# Patient Record
Sex: Male | Born: 1950 | Race: White | Hispanic: No | Smoking: Current every day smoker
Health system: Southern US, Community
[De-identification: ages and names within clinical notes are randomized; demographics above are authoritative.]

## PROBLEM LIST (undated history)

## (undated) DIAGNOSIS — E119 Type 2 diabetes mellitus without complications: Secondary | ICD-10-CM

## (undated) DIAGNOSIS — G709 Myoneural disorder, unspecified: Secondary | ICD-10-CM

## (undated) DIAGNOSIS — M1990 Unspecified osteoarthritis, unspecified site: Secondary | ICD-10-CM

## (undated) DIAGNOSIS — J449 Chronic obstructive pulmonary disease, unspecified: Secondary | ICD-10-CM

## (undated) DIAGNOSIS — Z972 Presence of dental prosthetic device (complete) (partial): Secondary | ICD-10-CM

## (undated) DIAGNOSIS — E785 Hyperlipidemia, unspecified: Secondary | ICD-10-CM

## (undated) DIAGNOSIS — I639 Cerebral infarction, unspecified: Secondary | ICD-10-CM

## (undated) DIAGNOSIS — I10 Essential (primary) hypertension: Secondary | ICD-10-CM

## (undated) DIAGNOSIS — Z973 Presence of spectacles and contact lenses: Secondary | ICD-10-CM

## (undated) DIAGNOSIS — L03116 Cellulitis of left lower limb: Secondary | ICD-10-CM

## (undated) DIAGNOSIS — G4730 Sleep apnea, unspecified: Secondary | ICD-10-CM

## (undated) DIAGNOSIS — K529 Noninfective gastroenteritis and colitis, unspecified: Secondary | ICD-10-CM

## (undated) DIAGNOSIS — M869 Osteomyelitis, unspecified: Secondary | ICD-10-CM

## (undated) DIAGNOSIS — R0600 Dyspnea, unspecified: Secondary | ICD-10-CM

## (undated) DIAGNOSIS — G629 Polyneuropathy, unspecified: Secondary | ICD-10-CM

## (undated) DIAGNOSIS — Z89511 Acquired absence of right leg below knee: Secondary | ICD-10-CM

## (undated) DIAGNOSIS — I739 Peripheral vascular disease, unspecified: Secondary | ICD-10-CM

## (undated) DIAGNOSIS — M069 Rheumatoid arthritis, unspecified: Secondary | ICD-10-CM

## (undated) HISTORY — DX: Myoneural disorder, unspecified: G70.9

## (undated) HISTORY — DX: Noninfective gastroenteritis and colitis, unspecified: K52.9

## (undated) HISTORY — DX: Cellulitis of left lower limb: L03.116

## (undated) HISTORY — PX: COLONOSCOPY: SHX174

## (undated) HISTORY — DX: Rheumatoid arthritis, unspecified: M06.9

## (undated) HISTORY — PX: LUMBAR DISC SURGERY: SHX700

## (undated) HISTORY — DX: Hyperlipidemia, unspecified: E78.5

## (undated) HISTORY — DX: Acquired absence of right leg below knee: Z89.511

## (undated) HISTORY — DX: Unspecified osteoarthritis, unspecified site: M19.90

## (undated) HISTORY — PX: BACK SURGERY: SHX140

## (undated) HISTORY — DX: Osteomyelitis, unspecified: M86.9

---

## 2003-03-05 HISTORY — PX: SCAR REVISION: SHX5285

## 2003-03-05 HISTORY — PX: LEG AMPUTATION BELOW KNEE: SHX694

## 2003-03-05 HISTORY — PX: TOE AMPUTATION: SHX809

## 2003-03-05 HISTORY — PX: FOOT AMPUTATION: SHX951

## 2003-10-16 ENCOUNTER — Emergency Department (HOSPITAL_COMMUNITY): Admission: EM | Admit: 2003-10-16 | Discharge: 2003-10-16 | Payer: Self-pay | Admitting: Emergency Medicine

## 2003-11-10 ENCOUNTER — Inpatient Hospital Stay (HOSPITAL_COMMUNITY): Admission: EM | Admit: 2003-11-10 | Discharge: 2003-11-15 | Payer: Self-pay | Admitting: Family Medicine

## 2003-12-06 ENCOUNTER — Ambulatory Visit: Payer: Self-pay | Admitting: *Deleted

## 2003-12-07 ENCOUNTER — Encounter: Admission: RE | Admit: 2003-12-07 | Discharge: 2003-12-07 | Payer: Self-pay | Admitting: Orthopedic Surgery

## 2003-12-13 ENCOUNTER — Emergency Department (HOSPITAL_COMMUNITY): Admission: EM | Admit: 2003-12-13 | Discharge: 2003-12-13 | Payer: Self-pay | Admitting: Family Medicine

## 2003-12-14 ENCOUNTER — Encounter (INDEPENDENT_AMBULATORY_CARE_PROVIDER_SITE_OTHER): Payer: Self-pay | Admitting: *Deleted

## 2003-12-14 ENCOUNTER — Inpatient Hospital Stay (HOSPITAL_COMMUNITY): Admission: RE | Admit: 2003-12-14 | Discharge: 2003-12-17 | Payer: Self-pay | Admitting: Orthopedic Surgery

## 2004-01-05 ENCOUNTER — Encounter (INDEPENDENT_AMBULATORY_CARE_PROVIDER_SITE_OTHER): Payer: Self-pay | Admitting: Specialist

## 2004-01-05 ENCOUNTER — Inpatient Hospital Stay (HOSPITAL_COMMUNITY): Admission: RE | Admit: 2004-01-05 | Discharge: 2004-01-09 | Payer: Self-pay | Admitting: Orthopedic Surgery

## 2004-02-01 ENCOUNTER — Inpatient Hospital Stay (HOSPITAL_COMMUNITY): Admission: RE | Admit: 2004-02-01 | Discharge: 2004-02-03 | Payer: Self-pay | Admitting: Orthopedic Surgery

## 2004-02-21 ENCOUNTER — Ambulatory Visit: Payer: Self-pay | Admitting: Nurse Practitioner

## 2004-03-07 ENCOUNTER — Observation Stay (HOSPITAL_COMMUNITY): Admission: RE | Admit: 2004-03-07 | Discharge: 2004-03-09 | Payer: Self-pay | Admitting: Orthopedic Surgery

## 2004-03-29 ENCOUNTER — Ambulatory Visit: Payer: Self-pay | Admitting: Nurse Practitioner

## 2004-05-16 ENCOUNTER — Encounter: Admission: RE | Admit: 2004-05-16 | Discharge: 2004-06-11 | Payer: Self-pay | Admitting: Orthopedic Surgery

## 2005-07-26 ENCOUNTER — Emergency Department (HOSPITAL_COMMUNITY): Admission: EM | Admit: 2005-07-26 | Discharge: 2005-07-26 | Payer: Self-pay | Admitting: Family Medicine

## 2005-09-13 ENCOUNTER — Ambulatory Visit: Payer: Self-pay | Admitting: Gastroenterology

## 2005-09-27 ENCOUNTER — Ambulatory Visit: Payer: Self-pay | Admitting: Gastroenterology

## 2005-10-08 ENCOUNTER — Ambulatory Visit: Payer: Self-pay | Admitting: Gastroenterology

## 2006-01-22 ENCOUNTER — Encounter: Admission: RE | Admit: 2006-01-22 | Discharge: 2006-01-22 | Payer: Self-pay | Admitting: Internal Medicine

## 2006-02-18 ENCOUNTER — Encounter (INDEPENDENT_AMBULATORY_CARE_PROVIDER_SITE_OTHER): Payer: Self-pay | Admitting: Cardiology

## 2006-02-18 ENCOUNTER — Ambulatory Visit (HOSPITAL_COMMUNITY): Admission: RE | Admit: 2006-02-18 | Discharge: 2006-02-18 | Payer: Self-pay | Admitting: Internal Medicine

## 2008-07-08 ENCOUNTER — Emergency Department (HOSPITAL_COMMUNITY): Admission: EM | Admit: 2008-07-08 | Discharge: 2008-07-08 | Payer: Self-pay | Admitting: Family Medicine

## 2008-11-09 ENCOUNTER — Encounter (HOSPITAL_BASED_OUTPATIENT_CLINIC_OR_DEPARTMENT_OTHER): Admission: RE | Admit: 2008-11-09 | Discharge: 2009-02-07 | Payer: Self-pay | Admitting: Internal Medicine

## 2010-07-20 NOTE — Op Note (Signed)
NAMEBALRAJ, BRAYFIELD              ACCOUNT NO.:  [id number]   MEDICAL RECORD NO.:  [id number]          PATIENT TYPE:  INP   LOCATION:  2550                         FACILITY:  [facility]   PHYSICIAN:  [name], MD     DATE OF BIRTH:  June 21, 1950   DATE OF PROCEDURE:  02/01/2004  DATE OF DISCHARGE:                                 OPERATIVE REPORT   PREOPERATIVE DIAGNOSIS:  Dehiscence right below-knee amputation.   POSTOPERATIVE DIAGNOSIS:  Dehiscence right below-knee amputation.   OPERATION PERFORMED:  Right below-knee amputation.   SURGEON:  [name], M.D.   ANESTHESIA:  General.   ESTIMATED BLOOD LOSS:  Minimal.   ANTIBIOTICS:  1g Kefzol.   TOURNIQUET TIME:  None.   DISPOSITION:  To post anesthesia care unit in stable condition.   INDICATIONS FOR PROCEDURE:  The patient is a 60 year old gentleman with  peripheral vascular disease, type 2 diabetes, who presents four weeks status  post a right below-knee amputation.  The patient had a good healing and good  consolidation; however, over the weekend, his wound had started to dehisce  and broke down and has exposed tibia and he presents at this time for  revision below-knee amputation. The risks and benefits were discussed  including infection, neurovascular injury, nonhealing of the wound, need for  an above-knee amputation.  The patient states he understands and wishes to  proceed at this time.   DESCRIPTION OF PROCEDURE:  The patient was brought to operating room 4 and  underwent general anesthetic.  After adequate level of anesthesia obtained,  the patient's right lower extremity was prepped using DuraPrep and draped  into a sterile field.  Approximately one inch of the skin was ellipsed out  around the previous incision and this was ellipsed out back to healthy  viable bleeding muscle.  One inch of the distal tibia was resected and one  inch of the distal fibula was resected.  The muscle had good color, good  contractility and good consistency.  All nonviable muscle was removed.  There was no evidence of an abscess or infection.  The wound was irrigated  with normal saline.  Hemostasis was obtained.  The fascial layers were  closed sequentially.  The deep fascial layers were closed using #1 PDS.  The  superficial fascial layers were closed using #1 PDS. The skin was closed  using Proximate staples.  There was no tension on the wound edges.  The  wound was covered with Adaptic orthopedic sponges and a  compressive Ivette Loyal dressing was applied.  The patient was extubated  and taken to PACU in stable condition.  Hemostasis was obtained.  The wound was closed using 2-0 nylon.  The wound  was covered with Adaptic orthopedic sponges, Kerlix and a Coban dressing.  The patient was then taken to PACU in stable condition.      [name]   MVD/MEDQ  D:  02/01/2004  T:  02/01/2004  Job:  [id number]

## 2010-07-20 NOTE — Discharge Summary (Signed)
NAME:  Brian Zavala, Brian Zavala NO.:  [id number]   MEDICAL RECORD NO.:  [id number]                   PATIENT TYPE:  INP   LOCATION:  5036                                 FACILITY:  [facility]   PHYSICIAN:  Michaelyn Barter, M.D.              DATE OF BIRTH:  [birth year]   DATE OF ADMISSION:  11/10/2003  DATE OF DISCHARGE:  11/15/2003                                 DISCHARGE SUMMARY   CHIEF COMPLAINT:  Blister on right foot.   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  He presented to the urgent  care complaining of a blister that developed over his right great toe.  This  was also accompanied by erythema over the right foot.  He denied having any  fever or chills.  He was admitted into the hospital with a diagnosis of  diabetic foot ulcer.  He was started on empiric antibiotics via IV, namely  Zosyn.  An MRI was completed to rule out osteomyelitis.  The final  impression of the MRI, which was completed on November 07, 2003, is there is  a skin nodule which may be some type of blister.  There is surrounding  cellulitis without evidence of osteomyelitis or myofasciitis and no discrete  deep soft tissue abscess is seen.  Therefore, the patient was continued on  Zosyn throughout the course of his hospitalization.  Over the course of his  hospitalization the right foot improved dramatically.  The redness gradually  resolved.  The foot was dressed daily by the nursing staff and his  cellulitis appeared to improve.  Over the course of the patient's  hospitalization, the patient had at least one episode of early morning  hypoglycemia, therefore, the decision was made to hold the patient's  Glucophage.  The patient has been instructed that he should followup with  his primary care physician over the next couple of days for a  re-evaluation  of his sugars and at that time his Glucophage can be reinstituted if  necessary.  At time of discharge, the patient's condition is much improved  in comparison to admission.  Again, his cellulitis of the right foot has  improved remarkably.  He currently has no complaints.  His vitals today:  His temperature is 97.7, heart rate 92, respirations 18, blood pressure  115/72, and his last four CBGs were 77, 117, 96 and 90.  The patient will be  discharged home on the following medications:  1.  Topamax 100 mg 1 tablet twice daily.  2.  Cymbalta 30 mg 1 tablet daily.  3.  Irbesartan 300 mg 1 tablet daily.  4.  Amaryl 4 mg 1 tablet daily.  5.  Ambien 10 mg 1 tablet at bedtime.  6.  Augmentin 875 mg 1 tablet q.12 hours.  7.  Prednisone 2 mg 1 tablet daily.   Again, the patient has been instructed to see his primary care physician,  his appointment is scheduled for Thursday, November 17, 2003, at 9:15 a.m.  Because his sugars were low at certain times throughout his hospitalization,  his Glucophage will be held.  He is instructed to check his sugars at least  three times daily and to consume glucose in the form of juice or other  glucose-containing substance if his sugars are very low.                                                Michaelyn Barter, M.D.    OR/MEDQ  D:  11/15/2003  T:  11/15/2003  Job:  [id number]

## 2010-07-20 NOTE — Discharge Summary (Signed)
NAMECLEOFAS, HUDGINS              ACCOUNT NO.:  [id number]   MEDICAL RECORD NO.:  [id number]          PATIENT TYPE:  INP   LOCATION:  5004                         FACILITY:  [facility]   PHYSICIAN:  [name], MD     DATE OF BIRTH:  05-01-1950   DATE OF ADMISSION:  01/05/2004  DATE OF DISCHARGE:  01/09/2004                                 DISCHARGE SUMMARY   DIAGNOSIS:  Ischemic necrotic right foot.   PROCEDURE:  Right below knee amputation.   DISPOSITION:  Discharged to home in stable condition with home health  physical therapy.   DISCHARGE MEDICATIONS:  Prescriptions for Tylox and Flexeril.   FOLLOW UP:  Follow up in the office two weeks after discharge.   HISTORY OF PRESENT ILLNESS:  Patient is a 60 year old gentleman with  progressive ischemic necrosis of his right foot.  Patient has failed  conservative care and failed foot salvage and presents at this time for a  right below knee amputation.   HOSPITAL COURSE:  The patient's hospital course was essentially  unremarkable.  He underwent a right below knee amputation on January 05, 2004.  He received Kefzol for infection prophylaxis and his tourniquet time  was 10 minutes.  Postoperatively, the patient progressed well with physical  therapy.  He had good range of motion of his knee and he was discharged to  home in stable condition with home health physical therapy on January 09, 2004, with follow-up in the office two weeks after discharge.      [name]   MVD/MEDQ  D:  02/14/2004  T:  02/14/2004  Job:  [id number]

## 2010-07-20 NOTE — Discharge Summary (Signed)
Brian Zavala, Brian Zavala              ACCOUNT NO.:  [id number]   MEDICAL RECORD NO.:  [id number]          PATIENT TYPE:  INP   LOCATION:  5001                         FACILITY:  [facility]   PHYSICIAN:  [name], MD     DATE OF BIRTH:  [birth year]   DATE OF ADMISSION:  12/14/2003  DATE OF DISCHARGE:  12/17/2003                                 DISCHARGE SUMMARY   DISCHARGE DIAGNOSIS:  Abscess and osteomyelitis, right forefoot.   PROCEDURE:  Right first and second ray amputations.   DISPOSITION/CONDITION ON DISCHARGE:  Discharged to home in stable condition.   DISCHARGE MEDICATIONS:  1.  Omnicef antibiotic.  2.  Vicodin for pain.   FOLLOWUP:  Plan to follow up in the office in one week.   HISTORY OF PRESENT ILLNESS:  The patient is a 60 year old gentleman with  diabetic insensate neuropathy and a purulent abscess of the right first and  second rays.  He has failed conservative care with p.o. antibiotics, wound  care and pressure who presents at this time for a first and second ray  amputations.   HOSPITAL COURSE:  The patient's hospital course was essentially  unremarkable.  He underwent a right first and second ray amputation on  December 14, 2003, with an Esmarch tourniquet time at the ankle for  approximately 20 minutes.  He received vancomycin 1 g IV.  Cultures were  obtained x2.  Discharged to the PACU in stable condition.  The patient progressed well.  He was able to ambulate non-weightbearing with  physical therapy.  He was discharged to home with [facility] antibiotic and  physical therapy, for progressive ambulation, non-weightbearing on the right  .  I will plan to follow up in the office in one week after discharge.      [name]   MVD/MEDQ  D:  02/14/2004  T:  02/14/2004  Job:  [id number]

## 2010-07-20 NOTE — Op Note (Signed)
Brian Zavala, Brian Zavala              ACCOUNT NO.:  [id number]   MEDICAL RECORD NO.:  [id number]          PATIENT TYPE:  INP   LOCATION:  5004                         FACILITY:  [facility]   PHYSICIAN:  [name], MD     DATE OF BIRTH:  04/19/1950   DATE OF PROCEDURE:  01/05/2004  DATE OF DISCHARGE:                                 OPERATIVE REPORT   PREOPERATIVE DIAGNOSIS:  Ischemic necrosis of the right foot status post  partial foot amputation.   POSTOPERATIVE DIAGNOSIS:  Ischemic necrosis of the right foot status post  partial foot amputation.   PROCEDURE:  Right below the knee amputation.   SURGEON:  [name], M.D.   ANESTHESIA:  LMA.   ESTIMATED BLOOD LOSS:  Minimal.   ANTIBIOTICS:  1 gram Kefzol.   TOURNIQUET TIME:  10 minutes at 300 mmHg.   DISPOSITION:  To the PACU in stable condition.   INDICATIONS FOR PROCEDURE:  The patient is a 60 year old gentleman type 2  diabetic who has had progressive ischemic necrosis of his right forefoot  status post forefoot partial foot amputation.  The patient has undergone  wound care with progressive ischemic necrosis of the forefoot.  The patient  was discussed surgical options including a continued foot salvage with  additional partial foot amputation versus below the knee amputation.  The  patient states that he wishes to proceed with below the knee amputation and  presents at this time for a right BKA.  The risks and benefits were  discussed including infection, neurovascular injury, persistent pain, need  for additional surgery, nonhealing of the wound.  The patient states he  understands and wishes to proceed at this time.   DESCRIPTION OF PROCEDURE:  The patient was brought to OR room 2 and  underwent a general LMA anesthetic.  After an adequate level of anesthesia  was obtained, the patient's right lower extremity was prepped using  DuraPrep, draped in a sterile field, and the foot was draped into an  impervious  stockinette.  The leg was elevated and the tourniquet inflated  about the thigh to 300 mmHg.  A transverse incision was made at 10 cm distal  to the tibial tubercle, this was carried proximally and a large posterior  flap was created.  The tibia was transected 1 cm proximal to the skin  incision and the fibula was transected 1 cm proximal to the tibial incision.  The soft tissue was then created with a large posterior flap using the  amputation knife.  The sciatic nerve was pulled, cut, and allowed to  retract.  The vascular bundles were suture ligated x 3 each with 2-0 silk.  The tourniquet was deflated after ten  minutes and hemostasis was obtained.  The deep and superficial fascial  layers were closed using #1 PDS.  The skin was closed using approximate  staples.  The wound was covered with Adaptic, orthopedic sponges, sterile  Webril, and a Coban dressing.  The patient was extubated and taken to the  PACU in stable condition.       MVD/MEDQ  D:  01/05/2004  T:  01/05/2004  Job:  [id number]

## 2010-07-20 NOTE — Op Note (Signed)
NAMEELRIC, Brian Zavala              ACCOUNT NO.:  [id number]   MEDICAL RECORD NO.:  [id number]          PATIENT TYPE:  INP   LOCATION:  2899                         FACILITY:  [facility]   PHYSICIAN:  [name], MD     DATE OF BIRTH:  [birth year]   DATE OF PROCEDURE:  03/07/2004  DATE OF DISCHARGE:                                 OPERATIVE REPORT   PREOPERATIVE DIAGNOSIS:  Dehiscent right below-the-knee amputation.   POSTOPERATIVE DIAGNOSIS:  Dehiscent right below-the-knee amputation.   PROCEDURE:  Right below-the-knee amputation revision.   SURGEON:  [name], M.D.   ANESTHESIA:  General.   ESTIMATED BLOOD LOSS:  Minimal.   ANTIBIOTICS:  Kefzol 1 g.   TOURNIQUET TIME:  None.   DISPOSITION:  To PACU in stable condition.  Plan for 23 hours' observation.   INDICATIONS FOR PROCEDURE:  The patient is a 60 year old gentleman who is  status post a right BKA.  The patient has had a wound breakdown and  subsequent dehiscence of the wound, and presents at this time for revision,  right below-the-knee amputation.  The risks and benefits were discussed,  including infection, neurovascular injury, persistent pain, need for higher  level amputation.  The patient stated he understands and wishes to proceed  at this time.   DESCRIPTION OF PROCEDURE:  The patient was brought to the OR room #5 and  underwent a general anesthetic. After an adequate level of anesthesia was  obtained, the patient's right lower extremity was prepped using DuraPrep and  draped into a sterile field.  An incision was made approximately 1 cm  proximal to the previous dehisced incision in a fishmouth type incision.  This was carried down through the muscle, and the necrotic muscle and  necrotic tissue was excised.  There was good bleeding, viable muscle in the  wound, had good color, good contractility and good consistency.   Approximately 1 inch to the tibia and fibula were resected.  Hemostasis was  obtained.  The wound was cleansed and irrigated.  The deep fascial layers  were closed, both deep and superficial fascial layers were closed using #1  PDS,  skin was closed using Approximate staples without any tension on the skin.  The wound was covered with Adaptic, orthopedic sponges, sterile Webril and a  Coban dressing.  The patient was extubated,  taken to the PACU in stable  condition.      [name]   MVD/MEDQ  D:  03/07/2004  T:  03/07/2004  Job:  [id number]

## 2010-07-20 NOTE — H&P (Signed)
NAME:  Brian Zavala, Brian Zavala                        ACCOUNT NO.:  [id number]   MEDICAL RECORD NO.:  [id number]                   PATIENT TYPE:  INP   LOCATION:  5036                                 FACILITY:  [facility]   PHYSICIAN:  Hillery Aldo, M.D.                DATE OF BIRTH:  1950-10-30   DATE OF ADMISSION:  11/10/2003  DATE OF DISCHARGE:                                HISTORY & PHYSICAL   PRIMARY CARE PHYSICIAN:  Erskine Speed, M.D.   CHIEF COMPLAINT:  Blister on right foot.   HISTORY OF PRESENT ILLNESS:  The patient is a 60 year old male with a past  medical history of diabetes for the past 10 years, painful diabetic  neuropathy affecting his lower extremities, rheumatoid arthritis and  hypertension who presented to the Urgent Care after noticing a blistered  area on the right great toe accompanied by right lower extremity foot  erythema.  Denies any accompanying fever or chills.  Denies any recent  injury to the foot.  He has no prior history of diabetic foot ulcerations.  He has not been in the hospital for any reason recently.  Notably, he does  have very painful bilateral lower extremity neuropathy at baseline and has  been on a variety of medications to address this issue.   PAST MEDICAL HISTORY:  1.  Diabetes with neuropathy.  2.  Hypertension.  3.  Rheumatoid arthritis.  4.  History of back surgery times two; the last surgery was in 1990.   ALLERGIES:  No known drug allergies.   MEDICATIONS:  1.  Glucophage 1,000 mg p.o. daily.  2.  Prednisone 2 mg p.o. daily.  3.  Diovan 160 mg p.o. daily.  4.  Topamax 100 mg p.o. b.i.d.  5.  Arava 20 mg p.o. daily.  6.  Amaryl 4 mg p.o. daily.  7.  Cymbalta 30mg  p.o. daily.  8.  Ambien 10 mg p.o. q.h.s.  9.  Alprazolam 1 mg p.o. q.h.s.  10. Remicade q.6 weeks (will be due in one week's time).   SOCIAL HISTORY:  The patient is single.  He has a history of tobacco abuse  of one pack a day for the past 30 years.  He consumes an  average 2-3 beers a  day.  Denies any street drugs.  He is disabled.  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  He has three children.   FAMILY HISTORY:  Father id deceased secondary to cirrhosis.  Mother is alive  and well.  Children are in good health.  No reported medical problems among  his siblings.   REVIEW OF SYSTEMS:  No fever or chills.  Mild weight loss secondary to  Topamax therapy.  No visual complaints.  No ENT or mouth complaints.  No  chest pain or arrhythmia.  No shortness of breath or cough.  No change in  his bowel habits.  No melena or hematochezia.  No dysuria or hematuria.  He  has joint pain and swelling mainly affecting his hands secondary to his  history of rheumatoid arthritis.  He has significant insomnia secondary to  painful neuropathy and increased fatigue secondary to inability to sleep.   LABORATORY DATA:  Laboratory evaluation pending.   PHYSICAL EXAMINATION:  VITAL SIGNS:  Temperature 97.6, pulse 110, blood  pressure 146/89, respirations 22, and O2 saturation 98% on room air.  GENERAL:  Well-developed, well-nourished male in no apparent distress.  HEENT:  Normocephalic and atraumatic.  PERRL.  EOMI.  Oropharynx is clear  with moist mucous membranes.  Questionable mild scleral icterus.  NECK:  Supple.  No thyromegaly.  No lymphadenopathy.  No jugular venous  distention.  CHEST:  Lungs are clear to auscultation bilaterally with good air movement.  HEART:  Regular rate and rhythm.  No murmurs, rubs or gallops.  ABDOMEN:  Soft, nontender and nondistended.  Normoactive bowel sounds.  EXTREMITIES:  The right lower extremity is erythematous to the mid calf with  streaking.  He has a hemorrhagic bulla on the right great toe.  He has a  macerated area under the first metatarsal head.  The foot is diffusely  swollen and erythematous.  He has 2+ dorsalis pedis pulses bilaterally.  There are no lesions appreciable on the left foot.   NEUROLOGIC EXAMINATION:  Decreased sensation to the bilateral lower  extremities, otherwise nonfocal.   ASSESSMENT AND PLAN:  1.  Diabetic foot ulcer.   We will need to rule out osteomyelitis given the macerated area under the  first metatarsal head.  I will therefore order an MRI scan.  Additionally, I  will empirically treat him for the usual polymicrobial sources of infections  in diabetic foot ulcers with Zosyn.  There is no indication to add  vancomycin at this time.  I will also check blood cultures ties two and do a  CBC with differential.   1.  Diabetes.   Patient will continue on his home medications including Glucophage and  Amaryl.  We will check his CBG q.a.c. and h.s.  We will maintain strict  glycemic control.   1.  Neuropathy.   We will continue his usual home medications and in addition add Percocet as  needed for pain.   1.  Rheumatoid arthritis.   We will continue his usual dose of prednisone and Arava.  He also will be  due for Remicade in one week's time.   1.  Hypertension.   We will continue him on his home dose of Diovan.                                                Hillery Aldo, M.D.    CR/MEDQ  D:  11/10/2003  T:  11/11/2003  Job:  [id number]   cc:   Erskine Speed, M.D.  [address] [place]  [place]  [place] [address]  Fax: [phone]

## 2010-07-20 NOTE — Op Note (Signed)
NAME:  SEENA, FACE              ACCOUNT NO.:  [id number]   MEDICAL RECORD NO.:  [id number]          PATIENT TYPE:  OIB   LOCATION:  5001                         FACILITY:  [facility]   PHYSICIAN:  [name], M.D.   DATE OF BIRTH:  [birth year]   DATE OF PROCEDURE:  12/14/2003  DATE OF DISCHARGE:                                 OPERATIVE REPORT   DIAGNOSIS:  Abscess and osteomyelitis, right forefoot.   PROCEDURE:  Right first and second ray amputations.   SURGEON:  [name], M.D.   ANESTHESIA:  General.   ESTIMATED BLOOD LOSS:  Minimal.   TOURNIQUET TIME:  Esmarch at the ankle for approximately 20 minutes.   ANTIBIOTICS:  One gram of vancomycin.   CULTURES:  Cultures obtained x2.   FINDINGS:  Purulent abscess at the MTP joint with necrotic tissue.   DISPOSITION:  To PACU in stable condition.   INDICATION FOR PROCEDURE:  The patient is a 60 year old gentleman with type  2 diabetes who initially presented with insensate neuropathic ulcer under  the 1st metatarsal head, right foot.  The patient was started on p.o.  antibiotics, pressure unloading and wound care.  The patient has  subsequently developed progressive infection with abscess.  He was seen in  the emergency room last night and was given 1 g of Rocephin, and presented  to my office today after evaluating in the emergency room last night.  Examination at this time shows progressive progression of the Wagner grade 1  ulcer to be septic with an abscess.  The patient states he has been running  fevers and having chills.  There is purulent drainage from the great toe MTP  joint and he was emergently admitted to the hospital for the above-mentioned  procedure.  Risks and benefits were discussed including further infection,  neurovascularly intact, non-healing of the wound, need for higher-level  amputation such as a BKA.  The patient states he understands and wishes ot  proceed at this time.   DESCRIPTION OF  PROCEDURE:  The patient was brought to OR 4 and underwent a  general anesthetic.  After adequate level of anesthesia was obtained, the  patient's right lower extremity was prepped using Duraprep and draped into a  sterile field.  The leg was elevated and Esmarch was wrapped around the  ankle for tourniquet control.  A racket incision was made to include the  great toe and the 1st ray.  The 1st ray was amputated through the medial  cuneioform-1st metatarsal base.  There was a purulent abscess within the MTP  joint and cultures were obtained from this purulent abscess.  The necrotic,  nonviable tissue extended to the 2nd metatarsal and the 2nd metatarsal was  also amputated.  The wound was then irrigated with normal saline, hemostasis  was obtained with electrocautery and the patient had viable tissue with no  necrotic tissue within the wound.  The wound was closed without tension on  the skin with 2-0 nylon using a far-near/near-far suture.  The wound was  covered with Adaptic, orthopedic sponges, sterile Webril and a Coban  dressing.  The Esmarch was released.  The patient was extubated and taken to  PACU in stable condition.   Plan for admission with continued IV vancomycin until cultures are  finalized.       MVD/MEDQ  D:  12/14/2003  T:  12/15/2003  Job:  [id number]

## 2012-05-29 ENCOUNTER — Telehealth (INDEPENDENT_AMBULATORY_CARE_PROVIDER_SITE_OTHER): Payer: Self-pay | Admitting: General Surgery

## 2012-05-29 NOTE — Telephone Encounter (Signed)
LMOM asking patient not to come in on 3/31 because AR is in surgery all day and sonja ask me to reschd these patient's...ask the patient to call back on Monday to reschd a time that was good with his schd.Marland KitchenMarland KitchenAR has a clinic on wed 4/2 morning and 4/3 morning...sorry for the inconvenience

## 2012-06-01 ENCOUNTER — Ambulatory Visit (INDEPENDENT_AMBULATORY_CARE_PROVIDER_SITE_OTHER): Payer: Self-pay | Admitting: General Surgery

## 2012-06-08 ENCOUNTER — Encounter (INDEPENDENT_AMBULATORY_CARE_PROVIDER_SITE_OTHER): Payer: Self-pay

## 2012-06-27 ENCOUNTER — Emergency Department (HOSPITAL_COMMUNITY): Payer: Medicare (Managed Care)

## 2012-06-27 ENCOUNTER — Encounter (HOSPITAL_COMMUNITY): Payer: Self-pay | Admitting: Physical Medicine and Rehabilitation

## 2012-06-27 ENCOUNTER — Emergency Department (HOSPITAL_COMMUNITY)
Admission: EM | Admit: 2012-06-27 | Discharge: 2012-06-27 | Disposition: A | Discharge status: Home / Self Care | Payer: Medicare (Managed Care) | Attending: Emergency Medicine | Admitting: Emergency Medicine

## 2012-06-27 DIAGNOSIS — I10 Essential (primary) hypertension: Secondary | ICD-10-CM | POA: Insufficient documentation

## 2012-06-27 DIAGNOSIS — IMO0002 Reserved for concepts with insufficient information to code with codable children: Secondary | ICD-10-CM

## 2012-06-27 DIAGNOSIS — Y9389 Activity, other specified: Secondary | ICD-10-CM | POA: Insufficient documentation

## 2012-06-27 DIAGNOSIS — Z23 Encounter for immunization: Secondary | ICD-10-CM | POA: Insufficient documentation

## 2012-06-27 DIAGNOSIS — E119 Type 2 diabetes mellitus without complications: Secondary | ICD-10-CM | POA: Insufficient documentation

## 2012-06-27 DIAGNOSIS — S81009A Unspecified open wound, unspecified knee, initial encounter: Secondary | ICD-10-CM | POA: Insufficient documentation

## 2012-06-27 DIAGNOSIS — Y92410 Unspecified street and highway as the place of occurrence of the external cause: Secondary | ICD-10-CM | POA: Insufficient documentation

## 2012-06-27 DIAGNOSIS — S91009A Unspecified open wound, unspecified ankle, initial encounter: Secondary | ICD-10-CM | POA: Insufficient documentation

## 2012-06-27 DIAGNOSIS — Z8669 Personal history of other diseases of the nervous system and sense organs: Secondary | ICD-10-CM | POA: Insufficient documentation

## 2012-06-27 HISTORY — DX: Essential (primary) hypertension: I10

## 2012-06-27 HISTORY — DX: Polyneuropathy, unspecified: G62.9

## 2012-06-27 MED ORDER — TETANUS-DIPHTH-ACELL PERTUSSIS 5-2.5-18.5 LF-MCG/0.5 IM SUSP
0.5000 mL | Freq: Once | INTRAMUSCULAR | Status: AC
  Administered 2012-06-27: 0.5 mL via INTRAMUSCULAR
  Filled 2012-06-27: qty 0.5

## 2012-06-27 MED ORDER — CEPHALEXIN 500 MG PO CAPS: 500.0000 mg | ORAL_CAPSULE | Freq: Four times a day (QID) | ORAL | Status: DC
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
## 2012-06-28 NOTE — ED Provider Notes (Signed)
Medical screening examination/treatment/procedure(s) were performed by non-physician practitioner and as supervising physician I was immediately available for consultation/collaboration.  Flint Melter, MD 06/28/12 [phone]

## 2012-11-30 ENCOUNTER — Ambulatory Visit (INDEPENDENT_AMBULATORY_CARE_PROVIDER_SITE_OTHER): Payer: PRIVATE HEALTH INSURANCE | Admitting: Surgery

## 2012-11-30 ENCOUNTER — Encounter (INDEPENDENT_AMBULATORY_CARE_PROVIDER_SITE_OTHER): Payer: Self-pay | Admitting: Surgery

## 2012-11-30 VITALS — BP 100/58 | HR 88 | Temp 97.0°F | Resp 14 | Ht 73.0 in | Wt 202.0 lb

## 2012-11-30 DIAGNOSIS — L720 Epidermal cyst: Secondary | ICD-10-CM | POA: Insufficient documentation

## 2012-11-30 DIAGNOSIS — L723 Sebaceous cyst: Secondary | ICD-10-CM

## 2012-11-30 DIAGNOSIS — L03315 Cellulitis of perineum: Secondary | ICD-10-CM

## 2012-11-30 DIAGNOSIS — L02219 Cutaneous abscess of trunk, unspecified: Secondary | ICD-10-CM

## 2012-11-30 MED ORDER — DOXYCYCLINE HYCLATE 50 MG PO CAPS: 100.0000 mg | ORAL_CAPSULE | Freq: Two times a day (BID) | ORAL | Status: DC

## 2012-11-30 NOTE — Patient Instructions (Addendum)

## 2012-11-30 NOTE — Progress Notes (Signed)
Patient ID: Brian Zavala, male   DOB: 1950/03/30, 62 y.o.   MRN: [id number]  Chief Complaint  Patient presents with  . New Evaluation    eval painful pil cyst/lite drainage    HPI Brian Zavala is a 62 y.o. male.  Patient presents to to draining cysts from just below the scrotum. He has had this problem for a number of months. It swells up and drains. It feels better. The cycle then repeats. He has been off and on antibiotics. Currently, the cyst is draining. No fever chills. Minimal discomfort. HPI  Past Medical History  Diagnosis Date  . Diabetes mellitus without complication   . Hypertension   . Neuropathy   . S/P BKA (below knee amputation)     right  . Arthritis   . Hyperlipidemia   . Neuromuscular disorder     Past Surgical History  Procedure Laterality Date  . Back surgery      ruptured disc  . Leg amputation below knee      right    History reviewed. No pertinent family history.  Social History History  Substance Use Topics  . Smoking status: Former Smoker    Quit date: 01/03/2012  . Smokeless tobacco: Never Used  . Alcohol Use: No    No Known Allergies  Current Outpatient Prescriptions  Medication Sig Dispense Refill  . Calcium Carbonate-Vitamin D (CALCIUM + D PO) Take 1 tablet by mouth daily.      . cholecalciferol (VITAMIN D) 1000 UNITS tablet Take 1,000 Units by mouth daily.      Marland Kitchen etanercept (ENBREL) 50 MG/ML injection Inject 50 mg into the skin every 7 (seven) days. Wednesdays      . glimepiride (AMARYL) 4 MG tablet Take 4 mg by mouth at bedtime.      . insulin glargine (LANTUS) 100 UNIT/ML injection Inject 40 Units into the skin every evening.      . leflunomide (ARAVA) 10 MG tablet Take 10 mg by mouth daily.      . Liraglutide (VICTOZA) 18 MG/3ML SOLN injection Inject 1.8 mg into the skin at bedtime.      Marland Kitchen morphine (MS CONTIN) 100 MG 12 hr tablet Take 100 mg by mouth 2 (two) times daily.      . pregabalin (LYRICA) 100 MG capsule Take  100-200 mg by mouth 2 (two) times daily. 2 caps in the am, 1 cap at bedtime      . rosuvastatin (CRESTOR) 40 MG tablet Take 40 mg by mouth daily.      Marland Kitchen doxycycline (VIBRAMYCIN) 50 MG capsule Take 2 capsules (100 mg total) by mouth 2 (two) times daily.  40 capsule  0   No current facility-administered medications for this visit.    Review of Systems Review of Systems  Constitutional: Negative.   HENT: Negative.   Eyes: Negative.   Respiratory: Negative.   Cardiovascular: Negative.   Gastrointestinal: Negative.   Endocrine: Negative.   Genitourinary: Negative.   Musculoskeletal: Positive for back pain and arthralgias.  Skin: Negative.   Allergic/Immunologic: Negative.   Neurological: Negative.   Hematological: Negative.   Psychiatric/Behavioral: Negative.     Blood pressure 100/58, pulse 88, temperature 97 F (36.1 C), temperature source Temporal, resp. rate 14, height 6\' 1"  (1.854 m), weight 202 lb (91.627 kg).  Physical Exam Physical Exam  Constitutional: He is oriented to person, place, and time. He appears well-developed and well-nourished.  HENT:  Head: Normocephalic and atraumatic.  Eyes: EOM are  normal. Pupils are equal, round, and reactive to light.  Neck: Normal range of motion. Neck supple.  Pulmonary/Chest: Effort normal and breath sounds normal.  Abdominal: Soft. He exhibits no distension.  Genitourinary: Penis normal.     Musculoskeletal: Normal range of motion.  Neurological: He is alert and oriented to person, place, and time.  Skin: Skin is warm and dry.  Psychiatric: He has a normal mood and affect. His behavior is normal. Judgment and thought content normal.    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 This is a chronic problem.The procedure has been discussed  with the patient.  Alternative therapies have been discussed with the patient.  Operative risks include bleeding,  Infection,  Organ injury,  Nerve injury,  Blood vessel injury,  DVT,  Pulmonary embolism,  Death,  And possible reoperation.  Medical management risks include worsening of present situation.  The success of the procedure is 50 -90 % at treating patients symptoms.  The patient understands and agrees to proceed.       Brian A. 11/30/2012, 3:23 PM

## 2012-12-02 ENCOUNTER — Encounter (HOSPITAL_BASED_OUTPATIENT_CLINIC_OR_DEPARTMENT_OTHER): Payer: Self-pay | Admitting: *Deleted

## 2012-12-02 NOTE — Progress Notes (Signed)
To come in for ccs labs and cxr and ekg-diabetic-no resp problems-

## 2012-12-03 ENCOUNTER — Encounter (HOSPITAL_BASED_OUTPATIENT_CLINIC_OR_DEPARTMENT_OTHER)
Admission: RE | Admit: 2012-12-03 | Discharge: 2012-12-03 | Disposition: A | Discharge status: Home / Self Care | Payer: Medicare (Managed Care) | Source: Ambulatory Visit | Attending: Surgery | Admitting: Surgery

## 2012-12-03 ENCOUNTER — Ambulatory Visit
Admission: RE | Admit: 2012-12-03 | Discharge: 2012-12-03 | Disposition: A | Discharge status: Home / Self Care | Payer: Medicare (Managed Care) | Source: Ambulatory Visit | Attending: Surgery | Admitting: Surgery

## 2012-12-03 DIAGNOSIS — Z01812 Encounter for preprocedural laboratory examination: Secondary | ICD-10-CM | POA: Insufficient documentation

## 2012-12-03 DIAGNOSIS — Z01810 Encounter for preprocedural cardiovascular examination: Secondary | ICD-10-CM | POA: Insufficient documentation

## 2012-12-03 DIAGNOSIS — Z01818 Encounter for other preprocedural examination: Secondary | ICD-10-CM | POA: Insufficient documentation

## 2012-12-03 LAB — CBC WITH DIFFERENTIAL/PLATELET
Basophils Absolute: 0 10*3/uL (ref 0.0–0.1)
Basophils Relative: 0 % (ref 0–1)
Eosinophils Absolute: 0.3 10*3/uL (ref 0.0–0.7)
Eosinophils Relative: 4 % (ref 0–5)
HCT: 40.8 % (ref 39.0–52.0)
Hemoglobin: 13.5 g/dL (ref 13.0–17.0)
Lymphocytes Relative: 28 % (ref 12–46)
Lymphs Abs: 2.2 10*3/uL (ref 0.7–4.0)
MCH: 28.7 pg (ref 26.0–34.0)
MCHC: 33.1 g/dL (ref 30.0–36.0)
MCV: 86.8 fL (ref 78.0–100.0)
Monocytes Absolute: 0.8 10*3/uL (ref 0.1–1.0)
Monocytes Relative: 9 % (ref 3–12)
Neutro Abs: 4.8 10*3/uL (ref 1.7–7.7)
Neutrophils Relative %: 59 % (ref 43–77)
Platelets: 321 10*3/uL (ref 150–400)
RBC: 4.7 MIL/uL (ref 4.22–5.81)
RDW: 14.3 % (ref 11.5–15.5)
WBC: 8.2 10*3/uL (ref 4.0–10.5)

## 2012-12-03 LAB — COMPREHENSIVE METABOLIC PANEL
ALT: 34 U/L (ref 0–53)
AST: 25 U/L (ref 0–37)
Albumin: 3.7 g/dL (ref 3.5–5.2)
Alkaline Phosphatase: 65 U/L (ref 39–117)
BUN: 10 mg/dL (ref 6–23)
CO2: 27 mEq/L (ref 19–32)
Calcium: 9.8 mg/dL (ref 8.4–10.5)
Chloride: 94 mEq/L — ABNORMAL LOW (ref 96–112)
Creatinine, Ser: 0.82 mg/dL (ref 0.50–1.35)
GFR calc Af Amer: >90 mL/min (ref >90)
GFR calc non Af Amer: >90 mL/min (ref >90)
Glucose, Bld: 99 mg/dL (ref 70–99)
Potassium: 4.7 mEq/L (ref 3.5–5.1)
Sodium: 135 mEq/L (ref 135–145)
Total Bilirubin: 0.3 mg/dL (ref 0.3–1.2)
Total Protein: 7.9 g/dL (ref 6.0–8.3)

## 2012-12-08 ENCOUNTER — Encounter (HOSPITAL_BASED_OUTPATIENT_CLINIC_OR_DEPARTMENT_OTHER): Payer: Self-pay

## 2012-12-08 ENCOUNTER — Encounter (HOSPITAL_BASED_OUTPATIENT_CLINIC_OR_DEPARTMENT_OTHER): Payer: Self-pay | Admitting: Certified Registered Nurse Anesthetist

## 2012-12-08 ENCOUNTER — Other Ambulatory Visit (INDEPENDENT_AMBULATORY_CARE_PROVIDER_SITE_OTHER): Payer: Self-pay

## 2012-12-08 ENCOUNTER — Telehealth (INDEPENDENT_AMBULATORY_CARE_PROVIDER_SITE_OTHER): Payer: Self-pay | Admitting: *Deleted

## 2012-12-08 ENCOUNTER — Ambulatory Visit (HOSPITAL_BASED_OUTPATIENT_CLINIC_OR_DEPARTMENT_OTHER): Payer: Medicare (Managed Care) | Admitting: Certified Registered Nurse Anesthetist

## 2012-12-08 ENCOUNTER — Encounter (HOSPITAL_BASED_OUTPATIENT_CLINIC_OR_DEPARTMENT_OTHER)
Admission: RE | Disposition: A | Discharge status: Home / Self Care | Payer: Self-pay | Source: Ambulatory Visit | Attending: Surgery

## 2012-12-08 ENCOUNTER — Ambulatory Visit (HOSPITAL_BASED_OUTPATIENT_CLINIC_OR_DEPARTMENT_OTHER)
Admission: RE | Admit: 2012-12-08 | Discharge: 2012-12-08 | Disposition: A | Discharge status: Home / Self Care | Payer: Medicare (Managed Care) | Source: Ambulatory Visit | Attending: Surgery | Admitting: Surgery

## 2012-12-08 DIAGNOSIS — L03315 Cellulitis of perineum: Secondary | ICD-10-CM

## 2012-12-08 DIAGNOSIS — E785 Hyperlipidemia, unspecified: Secondary | ICD-10-CM | POA: Insufficient documentation

## 2012-12-08 DIAGNOSIS — L720 Epidermal cyst: Secondary | ICD-10-CM

## 2012-12-08 DIAGNOSIS — Z794 Long term (current) use of insulin: Secondary | ICD-10-CM | POA: Insufficient documentation

## 2012-12-08 DIAGNOSIS — I10 Essential (primary) hypertension: Secondary | ICD-10-CM | POA: Insufficient documentation

## 2012-12-08 DIAGNOSIS — G589 Mononeuropathy, unspecified: Secondary | ICD-10-CM | POA: Insufficient documentation

## 2012-12-08 DIAGNOSIS — M129 Arthropathy, unspecified: Secondary | ICD-10-CM | POA: Insufficient documentation

## 2012-12-08 DIAGNOSIS — Z01810 Encounter for preprocedural cardiovascular examination: Secondary | ICD-10-CM | POA: Insufficient documentation

## 2012-12-08 DIAGNOSIS — X58XXXA Exposure to other specified factors, initial encounter: Secondary | ICD-10-CM | POA: Insufficient documentation

## 2012-12-08 DIAGNOSIS — S88119A Complete traumatic amputation at level between knee and ankle, unspecified lower leg, initial encounter: Secondary | ICD-10-CM | POA: Insufficient documentation

## 2012-12-08 DIAGNOSIS — E119 Type 2 diabetes mellitus without complications: Secondary | ICD-10-CM | POA: Insufficient documentation

## 2012-12-08 DIAGNOSIS — Z87891 Personal history of nicotine dependence: Secondary | ICD-10-CM | POA: Insufficient documentation

## 2012-12-08 DIAGNOSIS — IMO0002 Reserved for concepts with insufficient information to code with codable children: Secondary | ICD-10-CM | POA: Insufficient documentation

## 2012-12-08 DIAGNOSIS — Z79899 Other long term (current) drug therapy: Secondary | ICD-10-CM | POA: Insufficient documentation

## 2012-12-08 DIAGNOSIS — Z01812 Encounter for preprocedural laboratory examination: Secondary | ICD-10-CM | POA: Insufficient documentation

## 2012-12-08 DIAGNOSIS — L02219 Cutaneous abscess of trunk, unspecified: Secondary | ICD-10-CM | POA: Insufficient documentation

## 2012-12-08 DIAGNOSIS — L089 Local infection of the skin and subcutaneous tissue, unspecified: Secondary | ICD-10-CM

## 2012-12-08 HISTORY — DX: Presence of dental prosthetic device (complete) (partial): Z97.2

## 2012-12-08 HISTORY — PX: CYST EXCISION PERINEAL: SHX6278

## 2012-12-08 HISTORY — DX: Presence of spectacles and contact lenses: Z97.3

## 2012-12-08 LAB — GLUCOSE, CAPILLARY
Glucose-Capillary: 227 mg/dL — ABNORMAL HIGH (ref 70–99)
Glucose-Capillary: 248 mg/dL — ABNORMAL HIGH (ref 70–99)

## 2012-12-08 SURGERY — EXCISION, CYST, PERINEUM
Anesthesia: General | Site: Perineum | Wound class: Dirty or Infected

## 2012-12-08 MED ORDER — METOCLOPRAMIDE HCL 5 MG/ML IJ SOLN: 10.0000 mg | Freq: Once | INTRAMUSCULAR | Status: DC | PRN

## 2012-12-08 MED ORDER — FENTANYL CITRATE 0.05 MG/ML IJ SOLN
INTRAMUSCULAR | Status: DC | PRN
  Administered 2012-12-08: 25 ug via INTRAVENOUS
  Administered 2012-12-08: 50 ug via INTRAVENOUS

## 2012-12-08 MED ORDER — BUPIVACAINE-EPINEPHRINE 0.25% -1:200000 IJ SOLN
INTRAMUSCULAR | Status: DC | PRN
  Administered 2012-12-08: 10 mL

## 2012-12-08 MED ORDER — LIDOCAINE HCL (CARDIAC) 20 MG/ML IV SOLN
INTRAVENOUS | Status: DC | PRN
  Administered 2012-12-08: 60 mg via INTRAVENOUS

## 2012-12-08 MED ORDER — MIDAZOLAM HCL 5 MG/5ML IJ SOLN
INTRAMUSCULAR | Status: DC | PRN
  Administered 2012-12-08: 1 mg via INTRAVENOUS

## 2012-12-08 MED ORDER — OXYCODONE HCL 5 MG PO TABS: 5.0000 mg | ORAL_TABLET | Freq: Once | ORAL | Status: DC | PRN

## 2012-12-08 MED ORDER — HYDROMORPHONE HCL PF 1 MG/ML IJ SOLN: 0.2500 mg | INTRAMUSCULAR | Status: DC | PRN

## 2012-12-08 MED ORDER — DEXTROSE 5 % IV SOLN
3.0000 g | INTRAVENOUS | Status: AC
  Administered 2012-12-08: 2 g via INTRAVENOUS

## 2012-12-08 MED ORDER — CHLORHEXIDINE GLUCONATE 4 % EX LIQD: 1.0000 "application " | Freq: Once | CUTANEOUS | Status: DC

## 2012-12-08 MED ORDER — EPHEDRINE SULFATE 50 MG/ML IJ SOLN
INTRAMUSCULAR | Status: DC | PRN
  Administered 2012-12-08 (×2): 10 mg via INTRAVENOUS

## 2012-12-08 MED ORDER — LACTATED RINGERS IV SOLN
INTRAVENOUS | Status: DC
  Administered 2012-12-08: 20 mL/h via INTRAVENOUS
  Administered 2012-12-08: 08:00:00 via INTRAVENOUS

## 2012-12-08 MED ORDER — FENTANYL CITRATE 0.05 MG/ML IJ SOLN: 50.0000 ug | INTRAMUSCULAR | Status: DC | PRN

## 2012-12-08 MED ORDER — PROPOFOL 10 MG/ML IV BOLUS
INTRAVENOUS | Status: DC | PRN
  Administered 2012-12-08: 200 mg via INTRAVENOUS

## 2012-12-08 MED ORDER — OXYCODONE HCL 5 MG/5ML PO SOLN
5.0000 mg | Freq: Once | ORAL | Status: DC | PRN
Start: 2012-12-08 — End: 2012-12-08

## 2012-12-08 MED ORDER — ONDANSETRON HCL 4 MG/2ML IJ SOLN
INTRAMUSCULAR | Status: DC | PRN
  Administered 2012-12-08: 4 mg via INTRAMUSCULAR

## 2012-12-08 MED ORDER — MIDAZOLAM HCL 2 MG/2ML IJ SOLN: 1.0000 mg | INTRAMUSCULAR | Status: DC | PRN

## 2012-12-08 MED ORDER — METOCLOPRAMIDE HCL 5 MG/ML IJ SOLN
INTRAMUSCULAR | Status: DC | PRN
  Administered 2012-12-08: 10 mg via INTRAVENOUS

## 2012-12-08 SURGICAL SUPPLY — 31 items
BLADE SURG 15 STRL LF DISP TIS (BLADE) ×1 IMPLANT
BLADE SURG 15 STRL SS (BLADE) ×1
BRIEF STRETCH FOR OB PAD LRG (UNDERPADS AND DIAPERS) IMPLANT
CANISTER SUCTION 1200CC (MISCELLANEOUS) IMPLANT
COVER MAYO STAND STRL (DRAPES) IMPLANT
DECANTER SPIKE VIAL GLASS SM (MISCELLANEOUS) IMPLANT
DRAPE UTILITY XL STRL (DRAPES) ×2 IMPLANT
DRSG PAD ABDOMINAL 8X10 ST (GAUZE/BANDAGES/DRESSINGS) IMPLANT
ELECT REM PT RETURN 9FT ADLT (ELECTROSURGICAL)
ELECTRODE REM PT RTRN 9FT ADLT (ELECTROSURGICAL) IMPLANT
GAUZE SPONGE 4X4 12PLY STRL LF (GAUZE/BANDAGES/DRESSINGS) ×2 IMPLANT
GLOVE BIOGEL PI IND STRL 8 (GLOVE) ×1 IMPLANT
GLOVE BIOGEL PI INDICATOR 8 (GLOVE) ×1
GLOVE ECLIPSE 8.0 STRL XLNG CF (GLOVE) ×2 IMPLANT
GOWN PREVENTION PLUS XLARGE (GOWN DISPOSABLE) ×4 IMPLANT
NEEDLE HYPO 25X1 1.5 SAFETY (NEEDLE) ×2 IMPLANT
PACK BASIN DAY SURGERY FS (CUSTOM PROCEDURE TRAY) ×2 IMPLANT
PACK LITHOTOMY IV (CUSTOM PROCEDURE TRAY) ×2 IMPLANT
PENCIL BUTTON HOLSTER BLD 10FT (ELECTRODE) IMPLANT
SHEET MEDIUM DRAPE 40X70 STRL (DRAPES) IMPLANT
SPONGE SURGIFOAM ABS GEL 12-7 (HEMOSTASIS) IMPLANT
SURGILUBE 2OZ TUBE FLIPTOP (MISCELLANEOUS) IMPLANT
SUT CHROMIC 3 0 SH 27 (SUTURE) IMPLANT
SYR BULB 3OZ (MISCELLANEOUS) ×2 IMPLANT
SYR CONTROL 10ML LL (SYRINGE) ×2 IMPLANT
TOWEL OR 17X24 6PK STRL BLUE (TOWEL DISPOSABLE) ×4 IMPLANT
TOWEL OR NON WOVEN STRL DISP B (DISPOSABLE) ×2 IMPLANT
TRAY DSU PREP LF (CUSTOM PROCEDURE TRAY) ×2 IMPLANT
TUBE CONNECTING 20X1/4 (TUBING) IMPLANT
UNDERPAD 30X30 INCONTINENT (UNDERPADS AND DIAPERS) ×2 IMPLANT
YANKAUER SUCT BULB TIP NO VENT (SUCTIONS) IMPLANT

## 2012-12-08 NOTE — Telephone Encounter (Signed)
Per discharge instructions and HHN orders, daily wet to dry dressing changes. LM on MaryAnn VM with orders.

## 2012-12-08 NOTE — H&P (View-Only) (Signed)
Patient ID: Brian Zavala, male   DOB: [birth year], 62 y.o.   MRN: [id number]  Chief Complaint  Patient presents with  . New Evaluation    eval painful pil cyst/lite drainage    HPI Brian Zavala is a 62 y.o. male.  Patient presents to to draining cysts from just below the scrotum. He has had this problem for a number of months. It swells up and drains. It feels better. The cycle then repeats. He has been off and on antibiotics. Currently, the cyst is draining. No fever chills. Minimal discomfort. HPI  Past Medical History  Diagnosis Date  . Diabetes mellitus without complication   . Hypertension   . Neuropathy   . S/P BKA (below knee amputation)     right  . Arthritis   . Hyperlipidemia   . Neuromuscular disorder     Past Surgical History  Procedure Laterality Date  . Back surgery      ruptured disc  . Leg amputation below knee      right    History reviewed. No pertinent family history.  Social History History  Substance Use Topics  . Smoking status: Former Smoker    Quit date: 01/03/2012  . Smokeless tobacco: Never Used  . Alcohol Use: No    No Known Allergies  Current Outpatient Prescriptions  Medication Sig Dispense Refill  . Calcium Carbonate-Vitamin D (CALCIUM + D PO) Take 1 tablet by mouth daily.      . cholecalciferol (VITAMIN D) 1000 UNITS tablet Take 1,000 Units by mouth daily.      Marland Kitchen etanercept (ENBREL) 50 MG/ML injection Inject 50 mg into the skin every 7 (seven) days. Wednesdays      . glimepiride (AMARYL) 4 MG tablet Take 4 mg by mouth at bedtime.      . insulin glargine (LANTUS) 100 UNIT/ML injection Inject 40 Units into the skin every evening.      . leflunomide (ARAVA) 10 MG tablet Take 10 mg by mouth daily.      . Liraglutide (VICTOZA) 18 MG/3ML SOLN injection Inject 1.8 mg into the skin at bedtime.      Marland Kitchen morphine (MS CONTIN) 100 MG 12 hr tablet Take 100 mg by mouth 2 (two) times daily.      . pregabalin (LYRICA) 100 MG capsule Take  100-200 mg by mouth 2 (two) times daily. 2 caps in the am, 1 cap at bedtime      . rosuvastatin (CRESTOR) 40 MG tablet Take 40 mg by mouth daily.      Marland Kitchen doxycycline (VIBRAMYCIN) 50 MG capsule Take 2 capsules (100 mg total) by mouth 2 (two) times daily.  40 capsule  0   No current facility-administered medications for this visit.    Review of Systems Review of Systems  Constitutional: Negative.   HENT: Negative.   Eyes: Negative.   Respiratory: Negative.   Cardiovascular: Negative.   Gastrointestinal: Negative.   Endocrine: Negative.   Genitourinary: Negative.   Musculoskeletal: Positive for back pain and arthralgias.  Skin: Negative.   Allergic/Immunologic: Negative.   Neurological: Negative.   Hematological: Negative.   Psychiatric/Behavioral: Negative.     Blood pressure 100/58, pulse 88, temperature 97 F (36.1 C), temperature source Temporal, resp. rate 14, height 6\' 1"  (1.854 m), weight 202 lb (91.627 kg).  Physical Exam Physical Exam  Constitutional: He is oriented to person, place, and time. He appears well-developed and well-nourished.  HENT:  Head: Normocephalic and atraumatic.  Eyes: EOM are  normal. Pupils are equal, round, and reactive to light.  Neck: Normal range of motion. Neck supple.  Pulmonary/Chest: Effort normal and breath sounds normal.  Abdominal: Soft. He exhibits no distension.  Genitourinary: Penis normal.     Musculoskeletal: Normal range of motion.  Neurological: He is alert and oriented to person, place, and time.  Skin: Skin is warm and dry.  Psychiatric: He has a normal mood and affect. His behavior is normal. Judgment and thought content normal.    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 This is a chronic problem.The procedure has been discussed  with the patient.  Alternative therapies have been discussed with the patient.  Operative risks include bleeding,  Infection,  Organ injury,  Nerve injury,  Blood vessel injury,  DVT,  Pulmonary embolism,  Death,  And possible reoperation.  Medical management risks include worsening of present situation.  The success of the procedure is 50 -90 % at treating patients symptoms.  The patient understands and agrees to proceed.       CORNETT,THOMAS A. 11/30/2012, 3:23 PM

## 2012-12-08 NOTE — Telephone Encounter (Signed)
MaryAnn NP with [facility] called to ask about wound care and dressing change directions for patient.  She states patient told her it is suppose to be wet to dry however she needs to know from the surgeon.  Explained that I would send a message to Dr. Luisa Hart to ask and then give her a call back as soon as I know.  She states understanding and agreeable at this time.

## 2012-12-08 NOTE — Interval H&P Note (Signed)
History and Physical Interval Note:  12/08/2012 8:18 AM  Brian Zavala  has presented today for surgery, with the diagnosis of cyst perineum  The various methods of treatment have been discussed with the patient and family. After consideration of risks, benefits and other options for treatment, the patient has consented to  Procedure(s): CYST EXCISION PERINeum (N/A) as a surgical intervention .  The patient's history has been reviewed, patient examined, no change in status, stable for surgery.  I have reviewed the patient's chart and labs.  Questions were answered to the patient's satisfaction.     CORNETT,THOMAS A.

## 2012-12-08 NOTE — Anesthesia Postprocedure Evaluation (Signed)
Anesthesia Post Note  Patient: Brian Zavala  Procedure(s) Performed: Procedure(s) (LRB): CYST EXCISION PERINeum (N/A)  Anesthesia type: General  Patient location: PACU  Post pain: Pain level controlled  Post assessment: Patient's Cardiovascular Status Stable  Last Vitals:  Filed Vitals:   12/08/12 0945  BP: 94/59  Pulse: 102  Temp:   Resp: 17    Post vital signs: Reviewed and stable  Level of consciousness: alert  Complications: No apparent anesthesia complications

## 2012-12-08 NOTE — Anesthesia Preprocedure Evaluation (Addendum)
Anesthesia Evaluation  Patient identified by MRN, date of birth, ID band Patient awake    Reviewed: Allergy & Precautions, H&P , NPO status , Patient's Chart, lab work & pertinent test results, reviewed documented beta blocker date and time   Airway Mallampati: II TM Distance: >3 FB Neck ROM: full    Dental   Pulmonary neg pulmonary ROS,  breath sounds clear to auscultation        Cardiovascular hypertension, Pt. on medications Rhythm:regular     Neuro/Psych  Neuromuscular disease negative psych ROS   GI/Hepatic negative GI ROS, Neg liver ROS,   Endo/Other  diabetes, Type 2, Insulin Dependent  Renal/GU negative Renal ROS  negative genitourinary   Musculoskeletal   Abdominal   Peds  Hematology negative hematology ROS (+)   Anesthesia Other Findings See surgeon's H&P   Reproductive/Obstetrics negative OB ROS                           Anesthesia Physical Anesthesia Plan  ASA: III  Anesthesia Plan: General   Post-op Pain Management:    Induction: Intravenous  Airway Management Planned: LMA  Additional Equipment:   Intra-op Plan:   Post-operative Plan:   Informed Consent: I have reviewed the patients History and Physical, chart, labs and discussed the procedure including the risks, benefits and alternatives for the proposed anesthesia with the patient or authorized representative who has indicated his/her understanding and acceptance.   Dental Advisory Given  Plan Discussed with: CRNA and Surgeon  Anesthesia Plan Comments:         Anesthesia Quick Evaluation

## 2012-12-08 NOTE — Transfer of Care (Signed)
Immediate Anesthesia Transfer of Care Note  Patient: Brian Zavala  Procedure(s) Performed: Procedure(s): CYST EXCISION PERINeum (N/A)  Patient Location: PACU  Anesthesia Type:General  Level of Consciousness: awake and patient cooperative  Airway & Oxygen Therapy: Patient Spontanous Breathing and Patient connected to face mask oxygen  Post-op Assessment: Report given to PACU RN and Post -op Vital signs reviewed and stable  Post vital signs: Reviewed and stable  Complications: No apparent anesthesia complications

## 2012-12-08 NOTE — Brief Op Note (Signed)
12/08/2012  8:56 AM  PATIENT:  Brian Zavala  62 y.o. male  PRE-OPERATIVE DIAGNOSIS:  cyst perineum  POST-OPERATIVE DIAGNOSIS:  cyst perineum  PROCEDURE:  Procedure(s): CYST EXCISION PERINeum (N/A) and debridement 5 cm x 2 cm x 0.5 cm skin and subcutaneous tissue  SURGEON:  Surgeon(s) and Role:    * Thomas A. Cornett, MD - Primary  PHYSICIAN ASSISTANT:   ASSISTANTS: none   ANESTHESIA:   local and general  EBL:   minimal  BLOOD ADMINISTERED:none  DRAINS: none   LOCAL MEDICATIONS USED:  BUPIVICAINE   SPECIMEN:  No Specimen  DISPOSITION OF SPECIMEN:  N/A  COUNTS:  YES  TOURNIQUET:  * No tourniquets in log *  DICTATION: .Other Dictation: Dictation Number [id number]  PLAN OF CARE: Discharge to home after PACU  PATIENT DISPOSITION:  PACU - hemodynamically stable.   Delay start of Pharmacological VTE agent (>24hrs) due to surgical blood loss or risk of bleeding: not applicable

## 2012-12-08 NOTE — Anesthesia Procedure Notes (Signed)
Procedure Name: LMA Insertion Date/Time: 12/08/2012 8:34 AM Performed by: BLOCKER, TIMOTHY D Pre-anesthesia Checklist: Patient identified, Emergency Drugs available, Suction available and Patient being monitored Patient Re-evaluated:Patient Re-evaluated prior to inductionOxygen Delivery Method: Circle System Utilized Preoxygenation: Pre-oxygenation with 100% oxygen Intubation Type: IV induction Ventilation: Mask ventilation without difficulty LMA: LMA inserted LMA Size: 5.0 Number of attempts: 1 Placement Confirmation: positive ETCO2 Tube secured with: Tape Dental Injury: Teeth and Oropharynx as per pre-operative assessment

## 2012-12-09 ENCOUNTER — Encounter (HOSPITAL_BASED_OUTPATIENT_CLINIC_OR_DEPARTMENT_OTHER): Payer: Self-pay | Admitting: Surgery

## 2012-12-09 NOTE — Op Note (Signed)
Brian Zavala, Brian Zavala              ACCOUNT NO.:  [id number]  MEDICAL RECORD NO.:  [id number]  LOCATION:                                 FACILITY:  PHYSICIAN:  Thomas A. Cornett, M.D.DATE OF BIRTH:  09/09/1950  DATE OF PROCEDURE:  12/08/2012 DATE OF DISCHARGE:                              OPERATIVE REPORT   PREOPERATIVE DIAGNOSIS:  Draining sinus tract and cyst, right perineum.  POSTOPERATIVE DIAGNOSIS:  Sinus tract involving right perineum entering into open chronic wound cavity measuring 5 cm x 2 cm x 0.5 cm.  PROCEDURE:  Debridement of skin and subcutaneous tissue perineum measuring 5 cm x 2 cm x 0.5 cm with viable skin.  No fibrinous exudate. Healthy open wound cavity with excellent granulation tissue and no necrotic debris.  SURGEON:  Maisie Fus A. Cornett, MD  ANESTHESIA:  LMA with 0.25% Sensorcaine local.  EBL:  Minimal.  DRAINS:  None.  SPECIMENS:  None.  INDICATIONS FOR PROCEDURE:  The patient is a 62 year old male with diabetes mellitus.  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 I recommended debridement and excision of this in an attempt to get this to heal.  The risks, benefits, and alternative therapies were discussed. The risks include, but not exclusive of bleeding, infection, nerve injury, blood vessel injury, nonhealing wound, extension of infection to other places, the need for other types of therapies to get it to heal. He understood the above and agreed to proceed.  DESCRIPTION OF PROCEDURE:  The patient was met in the holding area and questions were answered.  He was taken back to the operating room and placed supine on the OR table where general anesthesia was initiated. He was then placed in lithotomy and appropriately padded.  Perineum was then prepped and draped in sterile fashion.  The sinus tract was at the base of his right hemiscrotum.  One I was able to press on  this there was some dark fluid that could be expressed.  I placed a probe into a large well defined cavity and extended towards right by about 4 cm and then toward the anal canal by about 2.  This was not communication with the anal canal was at least 4 cm from the anal verge.  I used a cautery over the probe and opened up the cavity.  This was a very clean cavity with excellent granulation tissue.  I used cautery to cauterize the excess granulation tissue since this appeared to be a chronic problem. This did not extend toward the anus and was not a fistula in ANO.  This was very clean and then after achieving hemostasis I packed it with saline-soaked gauze.  Dry dressing was applied.  Total area of skin debridement was approximately 2 cm.  The skin was viable with no necrotic debris.  Granulation tissue in the wound was 100%.  Dry dressings were applied.  He was taken to lithotomy.  He was extubated and taken to recovery room in satisfactory condition.     Thomas A. Cornett, M.D.     TAC/MEDQ  D:  12/08/2012  T:  12/09/2012  Job:  [id number]

## 2012-12-10 ENCOUNTER — Telehealth (INDEPENDENT_AMBULATORY_CARE_PROVIDER_SITE_OTHER): Payer: Self-pay | Admitting: *Deleted

## 2012-12-10 NOTE — Telephone Encounter (Signed)
Spoke with Jan from [facility] and she states that patient is receiving his dressing changes daily by him coming to the office and the nurse changing it there.

## 2012-12-15 ENCOUNTER — Telehealth (INDEPENDENT_AMBULATORY_CARE_PROVIDER_SITE_OTHER): Payer: Self-pay

## 2012-12-15 NOTE — Telephone Encounter (Signed)
error 

## 2012-12-15 NOTE — Telephone Encounter (Signed)
Message copied by Brennan Bailey on Tue Dec 15, 2012  3:58 PM ------      Message from: [place], [place]      Created: Fri Dec 11, 2012  2:44 PM       This patient needs a po appt in 2-3 weeks from his sx date 12/08/12.  I didn't see anything open except new pt spots. Please make him an appt and let him know. :)            This patient will also need a PO appt in 2-3 weeks from her sx 12/11/12.  MRN # [id number]            Penni Bombard ------

## 2012-12-21 ENCOUNTER — Ambulatory Visit (INDEPENDENT_AMBULATORY_CARE_PROVIDER_SITE_OTHER): Payer: PRIVATE HEALTH INSURANCE | Admitting: Surgery

## 2012-12-21 ENCOUNTER — Encounter (INDEPENDENT_AMBULATORY_CARE_PROVIDER_SITE_OTHER): Payer: Self-pay | Admitting: Surgery

## 2012-12-21 VITALS — BP 126/78 | HR 68 | Temp 97.4°F | Resp 14 | Ht 73.5 in | Wt 199.4 lb

## 2012-12-21 DIAGNOSIS — Z9889 Other specified postprocedural states: Secondary | ICD-10-CM

## 2012-12-21 NOTE — Patient Instructions (Signed)
Continue to pack.  May want to purchase Medipore tape or use mesh panties.

## 2012-12-21 NOTE — Progress Notes (Signed)
Patient returns after followup of perineal debridement of abscess. He is doing well.  Exam: The patient is afebrile. Perineal wound measures 3 x 2 cm location right hemiscrotum. It is about a centimeter deep. Clean.  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 Continue wet to dry dressing changes daily. Recommend using Medipore tape or mesh panties. Return in 4 weeks.

## 2013-01-22 ENCOUNTER — Encounter (INDEPENDENT_AMBULATORY_CARE_PROVIDER_SITE_OTHER): Payer: Self-pay | Admitting: Surgery

## 2013-01-22 ENCOUNTER — Ambulatory Visit (INDEPENDENT_AMBULATORY_CARE_PROVIDER_SITE_OTHER): Payer: PRIVATE HEALTH INSURANCE | Admitting: Surgery

## 2013-01-22 VITALS — BP 126/72 | HR 68 | Temp 98.4°F | Resp 18 | Ht 73.5 in | Wt 194.0 lb

## 2013-01-22 DIAGNOSIS — L03315 Cellulitis of perineum: Secondary | ICD-10-CM

## 2013-01-22 DIAGNOSIS — L02219 Cutaneous abscess of trunk, unspecified: Secondary | ICD-10-CM

## 2013-01-22 NOTE — Patient Instructions (Signed)
Cover with dry dressing.  Keep off tailbone and add powder to keep area dry.

## 2013-01-22 NOTE — Progress Notes (Signed)
Subjective:     Patient ID: Brian Zavala, male   DOB: 1950/07/04, 62 y.o.   MRN: [id number]  HPI Pt returns for follow up of perineal wound and a rash over his tailbone.  Doing ok otherwise.   Review of Systems  Constitutional: Negative for chills, diaphoresis and fatigue.       Objective:   Physical Exam  Genitourinary:          Assessment:     Perineal wound getting smaller Pressure changes over sacrum    Plan:     Keep pressure of coccyx Wound improving.  Stop packing.  Cover with dry dressing.   Return 6 weeks.

## 2013-03-08 ENCOUNTER — Encounter (INDEPENDENT_AMBULATORY_CARE_PROVIDER_SITE_OTHER): Payer: PRIVATE HEALTH INSURANCE | Admitting: Surgery

## 2013-07-28 ENCOUNTER — Emergency Department (HOSPITAL_COMMUNITY): Payer: Medicare (Managed Care)

## 2013-07-28 ENCOUNTER — Emergency Department (HOSPITAL_COMMUNITY)
Admission: EM | Admit: 2013-07-28 | Discharge: 2013-07-28 | Disposition: A | Discharge status: Home / Self Care | Payer: Medicare (Managed Care) | Attending: Emergency Medicine | Admitting: Emergency Medicine

## 2013-07-28 ENCOUNTER — Encounter (HOSPITAL_COMMUNITY): Payer: Self-pay | Admitting: Emergency Medicine

## 2013-07-28 DIAGNOSIS — S98139A Complete traumatic amputation of one unspecified lesser toe, initial encounter: Secondary | ICD-10-CM | POA: Insufficient documentation

## 2013-07-28 DIAGNOSIS — R1084 Generalized abdominal pain: Secondary | ICD-10-CM | POA: Insufficient documentation

## 2013-07-28 DIAGNOSIS — M129 Arthropathy, unspecified: Secondary | ICD-10-CM | POA: Insufficient documentation

## 2013-07-28 DIAGNOSIS — Z7982 Long term (current) use of aspirin: Secondary | ICD-10-CM | POA: Insufficient documentation

## 2013-07-28 DIAGNOSIS — Z87891 Personal history of nicotine dependence: Secondary | ICD-10-CM | POA: Insufficient documentation

## 2013-07-28 DIAGNOSIS — I10 Essential (primary) hypertension: Secondary | ICD-10-CM | POA: Insufficient documentation

## 2013-07-28 DIAGNOSIS — R112 Nausea with vomiting, unspecified: Secondary | ICD-10-CM | POA: Insufficient documentation

## 2013-07-28 DIAGNOSIS — S98919A Complete traumatic amputation of unspecified foot, level unspecified, initial encounter: Secondary | ICD-10-CM | POA: Insufficient documentation

## 2013-07-28 DIAGNOSIS — R000 Tachycardia, unspecified: Secondary | ICD-10-CM | POA: Insufficient documentation

## 2013-07-28 DIAGNOSIS — E785 Hyperlipidemia, unspecified: Secondary | ICD-10-CM | POA: Insufficient documentation

## 2013-07-28 DIAGNOSIS — S88119A Complete traumatic amputation at level between knee and ankle, unspecified lower leg, initial encounter: Secondary | ICD-10-CM | POA: Insufficient documentation

## 2013-07-28 DIAGNOSIS — Z79899 Other long term (current) drug therapy: Secondary | ICD-10-CM | POA: Insufficient documentation

## 2013-07-28 DIAGNOSIS — R109 Unspecified abdominal pain: Secondary | ICD-10-CM

## 2013-07-28 DIAGNOSIS — E119 Type 2 diabetes mellitus without complications: Secondary | ICD-10-CM | POA: Insufficient documentation

## 2013-07-28 DIAGNOSIS — Z794 Long term (current) use of insulin: Secondary | ICD-10-CM | POA: Insufficient documentation

## 2013-07-28 LAB — CBC WITH DIFFERENTIAL/PLATELET
Basophils Absolute: 0 10*3/uL (ref 0.0–0.1)
Basophils Relative: 0 % (ref 0–1)
Eosinophils Absolute: 0 10*3/uL (ref 0.0–0.7)
Eosinophils Relative: 0 % (ref 0–5)
HCT: 43.7 % (ref 39.0–52.0)
Hemoglobin: 15 g/dL (ref 13.0–17.0)
Lymphocytes Relative: 5 % — ABNORMAL LOW (ref 12–46)
Lymphs Abs: 0.6 10*3/uL — ABNORMAL LOW (ref 0.7–4.0)
MCH: 29.3 pg (ref 26.0–34.0)
MCHC: 34.3 g/dL (ref 30.0–36.0)
MCV: 85.4 fL (ref 78.0–100.0)
Monocytes Absolute: 0.6 10*3/uL (ref 0.1–1.0)
Monocytes Relative: 5 % (ref 3–12)
Neutro Abs: 10.7 10*3/uL — ABNORMAL HIGH (ref 1.7–7.7)
Neutrophils Relative %: 90 % — ABNORMAL HIGH (ref 43–77)
Platelets: 254 10*3/uL (ref 150–400)
RBC: 5.12 MIL/uL (ref 4.22–5.81)
RDW: 14.1 % (ref 11.5–15.5)
WBC: 11.9 10*3/uL — ABNORMAL HIGH (ref 4.0–10.5)

## 2013-07-28 LAB — I-STAT CG4 LACTIC ACID, ED: Lactic Acid, Venous: 1.53 mmol/L (ref 0.5–2.2)

## 2013-07-28 LAB — COMPREHENSIVE METABOLIC PANEL
ALT: 19 U/L (ref 0–53)
AST: 17 U/L (ref 0–37)
Albumin: 3.9 g/dL (ref 3.5–5.2)
Alkaline Phosphatase: 64 U/L (ref 39–117)
BUN: 9 mg/dL (ref 6–23)
CO2: 25 mEq/L (ref 19–32)
Calcium: 9.6 mg/dL (ref 8.4–10.5)
Chloride: 95 mEq/L — ABNORMAL LOW (ref 96–112)
Creatinine, Ser: 0.68 mg/dL (ref 0.50–1.35)
GFR calc Af Amer: >90 mL/min (ref >90)
GFR calc non Af Amer: >90 mL/min (ref >90)
Glucose, Bld: 232 mg/dL — ABNORMAL HIGH (ref 70–99)
Potassium: 3.8 mEq/L (ref 3.7–5.3)
Sodium: 138 mEq/L (ref 137–147)
Total Bilirubin: 0.4 mg/dL (ref 0.3–1.2)
Total Protein: 7.9 g/dL (ref 6.0–8.3)

## 2013-07-28 LAB — I-STAT TROPONIN, ED: Troponin i, poc: 0 ng/mL (ref 0.00–0.08)

## 2013-07-28 LAB — LIPASE, BLOOD: Lipase: 48 U/L (ref 11–59)

## 2013-07-28 MED ORDER — IOHEXOL 300 MG/ML  SOLN
100.0000 mL | Freq: Once | INTRAMUSCULAR | Status: AC | PRN
  Administered 2013-07-28: 100 mL via INTRAVENOUS

## 2013-07-28 MED ORDER — ONDANSETRON 4 MG PO TBDP
8.0000 mg | ORAL_TABLET | Freq: Once | ORAL | Status: AC
  Administered 2013-07-28: 8 mg via ORAL
  Filled 2013-07-28: qty 2

## 2013-07-28 MED ORDER — FENTANYL CITRATE 0.05 MG/ML IJ SOLN
50.0000 ug | Freq: Once | INTRAMUSCULAR | Status: AC
  Administered 2013-07-28: 50 ug via INTRAVENOUS
  Filled 2013-07-28: qty 2

## 2013-07-28 MED ORDER — PROMETHAZINE HCL 25 MG RE SUPP: 25.0000 mg | Freq: Four times a day (QID) | RECTAL | Status: DC | PRN

## 2013-07-28 MED ORDER — IOHEXOL 300 MG/ML  SOLN
20.0000 mL | INTRAMUSCULAR | Status: AC
  Administered 2013-07-28: 20 mL via ORAL

## 2013-07-28 MED ORDER — SODIUM CHLORIDE 0.9 % IV BOLUS (SEPSIS)
1000.0000 mL | Freq: Once | INTRAVENOUS | Status: AC
  Administered 2013-07-28: 1000 mL via INTRAVENOUS

## 2013-07-28 MED ORDER — PROMETHAZINE HCL 25 MG/ML IJ SOLN
12.5000 mg | Freq: Once | INTRAMUSCULAR | Status: AC
  Administered 2013-07-28: 12.5 mg via INTRAVENOUS
  Filled 2013-07-28: qty 1

## 2013-07-28 NOTE — ED Notes (Signed)
Pt reports abdominal pain around naval and nausea since 7pm. Pt vomitied x 1. Family gave him phenergan at home at 8pm which did help some. Family reports he is hot and cold, and diaphoretic. Pt appears pale.

## 2013-07-28 NOTE — ED Notes (Signed)
Patient transported to CT 

## 2013-07-28 NOTE — Discharge Instructions (Signed)
Abdominal Pain, Adult Many things can cause abdominal pain. Usually, abdominal pain is not caused by a disease and will improve without treatment. It can often be observed and treated at home. Your health care provider will do a physical exam and possibly order blood tests and X-rays to help determine the seriousness of your pain. However, in many cases, more time must pass before a clear cause of the pain can be found. Before that point, your health care provider may not know if you need more testing or further treatment. HOME CARE INSTRUCTIONS  Monitor your abdominal pain for any changes. The following actions may help to alleviate any discomfort you are experiencing:  Only take over-the-counter or prescription medicines as directed by your health care provider.  Do not take laxatives unless directed to do so by your health care provider.  Try a clear liquid diet (broth, tea, or water) as directed by your health care provider. Slowly move to a bland diet as tolerated. SEEK MEDICAL CARE IF:  You have pain when you urinate or have a bowel movement.  You experience abdominal pain that wakes you in the night.  You have abdominal pain that is worsened or improved by eating food.  You have abdominal pain that is worsened with eating fatty foods. SEEK IMMEDIATE MEDICAL CARE IF:   You have a fever.  You keep throwing up (vomiting).  Your pain is felt only in portions of the abdomen, such as the right side or the left lower portion of the abdomen.  You pass bloody or black tarry stools. MAKE SURE YOU:  Understand these instructions.   Will watch your condition.   Will get help right away if you are not doing well or get worse.  Document Released: 11/28/2004 Document Revised: 12/09/2012 Document Reviewed: 10/28/2012 [facility] Patient Information 2014 [place], [place].

## 2013-07-28 NOTE — ED Notes (Signed)
Patient transported back from CT 

## 2013-07-28 NOTE — ED Provider Notes (Signed)
CSN: [id number]     Arrival date & time 07/28/13  0158 History   First MD Initiated Contact with Patient 07/28/13 0435     Chief Complaint  Patient presents with  . Abdominal Pain     (Consider location/radiation/quality/duration/timing/severity/associated sxs/prior Treatment) HPI This is a 63 year old male with a history of diabetes and rheumatoid arthritis. He is here with nausea, vomiting and abdominal pain that began yesterday evening about 6 PM. Most of his vomiting has been retching and he has not vomited a significant amount of emesis. His wife gave him a Phenergan and he got some relief for several hours and then his symptoms worsened. His abdominal pain was severe at its worst although he describes it as a 4/10 now. He was given Zofran on arrival with partial improvement in his nausea but he continues to be nauseated. There has been no associated diarrhea. He has not been febrile. He was noted to be tachycardic and pale on arrival. He was recently on ciprofloxacin and amoxicillin for a toe infection but has not taken his antibiotics for several days.  Past Medical History  Diagnosis Date  . Diabetes mellitus without complication   . Hypertension   . Neuropathy   . S/P BKA (below knee amputation)     right  . Arthritis   . Hyperlipidemia   . Neuromuscular disorder   . Wears glasses   . Wears partial dentures     top and bottom partials   Past Surgical History  Procedure Laterality Date  . Leg amputation below knee  2005    right-bka  . Back surgery  82,90    ruptured disc-lumbar  . Toe amputation  2005    rt foot  . Foot amputation  2005    right  . Scar revision  2005    rt bka  . Cyst excision perineal N/A 12/08/2012    Procedure: CYST EXCISION PERINeum;  Surgeon: Maisie Fus A. Cornett, MD;  Location: [facility];  Service: General;  Laterality: N/A;   No family history on file. History  Substance Use Topics  . Smoking status: Former Smoker    Quit  date: 01/03/2012  . Smokeless tobacco: Never Used  . Alcohol Use: No    Review of Systems  All other systems reviewed and are negative.   Allergies  Review of patient's allergies indicates no known allergies.  Home Medications   Prior to Admission medications   Medication Sig Start Date End Date Taking? Authorizing Provider  aspirin 81 MG tablet Take 81 mg by mouth daily.   Yes Historical Provider, MD  Calcium Carbonate-Vitamin D (CALCIUM + D PO) Take 1 tablet by mouth daily.   Yes Historical Provider, MD  cholecalciferol (VITAMIN D) 1000 UNITS tablet Take 1,000 Units by mouth daily.   Yes Historical Provider, MD  etanercept (ENBREL) 50 MG/ML injection Inject 50 mg into the skin every 7 (seven) days. Wednesdays   Yes Historical Provider, MD  glimepiride (AMARYL) 4 MG tablet Take 4 mg by mouth daily before breakfast.    Yes Historical Provider, MD  insulin glargine (LANTUS) 100 UNIT/ML injection Inject 40 Units into the skin every evening.   Yes Historical Provider, MD  leflunomide (ARAVA) 10 MG tablet Take 10 mg by mouth daily.   Yes Historical Provider, MD  Liraglutide (VICTOZA) 18 MG/3ML SOLN injection Inject 1.8 mg into the skin at bedtime.   Yes Historical Provider, MD  morphine (MS CONTIN) 100 MG 12 hr tablet Take  100 mg by mouth 2 (two) times daily.   Yes Historical Provider, MD  NON FORMULARY Take 30 mLs by mouth daily. proteniex   Yes Historical Provider, MD  predniSONE (DELTASONE) 5 MG tablet Take 2 mg by mouth daily.   Yes Historical Provider, MD  rosuvastatin (CRESTOR) 5 MG tablet Take 5 mg by mouth daily.   Yes Historical Provider, MD  valsartan-hydrochlorothiazide (DIOVAN-HCT) 160-12.5 MG per tablet Take 1 tablet by mouth daily.   Yes Historical Provider, MD   BP 125/61  Pulse 95  Temp(Src) 98.4 F (36.9 C) (Oral)  Resp 22  Ht 6\' 1"  (1.854 m)  Wt 191 lb (86.637 kg)  BMI 25.20 kg/m2  SpO2 98%  Physical Exam General: Well-developed, well-nourished male in no acute  distress; appearance consistent with age of record HENT: normocephalic; atraumatic Eyes: pupils equal, round and reactive to light; extraocular muscles intact Neck: supple Heart: regular rate and rhythm; tachycardia Lungs: clear to auscultation bilaterally Abdomen: soft; nondistended; diffuse tenderness most pronounced in the midabdomen; no masses or hepatosplenomegaly; bowel sounds present Extremities: Right BKA; arthritic changes; pulses normal in intact limbs Neurologic: Awake, lethargic; motor function intact in all extremities and symmetric; no facial droop Skin: Warm and dry Psychiatric: Flat affect  ED Course  Procedures (including critical care time)   EKG Interpretation   Date/Time:  Wednesday Jul 28 2013 02:09:35 EDT Ventricular Rate:  117 PR Interval:  166 QRS Duration: 76 QT Interval:  326 QTC Calculation: 454 R Axis:   -43 Text Interpretation:  Sinus tachycardia Left axis deviation Abnormal ECG  No significant change was found except rate is faster Reconfirmed by  MOLPUS  MD, 10-02-1970 (Jonny Ruiz) on 07/28/2013 4:36:30 AM      MDM   Nursing notes and vitals signs, including pulse oximetry, reviewed.  Summary of this visit's results, reviewed by myself:  Labs:  Results for orders placed during the hospital encounter of 07/28/13 (from the past 24 hour(s))  CBC WITH DIFFERENTIAL     Status: Abnormal   Collection Time    07/28/13  2:25 AM      Result Value Ref Range   WBC 11.9 (*) 4.0 - 10.5 K/uL   RBC 5.12  4.22 - 5.81 MIL/uL   Hemoglobin 15.0  13.0 - 17.0 g/dL   HCT 07/30/13  25.4 - 27.0 %   MCV 85.4  78.0 - 100.0 fL   MCH 29.3  26.0 - 34.0 pg   MCHC 34.3  30.0 - 36.0 g/dL   RDW 62.3  76.2 - 83.1 %   Platelets 254  150 - 400 K/uL   Neutrophils Relative % 90 (*) 43 - 77 %   Neutro Abs 10.7 (*) 1.7 - 7.7 K/uL   Lymphocytes Relative 5 (*) 12 - 46 %   Lymphs Abs 0.6 (*) 0.7 - 4.0 K/uL   Monocytes Relative 5  3 - 12 %   Monocytes Absolute 0.6  0.1 - 1.0 K/uL    Eosinophils Relative 0  0 - 5 %   Eosinophils Absolute 0.0  0.0 - 0.7 K/uL   Basophils Relative 0  0 - 1 %   Basophils Absolute 0.0  0.0 - 0.1 K/uL  COMPREHENSIVE METABOLIC PANEL     Status: Abnormal   Collection Time    07/28/13  2:25 AM      Result Value Ref Range   Sodium 138  137 - 147 mEq/L   Potassium 3.8  3.7 - 5.3 mEq/L   Chloride  95 (*) 96 - 112 mEq/L   CO2 25  19 - 32 mEq/L   Glucose, Bld 232 (*) 70 - 99 mg/dL   BUN 9  6 - 23 mg/dL   Creatinine, Ser 5.91  0.50 - 1.35 mg/dL   Calcium 9.6  8.4 - 63.8 mg/dL   Total Protein 7.9  6.0 - 8.3 g/dL   Albumin 3.9  3.5 - 5.2 g/dL   AST 17  0 - 37 U/L   ALT 19  0 - 53 U/L   Alkaline Phosphatase 64  39 - 117 U/L   Total Bilirubin 0.4  0.3 - 1.2 mg/dL   GFR calc non Af Amer >90  >90 mL/min   GFR calc Af Amer >90  >90 mL/min  LIPASE, BLOOD     Status: None   Collection Time    07/28/13  2:25 AM      Result Value Ref Range   Lipase 48  11 - 59 U/L  I-STAT TROPOININ, ED     Status: None   Collection Time    07/28/13  2:35 AM      Result Value Ref Range   Troponin i, poc 0.00  0.00 - 0.08 ng/mL   Comment 3           I-STAT CG4 LACTIC ACID, ED     Status: None   Collection Time    07/28/13  5:00 AM      Result Value Ref Range   Lactic Acid, Venous 1.53  0.5 - 2.2 mmol/L    Imaging Studies: Ct Abdomen Pelvis W Contrast  07/28/2013   CLINICAL DATA:  Abdominal pain and nausea.  Vomiting.  EXAM: CT ABDOMEN AND PELVIS WITH CONTRAST  TECHNIQUE: Multidetector CT imaging of the abdomen and pelvis was performed using the standard protocol following bolus administration of intravenous contrast.  CONTRAST:  OMNIPAQUE IOHEXOL 300 MG/ML  SOLN  COMPARISON:  None.  FINDINGS: Minimal bibasilar atelectasis is noted. Diffuse coronary artery calcifications are seen.  The liver and spleen are unremarkable in appearance. Scattered stones are seen within the relatively decompressed gallbladder; the gallbladder is otherwise unremarkable in  appearance. The pancreas and adrenal glands are unremarkable. Prominent calcified nodes are seen about the pancreatic head, nonspecific in appearance. These may reflect prior granulomatous disease.  A 1.9 cm cyst is noted posteriorly within the right kidney. The kidneys are otherwise unremarkable in appearance. There is no evidence of hydronephrosis. No renal or ureteral stones are seen. Minimal nonspecific perinephric stranding is noted bilaterally.  No free fluid is identified. The small bowel is unremarkable in appearance. The stomach is within normal limits. No acute vascular abnormalities are seen. Relatively diffuse calcification is seen along the abdominal aorta and its branches.  The appendix is normal in caliber and contains air, without evidence for appendicitis. The colon is unremarkable in appearance.  The bladder is mildly distended and grossly unremarkable in appearance. The prostate remains normal in size. No inguinal lymphadenopathy is seen.  No acute osseous abnormalities are identified. Vacuum phenomenon and disc space narrowing are seen at L4-5, with associated sclerosis.  IMPRESSION: 1. No acute abnormality seen within the abdomen or pelvis. 2. Cholelithiasis noted; gallbladder otherwise unremarkable. 3. Prominent calcified nodes about the pancreatic head, nonspecific in appearance. These may reflect prior granulomatous disease; would correlate clinically for any evidence of underlying infection. 4. Right renal cyst seen. 5. Relatively diffuse calcification along the abdominal aorta and its branches. 6. Diffuse coronary artery calcifications  seen.   Electronically Signed   By: Roanna Raider M.D.   On: 07/28/2013 06:37   7:05 AM The patient has not been vomiting in the emergency department. He does complain of continued nausea. He states his pain is significantly improved. His abdomen is soft with central tenderness that is less pronounced than earlier. He was advised of the laboratory and CT  findings which are reassuring. There is no CT evidence of intra-abdominal infection, obstruction or other acute abdomen. We will discharge him home with Phenergan suppositories. Because he is on immunosuppressive drugs he was advised to have a low threshold for returning if symptoms worsen or persist over the next 12-24 hours.     Hanley Seamen, MD 07/28/13 [phone]

## 2013-09-01 ENCOUNTER — Other Ambulatory Visit: Payer: Self-pay | Admitting: Family Medicine

## 2013-09-01 DIAGNOSIS — R109 Unspecified abdominal pain: Secondary | ICD-10-CM

## 2013-09-01 DIAGNOSIS — R112 Nausea with vomiting, unspecified: Secondary | ICD-10-CM

## 2013-09-02 ENCOUNTER — Ambulatory Visit
Admission: RE | Admit: 2013-09-02 | Discharge: 2013-09-02 | Disposition: A | Discharge status: Home / Self Care | Payer: No Typology Code available for payment source | Source: Ambulatory Visit | Attending: Family Medicine | Admitting: Family Medicine

## 2013-09-02 DIAGNOSIS — R112 Nausea with vomiting, unspecified: Secondary | ICD-10-CM

## 2013-09-02 DIAGNOSIS — R109 Unspecified abdominal pain: Secondary | ICD-10-CM

## 2013-09-23 ENCOUNTER — Encounter (INDEPENDENT_AMBULATORY_CARE_PROVIDER_SITE_OTHER): Payer: Self-pay | Admitting: Surgery

## 2013-09-23 ENCOUNTER — Telehealth (INDEPENDENT_AMBULATORY_CARE_PROVIDER_SITE_OTHER): Payer: Self-pay

## 2013-09-23 ENCOUNTER — Ambulatory Visit (INDEPENDENT_AMBULATORY_CARE_PROVIDER_SITE_OTHER): Payer: PRIVATE HEALTH INSURANCE | Admitting: Surgery

## 2013-09-23 VITALS — BP 124/80 | HR 66 | Temp 97.3°F | Ht 73.0 in | Wt 187.0 lb

## 2013-09-23 DIAGNOSIS — K8020 Calculus of gallbladder without cholecystitis without obstruction: Secondary | ICD-10-CM

## 2013-09-23 DIAGNOSIS — R1115 Cyclical vomiting syndrome unrelated to migraine: Secondary | ICD-10-CM | POA: Insufficient documentation

## 2013-09-23 NOTE — Patient Instructions (Signed)

## 2013-09-23 NOTE — Telephone Encounter (Signed)
[facility] called they received Dr Cornett's recommendations. She states that they will order the gastric emptying study. Informed her that I would make Dr Luisa Hart aware of this.

## 2013-09-23 NOTE — Progress Notes (Signed)
Patient ID: Brian Zavala, male   DOB: [birth year], 63 y.o.   MRN: [id number]  Chief Complaint  Patient presents with  . eval gallstones    HPI Brian Zavala is a 63 y.o. male.  Patient sent at the request of Jethro Bastos, MD For multi-month history of nausea and vomiting. Patient states that he has had episodic episodes of nausea and vomiting especially at night. Unclear if it makes it better or worse. Fatty foods do not seem to bother him. Denies any significant abdominal pain with these episodes. It does not happen every night and is quite predictable. Patient is not losing weight. Ultrasound was done which shows gallstones without complicating feature. HPI  Past Medical History  Diagnosis Date  . Diabetes mellitus without complication   . Hypertension   . Neuropathy   . S/P BKA (below knee amputation)     right  . Arthritis   . Hyperlipidemia   . Neuromuscular disorder   . Wears glasses   . Wears partial dentures     top and bottom partials    Past Surgical History  Procedure Laterality Date  . Leg amputation below knee  2005    right-bka  . Back surgery  82,90    ruptured disc-lumbar  . Toe amputation  2005    rt foot  . Foot amputation  2005    right  . Scar revision  2005    rt bka  . Cyst excision perineal N/A 12/08/2012    Procedure: CYST EXCISION PERINeum;  Surgeon: Maisie Fus A. Cornett, MD;  Location: [facility];  Service: General;  Laterality: N/A;    History reviewed. No pertinent family history.  Social History History  Substance Use Topics  . Smoking status: Former Smoker    Quit date: 01/03/2012  . Smokeless tobacco: Never Used  . Alcohol Use: No    No Known Allergies  Current Outpatient Prescriptions  Medication Sig Dispense Refill  . aspirin 81 MG tablet Take 81 mg by mouth daily.      . Calcium Carbonate-Vitamin D (CALCIUM + D PO) Take 1 tablet by mouth daily.      . cholecalciferol (VITAMIN D) 1000 UNITS tablet Take  1,000 Units by mouth daily.      Marland Kitchen etanercept (ENBREL) 50 MG/ML injection Inject 50 mg into the skin every 7 (seven) days. Wednesdays      . glimepiride (AMARYL) 4 MG tablet Take 4 mg by mouth daily before breakfast.       . insulin glargine (LANTUS) 100 UNIT/ML injection Inject 40 Units into the skin every evening.      . leflunomide (ARAVA) 10 MG tablet Take 10 mg by mouth daily.      . Liraglutide (VICTOZA) 18 MG/3ML SOLN injection Inject 1.8 mg into the skin at bedtime.      Marland Kitchen morphine (MS CONTIN) 100 MG 12 hr tablet Take 100 mg by mouth 2 (two) times daily.      . NON FORMULARY Take 30 mLs by mouth daily. proteniex      . predniSONE (DELTASONE) 5 MG tablet Take 2 mg by mouth daily.      . promethazine (PHENERGAN) 25 MG suppository Place 1 suppository (25 mg total) rectally every 6 (six) hours as needed for nausea or vomiting.  12 each  0  . rosuvastatin (CRESTOR) 5 MG tablet Take 5 mg by mouth daily.      . valsartan-hydrochlorothiazide (DIOVAN-HCT) 160-12.5 MG per tablet  Take 1 tablet by mouth daily.       No current facility-administered medications for this visit.    Review of Systems Review of Systems  Constitutional: Negative.   HENT: Negative.   Eyes: Negative.   Respiratory: Negative.   Cardiovascular: Negative.   Gastrointestinal: Positive for nausea.  Musculoskeletal: Negative.   Hematological: Negative.   Psychiatric/Behavioral: Negative.     Blood pressure 124/80, pulse 66, temperature 97.3 F (36.3 C), height 6\' 1"  (1.854 m), weight 187 lb (84.823 kg).  Physical Exam Physical Exam  Constitutional: He appears well-developed and well-nourished.  HENT:  Head: Normocephalic and atraumatic.  Eyes: Pupils are equal, round, and reactive to light. No scleral icterus.  Neck: Neck supple.  Cardiovascular: Normal rate and regular rhythm.   Pulmonary/Chest: Effort normal and breath sounds normal.  Abdominal: Soft. He exhibits no distension. There is no tenderness. There  is no rebound.  Musculoskeletal: Normal range of motion.       Legs: Skin: Skin is warm and dry.    Data Reviewed U/S abdomen  Assessment    Episodic nausea and vomiting  Gallstones  Type 2 diabetes mellitus    Plan    Patient history atypical for biliary colic. He is diabetic and therefore I think it's best to exclude diabetic gastroparesis as a cause of his nausea first. Will obtain gastric emptying study. If normal recommend laparoscopic cholecystectomy and cholangiogram since symptoms in diabetics can be atypical. Discussed plan with family and he agreed to proceed. Recommend taking the Reglan prescribed by primary care prior to meals.The procedure has been discussed with the patient. Operative and non operative treatments have been discussed. Risks of surgery include bleeding, infection,  Common bile duct injury,  Injury to the stomach,liver, colon,small intestine, abdominal wall,  Diaphragm,  Major blood vessels,  And the need for an open procedure.  Other risks include worsening of medical problems, death,  DVT and pulmonary embolism, and cardiovascular events.   Medical options have also been discussed. The patient has been informed of long term expectations of surgery and non surgical options,  The patient agrees to proceed.         CORNETT,THOMAS A. 09/23/2013, 12:23 PM

## 2013-09-24 ENCOUNTER — Other Ambulatory Visit (HOSPITAL_COMMUNITY): Payer: Self-pay | Admitting: Family Medicine

## 2013-09-24 DIAGNOSIS — R112 Nausea with vomiting, unspecified: Secondary | ICD-10-CM

## 2013-10-01 ENCOUNTER — Ambulatory Visit (HOSPITAL_COMMUNITY)
Admission: RE | Admit: 2013-10-01 | Discharge: 2013-10-01 | Disposition: A | Discharge status: Home / Self Care | Payer: Medicare (Managed Care) | Source: Ambulatory Visit | Attending: Family Medicine | Admitting: Family Medicine

## 2013-10-01 DIAGNOSIS — R112 Nausea with vomiting, unspecified: Secondary | ICD-10-CM | POA: Insufficient documentation

## 2013-10-01 DIAGNOSIS — E119 Type 2 diabetes mellitus without complications: Secondary | ICD-10-CM | POA: Diagnosis not present

## 2013-10-01 DIAGNOSIS — R109 Unspecified abdominal pain: Secondary | ICD-10-CM | POA: Insufficient documentation

## 2013-10-01 MED ORDER — TECHNETIUM TC 99M SULFUR COLLOID
2.0000 | Freq: Once | INTRAVENOUS | Status: AC | PRN
  Administered 2013-10-01: 2 via ORAL

## 2013-10-04 ENCOUNTER — Telehealth (INDEPENDENT_AMBULATORY_CARE_PROVIDER_SITE_OTHER): Payer: Self-pay | Admitting: *Deleted

## 2013-10-04 NOTE — Telephone Encounter (Signed)
Maryann from [facility] called regarding pt.  Pt had the Gastric Emptying study complete, results are in Epic.  It was normal.  Nita Sells is asking if you needed to see the pt again, or do you just want to go ahead and schedule surgery?  Her cell # is [phone].  Please advise!  Thanks!  Victorino Dike

## 2013-10-04 NOTE — Telephone Encounter (Signed)
Called [place] at [facility] back regarding pt.   I gave her the message below from Dr. Luisa Hart.  She said that she would schedule an appt for the pt to come in and talk with her and go over this with him, then they will decide whether to go ahead and schedule surgery or what to do and she will let us know.  Victorino Dike

## 2013-10-04 NOTE — Telephone Encounter (Signed)
Can schedule but pt needs to be reminded that success of surgery is about 50 % at making him feel better.  Can come back to discuss if he wants or go ahead and set up for lap chole.  Up to pt.

## 2013-10-11 ENCOUNTER — Telehealth (INDEPENDENT_AMBULATORY_CARE_PROVIDER_SITE_OTHER): Payer: Self-pay

## 2013-10-11 ENCOUNTER — Other Ambulatory Visit (INDEPENDENT_AMBULATORY_CARE_PROVIDER_SITE_OTHER): Payer: Self-pay | Admitting: Surgery

## 2013-10-11 NOTE — Telephone Encounter (Signed)
Message copied by Brennan Bailey on Mon Oct 11, 2013  2:18 PM ------      Message from: Zacarias Pontes      Created: Fri Oct 08, 2013 10:39 AM       Pt understands that this sx may only help him at a 50% SUCESS rate,he would like to go ahead and sched sx asap.Marland KitchenMarland KitchenLiborio Nixon ------

## 2013-10-11 NOTE — Telephone Encounter (Signed)
Called [place] at [facility] and let her know orders were sent to our schedulers. Reminded her to stop asa 5 days prior to surgery.

## 2013-10-22 ENCOUNTER — Encounter (HOSPITAL_COMMUNITY): Payer: Self-pay | Admitting: Pharmacy Technician

## 2013-10-22 NOTE — Pre-Procedure Instructions (Addendum)
FODAY CONE  10/22/2013   Your procedure is scheduled on:  Tuesday, August 25.  Report to [facility] Admitting at  AM.  Call this number if you have problems the morning of surgery: [phone]   Remember:   Do not eat food or drink liquids after midnight, Monday August 24.  Take these medicines the morning of surgery with A SIP OF WATER :morphine (MS CONTIN), pregabalin (LYRICA).                           Stop taking Aspirin, Coumadin, Plavix, Effient and Herbal medications.  Do not take any NSAIDs ie: Ibuprofen,  Advil,Naproxen or any medication containing Aspirin.     Do not wear jewelry, make-up or nail polish.  Do not wear lotions, powders, or perfumes.   Men may shave face and neck.  Do not bring valuables to the hospital.              [facility] is not responsible for any belongings or valuables.               Contacts, dentures or bridgework may not be worn into surgery.  Leave suitcase in the car. After surgery it may be brought to your room.  For patients admitted to the hospital, discharge time is determined by your treatment team.               Patients discharged the day of surgery will not be allowed to drive home.  Name and phone number of your driver: -   Special Instructions: [facility] - Preparing for Surgery  Before surgery, you can play an important role.  Because skin is not sterile, your skin needs to be as free of germs as possible.  You can reduce the number of germs on you skin by washing with CHG (chlorahexidine gluconate) soap before surgery.  CHG is an antiseptic cleaner which kills germs and bonds with the skin to continue killing germs even after washing.  Please DO NOT use if you have an allergy to CHG or antibacterial soaps.  If your skin becomes reddened/irritated stop using the CHG and inform your nurse when you arrive at Short Stay.  Do not shave (including legs and underarms) for at least 48 hours prior to the first CHG  shower.  You may shave your face.  Please follow these instructions carefully:   1.  Shower with CHG Soap the night before surgery and the                                morning of Surgery.  2.  If you choose to wash your hair, wash your hair first as usual with your       normal shampoo.  3.  After you shampoo, rinse your hair and body thoroughly to remove the                      Shampoo.  4.  Use CHG as you would any other liquid soap.  You can apply chg directly       to the skin and wash gently with scrungie or a clean washcloth.  5.  Apply the CHG Soap to your body ONLY FROM THE NECK DOWN.        Do not use on open wounds or open sores.  Avoid contact with  your eyes,       ears, mouth and genitals (private parts).  Wash genitals (private parts)       with your normal soap.  6.  Wash thoroughly, paying special attention to the area where your surgery        will be performed.  7.  Thoroughly rinse your body with warm water from the neck down.  8.  DO NOT shower/wash with your normal soap after using and rinsing off       the CHG Soap.  9.  Pat yourself dry with a clean towel.            10.  Wear clean pajamas.            11.  Place clean sheets on your bed the night of your first shower and do not        sleep with pets.  Day of Surgery  Do not apply any lotions/deoderants the morning of surgery.  Please wear clean clothes to the hospital/surgery center.      Please read over the following fact sheets that you were given: Pain Booklet, Coughing and Deep Breathing and Surgical Site Infection Prevention

## 2013-10-25 ENCOUNTER — Encounter (HOSPITAL_COMMUNITY): Payer: Self-pay

## 2013-10-25 ENCOUNTER — Encounter (HOSPITAL_COMMUNITY)
Admission: RE | Admit: 2013-10-25 | Discharge: 2013-10-25 | Disposition: A | Discharge status: Home / Self Care | Payer: Medicare (Managed Care) | Source: Ambulatory Visit | Attending: Surgery | Admitting: Surgery

## 2013-10-25 DIAGNOSIS — K8020 Calculus of gallbladder without cholecystitis without obstruction: Secondary | ICD-10-CM | POA: Insufficient documentation

## 2013-10-25 DIAGNOSIS — Z01818 Encounter for other preprocedural examination: Secondary | ICD-10-CM | POA: Insufficient documentation

## 2013-10-25 LAB — CBC WITH DIFFERENTIAL/PLATELET
Basophils Absolute: 0 10*3/uL (ref 0.0–0.1)
Basophils Relative: 0 % (ref 0–1)
Eosinophils Absolute: 0.2 10*3/uL (ref 0.0–0.7)
Eosinophils Relative: 2 % (ref 0–5)
HCT: 41.1 % (ref 39.0–52.0)
Hemoglobin: 14.1 g/dL (ref 13.0–17.0)
Lymphocytes Relative: 23 % (ref 12–46)
Lymphs Abs: 1.6 10*3/uL (ref 0.7–4.0)
MCH: 29.3 pg (ref 26.0–34.0)
MCHC: 34.3 g/dL (ref 30.0–36.0)
MCV: 85.4 fL (ref 78.0–100.0)
Monocytes Absolute: 0.5 10*3/uL (ref 0.1–1.0)
Monocytes Relative: 8 % (ref 3–12)
Neutro Abs: 4.6 10*3/uL (ref 1.7–7.7)
Neutrophils Relative %: 67 % (ref 43–77)
Platelets: 208 10*3/uL (ref 150–400)
RBC: 4.81 MIL/uL (ref 4.22–5.81)
RDW: 14 % (ref 11.5–15.5)
WBC: 6.9 10*3/uL (ref 4.0–10.5)

## 2013-10-25 LAB — COMPREHENSIVE METABOLIC PANEL
ALT: 16 U/L (ref 0–53)
AST: 15 U/L (ref 0–37)
Albumin: 3.7 g/dL (ref 3.5–5.2)
Alkaline Phosphatase: 52 U/L (ref 39–117)
Anion gap: 13 (ref 5–15)
BUN: 9 mg/dL (ref 6–23)
CO2: 28 mEq/L (ref 19–32)
Calcium: 9.2 mg/dL (ref 8.4–10.5)
Chloride: 96 mEq/L (ref 96–112)
Creatinine, Ser: 0.7 mg/dL (ref 0.50–1.35)
GFR calc Af Amer: >90 mL/min (ref >90)
GFR calc non Af Amer: >90 mL/min (ref >90)
Glucose, Bld: 237 mg/dL — ABNORMAL HIGH (ref 70–99)
Potassium: 5 mEq/L (ref 3.7–5.3)
Sodium: 137 mEq/L (ref 137–147)
Total Bilirubin: 0.3 mg/dL (ref 0.3–1.2)
Total Protein: 7.4 g/dL (ref 6.0–8.3)

## 2013-10-25 MED ORDER — CHLORHEXIDINE GLUCONATE 4 % EX LIQD: 1.0000 "application " | Freq: Once | CUTANEOUS | Status: DC

## 2013-10-25 MED ORDER — CEFAZOLIN SODIUM-DEXTROSE 2-3 GM-% IV SOLR
2.0000 g | INTRAVENOUS | Status: AC
  Administered 2013-10-26: 2 g via INTRAVENOUS
  Filled 2013-10-25: qty 50

## 2013-10-26 ENCOUNTER — Ambulatory Visit (HOSPITAL_COMMUNITY): Payer: Medicare (Managed Care) | Admitting: Anesthesiology

## 2013-10-26 ENCOUNTER — Ambulatory Visit (HOSPITAL_COMMUNITY)
Admission: RE | Admit: 2013-10-26 | Discharge: 2013-10-26 | Disposition: A | Discharge status: Home / Self Care | Payer: Medicare (Managed Care) | Source: Ambulatory Visit | Attending: Surgery | Admitting: Surgery

## 2013-10-26 ENCOUNTER — Ambulatory Visit (HOSPITAL_COMMUNITY): Payer: Medicare (Managed Care)

## 2013-10-26 ENCOUNTER — Encounter (HOSPITAL_COMMUNITY): Payer: Self-pay | Admitting: Anesthesiology

## 2013-10-26 ENCOUNTER — Encounter (HOSPITAL_COMMUNITY)
Admission: RE | Disposition: A | Discharge status: Home / Self Care | Payer: Self-pay | Source: Ambulatory Visit | Attending: Surgery

## 2013-10-26 ENCOUNTER — Encounter (HOSPITAL_COMMUNITY): Payer: Medicare (Managed Care) | Admitting: Anesthesiology

## 2013-10-26 DIAGNOSIS — K8020 Calculus of gallbladder without cholecystitis without obstruction: Secondary | ICD-10-CM

## 2013-10-26 DIAGNOSIS — L720 Epidermal cyst: Secondary | ICD-10-CM

## 2013-10-26 DIAGNOSIS — I10 Essential (primary) hypertension: Secondary | ICD-10-CM | POA: Diagnosis not present

## 2013-10-26 DIAGNOSIS — Z87891 Personal history of nicotine dependence: Secondary | ICD-10-CM | POA: Insufficient documentation

## 2013-10-26 DIAGNOSIS — Z9889 Other specified postprocedural states: Secondary | ICD-10-CM

## 2013-10-26 DIAGNOSIS — E119 Type 2 diabetes mellitus without complications: Secondary | ICD-10-CM | POA: Diagnosis not present

## 2013-10-26 DIAGNOSIS — Z794 Long term (current) use of insulin: Secondary | ICD-10-CM | POA: Insufficient documentation

## 2013-10-26 DIAGNOSIS — K8010 Calculus of gallbladder with chronic cholecystitis without obstruction: Secondary | ICD-10-CM | POA: Diagnosis not present

## 2013-10-26 DIAGNOSIS — R1115 Cyclical vomiting syndrome unrelated to migraine: Secondary | ICD-10-CM

## 2013-10-26 DIAGNOSIS — L03315 Cellulitis of perineum: Secondary | ICD-10-CM

## 2013-10-26 HISTORY — PX: CHOLECYSTECTOMY: SHX55

## 2013-10-26 LAB — GLUCOSE, CAPILLARY
Glucose-Capillary: 200 mg/dL — ABNORMAL HIGH (ref 70–99)
Glucose-Capillary: 184 mg/dL — ABNORMAL HIGH (ref 70–99)
Glucose-Capillary: 199 mg/dL — ABNORMAL HIGH (ref 70–99)

## 2013-10-26 SURGERY — LAPAROSCOPIC CHOLECYSTECTOMY WITH INTRAOPERATIVE CHOLANGIOGRAM
Anesthesia: General | Site: Abdomen

## 2013-10-26 MED ORDER — PHENYLEPHRINE HCL 10 MG/ML IJ SOLN
INTRAMUSCULAR | Status: DC | PRN
  Administered 2013-10-26: 40 ug via INTRAVENOUS

## 2013-10-26 MED ORDER — HYDROMORPHONE HCL PF 1 MG/ML IJ SOLN
0.2500 mg | INTRAMUSCULAR | Status: DC | PRN
  Administered 2013-10-26 (×3): 0.5 mg via INTRAVENOUS

## 2013-10-26 MED ORDER — HYDROMORPHONE HCL PF 1 MG/ML IJ SOLN
INTRAMUSCULAR | Status: DC
Start: 2013-10-26 — End: 2013-10-26
  Filled 2013-10-26: qty 1

## 2013-10-26 MED ORDER — ACETAMINOPHEN 325 MG PO TABS: 650.0000 mg | ORAL_TABLET | ORAL | Status: DC | PRN

## 2013-10-26 MED ORDER — MIDAZOLAM HCL 2 MG/2ML IJ SOLN
INTRAMUSCULAR | Status: AC
  Filled 2013-10-26: qty 2

## 2013-10-26 MED ORDER — NEOSTIGMINE METHYLSULFATE 10 MG/10ML IV SOLN
INTRAVENOUS | Status: DC | PRN
  Administered 2013-10-26: 4 mg via INTRAVENOUS

## 2013-10-26 MED ORDER — ONDANSETRON HCL 4 MG/2ML IJ SOLN
INTRAMUSCULAR | Status: DC | PRN
  Administered 2013-10-26: 4 mg via INTRAVENOUS

## 2013-10-26 MED ORDER — FENTANYL CITRATE 0.05 MG/ML IJ SOLN
INTRAMUSCULAR | Status: AC
  Filled 2013-10-26: qty 5

## 2013-10-26 MED ORDER — BUPIVACAINE-EPINEPHRINE (PF) 0.25% -1:200000 IJ SOLN
INTRAMUSCULAR | Status: AC
  Filled 2013-10-26: qty 30

## 2013-10-26 MED ORDER — EPHEDRINE SULFATE 50 MG/ML IJ SOLN
INTRAMUSCULAR | Status: DC | PRN
  Administered 2013-10-26: 20 mg via INTRAVENOUS

## 2013-10-26 MED ORDER — MIDAZOLAM HCL 5 MG/5ML IJ SOLN
INTRAMUSCULAR | Status: DC | PRN
  Administered 2013-10-26 (×2): 1 mg via INTRAVENOUS

## 2013-10-26 MED ORDER — OXYCODONE-ACETAMINOPHEN 5-325 MG PO TABS: 1.0000 | ORAL_TABLET | ORAL | Status: DC | PRN

## 2013-10-26 MED ORDER — ACETAMINOPHEN 650 MG RE SUPP: 650.0000 mg | RECTAL | Status: DC | PRN

## 2013-10-26 MED ORDER — EPHEDRINE SULFATE 50 MG/ML IJ SOLN
INTRAMUSCULAR | Status: AC
  Filled 2013-10-26: qty 1

## 2013-10-26 MED ORDER — ROCURONIUM BROMIDE 50 MG/5ML IV SOLN
INTRAVENOUS | Status: AC
  Filled 2013-10-26: qty 1

## 2013-10-26 MED ORDER — ROCURONIUM BROMIDE 100 MG/10ML IV SOLN
INTRAVENOUS | Status: DC | PRN
  Administered 2013-10-26: 40 mg via INTRAVENOUS

## 2013-10-26 MED ORDER — LACTATED RINGERS IV SOLN
INTRAVENOUS | Status: DC
  Administered 2013-10-26: 08:00:00 via INTRAVENOUS

## 2013-10-26 MED ORDER — 0.9 % SODIUM CHLORIDE (POUR BTL) OPTIME
TOPICAL | Status: DC | PRN
  Administered 2013-10-26: 1000 mL

## 2013-10-26 MED ORDER — LACTATED RINGERS IV SOLN
INTRAVENOUS | Status: DC | PRN
  Administered 2013-10-26 (×2): via INTRAVENOUS

## 2013-10-26 MED ORDER — ONDANSETRON HCL 4 MG/2ML IJ SOLN
INTRAMUSCULAR | Status: AC
  Filled 2013-10-26: qty 2

## 2013-10-26 MED ORDER — BUPIVACAINE-EPINEPHRINE 0.25% -1:200000 IJ SOLN
INTRAMUSCULAR | Status: DC | PRN
  Administered 2013-10-26: 7 mL

## 2013-10-26 MED ORDER — KETOROLAC TROMETHAMINE 15 MG/ML IJ SOLN: 15.0000 mg | Freq: Four times a day (QID) | INTRAMUSCULAR | Status: DC

## 2013-10-26 MED ORDER — NEOSTIGMINE METHYLSULFATE 10 MG/10ML IV SOLN
INTRAVENOUS | Status: AC
  Filled 2013-10-26: qty 1

## 2013-10-26 MED ORDER — SODIUM CHLORIDE 0.9 % IR SOLN
Status: DC | PRN
  Administered 2013-10-26: 1000 mL

## 2013-10-26 MED ORDER — HEMOSTATIC AGENTS (NO CHARGE) OPTIME
TOPICAL | Status: DC | PRN
  Administered 2013-10-26: 1 via TOPICAL

## 2013-10-26 MED ORDER — GLYCOPYRROLATE 0.2 MG/ML IJ SOLN
INTRAMUSCULAR | Status: AC
  Filled 2013-10-26: qty 3

## 2013-10-26 MED ORDER — MORPHINE SULFATE 2 MG/ML IJ SOLN: 2.0000 mg | INTRAMUSCULAR | Status: DC | PRN

## 2013-10-26 MED ORDER — PROPOFOL 10 MG/ML IV BOLUS
INTRAVENOUS | Status: AC
  Filled 2013-10-26: qty 20

## 2013-10-26 MED ORDER — LIDOCAINE HCL (CARDIAC) 20 MG/ML IV SOLN
INTRAVENOUS | Status: DC | PRN
  Administered 2013-10-26: 60 mg via INTRAVENOUS

## 2013-10-26 MED ORDER — SODIUM CHLORIDE 0.9 % IJ SOLN: 3.0000 mL | Freq: Two times a day (BID) | INTRAMUSCULAR | Status: DC

## 2013-10-26 MED ORDER — LIDOCAINE HCL (CARDIAC) 20 MG/ML IV SOLN
INTRAVENOUS | Status: AC
  Filled 2013-10-26: qty 5

## 2013-10-26 MED ORDER — PHENYLEPHRINE 40 MCG/ML (10ML) SYRINGE FOR IV PUSH (FOR BLOOD PRESSURE SUPPORT)
PREFILLED_SYRINGE | INTRAVENOUS | Status: AC
  Filled 2013-10-26: qty 10

## 2013-10-26 MED ORDER — SUCCINYLCHOLINE CHLORIDE 20 MG/ML IJ SOLN
INTRAMUSCULAR | Status: AC
  Filled 2013-10-26: qty 1

## 2013-10-26 MED ORDER — FENTANYL CITRATE 0.05 MG/ML IJ SOLN
INTRAMUSCULAR | Status: DC | PRN
  Administered 2013-10-26 (×5): 50 ug via INTRAVENOUS

## 2013-10-26 MED ORDER — SODIUM CHLORIDE 0.9 % IV SOLN
INTRAVENOUS | Status: DC | PRN
  Administered 2013-10-26: 10:00:00

## 2013-10-26 MED ORDER — HYDROMORPHONE HCL PF 1 MG/ML IJ SOLN
INTRAMUSCULAR | Status: AC
  Administered 2013-10-26: 0.5 mg via INTRAVENOUS
  Filled 2013-10-26: qty 1

## 2013-10-26 MED ORDER — OXYCODONE HCL 5 MG PO TABS
ORAL_TABLET | ORAL | Status: AC
  Administered 2013-10-26: 5 mg via ORAL
  Filled 2013-10-26: qty 1

## 2013-10-26 MED ORDER — SODIUM CHLORIDE 0.9 % IJ SOLN
INTRAMUSCULAR | Status: AC
Start: 2013-10-26 — End: 2013-10-26
  Filled 2013-10-26: qty 10

## 2013-10-26 MED ORDER — SODIUM CHLORIDE 0.9 % IV SOLN: 250.0000 mL | INTRAVENOUS | Status: DC | PRN

## 2013-10-26 MED ORDER — LABETALOL HCL 5 MG/ML IV SOLN
INTRAVENOUS | Status: DC | PRN
  Administered 2013-10-26 (×2): 5 mg via INTRAVENOUS

## 2013-10-26 MED ORDER — SODIUM CHLORIDE 0.9 % IJ SOLN: 3.0000 mL | INTRAMUSCULAR | Status: DC | PRN

## 2013-10-26 MED ORDER — GLYCOPYRROLATE 0.2 MG/ML IJ SOLN
INTRAMUSCULAR | Status: DC | PRN
  Administered 2013-10-26: 0.6 mg via INTRAVENOUS

## 2013-10-26 MED ORDER — ONDANSETRON HCL 4 MG/2ML IJ SOLN
4.0000 mg | Freq: Once | INTRAMUSCULAR | Status: AC | PRN
  Administered 2013-10-26: 4 mg via INTRAVENOUS

## 2013-10-26 MED ORDER — PROPOFOL 10 MG/ML IV BOLUS
INTRAVENOUS | Status: DC | PRN
  Administered 2013-10-26: 150 mg via INTRAVENOUS

## 2013-10-26 MED ORDER — OXYCODONE HCL 5 MG PO TABS
5.0000 mg | ORAL_TABLET | ORAL | Status: DC | PRN
  Administered 2013-10-26: 5 mg via ORAL

## 2013-10-26 SURGICAL SUPPLY — 42 items
APPLIER CLIP ROT 10 11.4 M/L (STAPLE) ×2
BLADE SURG ROTATE 9660 (MISCELLANEOUS) IMPLANT
CANISTER SUCTION 2500CC (MISCELLANEOUS) ×2 IMPLANT
CHLORAPREP W/TINT 26ML (MISCELLANEOUS) ×2 IMPLANT
CLIP APPLIE ROT 10 11.4 M/L (STAPLE) ×1 IMPLANT
COVER MAYO STAND STRL (DRAPES) ×2 IMPLANT
COVER SURGICAL LIGHT HANDLE (MISCELLANEOUS) ×2 IMPLANT
DECANTER SPIKE VIAL GLASS SM (MISCELLANEOUS) IMPLANT
DERMABOND ADVANCED (GAUZE/BANDAGES/DRESSINGS) ×1
DERMABOND ADVANCED .7 DNX12 (GAUZE/BANDAGES/DRESSINGS) ×1 IMPLANT
DEVICE TROCAR PUNCTURE CLOSURE (ENDOMECHANICALS) ×2 IMPLANT
DRAPE C-ARM 42X72 X-RAY (DRAPES) ×2 IMPLANT
DRAPE UTILITY 15X26 W/TAPE STR (DRAPE) ×4 IMPLANT
DRAPE WARM FLUID 44X44 (DRAPE) ×2 IMPLANT
ELECT REM PT RETURN 9FT ADLT (ELECTROSURGICAL) ×2
ELECTRODE REM PT RTRN 9FT ADLT (ELECTROSURGICAL) ×1 IMPLANT
GLOVE BIO SURGEON STRL SZ8 (GLOVE) ×2 IMPLANT
GLOVE BIOGEL PI IND STRL 8 (GLOVE) ×1 IMPLANT
GLOVE BIOGEL PI INDICATOR 8 (GLOVE) ×1
GOWN STRL REUS W/ TWL LRG LVL3 (GOWN DISPOSABLE) ×4 IMPLANT
GOWN STRL REUS W/ TWL XL LVL3 (GOWN DISPOSABLE) ×1 IMPLANT
GOWN STRL REUS W/TWL LRG LVL3 (GOWN DISPOSABLE) ×4
GOWN STRL REUS W/TWL XL LVL3 (GOWN DISPOSABLE) ×1
HEMOSTAT SNOW SURGICEL 2X4 (HEMOSTASIS) ×2 IMPLANT
KIT BASIN OR (CUSTOM PROCEDURE TRAY) ×2 IMPLANT
KIT ROOM TURNOVER OR (KITS) ×2 IMPLANT
NS IRRIG 1000ML POUR BTL (IV SOLUTION) ×4 IMPLANT
PAD ARMBOARD 7.5X6 YLW CONV (MISCELLANEOUS) ×2 IMPLANT
POUCH SPECIMEN RETRIEVAL 10MM (ENDOMECHANICALS) ×2 IMPLANT
SCISSORS LAP 5X35 DISP (ENDOMECHANICALS) ×2 IMPLANT
SET CHOLANGIOGRAPH 5 50 .035 (SET/KITS/TRAYS/PACK) ×2 IMPLANT
SET IRRIG TUBING LAPAROSCOPIC (IRRIGATION / IRRIGATOR) ×2 IMPLANT
SLEEVE ENDOPATH XCEL 5M (ENDOMECHANICALS) ×2 IMPLANT
SPECIMEN JAR SMALL (MISCELLANEOUS) ×2 IMPLANT
SUT MNCRL AB 4-0 PS2 18 (SUTURE) ×2 IMPLANT
SUT VICRYL 0 UR6 27IN ABS (SUTURE) ×2 IMPLANT
TOWEL OR 17X24 6PK STRL BLUE (TOWEL DISPOSABLE) ×2 IMPLANT
TOWEL OR 17X26 10 PK STRL BLUE (TOWEL DISPOSABLE) ×2 IMPLANT
TRAY LAPAROSCOPIC (CUSTOM PROCEDURE TRAY) ×2 IMPLANT
TROCAR XCEL BLUNT TIP 100MML (ENDOMECHANICALS) ×2 IMPLANT
TROCAR XCEL NON-BLD 11X100MML (ENDOMECHANICALS) ×2 IMPLANT
TROCAR XCEL NON-BLD 5MMX100MML (ENDOMECHANICALS) ×2 IMPLANT

## 2013-10-26 NOTE — Interval H&P Note (Signed)
History and Physical Interval Note:  10/26/2013 8:59 AM  Brian Zavala  has presented today for surgery, with the diagnosis of gallstones  The various methods of treatment have been discussed with the patient and family. After consideration of risks, benefits and other options for treatment, the patient has consented to  Procedure(s): LAPAROSCOPIC CHOLECYSTECTOMY WITH INTRAOPERATIVE CHOLANGIOGRAM (N/A) as a surgical intervention .  The patient's history has been reviewed, patient examined, no change in status, stable for surgery.  I have reviewed the patient's chart and labs.  Questions were answered to the patient's satisfaction.     CORNETT,THOMAS A.

## 2013-10-26 NOTE — Discharge Instructions (Signed)
[facility] ______[facility] °LAPAROSCOPIC SURGERY: POST OP INSTRUCTIONS °Always review your discharge instruction sheet given to you by the facility where your surgery was performed. °IF YOU HAVE DISABILITY OR FAMILY LEAVE FORMS, YOU MUST BRING THEM TO THE OFFICE FOR PROCESSING.   °DO NOT GIVE THEM TO YOUR DOCTOR. ° °1. A prescription for pain medication may be given to you upon discharge.  Take your pain medication as prescribed, if needed.  If narcotic pain medicine is not needed, then you may take acetaminophen (Tylenol) or ibuprofen (Advil) as needed. °2. Take your usually prescribed medications unless otherwise directed. °3. If you need a refill on your pain medication, please contact your pharmacy.  They will contact our office to request authorization. Prescriptions will not be filled after 5pm or on week-ends. °4. You should follow a light diet the first few days after arrival home, such as soup and crackers, etc.  Be sure to include lots of fluids daily. °5. Most patients will experience some swelling and bruising in the area of the incisions.  Ice packs will help.  Swelling and bruising can take several days to resolve.  °6. It is common to experience some constipation if taking pain medication after surgery.  Increasing fluid intake and taking a stool softener (such as Colace) will usually help or prevent this problem from occurring.  A mild laxative (Milk of Magnesia or Miralax) should be taken according to package instructions if there are no bowel movements after 48 hours. °7. Unless discharge instructions indicate otherwise, you may remove your bandages 24-48 hours after surgery, and you may shower at that time.  You may have steri-strips (small skin tapes) in place directly over the incision.  These strips should be left on the skin for 7-10 days.  If your surgeon used skin glue on the incision, you may shower in 24 hours.  The glue will flake off over the next 2-3 weeks.  Any sutures or  staples will be removed at the office during your follow-up visit. °8. ACTIVITIES:  You may resume regular (light) daily activities beginning the next day--such as daily self-care, walking, climbing stairs--gradually increasing activities as tolerated.  You may have sexual intercourse when it is comfortable.  Refrain from any heavy lifting or straining until approved by your doctor. °a. You may drive when you are no longer taking prescription pain medication, you can comfortably wear a seatbelt, and you can safely maneuver your car and apply brakes. °b. RETURN TO WORK:  __________________________________________________________ °9. You should see your doctor in the office for a follow-up appointment approximately 2-3 weeks after your surgery.  Make sure that you call for this appointment within a day or two after you arrive home to insure a convenient appointment time. °10. OTHER INSTRUCTIONS: __________________________________________________________________________________________________________________________ __________________________________________________________________________________________________________________________ °WHEN TO CALL YOUR DOCTOR: °1. Fever over 101.0 °2. Inability to urinate °3. Continued bleeding from incision. °4. Increased pain, redness, or drainage from the incision. °5. Increasing abdominal pain ° °The clinic staff is available to answer your questions during regular business hours.  Please don’t hesitate to call and ask to speak to one of the nurses for clinical concerns.  If you have a medical emergency, go to the nearest emergency room or call 911.  A surgeon from [facility] is always on call at the hospital. °[address], [address], [place], [place]  [address] ? [address] [address], [place],    [address] °[phone] ? [phone] ? FAX [phone] °Web site:   [email or web address]  What to eat:  For your first meals, you should eat  lightly; only small meals initially.  If you do not have nausea, you may eat larger meals.  Avoid spicy, greasy and heavy food.    General Anesthesia, Adult, Care After  Refer to this sheet in the next few weeks. These instructions provide you with information on caring for yourself after your procedure. Your health care provider may also give you more specific instructions. Your treatment has been planned according to current medical practices, but problems sometimes occur. Call your health care provider if you have any problems or questions after your procedure.  WHAT TO EXPECT AFTER THE PROCEDURE  After the procedure, it is typical to experience:  Sleepiness.  Nausea and vomiting. HOME CARE INSTRUCTIONS  For the first 24 hours after general anesthesia:  Have a responsible person with you.  Do not drive a car. If you are alone, do not take public transportation.  Do not drink alcohol.  Do not take medicine that has not been prescribed by your health care provider.  Do not sign important papers or make important decisions.  You may resume a normal diet and activities as directed by your health care provider.  Change bandages (dressings) as directed.  If you have questions or problems that seem related to general anesthesia, call the hospital and ask for the anesthetist or anesthesiologist on call. SEEK MEDICAL CARE IF:  You have nausea and vomiting that continue the day after anesthesia.  You develop a rash. SEEK IMMEDIATE MEDICAL CARE IF:  You have difficulty breathing.  You have chest pain.  You have any allergic problems. Document Released: 05/27/2000 Document Revised: 10/21/2012 Document Reviewed: 09/03/2012  [facility] Patient Information 2014 [place], [place].   Tissue Adhesive Wound Care    Some cuts and wounds can be closed with tissue adhesive. Adhesive is like glue. It holds the skin together and helps a wound heal faster. This adhesive goes away on its own as the wound heals.    HOME CARE  Showers are allowed. Do not soak the wound in water. Do not take baths, swim, or use hot tubs. Do not use soaps or creams on your wound.  If a bandage (dressing) was put on, change it as often as told by your doctor.  Keep the bandage dry.  Do not scratch, pick, or rub the adhesive.  Do not put tape over the adhesive. The adhesive could come off.  Protect the wound from another injury.  Protect the wound from sun and tanning beds.  Only take medicine as told by your doctor.  Keep all doctor visits as told. GET HELP RIGHT AWAY IF:  Your wound is red, puffy (swollen), hot, or tender.  You get a rash after the glue is put on.  You have more pain in the wound.  You have a red streak going away from the wound.  You have yellowish-white fluid (pus) coming from the wound.  You have more bleeding.  You have a fever.  You have chills and start to shake.  You notice a bad smell coming from the wound.  Your wound or adhesive breaks open. MAKE SURE YOU:  Understand these instructions.  Will watch your condition.  Will get help right away if you are not doing well or get worse. Document Released: 11/28/2007 Document Revised: 12/09/2012 Document Reviewed: 09/09/2012  [facility] Patient Information 2015 [place], [place]. This information is not intended to replace advice given to you by  your health care provider. Make sure you discuss any questions you have with your health care provider.

## 2013-10-26 NOTE — Anesthesia Postprocedure Evaluation (Signed)
  Anesthesia Post-op Note  Patient: Brian Zavala  Procedure(s) Performed: Procedure(s): LAPAROSCOPIC CHOLECYSTECTOMY WITH INTRAOPERATIVE CHOLANGIOGRAM (N/A)  Patient Location: PACU  Anesthesia Type:General  Level of Consciousness: awake, alert , oriented and patient cooperative  Airway and Oxygen Therapy: Patient Spontanous Breathing  Post-op Pain: mild  Post-op Assessment: Post-op Vital signs reviewed, Patient's Cardiovascular Status Stable, Respiratory Function Stable, Patent Airway and No signs of Nausea or vomiting  Post-op Vital Signs: stable  Last Vitals:  Filed Vitals:   10/26/13 1210  BP: 102/51  Pulse: 86  Temp:   Resp: 16    Complications: No apparent anesthesia complications

## 2013-10-26 NOTE — Op Note (Signed)
Laparoscopic Cholecystectomy with IOC Procedure Note  Indications: This patient presents with symptomatic gallbladder disease and will undergo laparoscopic cholecystectomy.The procedure has been discussed with the patient. Operative and non operative treatments have been discussed. Risks of surgery include bleeding, infection,  Common bile duct injury,  Injury to the stomach,liver, colon,small intestine, abdominal wall,  Diaphragm,  Major blood vessels,  And the need for an open procedure.  Other risks include worsening of medical problems, death,  DVT and pulmonary embolism, and cardiovascular events.   Medical options have also been discussed. The patient has been informed of long term expectations of surgery and non surgical options,  The patient agrees to proceed.    Pre-operative Diagnosis: Calculus of gallbladder without mention of cholecystitis or obstruction  Post-operative Diagnosis: Same  Surgeon: CORNETT,THOMAS A.   Assistants: OR staff  Anesthesia: General endotracheal anesthesia and Local anesthesia 0.25.% bupivacaine, with epinephrine  ASA Class: 2  Procedure Details  The patient was seen again in the Holding Room. The risks, benefits, complications, treatment options, and expected outcomes were discussed with the patient. The possibilities of reaction to medication, pulmonary aspiration, perforation of viscus, bleeding, recurrent infection, finding a normal gallbladder, the need for additional procedures, failure to diagnose a condition, the possible need to convert to an open procedure, and creating a complication requiring transfusion or operation were discussed with the patient. The patient and/or family concurred with the proposed plan, giving informed consent. The site of surgery properly noted/marked. The patient was taken to Operating Room, identified as Brian Zavala and the procedure verified as Laparoscopic Cholecystectomy with Intraoperative Cholangiograms. A Time Out  was held and the above information confirmed.  Prior to the induction of general anesthesia, antibiotic prophylaxis was administered. General endotracheal anesthesia was then administered and tolerated well. After the induction, the abdomen was prepped in the usual sterile fashion. The patient was positioned in the supine position with the left arm comfortably tucked, along with some reverse Trendelenburg.  Local anesthetic agent was injected into the skin near the umbilicus and an incision made. The midline fascia was incised and the Hasson technique was used to introduce a 12 mm port under direct vision. It was secured with a figure of eight Vicryl suture placed in the usual fashion. Pneumoperitoneum was then created with CO2 and tolerated well without any adverse changes in the patient's vital signs. Additional trocars were introduced under direct vision with an 11 mm trocar in the epigastrium and 2 5 mm trocars in the right upper quadrant. All skin incisions were infiltrated with a local anesthetic agent before making the incision and placing the trocars.   The gallbladder was identified, the fundus grasped and retracted cephalad. Adhesions were lysed bluntly and with the electrocautery where indicated, taking care not to injure any adjacent organs or viscus. The infundibulum was grasped and retracted laterally, exposing the peritoneum overlying the triangle of Calot. This was then divided and exposed in a blunt fashion. The cystic duct was clearly identified and bluntly dissected circumferentially. The junctions of the gallbladder, cystic duct and common bile duct were clearly identified prior to the division of any linear structure.   An incision was made in the cystic duct and the cholangiogram catheter introduced. The catheter was secured using an endoclip. The study showed no stones and good visualization of the distal and proximal biliary tree. The catheter was then removed.   The cystic duct was  then  ligated with surgical clips  on the patient side and  clipped on the gallbladder side and divided. The cystic artery was identified, dissected free, ligated with clips and divided as well. Posterior cystic artery clipped and divided.  The gallbladder was dissected from the liver bed in retrograde fashion with the electrocautery. The gallbladder was removed via endocatch bag through umbilical port.  Surgicel placed in liver bed.  The liver bed was irrigated and inspected. Hemostasis was achieved with the electrocautery. Copious irrigation was utilized and was repeatedly aspirated until clear all particulate matter. Hemostasis was achieved with no signs  Of bleeding or bile leakage. Four quadrant laparoscopy normal at this point. Closed umbilical l port with O vicryl times 2 with suture passer.  Pneumoperitoneum was completely reduced after viewing removal of the trocars under direct vision. The wound was thoroughly irrigated and the  the skin was then closed with 4 O monocryl  and a sterile dressing was applied of Dermabond.   Instrument, sponge, and needle counts were correct at closure and at the conclusion of the case.   Findings: Cholelithiasis  Estimated Blood Loss: Minimal         Drains: none         Total IV Fluids: 500 mL         Specimens: Gallbladder           Complications: None; patient tolerated the procedure well.         Disposition: PACU - hemodynamically stable.         Condition: stable

## 2013-10-26 NOTE — Anesthesia Preprocedure Evaluation (Addendum)
Anesthesia Evaluation  Patient identified by MRN, date of birth, ID band Patient awake    Reviewed: Allergy & Precautions, H&P , NPO status , Patient's Chart, lab work & pertinent test results  Airway Mallampati: II TM Distance: >3 FB Neck ROM: Full    Dental  (+) Poor Dentition, Dental Advisory Given   Pulmonary former smoker,          Cardiovascular hypertension, Pt. on medications     Neuro/Psych  Neuromuscular disease    GI/Hepatic   Endo/Other  diabetes, Type 2, Insulin Dependent  Renal/GU      Musculoskeletal   Abdominal   Peds  Hematology   Anesthesia Other Findings   Reproductive/Obstetrics                          Anesthesia Physical Anesthesia Plan  ASA: III  Anesthesia Plan: General   Post-op Pain Management:    Induction: Intravenous  Airway Management Planned: Oral ETT  Additional Equipment:   Intra-op Plan:   Post-operative Plan: Extubation in OR  Informed Consent: I have reviewed the patients History and Physical, chart, labs and discussed the procedure including the risks, benefits and alternatives for the proposed anesthesia with the patient or authorized representative who has indicated his/her understanding and acceptance.   Dental advisory given  Plan Discussed with: CRNA, Anesthesiologist and Surgeon  Anesthesia Plan Comments:        Anesthesia Quick Evaluation

## 2013-10-26 NOTE — H&P (Signed)
Transplants    None    Demographics Brian Zavala 63 year old male  [address]  [place] [address] [phone] (H)  Comm Pref: None [address]  [place] [address] [phone] (H)   Problem ListHospitalization ProblemNon-Hospital  Cellulitis of perineum  Epidermal inclusion cyst  Post-operative state  Gallstones  Non-intractable cyclical vomiting with nausea  Significant History/Details  Smoking: Former Smoker (Quit Date:01/03/2012), 2 ppd, 70 pack-years  Smokeless Tobacco: Never Used  Alcohol: No  3 open orders  Preferred Language: English  Dialysis HistoryNone   Currently admitted as of 8/25/2015Specialty CommentsEditShow AllReport09/29/14-Pt signed PHI: Jacque Williams(Sister)-[birth year]--- PER [facility] network paid at 100% contracted rate approval # [id number] / # [id number] call clinic # [phone] with sx date/kh Made billing aware cynthia working on with kay and carol/ kh 12/01/12 12/02/12  DOS: 10/7/14TC-CDS-OP-exc cyst perineum/choice [id number] [facility]  12/02/2012 patient scheduled for op surgery 12/08/2012 @ [facility] no precert required. (kh,chm)  10-12-13 pt op sx schd 10-26-13 @ [facility] no precert required for codes given per Lamar Laundry [facility]) who stated it was approved when appt was made.(de, tvp) DOS 10/26/13 TC/-[facility]-OP- Lap chole w/IOC/de 10/21/13 70623, 76283   Medications (Discharged within 1 Day(s))Hospital Medications Outpatient Medications  ceFAZolin (ANCEF) IVPB 2 g/50 mL premix  lactated ringers infusion    Preferred Labs   None   Transplant-Related Biopsies (11 years) ** None **  Patient Blood Type (50 years)   None                                 Recent Visits (Maximum of 10 visits)Date Type Provider Description  10/26/2013 Surgery CORNETT,THOMAS A., MD   10/11/2013 Telephone Brennan Bailey, CMA   10/04/2013 Telephone Elliot Cousin, CMA   09/23/2013 Telephone Louie Bun, RN   09/23/2013 Office  Visit Harriette Bouillon A., MD Gallstones (Primary Dx); Non-Intractable Cyclical Vomiting W...  01/22/2013 Office Visit Harriette Bouillon A., MD Cellulitis of Perineum (Primary Dx)  12/21/2012 Office Visit Dortha Schwalbe., MD Post-Operative State (Primary Dx)  12/15/2012 Telephone Brennan Bailey, CMA   12/10/2012 Telephone Foust, Vonita Moss, RN   12/08/2012 Telephone Consuelo Pandy, RN          My Last Outpatient Progress NoteStatus Last Edited Encounter Date  Signed Thu Sep 23, 2013 12:28 PM EDT 09/23/2013  Patient ID: Brian Zavala, male   DOB: 1950/09/07, 62 y.o.   MRN: [id number]    Chief Complaint   Patient presents with   .  eval gallstones      HPI Brian Zavala is a 63 y.o. male.  Patient sent at the request of Jethro Bastos, MD For multi-month history of nausea and vomiting. Patient states that he has had episodic episodes of nausea and vomiting especially at night. Unclear if it makes it better or worse. Fatty foods do not seem to bother him. Denies any significant abdominal pain with these episodes. It does not happen every night and is quite predictable. Patient is not losing weight. Ultrasound was done which shows gallstones without complicating feature. HPI    Past Medical History   Diagnosis  Date   .  Diabetes mellitus without complication     .  Hypertension     .  Neuropathy     .  S/P BKA (below knee amputation)         right   .  Arthritis     .  Hyperlipidemia     .  Neuromuscular disorder     .  Wears glasses     .  Wears partial dentures         top and bottom partials       Past Surgical History   Procedure  Laterality  Date   .  Leg amputation below knee    2005       right-bka   .  Back surgery    82,90       ruptured disc-lumbar   .  Toe amputation    2005       rt foot   .  Foot amputation    2005       right   .  Scar revision    2005       rt bka   .  Cyst excision perineal  N/A  12/08/2012       Procedure: CYST EXCISION PERINeum;   Surgeon: Maisie Fus A. Cornett, MD;  Location: [facility];  Service: General;  Laterality: N/A;      History reviewed. No pertinent family history.   Social History History   Substance Use Topics   .  Smoking status:  Former Smoker       Quit date:  01/03/2012   .  Smokeless tobacco:  Never Used   .  Alcohol Use:  No      No Known Allergies    Current Outpatient Prescriptions   Medication  Sig  Dispense  Refill   .  aspirin 81 MG tablet  Take 81 mg by mouth daily.         .  Calcium Carbonate-Vitamin D (CALCIUM + D PO)  Take 1 tablet by mouth daily.         .  cholecalciferol (VITAMIN D) 1000 UNITS tablet  Take 1,000 Units by mouth daily.         Marland Kitchen  etanercept (ENBREL) 50 MG/ML injection  Inject 50 mg into the skin every 7 (seven) days. Wednesdays         .  glimepiride (AMARYL) 4 MG tablet  Take 4 mg by mouth daily before breakfast.          .  insulin glargine (LANTUS) 100 UNIT/ML injection  Inject 40 Units into the skin every evening.         .  leflunomide (ARAVA) 10 MG tablet  Take 10 mg by mouth daily.         .  Liraglutide (VICTOZA) 18 MG/3ML SOLN injection  Inject 1.8 mg into the skin at bedtime.         Marland Kitchen  morphine (MS CONTIN) 100 MG 12 hr tablet  Take 100 mg by mouth 2 (two) times daily.         .  NON FORMULARY  Take 30 mLs by mouth daily. proteniex         .  predniSONE (DELTASONE) 5 MG tablet  Take 2 mg by mouth daily.         .  promethazine (PHENERGAN) 25 MG suppository  Place 1 suppository (25 mg total) rectally every 6 (six) hours as needed for nausea or vomiting.   12 each   0   .  rosuvastatin (CRESTOR) 5 MG tablet  Take 5 mg by mouth daily.         .  valsartan-hydrochlorothiazide (DIOVAN-HCT) 160-12.5 MG per tablet  Take 1 tablet by mouth daily.             No current facility-administered medications for this visit.      Review of Systems Review of Systems  Constitutional: Negative.   HENT: Negative.   Eyes: Negative.   Respiratory:  Negative.   Cardiovascular: Negative.   Gastrointestinal: Positive for nausea.  Musculoskeletal: Negative.   Hematological: Negative.   Psychiatric/Behavioral: Negative.     Blood pressure 124/80, pulse 66, temperature 97.3 F (36.3 C), height 6\' 1"  (1.854 m), weight 187 lb (84.823 kg).   Physical Exam Physical Exam  Constitutional: He appears well-developed and well-nourished.  HENT:   Head: Normocephalic and atraumatic.  Eyes: Pupils are equal, round, and reactive to light. No scleral icterus.  Neck: Neck supple.  Cardiovascular: Normal rate and regular rhythm.   Pulmonary/Chest: Effort normal and breath sounds normal.  Abdominal: Soft. He exhibits no distension. There is no tenderness. There is no rebound.  Musculoskeletal: Normal range of motion.       Legs: Skin: Skin is warm and dry.    Data Reviewed U/S abdomen   Assessment Episodic nausea and vomiting   Gallstones   Type 2 diabetes mellitus   Plan Patient history atypical for biliary colic. He is diabetic and therefore I think it's best to exclude diabetic gastroparesis as a cause of his nausea first. Will obtain gastric emptying study. If normal recommend laparoscopic cholecystectomy and cholangiogram since symptoms in diabetics can be atypical. Discussed plan with family and he agreed to proceed. Recommend taking the Reglan prescribed by primary care prior to meals.The procedure has been discussed with the patient. Operative and non operative treatments have been discussed. Risks of surgery include bleeding, infection,  Common bile duct injury,  Injury to the stomach,liver, colon,small intestine, abdominal wall,  Diaphragm,  Major blood vessels,  And the need for an open procedure.  Other risks include worsening of medical problems, death,  DVT and pulmonary embolism, and cardiovascular events.   Medical options have also been discussed. The patient has been informed of long term expectations of surgery and non  surgical options,  The patient agrees to proceed.         CORNETT,THOMAS A.

## 2013-10-26 NOTE — Progress Notes (Signed)
Tonya RN from short stay will call back when she has a bed available for pt

## 2013-10-26 NOTE — Transfer of Care (Signed)
Immediate Anesthesia Transfer of Care Note  Patient: Brian Zavala  Procedure(s) Performed: Procedure(s): LAPAROSCOPIC CHOLECYSTECTOMY WITH INTRAOPERATIVE CHOLANGIOGRAM (N/A)  Patient Location: PACU  Anesthesia Type:General  Level of Consciousness: sedated  Airway & Oxygen Therapy: Patient Spontanous Breathing and Patient connected to face mask oxygen  Post-op Assessment: Report given to PACU RN and Post -op Vital signs reviewed and stable  Post vital signs: Reviewed and stable  Complications: No apparent anesthesia complications

## 2013-10-26 NOTE — Anesthesia Procedure Notes (Signed)
Procedure Name: Intubation Date/Time: 10/26/2013 9:38 AM Performed by: Brien Mates D Pre-anesthesia Checklist: Patient identified, Emergency Drugs available, Suction available, Patient being monitored and Timeout performed Patient Re-evaluated:Patient Re-evaluated prior to inductionOxygen Delivery Method: Circle system utilized Preoxygenation: Pre-oxygenation with 100% oxygen Intubation Type: IV induction Ventilation: Mask ventilation without difficulty and Oral airway inserted - appropriate to patient size Laryngoscope Size: Miller and 2 Grade View: Grade I Tube type: Oral Tube size: 7.5 mm Number of attempts: 1 Airway Equipment and Method: Stylet Placement Confirmation: ETT inserted through vocal cords under direct vision,  positive ETCO2 and breath sounds checked- equal and bilateral Secured at: 22 cm Tube secured with: Tape Dental Injury: Teeth and Oropharynx as per pre-operative assessment

## 2013-10-27 ENCOUNTER — Encounter (HOSPITAL_COMMUNITY): Payer: Self-pay | Admitting: Surgery

## 2013-11-16 ENCOUNTER — Encounter (INDEPENDENT_AMBULATORY_CARE_PROVIDER_SITE_OTHER): Payer: PRIVATE HEALTH INSURANCE | Admitting: Surgery

## 2014-02-15 ENCOUNTER — Other Ambulatory Visit: Payer: Self-pay | Admitting: Orthopedic Surgery

## 2014-09-30 ENCOUNTER — Ambulatory Visit
Admission: RE | Admit: 2014-09-30 | Discharge: 2014-09-30 | Disposition: A | Discharge status: Home / Self Care | Payer: Medicare (Managed Care) | Source: Ambulatory Visit | Attending: Internal Medicine | Admitting: Internal Medicine

## 2014-09-30 ENCOUNTER — Other Ambulatory Visit: Payer: Self-pay | Admitting: Internal Medicine

## 2014-09-30 DIAGNOSIS — R059 Cough, unspecified: Secondary | ICD-10-CM

## 2014-09-30 DIAGNOSIS — R05 Cough: Secondary | ICD-10-CM

## 2014-12-01 ENCOUNTER — Other Ambulatory Visit: Payer: Self-pay | Admitting: Family Medicine

## 2014-12-01 ENCOUNTER — Ambulatory Visit
Admission: RE | Admit: 2014-12-01 | Discharge: 2014-12-01 | Disposition: A | Discharge status: Home / Self Care | Payer: Medicare (Managed Care) | Source: Ambulatory Visit | Attending: Family Medicine | Admitting: Family Medicine

## 2014-12-01 DIAGNOSIS — M85652 Other cyst of bone, left thigh: Secondary | ICD-10-CM

## 2014-12-02 ENCOUNTER — Encounter (HOSPITAL_COMMUNITY): Payer: Self-pay

## 2014-12-02 ENCOUNTER — Emergency Department (HOSPITAL_COMMUNITY): Payer: Medicare (Managed Care)

## 2014-12-02 ENCOUNTER — Inpatient Hospital Stay (HOSPITAL_COMMUNITY): Payer: Medicare (Managed Care) | Admitting: Anesthesiology

## 2014-12-02 ENCOUNTER — Encounter (HOSPITAL_COMMUNITY)
Admission: EM | Disposition: A | Discharge status: Home / Self Care | Payer: Self-pay | Source: Home / Self Care | Attending: Oncology

## 2014-12-02 ENCOUNTER — Inpatient Hospital Stay (HOSPITAL_COMMUNITY)
Admission: EM | Admit: 2014-12-02 | Discharge: 2014-12-04 | DRG: 581 | Disposition: A | Discharge status: Home / Self Care | Payer: Medicare (Managed Care) | Attending: Oncology | Admitting: Oncology

## 2014-12-02 DIAGNOSIS — Z794 Long term (current) use of insulin: Secondary | ICD-10-CM | POA: Diagnosis not present

## 2014-12-02 DIAGNOSIS — B9689 Other specified bacterial agents as the cause of diseases classified elsewhere: Secondary | ICD-10-CM

## 2014-12-02 DIAGNOSIS — E785 Hyperlipidemia, unspecified: Secondary | ICD-10-CM | POA: Diagnosis present

## 2014-12-02 DIAGNOSIS — Z79891 Long term (current) use of opiate analgesic: Secondary | ICD-10-CM

## 2014-12-02 DIAGNOSIS — Z87891 Personal history of nicotine dependence: Secondary | ICD-10-CM | POA: Diagnosis not present

## 2014-12-02 DIAGNOSIS — Z7952 Long term (current) use of systemic steroids: Secondary | ICD-10-CM

## 2014-12-02 DIAGNOSIS — L02215 Cutaneous abscess of perineum: Principal | ICD-10-CM | POA: Diagnosis present

## 2014-12-02 DIAGNOSIS — J449 Chronic obstructive pulmonary disease, unspecified: Secondary | ICD-10-CM | POA: Diagnosis present

## 2014-12-02 DIAGNOSIS — M069 Rheumatoid arthritis, unspecified: Secondary | ICD-10-CM | POA: Diagnosis not present

## 2014-12-02 DIAGNOSIS — I10 Essential (primary) hypertension: Secondary | ICD-10-CM | POA: Diagnosis present

## 2014-12-02 DIAGNOSIS — R000 Tachycardia, unspecified: Secondary | ICD-10-CM | POA: Diagnosis present

## 2014-12-02 DIAGNOSIS — L0291 Cutaneous abscess, unspecified: Secondary | ICD-10-CM | POA: Diagnosis present

## 2014-12-02 DIAGNOSIS — E1165 Type 2 diabetes mellitus with hyperglycemia: Secondary | ICD-10-CM | POA: Diagnosis present

## 2014-12-02 DIAGNOSIS — E1151 Type 2 diabetes mellitus with diabetic peripheral angiopathy without gangrene: Secondary | ICD-10-CM

## 2014-12-02 DIAGNOSIS — IMO0001 Reserved for inherently not codable concepts without codable children: Secondary | ICD-10-CM | POA: Insufficient documentation

## 2014-12-02 DIAGNOSIS — Z79899 Other long term (current) drug therapy: Secondary | ICD-10-CM

## 2014-12-02 DIAGNOSIS — E119 Type 2 diabetes mellitus without complications: Secondary | ICD-10-CM

## 2014-12-02 DIAGNOSIS — Z89519 Acquired absence of unspecified leg below knee: Secondary | ICD-10-CM | POA: Insufficient documentation

## 2014-12-02 DIAGNOSIS — Z89511 Acquired absence of right leg below knee: Secondary | ICD-10-CM | POA: Diagnosis not present

## 2014-12-02 DIAGNOSIS — M1990 Unspecified osteoarthritis, unspecified site: Secondary | ICD-10-CM | POA: Diagnosis present

## 2014-12-02 DIAGNOSIS — E876 Hypokalemia: Secondary | ICD-10-CM | POA: Diagnosis present

## 2014-12-02 DIAGNOSIS — E1140 Type 2 diabetes mellitus with diabetic neuropathy, unspecified: Secondary | ICD-10-CM | POA: Insufficient documentation

## 2014-12-02 DIAGNOSIS — E1142 Type 2 diabetes mellitus with diabetic polyneuropathy: Secondary | ICD-10-CM

## 2014-12-02 HISTORY — PX: INCISION AND DRAINAGE PERIRECTAL ABSCESS: SHX1804

## 2014-12-02 HISTORY — DX: Type 2 diabetes mellitus without complications: E11.9

## 2014-12-02 HISTORY — PX: INCISE AND DRAIN ABCESS: PRO64

## 2014-12-02 HISTORY — DX: Chronic obstructive pulmonary disease, unspecified: J44.9

## 2014-12-02 LAB — CBC WITH DIFFERENTIAL/PLATELET
Basophils Absolute: 0 10*3/uL (ref 0.0–0.1)
Basophils Relative: 0 %
Eosinophils Absolute: 0 10*3/uL (ref 0.0–0.7)
Eosinophils Relative: 0 %
HCT: 41.8 % (ref 39.0–52.0)
Hemoglobin: 14.8 g/dL (ref 13.0–17.0)
Lymphocytes Relative: 6 %
Lymphs Abs: 0.8 10*3/uL (ref 0.7–4.0)
MCH: 29.7 pg (ref 26.0–34.0)
MCHC: 35.4 g/dL (ref 30.0–36.0)
MCV: 83.9 fL (ref 78.0–100.0)
Monocytes Absolute: 1.3 10*3/uL — ABNORMAL HIGH (ref 0.1–1.0)
Monocytes Relative: 9 %
Neutro Abs: 11.8 10*3/uL — ABNORMAL HIGH (ref 1.7–7.7)
Neutrophils Relative %: 85 %
Platelets: 255 10*3/uL (ref 150–400)
RBC: 4.98 MIL/uL (ref 4.22–5.81)
RDW: 13.5 % (ref 11.5–15.5)
WBC: 14 10*3/uL — ABNORMAL HIGH (ref 4.0–10.5)

## 2014-12-02 LAB — I-STAT CREATININE, ED: Creatinine, Ser: 0.7 mg/dL (ref 0.61–1.24)

## 2014-12-02 LAB — COMPREHENSIVE METABOLIC PANEL
ALT: 17 U/L (ref 17–63)
AST: 23 U/L (ref 15–41)
Albumin: 3 g/dL — ABNORMAL LOW (ref 3.5–5.0)
Alkaline Phosphatase: 66 U/L (ref 38–126)
Anion gap: 13 (ref 5–15)
BUN: 10 mg/dL (ref 6–20)
CO2: 27 mmol/L (ref 22–32)
Calcium: 9 mg/dL (ref 8.9–10.3)
Chloride: 89 mmol/L — ABNORMAL LOW (ref 101–111)
Creatinine, Ser: 0.89 mg/dL (ref 0.61–1.24)
GFR calc Af Amer: >60 mL/min (ref >60)
GFR calc non Af Amer: >60 mL/min (ref >60)
Glucose, Bld: 344 mg/dL — ABNORMAL HIGH (ref 65–99)
Potassium: 3.1 mmol/L — ABNORMAL LOW (ref 3.5–5.1)
Sodium: 129 mmol/L — ABNORMAL LOW (ref 135–145)
Total Bilirubin: 1.3 mg/dL — ABNORMAL HIGH (ref 0.3–1.2)
Total Protein: 7.1 g/dL (ref 6.5–8.1)

## 2014-12-02 LAB — GLUCOSE, CAPILLARY: Glucose-Capillary: 266 mg/dL — ABNORMAL HIGH (ref 65–99)

## 2014-12-02 LAB — I-STAT CG4 LACTIC ACID, ED: Lactic Acid, Venous: 2.8 mmol/L (ref 0.5–2.0)

## 2014-12-02 LAB — CBG MONITORING, ED: Glucose-Capillary: 299 mg/dL — ABNORMAL HIGH (ref 65–99)

## 2014-12-02 SURGERY — INCISION AND DRAINAGE, ABSCESS, PERIRECTAL
Anesthesia: General | Site: Rectum | Laterality: Left

## 2014-12-02 MED ORDER — SUFENTANIL CITRATE 50 MCG/ML IV SOLN
INTRAVENOUS | Status: DC | PRN
  Administered 2014-12-02: 5 ug via INTRAVENOUS
  Administered 2014-12-02: 15 ug via INTRAVENOUS

## 2014-12-02 MED ORDER — PROMETHAZINE HCL 25 MG/ML IJ SOLN
6.2500 mg | INTRAMUSCULAR | Status: DC | PRN
Start: 2014-12-02 — End: 2014-12-03

## 2014-12-02 MED ORDER — PROPOFOL 10 MG/ML IV BOLUS
INTRAVENOUS | Status: DC | PRN
  Administered 2014-12-02: 30 mg via INTRAVENOUS
  Administered 2014-12-02: 120 mg via INTRAVENOUS

## 2014-12-02 MED ORDER — DEXTROSE 5 % IV SOLN
2.0000 g | INTRAVENOUS | Status: DC
  Administered 2014-12-02 – 2014-12-03 (×2): 2 g via INTRAVENOUS
  Filled 2014-12-02 (×3): qty 2

## 2014-12-02 MED ORDER — SODIUM CHLORIDE 0.9 % IV SOLN
3.0000 g | Freq: Once | INTRAVENOUS | Status: DC
  Administered 2014-12-02: 3 g via INTRAVENOUS
  Filled 2014-12-02: qty 3

## 2014-12-02 MED ORDER — BUPIVACAINE-EPINEPHRINE 0.25% -1:200000 IJ SOLN
INTRAMUSCULAR | Status: DC | PRN
  Administered 2014-12-02: 20 mL

## 2014-12-02 MED ORDER — ENOXAPARIN SODIUM 40 MG/0.4ML ~~LOC~~ SOLN
40.0000 mg | SUBCUTANEOUS | Status: DC
  Administered 2014-12-03: 40 mg via SUBCUTANEOUS
  Filled 2014-12-02: qty 0.4

## 2014-12-02 MED ORDER — HYDROMORPHONE HCL 1 MG/ML IJ SOLN
0.2500 mg | INTRAMUSCULAR | Status: DC | PRN
  Administered 2014-12-03: 0.5 mg via INTRAVENOUS
  Filled 2014-12-02: qty 1

## 2014-12-02 MED ORDER — MIDAZOLAM HCL 5 MG/5ML IJ SOLN
INTRAMUSCULAR | Status: DC | PRN
  Administered 2014-12-02: 2 mg via INTRAVENOUS

## 2014-12-02 MED ORDER — ONDANSETRON HCL 4 MG/2ML IJ SOLN
4.0000 mg | Freq: Once | INTRAMUSCULAR | Status: AC
  Administered 2014-12-02: 4 mg via INTRAVENOUS
  Filled 2014-12-02: qty 2

## 2014-12-02 MED ORDER — IOHEXOL 300 MG/ML  SOLN
100.0000 mL | Freq: Once | INTRAMUSCULAR | Status: AC | PRN
  Administered 2014-12-02: 100 mL via INTRAVENOUS

## 2014-12-02 MED ORDER — HYDROCHLOROTHIAZIDE 12.5 MG PO CAPS
12.5000 mg | ORAL_CAPSULE | Freq: Every day | ORAL | Status: DC
  Administered 2014-12-03 – 2014-12-04 (×2): 12.5 mg via ORAL
  Filled 2014-12-02 (×2): qty 1

## 2014-12-02 MED ORDER — POTASSIUM CHLORIDE IN NACL 20-0.9 MEQ/L-% IV SOLN
INTRAVENOUS | Status: DC
  Administered 2014-12-02 – 2014-12-04 (×4): via INTRAVENOUS
  Filled 2014-12-02 (×4): qty 1000

## 2014-12-02 MED ORDER — SUCCINYLCHOLINE CHLORIDE 20 MG/ML IJ SOLN
INTRAMUSCULAR | Status: DC | PRN
  Administered 2014-12-02: 100 mg via INTRAVENOUS

## 2014-12-02 MED ORDER — ATORVASTATIN CALCIUM 10 MG PO TABS
10.0000 mg | ORAL_TABLET | Freq: Every day | ORAL | Status: DC
  Administered 2014-12-03: 10 mg via ORAL
  Filled 2014-12-02: qty 1

## 2014-12-02 MED ORDER — HYDROCODONE-ACETAMINOPHEN 5-325 MG PO TABS
1.0000 | ORAL_TABLET | ORAL | Status: DC | PRN
  Administered 2014-12-03 (×2): 1 via ORAL
  Filled 2014-12-02 (×2): qty 1

## 2014-12-02 MED ORDER — LACTATED RINGERS IV SOLN
INTRAVENOUS | Status: DC | PRN
  Administered 2014-12-02: 20:00:00 via INTRAVENOUS

## 2014-12-02 MED ORDER — METRONIDAZOLE IN NACL 5-0.79 MG/ML-% IV SOLN
500.0000 mg | Freq: Four times a day (QID) | INTRAVENOUS | Status: DC
  Administered 2014-12-02 – 2014-12-04 (×8): 500 mg via INTRAVENOUS
  Filled 2014-12-02 (×10): qty 100

## 2014-12-02 MED ORDER — PREDNISONE 1 MG PO TABS
2.0000 mg | ORAL_TABLET | Freq: Every day | ORAL | Status: DC
  Administered 2014-12-03 – 2014-12-04 (×2): 2 mg via ORAL
  Filled 2014-12-02 (×3): qty 2

## 2014-12-02 MED ORDER — DEXAMETHASONE SODIUM PHOSPHATE 4 MG/ML IJ SOLN
INTRAMUSCULAR | Status: AC
  Filled 2014-12-02: qty 1

## 2014-12-02 MED ORDER — MORPHINE SULFATE (PF) 2 MG/ML IV SOLN
1.0000 mg | INTRAVENOUS | Status: DC | PRN
  Administered 2014-12-02: 4 mg via INTRAVENOUS
  Filled 2014-12-02: qty 1
  Filled 2014-12-02: qty 2

## 2014-12-02 MED ORDER — INSULIN ASPART 100 UNIT/ML ~~LOC~~ SOLN
10.0000 [IU] | Freq: Once | SUBCUTANEOUS | Status: AC
  Administered 2014-12-02: 10 [IU] via SUBCUTANEOUS
  Filled 2014-12-02: qty 1

## 2014-12-02 MED ORDER — SODIUM CHLORIDE 0.9 % IJ SOLN
INTRAMUSCULAR | Status: AC
  Filled 2014-12-02: qty 10

## 2014-12-02 MED ORDER — MORPHINE SULFATE (PF) 2 MG/ML IV SOLN
1.0000 mg | INTRAVENOUS | Status: DC | PRN
  Administered 2014-12-02 – 2014-12-03 (×4): 4 mg via INTRAVENOUS
  Administered 2014-12-03: 2 mg via INTRAVENOUS
  Administered 2014-12-03 – 2014-12-04 (×9): 4 mg via INTRAVENOUS
  Filled 2014-12-02: qty 2
  Filled 2014-12-02: qty 1
  Filled 2014-12-02 (×5): qty 2
  Filled 2014-12-02: qty 1
  Filled 2014-12-02 (×7): qty 2

## 2014-12-02 MED ORDER — INSULIN GLARGINE 100 UNIT/ML ~~LOC~~ SOLN
15.0000 [IU] | Freq: Every evening | SUBCUTANEOUS | Status: DC
  Administered 2014-12-02: 15 [IU] via SUBCUTANEOUS
  Filled 2014-12-02 (×3): qty 0.15

## 2014-12-02 MED ORDER — ASPIRIN 81 MG PO TABS
81.0000 mg | ORAL_TABLET | Freq: Every day | ORAL | Status: DC
  Administered 2014-12-03 – 2014-12-04 (×2): 81 mg via ORAL
  Filled 2014-12-02 (×4): qty 1

## 2014-12-02 MED ORDER — MIDAZOLAM HCL 2 MG/2ML IJ SOLN
INTRAMUSCULAR | Status: AC
  Filled 2014-12-02: qty 4

## 2014-12-02 MED ORDER — INSULIN ASPART 100 UNIT/ML ~~LOC~~ SOLN
SUBCUTANEOUS | Status: AC
Start: 2014-12-02 — End: 2014-12-03
  Filled 2014-12-02: qty 5

## 2014-12-02 MED ORDER — 0.9 % SODIUM CHLORIDE (POUR BTL) OPTIME
TOPICAL | Status: DC | PRN
  Administered 2014-12-02: 1000 mL

## 2014-12-02 MED ORDER — PROPOFOL 10 MG/ML IV BOLUS
INTRAVENOUS | Status: AC
  Filled 2014-12-02: qty 20

## 2014-12-02 MED ORDER — DEXAMETHASONE SODIUM PHOSPHATE 4 MG/ML IJ SOLN
INTRAMUSCULAR | Status: DC | PRN
  Administered 2014-12-02: 4 mg via INTRAVENOUS

## 2014-12-02 MED ORDER — BUPIVACAINE-EPINEPHRINE (PF) 0.25% -1:200000 IJ SOLN
INTRAMUSCULAR | Status: AC
  Filled 2014-12-02: qty 30

## 2014-12-02 MED ORDER — LIDOCAINE HCL (CARDIAC) 20 MG/ML IV SOLN
INTRAVENOUS | Status: DC | PRN
  Administered 2014-12-02: 100 mg via INTRAVENOUS

## 2014-12-02 MED ORDER — SODIUM CHLORIDE 0.9 % IV BOLUS (SEPSIS)
1000.0000 mL | Freq: Once | INTRAVENOUS | Status: AC
  Administered 2014-12-02: 1000 mL via INTRAVENOUS

## 2014-12-02 MED ORDER — MORPHINE SULFATE (PF) 4 MG/ML IV SOLN
4.0000 mg | Freq: Once | INTRAVENOUS | Status: AC
  Administered 2014-12-02: 4 mg via INTRAVENOUS
  Filled 2014-12-02: qty 1

## 2014-12-02 MED ORDER — ONDANSETRON HCL 4 MG/2ML IJ SOLN: 4.0000 mg | Freq: Three times a day (TID) | INTRAMUSCULAR | Status: DC | PRN

## 2014-12-02 MED ORDER — SUFENTANIL CITRATE 50 MCG/ML IV SOLN
INTRAVENOUS | Status: AC
  Filled 2014-12-02: qty 1

## 2014-12-02 MED ORDER — PREGABALIN 75 MG PO CAPS
75.0000 mg | ORAL_CAPSULE | Freq: Two times a day (BID) | ORAL | Status: DC
  Administered 2014-12-03 – 2014-12-04 (×3): 75 mg via ORAL
  Filled 2014-12-02 (×3): qty 1

## 2014-12-02 MED ORDER — LIDOCAINE HCL (CARDIAC) 20 MG/ML IV SOLN
INTRAVENOUS | Status: AC
  Filled 2014-12-02: qty 5

## 2014-12-02 MED ORDER — LEFLUNOMIDE 10 MG PO TABS
10.0000 mg | ORAL_TABLET | Freq: Every day | ORAL | Status: DC
  Administered 2014-12-03 – 2014-12-04 (×2): 10 mg via ORAL
  Filled 2014-12-02 (×2): qty 1

## 2014-12-02 MED ORDER — INSULIN ASPART 100 UNIT/ML ~~LOC~~ SOLN
0.0000 [IU] | SUBCUTANEOUS | Status: DC
  Administered 2014-12-02 – 2014-12-03 (×2): 5 [IU] via SUBCUTANEOUS
  Administered 2014-12-03 (×2): 8 [IU] via SUBCUTANEOUS

## 2014-12-02 SURGICAL SUPPLY — 37 items
BNDG GAUZE ELAST 4 BULKY (GAUZE/BANDAGES/DRESSINGS) ×2 IMPLANT
CANISTER SUCTION 2500CC (MISCELLANEOUS) ×2 IMPLANT
COVER SURGICAL LIGHT HANDLE (MISCELLANEOUS) ×2 IMPLANT
DRAPE UTILITY XL STRL (DRAPES) ×4 IMPLANT
DRSG PAD ABDOMINAL 8X10 ST (GAUZE/BANDAGES/DRESSINGS) ×2 IMPLANT
ELECT CAUTERY BLADE 6.4 (BLADE) ×2 IMPLANT
ELECT REM PT RETURN 9FT ADLT (ELECTROSURGICAL) ×2
ELECTRODE REM PT RTRN 9FT ADLT (ELECTROSURGICAL) ×1 IMPLANT
GAUZE PACKING IODOFORM 1 (PACKING) IMPLANT
GAUZE SPONGE 4X4 12PLY STRL (GAUZE/BANDAGES/DRESSINGS) ×2 IMPLANT
GLOVE BIOGEL PI IND STRL 6.5 (GLOVE) ×1 IMPLANT
GLOVE BIOGEL PI INDICATOR 6.5 (GLOVE) ×1
GLOVE SURG SIGNA 7.5 PF LTX (GLOVE) ×2 IMPLANT
GLOVE SURG SS PI 6.5 STRL IVOR (GLOVE) ×2 IMPLANT
GOWN STRL REUS W/ TWL LRG LVL3 (GOWN DISPOSABLE) ×1 IMPLANT
GOWN STRL REUS W/ TWL XL LVL3 (GOWN DISPOSABLE) ×1 IMPLANT
GOWN STRL REUS W/TWL LRG LVL3 (GOWN DISPOSABLE) ×1
GOWN STRL REUS W/TWL XL LVL3 (GOWN DISPOSABLE) ×1
KIT BASIN OR (CUSTOM PROCEDURE TRAY) ×2 IMPLANT
KIT ROOM TURNOVER OR (KITS) ×2 IMPLANT
NEEDLE 22X1 1/2 (OR ONLY) (NEEDLE) ×2 IMPLANT
NS IRRIG 1000ML POUR BTL (IV SOLUTION) ×2 IMPLANT
PACK LITHOTOMY IV (CUSTOM PROCEDURE TRAY) ×2 IMPLANT
PAD ABD 8X10 STRL (GAUZE/BANDAGES/DRESSINGS) ×2 IMPLANT
PAD ARMBOARD 7.5X6 YLW CONV (MISCELLANEOUS) ×2 IMPLANT
PENCIL BUTTON HOLSTER BLD 10FT (ELECTRODE) ×2 IMPLANT
SPONGE GAUZE 4X4 12PLY STER LF (GAUZE/BANDAGES/DRESSINGS) ×2 IMPLANT
SPONGE LAP 18X18 X RAY DECT (DISPOSABLE) ×2 IMPLANT
SWAB COLLECTION DEVICE MRSA (MISCELLANEOUS) IMPLANT
SYR BULB 3OZ (MISCELLANEOUS) ×2 IMPLANT
SYR CONTROL 10ML LL (SYRINGE) ×2 IMPLANT
TOWEL OR 17X24 6PK STRL BLUE (TOWEL DISPOSABLE) ×2 IMPLANT
TOWEL OR 17X26 10 PK STRL BLUE (TOWEL DISPOSABLE) ×2 IMPLANT
TUBE ANAEROBIC SPECIMEN COL (MISCELLANEOUS) IMPLANT
TUBE CONNECTING 12X1/4 (SUCTIONS) ×2 IMPLANT
UNDERPAD 30X30 INCONTINENT (UNDERPADS AND DIAPERS) ×2 IMPLANT
YANKAUER SUCT BULB TIP NO VENT (SUCTIONS) ×4 IMPLANT

## 2014-12-02 NOTE — Consult Note (Signed)
[facility] Surgery Consult Note  Brian Zavala 07-Jan-1951  [id number].    Requesting MD: Dr. Rogene Houston Chief Complaint/Reason for Consult: Perianal abscess  HPI:  64 y/o white male with PMH DM, HTN, RA presents with a "cyst" on his bottom/groin with pain and swelling which started on Monday.  Notes pain, swelling, redness now on left perineum/groin.  His symptoms have progressively worsened over the week (now 10/10 pain) which brought him to [facility].  He has had this before.  He had one on the right in 2014 drained by Dr. Brantley Stage.  No fever/chills, N/V/D, abdominal pain, or CP/SOB.  Denies any drainage.  No pus/blood per rectum.  Takes Enbrel for RA, last dose last wed.  No h/o IBD.  Tried advil without relief.  Last meal 8am.  No other precipitating/alleviating factors.  Diabetes reportedly has been well controlled.  Get[facility] seen at [facility].  Lap chole 2015, right BKA.  CT shows 8.3cm left perineal abscess.  WBC 14K, CBG 344.  ROS: All systems reviewed and otherwise negative except for as above  History reviewed. No pertinent family history.  Past Medical History  Diagnosis Date  . Diabetes mellitus without complication   . Hypertension   . Neuropathy   . S/P BKA (below knee amputation)     right  . Arthritis   . Hyperlipidemia   . Neuromuscular disorder   . Wears glasses   . Wears partial dentures     top and bottom partials    Past Surgical History  Procedure Laterality Date  . Leg amputation below knee  2005    right-bka  . Back surgery  82,90    ruptured disc-lumbar  . Toe amputation  2005    rt foot  . Foot amputation  2005    right  . Scar revision  2005    rt bka  . Cyst excision perineal N/A 12/08/2012    Procedure: CYST EXCISION PERINeum;  Surgeon: Marcello Moores A. Cornett, MD;  Location: [place];  Service: General;  Laterality: N/A;  . Cholecystectomy N/A 10/26/2013    Procedure: LAPAROSCOPIC CHOLECYSTECTOMY WITH  INTRAOPERATIVE CHOLANGIOGRAM;  Surgeon: Joyice Faster. Cornett, MD;  Location: [place];  Service: General;  Laterality: N/A;    Social History:  reports that he quit smoking about 2 years ago. His smoking use included Cigarettes. He has a 70 pack-year smoking history. He has never used smokeless tobacco. He reports that he does not drink alcohol or use illicit drugs.  Allergies: No Known Allergies   (Not in a hospital admission)  Blood pressure 119/64, pulse 99, temperature 97.8 F (36.6 C), temperature source Oral, resp. rate 16, height _0  (1.88 m), weight 85.276 kg (188 lb), SpO2 95 %. Physical Exam: General: pleasant, WD/WN white male who is laying in bed in NAD HEENT: head is normocephalic, atraumatic.  Sclera are noninjected.  Ears and nose without any masses or lesions.  Mouth is pink and moist Heart: regular, rate, and rhythm.  No obvious murmurs, gallops, or rubs noted.  Palpable pedal pulses bilaterally Lungs: CTAB, no wheezes, rhonchi, or rales noted.  Respiratory effort nonlabored Abd: soft, NT/ND, +BS, no masses, hernias, or organomegaly Left perineum/groin:  Large area of fluctuance which extends to scrotum and buttock, induration and erythema present, no drainage.  MS: Right BKA, all extremities with good CSM distally Psych: A&Ox3 with an appropriate affect.   Results for orders placed or performed during the hospital encounter of  12/02/14 (from the past 48 hour(s))  CBC with Differential/Platelet     Status: Abnormal   Collection Time: 12/02/14  1:10 PM  Result Value Ref Range   WBC 14.0 (H) 4.0 - 10.5 K/uL   RBC 4.98 4.22 - 5.81 MIL/uL   Hemoglobin 14.8 13.0 - 17.0 g/dL   HCT 41.8 39.0 - 52.0 %   MCV 83.9 78.0 - 100.0 fL   MCH 29.7 26.0 - 34.0 pg   MCHC 35.4 30.0 - 36.0 g/dL   RDW 13.5 11.5 - 15.5 %   Platelets 255 150 - 400 K/uL   Neutrophils Relative % 85 %   Neutro Abs 11.8 (H) 1.7 - 7.7 K/uL   Lymphocytes Relative 6 %   Lymphs Abs 0.8 0.7 - 4.0 K/uL   Monocytes  Relative 9 %   Monocytes Absolute 1.3 (H) 0.1 - 1.0 K/uL   Eosinophils Relative 0 %   Eosinophils Absolute 0.0 0.0 - 0.7 K/uL   Basophils Relative 0 %   Basophils Absolute 0.0 0.0 - 0.1 K/uL  Comprehensive metabolic panel     Status: Abnormal   Collection Time: 12/02/14  1:10 PM  Result Value Ref Range   Sodium 129 (L) 135 - 145 mmol/L   Potassium 3.1 (L) 3.5 - 5.1 mmol/L   Chloride 89 (L) 101 - 111 mmol/L   CO2 27 22 - 32 mmol/L   Glucose, Bld 344 (H) 65 - 99 mg/dL   BUN 10 6 - 20 mg/dL   Creatinine, Ser 0.89 0.61 - 1.24 mg/dL   Calcium 9.0 8.9 - 10.3 mg/dL   Total Protein 7.1 6.5 - 8.1 g/dL   Albumin 3.0 (L) 3.5 - 5.0 g/dL   AST 23 15 - 41 U/L   ALT 17 17 - 63 U/L   Alkaline Phosphatase 66 38 - 126 U/L   Total Bilirubin 1.3 (H) 0.3 - 1.2 mg/dL   GFR calc non Af Amer >60 >60 mL/min   GFR calc Af Amer >60 >60 mL/min    Comment: (NOTE) The eGFR has been calculated using the CKD EPI equation. This calculation has not been validated in all clinical situations. eGFR's persistently <60 mL/min signify possible Chronic Kidney Disease.    Anion gap 13 5 - 15  I-Stat Creatinine, ED (do not order at [facility])     Status: None   Collection Time: 12/02/14  1:21 PM  Result Value Ref Range   Creatinine, Ser 0.70 0.61 - 1.24 mg/dL  I-Stat CG4 Lactic Acid, ED     Status: Abnormal   Collection Time: 12/02/14  2:46 PM  Result Value Ref Range   Lactic Acid, Venous 2.80 (HH) 0.5 - 2.0 mmol/L   Comment NOTIFIED PHYSICIAN   POC CBG, ED     Status: Abnormal   Collection Time: 12/02/14  3:20 PM  Result Value Ref Range   Glucose-Capillary 299 (H) 65 - 99 mg/dL   Ct Pelvis W Contrast  12/02/2014   CLINICAL DATA:  Worsening perineal pain and tenderness. Perineal cellulitis and abscess.  EXAM: CT PELVIS WITH CONTRAST  TECHNIQUE: Multidetector CT imaging of the pelvis was performed using the standard protocol following the bolus administration of intravenous contrast.  CONTRAST:  17m OMNIPAQUE IOHEXOL  300 MG/ML  SOLN  COMPARISON:  None.  FINDINGS: A complex rim enhancing fluid collection is seen in the left-sided subcutaneous tissues of the perineum which measures 4.3 x 8.3 cm. Surrounding inflammatory changes are seen. This is consistent with abscess. No soft  tissue gas is seen. This may be arising from the inferior aspect of the anal sphincter.  No intraperitoneal inflammatory process or abnormal fluid collections identified. No pelvic soft tissue masses or lymphadenopathy identified. Sigmoid diverticulosis is noted, without evidence of diverticulitis.  IMPRESSION: 8.3 cm complex fluid collection within the left perineal soft tissues consistent with abscess. This may originate from a perianal fistula. If clinically warranted, pelvic MRI without and with contrast could be performed for further/preoperative evaluation.   Electronically Signed   By: Earle Gell M.D.   On: 12/02/2014 15:27   [place] Extrem Low Left Ltd  12/01/2014   CLINICAL DATA:  Cystic lesion along the left inner thigh.  EXAM: ULTRASOUND left LOWER EXTREMITY LIMITED  TECHNIQUE: Ultrasound examination of the lower extremity soft tissues was performed in the area of clinical concern.  COMPARISON:  None  FINDINGS: 8.4 by 2.2 by 4.7 cm subcutaneous complex lesion corresponding with the palpable abnormality in the left upper inner thigh. There is some enhance through transmission and the echogenic material inside this lesion appeared to be swirling at real-time sonography. No definite internal Doppler flow.  IMPRESSION: 1. Complex cystic subcutaneous lesion corresponding to the palpable abnormality along the inner thigh, measuring 8.4 by 2.2 by 4.7 cm.   Electronically Signed   By: Van Clines M.D.   On: 12/01/2014 15:19      Assessment/Plan 8.3cm left perineal abscess -Admit to Medicine, we will consult.  Will need I&D in OR.  Discussed risks/benefits of procedure.  He would like to proceed.   -NPO, IVF, pain control, antiemetics,  antibiotics (rocephin/flagyl) -Dr. Ninfa Linden or Dr. Hulen Skains will see and evaluate -Supplemented potassium in IVF, will need blood sugars managed per medicine  Uncontrolled diabetes type 2 with neuropathy - 344 Leukocytosis Hypokalemia - supplemented  RA - on Enbrel H/o Lap chole, right BKA HTN/HLD  Nat Christen, [facility] Surgery 12/02/2014, 3:54 PM Pager: [phone]

## 2014-12-02 NOTE — ED Provider Notes (Signed)
CSN: [id number]     Arrival date & time 12/02/14  1150 History   First MD Initiated Contact with Patient 12/02/14 1213     Chief Complaint  Patient presents with  . Abscess     (Consider location/radiation/quality/duration/timing/severity/associated sxs/prior Treatment) HPI Comments: Patient with a history of DM, HTN, Right BKA presents today with an abscess of his perineum area.  He states began having pain, erythema, and swelling of the area four days ago.  He states that the pain extends to the left scrotum and the left perirectal area as well.  Symptoms have worsened since that time.  He was seen by his PCP two days ago and was prescribed Doxycycline at that time, which he reports that he is taking.  He reports that he had a similar presentation two years ago.  Review of the chart shows that at that time he had a cyst of the perineum that was treated operatively by Dr. Luisa Hart with General Surgery.  He reports that his last BM was three days ago and that he did not have any additional pain with this.  He reports associated nausea, but denies vomiting.  Denies fever or chills.  He denies any drainage from the area.  Denies any urinary symptoms.    The history is provided by the patient.    Past Medical History  Diagnosis Date  . Diabetes mellitus without complication   . Hypertension   . Neuropathy   . S/P BKA (below knee amputation)     right  . Arthritis   . Hyperlipidemia   . Neuromuscular disorder   . Wears glasses   . Wears partial dentures     top and bottom partials   Past Surgical History  Procedure Laterality Date  . Leg amputation below knee  2005    right-bka  . Back surgery  82,90    ruptured disc-lumbar  . Toe amputation  2005    rt foot  . Foot amputation  2005    right  . Scar revision  2005    rt bka  . Cyst excision perineal N/A 12/08/2012    Procedure: CYST EXCISION PERINeum;  Surgeon: Maisie Fus A. Cornett, MD;  Location: [facility];  Service:  General;  Laterality: N/A;  . Cholecystectomy N/A 10/26/2013    Procedure: LAPAROSCOPIC CHOLECYSTECTOMY WITH INTRAOPERATIVE CHOLANGIOGRAM;  Surgeon: Clovis Pu. Cornett, MD;  Location: [facility] OR;  Service: General;  Laterality: N/A;   History reviewed. No pertinent family history. Social History  Substance Use Topics  . Smoking status: Former Smoker -- 2.00 packs/day for 35 years    Types: Cigarettes    Quit date: 01/03/2012  . Smokeless tobacco: Never Used  . Alcohol Use: No    Review of Systems  All other systems reviewed and are negative.     Allergies  Review of patient's allergies indicates no known allergies.  Home Medications   Prior to Admission medications   Medication Sig Start Date End Date Taking? Authorizing Provider  aspirin 81 MG tablet Take 81 mg by mouth daily.   Yes Historical Provider, MD  atorvastatin (LIPITOR) 10 MG tablet Take 10 mg by mouth daily at 6 PM.   Yes Historical Provider, MD  Calcium Carbonate-Vitamin D (CALCIUM + D PO) Take 1 tablet by mouth every evening.    Yes Historical Provider, MD  cholecalciferol (VITAMIN D) 1000 UNITS tablet Take 1,000 Units by mouth daily.   Yes Historical Provider, MD  docusate sodium (COLACE) 100  MG capsule Take 100 mg by mouth 2 (two) times daily.   Yes Historical Provider, MD  hydrochlorothiazide (MICROZIDE) 12.5 MG capsule Take 12.5 mg by mouth daily.   Yes Historical Provider, MD  insulin glargine (LANTUS) 100 UNIT/ML injection Inject 45 Units into the skin every evening.    Yes Historical Provider, MD  leflunomide (ARAVA) 10 MG tablet Take 10 mg by mouth daily.   Yes Historical Provider, MD  Liraglutide (VICTOZA) 18 MG/3ML SOLN injection Inject 1.8 mg into the skin every evening.    Yes Historical Provider, MD  morphine (MS CONTIN) 100 MG 12 hr tablet Take 100 mg by mouth 2 (two) times daily.   Yes Historical Provider, MD  predniSONE (DELTASONE) 1 MG tablet Take 2 mg by mouth daily with breakfast.   Yes Historical  Provider, MD  pregabalin (LYRICA) 75 MG capsule Take 75 mg by mouth 2 (two) times daily.   Yes Historical Provider, MD  etanercept (ENBREL) 50 MG/ML injection Inject 50 mg into the skin every 7 (seven) days. Wednesdays    Historical Provider, MD  oxyCODONE-acetaminophen (ROXICET) 5-325 MG per tablet Take 1-2 tablets by mouth every 4 (four) hours as needed. Patient not taking: Reported on 12/02/2014 10/26/13   Harriette Bouillon, MD  predniSONE (DELTASONE) 5 MG tablet Take 2 mg by mouth daily.    Historical Provider, MD  pregabalin (LYRICA) 100 MG capsule Take 100-200 mg by mouth 2 (two) times daily. 1 capsule (100 mg) in the am and 2 capsules (200 mg) in the pm    Historical Provider, MD  valsartan-hydrochlorothiazide (DIOVAN-HCT) 160-12.5 MG per tablet Take 1 tablet by mouth daily.    Historical Provider, MD   BP 115/80 mmHg  Pulse 95  Temp(Src) 97.8 F (36.6 C) (Oral)  Resp 18  Ht 6\' 2"  (1.88 m)  Wt 188 lb (85.276 kg)  BMI 24.13 kg/m2  SpO2 92% Physical Exam  Constitutional: He appears well-developed and well-nourished.  HENT:  Head: Normocephalic and atraumatic.  Mouth/Throat: Oropharynx is clear and moist.  Neck: Normal range of motion. Neck supple.  Cardiovascular: Normal rate, regular rhythm and normal heart sounds.   Pulmonary/Chest: Effort normal and breath sounds normal.  Abdominal: Soft. Bowel sounds are normal. He exhibits no distension and no mass. There is no tenderness. There is no rebound and no guarding.  Musculoskeletal: Normal range of motion.  Right BKA  Neurological: He is alert.  Skin: Skin is warm and dry.  Erythema, warmth, and edema of the left perineum extending the left perirectal area and the left scrotum.  Area extremely tender to palpation.  No active drainage.    Psychiatric: He has a normal mood and affect.  Nursing note and vitals reviewed.   ED Course  Procedures (including critical care time) Labs Review Labs Reviewed  CBC WITH DIFFERENTIAL/PLATELET  - Abnormal; Notable for the following:    WBC 14.0 (*)    Neutro Abs 11.8 (*)    Monocytes Absolute 1.3 (*)    All other components within normal limits  COMPREHENSIVE METABOLIC PANEL - Abnormal; Notable for the following:    Sodium 129 (*)    Potassium 3.1 (*)    Chloride 89 (*)    Glucose, Bld 344 (*)    Albumin 3.0 (*)    Total Bilirubin 1.3 (*)    All other components within normal limits  I-STAT CREATININE, ED    Imaging Review Ct Pelvis W Contrast  12/02/2014   CLINICAL DATA:  Worsening perineal  pain and tenderness. Perineal cellulitis and abscess.  EXAM: CT PELVIS WITH CONTRAST  TECHNIQUE: Multidetector CT imaging of the pelvis was performed using the standard protocol following the bolus administration of intravenous contrast.  CONTRAST:  OMNIPAQUE IOHEXOL 300 MG/ML  SOLN  COMPARISON:  None.  FINDINGS: A complex rim enhancing fluid collection is seen in the left-sided subcutaneous tissues of the perineum which measures 4.3 x 8.3 cm. Surrounding inflammatory changes are seen. This is consistent with abscess. No soft tissue gas is seen. This may be arising from the inferior aspect of the anal sphincter.  No intraperitoneal inflammatory process or abnormal fluid collections identified. No pelvic soft tissue masses or lymphadenopathy identified. Sigmoid diverticulosis is noted, without evidence of diverticulitis.  IMPRESSION: 8.3 cm complex fluid collection within the left perineal soft tissues consistent with abscess. This may originate from a perianal fistula. If clinically warranted, pelvic MRI without and with contrast could be performed for further/preoperative evaluation.   Electronically Signed   By: Myles Rosenthal M.D.   On: 12/02/2014 15:27   I have personally reviewed and evaluated these images and lab results as part of my medical decision-making.   EKG Interpretation None     3:49 PM Patient discussed with General Surgery.  They state that they will consult on the  patient and recommend admission to Medicine   MDM   Final diagnoses:  None   Patient presents today with pain, erythema, and edema of the perineum extending to the left scrotum and perirectal area.  He has a history of Perineal Cyst that was repaired operatively by General Surgery approximately 2 years ago.  Labs today showing leukocytosis of 14.5 and also hyperglycemia.  Normal anion gap.  Patient given IVF and Insulin for the hyperglycemia.  CT scan of the pelvis today showing a 8.3 cm fluid collection of the left perineum consistent with an abscess.   General Surgery consulted and stated that they would consult on the patient but recommended admission to Medicine.  Internal Medicine Teaching service consulted and agreed to admit the patient.      Santiago Glad, PA-C 12/04/14 1048

## 2014-12-02 NOTE — Transfer of Care (Signed)
Immediate Anesthesia Transfer of Care Note  Patient: Brian Zavala  Procedure(s) Performed: Procedure(s): IRRIGATION AND DEBRIDEMENT PERINEAL ABSCESS (Left)  Patient Location: PACU  Anesthesia Type:General  Level of Consciousness: awake, alert , oriented and patient cooperative  Airway & Oxygen Therapy: Patient Spontanous Breathing and Patient connected to nasal cannula oxygen  Post-op Assessment: Report given to RN, Post -op Vital signs reviewed and stable and Patient moving all extremities X 4  Post vital signs: Reviewed and stable  Last Vitals:  Filed Vitals:   12/02/14 1835  BP: 124/63  Pulse: 107  Temp: 38.5 C  Resp: 18    Complications: No apparent anesthesia complications

## 2014-12-02 NOTE — ED Notes (Signed)
Patient noticed abcess to left buttock on Monday. States he saw his pcp Wednesday, was placed on doxycycline, had US done of area yesterday, results showed "Complex cystic subcutaneous lesion." patient has CT results with him. Patient denies fever or chills, states the pain became to much today so he decided to come in to be seen.

## 2014-12-02 NOTE — ED Notes (Signed)
Attempted report x1. 

## 2014-12-02 NOTE — Op Note (Signed)
IRRIGATION AND DEBRIDEMENT PERINEAL ABSCESS  Procedure Note  Brian Zavala 12/02/2014   Pre-op Diagnosis: left perineal abscess     Post-op Diagnosis: SAME  Procedure(s): INCISION, DRAINAGE, IRRIGATION OF LEFT PERINEAL ABSCESS  Surgeon(s): Abigail Miyamoto, MD  Anesthesia: General  Staff:  Circulator: Sandria Bales, RN Scrub Person: Delano Metz, RN  Estimated Blood Loss: Minimal               Specimens: CULTURES TAKEN          BLACKMAN,DOUGLAS A   Date: 12/02/2014  Time: 8:57 PM

## 2014-12-02 NOTE — ED Provider Notes (Signed)
Medical screening examination/treatment/procedure(s) were conducted as a shared visit with non-physician practitioner(s) and myself.  I personally evaluated the patient during the encounter.   EKG Interpretation None      Results for orders placed or performed during the hospital encounter of 12/02/14  CBC with Differential/Platelet  Result Value Ref Range   WBC 14.0 (H) 4.0 - 10.5 K/uL   RBC 4.98 4.22 - 5.81 MIL/uL   Hemoglobin 14.8 13.0 - 17.0 g/dL   HCT 40.9 81.1 - 91.4 %   MCV 83.9 78.0 - 100.0 fL   MCH 29.7 26.0 - 34.0 pg   MCHC 35.4 30.0 - 36.0 g/dL   RDW 78.2 95.6 - 21.3 %   Platelets 255 150 - 400 K/uL   Neutrophils Relative % 85 %   Neutro Abs 11.8 (H) 1.7 - 7.7 K/uL   Lymphocytes Relative 6 %   Lymphs Abs 0.8 0.7 - 4.0 K/uL   Monocytes Relative 9 %   Monocytes Absolute 1.3 (H) 0.1 - 1.0 K/uL   Eosinophils Relative 0 %   Eosinophils Absolute 0.0 0.0 - 0.7 K/uL   Basophils Relative 0 %   Basophils Absolute 0.0 0.0 - 0.1 K/uL  Comprehensive metabolic panel  Result Value Ref Range   Sodium 129 (L) 135 - 145 mmol/L   Potassium 3.1 (L) 3.5 - 5.1 mmol/L   Chloride 89 (L) 101 - 111 mmol/L   CO2 27 22 - 32 mmol/L   Glucose, Bld 344 (H) 65 - 99 mg/dL   BUN 10 6 - 20 mg/dL   Creatinine, Ser 0.86 0.61 - 1.24 mg/dL   Calcium 9.0 8.9 - 57.8 mg/dL   Total Protein 7.1 6.5 - 8.1 g/dL   Albumin 3.0 (L) 3.5 - 5.0 g/dL   AST 23 15 - 41 U/L   ALT 17 17 - 63 U/L   Alkaline Phosphatase 66 38 - 126 U/L   Total Bilirubin 1.3 (H) 0.3 - 1.2 mg/dL   GFR calc non Af Amer >60 >60 mL/min   GFR calc Af Amer >60 >60 mL/min   Anion gap 13 5 - 15  I-Stat Creatinine, ED (do not order at [facility])  Result Value Ref Range   Creatinine, Ser 0.70 0.61 - 1.24 mg/dL   [place] Extrem Low Left Ltd  12/01/2014   CLINICAL DATA:  Cystic lesion along the left inner thigh.  EXAM: ULTRASOUND left LOWER EXTREMITY LIMITED  TECHNIQUE: Ultrasound examination of the lower extremity soft tissues was performed in the  area of clinical concern.  COMPARISON:  None  FINDINGS: 8.4 by 2.2 by 4.7 cm subcutaneous complex lesion corresponding with the palpable abnormality in the left upper inner thigh. There is some enhance through transmission and the echogenic material inside this lesion appeared to be swirling at real-time sonography. No definite internal Doppler flow.  IMPRESSION: 1. Complex cystic subcutaneous lesion corresponding to the palpable abnormality along the inner thigh, measuring 8.4 by 2.2 by 4.7 cm.   Electronically Signed   By: Gaylyn Rong M.D.   On: 12/01/2014 15:19    Patient seen by me. Patient with soft tissue perineal scrotal erythema induration, concerning for abscess. CT scan to be done to evaluate whether this is a perineal abscess perirectal abscess or an abscess also involving the scrotum. Patient is a diabetic. Patient's blood sugars are elevated and would benefit from a little bit of control. Patient's had some tachycardia patient is mentating fine. The left perineal area has an area of induration measuring  about 4 x 7 cm with redness redness spreads up into the left scrotum with some swelling. Patient also is some erythema to the right scrotum.  Disposition will be based on CT findings. Most likely patient is at very least an admission.  Vanetta Mulders, MD 12/02/14 [phone]

## 2014-12-02 NOTE — Interval H&P Note (Signed)
History and Physical Interval Note: I have seen Mr. Chamberlin and agree with our PA and Dr. Lindie Spruce. I plan on proceeding to the operating room for irrigation and debridement of the left-sided perineal abscess. He has had this performed on the other side. I again discussed the risks with him in detail and he agrees to proceed with surgery which is scheduled urgently  12/02/2014 6:13 PM  [name]  has presented today for surgery, with the diagnosis of left perineal abscess  The various methods of treatment have been discussed with the patient and family. After consideration of risks, benefits and other options for treatment, the patient has consented to  Procedure(s): IRRIGATION AND DEBRIDEMENT PERINEAL ABSCESS (Left) as a surgical intervention .  The patient's history has been reviewed, patient examined, no change in status, stable for surgery.  I have reviewed the patient's chart and labs.  Questions were answered to the patient's satisfaction.     BLACKMAN,DOUGLAS A

## 2014-12-02 NOTE — ED Notes (Signed)
Obtained consent for procedure 

## 2014-12-02 NOTE — ED Notes (Signed)
Pt presents with abscess to L buttock x 5 days.  Pt denies any drainage to area.

## 2014-12-02 NOTE — H&P (Signed)
Date: 12/02/2014               Patient Name:  Brian Zavala MRN: [id number]  DOB: 1951/02/10 Age / Sex: 64 y.o., male   PCP: Jethro Bastos, MD         Medical Service: Internal Medicine Teaching Service         Attending Physician: Dr. Levert Feinstein, MD    First Contact: Dr. Karma Greaser Pager: [phone]  Second Contact: Dr. Senaida Ores Pager: [phone]       After Hours (After 5p/  First Contact Pager: [phone]  weekends / holidays): Second Contact Pager: [phone]   Chief Complaint: perineal abscess  History of Present Illness: Mr. 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 He reports that he noted pain, swelling, erythema and warmth in left groin/perinema area on Monday. Symptoms have gotten progressively worse and now complains of 10/10 pain. He was given a prescription for Doxycycline 2 days ago by his PCP but symptoms have continued to get worse. He has had a perineal abscess on the right in 2014 that was drained by Dr. Luisa Hart. Denies any fevers, chills, N/V/D, abdominal pain. No drainage. CT done in the ED shows an 8.3 cm left perineal abscess. Surgery consulted and will I&D. He reports he has not eaten since 8:30 am.   Meds: Current Facility-Administered Medications  Medication Dose Route Frequency Provider Last Rate Last Dose  . 0.9 % NaCl with KCl 20 mEq/ L  infusion   Intravenous Continuous Emina Riebock, NP 100 mL/hr at 12/02/14 1632    . [START ON 12/03/2014] aspirin tablet 81 mg  81 mg Oral Daily Alexa Dulcy Fanny, MD      . Melene Muller ON 12/03/2014] atorvastatin (LIPITOR) tablet 10 mg  10 mg Oral q1800 Alexa Dulcy Fanny, MD      . cefTRIAXone (ROCEPHIN) 2 g in dextrose 5 % 50 mL IVPB  2 g Intravenous Q24H Emina Riebock, NP   Stopped at 12/02/14 1703  . [START ON 12/03/2014] enoxaparin (LOVENOX) injection  40 mg  40 mg Subcutaneous Q24H Alexa Dulcy Fanny, MD      . Melene Muller ON 12/03/2014] hydrochlorothiazide (MICROZIDE) capsule 12.5 mg  12.5 mg Oral Daily Alexa Dulcy Fanny, MD      . HYDROcodone-acetaminophen (NORCO/VICODIN) 5-325 MG per tablet 1-2 tablet  1-2 tablet Oral Q4H PRN Nonie Hoyer, PA-C      . insulin aspart (novoLOG) injection 0-15 Units  0-15 Units Subcutaneous 6 times per day Alexa Dulcy Fanny, MD      . insulin glargine (LANTUS) injection 15 Units  15 Units Subcutaneous QPM Alexa Dulcy Fanny, MD      . Melene Muller ON 12/03/2014] leflunomide (ARAVA) tablet 10 mg  10 mg Oral Daily Alexa Dulcy Fanny, MD      . metroNIDAZOLE (FLAGYL) IVPB 500 mg  500 mg Intravenous Q6H Emina Riebock, NP 100 mL/hr at 12/02/14 1704 500 mg at 12/02/14 1704  . morphine 2 MG/ML injection 1-4 mg  1-4 mg Intravenous Q2H PRN Nonie Hoyer, PA-C   4 mg at 12/02/14 1625  . ondansetron (ZOFRAN) injection 4 mg  4 mg Intravenous Q8H PRN Alexa Dulcy Fanny, MD      . Melene Muller ON 12/03/2014] predniSONE (DELTASONE) tablet 2 mg  2 mg  Oral Q breakfast Alexa Dulcy Fanny, MD      . Melene Muller ON 12/03/2014] pregabalin (LYRICA) capsule 75 mg  75 mg Oral BID Alexa Dulcy Fanny, MD        Allergies: Allergies as of 12/02/2014  . (No Known Allergies)   Past Medical History  Diagnosis Date  . Diabetes mellitus without complication   . Hypertension   . Neuropathy   . S/P BKA (below knee amputation)     right  . Arthritis   . Hyperlipidemia   . Neuromuscular disorder   . Wears glasses   . Wears partial dentures     top and bottom partials  . COPD (chronic obstructive pulmonary disease)    Past Surgical History  Procedure Laterality Date  . Leg amputation below knee  2005    right-bka  . Back surgery  82,90    ruptured disc-lumbar  . Toe amputation  2005    rt foot  . Foot amputation  2005    right  . Scar revision  2005    rt bka  . Cyst excision perineal N/A 12/08/2012    Procedure: CYST EXCISION PERINeum;   Surgeon: Maisie Fus A. Cornett, MD;  Location: [facility];  Service: General;  Laterality: N/A;  . Cholecystectomy N/A 10/26/2013    Procedure: LAPAROSCOPIC CHOLECYSTECTOMY WITH INTRAOPERATIVE CHOLANGIOGRAM;  Surgeon: Clovis Pu. Cornett, MD;  Location: [facility];  Service: General;  Laterality: N/A;   History reviewed. No pertinent family history. Social History   Social History  . Marital Status: Divorced    Spouse Name: N/A  . Number of Children: N/A  . Years of Education: N/A   Occupational History  . Not on file.   Social History Main Topics  . Smoking status: Former Smoker -- 2.00 packs/day for 35 years    Types: Cigarettes    Quit date: 01/03/2012  . Smokeless tobacco: Never Used  . Alcohol Use: No  . Drug Use: No  . Sexual Activity: Not on file   Other Topics Concern  . Not on file   Social History Narrative   Review of Systems: Review of Systems  Constitutional: Negative for fever and chills.  Respiratory: Negative for shortness of breath.   Cardiovascular: Negative for chest pain.  Gastrointestinal: Negative for nausea, vomiting, abdominal pain, diarrhea and constipation.  Genitourinary: Negative for dysuria.  Skin: Positive for rash.  Neurological: Negative for headaches.   Physical Exam: Blood pressure 124/63, pulse 107, temperature 101.3 F (38.5 C), temperature source Oral, resp. rate 18, height 6\' 2"  (1.88 m), weight 188 lb (85.276 kg), SpO2 95 %.  GENERAL- alert, co-operative, appears as stated age, not in any distress. HEENT- Atraumatic, normocephalic, PERRL, EOMI, oral mucosa appears moist CARDIAC- RRR, no murmurs, rubs or gallops. RESP- Moving equal volumes of air, and clear to auscultation bilaterally, no wheezes or crackles. ABDOMEN- Soft, nontender, no guarding or rebound, no palpable masses or organomegaly, bowel sounds present. NEURO- No obvious Cr N abnormality, AAO x 3 EXTREMITIES- right BKA, no pedal edema SKIN- Large area of  fluctuance, warmth, and erythema in left perineum/groin area PSYCH- Normal mood and affect, appropriate thought content and speech.  Lab results: Basic Metabolic Panel:  Recent Labs  1310 12/02/14 1321  NA 129*  --   K 3.1*  --   CL 89*  --   CO2 27  --   GLUCOSE 344*  --   BUN 10  --   CREATININE 0.89 0.70  CALCIUM 9.0  --    Liver Function Tests:  Recent Labs  12/02/14 1310  AST 23  ALT 17  ALKPHOS 66  BILITOT 1.3*  PROT 7.1  ALBUMIN 3.0*   No results for input(s): LIPASE, AMYLASE in the last 72 hours. No results for input(s): AMMONIA in the last 72 hours. CBC:  Recent Labs  12/02/14 1310  WBC 14.0*  NEUTROABS 11.8*  HGB 14.8  HCT 41.8  MCV 83.9  PLT 255   Cardiac Enzymes: No results for input(s): CKTOTAL, CKMB, CKMBINDEX, TROPONINI in the last 72 hours. BNP: No results for input(s): PROBNP in the last 72 hours. D-Dimer: No results for input(s): DDIMER in the last 72 hours. CBG:  Recent Labs  12/02/14 1520  GLUCAP 299*   Hemoglobin A1C: No results for input(s): HGBA1C in the last 72 hours. Fasting Lipid Panel: No results for input(s): CHOL, HDL, LDLCALC, TRIG, CHOLHDL, LDLDIRECT in the last 72 hours. Thyroid Function Tests: No results for input(s): TSH, T4TOTAL, FREET4, T3FREE, THYROIDAB in the last 72 hours. Anemia Panel: No results for input(s): VITAMINB12, FOLATE, FERRITIN, TIBC, IRON, RETICCTPCT in the last 72 hours. Coagulation: No results for input(s): LABPROT, INR in the last 72 hours. Urine Drug Screen: Drugs of Abuse  No results found for: LABOPIA, COCAINSCRNUR, LABBENZ, AMPHETMU, THCU, LABBARB  Alcohol Level: No results for input(s): ETH in the last 72 hours. Urinalysis: No results for input(s): COLORURINE, LABSPEC, PHURINE, GLUCOSEU, HGBUR, BILIRUBINUR, KETONESUR, PROTEINUR, UROBILINOGEN, NITRITE, LEUKOCYTESUR in the last 72 hours.  Invalid input(s): APPERANCEUR  Imaging results:  Ct Pelvis W Contrast  12/02/2014    CLINICAL DATA:  Worsening perineal pain and tenderness. Perineal cellulitis and abscess.  EXAM: CT PELVIS WITH CONTRAST  TECHNIQUE: Multidetector CT imaging of the pelvis was performed using the standard protocol following the bolus administration of intravenous contrast.  CONTRAST:  OMNIPAQUE IOHEXOL 300 MG/ML  SOLN  COMPARISON:  None.  FINDINGS: A complex rim enhancing fluid collection is seen in the left-sided subcutaneous tissues of the perineum which measures 4.3 x 8.3 cm. Surrounding inflammatory changes are seen. This is consistent with abscess. No soft tissue gas is seen. This may be arising from the inferior aspect of the anal sphincter.  No intraperitoneal inflammatory process or abnormal fluid collections identified. No pelvic soft tissue masses or lymphadenopathy identified. Sigmoid diverticulosis is noted, without evidence of diverticulitis.  IMPRESSION: 8.3 cm complex fluid collection within the left perineal soft tissues consistent with abscess. This may originate from a perianal fistula. If clinically warranted, pelvic MRI without and with contrast could be performed for further/preoperative evaluation.   Electronically Signed   By: Myles Rosenthal M.D.   On: 12/02/2014 15:27   [place] Extrem Low Left Ltd  12/01/2014   CLINICAL DATA:  Cystic lesion along the left inner thigh.  EXAM: ULTRASOUND left LOWER EXTREMITY LIMITED  TECHNIQUE: Ultrasound examination of the lower extremity soft tissues was performed in the area of clinical concern.  COMPARISON:  None  FINDINGS: 8.4 by 2.2 by 4.7 cm subcutaneous complex lesion corresponding with the palpable abnormality in the left upper inner thigh. There is some enhance through transmission and the echogenic material inside this lesion appeared to be swirling at real-time sonography. No definite internal Doppler flow.  IMPRESSION: 1. Complex cystic subcutaneous lesion corresponding to the palpable abnormality along the inner thigh, measuring 8.4 by 2.2 by 4.7  cm.   Electronically Signed   By: Gaylyn Rong M.D.   On: 12/01/2014 15:19  Assessment & Plan by Problem: Active Problems:   Perineal abscess   Abscess  Perineal Abscess: CT with 8.3 cm left perineal abscess. WBC 14, febrile to 101.3, tachycardic. BP stable. Surgery has seen and evaluated the patient. Will go for I&D tonight. Lactate 2.8.  - NPO - blood cultures - Ceftriaxone and Metronidazole - NS with KCl 20 mEq 100 mL/hr - Zofran prn - vicodin prn - Morphine prn - CBC and BMET in am  DM type II with neuropathy: Takes victoza 1.8 mg qhs and lantus 15 untis qhs at home. CBG 299 today.  - Lantus 15 untis qhs - SSI moderate  - Lycrica   Hypokalemia:   RA: On etanercept 50 mg weekly and leflunomide 10 mg daily and prednisone 2 mg daily.  - continue leflunomide and prednisone  HTN: BP stable here.   HLD: continue atorvastatin 10 mg  DVT PPx: lovenox  Dispo: Disposition is deferred at this time, awaiting improvement of current medical problems. Anticipated discharge in approximately 2-4 day(s).   The patient does have a current PCP Jethro Bastos, MD) and does need an [facility] hospital follow-up appointment after discharge.  The patient does not have transportation limitations that hinder transportation to clinic appointments.  Signed: Valentino Nose, MD 12/02/2014, 7:54 PM

## 2014-12-02 NOTE — Anesthesia Preprocedure Evaluation (Addendum)
Anesthesia Evaluation  Patient identified by MRN, date of birth, ID band Patient awake    Reviewed: Allergy & Precautions, NPO status , Patient's Chart, lab work & pertinent test results  History of Anesthesia Complications Negative for: history of anesthetic complications  Airway Mallampati: I  TM Distance: >3 FB     Dental  (+) Partial Upper, Partial Lower, Dental Advisory Given   Pulmonary COPD, former smoker,    Pulmonary exam normal        Cardiovascular hypertension, Normal cardiovascular exam     Neuro/Psych negative neurological ROS     GI/Hepatic   Endo/Other  diabetes  Renal/GU      Musculoskeletal  (+) Arthritis ,   Abdominal   Peds  Hematology   Anesthesia Other Findings   Reproductive/Obstetrics                            Anesthesia Physical Anesthesia Plan  ASA: III and emergent  Anesthesia Plan: General   Post-op Pain Management:    Induction: Intravenous, Rapid sequence and Cricoid pressure planned  Airway Management Planned: Oral ETT  Additional Equipment:   Intra-op Plan:   Post-operative Plan: Extubation in OR  Informed Consent: I have reviewed the patients History and Physical, chart, labs and discussed the procedure including the risks, benefits and alternatives for the proposed anesthesia with the patient or authorized representative who has indicated his/her understanding and acceptance.   Dental advisory given  Plan Discussed with: CRNA, Anesthesiologist and Surgeon  Anesthesia Plan Comments:        Anesthesia Quick Evaluation                                  Anesthesia Evaluation  Patient identified by MRN, date of birth, ID band Patient awake    Reviewed: Allergy & Precautions, H&P , NPO status , Patient's Chart, lab work & pertinent test results  Airway Mallampati: II TM Distance: >3 FB Neck ROM: Full    Dental  (+) Poor  Dentition, Dental Advisory Given   Pulmonary former smoker,          Cardiovascular hypertension, Pt. on medications     Neuro/Psych  Neuromuscular disease    GI/Hepatic   Endo/Other  diabetes, Type 2, Insulin Dependent  Renal/GU      Musculoskeletal   Abdominal   Peds  Hematology   Anesthesia Other Findings   Reproductive/Obstetrics                          Anesthesia Physical Anesthesia Plan  ASA: III  Anesthesia Plan: General   Post-op Pain Management:    Induction: Intravenous  Airway Management Planned: Oral ETT  Additional Equipment:   Intra-op Plan:   Post-operative Plan: Extubation in OR  Informed Consent: I have reviewed the patients History and Physical, chart, labs and discussed the procedure including the risks, benefits and alternatives for the proposed anesthesia with the patient or authorized representative who has indicated his/her understanding and acceptance.   Dental advisory given  Plan Discussed with: CRNA, Anesthesiologist and Surgeon  Anesthesia Plan Comments:        Anesthesia Quick Evaluation

## 2014-12-02 NOTE — H&P (View-Only) (Signed)
[name] Surgery Consult Note  Brian Zavala 07-Jan-1951  [id number].    Requesting MD: Dr. Rogene Houston Chief Complaint/Reason for Consult: Perianal abscess  HPI:  64 y/o white male with PMH DM, HTN, RA presents with a "cyst" on his bottom/groin with pain and swelling which started on Monday.  Notes pain, swelling, redness now on left perineum/groin.  His symptoms have progressively worsened over the week (now 10/10 pain) which brought him to [facility].  He has had this before.  He had one on the right in 2014 drained by Dr. Brantley Stage.  No fever/chills, N/V/D, abdominal pain, or CP/SOB.  Denies any drainage.  No pus/blood per rectum.  Takes Enbrel for RA, last dose last wed.  No h/o IBD.  Tried advil without relief.  Last meal 8am.  No other precipitating/alleviating factors.  Diabetes reportedly has been well controlled.  Get[facility] seen at [facility].  Lap chole 2015, right BKA.  CT shows 8.3cm left perineal abscess.  WBC 14K, CBG 344.  ROS: All systems reviewed and otherwise negative except for as above  History reviewed. No pertinent family history.  Past Medical History  Diagnosis Date  . Diabetes mellitus without complication   . Hypertension   . Neuropathy   . S/P BKA (below knee amputation)     right  . Arthritis   . Hyperlipidemia   . Neuromuscular disorder   . Wears glasses   . Wears partial dentures     top and bottom partials    Past Surgical History  Procedure Laterality Date  . Leg amputation below knee  2005    right-bka  . Back surgery  82,90    ruptured disc-lumbar  . Toe amputation  2005    rt foot  . Foot amputation  2005    right  . Scar revision  2005    rt bka  . Cyst excision perineal N/A 12/08/2012    Procedure: CYST EXCISION PERINeum;  Surgeon: Marcello Moores A. Cornett, MD;  Location: [place];  Service: General;  Laterality: N/A;  . Cholecystectomy N/A 10/26/2013    Procedure: LAPAROSCOPIC CHOLECYSTECTOMY WITH  INTRAOPERATIVE CHOLANGIOGRAM;  Surgeon: Joyice Faster. Cornett, MD;  Location: [place];  Service: General;  Laterality: N/A;    Social History:  reports that he quit smoking about 2 years ago. His smoking use included Cigarettes. He has a 70 pack-year smoking history. He has never used smokeless tobacco. He reports that he does not drink alcohol or use illicit drugs.  Allergies: No Known Allergies   (Not in a hospital admission)  Blood pressure 119/64, pulse 99, temperature 97.8 F (36.6 C), temperature source Oral, resp. rate 16, height _0  (1.88 m), weight 85.276 kg (188 lb), SpO2 95 %. Physical Exam: General: pleasant, WD/WN white male who is laying in bed in NAD HEENT: head is normocephalic, atraumatic.  Sclera are noninjected.  Ears and nose without any masses or lesions.  Mouth is pink and moist Heart: regular, rate, and rhythm.  No obvious murmurs, gallops, or rubs noted.  Palpable pedal pulses bilaterally Lungs: CTAB, no wheezes, rhonchi, or rales noted.  Respiratory effort nonlabored Abd: soft, NT/ND, +BS, no masses, hernias, or organomegaly Left perineum/groin:  Large area of fluctuance which extends to scrotum and buttock, induration and erythema present, no drainage.  MS: Right BKA, all extremities with good CSM distally Psych: A&Ox3 with an appropriate affect.   Results for orders placed or performed during the hospital encounter of  12/02/14 (from the past 48 hour(s))  CBC with Differential/Platelet     Status: Abnormal   Collection Time: 12/02/14  1:10 PM  Result Value Ref Range   WBC 14.0 (H) 4.0 - 10.5 K/uL   RBC 4.98 4.22 - 5.81 MIL/uL   Hemoglobin 14.8 13.0 - 17.0 g/dL   HCT 41.8 39.0 - 52.0 %   MCV 83.9 78.0 - 100.0 fL   MCH 29.7 26.0 - 34.0 pg   MCHC 35.4 30.0 - 36.0 g/dL   RDW 13.5 11.5 - 15.5 %   Platelets 255 150 - 400 K/uL   Neutrophils Relative % 85 %   Neutro Abs 11.8 (H) 1.7 - 7.7 K/uL   Lymphocytes Relative 6 %   Lymphs Abs 0.8 0.7 - 4.0 K/uL   Monocytes  Relative 9 %   Monocytes Absolute 1.3 (H) 0.1 - 1.0 K/uL   Eosinophils Relative 0 %   Eosinophils Absolute 0.0 0.0 - 0.7 K/uL   Basophils Relative 0 %   Basophils Absolute 0.0 0.0 - 0.1 K/uL  Comprehensive metabolic panel     Status: Abnormal   Collection Time: 12/02/14  1:10 PM  Result Value Ref Range   Sodium 129 (L) 135 - 145 mmol/L   Potassium 3.1 (L) 3.5 - 5.1 mmol/L   Chloride 89 (L) 101 - 111 mmol/L   CO2 27 22 - 32 mmol/L   Glucose, Bld 344 (H) 65 - 99 mg/dL   BUN 10 6 - 20 mg/dL   Creatinine, Ser 0.89 0.61 - 1.24 mg/dL   Calcium 9.0 8.9 - 10.3 mg/dL   Total Protein 7.1 6.5 - 8.1 g/dL   Albumin 3.0 (L) 3.5 - 5.0 g/dL   AST 23 15 - 41 U/L   ALT 17 17 - 63 U/L   Alkaline Phosphatase 66 38 - 126 U/L   Total Bilirubin 1.3 (H) 0.3 - 1.2 mg/dL   GFR calc non Af Amer >60 >60 mL/min   GFR calc Af Amer >60 >60 mL/min    Comment: (NOTE) The eGFR has been calculated using the CKD EPI equation. This calculation has not been validated in all clinical situations. eGFR's persistently <60 mL/min signify possible Chronic Kidney Disease.    Anion gap 13 5 - 15  I-Stat Creatinine, ED (do not order at [facility])     Status: None   Collection Time: 12/02/14  1:21 PM  Result Value Ref Range   Creatinine, Ser 0.70 0.61 - 1.24 mg/dL  I-Stat CG4 Lactic Acid, ED     Status: Abnormal   Collection Time: 12/02/14  2:46 PM  Result Value Ref Range   Lactic Acid, Venous 2.80 (HH) 0.5 - 2.0 mmol/L   Comment NOTIFIED PHYSICIAN   POC CBG, ED     Status: Abnormal   Collection Time: 12/02/14  3:20 PM  Result Value Ref Range   Glucose-Capillary 299 (H) 65 - 99 mg/dL   Ct Pelvis W Contrast  12/02/2014   CLINICAL DATA:  Worsening perineal pain and tenderness. Perineal cellulitis and abscess.  EXAM: CT PELVIS WITH CONTRAST  TECHNIQUE: Multidetector CT imaging of the pelvis was performed using the standard protocol following the bolus administration of intravenous contrast.  CONTRAST:  17m OMNIPAQUE IOHEXOL  300 MG/ML  SOLN  COMPARISON:  None.  FINDINGS: A complex rim enhancing fluid collection is seen in the left-sided subcutaneous tissues of the perineum which measures 4.3 x 8.3 cm. Surrounding inflammatory changes are seen. This is consistent with abscess. No soft  tissue gas is seen. This may be arising from the inferior aspect of the anal sphincter.  No intraperitoneal inflammatory process or abnormal fluid collections identified. No pelvic soft tissue masses or lymphadenopathy identified. Sigmoid diverticulosis is noted, without evidence of diverticulitis.  IMPRESSION: 8.3 cm complex fluid collection within the left perineal soft tissues consistent with abscess. This may originate from a perianal fistula. If clinically warranted, pelvic MRI without and with contrast could be performed for further/preoperative evaluation.   Electronically Signed   By: Earle Gell M.D.   On: 12/02/2014 15:27   [place] Extrem Low Left Ltd  12/01/2014   CLINICAL DATA:  Cystic lesion along the left inner thigh.  EXAM: ULTRASOUND left LOWER EXTREMITY LIMITED  TECHNIQUE: Ultrasound examination of the lower extremity soft tissues was performed in the area of clinical concern.  COMPARISON:  None  FINDINGS: 8.4 by 2.2 by 4.7 cm subcutaneous complex lesion corresponding with the palpable abnormality in the left upper inner thigh. There is some enhance through transmission and the echogenic material inside this lesion appeared to be swirling at real-time sonography. No definite internal Doppler flow.  IMPRESSION: 1. Complex cystic subcutaneous lesion corresponding to the palpable abnormality along the inner thigh, measuring 8.4 by 2.2 by 4.7 cm.   Electronically Signed   By: Van Clines M.D.   On: 12/01/2014 15:19      Assessment/Plan 8.3cm left perineal abscess -Admit to Medicine, we will consult.  Will need I&D in OR.  Discussed risks/benefits of procedure.  He would like to proceed.   -NPO, IVF, pain control, antiemetics,  antibiotics (rocephin/flagyl) -Dr. Ninfa Linden or Dr. Hulen Skains will see and evaluate -Supplemented potassium in IVF, will need blood sugars managed per medicine  Uncontrolled diabetes type 2 with neuropathy - 344 Leukocytosis Hypokalemia - supplemented  RA - on Enbrel H/o Lap chole, right BKA HTN/HLD  Nat Christen, [facility] Surgery 12/02/2014, 3:54 PM Pager: [phone]

## 2014-12-02 NOTE — ED Notes (Signed)
Patient placed on cardiac monitor due to elevated heart rate.

## 2014-12-02 NOTE — Anesthesia Procedure Notes (Signed)
Procedure Name: Intubation Date/Time: 12/02/2014 8:37 PM Performed by: Melina Schools Pre-anesthesia Checklist: Patient identified, Timeout performed, Emergency Drugs available, Suction available and Patient being monitored Patient Re-evaluated:Patient Re-evaluated prior to inductionOxygen Delivery Method: Circle system utilized and Simple face mask Preoxygenation: Pre-oxygenation with 100% oxygen Intubation Type: IV induction, Rapid sequence and Cricoid Pressure applied Ventilation: Mask ventilation without difficulty Laryngoscope Size: Mac and 3 Grade View: Grade I Tube type: Oral Tube size: 7.5 mm Number of attempts: 1 Airway Equipment and Method: Stylet Placement Confirmation: ETT inserted through vocal cords under direct vision,  positive ETCO2 and breath sounds checked- equal and bilateral Secured at: 23 cm Tube secured with: Tape Dental Injury: Teeth and Oropharynx as per pre-operative assessment

## 2014-12-02 NOTE — Anesthesia Postprocedure Evaluation (Signed)
Anesthesia Post Note  Patient: Brian Zavala  Procedure(s) Performed: Procedure(s) (LRB): IRRIGATION AND DEBRIDEMENT PERINEAL ABSCESS (Left)  Anesthesia type: general  Patient location: PACU  Post pain: Pain level controlled  Post assessment: Patient's Cardiovascular Status Stable  Last Vitals:  Filed Vitals:   12/02/14 2145  BP: 112/60  Pulse: 99  Temp: 36 C  Resp: 18    Post vital signs: Reviewed and stable  Level of consciousness: sedated  Complications: No apparent anesthesia complications

## 2014-12-03 DIAGNOSIS — M069 Rheumatoid arthritis, unspecified: Secondary | ICD-10-CM | POA: Insufficient documentation

## 2014-12-03 DIAGNOSIS — IMO0001 Reserved for inherently not codable concepts without codable children: Secondary | ICD-10-CM | POA: Insufficient documentation

## 2014-12-03 DIAGNOSIS — E1140 Type 2 diabetes mellitus with diabetic neuropathy, unspecified: Secondary | ICD-10-CM | POA: Insufficient documentation

## 2014-12-03 DIAGNOSIS — Z89519 Acquired absence of unspecified leg below knee: Secondary | ICD-10-CM | POA: Insufficient documentation

## 2014-12-03 DIAGNOSIS — E119 Type 2 diabetes mellitus without complications: Secondary | ICD-10-CM

## 2014-12-03 DIAGNOSIS — L02215 Cutaneous abscess of perineum: Principal | ICD-10-CM

## 2014-12-03 DIAGNOSIS — Z794 Long term (current) use of insulin: Secondary | ICD-10-CM | POA: Insufficient documentation

## 2014-12-03 DIAGNOSIS — Z89511 Acquired absence of right leg below knee: Secondary | ICD-10-CM

## 2014-12-03 LAB — BASIC METABOLIC PANEL
Anion gap: 11 (ref 5–15)
BUN: 8 mg/dL (ref 6–20)
CO2: 27 mmol/L (ref 22–32)
Calcium: 8.4 mg/dL — ABNORMAL LOW (ref 8.9–10.3)
Chloride: 95 mmol/L — ABNORMAL LOW (ref 101–111)
Creatinine, Ser: 0.7 mg/dL (ref 0.61–1.24)
GFR calc Af Amer: >60 mL/min (ref >60)
GFR calc non Af Amer: >60 mL/min (ref >60)
Glucose, Bld: 208 mg/dL — ABNORMAL HIGH (ref 65–99)
Potassium: 3.4 mmol/L — ABNORMAL LOW (ref 3.5–5.1)
Sodium: 133 mmol/L — ABNORMAL LOW (ref 135–145)

## 2014-12-03 LAB — CBC
HCT: 41 % (ref 39.0–52.0)
Hemoglobin: 14.2 g/dL (ref 13.0–17.0)
MCH: 29.4 pg (ref 26.0–34.0)
MCHC: 34.6 g/dL (ref 30.0–36.0)
MCV: 84.9 fL (ref 78.0–100.0)
Platelets: 267 10*3/uL (ref 150–400)
RBC: 4.83 MIL/uL (ref 4.22–5.81)
RDW: 13.5 % (ref 11.5–15.5)
WBC: 17.1 10*3/uL — ABNORMAL HIGH (ref 4.0–10.5)

## 2014-12-03 LAB — GLUCOSE, CAPILLARY
Glucose-Capillary: 206 mg/dL — ABNORMAL HIGH (ref 65–99)
Glucose-Capillary: 254 mg/dL — ABNORMAL HIGH (ref 65–99)
Glucose-Capillary: 272 mg/dL — ABNORMAL HIGH (ref 65–99)
Glucose-Capillary: 318 mg/dL — ABNORMAL HIGH (ref 65–99)
Glucose-Capillary: 272 mg/dL — ABNORMAL HIGH (ref 65–99)
Glucose-Capillary: 265 mg/dL — ABNORMAL HIGH (ref 65–99)

## 2014-12-03 LAB — MAGNESIUM: Magnesium: 1.8 mg/dL (ref 1.7–2.4)

## 2014-12-03 MED ORDER — INSULIN ASPART 100 UNIT/ML ~~LOC~~ SOLN
0.0000 [IU] | Freq: Every day | SUBCUTANEOUS | Status: DC
  Administered 2014-12-03: 3 [IU] via SUBCUTANEOUS

## 2014-12-03 MED ORDER — MORPHINE SULFATE 15 MG PO TABS
30.0000 mg | ORAL_TABLET | ORAL | Status: DC | PRN
  Administered 2014-12-03 – 2014-12-04 (×6): 30 mg via ORAL
  Filled 2014-12-03 (×6): qty 2

## 2014-12-03 MED ORDER — NICOTINE 14 MG/24HR TD PT24
14.0000 mg | MEDICATED_PATCH | Freq: Every day | TRANSDERMAL | Status: DC
  Administered 2014-12-03 – 2014-12-04 (×3): 14 mg via TRANSDERMAL
  Filled 2014-12-03 (×3): qty 1

## 2014-12-03 MED ORDER — INSULIN GLARGINE 100 UNIT/ML ~~LOC~~ SOLN
40.0000 [IU] | Freq: Every evening | SUBCUTANEOUS | Status: DC
  Administered 2014-12-03: 40 [IU] via SUBCUTANEOUS
  Filled 2014-12-03 (×3): qty 0.4

## 2014-12-03 MED ORDER — POTASSIUM CHLORIDE CRYS ER 20 MEQ PO TBCR
40.0000 meq | EXTENDED_RELEASE_TABLET | Freq: Once | ORAL | Status: AC
  Administered 2014-12-03: 40 meq via ORAL
  Filled 2014-12-03: qty 2

## 2014-12-03 MED ORDER — INSULIN GLARGINE 100 UNIT/ML ~~LOC~~ SOLN
40.0000 [IU] | Freq: Every evening | SUBCUTANEOUS | Status: DC
  Filled 2014-12-03: qty 0.4

## 2014-12-03 MED ORDER — INSULIN ASPART 100 UNIT/ML ~~LOC~~ SOLN
0.0000 [IU] | Freq: Three times a day (TID) | SUBCUTANEOUS | Status: DC
  Administered 2014-12-03 – 2014-12-04 (×3): 8 [IU] via SUBCUTANEOUS
  Administered 2014-12-04: 3 [IU] via SUBCUTANEOUS

## 2014-12-03 MED ORDER — INSULIN GLARGINE 100 UNIT/ML ~~LOC~~ SOLN
50.0000 [IU] | Freq: Every evening | SUBCUTANEOUS | Status: DC
  Filled 2014-12-03: qty 0.5

## 2014-12-03 NOTE — Progress Notes (Signed)
1 Day Post-Op  Subjective: States he takes morphine at home because of his diabetic neuropathy.  He is requesting an increase in his dosage. Has pain and perineum.  No bleeding. Temp up to 1013 asked night but now 98 4 with heart rate 86. WBC still 17,000.  The hemoglobin 14.2. On IV Flagyl and IV Rocephin.  Gram stain pending.  Objective: Vital signs in last 24 hours: Temp:  [96.8 F (36 C)-101.3 F (38.5 C)] 98.4 F (36.9 C) (10/01 0439) Pulse Rate:  [83-141] 86 (10/01 0439) Resp:  [14-24] 18 (10/01 0439) BP: (95-154)/(47-118) 154/80 mmHg (10/01 0439) SpO2:  [90 %-100 %] 99 % (10/01 0439) Weight:  [84.2 kg (185 lb 10 oz)-85.276 kg (188 lb)] 84.2 kg (185 lb 10 oz) (09/30 2209)    Intake/Output from previous day: 09/30 0701 - 10/01 0700 In: 3896.7 [P.O.:600; I.V.:3296.7] Out: 525 [Urine:525] Intake/Output this shift:    General appearance: Alert.  Cooperative.  Doesn't appear toxic. GI: soft, non-tender; bowel sounds normal; no masses,  no organomegaly Male genitalia: normal, Perineal wound examined.  Packing removed.  Somewhat tender as expected but no bleeding.  No necrosis.  No odor.  No evidence of fasciitis.  Appears well debrided and drained.  Repacked and taught nursing staff how to do this.  Lab Results:  Results for orders placed or performed during the hospital encounter of 12/02/14 (from the past 24 hour(s))  CBC with Differential/Platelet     Status: Abnormal   Collection Time: 12/02/14  1:10 PM  Result Value Ref Range   WBC 14.0 (H) 4.0 - 10.5 K/uL   RBC 4.98 4.22 - 5.81 MIL/uL   Hemoglobin 14.8 13.0 - 17.0 g/dL   HCT 46.2 70.3 - 50.0 %   MCV 83.9 78.0 - 100.0 fL   MCH 29.7 26.0 - 34.0 pg   MCHC 35.4 30.0 - 36.0 g/dL   RDW 93.8 18.2 - 99.3 %   Platelets 255 150 - 400 K/uL   Neutrophils Relative % 85 %   Neutro Abs 11.8 (H) 1.7 - 7.7 K/uL   Lymphocytes Relative 6 %   Lymphs Abs 0.8 0.7 - 4.0 K/uL   Monocytes Relative 9 %   Monocytes Absolute 1.3 (H) 0.1  - 1.0 K/uL   Eosinophils Relative 0 %   Eosinophils Absolute 0.0 0.0 - 0.7 K/uL   Basophils Relative 0 %   Basophils Absolute 0.0 0.0 - 0.1 K/uL  Comprehensive metabolic panel     Status: Abnormal   Collection Time: 12/02/14  1:10 PM  Result Value Ref Range   Sodium 129 (L) 135 - 145 mmol/L   Potassium 3.1 (L) 3.5 - 5.1 mmol/L   Chloride 89 (L) 101 - 111 mmol/L   CO2 27 22 - 32 mmol/L   Glucose, Bld 344 (H) 65 - 99 mg/dL   BUN 10 6 - 20 mg/dL   Creatinine, Ser 7.16 0.61 - 1.24 mg/dL   Calcium 9.0 8.9 - 96.7 mg/dL   Total Protein 7.1 6.5 - 8.1 g/dL   Albumin 3.0 (L) 3.5 - 5.0 g/dL   AST 23 15 - 41 U/L   ALT 17 17 - 63 U/L   Alkaline Phosphatase 66 38 - 126 U/L   Total Bilirubin 1.3 (H) 0.3 - 1.2 mg/dL   GFR calc non Af Amer >60 >60 mL/min   GFR calc Af Amer >60 >60 mL/min   Anion gap 13 5 - 15  I-Stat Creatinine, ED (do not order at [facility])  Status: None   Collection Time: 12/02/14  1:21 PM  Result Value Ref Range   Creatinine, Ser 0.70 0.61 - 1.24 mg/dL  I-Stat CG4 Lactic Acid, ED     Status: Abnormal   Collection Time: 12/02/14  2:46 PM  Result Value Ref Range   Lactic Acid, Venous 2.80 (HH) 0.5 - 2.0 mmol/L   Comment NOTIFIED PHYSICIAN   POC CBG, ED     Status: Abnormal   Collection Time: 12/02/14  3:20 PM  Result Value Ref Range   Glucose-Capillary 299 (H) 65 - 99 mg/dL  Glucose, capillary     Status: Abnormal   Collection Time: 12/02/14 11:50 PM  Result Value Ref Range   Glucose-Capillary 266 (H) 65 - 99 mg/dL  Glucose, capillary     Status: Abnormal   Collection Time: 12/03/14  4:42 AM  Result Value Ref Range   Glucose-Capillary 206 (H) 65 - 99 mg/dL  Magnesium     Status: None   Collection Time: 12/03/14  5:20 AM  Result Value Ref Range   Magnesium 1.8 1.7 - 2.4 mg/dL  Basic metabolic panel     Status: Abnormal   Collection Time: 12/03/14  5:20 AM  Result Value Ref Range   Sodium 133 (L) 135 - 145 mmol/L   Potassium 3.4 (L) 3.5 - 5.1 mmol/L   Chloride 95  (L) 101 - 111 mmol/L   CO2 27 22 - 32 mmol/L   Glucose, Bld 208 (H) 65 - 99 mg/dL   BUN 8 6 - 20 mg/dL   Creatinine, Ser 6.20 0.61 - 1.24 mg/dL   Calcium 8.4 (L) 8.9 - 10.3 mg/dL   GFR calc non Af Amer >60 >60 mL/min   GFR calc Af Amer >60 >60 mL/min   Anion gap 11 5 - 15  CBC     Status: Abnormal   Collection Time: 12/03/14  5:20 AM  Result Value Ref Range   WBC 17.1 (H) 4.0 - 10.5 K/uL   RBC 4.83 4.22 - 5.81 MIL/uL   Hemoglobin 14.2 13.0 - 17.0 g/dL   HCT 35.5 97.4 - 16.3 %   MCV 84.9 78.0 - 100.0 fL   MCH 29.4 26.0 - 34.0 pg   MCHC 34.6 30.0 - 36.0 g/dL   RDW 84.5 36.4 - 68.0 %   Platelets 267 150 - 400 K/uL  Glucose, capillary     Status: Abnormal   Collection Time: 12/03/14  7:51 AM  Result Value Ref Range   Glucose-Capillary 254 (H) 65 - 99 mg/dL   Comment 1 Notify RN      Studies/Results: Ct Pelvis W Contrast  12/02/2014   CLINICAL DATA:  Worsening perineal pain and tenderness. Perineal cellulitis and abscess.  EXAM: CT PELVIS WITH CONTRAST  TECHNIQUE: Multidetector CT imaging of the pelvis was performed using the standard protocol following the bolus administration of intravenous contrast.  CONTRAST:  OMNIPAQUE IOHEXOL 300 MG/ML  SOLN  COMPARISON:  None.  FINDINGS: A complex rim enhancing fluid collection is seen in the left-sided subcutaneous tissues of the perineum which measures 4.3 x 8.3 cm. Surrounding inflammatory changes are seen. This is consistent with abscess. No soft tissue gas is seen. This may be arising from the inferior aspect of the anal sphincter.  No intraperitoneal inflammatory process or abnormal fluid collections identified. No pelvic soft tissue masses or lymphadenopathy identified. Sigmoid diverticulosis is noted, without evidence of diverticulitis.  IMPRESSION: 8.3 cm complex fluid collection within the left perineal soft tissues consistent  with abscess. This may originate from a perianal fistula. If clinically warranted, pelvic MRI without and  with contrast could be performed for further/preoperative evaluation.   Electronically Signed   By: Myles Rosenthal M.D.   On: 12/02/2014 15:27    . aspirin  81 mg Oral Daily  . atorvastatin  10 mg Oral q1800  . cefTRIAXone (ROCEPHIN)  IV  2 g Intravenous Q24H  . enoxaparin (LOVENOX) injection  40 mg Subcutaneous Q24H  . hydrochlorothiazide  12.5 mg Oral Daily  . insulin aspart  0-15 Units Subcutaneous TID WC  . insulin aspart  0-5 Units Subcutaneous QHS  . insulin glargine  40 Units Subcutaneous QPM  . leflunomide  10 mg Oral Daily  . metronidazole  500 mg Intravenous Q6H  . nicotine  14 mg Transdermal Daily  . predniSONE  2 mg Oral Q breakfast  . pregabalin  75 mg Oral BID     Assessment/Plan: s/p Procedure(s): IRRIGATION AND DEBRIDEMENT PERINEAL ABSCESS  POD #1.  Drainage and debridement of perineal abscess. Begin twice a day dressing changes Continue IV Flagyl and Rocephin Check cultures Mobilize  Diabetes.  Currently on SSI, Lantus, NovoLog.  CBGs ranging from 206 04/05/1964.  Will increase lantus dosage. Target glucose 120-180.  Diabetic neuropathy.  Chronic morphine therapy at home.  Will add MSIR to supplement IV morphine.  We will discontinue all other narcotics.  @PROBHOSP @  LOS: 1 day    INGRAM,HAYWOOD M 12/03/2014  . .prob

## 2014-12-03 NOTE — Op Note (Signed)
NAMEGREG, CRATTY NO.:  [id number]  MEDICAL RECORD NO.:  [id number]  LOCATION:  6N20C                        FACILITY:  [facility]  PHYSICIAN:  Abigail Miyamoto, M.D. DATE OF BIRTH:  1950-07-17  DATE OF PROCEDURE:  12/02/2014 DATE OF DISCHARGE:                              OPERATIVE REPORT   PREOPERATIVE DIAGNOSIS:  Left perineal abscess.  POSTOPERATIVE DIAGNOSIS:  Left perineal abscess.  PROCEDURE:  Incision, drainage, and irrigation of left perineal abscess.  SURGEON:  Abigail Miyamoto, M.D.  ANESTHESIA:  General and 0.5% Marcaine with epinephrine.  ESTIMATED BLOOD LOSS:  Minimal.  FINDINGS:  The patient was found to have a large left-sided perineal abscess.  It tracked just underneath the scrotum.  There was no underlying necrotic subcutaneous tissue or muscle.  It did not appear to track intra-anally either.  DESCRIPTION OF PROCEDURE:  The patient was brought to the operating room, identified as Wayland Denis.  He was placed supine on the operating room table and general anesthesia was induced.  The patient was then placed in lithotomy position.  His perineum and scrotum were then prepped and draped in usual sterile fashion.  He already had a small opening in the left side of the perineum which was draining purulence.  I opened this up with a scalpel and obtained cultures of the purulence.  I then took the incision posteroanteriorly with the scalpel. The large abscess cavity was identified.  It did track up to the medial edge of the scrotum.  I opened all this area widely.  This was done with a scalpel and electrocautery.  There was no underlying necrotic subcutaneous tissue or muscle.  I thoroughly irrigated the wound significantly with normal saline.  I achieved hemostasis with cautery. I anesthetized further with Marcaine.  I then packed it with wet-to-dry saline gauze.  Dry gauze and mesh panties were then applied.  The patient tolerated the  procedure well.  All the counts were correct at the end of procedure.  The patient was then extubated in the operating room and taken in stable condition to recovery room.     Abigail Miyamoto, M.D.     DB/MEDQ  D:  12/02/2014  T:  12/03/2014  Job:  [id number]

## 2014-12-03 NOTE — Progress Notes (Signed)
Subjective: Brian Zavala reports that his pain from the I&D yesterday is well controlled but his neuropathic pain in his legs is uncontrolled. He takes MS Contin 100 mg bid for his diabetic neuropathy at home. Is asking for increase in his pain medications.   Objective: Vital signs in last 24 hours: Filed Vitals:   12/02/14 2209 12/02/14 2210 12/03/14 0111 12/03/14 0439  BP: 125/67  109/62 154/80  Pulse: 101  83 86  Temp: 97.5 F (36.4 C)  98.7 F (37.1 C) 98.4 F (36.9 C)  TempSrc: Oral  Oral Oral  Resp: 18  18 18   Height:      Weight: 185 lb 10 oz (84.2 kg)     SpO2: 92% 98% 100% 99%   Weight change:   Intake/Output Summary (Last 24 hours) at 12/03/14 1210 Last data filed at 12/03/14 1209  Gross per 24 hour  Intake 3896.67 ml  Output   1075 ml  Net 2821.67 ml    PHYSICAL EXAM GENERAL- alert, co-operative, not in any distress. CARDIAC- RRR, no murmurs, rubs or gallops. RESP- Moving equal volumes of air, and clear to auscultation bilaterally, no wheezes or crackles. ABDOMEN- Soft, nontender, no guarding or rebound,bowel sounds present. NEURO- AAO x 3 EXTREMITIES- right BKA, no pedal edema SKIN- packed wound in perineal area  Medications: I have reviewed the patient's current medications. Scheduled Meds: . aspirin  81 mg Oral Daily  . atorvastatin  10 mg Oral q1800  . cefTRIAXone (ROCEPHIN)  IV  2 g Intravenous Q24H  . enoxaparin (LOVENOX) injection  40 mg Subcutaneous Q24H  . hydrochlorothiazide  12.5 mg Oral Daily  . insulin aspart  0-15 Units Subcutaneous TID WC  . insulin aspart  0-5 Units Subcutaneous QHS  . insulin glargine  50 Units Subcutaneous QPM  . leflunomide  10 mg Oral Daily  . metronidazole  500 mg Intravenous Q6H  . nicotine  14 mg Transdermal Daily  . predniSONE  2 mg Oral Q breakfast  . pregabalin  75 mg Oral BID   Continuous Infusions: . 0.9 % NaCl with KCl 20 mEq / L 100 mL/hr at 12/02/14 2232   PRN Meds:.morphine, morphine injection,  morphine injection, ondansetron (ZOFRAN) IV, promethazine Assessment/Plan: Active Problems:   Perineal abscess   Abscess  Perineal Abscess: s/p I&D last night. WBC elevated to 17 today (14 yesterday). Afebrile today. Continue IV ABX today. Cultures pending.  - PT/OT today - Continue Ceftriaxone and Flagyl - f/u blood cultures - f/u wound cultures - CBC and BMET in am - Morphine and MSIR per surgery  DM type II with neuropathy: CBGs this morning elevated 254-272. Only received Lantus 15 units last night 2/2 being NPO. Will increase Lantus to 40 units qhs (45 at home) and start ACHS insulin. Complaining of neuropathic pain. On high dose morphine at home.  - Lantus 40 untis qhs - SSI moderate - HS correction  Hypokalemia: K 3.4 this morning. Replete with 40 mEq KCl today.   RA: On etanercept 50 mg weekly and leflunomide 10 mg daily and prednisone 2 mg daily.  - continue leflunomide and prednisone  HTN: BP elevated today.  - Restart home HCTZ 12.5 mg daily  HLD: continue atorvastatin 10 mg  DVT PPx: lovenox  Dispo: Possible discharge tomorrow.   The patient does have a current PCP 2233, MD) and does need an [facility] hospital follow-up appointment after discharge.  The patient does not have transportation limitations that hinder transportation to clinic  appointments.  .Services Needed at time of discharge: Y = Yes, Blank = No PT:   OT:   RN:   Equipment:   Other:     LOS: 1 day   Brian Nose, MD IMTS PGY-1 [phone] 12/03/2014, 12:10 PM

## 2014-12-04 ENCOUNTER — Encounter (HOSPITAL_COMMUNITY): Payer: Self-pay | Admitting: Surgery

## 2014-12-04 LAB — CBC
HCT: 35.4 % — ABNORMAL LOW (ref 39.0–52.0)
Hemoglobin: 11.8 g/dL — ABNORMAL LOW (ref 13.0–17.0)
MCH: 28.8 pg (ref 26.0–34.0)
MCHC: 33.3 g/dL (ref 30.0–36.0)
MCV: 86.3 fL (ref 78.0–100.0)
Platelets: 242 10*3/uL (ref 150–400)
RBC: 4.1 MIL/uL — ABNORMAL LOW (ref 4.22–5.81)
RDW: 13.6 % (ref 11.5–15.5)
WBC: 10.2 10*3/uL (ref 4.0–10.5)

## 2014-12-04 LAB — BASIC METABOLIC PANEL
Anion gap: 5 (ref 5–15)
BUN: 7 mg/dL (ref 6–20)
CO2: 30 mmol/L (ref 22–32)
Calcium: 7.8 mg/dL — ABNORMAL LOW (ref 8.9–10.3)
Chloride: 99 mmol/L — ABNORMAL LOW (ref 101–111)
Creatinine, Ser: 0.81 mg/dL (ref 0.61–1.24)
GFR calc Af Amer: >60 mL/min (ref >60)
GFR calc non Af Amer: >60 mL/min (ref >60)
Glucose, Bld: 149 mg/dL — ABNORMAL HIGH (ref 65–99)
Potassium: 3.9 mmol/L (ref 3.5–5.1)
Sodium: 134 mmol/L — ABNORMAL LOW (ref 135–145)

## 2014-12-04 LAB — GLUCOSE, CAPILLARY
Glucose-Capillary: 168 mg/dL — ABNORMAL HIGH (ref 65–99)
Glucose-Capillary: 255 mg/dL — ABNORMAL HIGH (ref 65–99)

## 2014-12-04 MED ORDER — CIPROFLOXACIN HCL 500 MG PO TABS: 500.0000 mg | ORAL_TABLET | Freq: Two times a day (BID) | ORAL | Status: DC

## 2014-12-04 MED ORDER — METRONIDAZOLE 500 MG PO TABS: 500.0000 mg | ORAL_TABLET | Freq: Four times a day (QID) | ORAL | Status: DC

## 2014-12-04 NOTE — Discharge Instructions (Signed)
Change the dressing twice a day as instructed.  Saline and fine mesh gauze wet-to-dry. your sister is a Engineer, civil (consulting), and I'm sure that she will do this properly  You may take a warm tub bath 2 or 3 times a day as well but change the bandage if you do take a tub bath  Avoid pressure on the open wound.  Use a doughnut or a pad or sit on your side.  You will be placed on two different antibiotics for another 7 days.  Be sure to take all of these  Return to see Dr. Abigail Miyamoto in the [facility] surgery office in 2 weeks.

## 2014-12-04 NOTE — Care Management Note (Signed)
Case Management Note  Patient Details  Name: Brian Zavala MRN: [id number] Date of Birth: 1950-05-01  Subjective/Objective:                  Incision, drainage, and irrigation of left perineal abscess.  Action/Plan: Cm called and spoke to pt who states that he does not need any HH services. CM mentioned note about possible hhRN and pt says that his sister is a Engineer, civil (consulting) and he does not want HH services. Pt did say that he would like a RW as suggested. CM offered choice and pt chose [facility] for DME needs. Pt denies any other DME needs. CM called JEff with [facility] @ [phone] to advise of DME needs and said it be delivered to PT room. Pt denies any medication needs and said that his sister would be be with him at discharge for support. No further D/C planning needs.   Expected Discharge Date:  12/04/14               Expected Discharge Plan:  Home/Self Care  In-House Referral:     Discharge planning Services  CM Consult  Post Acute Care Choice:  Durable Medical Equipment Choice offered to:  Patient  DME Arranged:  Dan Humphreys rolling DME Agency:     HH Arranged:    HH Agency:     Status of Service:  Completed, signed off  Medicare Important Message Given:    Date Medicare IM Given:    Medicare IM give by:    Date Additional Medicare IM Given:    Additional Medicare Important Message give by:     If discussed at Long Length of Stay Meetings, dates discussed:    Additional Comments:  Darcel Smalling, RN 12/04/2014, 11:53 AM

## 2014-12-04 NOTE — Discharge Summary (Signed)
Name: Brian Zavala MRN: [id number] DOB: 05-18-1950 64 y.o. PCP: Jethro Bastos, MD  Date of Admission: 12/02/2014 11:57 AM Date of Discharge: 12/05/2014 Attending Physician: Dr. Cyndie Chime  Discharge Diagnosis:  Active Problems:   Perineal abscess   Abscess   Diabetes mellitus, insulin dependent (IDDM), controlled (HCC)   Type 2 diabetes mellitus with diabetic neuropathy, with long-term current use of insulin (HCC)   S/P unilateral BKA (below knee amputation) (HCC)   Rheumatoid arthritis (HCC)  Discharge Medications:   Medication List    STOP taking these medications        oxyCODONE-acetaminophen 5-325 MG tablet  Commonly known as:  ROXICET     valsartan-hydrochlorothiazide 160-12.5 MG tablet  Commonly known as:  DIOVAN-HCT      TAKE these medications        aspirin 81 MG tablet  Take 81 mg by mouth daily.     atorvastatin 10 MG tablet  Commonly known as:  LIPITOR  Take 10 mg by mouth daily at 6 PM.     CALCIUM + D PO  Take 1 tablet by mouth every evening.     cholecalciferol 1000 UNITS tablet  Commonly known as:  VITAMIN D  Take 1,000 Units by mouth daily.     ciprofloxacin 500 MG tablet  Commonly known as:  CIPRO  Take 1 tablet (500 mg total) by mouth 2 (two) times daily.     docusate sodium 100 MG capsule  Commonly known as:  COLACE  Take 100 mg by mouth 2 (two) times daily.     etanercept 50 MG/ML injection  Commonly known as:  ENBREL  Inject 50 mg into the skin every 7 (seven) days. Wednesdays     hydrochlorothiazide 12.5 MG capsule  Commonly known as:  MICROZIDE  Take 12.5 mg by mouth daily.     insulin glargine 100 UNIT/ML injection  Commonly known as:  LANTUS  Inject 45 Units into the skin every evening.     leflunomide 10 MG tablet  Commonly known as:  ARAVA  Take 10 mg by mouth daily.     metroNIDAZOLE 500 MG tablet  Commonly known as:  FLAGYL  Take 1 tablet (500 mg total) by mouth every 6 (six) hours.     morphine 100 MG  12 hr tablet  Commonly known as:  MS CONTIN  Take 100 mg by mouth 2 (two) times daily.     predniSONE 1 MG tablet  Commonly known as:  DELTASONE  Take 2 mg by mouth daily with breakfast.     pregabalin 75 MG capsule  Commonly known as:  LYRICA  Take 75 mg by mouth 2 (two) times daily.     VICTOZA 18 MG/3ML Soln injection  Generic drug:  Liraglutide  Inject 1.8 mg into the skin every evening.       Disposition and follow-up:   [name] was discharged from [facility] in Good condition.  At the hospital follow up visit please address:  1.  Perineal Abscess: S/p I&D, discharged on cipro/flagyl for 7 more days.   2.  Labs / imaging needed at time of follow-up: None  3.  Pending labs/ test needing follow-up: Blood cultures, wound cultures  Follow-up Appointments: Follow-up Information    Follow up with [facility], MD. Schedule an appointment as soon as possible for a visit in 2 weeks.   Specialty:  General Surgery   Contact information:   [address]  [address] [place] [address] [phone]       Schedule an appointment as soon as possible for a visit with Thane Edu, MD.   Specialty:  Family Medicine   Contact information:   [address] [place] [address] [phone]       Discharge Instructions: Discharge Instructions    Diet - low sodium heart healthy    Complete by:  As directed      Discharge instructions    Complete by:  As directed   Follow up with [facility] in 2 weeks.   Continue Wet to Dry dressing changes twice a day.   Complete course of antibiotics as prescribed.     For home use only DME Walker rolling    Complete by:  As directed      Increase activity slowly    Complete by:  As directed            Consultations: Treatment Team:  Md Ccs, MD  Procedures Performed:  Ct Pelvis W Contrast  12/02/2014   CLINICAL DATA:  Worsening perineal pain and tenderness.  Perineal cellulitis and abscess.  EXAM: CT PELVIS WITH CONTRAST  TECHNIQUE: Multidetector CT imaging of the pelvis was performed using the standard protocol following the bolus administration of intravenous contrast.  CONTRAST:  OMNIPAQUE IOHEXOL 300 MG/ML  SOLN  COMPARISON:  None.  FINDINGS: A complex rim enhancing fluid collection is seen in the left-sided subcutaneous tissues of the perineum which measures 4.3 x 8.3 cm. Surrounding inflammatory changes are seen. This is consistent with abscess. No soft tissue gas is seen. This may be arising from the inferior aspect of the anal sphincter.  No intraperitoneal inflammatory process or abnormal fluid collections identified. No pelvic soft tissue masses or lymphadenopathy identified. Sigmoid diverticulosis is noted, without evidence of diverticulitis.  IMPRESSION: 8.3 cm complex fluid collection within the left perineal soft tissues consistent with abscess. This may originate from a perianal fistula. If clinically warranted, pelvic MRI without and with contrast could be performed for further/preoperative evaluation.   Electronically Signed   By: Myles Rosenthal M.D.   On: 12/02/2014 15:27   [place] Extrem Low Left Ltd  12/01/2014   CLINICAL DATA:  Cystic lesion along the left inner thigh.  EXAM: ULTRASOUND left LOWER EXTREMITY LIMITED  TECHNIQUE: Ultrasound examination of the lower extremity soft tissues was performed in the area of clinical concern.  COMPARISON:  None  FINDINGS: 8.4 by 2.2 by 4.7 cm subcutaneous complex lesion corresponding with the palpable abnormality in the left upper inner thigh. There is some enhance through transmission and the echogenic material inside this lesion appeared to be swirling at real-time sonography. No definite internal Doppler flow.  IMPRESSION: 1. Complex cystic subcutaneous lesion corresponding to the palpable abnormality along the inner thigh, measuring 8.4 by 2.2 by 4.7 cm.   Electronically Signed   By: Gaylyn Rong  M.D.   On: 12/01/2014 15:19    Admission HPI: [name] is a 64 year old male with a PMH of DM type II with neuropathy and s/p right BKA in 2005, HTN, Rheumatoid Arthritis, HLD and COPD presents to [facility] ED today with 5 day history of left groin pain and swelling. He reports that he noted pain, swelling, erythema and warmth in left groin/perinema area on Monday. Symptoms have gotten progressively worse and now complains of 10/10 pain. He was given a prescription for Doxycycline 2 days ago by his PCP but symptoms have continued to  get worse. He has had a perineal abscess on the right in 2014 that was drained by Dr. Luisa Hart. Denies any fevers, chills, N/V/D, abdominal pain. No drainage. CT done in the ED shows an 8.3 cm left perineal abscess. Surgery consulted and will I&D. He reports he has not eaten since 8:30 am.   Hospital Course by problem list: Active Problems:   Perineal abscess   Abscess   Diabetes mellitus, insulin dependent (IDDM), controlled (HCC)   Type 2 diabetes mellitus with diabetic neuropathy, with long-term current use of insulin (HCC)   S/P unilateral BKA (below knee amputation) (HCC)   Rheumatoid arthritis (HCC)   Perineal Abscess: S/p I&D on 12/02/14. WBC normalized, 10.2 on day of discharge. Patient had been afebrile. Pain controlled. Case discussed with surgery and patient discharged with follow up with surgery in 2 weeks and with [facility] PCP. He was discharged with Cipro/Flagyl for 7 more days. He is to continue wet to dry dressing changes twice a day. Blood cultures NGTD x 1 day.  DM type II with Neuropathy: Patient is on Lantus 45 units QHS and Victoza at home. He had been managed on Lantus 40 units QHS and SSI-M while hospitalized. We resumed his home medications on discharge. His neuropathy is controlled at home with opioids and Lyrica which was resumed on discharge. We did have a discussion with the patient regarding his high levels of pain medications at home but he is  resistant to reducing this medications. Also, we discussed the benefit of obtaining Narcan for home use in the event of an overdose. Patient will think about it.   Hypokalemia: Resolved, K 3.9 day of discharge.   RA: On etanercept 50 mg weekly, leflunomide 10 mg daily and prednisone 2 mg daily.   HTN: Normotensive during admission.  HLD: Continued atorvastatin 10 mg.  Discharge Vitals:   BP 127/84 mmHg  Pulse 84  Temp(Src) 98.5 F (36.9 C) (Oral)  Resp 20  Ht 6\' 2"  (1.88 m)  Wt 185 lb 10 oz (84.2 kg)  BMI 23.82 kg/m2  SpO2 99%  Discharge Labs:  No results found for this or any previous visit (from the past 24 hour(s)).  Signed: Jill Alexanders, DO PGY-2 Internal Medicine Resident Pager # [phone] 12/05/2014 4:44 PM   Services Ordered on Discharge: None Equipment Ordered on Discharge: None

## 2014-12-04 NOTE — Progress Notes (Signed)
Subjective:  Patient was seen and examined this morning, no acute events overnight. He feels well, pain controlled, no fevers or chills and would like to go home.    Objective: Filed Vitals:   12/03/14 0439 12/03/14 1443 12/03/14 2106 12/04/14 0422  BP: 154/80 131/61 116/52 127/84  Pulse: 86 94 84 84  Temp: 98.4 F (36.9 C) 97.8 F (36.6 C) 98.6 F (37 C) 98.5 F (36.9 C)  TempSrc: Oral Oral Oral Oral  Resp: 18 18 19 20   Height:      Weight:      SpO2: 99% 99% 99% 99%   General: Vital signs reviewed.  Patient is well-developed and well-nourished, in no acute distress and cooperative with exam.  Cardiovascular: RRR, S1 normal, S2 normal Pulmonary/Chest: Clear to auscultation bilaterally, no wheezes, rales, or rhonchi. Abdominal: Soft, non-tender, non-distended, BS + Extremities: S/p R BKA.  Pulses intact in LLE. No cyanosis or clubbing.  Skin: Left perineal abscess s/p I&D, well bandaged  Psychiatric: Normal mood and affect. speech and behavior is normal. Cognition and memory are normal.    Weight change:   Intake/Output Summary (Last 24 hours) at 12/04/14 1122 Last data filed at 12/04/14 0910  Gross per 24 hour  Intake 2878.33 ml  Output    950 ml  Net 1928.33 ml   Lab Results: Basic Metabolic Panel:  Recent Labs Lab 12/03/14 0520 12/04/14 0514  NA 133* 134*  K 3.4* 3.9  CL 95* 99*  CO2 27 30  GLUCOSE 208* 149*  BUN 8 7  CREATININE 0.70 0.81  CALCIUM 8.4* 7.8*  MG 1.8  --    Liver Function Tests:  Recent Labs Lab 12/02/14 1310  AST 23  ALT 17  ALKPHOS 66  BILITOT 1.3*  PROT 7.1  ALBUMIN 3.0*   CBC:  Recent Labs Lab 12/02/14 1310 12/03/14 0520 12/04/14 0514  WBC 14.0* 17.1* 10.2  NEUTROABS 11.8*  --   --   HGB 14.8 14.2 11.8*  HCT 41.8 41.0 35.4*  MCV 83.9 84.9 86.3  PLT 255 267 242   CBG:  Recent Labs Lab 12/03/14 0751 12/03/14 1207 12/03/14 1539 12/03/14 1713 12/03/14 2105 12/04/14 0740  GLUCAP 254* 272* 318* 272* 265*  168*   Micro Results: Recent Results (from the past 240 hour(s))  Blood culture (routine x 2)     Status: None (Preliminary result)   Collection Time: 12/02/14  2:40 PM  Result Value Ref Range Status   Specimen Description BLOOD LEFT ARM  Final   Special Requests BOTTLES DRAWN AEROBIC ONLY 2CC  Final   Culture NO GROWTH < 24 HOURS  Final   Report Status PENDING  Incomplete  Blood culture (routine x 2)     Status: None (Preliminary result)   Collection Time: 12/02/14  3:26 PM  Result Value Ref Range Status   Specimen Description BLOOD RIGHT ARM  Final   Special Requests BOTTLES DRAWN AEROBIC AND ANAEROBIC 5CC  Final   Culture NO GROWTH < 24 HOURS  Final   Report Status PENDING  Incomplete  Culture, routine-abscess     Status: None (Preliminary result)   Collection Time: 12/02/14  9:07 PM  Result Value Ref Range Status   Specimen Description ABSCESS  Final   Special Requests LEFT PERINEAL  PT ON ZOSYN FLAGEYL  Final   Gram Stain PENDING  Incomplete   Culture   Final    NO GROWTH 1 DAY Performed at 12/04/14    Report  Status PENDING  Incomplete  Anaerobic culture     Status: None (Preliminary result)   Collection Time: 12/02/14  9:07 PM  Result Value Ref Range Status   Specimen Description ABSCESS  Final   Special Requests LEFT PERINEAL PT ON ZOSYN FLAGEYL  Final   Gram Stain PENDING  Incomplete   Culture   Final    NO ANAEROBES ISOLATED; CULTURE IN PROGRESS FOR 5 DAYS Performed at [facility]    Report Status PENDING  Incomplete   Studies/Results: Ct Pelvis W Contrast  12/02/2014   CLINICAL DATA:  Worsening perineal pain and tenderness. Perineal cellulitis and abscess.  EXAM: CT PELVIS WITH CONTRAST  TECHNIQUE: Multidetector CT imaging of the pelvis was performed using the standard protocol following the bolus administration of intravenous contrast.  CONTRAST:  OMNIPAQUE IOHEXOL 300 MG/ML  SOLN  COMPARISON:  None.  FINDINGS: A complex rim enhancing  fluid collection is seen in the left-sided subcutaneous tissues of the perineum which measures 4.3 x 8.3 cm. Surrounding inflammatory changes are seen. This is consistent with abscess. No soft tissue gas is seen. This may be arising from the inferior aspect of the anal sphincter.  No intraperitoneal inflammatory process or abnormal fluid collections identified. No pelvic soft tissue masses or lymphadenopathy identified. Sigmoid diverticulosis is noted, without evidence of diverticulitis.  IMPRESSION: 8.3 cm complex fluid collection within the left perineal soft tissues consistent with abscess. This may originate from a perianal fistula. If clinically warranted, pelvic MRI without and with contrast could be performed for further/preoperative evaluation.   Electronically Signed   By: Myles Rosenthal M.D.   On: 12/02/2014 15:27   Medications:  I have reviewed the patient's current medications. Prior to Admission:  Prescriptions prior to admission  Medication Sig Dispense Refill Last Dose  . aspirin 81 MG tablet Take 81 mg by mouth daily.   12/02/2014 at Unknown time  . atorvastatin (LIPITOR) 10 MG tablet Take 10 mg by mouth daily at 6 PM.   12/01/2014 at Unknown time  . Calcium Carbonate-Vitamin D (CALCIUM + D PO) Take 1 tablet by mouth every evening.    12/01/2014 at Unknown time  . cholecalciferol (VITAMIN D) 1000 UNITS tablet Take 1,000 Units by mouth daily.   12/02/2014 at Unknown time  . docusate sodium (COLACE) 100 MG capsule Take 100 mg by mouth 2 (two) times daily.   12/02/2014 at Unknown time  . hydrochlorothiazide (MICROZIDE) 12.5 MG capsule Take 12.5 mg by mouth daily.   12/02/2014 at Unknown time  . insulin glargine (LANTUS) 100 UNIT/ML injection Inject 45 Units into the skin every evening.    12/01/2014 at Unknown time  . leflunomide (ARAVA) 10 MG tablet Take 10 mg by mouth daily.   12/02/2014 at Unknown time  . Liraglutide (VICTOZA) 18 MG/3ML SOLN injection Inject 1.8 mg into the skin every evening.     12/01/2014 at Unknown time  . morphine (MS CONTIN) 100 MG 12 hr tablet Take 100 mg by mouth 2 (two) times daily.   12/02/2014 at Unknown time  . predniSONE (DELTASONE) 1 MG tablet Take 2 mg by mouth daily with breakfast.   12/02/2014 at Unknown time  . pregabalin (LYRICA) 75 MG capsule Take 75 mg by mouth 2 (two) times daily.   12/02/2014 at Unknown time  . etanercept (ENBREL) 50 MG/ML injection Inject 50 mg into the skin every 7 (seven) days. Wednesdays   11/30/2014  . oxyCODONE-acetaminophen (ROXICET) 5-325 MG per tablet Take 1-2 tablets  by mouth every 4 (four) hours as needed. (Patient not taking: Reported on 12/02/2014) 30 tablet 0   . predniSONE (DELTASONE) 5 MG tablet Take 2 mg by mouth daily.   10/25/2013 at Unknown time  . pregabalin (LYRICA) 100 MG capsule Take 100-200 mg by mouth 2 (two) times daily. 1 capsule (100 mg) in the am and 2 capsules (200 mg) in the pm   10/26/2013 at 0500  . valsartan-hydrochlorothiazide (DIOVAN-HCT) 160-12.5 MG per tablet Take 1 tablet by mouth daily.   10/25/2013 at Unknown time   Scheduled Meds: . aspirin  81 mg Oral Daily  . atorvastatin  10 mg Oral q1800  . cefTRIAXone (ROCEPHIN)  IV  2 g Intravenous Q24H  . enoxaparin (LOVENOX) injection  40 mg Subcutaneous Q24H  . hydrochlorothiazide  12.5 mg Oral Daily  . insulin aspart  0-15 Units Subcutaneous TID WC  . insulin aspart  0-5 Units Subcutaneous QHS  . insulin glargine  40 Units Subcutaneous QPM  . leflunomide  10 mg Oral Daily  . metronidazole  500 mg Intravenous Q6H  . nicotine  14 mg Transdermal Daily  . predniSONE  2 mg Oral Q breakfast  . pregabalin  75 mg Oral BID   Continuous Infusions: . 0.9 % NaCl with KCl 20 mEq / L 100 mL/hr at 12/04/14 0309   PRN Meds:.morphine, morphine injection, ondansetron (ZOFRAN) IV Assessment/Plan: Active Problems:   Perineal abscess   Abscess   Diabetes mellitus, insulin dependent (IDDM), controlled (HCC)   Type 2 diabetes mellitus with diabetic neuropathy, with  long-term current use of insulin (HCC)   S/P unilateral BKA (below knee amputation) (HCC)   Rheumatoid arthritis (HCC)  Perineal Abscess: S/p I&D on 12/02/14. WBC has normalized, 10.2 today. Patient has been afebrile. Pain controlled. Case discussed with surgery and patient will be discharged to home today with follow up with surgery in 2 weeks and with [facility] PCP. He will be discharged with Cipro/Flagyl for 7 more days. He is to continue wet to dry dressing changes twice a day. Blood cultures NGTD x 1 day. -Ciprofloxacin 500 mg BID x 7 days -Flagyl 500 mg Q6H x 7 days -Follow up with [facility] and [facility] -Continue dressing changes -Continue home regimen for pain control  DM type II with Neuropathy: Patient is on Lantus 45 units QHS and Victoza at home. He has been managed on Lantus 40 units QHS and SSI-M while hospitalized. We will resume his home medications on discharge. His neuropathy is controlled at home with opioids and Lyrica which will be resumed on discharge. We did have a discussion with the patient regarding his high levels of pain medications at home but he is resistant to reducing this medications. Also, we discussed the benefit of obtaining Narcan for home use in the event of an overdose. Patient will think about it.  -Lantus 40 units QHS -SSI moderate -HS correction -Resume home regimen on discharge.  Hypokalemia: Resolved, K 3.9 this morning.    RA: On etanercept 50 mg weekly, leflunomide 10 mg daily and prednisone 2 mg daily.  -Continue leflunomide and prednisone  HTN: Normotensive at 127/84 -On home HCTZ 12.5 mg daily  HLD: Continue atorvastatin 10 mg.  DVT PPx: Lovenox SQ  Dispo: Anticipated discharge today.   The patient does have a current PCP Jethro Bastos, MD) and does not need an [facility] hospital follow-up appointment after discharge.  The patient does not have transportation limitations that hinder transportation to clinic appointments.  .Services Needed  at time of  discharge: Y = Yes, Blank = No PT:   OT:   RN:   Equipment:   Other:     LOS: 2 days   Jill Alexanders, DO PGY-1 Internal Medicine Resident Pager # [phone] 12/04/2014 11:22 AM

## 2014-12-04 NOTE — Progress Notes (Signed)
2 Days Post-Op  Subjective: He is doing much better and wants to go home Fever has resolved Leukocytosis has resolved Pain is much better Cultures do not show anything and so may not be useful at this time  Medical problems are stable according to teaching service resident so we will let him go home He will be placed on another 7 days of antibiotics I met his sister who is a nurse with [facility] and she will change the dressing twice a day so he will have excellent home care  Objective: Vital signs in last 24 hours: Temp:  [97.8 F (36.6 C)-98.6 F (37 C)] 98.5 F (36.9 C) (10/02 0422) Pulse Rate:  [84-94] 84 (10/02 0422) Resp:  [18-20] 20 (10/02 0422) BP: (116-131)/(52-84) 127/84 mmHg (10/02 0422) SpO2:  [99 %] 99 % (10/02 0422)    Intake/Output from previous day: 10/01 0701 - 10/02 0700 In: 2628.3 [P.O.:350; I.V.:2278.3] Out: 950 [Urine:950] Intake/Output this shift: Total I/O In: 250 [P.O.:250] Out: -   General appearance: Alert.  Spirits much better.  No distress.  Medical resident in room.  Sister. in room. GI: soft, non-tender; bowel sounds normal; no masses,  no organomegaly Incision/Wound: Packed open.  Clean.  No bleeding or odor.  Infection well controlled.  Lab Results:  Results for orders placed or performed during the hospital encounter of 12/02/14 (from the past 24 hour(s))  Glucose, capillary     Status: Abnormal   Collection Time: 12/03/14 12:07 PM  Result Value Ref Range   Glucose-Capillary 272 (H) 65 - 99 mg/dL   Comment 1 Notify RN   Glucose, capillary     Status: Abnormal   Collection Time: 12/03/14  3:39 PM  Result Value Ref Range   Glucose-Capillary 318 (H) 65 - 99 mg/dL   Comment 1 Notify RN   Glucose, capillary     Status: Abnormal   Collection Time: 12/03/14  5:13 PM  Result Value Ref Range   Glucose-Capillary 272 (H) 65 - 99 mg/dL   Comment 1 Notify RN   Glucose, capillary     Status: Abnormal   Collection Time: 12/03/14  9:05 PM  Result  Value Ref Range   Glucose-Capillary 265 (H) 65 - 99 mg/dL  CBC     Status: Abnormal   Collection Time: 12/04/14  5:14 AM  Result Value Ref Range   WBC 10.2 4.0 - 10.5 K/uL   RBC 4.10 (L) 4.22 - 5.81 MIL/uL   Hemoglobin 11.8 (L) 13.0 - 17.0 g/dL   HCT 35.4 (L) 39.0 - 52.0 %   MCV 86.3 78.0 - 100.0 fL   MCH 28.8 26.0 - 34.0 pg   MCHC 33.3 30.0 - 36.0 g/dL   RDW 13.6 11.5 - 15.5 %   Platelets 242 150 - 400 K/uL  Basic metabolic panel     Status: Abnormal   Collection Time: 12/04/14  5:14 AM  Result Value Ref Range   Sodium 134 (L) 135 - 145 mmol/L   Potassium 3.9 3.5 - 5.1 mmol/L   Chloride 99 (L) 101 - 111 mmol/L   CO2 30 22 - 32 mmol/L   Glucose, Bld 149 (H) 65 - 99 mg/dL   BUN 7 6 - 20 mg/dL   Creatinine, Ser 0.81 0.61 - 1.24 mg/dL   Calcium 7.8 (L) 8.9 - 10.3 mg/dL   GFR calc non Af Amer >60 >60 mL/min   GFR calc Af Amer >60 >60 mL/min   Anion gap 5 5 - 15  Glucose, capillary     Status: Abnormal   Collection Time: 12/04/14  7:40 AM  Result Value Ref Range   Glucose-Capillary 168 (H) 65 - 99 mg/dL   Comment 1 Notify RN      Studies/Results: No results found.  Marland Kitchen aspirin  81 mg Oral Daily  . atorvastatin  10 mg Oral q1800  . cefTRIAXone (ROCEPHIN)  IV  2 g Intravenous Q24H  . enoxaparin (LOVENOX) injection  40 mg Subcutaneous Q24H  . hydrochlorothiazide  12.5 mg Oral Daily  . insulin aspart  0-15 Units Subcutaneous TID WC  . insulin aspart  0-5 Units Subcutaneous QHS  . insulin glargine  40 Units Subcutaneous QPM  . leflunomide  10 mg Oral Daily  . metronidazole  500 mg Intravenous Q6H  . nicotine  14 mg Transdermal Daily  . predniSONE  2 mg Oral Q breakfast  . pregabalin  75 mg Oral BID     Assessment/Plan: s/p Procedure(s): IRRIGATION AND DEBRIDEMENT PERINEAL ABSCESS  POD #2. Drainage and debridement of perineal abscess. Doing very well with good control of local infection Discharge home today His sister was instructed in twice a day wet to dry  dressings and sitz baths I advised another 7 days of antibiotics, either Augmentin or Cipro and Flagyl. Follow-up with Dr. Coralie Keens in our office in 2 weeks.  Diabetes.Per medicine teaching service  Diabetic neuropathy. Chronic morphine therapy at home.   _0 @  LOS: 2 days    INGRAM,HAYWOOD M 12/04/2014  . .prob

## 2014-12-04 NOTE — Evaluation (Signed)
Physical Therapy Evaluation Patient Details Name: Brian Zavala MRN: [id number] DOB: 1950/03/20 Today's Date: 12/04/2014   History of Present Illness  64 year old man with insulin-dependent diabetes complicated by severe peripheral neuropathy and peripheral vascular disease status post right below the knee amputation. He has rheumatoid arthritis and is on chronic prednisone and Enbrel. He presents with a recurrent left inguinal/perineal abscess. Signs and symptoms progressed on Doxy cyclines started 2 days ago. He presented to the emergency department on 12/02/2014. CT scan showed a 8 cm left perineal abscess. Abscess previously drained on the right side in 2014. He was seen in consultation by general surgery. He underwent incision and drainage under anesthesia on 9/30  Clinical Impression   Patient is s/p above surgery resulting in functional limitations due to the deficits listed below (see PT Problem List).  Patient will benefit from skilled PT to increase their independence and safety with mobility to allow discharge to the venue listed below.       Follow Up Recommendations No PT follow up;Other (comment) (more RN needs for dressing changes than PT needs; He states he will request PT through [facility] if he gets home and realizes he needs it)    Equipment Recommendations  Rolling walker with 5" wheels    Recommendations for Other Services OT consult     Precautions / Restrictions Precautions Precautions: Fall Precaution Comments: Fall risk greatly reduced with use of assistive device Required Braces or Orthoses: Other Brace/Splint Other Brace/Splint: R prosthesis      Mobility  Bed Mobility Overal bed mobility: Modified Independent             General bed mobility comments: Slower with somewhat inefficient movements, but not unsafe and not needing physical assist  Transfers Overall transfer level: Needs assistance Equipment used: Straight cane Transfers: Sit to/from  Stand Sit to Stand: Supervision         General transfer comment: Supervision for safety and management of lines  Ambulation/Gait Ambulation/Gait assistance: Min guard (without physical contact) Ambulation Distance (Feet): 10 Feet (in room amb) Assistive device: Straight cane Gait Pattern/deviations: Decreased step length - right;Decreased step length - left;Trunk flexed     General Gait Details: R porsthesis on, cane in R hand; able to stand up straight, but needing bilateral UE support to maintian upright posture with the dynamic weight shifts required for walking; tending to reach out for bil UE support, using furniture for support this walk; Agreeable to consider RW for amb at this time  Stairs            Wheelchair Mobility    Modified Rankin (Stroke Patients Only)       Balance Overall balance assessment: Needs assistance           Standing balance-Leahy Scale: Fair                               Pertinent Vitals/Pain Pain Assessment: 0-10 Pain Score: 4  Pain Location: at perineal/perirectal abscess Pain Descriptors / Indicators: Aching Pain Intervention(s): Limited activity within patient's tolerance;Monitored during session;Repositioned    Home Living Family/patient expects to be discharged to:: Private residence Living Arrangements: Alone Available Help at Discharge: Family;Available PRN/intermittently Type of Home: Mobile home       Home Layout: One level Home Equipment: Cane - single point;Walker - standard;Wheelchair - power;Hospital bed      Prior Function Level of Independence: Independent with assistive device(s)  Comments: Gilmer Mor (and prosthesis) for walking near full-time; can use his power chair inside and outside     Hand Dominance        Extremity/Trunk Assessment   Upper Extremity Assessment: Overall WFL for tasks assessed           Lower Extremity Assessment:  (R BKA, strength and range WFL; See  General comments as well;  Overall moving both LEs slowly and guardedly, most likely in anticipation of pain with motion; Noting dependence on UE support as well         Communication   Communication: No difficulties  Cognition Arousal/Alertness: Awake/alert Behavior During Therapy: WFL for tasks assessed/performed Overall Cognitive Status: Within Functional Limits for tasks assessed                      General Comments General comments (skin integrity, edema, etc.): Noted area of pressure distal R lateral reidual limb; pt reports he is watching it closely and his prosthetist is aware; He is also requesting a donut to sit on for comfort; We discussed the likelihood that using a "donut cushion" for sitting may actually cause more harm, impeding blood flow to the area of the wound (it is for that reason that [facility] no longer stocks them); We discussed options for cushion/pillow positioning for comfort     Exercises        Assessment/Plan    PT Assessment Patient needs continued PT services  PT Diagnosis Difficulty walking;Acute pain   PT Problem List Decreased strength;Decreased range of motion;Decreased activity tolerance;Decreased balance;Decreased mobility;Decreased knowledge of use of DME;Pain  PT Treatment Interventions DME instruction;Gait training;Functional mobility training;Therapeutic activities;Therapeutic exercise;Balance training;Patient/family education   PT Goals (Current goals can be found in the Care Plan section) Acute Rehab PT Goals Patient Stated Goal: Hopes to get home soon PT Goal Formulation: With patient Time For Goal Achievement: 12/11/14 Potential to Achieve Goals: Good    Frequency Min 3X/week   Barriers to discharge        Co-evaluation               End of Session Equipment Utilized During Treatment:  (R prosthesis) Activity Tolerance: Patient tolerated treatment well Patient left: in chair;with call bell/phone within reach;with  family/visitor present Nurse Communication: Mobility status         Time: 9675-9163 PT Time Calculation (min) (ACUTE ONLY): 21 min   Charges:   PT Evaluation $Initial PT Evaluation Tier I: 1 Procedure     PT G CodesVan Clines Hamff 12/04/2014, 10:38 AM  Van Clines, PT  Acute Rehabilitation Services Pager [phone] Office [phone]

## 2014-12-05 LAB — HEMOGLOBIN A1C
Hgb A1c MFr Bld: 9.4 % — ABNORMAL HIGH (ref 4.8–5.6)
Mean Plasma Glucose: 223 mg/dL

## 2014-12-05 LAB — GLUCOSE, CAPILLARY: Glucose-Capillary: 208 mg/dL — ABNORMAL HIGH (ref 65–99)

## 2014-12-06 LAB — CULTURE, ROUTINE-ABSCESS

## 2014-12-07 LAB — CULTURE, BLOOD (ROUTINE X 2)
Culture: NO GROWTH
Culture: NO GROWTH

## 2014-12-08 LAB — ANAEROBIC CULTURE

## 2015-02-10 ENCOUNTER — Other Ambulatory Visit (HOSPITAL_BASED_OUTPATIENT_CLINIC_OR_DEPARTMENT_OTHER): Payer: Self-pay | Admitting: Orthopaedic Surgery

## 2015-02-10 ENCOUNTER — Encounter (HOSPITAL_BASED_OUTPATIENT_CLINIC_OR_DEPARTMENT_OTHER): Payer: Self-pay | Admitting: *Deleted

## 2015-02-15 ENCOUNTER — Ambulatory Visit (HOSPITAL_BASED_OUTPATIENT_CLINIC_OR_DEPARTMENT_OTHER): Payer: Medicare (Managed Care) | Admitting: Certified Registered"

## 2015-02-15 ENCOUNTER — Encounter (HOSPITAL_BASED_OUTPATIENT_CLINIC_OR_DEPARTMENT_OTHER)
Admission: RE | Disposition: A | Discharge status: Home / Self Care | Payer: Self-pay | Source: Ambulatory Visit | Attending: Orthopaedic Surgery

## 2015-02-15 ENCOUNTER — Ambulatory Visit (HOSPITAL_BASED_OUTPATIENT_CLINIC_OR_DEPARTMENT_OTHER)
Admission: RE | Admit: 2015-02-15 | Discharge: 2015-02-15 | Disposition: A | Discharge status: Home / Self Care | Payer: Medicare (Managed Care) | Source: Ambulatory Visit | Attending: Orthopaedic Surgery | Admitting: Orthopaedic Surgery

## 2015-02-15 ENCOUNTER — Encounter (HOSPITAL_BASED_OUTPATIENT_CLINIC_OR_DEPARTMENT_OTHER): Payer: Self-pay | Admitting: Certified Registered"

## 2015-02-15 DIAGNOSIS — Z89519 Acquired absence of unspecified leg below knee: Secondary | ICD-10-CM | POA: Diagnosis not present

## 2015-02-15 DIAGNOSIS — F17210 Nicotine dependence, cigarettes, uncomplicated: Secondary | ICD-10-CM | POA: Diagnosis not present

## 2015-02-15 DIAGNOSIS — Z79899 Other long term (current) drug therapy: Secondary | ICD-10-CM | POA: Diagnosis not present

## 2015-02-15 DIAGNOSIS — M06341 Rheumatoid nodule, right hand: Secondary | ICD-10-CM | POA: Insufficient documentation

## 2015-02-15 DIAGNOSIS — E785 Hyperlipidemia, unspecified: Secondary | ICD-10-CM | POA: Diagnosis not present

## 2015-02-15 DIAGNOSIS — Z7982 Long term (current) use of aspirin: Secondary | ICD-10-CM | POA: Diagnosis not present

## 2015-02-15 DIAGNOSIS — M06342 Rheumatoid nodule, left hand: Secondary | ICD-10-CM | POA: Diagnosis present

## 2015-02-15 DIAGNOSIS — I10 Essential (primary) hypertension: Secondary | ICD-10-CM | POA: Insufficient documentation

## 2015-02-15 DIAGNOSIS — M1990 Unspecified osteoarthritis, unspecified site: Secondary | ICD-10-CM | POA: Insufficient documentation

## 2015-02-15 DIAGNOSIS — F17200 Nicotine dependence, unspecified, uncomplicated: Secondary | ICD-10-CM | POA: Insufficient documentation

## 2015-02-15 DIAGNOSIS — Z7952 Long term (current) use of systemic steroids: Secondary | ICD-10-CM | POA: Diagnosis not present

## 2015-02-15 DIAGNOSIS — Z794 Long term (current) use of insulin: Secondary | ICD-10-CM | POA: Diagnosis not present

## 2015-02-15 DIAGNOSIS — E1140 Type 2 diabetes mellitus with diabetic neuropathy, unspecified: Secondary | ICD-10-CM | POA: Insufficient documentation

## 2015-02-15 DIAGNOSIS — Z79891 Long term (current) use of opiate analgesic: Secondary | ICD-10-CM | POA: Insufficient documentation

## 2015-02-15 DIAGNOSIS — Z9049 Acquired absence of other specified parts of digestive tract: Secondary | ICD-10-CM | POA: Diagnosis not present

## 2015-02-15 DIAGNOSIS — J449 Chronic obstructive pulmonary disease, unspecified: Secondary | ICD-10-CM | POA: Diagnosis not present

## 2015-02-15 HISTORY — PX: CYST EXCISION: SHX5701

## 2015-02-15 LAB — GLUCOSE, CAPILLARY
Glucose-Capillary: 190 mg/dL — ABNORMAL HIGH (ref 65–99)
Glucose-Capillary: 142 mg/dL — ABNORMAL HIGH (ref 65–99)

## 2015-02-15 SURGERY — CYST REMOVAL
Anesthesia: General | Site: Finger | Laterality: Bilateral

## 2015-02-15 MED ORDER — GLYCOPYRROLATE 0.2 MG/ML IJ SOLN: 0.2000 mg | Freq: Once | INTRAMUSCULAR | Status: DC | PRN

## 2015-02-15 MED ORDER — ONDANSETRON HCL 4 MG/2ML IJ SOLN: 4.0000 mg | Freq: Once | INTRAMUSCULAR | Status: DC | PRN

## 2015-02-15 MED ORDER — MIDAZOLAM HCL 2 MG/2ML IJ SOLN: 1.0000 mg | INTRAMUSCULAR | Status: DC | PRN

## 2015-02-15 MED ORDER — LACTATED RINGERS IV SOLN
INTRAVENOUS | Status: DC
  Administered 2015-02-15: 10:00:00 via INTRAVENOUS

## 2015-02-15 MED ORDER — PROPOFOL 500 MG/50ML IV EMUL
INTRAVENOUS | Status: AC
  Filled 2015-02-15: qty 50

## 2015-02-15 MED ORDER — MIDAZOLAM HCL 5 MG/5ML IJ SOLN
INTRAMUSCULAR | Status: DC | PRN
  Administered 2015-02-15: 1 mg via INTRAVENOUS

## 2015-02-15 MED ORDER — ONDANSETRON HCL 4 MG/2ML IJ SOLN
INTRAMUSCULAR | Status: AC
  Filled 2015-02-15: qty 2

## 2015-02-15 MED ORDER — PHENYLEPHRINE HCL 10 MG/ML IJ SOLN
INTRAMUSCULAR | Status: DC | PRN
  Administered 2015-02-15: 40 ug via INTRAVENOUS

## 2015-02-15 MED ORDER — HYDROMORPHONE HCL 1 MG/ML IJ SOLN: 0.2500 mg | INTRAMUSCULAR | Status: DC | PRN

## 2015-02-15 MED ORDER — CEFAZOLIN SODIUM-DEXTROSE 2-3 GM-% IV SOLR
2.0000 g | INTRAVENOUS | Status: AC
  Administered 2015-02-15: 2 g via INTRAVENOUS

## 2015-02-15 MED ORDER — PROPOFOL 10 MG/ML IV BOLUS
INTRAVENOUS | Status: DC | PRN
  Administered 2015-02-15: 120 mg via INTRAVENOUS

## 2015-02-15 MED ORDER — DEXAMETHASONE SODIUM PHOSPHATE 10 MG/ML IJ SOLN
INTRAMUSCULAR | Status: AC
  Filled 2015-02-15: qty 1

## 2015-02-15 MED ORDER — MIDAZOLAM HCL 2 MG/2ML IJ SOLN
INTRAMUSCULAR | Status: AC
  Filled 2015-02-15: qty 2

## 2015-02-15 MED ORDER — LIDOCAINE HCL (CARDIAC) 20 MG/ML IV SOLN
INTRAVENOUS | Status: DC | PRN
  Administered 2015-02-15: 60 mg via INTRAVENOUS

## 2015-02-15 MED ORDER — ONDANSETRON HCL 4 MG/2ML IJ SOLN
INTRAMUSCULAR | Status: DC | PRN
  Administered 2015-02-15: 4 mg via INTRAVENOUS

## 2015-02-15 MED ORDER — FENTANYL CITRATE (PF) 100 MCG/2ML IJ SOLN
INTRAMUSCULAR | Status: DC | PRN
  Administered 2015-02-15: 50 ug via INTRAVENOUS

## 2015-02-15 MED ORDER — MEPERIDINE HCL 25 MG/ML IJ SOLN: 6.2500 mg | INTRAMUSCULAR | Status: DC | PRN

## 2015-02-15 MED ORDER — LIDOCAINE HCL (CARDIAC) 20 MG/ML IV SOLN
INTRAVENOUS | Status: AC
  Filled 2015-02-15: qty 5

## 2015-02-15 MED ORDER — BUPIVACAINE HCL (PF) 0.25 % IJ SOLN
INTRAMUSCULAR | Status: DC | PRN
  Administered 2015-02-15 (×2): 2 mL

## 2015-02-15 MED ORDER — FENTANYL CITRATE (PF) 100 MCG/2ML IJ SOLN
INTRAMUSCULAR | Status: AC
  Filled 2015-02-15: qty 2

## 2015-02-15 MED ORDER — SCOPOLAMINE 1 MG/3DAYS TD PT72: 1.0000 | MEDICATED_PATCH | Freq: Once | TRANSDERMAL | Status: DC

## 2015-02-15 MED ORDER — HYDROCODONE-ACETAMINOPHEN 5-325 MG PO TABS: 1.0000 | ORAL_TABLET | Freq: Four times a day (QID) | ORAL | Status: DC | PRN

## 2015-02-15 MED ORDER — DEXAMETHASONE SODIUM PHOSPHATE 10 MG/ML IJ SOLN
INTRAMUSCULAR | Status: DC | PRN
  Administered 2015-02-15: 4 mg via INTRAVENOUS

## 2015-02-15 MED ORDER — FENTANYL CITRATE (PF) 100 MCG/2ML IJ SOLN: 50.0000 ug | INTRAMUSCULAR | Status: DC | PRN

## 2015-02-15 SURGICAL SUPPLY — 44 items
BANDAGE ELASTIC 6 VELCRO ST LF (GAUZE/BANDAGES/DRESSINGS) IMPLANT
BLADE SURG 15 STRL LF DISP TIS (BLADE) ×2 IMPLANT
BLADE SURG 15 STRL SS (BLADE) ×2
BNDG COHESIVE 1X5 TAN STRL LF (GAUZE/BANDAGES/DRESSINGS) ×2 IMPLANT
BNDG CONFORM 2 STRL LF (GAUZE/BANDAGES/DRESSINGS) ×2 IMPLANT
BNDG ESMARK 4X9 LF (GAUZE/BANDAGES/DRESSINGS) ×2 IMPLANT
BRUSH SCRUB EZ PLAIN DRY (MISCELLANEOUS) ×4 IMPLANT
CORDS BIPOLAR (ELECTRODE) ×2 IMPLANT
COVER BACK TABLE 80X110 HD (DRAPES) ×2 IMPLANT
COVER MAYO STAND STRL (DRAPES) ×4 IMPLANT
CUFF TOURNIQUET SINGLE 18IN (TOURNIQUET CUFF) ×4 IMPLANT
DECANTER SPIKE VIAL GLASS SM (MISCELLANEOUS) ×2 IMPLANT
DRAPE EXTREMITY T 121X128X90 (DRAPE) ×4 IMPLANT
DRAPE SURG 17X23 STRL (DRAPES) ×4 IMPLANT
DRAPE U 20/CS (DRAPES) IMPLANT
GAUZE SPONGE 4X4 12PLY STRL (GAUZE/BANDAGES/DRESSINGS) ×2 IMPLANT
GAUZE SPONGE 4X4 16PLY XRAY LF (GAUZE/BANDAGES/DRESSINGS) IMPLANT
GAUZE XEROFORM 1X8 LF (GAUZE/BANDAGES/DRESSINGS) ×2 IMPLANT
GLOVE BIOGEL PI IND STRL 7.0 (GLOVE) ×3 IMPLANT
GLOVE BIOGEL PI INDICATOR 7.0 (GLOVE) ×3
GLOVE ECLIPSE 6.5 STRL STRAW (GLOVE) ×2 IMPLANT
GLOVE NEODERM STRL 7.5 LF PF (GLOVE) ×1 IMPLANT
GLOVE SURG NEODERM 7.5  LF PF (GLOVE) ×1
GLOVE SURG SYN 7.5  E (GLOVE) ×1
GLOVE SURG SYN 7.5 E (GLOVE) ×1 IMPLANT
GOWN STRL REIN XL XLG (GOWN DISPOSABLE) ×2 IMPLANT
GOWN STRL REUS W/ TWL LRG LVL3 (GOWN DISPOSABLE) ×1 IMPLANT
GOWN STRL REUS W/TWL LRG LVL3 (GOWN DISPOSABLE) ×1
NEEDLE HYPO 25X1 1.5 SAFETY (NEEDLE) ×2 IMPLANT
NS IRRIG 1000ML POUR BTL (IV SOLUTION) ×2 IMPLANT
PACK BASIN DAY SURGERY FS (CUSTOM PROCEDURE TRAY) ×2 IMPLANT
RUBBERBAND STERILE (MISCELLANEOUS) ×4 IMPLANT
STOCKINETTE 4X48 STRL (DRAPES) ×4 IMPLANT
SUT ETHILON 2 0 FS 18 (SUTURE) IMPLANT
SUT ETHILON 4 0 PS 2 18 (SUTURE) ×2 IMPLANT
SUT VIC AB 0 CT1 27 (SUTURE)
SUT VIC AB 0 CT1 27XBRD ANBCTR (SUTURE) IMPLANT
SUT VIC AB 2-0 CT1 27 (SUTURE)
SUT VIC AB 2-0 CT1 TAPERPNT 27 (SUTURE) IMPLANT
SYR BULB 3OZ (MISCELLANEOUS) ×2 IMPLANT
SYR CONTROL 10ML LL (SYRINGE) ×2 IMPLANT
TOWEL OR 17X24 6PK STRL BLUE (TOWEL DISPOSABLE) ×4 IMPLANT
TOWEL OR NON WOVEN STRL DISP B (DISPOSABLE) IMPLANT
TRAY DSU PREP LF (CUSTOM PROCEDURE TRAY) ×4 IMPLANT

## 2015-02-15 NOTE — Op Note (Signed)
   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  PREOPERATIVE DIAGNOSIS:  1. Left index finger rheumatoid nodule 2. Right middle finger rheumatoid nodule  POSTOPERATIVE DIAGNOSIS: Same.  PROCEDURE:  1. Excision of left index finger rheumatoid nodule 2. Excision of right middle finger rheumatoid nodule, separate incision  SURGEON: N. Glee Arvin, M.D.  ASSIST: None.  ANESTHESIA:  general  IV FLUIDS AND URINE: See anesthesia.  ESTIMATED BLOOD LOSS: Minimal mL.  IMPLANTS: None  DRAINS: None  COMPLICATIONS: None.  DESCRIPTION OF PROCEDURE: The patient was brought to the operating room and placed supine on the operating table.  The patient had been signed prior to the procedure and this was documented. The patient had the anesthesia placed by the anesthesiologist.  A time-out was performed to confirm that this was the correct patient, site, side and location. The patient did receive antibiotics prior to the incision and was re-dosed during the procedure as needed at indicated intervals.  A tourniquet placed And inflated to 250 mmHg.  The patient had the operative extremities prepped and draped in the standard surgical fashion.    We first started with the left index finger. A longitudinal incision directly over the rheumatoid nodule was created. Full-thickness flaps were developed. The rheumatoid nodule was excised as a whole.  The nodule did not penetrate any deeper than the subcutaneous layer. This was thoroughly irrigated and closed with 4-0 nylon sutures. Sterile dressings were applied. Local anesthesia was infiltrated. We then turned our attention to the right middle finger rheumatoid nodule. A zigzag Brunner's incision was treated over the rheumatoid nodule on the volar aspect of the long finger. Full-thickness  flaps were created. The rheumatoid nodule was then excised as a whole. There was no tendon involvement. Both samples were sent to pathology. The wounds were thoroughly irrigated. Closed with 3-0 nylon sutures. Sterile dressings were applied. Patient tolerated procedure well was expanded and transferred to the PACU in stable condition.  POSTOPERATIVE PLAN: Patient will be discharged home with follow-up in 2 weeks for wound check.  Mayra Reel, MD [facility] [phone] 10:32 AM

## 2015-02-15 NOTE — Transfer of Care (Signed)
Immediate Anesthesia Transfer of Care Note  Patient: Brian Zavala  Procedure(s) Performed: Procedure(s): LEFT INDEX FINGER AND RIGHT MIDDLE FINGER NODULE EXCISION (Bilateral)  Patient Location: PACU  Anesthesia Type:General  Level of Consciousness: awake and patient cooperative  Airway & Oxygen Therapy: Patient Spontanous Breathing and Patient connected to face mask oxygen  Post-op Assessment: Report given to RN and Post -op Vital signs reviewed and stable  Post vital signs: Reviewed and stable  Last Vitals:  Filed Vitals:   02/15/15 0906  BP: 120/78  Pulse: 86  Temp: 36.7 C  Resp: 16    Complications: No apparent anesthesia complications

## 2015-02-15 NOTE — Discharge Instructions (Signed)
Postoperative instructions: ° °Weightbearing: as tolerated ° °Keep your dressing and/or splint clean and dry at all times.  You can remove your dressing on post-operative day #3 and change with a dry/sterile dressing or Band-Aids as needed thereafter.   ° °Incision instructions:  Do not soak your incision for 3 weeks after surgery.  If the incision gets wet, pat dry and do not scrub the incision. ° °Pain control:  You have been given a prescription to be taken as directed for post-operative pain control.  In addition, elevate the operative extremity above the heart at all times to prevent swelling and throbbing pain. ° °Take over-the-counter Colace, 100mg by mouth twice a day while taking narcotic pain medications to help prevent constipation. ° °Follow up appointments: °1) 10-14 days for suture removal and wound check. °2) Dr. Xu as scheduled. ° ° ------------------------------------------------------------------------------------------------------------- ° °After Surgery Pain Control: ° °After your surgery, post-surgical discomfort or pain is likely. This discomfort can last several days to a few weeks. At certain times of the day your discomfort may be more intense.  °Did you receive a nerve block?  °A nerve block can provide pain relief for one hour to two days after your surgery. As long as the nerve block is working, you will experience little or no sensation in the area the surgeon operated on.  °As the nerve block wears off, you will begin to experience pain or discomfort. It is very important that you begin taking your prescribed pain medication before the nerve block fully wears off. Treating your pain at the first sign of the block wearing off will ensure your pain is better controlled and more tolerable when full-sensation returns. Do not wait until the pain is intolerable, as the medicine will be less effective. It is better to treat pain in advance than to try and catch up.  °General Anesthesia:  °If  you did not receive a nerve block during your surgery, you will need to start taking your pain medication shortly after your surgery and should continue to do so as prescribed by your surgeon.  °Pain Medication:  °Most commonly we prescribe Vicodin and Percocet for post-operative pain. Both of these medications contain a combination of acetaminophen (Tylenol®) and a narcotic to help control pain.  °· It takes between 30 and 45 minutes before pain medication starts to work. It is important to take your medication before your pain level gets too intense.  °· Nausea is a common side effect of many pain medications. You will want to eat something before taking your pain medicine to help prevent nausea.  °· If you are taking a prescription pain medication that contains acetaminophen, we recommend that you do not take additional over the counter acetaminophen (Tylenol®).  °Other pain relieving options:  °· Using a cold pack to ice the affected area a few times a day (15 to 20 minutes at a time) can help to relieve pain, reduce swelling and bruising.  °· Elevation of the affected area can also help to reduce pain and swelling. ° ° ° ° ° °Post Anesthesia Home Care Instructions ° °Activity: °Get plenty of rest for the remainder of the day. A responsible adult should stay with you for 24 hours following the procedure.  °For the next 24 hours, DO NOT: °-Drive a car °-Operate machinery °-Drink alcoholic beverages °-Take any medication unless instructed by your physician °-Make any legal decisions or sign important papers. ° °Meals: °Start with liquid foods such as gelatin or   soup. Progress to regular foods as tolerated. Avoid greasy, spicy, heavy foods. If nausea and/or vomiting occur, drink only clear liquids until the nausea and/or vomiting subsides. Call your physician if vomiting continues. ° °Special Instructions/Symptoms: °Your throat may feel dry or sore from the anesthesia or the breathing tube placed in your throat  during surgery. If this causes discomfort, gargle with warm salt water. The discomfort should disappear within 24 hours. ° °If you had a scopolamine patch placed behind your ear for the management of post- operative nausea and/or vomiting: ° °1. The medication in the patch is effective for 72 hours, after which it should be removed.  Wrap patch in a tissue and discard in the trash. Wash hands thoroughly with soap and water. °2. You may remove the patch earlier than 72 hours if you experience unpleasant side effects which may include dry mouth, dizziness or visual disturbances. °3. Avoid touching the patch. Wash your hands with soap and water after contact with the patch. °  ° ° °

## 2015-02-15 NOTE — Anesthesia Preprocedure Evaluation (Addendum)
Anesthesia Evaluation  Patient identified by MRN, date of birth, ID band Patient awake    Reviewed: Allergy & Precautions, NPO status , Patient's Chart, lab work & pertinent test results  Airway Mallampati: I  TM Distance: >3 FB Neck ROM: Full    Dental   Pulmonary COPD, Current Smoker,    Pulmonary exam normal        Cardiovascular hypertension, Pt. on medications Normal cardiovascular exam     Neuro/Psych  Neuromuscular disease    GI/Hepatic   Endo/Other  diabetes, Type 1, Insulin Dependent  Renal/GU      Musculoskeletal  (+) Arthritis ,   Abdominal   Peds  Hematology   Anesthesia Other Findings   Reproductive/Obstetrics                            Anesthesia Physical Anesthesia Plan  ASA: II  Anesthesia Plan: General   Post-op Pain Management:    Induction: Intravenous  Airway Management Planned: LMA  Additional Equipment:   Intra-op Plan:   Post-operative Plan: Extubation in OR  Informed Consent: I have reviewed the patients History and Physical, chart, labs and discussed the procedure including the risks, benefits and alternatives for the proposed anesthesia with the patient or authorized representative who has indicated his/her understanding and acceptance.     Plan Discussed with: CRNA and Surgeon  Anesthesia Plan Comments:         Anesthesia Quick Evaluation

## 2015-02-15 NOTE — H&P (Signed)
PREOPERATIVE H&P  Chief Complaint: left index finger rheumatoid nodule, right long finger rheumatoid nodule  HPI: Brian Zavala is a 64 y.o. male who presents for surgical treatment of left index finger rheumatoid nodule, right long finger rheumatoid nodule.  He denies any changes in medical history.  Past Medical History  Diagnosis Date  . Hypertension   . Neuropathy (HCC)   . Hyperlipidemia   . Neuromuscular disorder (HCC)   . Wears glasses   . Wears partial dentures     top and bottom partials  . COPD (chronic obstructive pulmonary disease) (HCC)   . Type II diabetes mellitus (HCC)   . Arthritis     "pretty much all over"   Past Surgical History  Procedure Laterality Date  . Leg amputation below knee Right 2005  . Lumbar disc surgery  82,90    ruptured disc  . Toe amputation Right 2005  . Foot amputation Right 2005  . Scar revision Right 2005    @ amputation  . Cyst excision perineal N/A 12/08/2012    Procedure: CYST EXCISION PERINeum;  Surgeon: Maisie Fus A. Cornett, MD;  Location: [facility];  Service: General;  Laterality: N/A;  . Cholecystectomy N/A 10/26/2013    Procedure: LAPAROSCOPIC CHOLECYSTECTOMY WITH INTRAOPERATIVE CHOLANGIOGRAM;  Surgeon: Clovis Pu. Cornett, MD;  Location: [facility] OR;  Service: General;  Laterality: N/A;  . Incise and drain abcess  12/02/2014    PERINEAL ABSCESS  . Back surgery    . Incision and drainage perirectal abscess Left 12/02/2014    Procedure: IRRIGATION AND DEBRIDEMENT PERINEAL ABSCESS;  Surgeon: Abigail Miyamoto, MD;  Location: [facility] OR;  Service: General;  Laterality: Left;   Social History   Social History  . Marital Status: Divorced    Spouse Name: N/A  . Number of Children: N/A  . Years of Education: N/A   Social History Main Topics  . Smoking status: Current Every Day Smoker -- 1.00 packs/day for 47 years    Types: Cigarettes  . Smokeless tobacco: Never Used  . Alcohol Use: No  . Drug Use: No  . Sexual  Activity: No   Other Topics Concern  . None   Social History Narrative   History reviewed. No pertinent family history. No Known Allergies Prior to Admission medications   Medication Sig Start Date End Date Taking? Authorizing Provider  aspirin 81 MG tablet Take 81 mg by mouth daily.   Yes Historical Provider, MD  atorvastatin (LIPITOR) 10 MG tablet Take 10 mg by mouth daily at 6 PM.   Yes Historical Provider, MD  Calcium Carbonate-Vitamin D (CALCIUM + D PO) Take 1 tablet by mouth every evening.    Yes Historical Provider, MD  cholecalciferol (VITAMIN D) 1000 UNITS tablet Take 1,000 Units by mouth daily.   Yes Historical Provider, MD  docusate sodium (COLACE) 100 MG capsule Take 100 mg by mouth 2 (two) times daily.   Yes Historical Provider, MD  etanercept (ENBREL) 50 MG/ML injection Inject 50 mg into the skin every 7 (seven) days. Wednesdays   Yes Historical Provider, MD  hydrochlorothiazide (MICROZIDE) 12.5 MG capsule Take 12.5 mg by mouth daily.   Yes Historical Provider, MD  insulin glargine (LANTUS) 100 UNIT/ML injection Inject 60 Units into the skin every evening.    Yes Historical Provider, MD  leflunomide (ARAVA) 10 MG tablet Take 10 mg by mouth daily.   Yes Historical Provider, MD  Liraglutide (VICTOZA) 18 MG/3ML SOLN injection Inject 1.8 mg into the  skin every evening.    Yes Historical Provider, MD  morphine (MS CONTIN) 100 MG 12 hr tablet Take 100 mg by mouth 2 (two) times daily.   Yes Historical Provider, MD  predniSONE (DELTASONE) 1 MG tablet Take 2 mg by mouth daily with breakfast.   Yes Historical Provider, MD  pregabalin (LYRICA) 75 MG capsule Take 75 mg by mouth 2 (two) times daily.   Yes Historical Provider, MD     Positive ROS: All other systems have been reviewed and were otherwise negative with the exception of those mentioned in the HPI and as above.  Physical Exam: General: Alert, no acute distress Cardiovascular: No pedal edema Respiratory: No cyanosis, no use  of accessory musculature GI: abdomen soft Skin: No lesions in the area of chief complaint Neurologic: Sensation intact distally Psychiatric: Patient is competent for consent with normal mood and affect Lymphatic: no lymphedema  MUSCULOSKELETAL: exam stable  Assessment: left index finger rheumatoid nodule, right long finger rheumatoid nodule  Plan: Plan for Procedure(s): LEFT INDEX FINGER AND RIGHT MIDDLE FINGER NODULE EXCISION  The risks benefits and alternatives were discussed with the patient including but not limited to the risks of nonoperative treatment, versus surgical intervention including infection, bleeding, nerve injury,  blood clots, cardiopulmonary complications, morbidity, mortality, among others, and they were willing to proceed.   Cheral Almas, MD   02/15/2015 7:16 AM

## 2015-02-15 NOTE — Anesthesia Postprocedure Evaluation (Signed)
Anesthesia Post Note  Patient: Brian Zavala  Procedure(s) Performed: Procedure(s) (LRB): LEFT INDEX FINGER AND RIGHT MIDDLE FINGER NODULE EXCISION (Bilateral)  Patient location during evaluation: PACU Anesthesia Type: General Level of consciousness: awake and alert Pain management: pain level controlled Vital Signs Assessment: post-procedure vital signs reviewed and stable Respiratory status: spontaneous breathing, nonlabored ventilation, respiratory function stable and patient connected to nasal cannula oxygen Cardiovascular status: blood pressure returned to baseline and stable Postop Assessment: no signs of nausea or vomiting Anesthetic complications: no    Last Vitals:  Filed Vitals:   02/15/15 1120 02/15/15 1124  BP:  155/85  Pulse: 89 92  Temp:  36.4 C  Resp: 16 16    Last Pain:  Filed Vitals:   02/15/15 1129  PainSc: 2                  OSSEY,KEVIN Curt

## 2015-02-15 NOTE — Anesthesia Procedure Notes (Signed)
Procedure Name: LMA Insertion Date/Time: 02/15/2015 9:55 AM Performed by: BLOCKER, TIMOTHY D Pre-anesthesia Checklist: Patient identified, Emergency Drugs available, Suction available and Patient being monitored Patient Re-evaluated:Patient Re-evaluated prior to inductionOxygen Delivery Method: Circle System Utilized Preoxygenation: Pre-oxygenation with 100% oxygen Intubation Type: IV induction Ventilation: Mask ventilation without difficulty LMA: LMA inserted LMA Size: 4.0 Number of attempts: 1 Airway Equipment and Method: Bite block Placement Confirmation: positive ETCO2 Tube secured with: Tape Dental Injury: Teeth and Oropharynx as per pre-operative assessment

## 2015-02-16 ENCOUNTER — Encounter (HOSPITAL_BASED_OUTPATIENT_CLINIC_OR_DEPARTMENT_OTHER): Payer: Self-pay | Admitting: Orthopaedic Surgery

## 2015-08-07 ENCOUNTER — Encounter: Payer: Self-pay | Admitting: Gastroenterology

## 2015-12-08 ENCOUNTER — Other Ambulatory Visit (INDEPENDENT_AMBULATORY_CARE_PROVIDER_SITE_OTHER): Payer: Self-pay | Admitting: Orthopaedic Surgery

## 2015-12-08 DIAGNOSIS — M06341 Rheumatoid nodule, right hand: Secondary | ICD-10-CM | POA: Diagnosis not present

## 2015-12-22 ENCOUNTER — Inpatient Hospital Stay (INDEPENDENT_AMBULATORY_CARE_PROVIDER_SITE_OTHER): Payer: Medicare (Managed Care) | Admitting: Orthopaedic Surgery

## 2015-12-22 DIAGNOSIS — M06341 Rheumatoid nodule, right hand: Secondary | ICD-10-CM

## 2016-08-27 ENCOUNTER — Ambulatory Visit (INDEPENDENT_AMBULATORY_CARE_PROVIDER_SITE_OTHER): Payer: Medicare (Managed Care) | Admitting: Orthopaedic Surgery

## 2016-09-16 ENCOUNTER — Ambulatory Visit (INDEPENDENT_AMBULATORY_CARE_PROVIDER_SITE_OTHER): Payer: Medicare (Managed Care) | Admitting: Orthopaedic Surgery

## 2016-12-06 ENCOUNTER — Ambulatory Visit (INDEPENDENT_AMBULATORY_CARE_PROVIDER_SITE_OTHER): Payer: Medicare (Managed Care) | Admitting: Family

## 2016-12-06 DIAGNOSIS — L97212 Non-pressure chronic ulcer of right calf with fat layer exposed: Secondary | ICD-10-CM | POA: Diagnosis not present

## 2016-12-06 DIAGNOSIS — S88111S Complete traumatic amputation at level between knee and ankle, right lower leg, sequela: Secondary | ICD-10-CM

## 2016-12-06 MED ORDER — DOXYCYCLINE HYCLATE 100 MG PO TABS: 100.0000 mg | ORAL_TABLET | Freq: Two times a day (BID) | ORAL | 0 refills | Status: DC

## 2016-12-06 NOTE — Progress Notes (Signed)
Office Visit Note   Patient: Brian Zavala           Date of Birth: February 18, 1951           MRN: [id number] Visit Date: 12/06/2016              Requested by: Jethro Bastos, MD [address] [place], [place] [address] PCP: Jethro Bastos, MD  Chief Complaint  Patient presents with  . Right Knee - Wound Check      HPI: The patient is a 66 year old gentleman seen today for evaluation of ulceration to his right below the knee amputation. States his surgery was back in 2006 this required several revisions. Has had no issues until about a year and a half ago has had recurrent ulcerations over the distal fibula. Him currently ulcer but has been present for about 6 weeks without any improvement. Has been increasingly tender to touch. He's been applying Medi honey and dressings daily. Is using a motorized scooter staying out of his prosthesis.  Assessment & Plan: Visit Diagnoses:  1. 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 Discussed with the patient that he may need further revision surgery to remove distal fibula bone. We'll begin with conservative measures. Patient is in agreement with the plan. We will provide an prescription for doxycycline. Follow-up in 2 weeks. Cleanse daily apply mupirocin dressings. Discussed the importance of continuing to not weight-bear on the right.  Follow-Up Instructions: Return in about 2 weeks (around 12/19/2016).   Ortho Exam  Patient is alert, oriented, no adenopathy, well-dressed, normal affect, normal respiratory effort. On examination of the right below the knee amputation laterally over the distal fibula there is a 6 mm in diameter ulceration this is 2 mm deep filled in with 60% exudative tissue. Can debride back to bleeding granulation tissue. This does not probe to bone. There is surrounding erythema and tenderness no purulence no odor  no ascending cellulitis.  Imaging: No results found. No images are attached to the encounter.  Labs: Lab Results  Component Value Date   HGBA1C 9.4 (H) 12/03/2014   REPTSTATUS 12/06/2014 FINAL 12/02/2014   REPTSTATUS 12/08/2014 FINAL 12/02/2014   GRAMSTAIN  12/02/2014    MODERATE WBC PRESENT, PREDOMINANTLY PMN NO SQUAMOUS EPITHELIAL CELLS SEEN MODERATE YEAST ABUNDANT GRAM POSITIVE COCCI IN PAIRS    GRAMSTAIN  12/02/2014    MODERATE WBC PRESENT, PREDOMINANTLY PMN NO SQUAMOUS EPITHELIAL CELLS SEEN MODERATE YEAST ABUNDANT GRAM POSITIVE COCCI IN PAIRS    CULT  12/02/2014    MODERATE DIPHTHEROIDS(CORYNEBACTERIUM SPECIES) Note: Standardized susceptibility testing for this organism is not available. Performed at [facility]    CULT  12/02/2014    NO ANAEROBES ISOLATED Performed at [facility]     Orders:  No orders of the defined types were placed in this encounter.  No orders of the defined types were placed in this encounter.    Procedures: No procedures performed  Clinical Data: No additional findings.  ROS:  All other systems negative, except as noted in the HPI. Review of Systems  Constitutional: Negative for chills and fever.  Skin: Positive for color change and wound.    Objective: Vital Signs: There were no vitals taken for this visit.  Specialty Comments:  No specialty comments available.  PMFS History: Patient Active Problem List   Diagnosis Date Noted  . Diabetes mellitus, insulin  dependent (IDDM), controlled (HCC)   . Type 2 diabetes mellitus with diabetic neuropathy, with long-term current use of insulin (HCC)   . S/P unilateral BKA (below knee amputation) (HCC)   . Rheumatoid arthritis (HCC)   . Perineal abscess 12/02/2014  . Abscess 12/02/2014  . Gallstones 09/23/2013  . Non-intractable cyclical vomiting with nausea 09/23/2013  . Post-operative state 12/21/2012  . Cellulitis of perineum 11/30/2012  . Epidermal  inclusion cyst 11/30/2012   Past Medical History:  Diagnosis Date  . Arthritis    "pretty much all over"  . COPD (chronic obstructive pulmonary disease) (HCC)   . Hyperlipidemia   . Hypertension   . Neuromuscular disorder (HCC)   . Neuropathy (HCC)   . Type II diabetes mellitus (HCC)   . Wears glasses   . Wears partial dentures    top and bottom partials    No family history on file.  Past Surgical History:  Procedure Laterality Date  . BACK SURGERY    . CHOLECYSTECTOMY N/A 10/26/2013   Procedure: LAPAROSCOPIC CHOLECYSTECTOMY WITH INTRAOPERATIVE CHOLANGIOGRAM;  Surgeon: Clovis Pu. Cornett, MD;  Location: [facility] OR;  Service: General;  Laterality: N/A;  . CYST EXCISION Bilateral 02/15/2015   Procedure: LEFT INDEX FINGER AND RIGHT MIDDLE FINGER NODULE EXCISION;  Surgeon: Tarry Kos, MD;  Location: [facility];  Service: Orthopedics;  Laterality: Bilateral;  . CYST EXCISION PERINEAL N/A 12/08/2012   Procedure: CYST EXCISION PERINeum;  Surgeon: Maisie Fus A. Cornett, MD;  Location: [facility];  Service: General;  Laterality: N/A;  . FOOT AMPUTATION Right 2005  . INCISE AND DRAIN ABCESS  12/02/2014   PERINEAL ABSCESS  . INCISION AND DRAINAGE PERIRECTAL ABSCESS Left 12/02/2014   Procedure: IRRIGATION AND DEBRIDEMENT PERINEAL ABSCESS;  Surgeon: Abigail Miyamoto, MD;  Location: [facility];  Service: General;  Laterality: Left;  . LEG AMPUTATION BELOW KNEE Right 2005  . LUMBAR DISC SURGERY  82,90   ruptured disc  . SCAR REVISION Right 2005   @ amputation  . TOE AMPUTATION Right 2005   Social History   Occupational History  . Not on file.   Social History Main Topics  . Smoking status: Current Every Day Smoker    Packs/day: 1.00    Years: 47.00    Types: Cigarettes  . Smokeless tobacco: Never Used  . Alcohol use No  . Drug use: No  . Sexual activity: No

## 2016-12-10 ENCOUNTER — Encounter (HOSPITAL_COMMUNITY): Payer: Self-pay

## 2016-12-10 ENCOUNTER — Emergency Department (HOSPITAL_COMMUNITY): Payer: Medicare (Managed Care)

## 2016-12-10 ENCOUNTER — Emergency Department (HOSPITAL_COMMUNITY)
Admission: EM | Admit: 2016-12-10 | Discharge: 2016-12-10 | Disposition: A | Discharge status: Home / Self Care | Payer: Medicare (Managed Care) | Attending: Emergency Medicine | Admitting: Emergency Medicine

## 2016-12-10 DIAGNOSIS — W268XXA Contact with other sharp object(s), not elsewhere classified, initial encounter: Secondary | ICD-10-CM | POA: Insufficient documentation

## 2016-12-10 DIAGNOSIS — I10 Essential (primary) hypertension: Secondary | ICD-10-CM | POA: Insufficient documentation

## 2016-12-10 DIAGNOSIS — J449 Chronic obstructive pulmonary disease, unspecified: Secondary | ICD-10-CM | POA: Diagnosis not present

## 2016-12-10 DIAGNOSIS — S61412A Laceration without foreign body of left hand, initial encounter: Secondary | ICD-10-CM | POA: Insufficient documentation

## 2016-12-10 DIAGNOSIS — Z794 Long term (current) use of insulin: Secondary | ICD-10-CM | POA: Diagnosis not present

## 2016-12-10 DIAGNOSIS — Y999 Unspecified external cause status: Secondary | ICD-10-CM | POA: Insufficient documentation

## 2016-12-10 DIAGNOSIS — F17210 Nicotine dependence, cigarettes, uncomplicated: Secondary | ICD-10-CM | POA: Insufficient documentation

## 2016-12-10 DIAGNOSIS — Z79899 Other long term (current) drug therapy: Secondary | ICD-10-CM | POA: Diagnosis not present

## 2016-12-10 DIAGNOSIS — E1140 Type 2 diabetes mellitus with diabetic neuropathy, unspecified: Secondary | ICD-10-CM | POA: Diagnosis not present

## 2016-12-10 DIAGNOSIS — Z7982 Long term (current) use of aspirin: Secondary | ICD-10-CM | POA: Diagnosis not present

## 2016-12-10 DIAGNOSIS — Y92009 Unspecified place in unspecified non-institutional (private) residence as the place of occurrence of the external cause: Secondary | ICD-10-CM | POA: Diagnosis not present

## 2016-12-10 DIAGNOSIS — Y939 Activity, unspecified: Secondary | ICD-10-CM | POA: Insufficient documentation

## 2016-12-10 MED ORDER — LIDOCAINE HCL (PF) 1 % IJ SOLN
30.0000 mL | Freq: Once | INTRAMUSCULAR | Status: AC
  Administered 2016-12-10: 30 mL
  Filled 2016-12-10: qty 30

## 2016-12-10 MED ORDER — OXYCODONE-ACETAMINOPHEN 5-325 MG PO TABS
ORAL_TABLET | ORAL | Status: AC
  Filled 2016-12-10: qty 1

## 2016-12-10 MED ORDER — OXYCODONE-ACETAMINOPHEN 5-325 MG PO TABS
1.0000 | ORAL_TABLET | ORAL | Status: DC | PRN
  Administered 2016-12-10: 1 via ORAL

## 2016-12-10 NOTE — ED Triage Notes (Signed)
Per pT, Pt is coming from home with complaints of laceration noted to the left hand. Pt has a approximately two inch laceration between his fourth and fifth finger on the left side. Reports to be getting out of his wheelchair when he slipped and his hand hit something on the wheelchair. Bleeding controlled. No change in ability to use hand. Pulses intact.

## 2016-12-10 NOTE — ED Notes (Signed)
Would cleaned with wound cleanser patient tolerated without incident.

## 2016-12-10 NOTE — ED Provider Notes (Signed)
[facility]-EMERGENCY DEPT Provider Note   CSN: [id number] Arrival date & time: 12/10/16  8657     History   Chief Complaint Chief Complaint  Patient presents with  . Extremity Laceration    HPI Brian Zavala is a 66 y.o. male.  HPI Patient presents to ED for evaluation of laceration that approximately 3 hours ago on the left hand. He was getting out of his wheelchair when he accidentally cut it on a metal object. Unknown when last tetanus was. He reports bleeding has been controlled with dressing. He denies any blood thinner use.  Past Medical History:  Diagnosis Date  . Arthritis    "pretty much all over"  . COPD (chronic obstructive pulmonary disease) (HCC)   . Hyperlipidemia   . Hypertension   . Neuromuscular disorder (HCC)   . Neuropathy   . Type II diabetes mellitus (HCC)   . Wears glasses   . Wears partial dentures    top and bottom partials    Patient Active Problem List   Diagnosis Date Noted  . Diabetes mellitus, insulin dependent (IDDM), controlled (HCC)   . Type 2 diabetes mellitus with diabetic neuropathy, with long-term current use of insulin (HCC)   . S/P unilateral BKA (below knee amputation) (HCC)   . Rheumatoid arthritis (HCC)   . Perineal abscess 12/02/2014  . Abscess 12/02/2014  . Gallstones 09/23/2013  . Non-intractable cyclical vomiting with nausea 09/23/2013  . Post-operative state 12/21/2012  . Cellulitis of perineum 11/30/2012  . Epidermal inclusion cyst 11/30/2012    Past Surgical History:  Procedure Laterality Date  . BACK SURGERY    . CHOLECYSTECTOMY N/A 10/26/2013   Procedure: LAPAROSCOPIC CHOLECYSTECTOMY WITH INTRAOPERATIVE CHOLANGIOGRAM;  Surgeon: Clovis Pu. Cornett, MD;  Location: [facility];  Service: General;  Laterality: N/A;  . CYST EXCISION Bilateral 02/15/2015   Procedure: LEFT INDEX FINGER AND RIGHT MIDDLE FINGER NODULE EXCISION;  Surgeon: Tarry Kos, MD;  Location: [facility];  Service: Orthopedics;  Laterality:  Bilateral;  . CYST EXCISION PERINEAL N/A 12/08/2012   Procedure: CYST EXCISION PERINeum;  Surgeon: Maisie Fus A. Cornett, MD;  Location: [facility];  Service: General;  Laterality: N/A;  . FOOT AMPUTATION Right 2005  . INCISE AND DRAIN ABCESS  12/02/2014   PERINEAL ABSCESS  . INCISION AND DRAINAGE PERIRECTAL ABSCESS Left 12/02/2014   Procedure: IRRIGATION AND DEBRIDEMENT PERINEAL ABSCESS;  Surgeon: Abigail Miyamoto, MD;  Location: [facility];  Service: General;  Laterality: Left;  . LEG AMPUTATION BELOW KNEE Right 2005  . LUMBAR DISC SURGERY  82,90   ruptured disc  . SCAR REVISION Right 2005   @ amputation  . TOE AMPUTATION Right 2005       Home Medications    Prior to Admission medications   Medication Sig Start Date End Date Taking? Authorizing Provider  aspirin 81 MG tablet Take 81 mg by mouth daily.    [provider]  atorvastatin (LIPITOR) 10 MG tablet Take 10 mg by mouth daily at 6 PM.    [provider]  Calcium Carbonate-Vitamin D (CALCIUM + D PO) Take 1 tablet by mouth every evening.     [provider]  cholecalciferol (VITAMIN D) 1000 UNITS tablet Take 1,000 Units by mouth daily.    [provider]  docusate sodium (COLACE) 100 MG capsule Take 100 mg by mouth 2 (two) times daily.    [provider]  doxycycline (VIBRA-TABS) 100 MG tablet Take 1 tablet (100 mg total) by  mouth 2 (two) times daily. 12/06/16   Adonis Huguenin, NP  etanercept (ENBREL) 50 MG/ML injection Inject 50 mg into the skin every 7 (seven) days. Wednesdays    [provider]  hydrochlorothiazide (MICROZIDE) 12.5 MG capsule Take 12.5 mg by mouth daily.    [provider]  HYDROcodone-acetaminophen (NORCO) 5-325 MG tablet Take 1-2 tablets by mouth every 6 (six) hours as needed. 02/15/15   Tarry Kos, MD  insulin glargine (LANTUS) 100 UNIT/ML injection Inject 60 Units into the skin every evening.     [provider]  leflunomide  (ARAVA) 10 MG tablet Take 10 mg by mouth daily.    [provider]  Liraglutide (VICTOZA) 18 MG/3ML SOLN injection Inject 1.8 mg into the skin every evening.     [provider]  morphine (MS CONTIN) 100 MG 12 hr tablet Take 100 mg by mouth 2 (two) times daily.    [provider]  predniSONE (DELTASONE) 1 MG tablet Take 2 mg by mouth daily with breakfast.    [provider]  pregabalin (LYRICA) 75 MG capsule Take 75 mg by mouth 2 (two) times daily.    [provider]    Family History No family history on file.  Social History Social History  Substance Use Topics  . Smoking status: Current Every Day Smoker    Packs/day: 1.00    Years: 47.00    Types: Cigarettes  . Smokeless tobacco: Never Used  . Alcohol use No     Allergies   Varenicline   Review of Systems Review of Systems  Constitutional: Negative for chills and fever.  Gastrointestinal: Negative for nausea and vomiting.  Skin: Positive for wound.  Neurological: Negative for weakness and numbness.     Physical Exam Updated Vital Signs BP (!) 145/82 (BP Location: Right Arm)   Pulse 89   Temp 98.6 F (37 C) (Oral)   Resp 18   Ht 6\' 1"  (1.854 m)   Wt 86.2 kg (190 lb)   SpO2 98%   BMI 25.07 kg/m   Physical Exam  Constitutional: He appears well-developed and well-nourished. No distress.  HENT:  Head: Normocephalic and atraumatic.  Eyes: Conjunctivae and EOM are normal. No scleral icterus.  Neck: Normal range of motion.  Pulmonary/Chest: Effort normal. No respiratory distress.  Neurological: He is alert.  Skin: Laceration noted. No rash noted. He is not diaphoretic.  Approximately 5cm laceration between the left fourth and fifth digits. This laceration wraps around the MCP joint of the fifth digit. No bleeding noted at this time. Full active and passive range of motion of the digits. Sensation intact to light touch.  Psychiatric: He has a normal mood and affect.    Nursing note and vitals reviewed.    ED Treatments / Results  Labs (all labs ordered are listed, but only abnormal results are displayed) Labs Reviewed - No data to display  EKG  EKG Interpretation None       Radiology Dg Hand Complete Left  Result Date: 12/10/2016 CLINICAL DATA:  Fall.  Laceration between fourth and fifth digits EXAM: LEFT HAND - COMPLETE 3+ VIEW COMPARISON:  None. FINDINGS: No acute bony abnormality. Specifically, no fracture, subluxation, or dislocation. Soft tissues are intact. Advanced arthritic changes noted in the wrist. IMPRESSION: No acute bony abnormality. Electronically Signed   By: Charlett Nose M.D.   On: 12/10/2016 09:02    Procedures .Marland KitchenLaceration Repair Date/Time: 12/10/2016 11:29 AM Performed by: Dietrich Pates Authorized by:  KHATRI, HINA   Consent:    Consent obtained:  Verbal   Consent given by:  Patient   Risks discussed:  Infection, pain, poor cosmetic result and need for additional repair Anesthesia (see MAR for exact dosages):    Anesthesia method:  Local infiltration   Local anesthetic:  Lidocaine 1% w/o epi Laceration details:    Location:  Hand   Hand location:  L palm   Length (cm):  5 Repair type:    Repair type:  Simple Exploration:    Hemostasis achieved with:  Direct pressure   Wound exploration: wound explored through full range of motion   Treatment:    Area cleansed with:  Saline and Hibiclens   Amount of cleaning:  Extensive   Irrigation solution:  Sterile saline   Irrigation method:  Syringe Skin repair:    Repair method:  Sutures   Suture size:  5-0   Wound skin closure material used: ethilon.   Suture technique:  Simple interrupted   Number of sutures:  12 Approximation:    Approximation:  Close Post-procedure details:    Dressing:  Antibiotic ointment and splint for protection   Patient tolerance of procedure:  Tolerated well, no immediate complications   (including critical care time)  Medications  Ordered in ED Medications  oxyCODONE-acetaminophen (PERCOCET/ROXICET) 5-325 MG per tablet 1 tablet (1 tablet Oral Given 12/10/16 0839)  oxyCODONE-acetaminophen (PERCOCET/ROXICET) 5-325 MG per tablet (not administered)  lidocaine (PF) (XYLOCAINE) 1 % injection 30 mL (30 mLs Infiltration Given 12/10/16 1117)     Initial Impression / Assessment and Plan / ED Course  I have reviewed the triage vital signs and the nursing notes.  Pertinent labs & imaging results that were available during my care of the patient were reviewed by me and considered in my medical decision making (see chart for details).     Patient presents to ED for evaluation of hand laceration that occurred just prior to arrival. He does have a approximately 5 cm laceration that goes from the palm of the hand to the dorsum of the hand between the left fourth and fifth digits. Bleeding is controlled. He has full active and passive range of motion of the digits and sensation intact to light touch. There is fatty tissue noted. Area was closed with sutures. Last tetanus was 4 years ago according to chart review. Patient is guarding on doxycycline for a knee wound infection. I encouraged him to continue taking this as directed and to return appropriate time for suture removal. Due to the location of the laceration will buddy tape in place and splint. Educational materials provided on wound care. Patient appears stable for discharge at this time. Strict return precautions given.  Final Clinical Impressions(s) / ED Diagnoses   Final diagnoses:  Laceration of left hand without foreign body, initial encounter    New Prescriptions New Prescriptions   No medications on file     Dietrich Pates, PA-C 12/10/16 1132    Tilden Fossa, MD 12/11/16 [phone]

## 2016-12-10 NOTE — Discharge Instructions (Signed)
Please read attached information regarding your condition and wound care. Return for suture removal in 10-12 days. You can return to any emergency room, urgent care or primary care's office. Continue taking antibiotics as previously prescribed. Return to ED sooner for any signs of infection including drainage from site, bleeding, redness of the joints, fevers.

## 2016-12-10 NOTE — Progress Notes (Signed)
Orthopedic Tech Progress Note Patient Details:  CLEARANCE CHENAULT 09-03-1950 [id number]  Ortho Devices Type of Ortho Device: Finger splint Ortho Device/Splint Location: lue Ortho Device/Splint Interventions: Application   Crawford, Rembert 12/10/2016, 11:58 AM

## 2016-12-10 NOTE — Progress Notes (Signed)
Orthopedic Tech Progress Note Patient Details:  Brian Zavala 1950/08/10 [id number]  Patient ID: Brian Zavala, male   DOB: [birth year], 66 y.o.   MRN: [id number]   [name] 12/10/2016, 11:58 AM Delete one ortho tech visit

## 2016-12-23 ENCOUNTER — Ambulatory Visit (INDEPENDENT_AMBULATORY_CARE_PROVIDER_SITE_OTHER): Payer: Medicare (Managed Care) | Admitting: Orthopedic Surgery

## 2016-12-23 ENCOUNTER — Encounter (INDEPENDENT_AMBULATORY_CARE_PROVIDER_SITE_OTHER): Payer: Self-pay | Admitting: Orthopedic Surgery

## 2016-12-23 DIAGNOSIS — S88111S Complete traumatic amputation at level between knee and ankle, right lower leg, sequela: Secondary | ICD-10-CM | POA: Diagnosis not present

## 2016-12-23 DIAGNOSIS — S61412A Laceration without foreign body of left hand, initial encounter: Secondary | ICD-10-CM

## 2016-12-23 NOTE — Progress Notes (Signed)
Office Visit Note   Patient: Brian Zavala           Date of Birth: 11-24-1950           MRN: [id number] Visit Date: 12/23/2016              Requested by: Jethro Bastos, MD [address] [place], [place] [address] PCP: Jethro Bastos, MD  Chief Complaint  Patient presents with  . Right Leg - Wound Check      HPI: Patient presents for 2 separate issues.  #1 ulceration distal fibula right transtibial amputation #2 laceration fourth webspace left hand between the fourth and fifth fingers.  Patient states that he fell out of his wheelchair he is unsure how this happened he states he sustained a laceration went to the emergency room and had this sutured.  Patient is 2 weeks out from the laceration.  Assessment & Plan: Visit Diagnoses:  1. 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 Laceration of left hand without foreign body, initial encounter     Plan: Continue Bactroban dressing changes to the left hand between the middle and ring finger.  Sutures harvested today buddy tape the fingers with a Bactroban dressing change daily.  Right transtibial amputation continue Bactroban dressing changes continue to not use the prosthesis.  Follow-Up Instructions: Return in about 2 weeks (around 01/06/2017).   Ortho Exam  Patient is alert, oriented, no adenopathy, well-dressed, normal affect, normal respiratory effort. Examination patient ambulates in a wheelchair.  The ulcer over the distal fibula on the right is approximately 5 mm in diameter and 1 mm deep there is fibrinous tissue at the base there is no cellulitis no drainage no odor no signs of infection.  Examination of the left hand patient has some gaping open approximately millimeter of the surgical incision.  There is no purulence no abscess no cellulitis no signs of infection.  We will harvest the sutures.  Imaging: No results found. No images are attached to the encounter.  Labs: Lab  Results  Component Value Date   HGBA1C 9.4 (H) 12/03/2014   REPTSTATUS 12/06/2014 FINAL 12/02/2014   REPTSTATUS 12/08/2014 FINAL 12/02/2014   GRAMSTAIN  12/02/2014    MODERATE WBC PRESENT, PREDOMINANTLY PMN NO SQUAMOUS EPITHELIAL CELLS SEEN MODERATE YEAST ABUNDANT GRAM POSITIVE COCCI IN PAIRS    GRAMSTAIN  12/02/2014    MODERATE WBC PRESENT, PREDOMINANTLY PMN NO SQUAMOUS EPITHELIAL CELLS SEEN MODERATE YEAST ABUNDANT GRAM POSITIVE COCCI IN PAIRS    CULT  12/02/2014    MODERATE DIPHTHEROIDS(CORYNEBACTERIUM SPECIES) Note: Standardized susceptibility testing for this organism is not available. Performed at [facility]    CULT  12/02/2014    NO ANAEROBES ISOLATED Performed at [facility]     Orders:  No orders of the defined types were placed in this encounter.  No orders of the defined types were placed in this encounter.    Procedures: No procedures performed  Clinical Data: No additional findings.  ROS:  All other systems negative, except as noted in the HPI. Review of Systems  Objective: Vital Signs: There were no vitals taken for this visit.  Specialty Comments:  No specialty comments available.  PMFS History: Patient Active Problem List   Diagnosis Date Noted  . Diabetes mellitus, insulin dependent (IDDM), controlled (HCC)   . Type 2 diabetes mellitus with diabetic neuropathy, with long-term current use of insulin (HCC)   . S/P unilateral BKA (below  knee amputation) (HCC)   . Rheumatoid arthritis (HCC)   . Perineal abscess 12/02/2014  . Abscess 12/02/2014  . Gallstones 09/23/2013  . Non-intractable cyclical vomiting with nausea 09/23/2013  . Post-operative state 12/21/2012  . Cellulitis of perineum 11/30/2012  . Epidermal inclusion cyst 11/30/2012   Past Medical History:  Diagnosis Date  . Arthritis    "pretty much all over"  . COPD (chronic obstructive pulmonary disease) (HCC)   . Hyperlipidemia   . Hypertension   .  Neuromuscular disorder (HCC)   . Neuropathy   . Type II diabetes mellitus (HCC)   . Wears glasses   . Wears partial dentures    top and bottom partials    No family history on file.  Past Surgical History:  Procedure Laterality Date  . BACK SURGERY    . CHOLECYSTECTOMY N/A 10/26/2013   Procedure: LAPAROSCOPIC CHOLECYSTECTOMY WITH INTRAOPERATIVE CHOLANGIOGRAM;  Surgeon: Clovis Pu. Cornett, MD;  Location: [facility];  Service: General;  Laterality: N/A;  . CYST EXCISION Bilateral 02/15/2015   Procedure: LEFT INDEX FINGER AND RIGHT MIDDLE FINGER NODULE EXCISION;  Surgeon: Tarry Kos, MD;  Location: [facility];  Service: Orthopedics;  Laterality: Bilateral;  . CYST EXCISION PERINEAL N/A 12/08/2012   Procedure: CYST EXCISION PERINeum;  Surgeon: Maisie Fus A. Cornett, MD;  Location: [facility];  Service: General;  Laterality: N/A;  . FOOT AMPUTATION Right 2005  . INCISE AND DRAIN ABCESS  12/02/2014   PERINEAL ABSCESS  . INCISION AND DRAINAGE PERIRECTAL ABSCESS Left 12/02/2014   Procedure: IRRIGATION AND DEBRIDEMENT PERINEAL ABSCESS;  Surgeon: Abigail Miyamoto, MD;  Location: [facility];  Service: General;  Laterality: Left;  . LEG AMPUTATION BELOW KNEE Right 2005  . LUMBAR DISC SURGERY  82,90   ruptured disc  . SCAR REVISION Right 2005   @ amputation  . TOE AMPUTATION Right 2005   Social History   Occupational History  . Not on file.   Social History Main Topics  . Smoking status: Current Every Day Smoker    Packs/day: 1.00    Years: 47.00    Types: Cigarettes  . Smokeless tobacco: Never Used  . Alcohol use No  . Drug use: No  . Sexual activity: No

## 2017-01-06 ENCOUNTER — Encounter (INDEPENDENT_AMBULATORY_CARE_PROVIDER_SITE_OTHER): Payer: Self-pay | Admitting: Orthopedic Surgery

## 2017-01-06 ENCOUNTER — Ambulatory Visit (INDEPENDENT_AMBULATORY_CARE_PROVIDER_SITE_OTHER): Payer: Medicare (Managed Care) | Admitting: Orthopedic Surgery

## 2017-01-06 VITALS — Ht 73.0 in | Wt 190.0 lb

## 2017-01-06 DIAGNOSIS — S88111S Complete traumatic amputation at level between knee and ankle, right lower leg, sequela: Secondary | ICD-10-CM

## 2017-01-06 DIAGNOSIS — S61412A Laceration without foreign body of left hand, initial encounter: Secondary | ICD-10-CM | POA: Diagnosis not present

## 2017-01-06 NOTE — Progress Notes (Signed)
Office Visit Note   Patient: Brian Zavala           Date of Birth: 1950-05-30           MRN: [id number] Visit Date: 01/06/2017              Requested by: Jethro Bastos, MD [address] [place], [place] [address] PCP: Jethro Bastos, MD  Chief Complaint  Patient presents with  . Left Hand - Follow-up  . Right Leg - Follow-up    BKA ulceration      HPI: Patient is a 66 year old gentleman status post right transtibial amputation and laceration in the fourth webspace left hand.  Patient states he has been keeping his fingers taped together.  Assessment & Plan: Visit Diagnoses:  1. 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 Laceration of left hand without foreign body, initial encounter     Plan: Recommended continued antibiotic ointment dressing changes to the right transtibial amputation ulcer.  Continue with the stump shrinker and continue with not using his prosthesis on the right.  Left fourth webspace no additional treatment necessary.  Follow-Up Instructions: Return in about 4 weeks (around 02/03/2017).   Ortho Exam  Patient is alert, oriented, no adenopathy, well-dressed, normal affect, normal respiratory effort. Examination patient has no swelling cellulitis or drainage to the right leg.  The ulcer is 3 mm in diameter 1 mm deep with fibrinous tissue at the base.  This continues to show slow improvement.  Left hand fourth webspace has essentially completely healed.  Imaging: No results found. No images are attached to the encounter.  Labs: Lab Results  Component Value Date   HGBA1C 9.4 (H) 12/03/2014   REPTSTATUS 12/06/2014 FINAL 12/02/2014   REPTSTATUS 12/08/2014 FINAL 12/02/2014   GRAMSTAIN  12/02/2014    MODERATE WBC PRESENT, PREDOMINANTLY PMN NO SQUAMOUS EPITHELIAL CELLS SEEN MODERATE YEAST ABUNDANT GRAM POSITIVE COCCI IN PAIRS    GRAMSTAIN  12/02/2014    MODERATE WBC PRESENT, PREDOMINANTLY PMN NO SQUAMOUS  EPITHELIAL CELLS SEEN MODERATE YEAST ABUNDANT GRAM POSITIVE COCCI IN PAIRS    CULT  12/02/2014    MODERATE DIPHTHEROIDS(CORYNEBACTERIUM SPECIES) Note: Standardized susceptibility testing for this organism is not available. Performed at [facility]    CULT  12/02/2014    NO ANAEROBES ISOLATED Performed at [facility]     Orders:  No orders of the defined types were placed in this encounter.  No orders of the defined types were placed in this encounter.    Procedures: No procedures performed  Clinical Data: No additional findings.  ROS:  All other systems negative, except as noted in the HPI. Review of Systems  Objective: Vital Signs: Ht 6\' 1"  (1.854 m)   Wt 190 lb (86.2 kg)   BMI 25.07 kg/m   Specialty Comments:  No specialty comments available.  PMFS History: Patient Active Problem List   Diagnosis Date Noted  . Diabetes mellitus, insulin dependent (IDDM), controlled (HCC)   . Type 2 diabetes mellitus with diabetic neuropathy, with long-term current use of insulin (HCC)   . S/P unilateral BKA (below knee amputation) (HCC)   . Rheumatoid arthritis (HCC)   . Perineal abscess 12/02/2014  . Abscess 12/02/2014  . Gallstones 09/23/2013  . Non-intractable cyclical vomiting with nausea 09/23/2013  . Post-operative state 12/21/2012  . Cellulitis of perineum 11/30/2012  . Epidermal inclusion cyst 11/30/2012   Past Medical History:  Diagnosis Date  . Arthritis    "  pretty much all over"  . COPD (chronic obstructive pulmonary disease) (HCC)   . Hyperlipidemia   . Hypertension   . Neuromuscular disorder (HCC)   . Neuropathy   . Type II diabetes mellitus (HCC)   . Wears glasses   . Wears partial dentures    top and bottom partials    History reviewed. No pertinent family history.  Past Surgical History:  Procedure Laterality Date  . BACK SURGERY    . FOOT AMPUTATION Right 2005  . INCISE AND DRAIN ABCESS  12/02/2014   PERINEAL ABSCESS  .  LEG AMPUTATION BELOW KNEE Right 2005  . LUMBAR DISC SURGERY  82,90   ruptured disc  . SCAR REVISION Right 2005   @ amputation  . TOE AMPUTATION Right 2005   Social History   Occupational History  . Not on file  Tobacco Use  . Smoking status: Current Every Day Smoker    Packs/day: 1.00    Years: 47.00    Pack years: 47.00    Types: Cigarettes  . Smokeless tobacco: Never Used  Substance and Sexual Activity  . Alcohol use: No  . Drug use: No  . Sexual activity: No

## 2017-01-22 ENCOUNTER — Telehealth (INDEPENDENT_AMBULATORY_CARE_PROVIDER_SITE_OTHER): Payer: Self-pay | Admitting: Orthopedic Surgery

## 2017-01-22 NOTE — Telephone Encounter (Signed)
Brian Zavala,Brian Zavala August 19, 1950       Pt nurse called and would like pt to be seen earlier than 02/03/17.Pt is not healing as well as they thought.

## 2017-01-22 NOTE — Telephone Encounter (Signed)
I called and spoke with Annice Pih advised that patient could come see Erin on Wednesday morning. She is going to check to see if it is ok with patient. Advised whomever answers the phone can schedule that appointment. Dr. Lajoyce Corners is already over booked for Monday, Tuesday, Wednesday, and Thursday and is in surgery on Friday and on hospital call all weekend.

## 2017-02-03 ENCOUNTER — Ambulatory Visit (INDEPENDENT_AMBULATORY_CARE_PROVIDER_SITE_OTHER): Payer: Medicare (Managed Care) | Admitting: Orthopedic Surgery

## 2017-02-03 ENCOUNTER — Encounter (INDEPENDENT_AMBULATORY_CARE_PROVIDER_SITE_OTHER): Payer: Self-pay | Admitting: Orthopedic Surgery

## 2017-02-03 DIAGNOSIS — S88111S Complete traumatic amputation at level between knee and ankle, right lower leg, sequela: Secondary | ICD-10-CM | POA: Diagnosis not present

## 2017-02-03 DIAGNOSIS — L97211 Non-pressure chronic ulcer of right calf limited to breakdown of skin: Secondary | ICD-10-CM | POA: Diagnosis not present

## 2017-02-03 DIAGNOSIS — L97214 Non-pressure chronic ulcer of right calf with necrosis of bone: Secondary | ICD-10-CM | POA: Insufficient documentation

## 2017-02-03 NOTE — Progress Notes (Signed)
Office Visit Note   Patient: Brian Zavala           Date of Birth: 04-24-1950           MRN: [id number] Visit Date: 02/03/2017              Requested by: Jethro Bastos, MD [address] [place], [place] [address] PCP: Jethro Bastos, MD  Chief Complaint  Patient presents with  . Right Leg - Open Wound    Right lateral residual limb ulceration      HPI: Patient is seen in follow-up for right transtibial amputation.  He is currently undergoing electrical stimulation at [facility].  Patient is using Bactroban ointment over the ulcer.  Assessment & Plan: Visit Diagnoses:  1. 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 Non-pressure chronic ulcer of right calf, limited to breakdown of skin (HCC)     Plan: Recommended continue Bactroban dressing changes continue with his stump shrinker.  We will reevaluate in 4 weeks to see if we need to do further debridement.  Follow-Up Instructions: Return in about 4 weeks (around 03/03/2017).   Ortho Exam  Patient is alert, oriented, no adenopathy, well-dressed, normal affect, normal respiratory effort. Examination scooter.  He has small fibrinous tissue over the ulcer over the fibular head right transtibial amputation.  The N bearing ulcer has healed.  After informed consent a 10 blade knife was used to debrided back to bleeding viable granulation tissue a Band-Aid and Iodosorb was applied.  The wound is 5 mm in diameter 2 mm deep.  There is no cellulitis no exposed bone no drainage no tenderness to palpation.  Imaging: No results found. No images are attached to the encounter.  Labs: Lab Results  Component Value Date   HGBA1C 9.4 (H) 12/03/2014   REPTSTATUS 12/06/2014 FINAL 12/02/2014   REPTSTATUS 12/08/2014 FINAL 12/02/2014   GRAMSTAIN  12/02/2014    MODERATE WBC PRESENT, PREDOMINANTLY PMN NO SQUAMOUS EPITHELIAL CELLS SEEN MODERATE YEAST ABUNDANT GRAM POSITIVE COCCI IN PAIRS    GRAMSTAIN  12/02/2014    MODERATE WBC PRESENT, PREDOMINANTLY PMN NO SQUAMOUS EPITHELIAL CELLS SEEN MODERATE YEAST ABUNDANT GRAM POSITIVE COCCI IN PAIRS    CULT  12/02/2014    MODERATE DIPHTHEROIDS(CORYNEBACTERIUM SPECIES) Note: Standardized susceptibility testing for this organism is not available. Performed at [facility]    CULT  12/02/2014    NO ANAEROBES ISOLATED Performed at [facility]     @LABSALLVALUES (HGBA1)@  There is no height or weight on file to calculate BMI.  Orders:  No orders of the defined types were placed in this encounter.  No orders of the defined types were placed in this encounter.    Procedures: No procedures performed  Clinical Data: No additional findings.  ROS:  All other systems negative, except as noted in the HPI. Review of Systems  Objective: Vital Signs: There were no vitals taken for this visit.  Specialty Comments:  No specialty comments available.  PMFS History: Patient Active Problem List   Diagnosis Date Noted  . 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 Non-pressure chronic ulcer of right calf, limited to breakdown of skin (HCC) 02/03/2017  . Diabetes mellitus, insulin dependent (IDDM), controlled (HCC)   . Type 2 diabetes mellitus with diabetic neuropathy, with long-term current use of insulin (HCC)   . S/P unilateral BKA (below knee amputation) (HCC)   . Rheumatoid  arthritis (HCC)   . Perineal abscess 12/02/2014  . Abscess 12/02/2014  . Gallstones 09/23/2013  . Non-intractable cyclical vomiting with nausea 09/23/2013  . Post-operative state 12/21/2012  . Cellulitis of perineum 11/30/2012  . Epidermal inclusion cyst 11/30/2012   Past Medical History:  Diagnosis Date  . Arthritis    "pretty much all over"  . COPD (chronic obstructive pulmonary disease) (HCC)   . Hyperlipidemia   . Hypertension   . Neuromuscular disorder (HCC)   . Neuropathy   .  Type II diabetes mellitus (HCC)   . Wears glasses   . Wears partial dentures    top and bottom partials    History reviewed. No pertinent family history.  Past Surgical History:  Procedure Laterality Date  . BACK SURGERY    . CHOLECYSTECTOMY N/A 10/26/2013   Procedure: LAPAROSCOPIC CHOLECYSTECTOMY WITH INTRAOPERATIVE CHOLANGIOGRAM;  Surgeon: Clovis Pu. Cornett, MD;  Location: [facility];  Service: General;  Laterality: N/A;  . CYST EXCISION Bilateral 02/15/2015   Procedure: LEFT INDEX FINGER AND RIGHT MIDDLE FINGER NODULE EXCISION;  Surgeon: Tarry Kos, MD;  Location: [facility];  Service: Orthopedics;  Laterality: Bilateral;  . CYST EXCISION PERINEAL N/A 12/08/2012   Procedure: CYST EXCISION PERINeum;  Surgeon: Maisie Fus A. Cornett, MD;  Location: [facility];  Service: General;  Laterality: N/A;  . FOOT AMPUTATION Right 2005  . INCISE AND DRAIN ABCESS  12/02/2014   PERINEAL ABSCESS  . INCISION AND DRAINAGE PERIRECTAL ABSCESS Left 12/02/2014   Procedure: IRRIGATION AND DEBRIDEMENT PERINEAL ABSCESS;  Surgeon: Abigail Miyamoto, MD;  Location: [facility];  Service: General;  Laterality: Left;  . LEG AMPUTATION BELOW KNEE Right 2005  . LUMBAR DISC SURGERY  82,90   ruptured disc  . SCAR REVISION Right 2005   @ amputation  . TOE AMPUTATION Right 2005   Social History   Occupational History  . Not on file  Tobacco Use  . Smoking status: Current Every Day Smoker    Packs/day: 1.00    Years: 47.00    Pack years: 47.00    Types: Cigarettes  . Smokeless tobacco: Never Used  Substance and Sexual Activity  . Alcohol use: No  . Drug use: No  . Sexual activity: No

## 2017-02-20 ENCOUNTER — Encounter (INDEPENDENT_AMBULATORY_CARE_PROVIDER_SITE_OTHER): Payer: Self-pay | Admitting: Orthopedic Surgery

## 2017-02-20 ENCOUNTER — Ambulatory Visit (INDEPENDENT_AMBULATORY_CARE_PROVIDER_SITE_OTHER): Payer: Medicare (Managed Care) | Admitting: Orthopedic Surgery

## 2017-02-20 DIAGNOSIS — S88111S Complete traumatic amputation at level between knee and ankle, right lower leg, sequela: Secondary | ICD-10-CM

## 2017-02-20 DIAGNOSIS — L97211 Non-pressure chronic ulcer of right calf limited to breakdown of skin: Secondary | ICD-10-CM | POA: Diagnosis not present

## 2017-02-20 MED ORDER — PENTOXIFYLLINE ER 400 MG PO TBCR: 400.0000 mg | EXTENDED_RELEASE_TABLET | Freq: Three times a day (TID) | ORAL | 3 refills | Status: DC

## 2017-02-20 NOTE — Progress Notes (Signed)
Office Visit Note   Patient: Brian Zavala           Date of Birth: 1950/04/25           MRN: [id number] Visit Date: 02/20/2017              Requested by: Jethro Bastos, MD [address] [place], [place] [address] PCP: Jethro Bastos, MD  Chief Complaint  Patient presents with  . Right Leg - Open Wound      HPI: Patient is a 67 year old gentleman who states that he has had the ulcer over the right fibular head approximately 6 months.  He is currently wearing an over-the-counter stump shrinker has been doing Bactroban dressing changes.  Patient states he has not had any improvement.  Assessment & Plan: Visit Diagnoses:  1. 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 Non-pressure chronic ulcer of right calf, limited to breakdown of skin (HCC)     Plan: We will start him on Trental Howell to try to improve the microcirculation a prescription was provided.  Patient will obtain a medical compression stocking from [employer] to help with the healing.  We will follow-up in 2-3 weeks.  Discussed that if this does not heal with nonoperative treatment we could proceed with surgical debridement and closure of the wound.  Follow-Up Instructions: Return in about 2 weeks (around 03/06/2017).   Ortho Exam  Patient is alert, oriented, no adenopathy, well-dressed, normal affect, normal respiratory effort. Examination patient has good hair growth in the leg the previous amputation is healed well.  He has an ulcer over the fibular head.  After informed consent a 15 blade knife was used to debride the fibrinous tissue back to healthy tissue.  The wound is 5 mm in diameter and 3 mm deep.  This does not probe to bone.  This was packed open with Iodosorb gauze and a Band-Aid.  Imaging: No results found. No images are attached to the encounter.  Labs: Lab Results  Component Value Date   HGBA1C 9.4 (H) 12/03/2014   REPTSTATUS 12/06/2014 FINAL 12/02/2014   REPTSTATUS 12/08/2014 FINAL 12/02/2014   GRAMSTAIN  12/02/2014    MODERATE WBC PRESENT, PREDOMINANTLY PMN NO SQUAMOUS EPITHELIAL CELLS SEEN MODERATE YEAST ABUNDANT GRAM POSITIVE COCCI IN PAIRS    GRAMSTAIN  12/02/2014    MODERATE WBC PRESENT, PREDOMINANTLY PMN NO SQUAMOUS EPITHELIAL CELLS SEEN MODERATE YEAST ABUNDANT GRAM POSITIVE COCCI IN PAIRS    CULT  12/02/2014    MODERATE DIPHTHEROIDS(CORYNEBACTERIUM SPECIES) Note: Standardized susceptibility testing for this organism is not available. Performed at [facility]    CULT  12/02/2014    NO ANAEROBES ISOLATED Performed at [facility]     @LABSALLVALUES (HGBA1)@  There is no height or weight on file to calculate BMI.  Orders:  No orders of the defined types were placed in this encounter.  No orders of the defined types were placed in this encounter.    Procedures: No procedures performed  Clinical Data: No additional findings.  ROS:  All other systems negative, except as noted in the HPI. Review of Systems  Objective: Vital Signs: There were no vitals taken for this visit.  Specialty Comments:  No specialty comments available.  PMFS History: Patient Active Problem List   Diagnosis Date Noted  . 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 Non-pressure chronic ulcer of right calf, limited to breakdown of skin (HCC)  02/03/2017  . Diabetes mellitus, insulin dependent (IDDM), controlled (HCC)   . Type 2 diabetes mellitus with diabetic neuropathy, with long-term current use of insulin (HCC)   . S/P unilateral BKA (below knee amputation) (HCC)   . Rheumatoid arthritis (HCC)   . Perineal abscess 12/02/2014  . Abscess 12/02/2014  . Gallstones 09/23/2013  . Non-intractable cyclical vomiting with nausea 09/23/2013  . Post-operative state 12/21/2012  . Cellulitis of perineum 11/30/2012  . Epidermal inclusion cyst 11/30/2012   Past Medical History:    Diagnosis Date  . Arthritis    "pretty much all over"  . COPD (chronic obstructive pulmonary disease) (HCC)   . Hyperlipidemia   . Hypertension   . Neuromuscular disorder (HCC)   . Neuropathy   . Type II diabetes mellitus (HCC)   . Wears glasses   . Wears partial dentures    top and bottom partials    History reviewed. No pertinent family history.  Past Surgical History:  Procedure Laterality Date  . BACK SURGERY    . CHOLECYSTECTOMY N/A 10/26/2013   Procedure: LAPAROSCOPIC CHOLECYSTECTOMY WITH INTRAOPERATIVE CHOLANGIOGRAM;  Surgeon: Clovis Pu. Cornett, MD;  Location: [facility];  Service: General;  Laterality: N/A;  . CYST EXCISION Bilateral 02/15/2015   Procedure: LEFT INDEX FINGER AND RIGHT MIDDLE FINGER NODULE EXCISION;  Surgeon: Tarry Kos, MD;  Location: [facility];  Service: Orthopedics;  Laterality: Bilateral;  . CYST EXCISION PERINEAL N/A 12/08/2012   Procedure: CYST EXCISION PERINeum;  Surgeon: Maisie Fus A. Cornett, MD;  Location: [facility];  Service: General;  Laterality: N/A;  . FOOT AMPUTATION Right 2005  . INCISE AND DRAIN ABCESS  12/02/2014   PERINEAL ABSCESS  . INCISION AND DRAINAGE PERIRECTAL ABSCESS Left 12/02/2014   Procedure: IRRIGATION AND DEBRIDEMENT PERINEAL ABSCESS;  Surgeon: Abigail Miyamoto, MD;  Location: [facility];  Service: General;  Laterality: Left;  . LEG AMPUTATION BELOW KNEE Right 2005  . LUMBAR DISC SURGERY  82,90   ruptured disc  . SCAR REVISION Right 2005   @ amputation  . TOE AMPUTATION Right 2005   Social History   Occupational History  . Not on file  Tobacco Use  . Smoking status: Current Every Day Smoker    Packs/day: 1.00    Years: 47.00    Pack years: 47.00    Types: Cigarettes  . Smokeless tobacco: Never Used  Substance and Sexual Activity  . Alcohol use: No  . Drug use: No  . Sexual activity: No

## 2017-03-06 ENCOUNTER — Other Ambulatory Visit: Payer: Self-pay

## 2017-03-06 ENCOUNTER — Encounter (INDEPENDENT_AMBULATORY_CARE_PROVIDER_SITE_OTHER): Payer: Self-pay | Admitting: Orthopedic Surgery

## 2017-03-06 ENCOUNTER — Other Ambulatory Visit (INDEPENDENT_AMBULATORY_CARE_PROVIDER_SITE_OTHER): Payer: Self-pay | Admitting: Family

## 2017-03-06 ENCOUNTER — Ambulatory Visit (INDEPENDENT_AMBULATORY_CARE_PROVIDER_SITE_OTHER): Payer: Medicare (Managed Care) | Admitting: Orthopedic Surgery

## 2017-03-06 ENCOUNTER — Encounter (HOSPITAL_COMMUNITY): Payer: Self-pay | Admitting: *Deleted

## 2017-03-06 ENCOUNTER — Telehealth (INDEPENDENT_AMBULATORY_CARE_PROVIDER_SITE_OTHER): Payer: Self-pay

## 2017-03-06 DIAGNOSIS — S88111S Complete traumatic amputation at level between knee and ankle, right lower leg, sequela: Secondary | ICD-10-CM

## 2017-03-06 DIAGNOSIS — L97214 Non-pressure chronic ulcer of right calf with necrosis of bone: Secondary | ICD-10-CM

## 2017-03-06 NOTE — Progress Notes (Signed)
Office Visit Note   Patient: Brian Zavala           Date of Birth: September 05, 1950           MRN: [id number] Visit Date: 03/06/2017              Requested by: Jethro Bastos, MD [address] [place], [place] [address] PCP: Jethro Bastos, MD  No chief complaint on file.     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  Patient is undergone pressure unloading debridement antibiotics topical wound care all without relief he presents with progressive necrosis at the base of the wound.  Assessment & Plan: Visit Diagnoses:  1. 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  Examination patient is ambulating  Follow-Up Instructions: Return in about 2 weeks (around 03/20/2017).   Ortho Exam  Patient is alert, oriented, no adenopathy, well-dressed, normal affect, normal respiratory effort. Examination the wound is approximately 1 cm diameter 5 mm deep there is necrotic tissue the base of the wound previously there is healthy granulation tissue.  There is no purulence no cellulitis no odor.  Imaging: No results found. No images are attached to the encounter.  Labs: Lab Results  Component Value Date   HGBA1C 9.4 (H) 12/03/2014   REPTSTATUS 12/06/2014 FINAL 12/02/2014   REPTSTATUS 12/08/2014 FINAL 12/02/2014   GRAMSTAIN  12/02/2014    MODERATE WBC PRESENT, PREDOMINANTLY PMN NO SQUAMOUS EPITHELIAL CELLS  SEEN MODERATE YEAST ABUNDANT GRAM POSITIVE COCCI IN PAIRS    GRAMSTAIN  12/02/2014    MODERATE WBC PRESENT, PREDOMINANTLY PMN NO SQUAMOUS EPITHELIAL CELLS SEEN MODERATE YEAST ABUNDANT GRAM POSITIVE COCCI IN PAIRS    CULT  12/02/2014    MODERATE DIPHTHEROIDS(CORYNEBACTERIUM SPECIES) Note: Standardized susceptibility testing for this organism is not available. Performed at [facility]    CULT  12/02/2014    NO ANAEROBES ISOLATED Performed at [facility]     @LABSALLVALUES (HGBA1)@  There is no height or weight on file to calculate BMI.  Orders:  No orders of the defined types were placed in this encounter.  No orders of the defined types were placed in this encounter.    Procedures: No procedures performed  Clinical Data: No additional findings.  ROS:  All other systems negative, except as noted in the HPI. Review of Systems  Objective: Vital Signs: There were no vitals taken for this visit.  Specialty Comments:  No specialty comments available.  PMFS History: Patient Active Problem List   Diagnosis Date Noted  . 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 Diabetes mellitus, insulin dependent (IDDM), controlled (HCC)   . Type 2 diabetes mellitus with diabetic neuropathy, with long-term current use of insulin (HCC)   . S/P unilateral BKA (below knee amputation) (HCC)   . Rheumatoid arthritis (HCC)   . Perineal  abscess 12/02/2014  . Abscess 12/02/2014  . Gallstones 09/23/2013  . Non-intractable cyclical vomiting with nausea 09/23/2013  . Post-operative state 12/21/2012  . Cellulitis of perineum 11/30/2012  . Epidermal inclusion cyst 11/30/2012   Past Medical History:  Diagnosis Date  . Arthritis    "pretty much all over"  . COPD (chronic obstructive pulmonary disease) (HCC)   . Hyperlipidemia   . Hypertension    . Neuromuscular disorder (HCC)   . Neuropathy   . Type II diabetes mellitus (HCC)   . Wears glasses   . Wears partial dentures    top and bottom partials    No family history on file.  Past Surgical History:  Procedure Laterality Date  . BACK SURGERY    . CHOLECYSTECTOMY N/A 10/26/2013   Procedure: LAPAROSCOPIC CHOLECYSTECTOMY WITH INTRAOPERATIVE CHOLANGIOGRAM;  Surgeon: Clovis Pu. Cornett, MD;  Location: [facility];  Service: General;  Laterality: N/A;  . CYST EXCISION Bilateral 02/15/2015   Procedure: LEFT INDEX FINGER AND RIGHT MIDDLE FINGER NODULE EXCISION;  Surgeon: Tarry Kos, MD;  Location: [facility];  Service: Orthopedics;  Laterality: Bilateral;  . CYST EXCISION PERINEAL N/A 12/08/2012   Procedure: CYST EXCISION PERINeum;  Surgeon: Maisie Fus A. Cornett, MD;  [facility];  Service: General;  Laterality: N/A;  . FOOT AMPUTATION Right 2005  . INCISE AND DRAIN ABCESS  12/02/2014   PERINEAL ABSCESS  . INCISION AND DRAINAGE PERIRECTAL ABSCESS Left 12/02/2014   Procedure: IRRIGATION AND DEBRIDEMENT PERINEAL ABSCESS;  Surgeon: Abigail Miyamoto, MD;  Location: [facility];  Service: General;  Laterality: Left;  . LEG AMPUTATION BELOW KNEE Right 2005  . LUMBAR DISC SURGERY  82,90   ruptured disc  . SCAR REVISION Right 2005   @ amputation  . TOE AMPUTATION Right 2005   Social History   Occupational History  . Not on file  Tobacco Use  . Smoking status: Current Every Day Smoker    Packs/day: 1.00    Years: 47.00    Pack years: 47.00    Types: Cigarettes  . Smokeless tobacco: Never Used  Substance and Sexual Activity  . Alcohol use: No  . Drug use: No  . Sexual activity: No

## 2017-03-06 NOTE — Telephone Encounter (Signed)
Partial fib head excision and wound closure

## 2017-03-06 NOTE — Progress Notes (Signed)
Spoke with pt for pre-op call. Pt denies cardiac history, chest pain or sob. Pt is a type 2 diabetic. He thinks his A1C was 7.4 3 months ago. He states he does not check his blood sugar at home, not sure where his meter is. Pt instructed to take 1/2 of his regular dose of Lantus this evening, will take 30 units. Pt voiced understanding.

## 2017-03-06 NOTE — Telephone Encounter (Signed)
Pt was seen in the office today and is sch for revision BKA partial excision fibular head and he is concerned that you will remove too much of his limb that he will not be able to use his prosthetic. He wants a call back about this please tonight.

## 2017-03-06 NOTE — Telephone Encounter (Signed)
Called pt and advised very small piece of bone and skin removal where the ulcer is and closure. He will still be able to use his prosthetic. Pt voiced understanding no other questions.

## 2017-03-07 ENCOUNTER — Encounter (HOSPITAL_COMMUNITY)
Admission: RE | Disposition: A | Discharge status: Home / Self Care | Payer: Self-pay | Source: Ambulatory Visit | Attending: Orthopedic Surgery

## 2017-03-07 ENCOUNTER — Ambulatory Visit (HOSPITAL_COMMUNITY): Payer: Medicare (Managed Care) | Admitting: Certified Registered Nurse Anesthetist

## 2017-03-07 ENCOUNTER — Encounter (HOSPITAL_COMMUNITY): Payer: Self-pay | Admitting: Certified Registered Nurse Anesthetist

## 2017-03-07 ENCOUNTER — Ambulatory Visit (HOSPITAL_COMMUNITY)
Admission: RE | Admit: 2017-03-07 | Discharge: 2017-03-07 | Disposition: A | Discharge status: Home / Self Care | Payer: Medicare (Managed Care) | Source: Ambulatory Visit | Attending: Orthopedic Surgery | Admitting: Orthopedic Surgery

## 2017-03-07 ENCOUNTER — Other Ambulatory Visit: Payer: Self-pay

## 2017-03-07 DIAGNOSIS — Z794 Long term (current) use of insulin: Secondary | ICD-10-CM | POA: Diagnosis not present

## 2017-03-07 DIAGNOSIS — F17210 Nicotine dependence, cigarettes, uncomplicated: Secondary | ICD-10-CM | POA: Diagnosis not present

## 2017-03-07 DIAGNOSIS — E785 Hyperlipidemia, unspecified: Secondary | ICD-10-CM | POA: Insufficient documentation

## 2017-03-07 DIAGNOSIS — L97811 Non-pressure chronic ulcer of other part of right lower leg limited to breakdown of skin: Secondary | ICD-10-CM | POA: Insufficient documentation

## 2017-03-07 DIAGNOSIS — J449 Chronic obstructive pulmonary disease, unspecified: Secondary | ICD-10-CM | POA: Insufficient documentation

## 2017-03-07 DIAGNOSIS — M86261 Subacute osteomyelitis, right tibia and fibula: Secondary | ICD-10-CM | POA: Diagnosis not present

## 2017-03-07 DIAGNOSIS — Z7982 Long term (current) use of aspirin: Secondary | ICD-10-CM | POA: Insufficient documentation

## 2017-03-07 DIAGNOSIS — I10 Essential (primary) hypertension: Secondary | ICD-10-CM | POA: Diagnosis not present

## 2017-03-07 DIAGNOSIS — E1140 Type 2 diabetes mellitus with diabetic neuropathy, unspecified: Secondary | ICD-10-CM | POA: Diagnosis not present

## 2017-03-07 DIAGNOSIS — M868X6 Other osteomyelitis, lower leg: Secondary | ICD-10-CM | POA: Insufficient documentation

## 2017-03-07 DIAGNOSIS — E11622 Type 2 diabetes mellitus with other skin ulcer: Secondary | ICD-10-CM | POA: Diagnosis not present

## 2017-03-07 DIAGNOSIS — Z79899 Other long term (current) drug therapy: Secondary | ICD-10-CM | POA: Insufficient documentation

## 2017-03-07 DIAGNOSIS — Z89511 Acquired absence of right leg below knee: Secondary | ICD-10-CM | POA: Diagnosis not present

## 2017-03-07 DIAGNOSIS — E1169 Type 2 diabetes mellitus with other specified complication: Secondary | ICD-10-CM | POA: Diagnosis not present

## 2017-03-07 HISTORY — PX: I & D EXTREMITY: SHX5045

## 2017-03-07 LAB — BASIC METABOLIC PANEL
Anion gap: 12 (ref 5–15)
BUN: 7 mg/dL (ref 6–20)
CO2: 25 mmol/L (ref 22–32)
Calcium: 8.8 mg/dL — ABNORMAL LOW (ref 8.9–10.3)
Chloride: 96 mmol/L — ABNORMAL LOW (ref 101–111)
Creatinine, Ser: 0.95 mg/dL (ref 0.61–1.24)
GFR calc Af Amer: >60 mL/min (ref >60)
GFR calc non Af Amer: >60 mL/min (ref >60)
Glucose, Bld: 204 mg/dL — ABNORMAL HIGH (ref 65–99)
Potassium: 3.6 mmol/L (ref 3.5–5.1)
Sodium: 133 mmol/L — ABNORMAL LOW (ref 135–145)

## 2017-03-07 LAB — CBC
HCT: 43.6 % (ref 39.0–52.0)
Hemoglobin: 14.4 g/dL (ref 13.0–17.0)
MCH: 28.7 pg (ref 26.0–34.0)
MCHC: 33 g/dL (ref 30.0–36.0)
MCV: 87 fL (ref 78.0–100.0)
Platelets: 221 10*3/uL (ref 150–400)
RBC: 5.01 MIL/uL (ref 4.22–5.81)
RDW: 14 % (ref 11.5–15.5)
WBC: 5.8 10*3/uL (ref 4.0–10.5)

## 2017-03-07 LAB — HEMOGLOBIN A1C
Hgb A1c MFr Bld: 7.6 % — ABNORMAL HIGH (ref 4.8–5.6)
Mean Plasma Glucose: 171.42 mg/dL

## 2017-03-07 LAB — GLUCOSE, CAPILLARY
Glucose-Capillary: 239 mg/dL — ABNORMAL HIGH (ref 65–99)
Glucose-Capillary: 177 mg/dL — ABNORMAL HIGH (ref 65–99)
Glucose-Capillary: 168 mg/dL — ABNORMAL HIGH (ref 65–99)

## 2017-03-07 SURGERY — IRRIGATION AND DEBRIDEMENT EXTREMITY
Anesthesia: General | Site: Leg Lower | Laterality: Right

## 2017-03-07 MED ORDER — PHENYLEPHRINE 40 MCG/ML (10ML) SYRINGE FOR IV PUSH (FOR BLOOD PRESSURE SUPPORT)
PREFILLED_SYRINGE | INTRAVENOUS | Status: DC | PRN
  Administered 2017-03-07: 80 ug via INTRAVENOUS
  Administered 2017-03-07: 120 ug via INTRAVENOUS

## 2017-03-07 MED ORDER — DEXAMETHASONE SODIUM PHOSPHATE 10 MG/ML IJ SOLN
INTRAMUSCULAR | Status: AC
  Filled 2017-03-07: qty 1

## 2017-03-07 MED ORDER — DEXAMETHASONE SODIUM PHOSPHATE 10 MG/ML IJ SOLN
INTRAMUSCULAR | Status: DC | PRN
  Administered 2017-03-07: 5 mg via INTRAVENOUS

## 2017-03-07 MED ORDER — HYDROMORPHONE HCL 1 MG/ML IJ SOLN: 0.2500 mg | INTRAMUSCULAR | Status: DC | PRN

## 2017-03-07 MED ORDER — FENTANYL CITRATE (PF) 250 MCG/5ML IJ SOLN
INTRAMUSCULAR | Status: AC
  Filled 2017-03-07: qty 5

## 2017-03-07 MED ORDER — LIDOCAINE 2% (20 MG/ML) 5 ML SYRINGE
INTRAMUSCULAR | Status: DC | PRN
  Administered 2017-03-07: 30 mg via INTRAVENOUS

## 2017-03-07 MED ORDER — OXYCODONE-ACETAMINOPHEN 5-325 MG PO TABS: 1.0000 | ORAL_TABLET | ORAL | 0 refills | Status: DC | PRN

## 2017-03-07 MED ORDER — MIDAZOLAM HCL 2 MG/2ML IJ SOLN
INTRAMUSCULAR | Status: AC
  Filled 2017-03-07: qty 2

## 2017-03-07 MED ORDER — ONDANSETRON HCL 4 MG/2ML IJ SOLN
INTRAMUSCULAR | Status: AC
  Filled 2017-03-07: qty 2

## 2017-03-07 MED ORDER — PROMETHAZINE HCL 25 MG/ML IJ SOLN: 6.2500 mg | INTRAMUSCULAR | Status: DC | PRN

## 2017-03-07 MED ORDER — ONDANSETRON HCL 4 MG/2ML IJ SOLN
INTRAMUSCULAR | Status: DC | PRN
  Administered 2017-03-07: 4 mg via INTRAVENOUS

## 2017-03-07 MED ORDER — LIDOCAINE 2% (20 MG/ML) 5 ML SYRINGE
INTRAMUSCULAR | Status: AC
  Filled 2017-03-07: qty 5

## 2017-03-07 MED ORDER — CEFAZOLIN SODIUM-DEXTROSE 2-4 GM/100ML-% IV SOLN
2.0000 g | INTRAVENOUS | Status: AC
  Administered 2017-03-07: 2 g via INTRAVENOUS

## 2017-03-07 MED ORDER — 0.9 % SODIUM CHLORIDE (POUR BTL) OPTIME
TOPICAL | Status: DC | PRN
  Administered 2017-03-07: 1000 mL

## 2017-03-07 MED ORDER — CEFAZOLIN SODIUM-DEXTROSE 2-4 GM/100ML-% IV SOLN
INTRAVENOUS | Status: AC
  Filled 2017-03-07: qty 100

## 2017-03-07 MED ORDER — MIDAZOLAM HCL 5 MG/5ML IJ SOLN
INTRAMUSCULAR | Status: DC | PRN
  Administered 2017-03-07: 2 mg via INTRAVENOUS

## 2017-03-07 MED ORDER — MIDAZOLAM HCL 2 MG/2ML IJ SOLN: 0.5000 mg | Freq: Once | INTRAMUSCULAR | Status: DC | PRN

## 2017-03-07 MED ORDER — PROPOFOL 10 MG/ML IV BOLUS
INTRAVENOUS | Status: AC
  Filled 2017-03-07: qty 20

## 2017-03-07 MED ORDER — MEPERIDINE HCL 25 MG/ML IJ SOLN: 6.2500 mg | INTRAMUSCULAR | Status: DC | PRN

## 2017-03-07 MED ORDER — FENTANYL CITRATE (PF) 100 MCG/2ML IJ SOLN
INTRAMUSCULAR | Status: DC | PRN
  Administered 2017-03-07: 50 ug via INTRAVENOUS
  Administered 2017-03-07: 25 ug via INTRAVENOUS

## 2017-03-07 MED ORDER — LACTATED RINGERS IV SOLN
INTRAVENOUS | Status: DC
  Administered 2017-03-07: 09:00:00 via INTRAVENOUS

## 2017-03-07 MED ORDER — CHLORHEXIDINE GLUCONATE 4 % EX LIQD: 60.0000 mL | Freq: Once | CUTANEOUS | Status: DC

## 2017-03-07 MED ORDER — PROPOFOL 10 MG/ML IV BOLUS
INTRAVENOUS | Status: DC | PRN
  Administered 2017-03-07: 20 mg via INTRAVENOUS
  Administered 2017-03-07: 100 mg via INTRAVENOUS

## 2017-03-07 SURGICAL SUPPLY — 31 items
BLADE SURG 21 STRL SS (BLADE) ×2 IMPLANT
BNDG COHESIVE 6X5 TAN STRL LF (GAUZE/BANDAGES/DRESSINGS) ×2 IMPLANT
BNDG GAUZE ELAST 4 BULKY (GAUZE/BANDAGES/DRESSINGS) ×2 IMPLANT
COVER SURGICAL LIGHT HANDLE (MISCELLANEOUS) ×4 IMPLANT
DRAPE U-SHAPE 47X51 STRL (DRAPES) ×2 IMPLANT
DRSG ADAPTIC 3X8 NADH LF (GAUZE/BANDAGES/DRESSINGS) ×2 IMPLANT
DRSG PAD ABDOMINAL 8X10 ST (GAUZE/BANDAGES/DRESSINGS) ×2 IMPLANT
DURAPREP 26ML APPLICATOR (WOUND CARE) ×2 IMPLANT
ELECT REM PT RETURN 9FT ADLT (ELECTROSURGICAL)
ELECTRODE REM PT RTRN 9FT ADLT (ELECTROSURGICAL) IMPLANT
GAUZE SPONGE 4X4 12PLY STRL (GAUZE/BANDAGES/DRESSINGS) ×2 IMPLANT
GLOVE BIOGEL PI IND STRL 9 (GLOVE) ×1 IMPLANT
GLOVE BIOGEL PI INDICATOR 9 (GLOVE) ×1
GLOVE SURG ORTHO 9.0 STRL STRW (GLOVE) ×2 IMPLANT
GOWN STRL REUS W/ TWL XL LVL3 (GOWN DISPOSABLE) ×2 IMPLANT
GOWN STRL REUS W/TWL XL LVL3 (GOWN DISPOSABLE) ×2
HANDPIECE INTERPULSE COAX TIP (DISPOSABLE)
KIT BASIN OR (CUSTOM PROCEDURE TRAY) ×2 IMPLANT
KIT ROOM TURNOVER OR (KITS) ×2 IMPLANT
MANIFOLD NEPTUNE II (INSTRUMENTS) ×2 IMPLANT
NS IRRIG 1000ML POUR BTL (IV SOLUTION) ×2 IMPLANT
PACK ORTHO EXTREMITY (CUSTOM PROCEDURE TRAY) ×2 IMPLANT
PAD ARMBOARD 7.5X6 YLW CONV (MISCELLANEOUS) ×4 IMPLANT
SET HNDPC FAN SPRY TIP SCT (DISPOSABLE) IMPLANT
STOCKINETTE IMPERVIOUS 9X36 MD (GAUZE/BANDAGES/DRESSINGS) IMPLANT
SUT ETHILON 2 0 PSLX (SUTURE) ×4 IMPLANT
SWAB COLLECTION DEVICE MRSA (MISCELLANEOUS) ×2 IMPLANT
SWAB CULTURE ESWAB REG 1ML (MISCELLANEOUS) IMPLANT
TOWEL OR 17X26 10 PK STRL BLUE (TOWEL DISPOSABLE) ×2 IMPLANT
TUBE CONNECTING 12X1/4 (SUCTIONS) ×2 IMPLANT
YANKAUER SUCT BULB TIP NO VENT (SUCTIONS) ×2 IMPLANT

## 2017-03-07 NOTE — Op Note (Signed)
03/07/2017  11:30 AM  PATIENT:  Brian Zavala    PRE-OPERATIVE DIAGNOSIS:  Ulcer and Osteomyelitis Right Below Knee Amputation  POST-OPERATIVE DIAGNOSIS:  Same  PROCEDURE:  EXCISION FIBULAR HEAD RIGHT BELOW KNEE AMPUTATION, revision right transtibial amputation.  With local tissue rearrangement 20 x 40 mm.  SURGEON:  [name], MD  PHYSICIAN ASSISTANT:None ANESTHESIA:   General  PREOPERATIVE INDICATIONS:  Brian Zavala is a  67 y.o. male with a diagnosis of Ulcer and Osteomyelitis Right Below Knee Amputation who failed conservative measures and elected for surgical management.    The risks benefits and alternatives were discussed with the patient preoperatively including but not limited to the risks of infection, bleeding, nerve injury, cardiopulmonary complications, the need for revision surgery, among others, and the patient was willing to proceed.  OPERATIVE IMPLANTS: none  OPERATIVE FINDINGS: no abscess  OPERATIVE PROCEDURE: Patient was brought the operating room and underwent a general anesthetic.  After adequate levels of anesthesia were obtained patient's right lower extremity was prepped using DuraPrep draped into a sterile field a timeout was called.  Elliptical incision was made around the ulcerative tissue.  The ulcerative tissue and distal fibula was resected.  The fibula was resected back to bleeding viable healthy bone there is no abscess no cellulitis no purulence no signs of any deep infection.  The wound was irrigated with normal saline local tissue rearrangement was used to close the wound patient underwent a sterile compressive dressing he was extubated taken the PACU in stable condition plan to discharge to home Percocet follow-up in the office in 1 week for dressing change.   DISCHARGE PLANNING:  Antibiotic duration: Perioperatively.  Weightbearing: Nonweightbearing on the right.  Pain medication: Prescription provided for Percocet.  Dressing care/  Wound VAC: Leave dressing clean dry and intact until follow-up.  Ambulatory devices: Walker and wheelchair.  Discharge to: Home.  Follow-up: In the office 1 week post operative.

## 2017-03-07 NOTE — Anesthesia Procedure Notes (Signed)
Procedure Name: LMA Insertion Date/Time: 03/07/2017 11:15 AM Performed by: Pearson Grippe, CRNA Pre-anesthesia Checklist: Patient identified, Emergency Drugs available, Suction available and Patient being monitored Patient Re-evaluated:Patient Re-evaluated prior to induction Oxygen Delivery Method: Circle system utilized Preoxygenation: Pre-oxygenation with 100% oxygen Induction Type: IV induction Ventilation: Mask ventilation without difficulty LMA: LMA inserted LMA Size: 4.0 Number of attempts: 1 Airway Equipment and Method: Bite block Placement Confirmation: positive ETCO2 Tube secured with: Tape Dental Injury: Teeth and Oropharynx as per pre-operative assessment

## 2017-03-07 NOTE — H&P (Signed)
Brian Zavala is an 67 y.o. male.   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  Patient is undergone pressure unloading debridement antibiotics topical wound care all without relief he presents with progressive necrosis at the base of the wound.    Past Medical History:  Diagnosis Date  . Arthritis    "pretty much all over"  . COPD (chronic obstructive pulmonary disease) ([place])   . Hyperlipidemia   . Hypertension   . Neuromuscular disorder ([place])   . Neuropathy   . Type II diabetes mellitus ([place])   . Wears glasses   . Wears partial dentures    top and bottom partials    Past Surgical History:  Procedure Laterality Date  . BACK SURGERY    . CHOLECYSTECTOMY N/A 10/26/2013   Procedure: LAPAROSCOPIC CHOLECYSTECTOMY WITH INTRAOPERATIVE CHOLANGIOGRAM;  Surgeon: Joyice Faster. Cornett, MD;  Location: [facility];  Service: General;  Laterality: N/A;  . COLONOSCOPY    . CYST EXCISION Bilateral 02/15/2015   Procedure: LEFT INDEX FINGER AND RIGHT MIDDLE FINGER NODULE EXCISION;  Surgeon: Leandrew Koyanagi, MD;  Location: [facility];  Service: Orthopedics;  Laterality: Bilateral;  . CYST EXCISION PERINEAL N/A 12/08/2012   Procedure: CYST EXCISION PERINeum;  Surgeon: Marcello Moores A. Cornett, MD;  Location: [facility];  Service: General;  Laterality: N/A;  . FOOT AMPUTATION Right 2005  . INCISE AND DRAIN ABCESS  12/02/2014   PERINEAL ABSCESS  . INCISION AND DRAINAGE PERIRECTAL ABSCESS Left 12/02/2014   Procedure: IRRIGATION AND DEBRIDEMENT PERINEAL ABSCESS;  Surgeon: Coralie Keens, MD;  Location: [facility];  Service: General;  Laterality: Left;  . LEG AMPUTATION BELOW KNEE Right 2005  . LUMBAR DISC SURGERY  82,90   ruptured disc  . SCAR REVISION Right 2005   @  amputation  . TOE AMPUTATION Right 2005    Family History  Problem Relation Age of Onset  . Cirrhosis Father    Social History:  reports that he has been smoking cigarettes.  He has a 47.00 pack-year smoking history. he has never used smokeless tobacco. He reports that he does not drink alcohol or use drugs.  Allergies:  Allergies  Allergen Reactions  . Varenicline Other (See Comments)    Upset stomach    Medications Prior to Admission  Medication Sig Dispense Refill  . aspirin 81 MG chewable tablet Chew 81 mg by mouth daily.    Marland Kitchen atorvastatin (LIPITOR) 10 MG tablet Take 10 mg by mouth at bedtime.     . Calcium Carbonate-Vitamin D (CALCIUM + D PO) Take 1 tablet by mouth daily.     . cholecalciferol (VITAMIN D) 1000 UNITS tablet Take 1,000 Units by mouth daily.    Marland Kitchen docusate sodium (COLACE) 100 MG capsule Take 100 mg by mouth 2 (two) times daily.    Marland Kitchen etanercept (ENBREL) 50 MG/ML injection Inject 50 mg into the skin every 7 (seven) days. Wednesdays    . furosemide (LASIX) 20 MG tablet Take 20 mg by mouth daily.    Marland Kitchen glimepiride (AMARYL) 4 MG tablet Take 4 mg by mouth daily.    . hydrochlorothiazide (MICROZIDE) 12.5 MG capsule Take 12.5 mg by mouth daily.    . hydrocortisone 2.5 % cream Apply 1 application topically 2 (two) times daily.    Marland Kitchen ibuprofen (  ADVIL,MOTRIN) 200 MG tablet Take 200 mg by mouth every 4 (four) hours as needed for moderate pain.    Marland Kitchen insulin glargine (LANTUS) 100 UNIT/ML injection Inject 60 Units into the skin every evening.     . leflunomide (ARAVA) 20 MG tablet Take 20 mg by mouth daily.    . Liraglutide (VICTOZA) 18 MG/3ML SOLN injection Inject 1.8 mg into the skin every evening.     . metFORMIN (GLUCOPHAGE) 1000 MG tablet Take 1,000 mg by mouth 2 (two) times daily.    Marland Kitchen morphine (MS CONTIN) 100 MG 12 hr tablet Take 100 mg by mouth every 12 (twelve) hours.    . Nutritional Supplements (PROMOD) LIQD Take 30 mLs by mouth 2 (two) times daily.    . pentoxifylline  (TRENTAL) 400 MG CR tablet Take 1 tablet (400 mg total) by mouth 3 (three) times daily with meals. 90 tablet 3  . predniSONE (DELTASONE) 1 MG tablet Take 2 mg by mouth daily with breakfast.    . pregabalin (LYRICA) 75 MG capsule Take 75-150 mg by mouth See admin instructions. Take 75 mg in the morning and 150 mg in the evening    . Skin Protectants, Misc. (EUCERIN) cream Apply 1 application topically 2 (two) times daily.    . vitamin B-12 (CYANOCOBALAMIN) 100 MCG tablet Take 100 mcg by mouth daily.    . mupirocin ointment (BACTROBAN) 2 % Place 1 application into the nose 2 (two) times daily.    . Sodium Fluoride (PREVIDENT 5000 PLUS DT) Place 1 application onto teeth every evening.      Results for orders placed or performed during the hospital encounter of 03/07/17 (from the past 48 hour(s))  Glucose, capillary     Status: Abnormal   Collection Time: 03/07/17  8:35 AM  Result Value Ref Range   Glucose-Capillary 239 (H) 65 - 99 mg/dL  Basic metabolic panel     Status: Abnormal   Collection Time: 03/07/17  8:48 AM  Result Value Ref Range   Sodium 133 (L) 135 - 145 mmol/L   Potassium 3.6 3.5 - 5.1 mmol/L   Chloride 96 (L) 101 - 111 mmol/L   CO2 25 22 - 32 mmol/L   Glucose, Bld 204 (H) 65 - 99 mg/dL   BUN 7 6 - 20 mg/dL   Creatinine, Ser 0.95 0.61 - 1.24 mg/dL   Calcium 8.8 (L) 8.9 - 10.3 mg/dL   GFR calc non Af Amer >60 >60 mL/min   GFR calc Af Amer >60 >60 mL/min    Comment: (NOTE) The eGFR has been calculated using the CKD EPI equation. This calculation has not been validated in all clinical situations. eGFR's persistently <60 mL/min signify possible Chronic Kidney Disease.    Anion gap 12 5 - 15  CBC     Status: None   Collection Time: 03/07/17  8:48 AM  Result Value Ref Range   WBC 5.8 4.0 - 10.5 K/uL   RBC 5.01 4.22 - 5.81 MIL/uL   Hemoglobin 14.4 13.0 - 17.0 g/dL   HCT 43.6 39.0 - 52.0 %   MCV 87.0 78.0 - 100.0 fL   MCH 28.7 26.0 - 34.0 pg   MCHC 33.0 30.0 - 36.0 g/dL    RDW 14.0 11.5 - 15.5 %   Platelets 221 150 - 400 K/uL   No results found.  Review of Systems  All other systems reviewed and are negative.   Blood pressure 116/72, pulse 91, temperature 98.1 F (36.7 C), temperature  source Oral, resp. rate 20, height _0  (1.854 m), weight 180 lb (81.6 kg), SpO2 100 %. Physical Exam  Patient is alert, oriented, no adenopathy, well-dressed, normal affect, normal respiratory effort. Examination the wound is approximately 1 cm diameter 5 mm deep there is necrotic tissue the base of the wound previously there is healthy granulation tissue.  There is no purulence no cellulitis no odor.   Assessment/Plan 1. 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      Newt Minion, MD 03/07/2017, 9:39 AM

## 2017-03-07 NOTE — Anesthesia Postprocedure Evaluation (Signed)
Anesthesia Post Note  Patient: Brian Zavala  Procedure(s) Performed: EXCISION FIBULAR HEAD RIGHT BELOW KNEE AMPUTATION (Right Leg Lower)     Patient location during evaluation: PACU Anesthesia Type: General Level of consciousness: awake and alert, patient cooperative and oriented Pain management: pain level controlled Vital Signs Assessment: post-procedure vital signs reviewed and stable Respiratory status: spontaneous breathing, nonlabored ventilation and respiratory function stable Cardiovascular status: blood pressure returned to baseline and stable Postop Assessment: no apparent nausea or vomiting Anesthetic complications: no    Last Vitals:  Vitals:   03/07/17 1215 03/07/17 1220  BP: 120/72   Pulse: 82   Resp: 14   Temp:  (!) 36 C  SpO2: 95%     Last Pain:  Vitals:   03/07/17 1215  TempSrc:   PainSc: 0-No pain                 JACKSON,E. CARSWELL

## 2017-03-07 NOTE — Transfer of Care (Signed)
Immediate Anesthesia Transfer of Care Note  Patient: Brian Zavala  Procedure(s) Performed: EXCISION FIBULAR HEAD RIGHT BELOW KNEE AMPUTATION (Right Leg Lower)  Patient Location: PACU  Anesthesia Type:General  Level of Consciousness: drowsy and patient cooperative  Airway & Oxygen Therapy: Patient Spontanous Breathing and Patient connected to face mask oxygen  Post-op Assessment: Report given to RN and Post -op Vital signs reviewed and stable  Post vital signs: Reviewed and stable  Last Vitals:  Vitals:   03/07/17 0834 03/07/17 1146  BP: 116/72   Pulse: 91 74  Resp: 20 15  Temp: 36.7 C   SpO2: 100% 99%    Last Pain:  Vitals:   03/07/17 0834  TempSrc: Oral      Patients Stated Pain Goal: 2 (03/07/17 0851)  Complications: No apparent anesthesia complications

## 2017-03-07 NOTE — Anesthesia Preprocedure Evaluation (Addendum)
Anesthesia Evaluation  Patient identified by MRN, date of birth, ID band Patient awake    Reviewed: Allergy & Precautions, NPO status , Patient's Chart, lab work & pertinent test results  History of Anesthesia Complications Negative for: history of anesthetic complications  Airway Mallampati: I  TM Distance: >3 FB Neck ROM: Full    Dental  (+) Poor Dentition, Missing, Dental Advisory Given, Chipped   Pulmonary COPD, Current Smoker,    breath sounds clear to auscultation       Cardiovascular hypertension, Pt. on medications (-) angina+ Peripheral Vascular Disease   Rhythm:Regular Rate:Normal  '07 ECHO: EF 55-60%, valves OK   Neuro/Psych negative neurological ROS     GI/Hepatic negative GI ROS, Neg liver ROS,   Endo/Other  diabetes (glu 239), Oral Hypoglycemic Agents, Insulin Dependent  Renal/GU negative Renal ROS     Musculoskeletal  (+) Arthritis , steroids,    Abdominal   Peds  Hematology negative hematology ROS (+)   Anesthesia Other Findings   Reproductive/Obstetrics                            Anesthesia Physical Anesthesia Plan  ASA: III  Anesthesia Plan: General   Post-op Pain Management:    Induction: Intravenous  PONV Risk Score and Plan: 2 and Ondansetron and Dexamethasone  Airway Management Planned: LMA  Additional Equipment:   Intra-op Plan:   Post-operative Plan:   Informed Consent: I have reviewed the patients History and Physical, chart, labs and discussed the procedure including the risks, benefits and alternatives for the proposed anesthesia with the patient or authorized representative who has indicated his/her understanding and acceptance.   Dental advisory given  Plan Discussed with: CRNA and Surgeon  Anesthesia Plan Comments: (Plan routine monitors, GA, LMA OK)        Anesthesia Quick Evaluation

## 2017-03-08 ENCOUNTER — Encounter (HOSPITAL_COMMUNITY): Payer: Self-pay | Admitting: Orthopedic Surgery

## 2017-03-13 ENCOUNTER — Ambulatory Visit (INDEPENDENT_AMBULATORY_CARE_PROVIDER_SITE_OTHER): Payer: Medicare (Managed Care) | Admitting: Orthopedic Surgery

## 2017-03-13 ENCOUNTER — Encounter (INDEPENDENT_AMBULATORY_CARE_PROVIDER_SITE_OTHER): Payer: Self-pay | Admitting: Orthopedic Surgery

## 2017-03-13 DIAGNOSIS — Z89519 Acquired absence of unspecified leg below knee: Secondary | ICD-10-CM

## 2017-03-13 DIAGNOSIS — L97214 Non-pressure chronic ulcer of right calf with necrosis of bone: Secondary | ICD-10-CM

## 2017-03-13 NOTE — Progress Notes (Signed)
Post-Op Visit Note   Patient: Brian Zavala           Date of Birth: 22-Feb-1951           MRN: [id number] Visit Date: 03/13/2017 PCP: Jethro Bastos, MD  Chief Complaint:  Chief Complaint  Patient presents with  . Right Knee - Routine Post Op    HPI:  HPI The patient is a 67 year old gentleman seen today 6 days out from right below knee revision with excision of fibular head.   Ortho Exam  Incision is well approximated with sutures. Serous drainage. No erythema or sign of infection.   Visit Diagnoses:  1. S/P unilateral BKA (below knee amputation) (HCC)   2. 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 Follow up in office in 2 weeks for suture removal. Begin wearing shrinker over dressing.   Follow-Up Instructions: Return in about 2 weeks (around 03/27/2017).   Imaging: No results found.  Orders:  No orders of the defined types were placed in this encounter.  No orders of the defined types were placed in this encounter.    PMFS History: Patient Active Problem List   Diagnosis Date Noted  . Subacute osteomyelitis of right fibula (HCC)   . 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 Diabetes mellitus, insulin dependent (IDDM), controlled (HCC)   . Type 2 diabetes mellitus with diabetic neuropathy, with long-term current use of insulin (HCC)   . S/P unilateral BKA (below knee amputation) (HCC)   . Rheumatoid arthritis (HCC)   . Perineal abscess 12/02/2014  . Abscess 12/02/2014  . Gallstones 09/23/2013  . Non-intractable cyclical vomiting with nausea 09/23/2013  . Post-operative state 12/21/2012  . Cellulitis of perineum 11/30/2012  . Epidermal inclusion cyst 11/30/2012   Past Medical History:  Diagnosis Date  . Arthritis    "pretty  much all over"  . COPD (chronic obstructive pulmonary disease) (HCC)   . Hyperlipidemia   . Hypertension   . Neuromuscular disorder (HCC)   . Neuropathy   . Type II diabetes mellitus (HCC)   . Wears glasses   . Wears partial dentures    top and bottom partials    Family History  Problem Relation Age of Onset  . Cirrhosis Father     Past Surgical History:  Procedure Laterality Date  . BACK SURGERY    . CHOLECYSTECTOMY N/A 10/26/2013   Procedure: LAPAROSCOPIC CHOLECYSTECTOMY WITH INTRAOPERATIVE CHOLANGIOGRAM;  Surgeon: Clovis Pu. Cornett, MD;  Location[facility];  Service: General;  Laterality: N/A;  . COLONOSCOPY    . CYST EXCISION Bilateral 02/15/2015   Procedure: LEFT INDEX FINGER AND RIGHT MIDDLE FINGER NODULE EXCISION;  Surgeon: Tarry Kos, MD;  Location: [facility];  Service: Orthopedics;  Laterality: Bilateral;  . CYST EXCISION PERINEAL N/A 12/08/2012   Procedure: CYST EXCISION PERINeum;  Surgeon: Maisie Fus A. Cornett, MD;  Location: [facility];  Service: General;  Laterality: N/A;  . FOOT AMPUTATION Right 2005  . I&D EXTREMITY Right 03/07/2017   Procedure: EXCISION FIBULAR HEAD RIGHT BELOW KNEE AMPUTATION;  Surgeon: [name], MD;  Location: [facility];  Service: Orthopedics;  Laterality: Right;  . INCISE AND DRAIN ABCESS  12/02/2014   PERINEAL ABSCESS  . INCISION AND DRAINAGE PERIRECTAL ABSCESS  Left 12/02/2014   Procedure: IRRIGATION AND DEBRIDEMENT PERINEAL ABSCESS;  Surgeon: Abigail Miyamoto, MD;  Location[facility];  Service: General;  Laterality: Left;  . LEG AMPUTATION BELOW KNEE Right 2005  . LUMBAR DISC SURGERY  82,90   ruptured disc  . SCAR REVISION Right 2005   @ amputation  . TOE AMPUTATION Right 2005   Social History   Occupational History  . Not on file  Tobacco Use  . Smoking status: Current Every Day Smoker    Packs/day: 1.00    Years: 47.00    Pack years: 47.00    Types: Cigarettes  . Smokeless tobacco: Never Used  Substance and  Sexual Activity  . Alcohol use: No  . Drug use: No  . Sexual activity: No

## 2017-03-17 ENCOUNTER — Encounter (INDEPENDENT_AMBULATORY_CARE_PROVIDER_SITE_OTHER): Payer: Self-pay | Admitting: Orthopedic Surgery

## 2017-03-17 ENCOUNTER — Ambulatory Visit (INDEPENDENT_AMBULATORY_CARE_PROVIDER_SITE_OTHER): Payer: Medicare (Managed Care) | Admitting: Orthopedic Surgery

## 2017-03-17 DIAGNOSIS — L97214 Non-pressure chronic ulcer of right calf with necrosis of bone: Secondary | ICD-10-CM

## 2017-03-17 MED ORDER — SULFAMETHOXAZOLE-TRIMETHOPRIM 800-160 MG PO TABS: 1.0000 | ORAL_TABLET | Freq: Two times a day (BID) | ORAL | 0 refills | Status: DC

## 2017-03-17 NOTE — Progress Notes (Signed)
Office Visit Note   Patient: Brian Zavala           Date of Birth: 03-10-1950           MRN: [id number] Visit Date: 03/17/2017              Requested by: Jethro Bastos, MD [address] [place], [place] [address] PCP: Jethro Bastos, MD  Chief Complaint  Patient presents with  . Right Leg - Follow-up, Wound Check, Routine Post Op      HPI: Patient presents follow-up status post excision of the fibular head approximately 10 days out from surgery.  Patient was seen in the office 1 week after surgery well he states that 2 days ago on Saturday he developed acute pain and has been feeling sick he developed redness and drainage from the wound.  Assessment & Plan: Visit Diagnoses:  1. 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  We will start sulfamethoxazole trimethoprim twice a day and mupirocin dressing changes daily follow-up in 2 weeks.  Discussed that if patient has any worsening of his symptoms we would need to follow-up immediately for an above-the-knee amputation.  Follow-Up Instructions: Return in about 2 weeks (around 03/31/2017).   Ortho Exam  Patient is alert, oriented, no adenopathy, well-dressed, normal affect, normal respiratory effort. Examination patient has redness with a small amount of purulent drainage.  The wound was opened up there is no deep abscess.  Patient does have induration and cellulitis approximately 3 cm around the wound.  There is no ascending cellulitis.  Imaging: No results found. No images are attached to the encounter.  Labs: Lab Results  Component Value Date   HGBA1C 7.6 (H) 03/07/2017   HGBA1C 9.4 (H) 12/03/2014   REPTSTATUS 12/06/2014 FINAL 12/02/2014   REPTSTATUS 12/08/2014 FINAL 12/02/2014   GRAMSTAIN  12/02/2014    MODERATE WBC PRESENT, PREDOMINANTLY PMN NO SQUAMOUS EPITHELIAL CELLS SEEN MODERATE YEAST ABUNDANT  GRAM POSITIVE COCCI IN PAIRS    GRAMSTAIN  12/02/2014    MODERATE WBC PRESENT, PREDOMINANTLY PMN NO SQUAMOUS EPITHELIAL CELLS SEEN MODERATE YEAST ABUNDANT GRAM POSITIVE COCCI IN PAIRS    CULT  12/02/2014    MODERATE DIPHTHEROIDS(CORYNEBACTERIUM SPECIES) Note: Standardized susceptibility testing for this organism is not available. Performed at [facility]    CULT  12/02/2014    NO ANAEROBES ISOLATED Performed at [facility]     @LABSALLVALUES (HGBA1)@  There is no height or weight on file to calculate BMI.  Orders:  No orders of the defined types were placed in this encounter.  Meds ordered this encounter  Medications  . sulfamethoxazole-trimethoprim (BACTRIM DS,SEPTRA DS) 800-160 MG tablet    Sig: Take 1 tablet by mouth 2 (two) times daily.    Dispense:  60 tablet    Refill:  0     Procedures: No procedures performed  Clinical Data: No additional findings.  ROS:  All other systems negative, except as noted in the HPI. Review of Systems  Objective: Vital Signs: There were no vitals taken for this visit.  Specialty Comments:  No specialty comments available.  PMFS History: Patient Active Problem List   Diagnosis Date Noted  . Subacute osteomyelitis of right fibula (HCC)   . 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 Non-pressure chronic ulcer of right calf with necrosis of  bone (HCC) 02/03/2017  . Diabetes mellitus, insulin dependent (IDDM), controlled (HCC)   . Type 2 diabetes mellitus with diabetic neuropathy, with long-term current use of insulin (HCC)   . S/P unilateral BKA (below knee amputation) (HCC)   . Rheumatoid arthritis (HCC)   . Perineal abscess 12/02/2014  . Abscess 12/02/2014  . Gallstones 09/23/2013  . Non-intractable cyclical vomiting with nausea 09/23/2013  . Post-operative state 12/21/2012  . Cellulitis of perineum 11/30/2012  . Epidermal inclusion cyst 11/30/2012   Past  Medical History:  Diagnosis Date  . Arthritis    "pretty much all over"  . COPD (chronic obstructive pulmonary disease) (HCC)   . Hyperlipidemia   . Hypertension   . Neuromuscular disorder (HCC)   . Neuropathy   . Type II diabetes mellitus (HCC)   . Wears glasses   . Wears partial dentures    top and bottom partials    Family History  Problem Relation Age of Onset  . Cirrhosis Father     Past Surgical History:  Procedure Laterality Date  . BACK SURGERY    . CHOLECYSTECTOMY N/A 10/26/2013   Procedure: LAPAROSCOPIC CHOLECYSTECTOMY WITH INTRAOPERATIVE CHOLANGIOGRAM;  Surgeon: Clovis Pu. Cornett, MD;  Location: [facility];  Service: General;  Laterality: N/A;  . COLONOSCOPY    . CYST EXCISION Bilateral 02/15/2015   Procedure: LEFT INDEX FINGER AND RIGHT MIDDLE FINGER NODULE EXCISION;  Surgeon: Tarry Kos, MD;  Location: [facility];  Service: Orthopedics;  Laterality: Bilateral;  . CYST EXCISION PERINEAL N/A 12/08/2012   Procedure: CYST EXCISION PERINeum;  Surgeon: Maisie Fus A. Cornett, MD;  Location: [facility];  Service: General;  Laterality: N/A;  . FOOT AMPUTATION Right 2005  . I&D EXTREMITY Right 03/07/2017   Procedure: EXCISION FIBULAR HEAD RIGHT BELOW KNEE AMPUTATION;  Surgeon: [name], MD;  Location: [facility] OR;  Service: Orthopedics;  Laterality: Right;  . INCISE AND DRAIN ABCESS  12/02/2014   PERINEAL ABSCESS  . INCISION AND DRAINAGE PERIRECTAL ABSCESS Left 12/02/2014   Procedure: IRRIGATION AND DEBRIDEMENT PERINEAL ABSCESS;  Surgeon: Abigail Miyamoto, MD;  Location: [facility];  Service: General;  Laterality: Left;  . LEG AMPUTATION BELOW KNEE Right 2005  . LUMBAR DISC SURGERY  82,90   ruptured disc  . SCAR REVISION Right 2005   @ amputation  . TOE AMPUTATION Right 2005   Social History   Occupational History  . Not on file  Tobacco Use  . Smoking status: Current Every Day Smoker    Packs/day: 1.00    Years: 47.00    Pack years: 47.00    Types:  Cigarettes  . Smokeless tobacco: Never Used  Substance and Sexual Activity  . Alcohol use: No  . Drug use: No  . Sexual activity: No

## 2017-03-20 ENCOUNTER — Inpatient Hospital Stay (INDEPENDENT_AMBULATORY_CARE_PROVIDER_SITE_OTHER): Payer: Medicare (Managed Care) | Admitting: Orthopedic Surgery

## 2017-03-27 ENCOUNTER — Encounter (INDEPENDENT_AMBULATORY_CARE_PROVIDER_SITE_OTHER): Payer: Self-pay | Admitting: Orthopedic Surgery

## 2017-03-27 ENCOUNTER — Ambulatory Visit (INDEPENDENT_AMBULATORY_CARE_PROVIDER_SITE_OTHER): Payer: Medicare (Managed Care) | Admitting: Orthopedic Surgery

## 2017-03-27 DIAGNOSIS — L97214 Non-pressure chronic ulcer of right calf with necrosis of bone: Secondary | ICD-10-CM

## 2017-03-27 DIAGNOSIS — Z89511 Acquired absence of right leg below knee: Secondary | ICD-10-CM

## 2017-03-27 NOTE — Progress Notes (Signed)
Office Visit Note   Patient: Brian Zavala           Date of Birth: 1951/01/17           MRN: [id number] Visit Date: 03/27/2017              Requested by: Jethro Bastos, MD [address] [place], [place] [address] PCP: Jethro Bastos, MD  Chief Complaint  Patient presents with  . Right Leg - Routine Post Op    03/07/17 Excision fibular head right BKA      HPI: Patient is a 67 year old gentleman who is 3 weeks status post excision of fibular head right transtibial amputation is currently on Bactrim DS and Bactroban daily dressing changes.  Assessment & Plan: Visit Diagnoses:  1. History of right below knee amputation (HCC)   2. 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  Discussed the importance of proper cleansing he is to wash this daily with soap and water and use a washcloth to gently debride the fibrinous exudative tissue.  Continue with compression continue with oral and topical antibiotics.  Follow-Up Instructions: Return in about 2 weeks (around 04/10/2017).   Ortho Exam  Patient is alert, oriented, no adenopathy, well-dressed, normal affect, normal respiratory effort. Examination patient does have some swelling the wound is gaped open with debridement with gauze there is excellent granulation tissue at the base of the wound.  There is no cellulitis no odor no drainage.  Iodosorb plus a 2 x 2 gauze was applied to pack the wound open with an Ace wrap for compression.  Imaging: No results found. No images are attached to the encounter.  Labs: Lab Results  Component Value Date   HGBA1C 7.6 (H) 03/07/2017   HGBA1C 9.4 (H) 12/03/2014   REPTSTATUS 12/06/2014 FINAL 12/02/2014   REPTSTATUS 12/08/2014 FINAL 12/02/2014   GRAMSTAIN  12/02/2014    MODERATE WBC PRESENT, PREDOMINANTLY PMN NO SQUAMOUS EPITHELIAL CELLS SEEN MODERATE  YEAST ABUNDANT GRAM POSITIVE COCCI IN PAIRS    GRAMSTAIN  12/02/2014    MODERATE WBC PRESENT, PREDOMINANTLY PMN NO SQUAMOUS EPITHELIAL CELLS SEEN MODERATE YEAST ABUNDANT GRAM POSITIVE COCCI IN PAIRS    CULT  12/02/2014    MODERATE DIPHTHEROIDS(CORYNEBACTERIUM SPECIES) Note: Standardized susceptibility testing for this organism is not available. Performed at [facility]    CULT  12/02/2014    NO ANAEROBES ISOLATED Performed at [facility]     @LABSALLVALUES (HGBA1)@  There is no height or weight on file to calculate BMI.  Orders:  No orders of the defined types were placed in this encounter.  No orders of the defined types were placed in this encounter.    Procedures: No procedures performed  Clinical Data: No additional findings.  ROS:  All other systems negative, except as noted in the HPI. Review of Systems  Objective: Vital Signs: There were no vitals taken for this visit.  Specialty Comments:  No specialty comments available.  PMFS History: Patient Active Problem List   Diagnosis Date Noted  . Subacute osteomyelitis of right fibula (HCC)   . 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 Diabetes mellitus, insulin dependent (IDDM), controlled (HCC)   . Type 2 diabetes mellitus with diabetic neuropathy, with long-term  current use of insulin (HCC)   . S/P unilateral BKA (below knee amputation) (HCC)   . Rheumatoid arthritis (HCC)   . Perineal abscess 12/02/2014  . Abscess 12/02/2014  . Gallstones 09/23/2013  . Non-intractable cyclical vomiting with nausea 09/23/2013  . Post-operative state 12/21/2012  . Cellulitis of perineum 11/30/2012  . Epidermal inclusion cyst 11/30/2012   Past Medical History:  Diagnosis Date  . Arthritis    "pretty much all over"  . COPD (chronic obstructive pulmonary disease) (HCC)     . Hyperlipidemia   . Hypertension   . Neuromuscular disorder (HCC)   . Neuropathy   . Type II diabetes mellitus (HCC)   . Wears glasses   . Wears partial dentures    top and bottom partials    Family History  Problem Relation Age of Onset  . Cirrhosis Father     Past Surgical History:  Procedure Laterality Date  . BACK SURGERY    . CHOLECYSTECTOMY N/A 10/26/2013   Procedure: LAPAROSCOPIC CHOLECYSTECTOMY WITH INTRAOPERATIVE CHOLANGIOGRAM;  Surgeon: Clovis Pu. Cornett, MD;  Location: [facility];  Service: General;  Laterality: N/A;  . COLONOSCOPY    . CYST EXCISION Bilateral 02/15/2015   Procedure: LEFT INDEX FINGER AND RIGHT MIDDLE FINGER NODULE EXCISION;  Surgeon: Tarry Kos, MD;  Location: [facility];  Service: Orthopedics;  Laterality: Bilateral;  . CYST EXCISION PERINEAL N/A 12/08/2012   Procedure: CYST EXCISION PERINeum;  Surgeon: Maisie Fus A. Cornett, MD;  Location: [facility];  Service: General;  Laterality: N/A;  . FOOT AMPUTATION Right 2005  . I&D EXTREMITY Right 03/07/2017   Procedure: EXCISION FIBULAR HEAD RIGHT BELOW KNEE AMPUTATION;  Surgeon: [name], MD;  Location: [facility];  Service: Orthopedics;  Laterality: Right;  . INCISE AND DRAIN ABCESS  12/02/2014   PERINEAL ABSCESS  . INCISION AND DRAINAGE PERIRECTAL ABSCESS Left 12/02/2014   Procedure: IRRIGATION AND DEBRIDEMENT PERINEAL ABSCESS;  Surgeon: Abigail Miyamoto, MD;  Location: [facility];  Service: General;  Laterality: Left;  . LEG AMPUTATION BELOW KNEE Right 2005  . LUMBAR DISC SURGERY  82,90   ruptured disc  . SCAR REVISION Right 2005   @ amputation  . TOE AMPUTATION Right 2005   Social History   Occupational History  . Not on file  Tobacco Use  . Smoking status: Current Every Day Smoker    Packs/day: 1.00    Years: 47.00    Pack years: 47.00    Types: Cigarettes  . Smokeless tobacco: Never Used  Substance and Sexual Activity  . Alcohol use: No  . Drug use: No  . Sexual  activity: No

## 2017-04-14 ENCOUNTER — Ambulatory Visit (INDEPENDENT_AMBULATORY_CARE_PROVIDER_SITE_OTHER): Payer: Medicare (Managed Care) | Admitting: Orthopedic Surgery

## 2017-04-14 ENCOUNTER — Encounter (INDEPENDENT_AMBULATORY_CARE_PROVIDER_SITE_OTHER): Payer: Self-pay | Admitting: Orthopedic Surgery

## 2017-04-14 VITALS — Ht 73.0 in | Wt 180.0 lb

## 2017-04-14 DIAGNOSIS — L97214 Non-pressure chronic ulcer of right calf with necrosis of bone: Secondary | ICD-10-CM

## 2017-04-14 NOTE — Progress Notes (Signed)
Office Visit Note   Patient: Brian Zavala           Date of Birth: 04-24-1950           MRN: [id number] Visit Date: 04/14/2017              Requested by: Jethro Bastos, MD [address] [place], [place] [address] PCP: Jethro Bastos, MD  Chief Complaint  Patient presents with  . Right Leg - Routine Post Op    03/07/17 excision fibular head right BKA      HPI: Patient presents for follow-up status post excision of fibular head for osteomyelitis of the right transtibial amputation fibular head.  Assessment & Plan: Visit Diagnoses:  1. 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  Follow-Up Instructions: Return in about 1 week (around 04/21/2017).   Ortho Exam  Patient is alert, oriented, no adenopathy, well-dressed, normal affect, normal respiratory effort. Examination the wound has 100% fibrinous exudative tissue.  4 x 4 gauze was used to debride this back to bleeding viable healthy granulation tissue.  There is no purulence no odor no cellulitis no drainage no signs of infection.  There is no exposed bone.  Imaging: No results found. No images are attached to the encounter.  Labs: Lab Results  Component Value Date   HGBA1C 7.6 (H) 03/07/2017   HGBA1C 9.4 (H) 12/03/2014   REPTSTATUS 12/06/2014 FINAL 12/02/2014   REPTSTATUS 12/08/2014 FINAL 12/02/2014   GRAMSTAIN  12/02/2014    MODERATE WBC PRESENT, PREDOMINANTLY PMN NO SQUAMOUS EPITHELIAL CELLS SEEN MODERATE YEAST ABUNDANT GRAM POSITIVE COCCI IN PAIRS    GRAMSTAIN  12/02/2014    MODERATE WBC PRESENT, PREDOMINANTLY PMN NO SQUAMOUS EPITHELIAL CELLS SEEN MODERATE YEAST ABUNDANT GRAM POSITIVE COCCI IN PAIRS    CULT  12/02/2014    MODERATE DIPHTHEROIDS(CORYNEBACTERIUM SPECIES) Note: Standardized susceptibility  testing for this organism is not available. Performed at [facility]    CULT  12/02/2014    NO ANAEROBES ISOLATED Performed at [facility]     @LABSALLVALUES (HGBA1)@  Body mass index is 23.75 kg/m.  Orders:  No orders of the defined types were placed in this encounter.  No orders of the defined types were placed in this encounter.    Procedures: No procedures performed  Clinical Data: No additional findings.  ROS:  All other systems negative, except as noted in the HPI. Review of Systems  Objective: Vital Signs: Ht 6\' 1"  (1.854 m)   Wt 180 lb (81.6 kg)   BMI 23.75 kg/m   Specialty Comments:  No specialty comments available.  PMFS History: Patient Active Problem List   Diagnosis Date Noted  . Subacute osteomyelitis of right fibula (HCC)   . 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 Diabetes mellitus, insulin dependent (IDDM), controlled (HCC)   . Type 2 diabetes mellitus with diabetic neuropathy, with long-term current use of insulin (HCC)   . S/P unilateral BKA (below knee amputation) (HCC)   . Rheumatoid arthritis (HCC)   . Perineal abscess 12/02/2014  . Abscess 12/02/2014  . Gallstones 09/23/2013  . Non-intractable cyclical vomiting with nausea 09/23/2013  . Post-operative state 12/21/2012  . Cellulitis of perineum  11/30/2012  . Epidermal inclusion cyst 11/30/2012   Past Medical History:  Diagnosis Date  . Arthritis    "pretty much all over"  . COPD (chronic obstructive pulmonary disease) (HCC)   . Hyperlipidemia   . Hypertension   . Neuromuscular disorder (HCC)   . Neuropathy   . Type II diabetes mellitus (HCC)   . Wears glasses   . Wears partial dentures    top and bottom partials    Family History  Problem Relation Age of Onset  . Cirrhosis Father     Past Surgical History:  Procedure  Laterality Date  . BACK SURGERY    . CHOLECYSTECTOMY N/A 10/26/2013   Procedure: LAPAROSCOPIC CHOLECYSTECTOMY WITH INTRAOPERATIVE CHOLANGIOGRAM;  Surgeon: Clovis Pu. Cornett, MD;  Location: [facility];  Service: General;  Laterality: N/A;  . COLONOSCOPY    . CYST EXCISION Bilateral 02/15/2015   Procedure: LEFT INDEX FINGER AND RIGHT MIDDLE FINGER NODULE EXCISION;  Surgeon: Tarry Kos, MD;  Location: [facility];  Service: Orthopedics;  Laterality: Bilateral;  . CYST EXCISION PERINEAL N/A 12/08/2012   Procedure: CYST EXCISION PERINeum;  Surgeon: Maisie Fus A. Cornett, MD;  Location: [facility];  Service: General;  Laterality: N/A;  . FOOT AMPUTATION Right 2005  . I&D EXTREMITY Right 03/07/2017   Procedure: EXCISION FIBULAR HEAD RIGHT BELOW KNEE AMPUTATION;  Surgeon: [name], MD;  Location: [facility] OR;  Service: Orthopedics;  Laterality: Right;  . INCISE AND DRAIN ABCESS  12/02/2014   PERINEAL ABSCESS  . INCISION AND DRAINAGE PERIRECTAL ABSCESS Left 12/02/2014   Procedure: IRRIGATION AND DEBRIDEMENT PERINEAL ABSCESS;  Surgeon: Abigail Miyamoto, MD;  Location: [facility];  Service: General;  Laterality: Left;  . LEG AMPUTATION BELOW KNEE Right 2005  . LUMBAR DISC SURGERY  82,90   ruptured disc  . SCAR REVISION Right 2005   @ amputation  . TOE AMPUTATION Right 2005   Social History   Occupational History  . Not on file  Tobacco Use  . Smoking status: Current Every Day Smoker    Packs/day: 1.00    Years: 47.00    Pack years: 47.00    Types: Cigarettes  . Smokeless tobacco: Never Used  Substance and Sexual Activity  . Alcohol use: No  . Drug use: No  . Sexual activity: No

## 2017-04-23 ENCOUNTER — Ambulatory Visit (INDEPENDENT_AMBULATORY_CARE_PROVIDER_SITE_OTHER): Payer: Medicare (Managed Care) | Admitting: Orthopedic Surgery

## 2017-04-23 ENCOUNTER — Encounter (INDEPENDENT_AMBULATORY_CARE_PROVIDER_SITE_OTHER): Payer: Self-pay | Admitting: Orthopedic Surgery

## 2017-04-23 VITALS — Ht 73.0 in | Wt 180.0 lb

## 2017-04-23 DIAGNOSIS — L97214 Non-pressure chronic ulcer of right calf with necrosis of bone: Secondary | ICD-10-CM

## 2017-04-23 NOTE — Progress Notes (Signed)
Office Visit Note   Patient: Brian Zavala           Date of Birth: Oct 30, 1950           MRN: [id number] Visit Date: 04/23/2017              Requested by: Jethro Bastos, MD [address] [place], [place] [address] PCP: Jethro Bastos, MD  Chief Complaint  Patient presents with  . Right Leg - Routine Post Op    03/07/17 excision fibular head right BKA      HPI: Patient is a 67 year old gentleman who presents follow-up status post  Asses transtibial amputation salvage for partial excision of the fibula.  Patient denies any fever or chills he states that it looks about the same to him.  sment & Plan: Visit Diagnoses:  1. 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  We will need to plan for an above-the-knee amputation.  The wound is open he has no cellulitis no signs of infection there is no odor no drainage.  We will continue wound care dressing changes daily patient states he needs some time to think about scheduling his above-knee amputation.  We will plan for surgery at patient's convenience.  Follow-Up Instructions: Return in about 2 weeks (around 05/07/2017).   Ortho Exam  Patient is alert, oriented, no adenopathy, well-dressed, normal affect, normal respiratory effort. Examination patient is ambulating in a wheelchair the tissue around the wound has no redness no cellulitis there is no tenderness to palpation.  The wound bed was debrided patient has had further wound breakdown the granulation tissue now shows exposed bone.  The bone is desiccated and dry there is no drainage no signs of infection.  Imaging: No results found. No images are attached to the encounter.  Labs: Lab Results  Component Value Date   HGBA1C 7.6 (H) 03/07/2017   HGBA1C 9.4 (H) 12/03/2014   REPTSTATUS 12/06/2014 FINAL 12/02/2014   REPTSTATUS 12/08/2014 FINAL  12/02/2014   GRAMSTAIN  12/02/2014    MODERATE WBC PRESENT, PREDOMINANTLY PMN NO SQUAMOUS EPITHELIAL CELLS SEEN MODERATE YEAST ABUNDANT GRAM POSITIVE COCCI IN PAIRS    GRAMSTAIN  12/02/2014    MODERATE WBC PRESENT, PREDOMINANTLY PMN NO SQUAMOUS EPITHELIAL CELLS SEEN MODERATE YEAST ABUNDANT GRAM POSITIVE COCCI IN PAIRS    CULT  12/02/2014    MODERATE DIPHTHEROIDS(CORYNEBACTERIUM SPECIES) Note: Standardized susceptibility testing for this organism is not available. Performed at [facility]    CULT  12/02/2014    NO ANAEROBES ISOLATED Performed at [facility]     @LABSALLVALUES (HGBA1)@  Body mass index is 23.75 kg/m.  Orders:  No orders of the defined types were placed in this encounter.  No orders of the defined types were placed in this encounter.    Procedures: No procedures performed  Clinical Data: No additional findings.  ROS:  All other systems negative, except as noted in the HPI. Review of Systems  Objective: Vital Signs: Ht 6\' 1"  (1.854 m)   Wt 180 lb (81.6 kg)   BMI 23.75 kg/m   Specialty Comments:  No specialty comments available.  PMFS History: Patient Active Problem List   Diagnosis Date Noted  . Subacute osteomyelitis of right fibula (HCC)   . 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 Non-pressure chronic ulcer of right  calf with necrosis of bone (HCC) 02/03/2017  . Diabetes mellitus, insulin dependent (IDDM), controlled (HCC)   . Type 2 diabetes mellitus with diabetic neuropathy, with long-term current use of insulin (HCC)   . S/P unilateral BKA (below knee amputation) (HCC)   . Rheumatoid arthritis (HCC)   . Perineal abscess 12/02/2014  . Abscess 12/02/2014  . Gallstones 09/23/2013  . Non-intractable cyclical vomiting with nausea 09/23/2013  . Post-operative state 12/21/2012  . Cellulitis of perineum 11/30/2012  . Epidermal inclusion cyst 11/30/2012   Past Medical  History:  Diagnosis Date  . Arthritis    "pretty much all over"  . COPD (chronic obstructive pulmonary disease) (HCC)   . Hyperlipidemia   . Hypertension   . Neuromuscular disorder (HCC)   . Neuropathy   . Type II diabetes mellitus (HCC)   . Wears glasses   . Wears partial dentures    top and bottom partials    Family History  Problem Relation Age of Onset  . Cirrhosis Father     Past Surgical History:  Procedure Laterality Date  . BACK SURGERY    . CHOLECYSTECTOMY N/A 10/26/2013   Procedure: LAPAROSCOPIC CHOLECYSTECTOMY WITH INTRAOPERATIVE CHOLANGIOGRAM;  Surgeon: Clovis Pu. Cornett, MD;  Location: [facility];  Service: General;  Laterality: N/A;  . COLONOSCOPY    . CYST EXCISION Bilateral 02/15/2015   Procedure: LEFT INDEX FINGER AND RIGHT MIDDLE FINGER NODULE EXCISION;  Surgeon: Tarry Kos, MD;  Location: [facility];  Service: Orthopedics;  Laterality: Bilateral;  . CYST EXCISION PERINEAL N/A 12/08/2012   Procedure: CYST EXCISION PERINeum;  Surgeon: Maisie Fus A. Cornett, MD;  Location: [facility];  Service: General;  Laterality: N/A;  . FOOT AMPUTATION Right 2005  . I&D EXTREMITY Right 03/07/2017   Procedure: EXCISION FIBULAR HEAD RIGHT BELOW KNEE AMPUTATION;  Surgeon: [name], MD;  Location: [facility];  Service: Orthopedics;  Laterality: Right;  . INCISE AND DRAIN ABCESS  12/02/2014   PERINEAL ABSCESS  . INCISION AND DRAINAGE PERIRECTAL ABSCESS Left 12/02/2014   Procedure: IRRIGATION AND DEBRIDEMENT PERINEAL ABSCESS;  Surgeon: Abigail Miyamoto, MD;  Location: [facility];  Service: General;  Laterality: Left;  . LEG AMPUTATION BELOW KNEE Right 2005  . LUMBAR DISC SURGERY  82,90   ruptured disc  . SCAR REVISION Right 2005   @ amputation  . TOE AMPUTATION Right 2005   Social History   Occupational History  . Not on file  Tobacco Use  . Smoking status: Current Every Day Smoker    Packs/day: 1.00    Years: 47.00    Pack years: 47.00    Types:  Cigarettes  . Smokeless tobacco: Never Used  Substance and Sexual Activity  . Alcohol use: No  . Drug use: No  . Sexual activity: No

## 2017-04-25 ENCOUNTER — Encounter (HOSPITAL_BASED_OUTPATIENT_CLINIC_OR_DEPARTMENT_OTHER): Payer: Medicare (Managed Care) | Attending: Internal Medicine

## 2017-04-25 DIAGNOSIS — M069 Rheumatoid arthritis, unspecified: Secondary | ICD-10-CM | POA: Diagnosis not present

## 2017-04-25 DIAGNOSIS — E1140 Type 2 diabetes mellitus with diabetic neuropathy, unspecified: Secondary | ICD-10-CM | POA: Insufficient documentation

## 2017-04-25 DIAGNOSIS — L97812 Non-pressure chronic ulcer of other part of right lower leg with fat layer exposed: Secondary | ICD-10-CM | POA: Diagnosis not present

## 2017-04-25 DIAGNOSIS — F17210 Nicotine dependence, cigarettes, uncomplicated: Secondary | ICD-10-CM | POA: Diagnosis not present

## 2017-04-25 DIAGNOSIS — E1151 Type 2 diabetes mellitus with diabetic peripheral angiopathy without gangrene: Secondary | ICD-10-CM | POA: Diagnosis not present

## 2017-04-25 DIAGNOSIS — I10 Essential (primary) hypertension: Secondary | ICD-10-CM | POA: Insufficient documentation

## 2017-04-25 DIAGNOSIS — E11621 Type 2 diabetes mellitus with foot ulcer: Secondary | ICD-10-CM | POA: Insufficient documentation

## 2017-04-25 DIAGNOSIS — Z89511 Acquired absence of right leg below knee: Secondary | ICD-10-CM | POA: Insufficient documentation

## 2017-04-25 DIAGNOSIS — J449 Chronic obstructive pulmonary disease, unspecified: Secondary | ICD-10-CM | POA: Diagnosis not present

## 2017-04-28 ENCOUNTER — Ambulatory Visit (INDEPENDENT_AMBULATORY_CARE_PROVIDER_SITE_OTHER): Payer: Medicare (Managed Care) | Admitting: Orthopedic Surgery

## 2017-04-29 ENCOUNTER — Ambulatory Visit: Payer: Medicare (Managed Care) | Admitting: Cardiovascular Disease

## 2017-05-01 ENCOUNTER — Other Ambulatory Visit (HOSPITAL_COMMUNITY): Payer: Self-pay | Admitting: Family Medicine

## 2017-05-01 ENCOUNTER — Ambulatory Visit (HOSPITAL_COMMUNITY)
Admission: RE | Admit: 2017-05-01 | Discharge: 2017-05-01 | Disposition: A | Discharge status: Home / Self Care | Payer: Medicare (Managed Care) | Source: Ambulatory Visit | Attending: Cardiovascular Disease | Admitting: Cardiovascular Disease

## 2017-05-01 DIAGNOSIS — I739 Peripheral vascular disease, unspecified: Secondary | ICD-10-CM | POA: Diagnosis present

## 2017-05-01 DIAGNOSIS — Z89511 Acquired absence of right leg below knee: Secondary | ICD-10-CM | POA: Diagnosis not present

## 2017-05-01 DIAGNOSIS — L97819 Non-pressure chronic ulcer of other part of right lower leg with unspecified severity: Secondary | ICD-10-CM | POA: Diagnosis not present

## 2017-05-01 DIAGNOSIS — I82431 Acute embolism and thrombosis of right popliteal vein: Secondary | ICD-10-CM | POA: Diagnosis not present

## 2017-05-02 ENCOUNTER — Encounter: Payer: Self-pay | Admitting: Cardiovascular Disease

## 2017-05-02 ENCOUNTER — Ambulatory Visit (INDEPENDENT_AMBULATORY_CARE_PROVIDER_SITE_OTHER): Payer: Medicare (Managed Care) | Admitting: Cardiovascular Disease

## 2017-05-02 VITALS — BP 153/81 | HR 92 | Wt 180.0 lb

## 2017-05-02 DIAGNOSIS — R0989 Other specified symptoms and signs involving the circulatory and respiratory systems: Secondary | ICD-10-CM

## 2017-05-02 DIAGNOSIS — I998 Other disorder of circulatory system: Secondary | ICD-10-CM | POA: Diagnosis not present

## 2017-05-02 DIAGNOSIS — I70229 Atherosclerosis of native arteries of extremities with rest pain, unspecified extremity: Secondary | ICD-10-CM

## 2017-05-02 NOTE — Patient Instructions (Signed)
   [facility] CARDIOVASCULAR DIVISION [facility] [address] [place] [address] Dept: [phone] Loc: [phone]  Brian Zavala  05/02/2017  You are scheduled for a Peripheral Angiogram on Monday, March 4 with Dr. Nanetta Batty.  1. Please arrive at the [facility] (Main Entrance A) at [facility]: [address] [place], [place] [address] at 7:30 AM (two hours before your procedure to ensure your preparation). Free valet parking service is available.   Special note: Every effort is made to have your procedure done on time. Please understand that emergencies sometimes delay scheduled procedures.  2. Diet: Do not eat or drink anything after midnight prior to your procedure except sips of water to take medications.  3. Labs: Please have labs drawn in our office today.  4. Medication instructions in preparation for your procedure:  Stop taking, Glucophage (Metformin) on Sunday, March 3.    Take only 30 units of insulin the night before your procedure. Do not take any insulin on the day of the procedure.  On the morning of your procedure, take your Aspirin and any morning medicines NOT listed above.  You may use sips of water.  5. Plan for one night stay--bring personal belongings. 6. Bring a current list of your medications and current insurance cards. 7. You MUST have a responsible person to drive you home. 8. Someone MUST be with you the first 24 hours after you arrive home or your discharge will be delayed. 9. Please wear clothes that are easy to get on and off and wear slip-on shoes.  Thank you for allowing [place] to care for you!   -- [facility]   Post-procedure Follow-up:  1 week after angio: Your physician has requested that you have a lower extremity arterial duplex. During this test, ultrasound is used to evaluate arterial blood flow in the legs. Allow one hour for this  exam. There are no restrictions or special instructions.  Your physician has requested that you have an ankle brachial index (ABI). During this test an ultrasound and blood pressure cuff are used to evaluate the arteries that supply the arms and legs with blood. Allow thirty minutes for this exam. There are no restrictions or special instructions.  Your physician recommends that you schedule a follow-up appointment in: 2 weeks with Dr. Allyson Sabal after angio.

## 2017-05-02 NOTE — Assessment & Plan Note (Signed)
Brian Zavala was referred for evaluation of critical limb ischemia. He had a right BKA by Dr. Lajoyce Corners in 2005. He has been wearing a prosthesis since. He developed a nonhealing ulcer on his right stump back in July. He currently is not wearing a prosthesis because of this and is wheelchair-bound. He had lower extremity arterial Doppler studies performed in our office yesterday which revealed an occluded right SFA and popliteal artery. He saw Dr. Lajoyce Corners who is planning on doing a right AKA which I suspect would not heal. I am going to arrange for him to undergo angiography by myself at [facility] Monday.

## 2017-05-02 NOTE — Progress Notes (Signed)
05/02/2017 Brian Zavala   Oct 21, 1950  [id number]  Primary Physician Brian Bastos, MD Primary Cardiologist: Brian Gess MD Brian Zavala, [place]  HPI:  Brian Zavala is a 67 y.o. 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 He was referred by Dr. Lajoyce Zavala for peripheral vascular evaluation because of critical limb ischemia. He has a history of 75-pack-years of tobacco abuse currently smoking 1-1/2 packs a day as well as 2 hypertension, diabetes and hyperlipidemia. He has never had a heart attack or stroke. 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 In June of last year he developed leg stump ulcer which has not healed. Dopplers performed in our office yesterday showed an occluded right SFA.    Current Meds  Medication Sig  . aspirin 81 MG chewable tablet Chew 81 mg by mouth daily.  Marland Kitchen atorvastatin (LIPITOR) 10 MG tablet Take 10 mg by mouth at bedtime.   . Calcium Carbonate-Vitamin D (CALCIUM + D PO) Take 1 tablet by mouth daily.   . cholecalciferol (VITAMIN D) 1000 UNITS tablet Take 1,000 Units by mouth daily.  Marland Kitchen docusate sodium (COLACE) 100 MG capsule Take 100 mg by mouth 2 (two) times daily.  Marland Kitchen etanercept (ENBREL) 50 MG/ML injection Inject 50 mg into the skin every 7 (seven) days. Wednesdays  . furosemide (LASIX) 20 MG tablet Take 20 mg by mouth daily.  Marland Kitchen glimepiride (AMARYL) 4 MG tablet Take 4 mg by mouth daily.  . hydrochlorothiazide (MICROZIDE) 12.5 MG capsule Take 12.5 mg by mouth daily.  . hydrocortisone 2.5 % cream Apply 1 application topically 2 (two) times daily.  Marland Kitchen ibuprofen (ADVIL,MOTRIN) 200 MG tablet Take 200 mg by mouth every 4 (four) hours as needed for moderate pain.  Marland Kitchen insulin glargine (LANTUS) 100 UNIT/ML injection Inject 60 Units into the skin every evening.   . leflunomide (ARAVA) 20 MG tablet Take 20 mg by mouth daily.  . Liraglutide (VICTOZA) 18 MG/3ML SOLN injection Inject 1.8 mg into the  skin every evening.   . metFORMIN (GLUCOPHAGE) 1000 MG tablet Take 1,000 mg by mouth 2 (two) times daily.  Marland Kitchen morphine (MS CONTIN) 100 MG 12 hr tablet Take 100 mg by mouth every 12 (twelve) hours.  . mupirocin ointment (BACTROBAN) 2 % Place 1 application into the nose 2 (two) times daily.  . Nutritional Supplements (PROMOD) LIQD Take 30 mLs by mouth 2 (two) times daily.  Marland Kitchen oxyCODONE-acetaminophen (PERCOCET/ROXICET) 5-325 MG tablet Take 1 tablet by mouth every 4 (four) hours as needed for severe pain.  . pentoxifylline (TRENTAL) 400 MG CR tablet Take 1 tablet (400 mg total) by mouth 3 (three) times daily with meals.  . predniSONE (DELTASONE) 1 MG tablet Take 2 mg by mouth daily with breakfast.  . pregabalin (LYRICA) 75 MG capsule Take 75-150 mg by mouth See admin instructions. Take 75 mg in the morning and 150 mg in the evening  . Skin Protectants, Misc. (EUCERIN) cream Apply 1 application topically 2 (two) times daily.  . Sodium Fluoride (PREVIDENT 5000 PLUS DT) Place 1 application onto teeth every evening.  . sulfamethoxazole-trimethoprim (BACTRIM DS,SEPTRA DS) 800-160 MG tablet Take 1 tablet by mouth 2 (two) times daily.  . vitamin B-12 (CYANOCOBALAMIN) 100 MCG tablet Take 100 mcg by mouth daily.     Allergies  Allergen Reactions  . Varenicline Other (See Comments)    Upset stomach    Social History  Socioeconomic History  . Marital status: Divorced    Spouse name: Not on file  . Number of children: Not on file  . Years of education: Not on file  . Highest education level: Not on file  Social Needs  . Financial resource strain: Not on file  . Food insecurity - worry: Not on file  . Food insecurity - inability: Not on file  . Transportation needs - medical: Not on file  . Transportation needs - non-medical: Not on file  Occupational History  . Not on file  Tobacco Use  . Smoking status: Current Every Day Smoker    Packs/day: 1.00    Years: 47.00    Pack years: 47.00     Types: Cigarettes  . Smokeless tobacco: Never Used  Substance and Sexual Activity  . Alcohol use: No  . Drug use: No  . Sexual activity: No  Other Topics Concern  . Not on file  Social History Narrative  . Not on file     Review of Systems: General: negative for chills, fever, night sweats or weight changes.  Cardiovascular: negative for chest pain, dyspnea on exertion, edema, orthopnea, palpitations, paroxysmal nocturnal dyspnea or shortness of breath Dermatological: negative for rash Respiratory: negative for cough or wheezing Urologic: negative for hematuria Abdominal: negative for nausea, vomiting, diarrhea, bright red blood per rectum, melena, or hematemesis Neurologic: negative for visual changes, syncope, or dizziness All other systems reviewed and are otherwise negative except as noted above.    Blood pressure (!) 153/81, pulse 92, weight 180 lb (81.6 kg).  General appearance: alert and no distress Neck: no adenopathy, no JVD, supple, symmetrical, trachea midline, thyroid not enlarged, symmetric, no tenderness/mass/nodules and Bilateral carotid bruits left greater than right Lungs: clear to auscultation bilaterally Heart: regular rate and rhythm, S1, S2 normal, no murmur, click, rub or gallop Extremities: Right BKA with a stump ulcer Pulses: Absent pedal pulses Skin: Right stump ulcer Neurologic: Alert and oriented X 3, normal strength and tone. Normal symmetric reflexes. Normal coordination and gait  EKG not performed today  ASSESSMENT AND PLAN:   Critical lower limb ischemia [name] was referred for evaluation of critical limb ischemia. 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 He has been wearing a prosthesis since. He developed a nonhealing ulcer on his right stump back in July. He currently is not wearing a prosthesis because of this and is wheelchair-bound. He had lower extremity arterial Doppler studies performed in our office yesterday which revealed an  occluded right SFA and popliteal artery. He saw Dr. Lajoyce Zavala who is planning on doing a right AKA which I suspect would not heal. I am going to arrange for him to undergo angiography by myself at tone [facility] Monday.      Brian Gess MD FACP,FACC,FAHA, [facility] 05/02/2017 12:58 PM

## 2017-05-02 NOTE — Addendum Note (Signed)
Addended by: Evans Lance on: 05/02/2017 05:01 PM   Modules accepted: Orders, SmartSet

## 2017-05-03 LAB — CBC WITH DIFFERENTIAL/PLATELET
Basophils Absolute: 0 10*3/uL (ref 0.0–0.2)
Basos: 1 %
EOS (ABSOLUTE): 0.2 10*3/uL (ref 0.0–0.4)
Eos: 3 %
Hematocrit: 43.3 % (ref 37.5–51.0)
Hemoglobin: 14.9 g/dL (ref 13.0–17.7)
Immature Grans (Abs): 0 10*3/uL (ref 0.0–0.1)
Immature Granulocytes: 1 %
Lymphocytes Absolute: 1.4 10*3/uL (ref 0.7–3.1)
Lymphs: 18 %
MCH: 29.4 pg (ref 26.6–33.0)
MCHC: 34.4 g/dL (ref 31.5–35.7)
MCV: 85 fL (ref 79–97)
Monocytes Absolute: 0.4 10*3/uL (ref 0.1–0.9)
Monocytes: 5 %
Neutrophils Absolute: 5.9 10*3/uL (ref 1.4–7.0)
Neutrophils: 72 %
Platelets: 235 10*3/uL (ref 150–379)
RBC: 5.07 x10E6/uL (ref 4.14–5.80)
RDW: 14.6 % (ref 12.3–15.4)
WBC: 8.1 10*3/uL (ref 3.4–10.8)

## 2017-05-03 LAB — BASIC METABOLIC PANEL
BUN/Creatinine Ratio: 10 (ref 10–24)
BUN: 8 mg/dL (ref 8–27)
CO2: 23 mmol/L (ref 20–29)
Calcium: 9.2 mg/dL (ref 8.6–10.2)
Chloride: 93 mmol/L — ABNORMAL LOW (ref 96–106)
Creatinine, Ser: 0.78 mg/dL (ref 0.76–1.27)
GFR calc Af Amer: 109 mL/min/{1.73_m2} (ref >59)
GFR calc non Af Amer: 94 mL/min/{1.73_m2} (ref >59)
Glucose: 266 mg/dL — ABNORMAL HIGH (ref 65–99)
Potassium: 4.2 mmol/L (ref 3.5–5.2)
Sodium: 138 mmol/L (ref 134–144)

## 2017-05-03 LAB — TSH: TSH: 1.76 u[IU]/mL (ref 0.450–4.500)

## 2017-05-03 LAB — PROTIME-INR
INR: 1 (ref 0.8–1.2)
Prothrombin Time: 10 s (ref 9.1–12.0)

## 2017-05-03 LAB — APTT: aPTT: 27 s (ref 24–33)

## 2017-05-05 ENCOUNTER — Ambulatory Visit (HOSPITAL_COMMUNITY)
Admission: RE | Admit: 2017-05-05 | Discharge: 2017-05-06 | Disposition: A | Discharge status: Home / Self Care | Payer: Medicare (Managed Care) | Source: Ambulatory Visit | Attending: Cardiovascular Disease | Admitting: Cardiovascular Disease

## 2017-05-05 ENCOUNTER — Encounter (HOSPITAL_COMMUNITY)
Admission: RE | Disposition: A | Discharge status: Home / Self Care | Payer: Self-pay | Source: Ambulatory Visit | Attending: Cardiovascular Disease

## 2017-05-05 DIAGNOSIS — F17210 Nicotine dependence, cigarettes, uncomplicated: Secondary | ICD-10-CM | POA: Diagnosis not present

## 2017-05-05 DIAGNOSIS — E663 Overweight: Secondary | ICD-10-CM | POA: Diagnosis not present

## 2017-05-05 DIAGNOSIS — Y835 Amputation of limb(s) as the cause of abnormal reaction of the patient, or of later complication, without mention of misadventure at the time of the procedure: Secondary | ICD-10-CM | POA: Insufficient documentation

## 2017-05-05 DIAGNOSIS — T8189XA Other complications of procedures, not elsewhere classified, initial encounter: Secondary | ICD-10-CM | POA: Diagnosis not present

## 2017-05-05 DIAGNOSIS — Z6823 Body mass index (BMI) 23.0-23.9, adult: Secondary | ICD-10-CM | POA: Insufficient documentation

## 2017-05-05 DIAGNOSIS — I7092 Chronic total occlusion of artery of the extremities: Secondary | ICD-10-CM | POA: Insufficient documentation

## 2017-05-05 DIAGNOSIS — E1151 Type 2 diabetes mellitus with diabetic peripheral angiopathy without gangrene: Secondary | ICD-10-CM | POA: Insufficient documentation

## 2017-05-05 DIAGNOSIS — I70238 Atherosclerosis of native arteries of right leg with ulceration of other part of lower right leg: Secondary | ICD-10-CM | POA: Diagnosis not present

## 2017-05-05 DIAGNOSIS — I998 Other disorder of circulatory system: Secondary | ICD-10-CM | POA: Diagnosis present

## 2017-05-05 DIAGNOSIS — Z993 Dependence on wheelchair: Secondary | ICD-10-CM | POA: Diagnosis not present

## 2017-05-05 DIAGNOSIS — Z7902 Long term (current) use of antithrombotics/antiplatelets: Secondary | ICD-10-CM | POA: Insufficient documentation

## 2017-05-05 DIAGNOSIS — Z716 Tobacco abuse counseling: Secondary | ICD-10-CM

## 2017-05-05 DIAGNOSIS — I10 Essential (primary) hypertension: Secondary | ICD-10-CM | POA: Diagnosis not present

## 2017-05-05 DIAGNOSIS — E785 Hyperlipidemia, unspecified: Secondary | ICD-10-CM | POA: Diagnosis not present

## 2017-05-05 DIAGNOSIS — I70229 Atherosclerosis of native arteries of extremities with rest pain, unspecified extremity: Secondary | ICD-10-CM | POA: Diagnosis present

## 2017-05-05 HISTORY — PX: PERIPHERAL VASCULAR INTERVENTION: CATH118257

## 2017-05-05 HISTORY — PX: LOWER EXTREMITY ANGIOGRAPHY: CATH118251

## 2017-05-05 LAB — POCT ACTIVATED CLOTTING TIME
Activated Clotting Time: 257 seconds
Activated Clotting Time: 230 seconds
Activated Clotting Time: 191 seconds
Activated Clotting Time: 219 seconds
Activated Clotting Time: 175 seconds

## 2017-05-05 LAB — GLUCOSE, CAPILLARY
Glucose-Capillary: 243 mg/dL — ABNORMAL HIGH (ref 65–99)
Glucose-Capillary: 186 mg/dL — ABNORMAL HIGH (ref 65–99)
Glucose-Capillary: 145 mg/dL — ABNORMAL HIGH (ref 65–99)
Glucose-Capillary: 288 mg/dL — ABNORMAL HIGH (ref 65–99)
Glucose-Capillary: 255 mg/dL — ABNORMAL HIGH (ref 65–99)

## 2017-05-05 SURGERY — LOWER EXTREMITY ANGIOGRAPHY
Anesthesia: LOCAL | Laterality: Right

## 2017-05-05 MED ORDER — SODIUM CHLORIDE 0.9 % IV SOLN: 250.0000 mL | INTRAVENOUS | Status: DC | PRN

## 2017-05-05 MED ORDER — ONDANSETRON HCL 4 MG/2ML IJ SOLN: 4.0000 mg | Freq: Four times a day (QID) | INTRAMUSCULAR | Status: DC | PRN

## 2017-05-05 MED ORDER — CLOPIDOGREL BISULFATE 300 MG PO TABS
ORAL_TABLET | ORAL | Status: DC | PRN
  Administered 2017-05-05: 300 mg via ORAL

## 2017-05-05 MED ORDER — ASPIRIN EC 81 MG PO TBEC: 81.0000 mg | DELAYED_RELEASE_TABLET | Freq: Every day | ORAL | Status: DC

## 2017-05-05 MED ORDER — SODIUM CHLORIDE 0.9 % WEIGHT BASED INFUSION
3.0000 mL/kg/h | INTRAVENOUS | Status: DC
  Administered 2017-05-05: 3 mL/kg/h via INTRAVENOUS

## 2017-05-05 MED ORDER — LABETALOL HCL 5 MG/ML IV SOLN: 10.0000 mg | INTRAVENOUS | Status: DC | PRN

## 2017-05-05 MED ORDER — SODIUM CHLORIDE 0.9% FLUSH: 3.0000 mL | INTRAVENOUS | Status: DC | PRN

## 2017-05-05 MED ORDER — MORPHINE SULFATE (PF) 10 MG/ML IV SOLN
2.0000 mg | INTRAVENOUS | Status: DC | PRN
  Administered 2017-05-05: 2 mg via INTRAVENOUS

## 2017-05-05 MED ORDER — HEPARIN SODIUM (PORCINE) 1000 UNIT/ML IJ SOLN
INTRAMUSCULAR | Status: DC | PRN
  Administered 2017-05-05: 7500 [IU] via INTRAVENOUS
  Administered 2017-05-05: 3000 [IU] via INTRAVENOUS

## 2017-05-05 MED ORDER — ACETAMINOPHEN 325 MG PO TABS: 650.0000 mg | ORAL_TABLET | ORAL | Status: DC | PRN

## 2017-05-05 MED ORDER — MIDAZOLAM HCL 2 MG/2ML IJ SOLN
INTRAMUSCULAR | Status: DC | PRN
  Administered 2017-05-05 (×2): 1 mg via INTRAVENOUS

## 2017-05-05 MED ORDER — HEPARIN (PORCINE) IN NACL 2-0.9 UNIT/ML-% IJ SOLN
INTRAMUSCULAR | Status: AC
  Filled 2017-05-05: qty 1000

## 2017-05-05 MED ORDER — MORPHINE SULFATE (PF) 4 MG/ML IV SOLN
2.0000 mg | INTRAVENOUS | Status: DC | PRN
  Filled 2017-05-05: qty 1

## 2017-05-05 MED ORDER — ASPIRIN 81 MG PO CHEW
81.0000 mg | CHEWABLE_TABLET | Freq: Every day | ORAL | Status: DC
  Administered 2017-05-06: 81 mg via ORAL
  Filled 2017-05-05: qty 1

## 2017-05-05 MED ORDER — PREDNISONE 1 MG PO TABS
2.0000 mg | ORAL_TABLET | Freq: Every day | ORAL | Status: DC
  Administered 2017-05-06: 07:00:00 2 mg via ORAL
  Filled 2017-05-05: qty 2

## 2017-05-05 MED ORDER — PREGABALIN 25 MG PO CAPS
75.0000 mg | ORAL_CAPSULE | Freq: Every morning | ORAL | Status: DC
  Administered 2017-05-06: 09:00:00 75 mg via ORAL
  Filled 2017-05-05: qty 3

## 2017-05-05 MED ORDER — SODIUM CHLORIDE 0.9 % WEIGHT BASED INFUSION: 1.0000 mL/kg/h | INTRAVENOUS | Status: DC

## 2017-05-05 MED ORDER — MIDAZOLAM HCL 2 MG/2ML IJ SOLN
INTRAMUSCULAR | Status: AC
  Filled 2017-05-05: qty 2

## 2017-05-05 MED ORDER — MORPHINE SULFATE (PF) 4 MG/ML IV SOLN
INTRAVENOUS | Status: AC
  Filled 2017-05-05: qty 1

## 2017-05-05 MED ORDER — FENTANYL CITRATE (PF) 100 MCG/2ML IJ SOLN
INTRAMUSCULAR | Status: AC
  Filled 2017-05-05: qty 2

## 2017-05-05 MED ORDER — HEPARIN SODIUM (PORCINE) 1000 UNIT/ML IJ SOLN
INTRAMUSCULAR | Status: AC
  Filled 2017-05-05: qty 1

## 2017-05-05 MED ORDER — LIDOCAINE HCL (PF) 1 % IJ SOLN
INTRAMUSCULAR | Status: DC | PRN
  Administered 2017-05-05: 30 mL via INTRADERMAL

## 2017-05-05 MED ORDER — HYDRALAZINE HCL 20 MG/ML IJ SOLN: 5.0000 mg | INTRAMUSCULAR | Status: DC | PRN

## 2017-05-05 MED ORDER — SODIUM CHLORIDE 0.9% FLUSH: 3.0000 mL | Freq: Two times a day (BID) | INTRAVENOUS | Status: DC

## 2017-05-05 MED ORDER — GLIMEPIRIDE 4 MG PO TABS
4.0000 mg | ORAL_TABLET | Freq: Every day | ORAL | Status: DC
  Administered 2017-05-05 – 2017-05-06 (×2): 4 mg via ORAL
  Filled 2017-05-05 (×2): qty 1

## 2017-05-05 MED ORDER — HYDROCERIN EX CREA
1.0000 "application " | TOPICAL_CREAM | Freq: Two times a day (BID) | CUTANEOUS | Status: DC
  Administered 2017-05-05 – 2017-05-06 (×2): 1 via TOPICAL
  Filled 2017-05-05: qty 113

## 2017-05-05 MED ORDER — MORPHINE SULFATE ER 100 MG PO TBCR
100.0000 mg | EXTENDED_RELEASE_TABLET | Freq: Two times a day (BID) | ORAL | Status: DC
  Administered 2017-05-05 – 2017-05-06 (×2): 100 mg via ORAL
  Filled 2017-05-05 (×2): qty 1

## 2017-05-05 MED ORDER — FUROSEMIDE 20 MG PO TABS
20.0000 mg | ORAL_TABLET | Freq: Every day | ORAL | Status: DC
  Administered 2017-05-05: 17:00:00 20 mg via ORAL
  Filled 2017-05-05 (×2): qty 1

## 2017-05-05 MED ORDER — CLOPIDOGREL BISULFATE 300 MG PO TABS
ORAL_TABLET | ORAL | Status: AC
  Filled 2017-05-05: qty 1

## 2017-05-05 MED ORDER — HYDROCHLOROTHIAZIDE 12.5 MG PO CAPS
12.5000 mg | ORAL_CAPSULE | Freq: Every day | ORAL | Status: DC
  Administered 2017-05-05 – 2017-05-06 (×2): 12.5 mg via ORAL
  Filled 2017-05-05 (×2): qty 1

## 2017-05-05 MED ORDER — ASPIRIN 81 MG PO CHEW
81.0000 mg | CHEWABLE_TABLET | ORAL | Status: AC
  Administered 2017-05-05: 81 mg via ORAL

## 2017-05-05 MED ORDER — INSULIN ASPART 100 UNIT/ML ~~LOC~~ SOLN
8.0000 [IU] | Freq: Once | SUBCUTANEOUS | Status: AC
  Administered 2017-05-05: 8 [IU] via SUBCUTANEOUS

## 2017-05-05 MED ORDER — ASPIRIN 81 MG PO CHEW
CHEWABLE_TABLET | ORAL | Status: AC
  Filled 2017-05-05: qty 1

## 2017-05-05 MED ORDER — ATORVASTATIN CALCIUM 80 MG PO TABS
80.0000 mg | ORAL_TABLET | Freq: Every day | ORAL | Status: DC
  Administered 2017-05-05: 19:00:00 80 mg via ORAL
  Filled 2017-05-05: qty 1

## 2017-05-05 MED ORDER — FENTANYL CITRATE (PF) 100 MCG/2ML IJ SOLN
INTRAMUSCULAR | Status: DC | PRN
  Administered 2017-05-05 (×4): 25 ug via INTRAVENOUS

## 2017-05-05 MED ORDER — PREGABALIN 75 MG PO CAPS: 75.0000 mg | ORAL_CAPSULE | Freq: Two times a day (BID) | ORAL | Status: DC

## 2017-05-05 MED ORDER — HEPARIN (PORCINE) IN NACL 2-0.9 UNIT/ML-% IJ SOLN
INTRAMUSCULAR | Status: DC | PRN
  Administered 2017-05-05 (×2): 500 mL via INTRA_ARTERIAL

## 2017-05-05 MED ORDER — PREGABALIN 25 MG PO CAPS
150.0000 mg | ORAL_CAPSULE | Freq: Every evening | ORAL | Status: DC
  Administered 2017-05-05: 19:00:00 150 mg via ORAL
  Filled 2017-05-05: qty 6

## 2017-05-05 MED ORDER — IODIXANOL 320 MG/ML IV SOLN
INTRAVENOUS | Status: DC | PRN
  Administered 2017-05-05: 265 mL via INTRA_ARTERIAL

## 2017-05-05 MED ORDER — CLOPIDOGREL BISULFATE 75 MG PO TABS
75.0000 mg | ORAL_TABLET | Freq: Every day | ORAL | Status: DC
  Administered 2017-05-06: 07:00:00 75 mg via ORAL
  Filled 2017-05-05: qty 1

## 2017-05-05 MED ORDER — ATORVASTATIN CALCIUM 10 MG PO TABS: 10.0000 mg | ORAL_TABLET | Freq: Every day | ORAL | Status: DC

## 2017-05-05 MED ORDER — LIDOCAINE HCL 1 % IJ SOLN
INTRAMUSCULAR | Status: AC
  Filled 2017-05-05: qty 20

## 2017-05-05 MED ORDER — INSULIN ASPART 100 UNIT/ML ~~LOC~~ SOLN
0.0000 [IU] | Freq: Three times a day (TID) | SUBCUTANEOUS | Status: DC
  Administered 2017-05-05: 19:00:00 8 [IU] via SUBCUTANEOUS

## 2017-05-05 MED ORDER — SODIUM CHLORIDE 0.9 % IV SOLN: INTRAVENOUS | Status: AC

## 2017-05-05 SURGICAL SUPPLY — 35 items
BAG SNAP BAND KOVER 36X36 (MISCELLANEOUS) ×3 IMPLANT
BALLN ADMIRAL INPACT 5X200 (BALLOONS) ×3
BALLN COYOTE OTW 2.5X220X150 (BALLOONS) ×3
BALLN IN.PACT DCB 5X150 (BALLOONS) ×3
BALLOON ADMIRAL INPACT 5X200 (BALLOONS) ×2 IMPLANT
BALLOON COYOTE OTW 2.5X220X150 (BALLOONS) ×2 IMPLANT
CATH ANGIO 5F PIGTAIL 65CM (CATHETERS) ×3 IMPLANT
CATH HAWKONE LX EXTENDED TIP (CATHETERS) ×3 IMPLANT
CATH NAVICROSS ANGLED 135CM (MICROCATHETER) ×3 IMPLANT
CATH STRAIGHT 5FR 65CM (CATHETERS) ×3 IMPLANT
CATH TEMPO 5F RIM 65CM (CATHETERS) ×3 IMPLANT
CATH VIANCE CROSS STAND 150CM (MICROCATHETER) ×3
CATH VIANCE CROSS STD 150CM (MICROCATHETER) ×2 IMPLANT
DCB IN.PACT 5X150 (BALLOONS) ×2 IMPLANT
DEVICE CONTINUOUS FLUSH (MISCELLANEOUS) ×3 IMPLANT
DEVICE TORQUE .014-.018 (MISCELLANEOUS) ×2 IMPLANT
GUIDEWIRE ASTATO XS 20G 300CM (WIRE) ×3 IMPLANT
GUIDEWIRE LT ZIPWIRE 035X260 (WIRE) ×3 IMPLANT
KIT ENCORE 26 ADVANTAGE (KITS) ×3 IMPLANT
KIT ESSENTIALS PG (KITS) ×3 IMPLANT
KIT PV (KITS) ×3 IMPLANT
SHEATH HIGHFLEX ANSEL 7FR 55CM (SHEATH) ×3 IMPLANT
SHEATH PINNACLE 5F 10CM (SHEATH) ×3 IMPLANT
SHEATH PINNACLE 7F 10CM (SHEATH) ×3 IMPLANT
STOPCOCK MORSE 400PSI 3WAY (MISCELLANEOUS) ×3 IMPLANT
SYRINGE MEDRAD AVANTA MACH 7 (SYRINGE) ×3 IMPLANT
TAPE VIPERTRACK RADIOPAQ (MISCELLANEOUS) ×2 IMPLANT
TAPE VIPERTRACK RADIOPAQUE (MISCELLANEOUS) ×1
TORQUE DEVICE .014-.018 (MISCELLANEOUS) ×3
TRANSDUCER W/STOPCOCK (MISCELLANEOUS) ×3 IMPLANT
TRAY PV CATH (CUSTOM PROCEDURE TRAY) ×3 IMPLANT
TUBING CIL FLEX 10 FLL-RA (TUBING) ×3 IMPLANT
WIRE HITORQ VERSACORE ST 145CM (WIRE) ×3 IMPLANT
WIRE ROSEN-J .035X180CM (WIRE) ×3 IMPLANT
WIRE SPARTACORE .014X300CM (WIRE) ×6 IMPLANT

## 2017-05-05 NOTE — Discharge Summary (Addendum)
Discharge Summary    Patient ID: Brian Zavala,  MRN: [id number], DOB/AGE: Jan 23, 1951 67 y.o.  Admit date: 05/05/2017 Discharge date: 05/06/2017  Primary Care Provider: Jethro Bastos Primary Cardiologist: Dr. Allyson Sabal   Discharge Diagnoses    Active Problems:   Critical lower limb ischemia   Tobacco abuse counseling   Allergies Allergies  Allergen Reactions  . Varenicline Other (See Comments)    Upset stomach    Diagnostic Studies/Procedures    PV angiogram: 05/05/17  Angiographic Data:   1: Abdominal aorta-widely patent 2: Left lower extremity-minimal irregularities with three-vessel runoff 3: Right lower extremity-occluded right SFA the origin with reconstitution in the adductor canal by profunda femoris collaterals. The popliteal was occluded as well. The patient did have a right BKA.  Final Impression: Successful Hawk 1 directional atherectomy followed by drug eluting branch of plasty of a total right SFA the setting of critical limb ischemia with a BKA stump ischemic ulcer. The patient received 300 mg of by mouth Plavix. The sheath will be removed once a below 170 and pressure held. He'll be hydrated overnight. Hopefully this will limit the level of amputation or promote healing of his stump wound. Dr. Lajoyce Corners I was notified of these results.  Nanetta Batty. MD, [facility] _____________   History of Present Illness     67 y.o. male who was referred by Dr. Lajoyce Corners for peripheral vascular evaluation because of critical limb ischemia. He has a history of 75-pack-years of tobacco abuse currently smoking 1-1/2 packs a day as well as 2 hypertension, diabetes and hyperlipidemia. He has never had a heart attack or stroke. He had a right BKA by in 2005. In June of last year he developed leg stump ulcer which has not healed. Dopplers performed in our office showed an occluded right SFA. He currently is not wearing a prosthesis because of this and is wheelchair-bound. He saw Dr. Lajoyce Corners who  is planning on doing a right AKA which Dr. Allyson Sabal felt would not heal. Given this he was set up for outpatient PV angiogram with possible intervention.   Hospital Course     Underwent PV angiogram noted above with successful [facility] atherectomy with drug eluting angioplasty of the right SFA. Loaded with plavix in the lab. Plan for DAPT with ASA/plavix. Morning labs were stable. No complications noted, but did have area of ecchymosis at cath site. Added high dose statin, and asked that he stop his home Trental given the addition of ASA/plavix.   General: Well developed, well nourished, male appearing in no acute distress. Head: Normocephalic, atraumatic.  Neck: Supple, no JVD. Lungs:  Resp regular and unlabored, CTA. Heart: RRR, S1, S2, no S3, S4, or murmur; no rub. Abdomen: Soft, non-tender, non-distended with normoactive bowel sounds. No hepatomegaly. No rebound/guarding. No obvious abdominal masses. Extremities: No clubbing, cyanosis, edema. Right BKA. L femoral cath site stable with bruising but no hematoma Neuro: Alert and oriented X 3. Moves all extremities spontaneously. Psych: Normal affect.  [name] was seen by Dr. Tresa Endo and determined stable for discharge home. Follow up in the office has been arranged. Medications are listed below.   _____________  Discharge Vitals Blood pressure 133/63, pulse 97, temperature 98.4 F (36.9 C), temperature source Oral, resp. rate 19, weight 180 lb 12.4 oz (82 kg), SpO2 96 %.  Filed Weights   05/06/17 0310  Weight: 180 lb 12.4 oz (82 kg)    Labs & Radiologic Studies    CBC Recent Labs  05/06/17 0350  WBC 10.2  HGB 11.6*  HCT 35.5*  MCV 88.8  PLT 283   Basic Metabolic Panel Recent Labs    40/98/11 0350  NA 136  K 3.8  CL 97*  CO2 29  GLUCOSE 184*  BUN 8  CREATININE 0.91  CALCIUM 8.2*   Liver Function Tests No results for input(s): AST, ALT, ALKPHOS, BILITOT, PROT, ALBUMIN in the last 72 hours. No results for  input(s): LIPASE, AMYLASE in the last 72 hours. Cardiac Enzymes No results for input(s): CKTOTAL, CKMB, CKMBINDEX, TROPONINI in the last 72 hours. BNP Invalid input(s): POCBNP D-Dimer No results for input(s): DDIMER in the last 72 hours. Hemoglobin A1C No results for input(s): HGBA1C in the last 72 hours. Fasting Lipid Panel No results for input(s): CHOL, HDL, LDLCALC, TRIG, CHOLHDL, LDLDIRECT in the last 72 hours. Thyroid Function Tests No results for input(s): TSH, T4TOTAL, T3FREE, THYROIDAB in the last 72 hours.  Invalid input(s): FREET3 _____________  No results found. Disposition   Pt is being discharged home today in good condition.  Follow-up Plans & Appointments    Follow-up Information    [facility] Follow up on 05/13/2017.   Specialty:  Cardiology Why:  at 1:30pm for your follow up dopplers.  Contact information: [address] [place] [address] [phone]       [name], MD Follow up on 05/21/2017.   Specialties:  Cardiology, Radiology Why:  at 10:15am for your follow up appt.  Contact information: [address] [place] [address] [phone]          Discharge Instructions    Call MD for:  redness, tenderness, or signs of infection (pain, swelling, redness, odor or green/yellow discharge around incision site)   Complete by:  As directed    Diet - low sodium heart healthy   Complete by:  As directed    Discharge instructions   Complete by:  As directed    Groin Site Care Refer to this sheet in the next few weeks. These instructions provide you with information on caring for yourself after your procedure. Your caregiver may also give you more specific instructions. Your treatment has been planned according to current medical practices, but problems sometimes occur. Call your caregiver if you have any problems or questions after your procedure. HOME CARE INSTRUCTIONS You may shower  24 hours after the procedure. Remove the bandage (dressing) and gently wash the site with plain soap and water. Gently pat the site dry.  Do not apply powder or lotion to the site.  Do not sit in a bathtub, swimming pool, or whirlpool for 5 to 7 days.  No bending, squatting, or lifting anything over 10 pounds (4.5 kg) as directed by your caregiver.  Inspect the site at least twice daily.  Do not drive home if you are discharged the same day of the procedure. Have someone else drive you.  You may drive 24 hours after the procedure unless otherwise instructed by your caregiver.  What to expect: Any bruising will usually fade within 1 to 2 weeks.  Blood that collects in the tissue (hematoma) may be painful to the touch. It should usually decrease in size and tenderness within 1 to 2 weeks.  SEEK IMMEDIATE MEDICAL CARE IF: You have unusual pain at the groin site or down the affected leg.  You have redness, warmth, swelling, or pain at the groin site.  You have drainage (other than a small  amount of blood on the dressing).  You have chills.  You have a fever or persistent symptoms for more than 72 hours.  You have a fever and your symptoms suddenly get worse.  Your leg becomes pale, cool, tingly, or numb.  You have heavy bleeding from the site. Hold pressure on the site. .  We have added low dose aspirin along with plavix. Please stop the trental as you are now on 2 other blood thinning medications. Please keep follow up appts.   Increase activity slowly   Complete by:  As directed       Discharge Medications     Medication List    STOP taking these medications   aspirin 81 MG chewable tablet   ibuprofen 200 MG tablet Commonly known as:  ADVIL,MOTRIN   oxyCODONE-acetaminophen 5-325 MG tablet Commonly known as:  PERCOCET/ROXICET   pentoxifylline 400 MG CR tablet Commonly known as:  TRENTAL   sulfamethoxazole-trimethoprim 800-160 MG tablet Commonly known as:  BACTRIM DS,SEPTRA  DS     TAKE these medications   atorvastatin 80 MG tablet Commonly known as:  LIPITOR Take 1 tablet (80 mg total) by mouth daily at 6 PM. What changed:    medication strength  how much to take  when to take this Notes to patient:  Lowers cholesterol    CALCIUM + D PO Take 1 tablet by mouth daily. Notes to patient:  Supplement    cholecalciferol 1000 units tablet Commonly known as:  VITAMIN D Take 1,000 Units by mouth daily. Notes to patient:  Supplement    clopidogrel 75 MG tablet Commonly known as:  PLAVIX Take 1 tablet (75 mg total) by mouth daily with breakfast. Start taking on:  05/07/2017 Notes to patient:  Prevents clotting in the artery    docusate sodium 100 MG capsule Commonly known as:  COLACE Take 100 mg by mouth 2 (two) times daily. Notes to patient:  Stool softener    etanercept 50 MG/ML injection Commonly known as:  ENBREL Inject 50 mg into the skin every 7 (seven) days. Wednesdays   eucerin cream Apply 1 application topically 2 (two) times daily.   furosemide 20 MG tablet Commonly known as:  LASIX Take 20 mg by mouth daily. Notes to patient:  Fluid medication   glimepiride 4 MG tablet Commonly known as:  AMARYL Take 4 mg by mouth daily. Notes to patient:  Lowers blood sugar   hydrochlorothiazide 12.5 MG capsule Commonly known as:  MICROZIDE Take 12.5 mg by mouth daily. Notes to patient:  Lowers blood pressure Weak fluid medication    insulin glargine 100 UNIT/ML injection Commonly known as:  LANTUS Inject 60 Units into the skin every evening. Notes to patient:  Lowers blood pressure   leflunomide 20 MG tablet Commonly known as:  ARAVA Take 20 mg by mouth daily.   metFORMIN 1000 MG tablet Commonly known as:  GLUCOPHAGE Take 1,000 mg by mouth 2 (two) times daily. Notes to patient:  Lowers blood sugar Holding this medication for 48 hours after dye was given will prevent kidney injury   morphine 100 MG 12 hr tablet Commonly known as:   MS CONTIN Take 100 mg by mouth every 12 (twelve) hours.   mupirocin ointment 2 % Commonly known as:  BACTROBAN Apply 1 application topically daily.   predniSONE 1 MG tablet Commonly known as:  DELTASONE Take 2 mg by mouth daily with breakfast.   pregabalin 75 MG capsule Commonly known as:  LYRICA Take  75-150 mg by mouth 2 (two) times daily. Take 75 mg in the morning and 150 mg in the evening Notes to patient:  Prevents nerve pain    VICTOZA 18 MG/3ML Soln injection Generic drug:  Liraglutide Inject 1.8 mg into the skin every evening. Notes to patient:  Lowers blood sugar   vitamin B-12 100 MCG tablet Commonly known as:  CYANOCOBALAMIN Take 100 mcg by mouth daily. Notes to patient:  Supplement         Outstanding Labs/Studies   Follow dopplers. FLP/LFTs in 6 weeks if tolerating statin.   Duration of Discharge Encounter   Greater than 30 minutes including physician time.  Signed, Laverda Page NP-C 05/06/2017, 10:08 AM    Patient seen and examined. Agree with assessment and plan. .  No chest pain.  Tolerated successful Hawk atherectomy withdrug-eluting angioplasty of the right SFA.  Large area of ecchymosis at the left groin site.  Good pulses.  I discussed the importance of absolute complete smoking cessation.  The patient is now on dual antiplatelet therapy with aspirin and Plavix.  Plan to discharge today. Will have follow-up Dopplers and office visit evaluation with Dr. Allyson Sabal.   Lennette Bihari, MD, [facility] 05/06/2017 10:08 AM

## 2017-05-05 NOTE — Progress Notes (Addendum)
Site area: LFA Site Prior to Removal:  Level 1-golf ball size hematoma seen upon removal of dressing Pressure Applied For: total 60 min Manual:   yes Patient Status During Pull: stable  Post Pull Site:  Level  Post Pull Instructions Given:  yes Post Pull Pulses Present: doppler Dressing Applied:  tegaderm Bedrest begins @ 1415 till 2015 Comments: relieved by Cristy Friedlander at 35 min Pt with back and groin pain

## 2017-05-06 ENCOUNTER — Encounter (HOSPITAL_COMMUNITY): Payer: Self-pay | Admitting: Cardiovascular Disease

## 2017-05-06 DIAGNOSIS — E1151 Type 2 diabetes mellitus with diabetic peripheral angiopathy without gangrene: Secondary | ICD-10-CM | POA: Diagnosis not present

## 2017-05-06 DIAGNOSIS — I998 Other disorder of circulatory system: Secondary | ICD-10-CM

## 2017-05-06 DIAGNOSIS — Z716 Tobacco abuse counseling: Secondary | ICD-10-CM

## 2017-05-06 LAB — CBC
HCT: 35.5 % — ABNORMAL LOW (ref 39.0–52.0)
Hemoglobin: 11.6 g/dL — ABNORMAL LOW (ref 13.0–17.0)
MCH: 29 pg (ref 26.0–34.0)
MCHC: 32.7 g/dL (ref 30.0–36.0)
MCV: 88.8 fL (ref 78.0–100.0)
Platelets: 283 10*3/uL (ref 150–400)
RBC: 4 MIL/uL — ABNORMAL LOW (ref 4.22–5.81)
RDW: 14.1 % (ref 11.5–15.5)
WBC: 10.2 10*3/uL (ref 4.0–10.5)

## 2017-05-06 LAB — GLUCOSE, CAPILLARY: Glucose-Capillary: 192 mg/dL — ABNORMAL HIGH (ref 65–99)

## 2017-05-06 LAB — BASIC METABOLIC PANEL
Anion gap: 10 (ref 5–15)
BUN: 8 mg/dL (ref 6–20)
CO2: 29 mmol/L (ref 22–32)
Calcium: 8.2 mg/dL — ABNORMAL LOW (ref 8.9–10.3)
Chloride: 97 mmol/L — ABNORMAL LOW (ref 101–111)
Creatinine, Ser: 0.91 mg/dL (ref 0.61–1.24)
GFR calc Af Amer: >60 mL/min (ref >60)
GFR calc non Af Amer: >60 mL/min (ref >60)
Glucose, Bld: 184 mg/dL — ABNORMAL HIGH (ref 65–99)
Potassium: 3.8 mmol/L (ref 3.5–5.1)
Sodium: 136 mmol/L (ref 135–145)

## 2017-05-06 MED ORDER — ANGIOPLASTY BOOK
Freq: Once | Status: AC
  Administered 2017-05-06: 04:00:00
  Filled 2017-05-06: qty 1

## 2017-05-06 MED ORDER — ATORVASTATIN CALCIUM 80 MG PO TABS: 80.0000 mg | ORAL_TABLET | Freq: Every day | ORAL | 2 refills | Status: AC

## 2017-05-06 MED ORDER — CLOPIDOGREL BISULFATE 75 MG PO TABS: 75.0000 mg | ORAL_TABLET | Freq: Every day | ORAL | 2 refills | Status: DC

## 2017-05-06 NOTE — Progress Notes (Signed)
Inpatient Diabetes Program Recommendations  AACE/ADA: New Consensus Statement on Inpatient Glycemic Control (2015)  Target Ranges:  Prepandial:   less than 140 mg/dL      Peak postprandial:   less than 180 mg/dL (1-2 hours)      Critically ill patients:  140 - 180 mg/dL   Lab Results  Component Value Date   GLUCAP 192 (H) 05/06/2017   HGBA1C 7.6 (H) 03/07/2017    Review of Glycemic Control Results for Brian Zavala, Brian Zavala (MRN [id number]) as of 05/06/2017 09:50  Ref. Range 05/05/2017 15:01 05/05/2017 18:05 05/05/2017 21:04 05/06/2017 06:29  Glucose-Capillary Latest Ref Range: 65 - 99 mg/dL 449 (H) 675 (H) 916 (H) 192 (H)   Diabetes history: Type 2 DM Outpatient Diabetes medications: Amaryl 4 mg QD, Lantus 60 Units in AM, Victoza 1.8 mg QD, Metformin 1,000 mg BID Current orders for Inpatient glycemic control: Amaryl 4 mg QD, Novolog 0-15 Units [facility]  Inpatient Diabetes Program Recommendations:    Noted Prednisone 2 mg QD. Consider restarting Lantus 30 Units QD (half of patient's home dose).   Also, if post prandials are continuing to be >180 mg/dL, consider starting meal coverage: Novolog 4 units TIDAC (when patient consumes >50%).  Thanks, Lujean Rave, MSN, RNC-OB Diabetes Coordinator [phone] (8a-5p)

## 2017-05-13 ENCOUNTER — Ambulatory Visit (HOSPITAL_COMMUNITY)
Admission: RE | Admit: 2017-05-13 | Discharge: 2017-05-13 | Disposition: A | Discharge status: Home / Self Care | Payer: Medicare (Managed Care) | Source: Ambulatory Visit | Attending: Cardiovascular Disease | Admitting: Cardiovascular Disease

## 2017-05-13 ENCOUNTER — Other Ambulatory Visit: Payer: Self-pay | Admitting: Cardiovascular Disease

## 2017-05-13 DIAGNOSIS — I70291 Other atherosclerosis of native arteries of extremities, right leg: Secondary | ICD-10-CM | POA: Diagnosis not present

## 2017-05-13 DIAGNOSIS — R0989 Other specified symptoms and signs involving the circulatory and respiratory systems: Secondary | ICD-10-CM

## 2017-05-13 DIAGNOSIS — I6523 Occlusion and stenosis of bilateral carotid arteries: Secondary | ICD-10-CM | POA: Insufficient documentation

## 2017-05-13 DIAGNOSIS — I70229 Atherosclerosis of native arteries of extremities with rest pain, unspecified extremity: Secondary | ICD-10-CM

## 2017-05-13 DIAGNOSIS — I998 Other disorder of circulatory system: Secondary | ICD-10-CM | POA: Diagnosis present

## 2017-05-19 ENCOUNTER — Telehealth: Payer: Self-pay

## 2017-05-19 DIAGNOSIS — I70229 Atherosclerosis of native arteries of extremities with rest pain, unspecified extremity: Secondary | ICD-10-CM

## 2017-05-19 DIAGNOSIS — I998 Other disorder of circulatory system: Secondary | ICD-10-CM

## 2017-05-19 NOTE — Telephone Encounter (Signed)
Patient called.  Patient aware. Future order entered

## 2017-05-21 ENCOUNTER — Ambulatory Visit (INDEPENDENT_AMBULATORY_CARE_PROVIDER_SITE_OTHER): Payer: Medicare (Managed Care) | Admitting: Cardiovascular Disease

## 2017-05-21 ENCOUNTER — Ambulatory Visit: Payer: Medicare (Managed Care) | Admitting: Cardiovascular Disease

## 2017-05-21 ENCOUNTER — Encounter: Payer: Self-pay | Admitting: Cardiovascular Disease

## 2017-05-21 DIAGNOSIS — I998 Other disorder of circulatory system: Secondary | ICD-10-CM

## 2017-05-21 DIAGNOSIS — I70229 Atherosclerosis of native arteries of extremities with rest pain, unspecified extremity: Secondary | ICD-10-CM

## 2017-05-21 NOTE — Progress Notes (Signed)
05/21/2017 Brian Zavala   1950-07-26  [id number]  Primary Physician Jethro Bastos, MD Primary Cardiologist: [name] MD Nicholes Calamity, [place]  HPI:  Brian Zavala is a 67 y.o.  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 He was referred by Dr. Lajoyce Corners for peripheral vascular evaluation because of critical limb ischemia. He has a history of 75-pack-years of tobacco abuse currently smoking 1-1/2 packs a day as well as 2 hypertension, diabetes and hyperlipidemia. He has never had a heart attack or stroke. 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 In June of last year he developed leg stump ulcer which has not healed. Dopplers performed in our office yesterday showed an occluded right SFA.. I performed angiography on him 05/05/17 revealing an occluded right SFA from the origin down the adductor canal which I was able to revascularize using directional atherectomy with drug-eluting balloon angioplasty. Did have an occluded popliteal as well. Follow-up lower extremity arterial Doppler studies performed 05/14/17 showed marked improvement. He did also stop smoking at that time.     Current Meds  Medication Sig  . aspirin 81 MG chewable tablet Chew 81 mg by mouth daily.  Marland Kitchen atorvastatin (LIPITOR) 80 MG tablet Take 1 tablet (80 mg total) by mouth daily at 6 PM.  . Calcium Carbonate-Vitamin D (CALCIUM + D PO) Take 1 tablet by mouth daily.   . cholecalciferol (VITAMIN D) 1000 UNITS tablet Take 1,000 Units by mouth daily.  . clopidogrel (PLAVIX) 75 MG tablet Take 1 tablet (75 mg total) by mouth daily with breakfast.  . docusate sodium (COLACE) 100 MG capsule Take 100 mg by mouth 2 (two) times daily.  Marland Kitchen etanercept (ENBREL) 50 MG/ML injection Inject 50 mg into the skin every 7 (seven) days. Wednesdays  . furosemide (LASIX) 20 MG tablet Take 20 mg by mouth daily.  Marland Kitchen glimepiride (AMARYL) 4 MG tablet Take 4 mg by mouth daily.    . hydrochlorothiazide (MICROZIDE) 12.5 MG capsule Take 12.5 mg by mouth daily.  . insulin glargine (LANTUS) 100 UNIT/ML injection Inject 60 Units into the skin every evening.   . leflunomide (ARAVA) 20 MG tablet Take 20 mg by mouth daily.  . Liraglutide (VICTOZA) 18 MG/3ML SOLN injection Inject 1.8 mg into the skin every evening.   . metFORMIN (GLUCOPHAGE) 1000 MG tablet Take 1,000 mg by mouth 2 (two) times daily.  Marland Kitchen morphine (MS CONTIN) 100 MG 12 hr tablet Take 100 mg by mouth every 12 (twelve) hours.  . predniSONE (DELTASONE) 1 MG tablet Take 2 mg by mouth daily with breakfast.  . pregabalin (LYRICA) 75 MG capsule Take 75-150 mg by mouth 2 (two) times daily. Take 75 mg in the morning and 150 mg in the evening  . Skin Protectants, Misc. (EUCERIN) cream Apply 1 application topically 2 (two) times daily.  . vitamin B-12 (CYANOCOBALAMIN) 100 MCG tablet Take 100 mcg by mouth daily.     Allergies  Allergen Reactions  . Varenicline Other (See Comments)    Upset stomach    Social History   Socioeconomic History  . Marital status: Divorced    Spouse name: Not on file  . Number of children: Not on file  . Years of education: Not on file  . Highest education level: Not on file  Social Needs  . Financial resource strain: Not on file  . Food insecurity - worry: Not on file  .  Food insecurity - inability: Not on file  . Transportation needs - medical: Not on file  . Transportation needs - non-medical: Not on file  Occupational History  . Not on file  Tobacco Use  . Smoking status: Current Every Day Smoker    Packs/day: 1.00    Years: 47.00    Pack years: 47.00    Types: Cigarettes  . Smokeless tobacco: Never Used  Substance and Sexual Activity  . Alcohol use: No  . Drug use: No  . Sexual activity: No  Other Topics Concern  . Not on file  Social History Narrative  . Not on file     Review of Systems: General: negative for chills, fever, night sweats or weight changes.   Cardiovascular: negative for chest pain, dyspnea on exertion, edema, orthopnea, palpitations, paroxysmal nocturnal dyspnea or shortness of breath Dermatological: negative for rash Respiratory: negative for cough or wheezing Urologic: negative for hematuria Abdominal: negative for nausea, vomiting, diarrhea, bright red blood per rectum, melena, or hematemesis Neurologic: negative for visual changes, syncope, or dizziness All other systems reviewed and are otherwise negative except as noted above.    Blood pressure 95/66, pulse 88, height 6\' 1"  (1.854 m), weight 180 lb (81.6 kg).  General appearance: alert and no distress Neck: no adenopathy, no JVD, supple, symmetrical, trachea midline, thyroid not enlarged, symmetric, no tenderness/mass/nodules and left carotid bruit Lungs: clear to auscultation bilaterally Heart: regular rate and rhythm, S1, S2 normal, no murmur, click, rub or gallop Extremities: extremities normal, atraumatic, no cyanosis or edema and right BKA, ischemic ulcer on stump Pulses: right BKA, palpable left pedal pulses Skin: ischemic ulcer right BKA stump Neurologic: Alert and oriented X 3, normal strength and tone. Normal symmetric reflexes. Normal coordination and gait  EKG not performed today  ASSESSMENT AND PLAN:   Critical lower limb ischemia History of critical limb ischemia with a wound on his right BKA stump. He was referred by Dr. for vascular evaluation. I angiogram him 3/4/19revealed an occluded SFA from the origin down to the adductor canal which I was able to open up using directional atherectomy and drug-eluting balloon angioplasty. He does have an occluded popliteal artery as well. His post procedure Dopplers performed 05/14/17 were markedly improved. I'm going to have him return to see Dr. 05/16/17 ASAP for aggressive wound care hopefully to avoid another higher amputation.      Leanord Hawking MD FACP,FACC,FAHA, [facility] 05/21/2017 11:07 AM

## 2017-05-21 NOTE — Patient Instructions (Signed)
Medication Instructions: Your physician recommends that you continue on your current medications as directed. Please refer to the Current Medication list given to you today.  Follow-Up: Your physician recommends that you schedule a follow-up appointment in: 2 months with [name].  If you need a refill on your cardiac medications before your next appointment, please call your pharmacy.  

## 2017-05-21 NOTE — Assessment & Plan Note (Signed)
History of critical limb ischemia with a wound on his right BKA stump. He was referred by Dr. Leanord Hawking for vascular evaluation. I angiogram him 3/4/19revealed an occluded SFA from the origin down to the adductor canal which I was able to open up using directional atherectomy and drug-eluting balloon angioplasty. He does have an occluded popliteal artery as well. His post procedure Dopplers performed 05/14/17 were markedly improved. I'm going to have him return to see Dr. Leanord Hawking ASAP for aggressive wound care hopefully to avoid another higher amputation.

## 2017-05-22 ENCOUNTER — Encounter (HOSPITAL_BASED_OUTPATIENT_CLINIC_OR_DEPARTMENT_OTHER): Payer: Medicare (Managed Care) | Attending: Internal Medicine

## 2017-05-22 DIAGNOSIS — I70238 Atherosclerosis of native arteries of right leg with ulceration of other part of lower right leg: Secondary | ICD-10-CM | POA: Insufficient documentation

## 2017-05-22 DIAGNOSIS — L97816 Non-pressure chronic ulcer of other part of right lower leg with bone involvement without evidence of necrosis: Secondary | ICD-10-CM | POA: Diagnosis not present

## 2017-05-22 DIAGNOSIS — E11622 Type 2 diabetes mellitus with other skin ulcer: Secondary | ICD-10-CM | POA: Diagnosis present

## 2017-05-22 DIAGNOSIS — E1151 Type 2 diabetes mellitus with diabetic peripheral angiopathy without gangrene: Secondary | ICD-10-CM | POA: Insufficient documentation

## 2017-05-22 DIAGNOSIS — E1140 Type 2 diabetes mellitus with diabetic neuropathy, unspecified: Secondary | ICD-10-CM | POA: Diagnosis not present

## 2017-05-22 DIAGNOSIS — F17210 Nicotine dependence, cigarettes, uncomplicated: Secondary | ICD-10-CM | POA: Insufficient documentation

## 2017-05-22 DIAGNOSIS — Z89511 Acquired absence of right leg below knee: Secondary | ICD-10-CM | POA: Insufficient documentation

## 2017-05-23 ENCOUNTER — Telehealth: Payer: Self-pay

## 2017-05-23 DIAGNOSIS — R0989 Other specified symptoms and signs involving the circulatory and respiratory systems: Secondary | ICD-10-CM

## 2017-05-23 DIAGNOSIS — I6523 Occlusion and stenosis of bilateral carotid arteries: Secondary | ICD-10-CM

## 2017-05-23 NOTE — Telephone Encounter (Signed)
Future carotid ultrasound entered 05-2018

## 2017-05-29 DIAGNOSIS — E11622 Type 2 diabetes mellitus with other skin ulcer: Secondary | ICD-10-CM | POA: Diagnosis not present

## 2017-06-05 ENCOUNTER — Encounter (HOSPITAL_BASED_OUTPATIENT_CLINIC_OR_DEPARTMENT_OTHER): Payer: Medicare (Managed Care) | Attending: Internal Medicine

## 2017-06-05 DIAGNOSIS — L988 Other specified disorders of the skin and subcutaneous tissue: Secondary | ICD-10-CM | POA: Insufficient documentation

## 2017-06-05 DIAGNOSIS — I10 Essential (primary) hypertension: Secondary | ICD-10-CM | POA: Insufficient documentation

## 2017-06-05 DIAGNOSIS — E11622 Type 2 diabetes mellitus with other skin ulcer: Secondary | ICD-10-CM | POA: Diagnosis not present

## 2017-06-05 DIAGNOSIS — M069 Rheumatoid arthritis, unspecified: Secondary | ICD-10-CM | POA: Diagnosis not present

## 2017-06-05 DIAGNOSIS — J449 Chronic obstructive pulmonary disease, unspecified: Secondary | ICD-10-CM | POA: Diagnosis not present

## 2017-06-05 DIAGNOSIS — Z89511 Acquired absence of right leg below knee: Secondary | ICD-10-CM | POA: Insufficient documentation

## 2017-06-05 DIAGNOSIS — Z95820 Peripheral vascular angioplasty status with implants and grafts: Secondary | ICD-10-CM | POA: Diagnosis not present

## 2017-06-05 DIAGNOSIS — E1151 Type 2 diabetes mellitus with diabetic peripheral angiopathy without gangrene: Secondary | ICD-10-CM | POA: Insufficient documentation

## 2017-06-05 DIAGNOSIS — E1140 Type 2 diabetes mellitus with diabetic neuropathy, unspecified: Secondary | ICD-10-CM | POA: Diagnosis not present

## 2017-06-05 DIAGNOSIS — L97816 Non-pressure chronic ulcer of other part of right lower leg with bone involvement without evidence of necrosis: Secondary | ICD-10-CM | POA: Diagnosis not present

## 2017-06-05 DIAGNOSIS — B9561 Methicillin susceptible Staphylococcus aureus infection as the cause of diseases classified elsewhere: Secondary | ICD-10-CM | POA: Insufficient documentation

## 2017-06-13 DIAGNOSIS — E11622 Type 2 diabetes mellitus with other skin ulcer: Secondary | ICD-10-CM | POA: Diagnosis not present

## 2017-06-20 ENCOUNTER — Other Ambulatory Visit (HOSPITAL_COMMUNITY)
Admission: RE | Admit: 2017-06-20 | Discharge: 2017-06-20 | Disposition: A | Discharge status: Home / Self Care | Payer: Medicare (Managed Care) | Source: Other Acute Inpatient Hospital | Attending: Internal Medicine | Admitting: Internal Medicine

## 2017-06-20 DIAGNOSIS — L97213 Non-pressure chronic ulcer of right calf with necrosis of muscle: Secondary | ICD-10-CM | POA: Insufficient documentation

## 2017-06-20 DIAGNOSIS — E11622 Type 2 diabetes mellitus with other skin ulcer: Secondary | ICD-10-CM | POA: Diagnosis not present

## 2017-06-23 LAB — AEROBIC CULTURE W GRAM STAIN (SUPERFICIAL SPECIMEN)

## 2017-06-27 ENCOUNTER — Other Ambulatory Visit (HOSPITAL_BASED_OUTPATIENT_CLINIC_OR_DEPARTMENT_OTHER): Payer: Self-pay | Admitting: Internal Medicine

## 2017-06-27 ENCOUNTER — Ambulatory Visit (HOSPITAL_COMMUNITY)
Admission: RE | Admit: 2017-06-27 | Discharge: 2017-06-27 | Disposition: A | Discharge status: Home / Self Care | Payer: Medicare (Managed Care) | Source: Ambulatory Visit | Attending: Internal Medicine | Admitting: Internal Medicine

## 2017-06-27 ENCOUNTER — Other Ambulatory Visit (HOSPITAL_COMMUNITY)
Admission: RE | Admit: 2017-06-27 | Discharge: 2017-06-27 | Disposition: A | Discharge status: Home / Self Care | Payer: Medicare (Managed Care) | Source: Other Acute Inpatient Hospital | Attending: Internal Medicine | Admitting: Internal Medicine

## 2017-06-27 DIAGNOSIS — Z89511 Acquired absence of right leg below knee: Secondary | ICD-10-CM | POA: Insufficient documentation

## 2017-06-27 DIAGNOSIS — M869 Osteomyelitis, unspecified: Secondary | ICD-10-CM

## 2017-06-27 DIAGNOSIS — I739 Peripheral vascular disease, unspecified: Secondary | ICD-10-CM | POA: Insufficient documentation

## 2017-06-27 DIAGNOSIS — L97213 Non-pressure chronic ulcer of right calf with necrosis of muscle: Secondary | ICD-10-CM | POA: Diagnosis present

## 2017-06-27 DIAGNOSIS — E11622 Type 2 diabetes mellitus with other skin ulcer: Secondary | ICD-10-CM | POA: Diagnosis not present

## 2017-06-30 LAB — AEROBIC CULTURE W GRAM STAIN (SUPERFICIAL SPECIMEN)

## 2017-07-02 ENCOUNTER — Ambulatory Visit (HOSPITAL_COMMUNITY)
Admission: RE | Admit: 2017-07-02 | Discharge: 2017-07-02 | Disposition: A | Discharge status: Home / Self Care | Payer: Medicare (Managed Care) | Source: Ambulatory Visit | Attending: Internal Medicine | Admitting: Internal Medicine

## 2017-07-02 DIAGNOSIS — Z9889 Other specified postprocedural states: Secondary | ICD-10-CM | POA: Insufficient documentation

## 2017-07-02 DIAGNOSIS — I998 Other disorder of circulatory system: Secondary | ICD-10-CM | POA: Diagnosis not present

## 2017-07-02 DIAGNOSIS — I771 Stricture of artery: Secondary | ICD-10-CM | POA: Insufficient documentation

## 2017-07-02 DIAGNOSIS — I70229 Atherosclerosis of native arteries of extremities with rest pain, unspecified extremity: Secondary | ICD-10-CM

## 2017-07-04 ENCOUNTER — Other Ambulatory Visit (HOSPITAL_BASED_OUTPATIENT_CLINIC_OR_DEPARTMENT_OTHER): Payer: Self-pay | Admitting: Internal Medicine

## 2017-07-04 ENCOUNTER — Encounter (HOSPITAL_BASED_OUTPATIENT_CLINIC_OR_DEPARTMENT_OTHER): Payer: Medicare (Managed Care) | Attending: Internal Medicine

## 2017-07-04 ENCOUNTER — Other Ambulatory Visit (HOSPITAL_COMMUNITY)
Admission: RE | Admit: 2017-07-04 | Discharge: 2017-07-04 | Disposition: A | Discharge status: Home / Self Care | Payer: Medicare (Managed Care) | Source: Other Acute Inpatient Hospital | Attending: Internal Medicine | Admitting: Internal Medicine

## 2017-07-04 DIAGNOSIS — L97816 Non-pressure chronic ulcer of other part of right lower leg with bone involvement without evidence of necrosis: Secondary | ICD-10-CM | POA: Diagnosis not present

## 2017-07-04 DIAGNOSIS — I10 Essential (primary) hypertension: Secondary | ICD-10-CM | POA: Insufficient documentation

## 2017-07-04 DIAGNOSIS — M86161 Other acute osteomyelitis, right tibia and fibula: Secondary | ICD-10-CM | POA: Diagnosis not present

## 2017-07-04 DIAGNOSIS — E1140 Type 2 diabetes mellitus with diabetic neuropathy, unspecified: Secondary | ICD-10-CM | POA: Insufficient documentation

## 2017-07-04 DIAGNOSIS — J449 Chronic obstructive pulmonary disease, unspecified: Secondary | ICD-10-CM | POA: Insufficient documentation

## 2017-07-04 DIAGNOSIS — E1169 Type 2 diabetes mellitus with other specified complication: Secondary | ICD-10-CM | POA: Insufficient documentation

## 2017-07-04 DIAGNOSIS — E1151 Type 2 diabetes mellitus with diabetic peripheral angiopathy without gangrene: Secondary | ICD-10-CM | POA: Insufficient documentation

## 2017-07-04 DIAGNOSIS — B9561 Methicillin susceptible Staphylococcus aureus infection as the cause of diseases classified elsewhere: Secondary | ICD-10-CM | POA: Diagnosis not present

## 2017-07-04 DIAGNOSIS — E11622 Type 2 diabetes mellitus with other skin ulcer: Secondary | ICD-10-CM | POA: Insufficient documentation

## 2017-07-04 DIAGNOSIS — Z89511 Acquired absence of right leg below knee: Secondary | ICD-10-CM | POA: Insufficient documentation

## 2017-07-04 DIAGNOSIS — L97213 Non-pressure chronic ulcer of right calf with necrosis of muscle: Secondary | ICD-10-CM | POA: Diagnosis present

## 2017-07-10 LAB — AEROBIC/ANAEROBIC CULTURE W GRAM STAIN (SURGICAL/DEEP WOUND)

## 2017-07-11 DIAGNOSIS — E11622 Type 2 diabetes mellitus with other skin ulcer: Secondary | ICD-10-CM | POA: Diagnosis not present

## 2017-07-11 LAB — GLUCOSE, CAPILLARY: Glucose-Capillary: 265 mg/dL — ABNORMAL HIGH (ref 65–99)

## 2017-07-16 ENCOUNTER — Encounter: Payer: Self-pay | Admitting: Cardiovascular Disease

## 2017-07-16 ENCOUNTER — Ambulatory Visit (INDEPENDENT_AMBULATORY_CARE_PROVIDER_SITE_OTHER): Payer: Medicare (Managed Care) | Admitting: Cardiovascular Disease

## 2017-07-16 DIAGNOSIS — I70229 Atherosclerosis of native arteries of extremities with rest pain, unspecified extremity: Secondary | ICD-10-CM

## 2017-07-16 DIAGNOSIS — I998 Other disorder of circulatory system: Secondary | ICD-10-CM

## 2017-07-16 DIAGNOSIS — Z716 Tobacco abuse counseling: Secondary | ICD-10-CM

## 2017-07-16 NOTE — Assessment & Plan Note (Signed)
Discontinued 1 month ago. 

## 2017-07-16 NOTE — Progress Notes (Signed)
07/16/2017 Brian Zavala   [birth year]  [id number]  Primary Physician Brian Bastos, MD Primary Cardiologist: Brian Gess MD Brian Zavala, Brian Zavala  HPI:  Brian Zavala is a 67 y.o.  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 He was referred by Dr. Lajoyce Zavala for peripheral vascular evaluation because of critical limb ischemia.  I last saw him in the office 05/21/2017.  He has a history of 75-pack-years of tobacco abuse currently smoking 1-1/2 packs a day as well as 2 hypertension, diabetes and hyperlipidemia. He has never had a heart attack or stroke. 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 In June of last year he developed leg stump ulcer which has not healed. Dopplers performed in our office yesterday showed an occluded right SFA.. I performed angiography on him 05/05/17 revealing an occluded right SFA from the origin down the adductor canal which I was able to revascularize using directional atherectomy with drug-eluting balloon angioplasty. Did have an occluded popliteal as well. Follow-up lower extremity arterial Doppler studies performed 05/14/17 showed marked improvement. He did also stop smoking at that time.  He saw him 2 months ago his ulcer has had no improvement by Dopplers that show a widely patent right SFA.  Is scheduled to undergo hyperbaric therapy which I think is a good option.  Short of improvement with these measures he may require right AKA.      Current Meds  Medication Sig  . aspirin 81 MG chewable tablet Chew 81 mg by mouth daily.  Marland Kitchen atorvastatin (LIPITOR) 80 MG tablet Take 1 tablet (80 mg total) by mouth daily at 6 PM.  . Calcium Carbonate-Vitamin D (CALCIUM + D PO) Take 1 tablet by mouth daily.   . cholecalciferol (VITAMIN D) 1000 UNITS tablet Take 1,000 Units by mouth daily.  . clopidogrel (PLAVIX) 75 MG tablet Take 1 tablet (75 mg total) by mouth daily with breakfast.  . docusate sodium  (COLACE) 100 MG capsule Take 100 mg by mouth 2 (two) times daily.  Marland Kitchen etanercept (ENBREL) 50 MG/ML injection Inject 50 mg into the skin every 7 (seven) days. Wednesdays  . furosemide (LASIX) 20 MG tablet Take 20 mg by mouth daily.  Marland Kitchen glimepiride (AMARYL) 4 MG tablet Take 4 mg by mouth daily.  . hydrochlorothiazide (MICROZIDE) 12.5 MG capsule Take 12.5 mg by mouth daily.  . insulin glargine (LANTUS) 100 UNIT/ML injection Inject 60 Units into the skin every evening.   . leflunomide (ARAVA) 20 MG tablet Take 20 mg by mouth daily.  . Liraglutide (VICTOZA) 18 MG/3ML SOLN injection Inject 1.8 mg into the skin every evening.   . metFORMIN (GLUCOPHAGE) 1000 MG tablet Take 1,000 mg by mouth 2 (two) times daily.  Marland Kitchen morphine (MS CONTIN) 100 MG 12 hr tablet Take 100 mg by mouth every 12 (twelve) hours.  . predniSONE (DELTASONE) 1 MG tablet Take 2 mg by mouth daily with breakfast.  . pregabalin (LYRICA) 75 MG capsule Take 75-150 mg by mouth 2 (two) times daily. Take 75 mg in the morning and 150 mg in the evening  . Skin Protectants, Misc. (EUCERIN) cream Apply 1 application topically 2 (two) times daily.  . vitamin B-12 (CYANOCOBALAMIN) 100 MCG tablet Take 100 mcg by mouth daily.     Allergies  Allergen Reactions  . Varenicline Other (See Comments)    Upset stomach    Social History   Socioeconomic  History  . Marital status: Divorced    Spouse name: Not on file  . Number of children: Not on file  . Years of education: Not on file  . Highest education level: Not on file  Occupational History  . Not on file  Social Needs  . Financial resource strain: Not on file  . Food insecurity:    Worry: Not on file    Inability: Not on file  . Transportation needs:    Medical: Not on file    Non-medical: Not on file  Tobacco Use  . Smoking status: Current Every Day Smoker    Packs/day: 1.00    Years: 47.00    Pack years: 47.00    Types: Cigarettes  . Smokeless tobacco: Never Used  Substance and  Sexual Activity  . Alcohol use: No  . Drug use: No  . Sexual activity: Never  Lifestyle  . Physical activity:    Days per week: Not on file    Minutes per session: Not on file  . Stress: Not on file  Relationships  . Social connections:    Talks on phone: Not on file    Gets together: Not on file    Attends religious service: Not on file    Active member of club or organization: Not on file    Attends meetings of clubs or organizations: Not on file    Relationship status: Not on file  . Intimate partner violence:    Fear of current or ex partner: Not on file    Emotionally abused: Not on file    Physically abused: Not on file    Forced sexual activity: Not on file  Other Topics Concern  . Not on file  Social History Narrative  . Not on file     Review of Systems: General: negative for chills, fever, night sweats or weight changes.  Cardiovascular: negative for chest pain, dyspnea on exertion, edema, orthopnea, palpitations, paroxysmal nocturnal dyspnea or shortness of breath Dermatological: negative for rash Respiratory: negative for cough or wheezing Urologic: negative for hematuria Abdominal: negative for nausea, vomiting, diarrhea, bright red blood per rectum, melena, or hematemesis Neurologic: negative for visual changes, syncope, or dizziness All other systems reviewed and are otherwise negative except as noted above.    Blood pressure 128/72, pulse 96, height 5\' 11"  (1.803 m), weight 185 lb (83.9 kg).  General appearance: alert and no distress Neck: no adenopathy, no JVD, supple, symmetrical, trachea midline, thyroid not enlarged, symmetric, no tenderness/mass/nodules and Soft bilateral carotid bruits Lungs: clear to auscultation bilaterally Heart: regular rate and rhythm, S1, S2 normal, no murmur, click, rub or gallop Extremities: Right BKA, absent pedal pulses. Pulses: Absent pedal pulses Skin: Changed ulcer right BKA stump Neurologic: Alert and oriented X 3,  normal strength and tone. Normal symmetric reflexes. Normal coordination and gait  EKG Not performed today  ASSESSMENT AND PLAN:   Critical lower limb ischemia History of critical limb ischemia status post right BKA by Dr. Lajoyce Zavala in  2005.  He has developed a stump ulcer because this underwent peripheral angiography by myself/4/19 revealing an occluded right SFA which I performed directional atherectomy and drug-eluting balloon angioplasty on.  He does have an occluded right popliteal artery.  Unfortunately, over the last 3 months he has had very little improvement in his ischemic ulcer.  He may benefit from hyperbaric therapy.  If his wound does not improve he may ultimately require a right above-the-knee amputation.  Tobacco abuse counseling Discontinued  1 month ago.      Brian Gess MD FACP,FACC,FAHA, [facility] 07/16/2017 10:34 AM

## 2017-07-16 NOTE — Patient Instructions (Signed)

## 2017-07-16 NOTE — Assessment & Plan Note (Signed)
History of critical limb ischemia status post right BKA by Dr. Lajoyce Corners in  2005.  He has developed a stump ulcer because this underwent peripheral angiography by myself/4/19 revealing an occluded right SFA which I performed directional atherectomy and drug-eluting balloon angioplasty on.  He does have an occluded right popliteal artery.  Unfortunately, over the last 3 months he has had very little improvement in his ischemic ulcer.  He may benefit from hyperbaric therapy.  If his wound does not improve he may ultimately require a right above-the-knee amputation.

## 2017-07-18 DIAGNOSIS — E11622 Type 2 diabetes mellitus with other skin ulcer: Secondary | ICD-10-CM | POA: Diagnosis not present

## 2017-07-25 DIAGNOSIS — E11622 Type 2 diabetes mellitus with other skin ulcer: Secondary | ICD-10-CM | POA: Diagnosis not present

## 2017-08-08 ENCOUNTER — Encounter (HOSPITAL_BASED_OUTPATIENT_CLINIC_OR_DEPARTMENT_OTHER): Payer: Medicare (Managed Care) | Attending: Internal Medicine

## 2017-08-08 ENCOUNTER — Other Ambulatory Visit: Payer: Self-pay | Admitting: *Deleted

## 2017-08-08 DIAGNOSIS — L97812 Non-pressure chronic ulcer of other part of right lower leg with fat layer exposed: Secondary | ICD-10-CM | POA: Insufficient documentation

## 2017-08-08 DIAGNOSIS — M069 Rheumatoid arthritis, unspecified: Secondary | ICD-10-CM | POA: Insufficient documentation

## 2017-08-08 DIAGNOSIS — I998 Other disorder of circulatory system: Secondary | ICD-10-CM

## 2017-08-08 DIAGNOSIS — E1140 Type 2 diabetes mellitus with diabetic neuropathy, unspecified: Secondary | ICD-10-CM | POA: Insufficient documentation

## 2017-08-08 DIAGNOSIS — E11622 Type 2 diabetes mellitus with other skin ulcer: Secondary | ICD-10-CM | POA: Diagnosis present

## 2017-08-08 DIAGNOSIS — E1151 Type 2 diabetes mellitus with diabetic peripheral angiopathy without gangrene: Secondary | ICD-10-CM | POA: Insufficient documentation

## 2017-08-08 DIAGNOSIS — I739 Peripheral vascular disease, unspecified: Secondary | ICD-10-CM

## 2017-08-08 DIAGNOSIS — I10 Essential (primary) hypertension: Secondary | ICD-10-CM | POA: Insufficient documentation

## 2017-08-08 DIAGNOSIS — I70229 Atherosclerosis of native arteries of extremities with rest pain, unspecified extremity: Secondary | ICD-10-CM

## 2017-08-08 DIAGNOSIS — J449 Chronic obstructive pulmonary disease, unspecified: Secondary | ICD-10-CM | POA: Insufficient documentation

## 2017-08-22 ENCOUNTER — Ambulatory Visit: Payer: Medicare (Managed Care) | Admitting: Cardiovascular Disease

## 2017-08-22 DIAGNOSIS — E11622 Type 2 diabetes mellitus with other skin ulcer: Secondary | ICD-10-CM | POA: Diagnosis not present

## 2017-09-05 ENCOUNTER — Encounter (HOSPITAL_BASED_OUTPATIENT_CLINIC_OR_DEPARTMENT_OTHER): Payer: Medicare (Managed Care) | Attending: Internal Medicine

## 2017-09-05 DIAGNOSIS — E11622 Type 2 diabetes mellitus with other skin ulcer: Secondary | ICD-10-CM | POA: Insufficient documentation

## 2017-09-05 DIAGNOSIS — M069 Rheumatoid arthritis, unspecified: Secondary | ICD-10-CM | POA: Insufficient documentation

## 2017-09-05 DIAGNOSIS — I10 Essential (primary) hypertension: Secondary | ICD-10-CM | POA: Diagnosis not present

## 2017-09-05 DIAGNOSIS — F17210 Nicotine dependence, cigarettes, uncomplicated: Secondary | ICD-10-CM | POA: Insufficient documentation

## 2017-09-05 DIAGNOSIS — E1140 Type 2 diabetes mellitus with diabetic neuropathy, unspecified: Secondary | ICD-10-CM | POA: Diagnosis not present

## 2017-09-05 DIAGNOSIS — E1151 Type 2 diabetes mellitus with diabetic peripheral angiopathy without gangrene: Secondary | ICD-10-CM | POA: Insufficient documentation

## 2017-09-05 DIAGNOSIS — Z89511 Acquired absence of right leg below knee: Secondary | ICD-10-CM | POA: Diagnosis not present

## 2017-09-05 DIAGNOSIS — L97813 Non-pressure chronic ulcer of other part of right lower leg with necrosis of muscle: Secondary | ICD-10-CM | POA: Insufficient documentation

## 2017-09-05 DIAGNOSIS — J449 Chronic obstructive pulmonary disease, unspecified: Secondary | ICD-10-CM | POA: Insufficient documentation

## 2017-09-05 DIAGNOSIS — E1169 Type 2 diabetes mellitus with other specified complication: Secondary | ICD-10-CM | POA: Insufficient documentation

## 2017-09-05 DIAGNOSIS — M86661 Other chronic osteomyelitis, right tibia and fibula: Secondary | ICD-10-CM | POA: Diagnosis not present

## 2017-09-10 DIAGNOSIS — E1169 Type 2 diabetes mellitus with other specified complication: Secondary | ICD-10-CM | POA: Diagnosis not present

## 2017-09-10 LAB — GLUCOSE, CAPILLARY: Glucose-Capillary: 134 mg/dL — ABNORMAL HIGH (ref 70–99)

## 2017-09-11 DIAGNOSIS — E1169 Type 2 diabetes mellitus with other specified complication: Secondary | ICD-10-CM | POA: Diagnosis not present

## 2017-09-11 LAB — GLUCOSE, CAPILLARY: Glucose-Capillary: 229 mg/dL — ABNORMAL HIGH (ref 70–99)

## 2017-09-12 DIAGNOSIS — E1169 Type 2 diabetes mellitus with other specified complication: Secondary | ICD-10-CM | POA: Diagnosis not present

## 2017-09-12 LAB — GLUCOSE, CAPILLARY
Glucose-Capillary: 260 mg/dL — ABNORMAL HIGH (ref 70–99)
Glucose-Capillary: 266 mg/dL — ABNORMAL HIGH (ref 70–99)

## 2017-09-15 DIAGNOSIS — E1169 Type 2 diabetes mellitus with other specified complication: Secondary | ICD-10-CM | POA: Diagnosis not present

## 2017-09-15 LAB — GLUCOSE, CAPILLARY
Glucose-Capillary: 327 mg/dL — ABNORMAL HIGH (ref 70–99)
Glucose-Capillary: 370 mg/dL — ABNORMAL HIGH (ref 70–99)

## 2017-09-16 DIAGNOSIS — E1169 Type 2 diabetes mellitus with other specified complication: Secondary | ICD-10-CM | POA: Diagnosis not present

## 2017-09-16 LAB — GLUCOSE, CAPILLARY
Glucose-Capillary: 326 mg/dL — ABNORMAL HIGH (ref 70–99)
Glucose-Capillary: 270 mg/dL — ABNORMAL HIGH (ref 70–99)

## 2017-09-17 DIAGNOSIS — E1169 Type 2 diabetes mellitus with other specified complication: Secondary | ICD-10-CM | POA: Diagnosis not present

## 2017-09-17 LAB — GLUCOSE, CAPILLARY
Glucose-Capillary: 378 mg/dL — ABNORMAL HIGH (ref 70–99)
Glucose-Capillary: 323 mg/dL — ABNORMAL HIGH (ref 70–99)

## 2017-09-18 DIAGNOSIS — E1169 Type 2 diabetes mellitus with other specified complication: Secondary | ICD-10-CM | POA: Diagnosis not present

## 2017-09-18 LAB — GLUCOSE, CAPILLARY
Glucose-Capillary: 371 mg/dL — ABNORMAL HIGH (ref 70–99)
Glucose-Capillary: 323 mg/dL — ABNORMAL HIGH (ref 70–99)

## 2017-09-19 DIAGNOSIS — E1169 Type 2 diabetes mellitus with other specified complication: Secondary | ICD-10-CM | POA: Diagnosis not present

## 2017-09-19 LAB — GLUCOSE, CAPILLARY
Glucose-Capillary: 293 mg/dL — ABNORMAL HIGH (ref 70–99)
Glucose-Capillary: 259 mg/dL — ABNORMAL HIGH (ref 70–99)

## 2017-09-22 DIAGNOSIS — E1169 Type 2 diabetes mellitus with other specified complication: Secondary | ICD-10-CM | POA: Diagnosis not present

## 2017-09-22 LAB — GLUCOSE, CAPILLARY
Glucose-Capillary: 246 mg/dL — ABNORMAL HIGH (ref 70–99)
Glucose-Capillary: 224 mg/dL — ABNORMAL HIGH (ref 70–99)

## 2017-09-23 DIAGNOSIS — E1169 Type 2 diabetes mellitus with other specified complication: Secondary | ICD-10-CM | POA: Diagnosis not present

## 2017-09-23 LAB — GLUCOSE, CAPILLARY
Glucose-Capillary: 316 mg/dL — ABNORMAL HIGH (ref 70–99)
Glucose-Capillary: 284 mg/dL — ABNORMAL HIGH (ref 70–99)

## 2017-09-25 DIAGNOSIS — E1169 Type 2 diabetes mellitus with other specified complication: Secondary | ICD-10-CM | POA: Diagnosis not present

## 2017-09-25 LAB — GLUCOSE, CAPILLARY
Glucose-Capillary: 336 mg/dL — ABNORMAL HIGH (ref 70–99)
Glucose-Capillary: 333 mg/dL — ABNORMAL HIGH (ref 70–99)

## 2017-09-26 DIAGNOSIS — E1169 Type 2 diabetes mellitus with other specified complication: Secondary | ICD-10-CM | POA: Diagnosis not present

## 2017-09-26 LAB — GLUCOSE, CAPILLARY
Glucose-Capillary: 299 mg/dL — ABNORMAL HIGH (ref 70–99)
Glucose-Capillary: 272 mg/dL — ABNORMAL HIGH (ref 70–99)

## 2017-09-30 DIAGNOSIS — E1169 Type 2 diabetes mellitus with other specified complication: Secondary | ICD-10-CM | POA: Diagnosis not present

## 2017-09-30 LAB — GLUCOSE, CAPILLARY
Glucose-Capillary: 274 mg/dL — ABNORMAL HIGH (ref 70–99)
Glucose-Capillary: 278 mg/dL — ABNORMAL HIGH (ref 70–99)

## 2017-10-01 DIAGNOSIS — E1169 Type 2 diabetes mellitus with other specified complication: Secondary | ICD-10-CM | POA: Diagnosis not present

## 2017-10-01 LAB — GLUCOSE, CAPILLARY
Glucose-Capillary: 314 mg/dL — ABNORMAL HIGH (ref 70–99)
Glucose-Capillary: 272 mg/dL — ABNORMAL HIGH (ref 70–99)

## 2017-10-02 ENCOUNTER — Encounter (HOSPITAL_BASED_OUTPATIENT_CLINIC_OR_DEPARTMENT_OTHER): Payer: Medicare (Managed Care) | Attending: Internal Medicine

## 2017-10-02 DIAGNOSIS — E11622 Type 2 diabetes mellitus with other skin ulcer: Secondary | ICD-10-CM | POA: Insufficient documentation

## 2017-10-02 DIAGNOSIS — F17210 Nicotine dependence, cigarettes, uncomplicated: Secondary | ICD-10-CM | POA: Diagnosis not present

## 2017-10-02 DIAGNOSIS — M86661 Other chronic osteomyelitis, right tibia and fibula: Secondary | ICD-10-CM | POA: Insufficient documentation

## 2017-10-02 DIAGNOSIS — E1151 Type 2 diabetes mellitus with diabetic peripheral angiopathy without gangrene: Secondary | ICD-10-CM | POA: Diagnosis not present

## 2017-10-02 DIAGNOSIS — Z89511 Acquired absence of right leg below knee: Secondary | ICD-10-CM | POA: Insufficient documentation

## 2017-10-02 DIAGNOSIS — E1140 Type 2 diabetes mellitus with diabetic neuropathy, unspecified: Secondary | ICD-10-CM | POA: Diagnosis not present

## 2017-10-02 DIAGNOSIS — L97812 Non-pressure chronic ulcer of other part of right lower leg with fat layer exposed: Secondary | ICD-10-CM | POA: Diagnosis not present

## 2017-10-02 DIAGNOSIS — E1169 Type 2 diabetes mellitus with other specified complication: Secondary | ICD-10-CM | POA: Insufficient documentation

## 2017-10-02 LAB — GLUCOSE, CAPILLARY
Glucose-Capillary: 347 mg/dL — ABNORMAL HIGH (ref 70–99)
Glucose-Capillary: 317 mg/dL — ABNORMAL HIGH (ref 70–99)

## 2017-10-07 DIAGNOSIS — E1169 Type 2 diabetes mellitus with other specified complication: Secondary | ICD-10-CM | POA: Diagnosis not present

## 2017-10-07 LAB — GLUCOSE, CAPILLARY
Glucose-Capillary: 331 mg/dL — ABNORMAL HIGH (ref 70–99)
Glucose-Capillary: 313 mg/dL — ABNORMAL HIGH (ref 70–99)

## 2017-10-08 DIAGNOSIS — E1169 Type 2 diabetes mellitus with other specified complication: Secondary | ICD-10-CM | POA: Diagnosis not present

## 2017-10-08 LAB — GLUCOSE, CAPILLARY
Glucose-Capillary: 314 mg/dL — ABNORMAL HIGH (ref 70–99)
Glucose-Capillary: 326 mg/dL — ABNORMAL HIGH (ref 70–99)

## 2017-10-09 DIAGNOSIS — E1169 Type 2 diabetes mellitus with other specified complication: Secondary | ICD-10-CM | POA: Diagnosis not present

## 2017-10-09 LAB — GLUCOSE, CAPILLARY
Glucose-Capillary: 284 mg/dL — ABNORMAL HIGH (ref 70–99)
Glucose-Capillary: 264 mg/dL — ABNORMAL HIGH (ref 70–99)

## 2017-10-10 DIAGNOSIS — E1169 Type 2 diabetes mellitus with other specified complication: Secondary | ICD-10-CM | POA: Diagnosis not present

## 2017-10-10 LAB — GLUCOSE, CAPILLARY
Glucose-Capillary: 290 mg/dL — ABNORMAL HIGH (ref 70–99)
Glucose-Capillary: 299 mg/dL — ABNORMAL HIGH (ref 70–99)

## 2017-10-13 DIAGNOSIS — E1169 Type 2 diabetes mellitus with other specified complication: Secondary | ICD-10-CM | POA: Diagnosis not present

## 2017-10-13 LAB — GLUCOSE, CAPILLARY
Glucose-Capillary: 320 mg/dL — ABNORMAL HIGH (ref 70–99)
Glucose-Capillary: 279 mg/dL — ABNORMAL HIGH (ref 70–99)

## 2017-10-14 DIAGNOSIS — E1169 Type 2 diabetes mellitus with other specified complication: Secondary | ICD-10-CM | POA: Diagnosis not present

## 2017-10-14 LAB — GLUCOSE, CAPILLARY
Glucose-Capillary: 304 mg/dL — ABNORMAL HIGH (ref 70–99)
Glucose-Capillary: 283 mg/dL — ABNORMAL HIGH (ref 70–99)

## 2017-10-15 DIAGNOSIS — E1169 Type 2 diabetes mellitus with other specified complication: Secondary | ICD-10-CM | POA: Diagnosis not present

## 2017-10-15 LAB — GLUCOSE, CAPILLARY
Glucose-Capillary: 268 mg/dL — ABNORMAL HIGH (ref 70–99)
Glucose-Capillary: 296 mg/dL — ABNORMAL HIGH (ref 70–99)

## 2017-10-16 DIAGNOSIS — E1169 Type 2 diabetes mellitus with other specified complication: Secondary | ICD-10-CM | POA: Diagnosis not present

## 2017-10-16 LAB — GLUCOSE, CAPILLARY
Glucose-Capillary: 310 mg/dL — ABNORMAL HIGH (ref 70–99)
Glucose-Capillary: 288 mg/dL — ABNORMAL HIGH (ref 70–99)

## 2017-10-20 DIAGNOSIS — E1169 Type 2 diabetes mellitus with other specified complication: Secondary | ICD-10-CM | POA: Diagnosis not present

## 2017-10-20 LAB — GLUCOSE, CAPILLARY
Glucose-Capillary: 324 mg/dL — ABNORMAL HIGH (ref 70–99)
Glucose-Capillary: 295 mg/dL — ABNORMAL HIGH (ref 70–99)

## 2017-10-23 DIAGNOSIS — E1169 Type 2 diabetes mellitus with other specified complication: Secondary | ICD-10-CM | POA: Diagnosis not present

## 2017-10-23 LAB — GLUCOSE, CAPILLARY
Glucose-Capillary: 335 mg/dL — ABNORMAL HIGH (ref 70–99)
Glucose-Capillary: 331 mg/dL — ABNORMAL HIGH (ref 70–99)

## 2017-10-24 DIAGNOSIS — E1169 Type 2 diabetes mellitus with other specified complication: Secondary | ICD-10-CM | POA: Diagnosis not present

## 2017-10-24 LAB — GLUCOSE, CAPILLARY
Glucose-Capillary: 396 mg/dL — ABNORMAL HIGH (ref 70–99)
Glucose-Capillary: 335 mg/dL — ABNORMAL HIGH (ref 70–99)

## 2017-10-27 DIAGNOSIS — E1169 Type 2 diabetes mellitus with other specified complication: Secondary | ICD-10-CM | POA: Diagnosis not present

## 2017-10-27 LAB — GLUCOSE, CAPILLARY
Glucose-Capillary: 364 mg/dL — ABNORMAL HIGH (ref 70–99)
Glucose-Capillary: 346 mg/dL — ABNORMAL HIGH (ref 70–99)

## 2017-10-28 DIAGNOSIS — E1169 Type 2 diabetes mellitus with other specified complication: Secondary | ICD-10-CM | POA: Diagnosis not present

## 2017-10-28 LAB — GLUCOSE, CAPILLARY
Glucose-Capillary: 324 mg/dL — ABNORMAL HIGH (ref 70–99)
Glucose-Capillary: 352 mg/dL — ABNORMAL HIGH (ref 70–99)

## 2017-10-29 DIAGNOSIS — E1169 Type 2 diabetes mellitus with other specified complication: Secondary | ICD-10-CM | POA: Diagnosis not present

## 2017-10-29 LAB — GLUCOSE, CAPILLARY
Glucose-Capillary: 302 mg/dL — ABNORMAL HIGH (ref 70–99)
Glucose-Capillary: 328 mg/dL — ABNORMAL HIGH (ref 70–99)

## 2017-10-30 DIAGNOSIS — E1169 Type 2 diabetes mellitus with other specified complication: Secondary | ICD-10-CM | POA: Diagnosis not present

## 2017-10-30 LAB — GLUCOSE, CAPILLARY
Glucose-Capillary: 310 mg/dL — ABNORMAL HIGH (ref 70–99)
Glucose-Capillary: 297 mg/dL — ABNORMAL HIGH (ref 70–99)

## 2017-10-31 DIAGNOSIS — E1169 Type 2 diabetes mellitus with other specified complication: Secondary | ICD-10-CM | POA: Diagnosis not present

## 2017-10-31 LAB — GLUCOSE, CAPILLARY
Glucose-Capillary: 259 mg/dL — ABNORMAL HIGH (ref 70–99)
Glucose-Capillary: 330 mg/dL — ABNORMAL HIGH (ref 70–99)

## 2017-11-04 ENCOUNTER — Encounter (HOSPITAL_BASED_OUTPATIENT_CLINIC_OR_DEPARTMENT_OTHER): Payer: Medicare (Managed Care) | Attending: Internal Medicine

## 2017-11-04 DIAGNOSIS — M86161 Other acute osteomyelitis, right tibia and fibula: Secondary | ICD-10-CM | POA: Insufficient documentation

## 2017-11-04 DIAGNOSIS — E1169 Type 2 diabetes mellitus with other specified complication: Secondary | ICD-10-CM | POA: Insufficient documentation

## 2017-11-04 DIAGNOSIS — J449 Chronic obstructive pulmonary disease, unspecified: Secondary | ICD-10-CM | POA: Insufficient documentation

## 2017-11-04 DIAGNOSIS — E1140 Type 2 diabetes mellitus with diabetic neuropathy, unspecified: Secondary | ICD-10-CM | POA: Diagnosis not present

## 2017-11-04 DIAGNOSIS — B9561 Methicillin susceptible Staphylococcus aureus infection as the cause of diseases classified elsewhere: Secondary | ICD-10-CM | POA: Diagnosis not present

## 2017-11-04 DIAGNOSIS — E1151 Type 2 diabetes mellitus with diabetic peripheral angiopathy without gangrene: Secondary | ICD-10-CM | POA: Diagnosis not present

## 2017-11-04 DIAGNOSIS — E11622 Type 2 diabetes mellitus with other skin ulcer: Secondary | ICD-10-CM | POA: Diagnosis not present

## 2017-11-04 DIAGNOSIS — M86461 Chronic osteomyelitis with draining sinus, right tibia and fibula: Secondary | ICD-10-CM | POA: Diagnosis not present

## 2017-11-04 DIAGNOSIS — Z87891 Personal history of nicotine dependence: Secondary | ICD-10-CM | POA: Insufficient documentation

## 2017-11-04 DIAGNOSIS — M069 Rheumatoid arthritis, unspecified: Secondary | ICD-10-CM | POA: Insufficient documentation

## 2017-11-04 DIAGNOSIS — L97213 Non-pressure chronic ulcer of right calf with necrosis of muscle: Secondary | ICD-10-CM | POA: Diagnosis not present

## 2017-11-04 LAB — GLUCOSE, CAPILLARY
Glucose-Capillary: 249 mg/dL — ABNORMAL HIGH (ref 70–99)
Glucose-Capillary: 307 mg/dL — ABNORMAL HIGH (ref 70–99)

## 2017-11-05 DIAGNOSIS — E11622 Type 2 diabetes mellitus with other skin ulcer: Secondary | ICD-10-CM | POA: Diagnosis not present

## 2017-11-05 LAB — GLUCOSE, CAPILLARY
Glucose-Capillary: 326 mg/dL — ABNORMAL HIGH (ref 70–99)
Glucose-Capillary: 376 mg/dL — ABNORMAL HIGH (ref 70–99)

## 2017-11-06 DIAGNOSIS — E11622 Type 2 diabetes mellitus with other skin ulcer: Secondary | ICD-10-CM | POA: Diagnosis not present

## 2017-11-06 LAB — GLUCOSE, CAPILLARY
Glucose-Capillary: 291 mg/dL — ABNORMAL HIGH (ref 70–99)
Glucose-Capillary: 344 mg/dL — ABNORMAL HIGH (ref 70–99)

## 2017-11-07 DIAGNOSIS — E11622 Type 2 diabetes mellitus with other skin ulcer: Secondary | ICD-10-CM | POA: Diagnosis not present

## 2017-11-07 LAB — GLUCOSE, CAPILLARY
Glucose-Capillary: 408 mg/dL — ABNORMAL HIGH (ref 70–99)
Glucose-Capillary: 395 mg/dL — ABNORMAL HIGH (ref 70–99)

## 2017-11-10 DIAGNOSIS — E11622 Type 2 diabetes mellitus with other skin ulcer: Secondary | ICD-10-CM | POA: Diagnosis not present

## 2017-11-10 LAB — GLUCOSE, CAPILLARY
Glucose-Capillary: 333 mg/dL — ABNORMAL HIGH (ref 70–99)
Glucose-Capillary: 367 mg/dL — ABNORMAL HIGH (ref 70–99)

## 2017-11-11 DIAGNOSIS — E11622 Type 2 diabetes mellitus with other skin ulcer: Secondary | ICD-10-CM | POA: Diagnosis not present

## 2017-11-11 LAB — GLUCOSE, CAPILLARY
Glucose-Capillary: 336 mg/dL — ABNORMAL HIGH (ref 70–99)
Glucose-Capillary: 356 mg/dL — ABNORMAL HIGH (ref 70–99)

## 2017-11-12 DIAGNOSIS — E11622 Type 2 diabetes mellitus with other skin ulcer: Secondary | ICD-10-CM | POA: Diagnosis not present

## 2017-11-12 LAB — GLUCOSE, CAPILLARY
Glucose-Capillary: 398 mg/dL — ABNORMAL HIGH (ref 70–99)
Glucose-Capillary: 335 mg/dL — ABNORMAL HIGH (ref 70–99)

## 2017-11-13 DIAGNOSIS — E11622 Type 2 diabetes mellitus with other skin ulcer: Secondary | ICD-10-CM | POA: Diagnosis not present

## 2017-11-13 LAB — GLUCOSE, CAPILLARY
Glucose-Capillary: 399 mg/dL — ABNORMAL HIGH (ref 70–99)
Glucose-Capillary: 316 mg/dL — ABNORMAL HIGH (ref 70–99)

## 2017-11-14 ENCOUNTER — Other Ambulatory Visit (HOSPITAL_BASED_OUTPATIENT_CLINIC_OR_DEPARTMENT_OTHER): Payer: Self-pay | Admitting: Internal Medicine

## 2017-11-14 ENCOUNTER — Other Ambulatory Visit (HOSPITAL_COMMUNITY)
Admission: RE | Admit: 2017-11-14 | Discharge: 2017-11-14 | Disposition: A | Discharge status: Home / Self Care | Payer: Medicare (Managed Care) | Source: Other Acute Inpatient Hospital | Attending: Internal Medicine | Admitting: Internal Medicine

## 2017-11-14 DIAGNOSIS — L97213 Non-pressure chronic ulcer of right calf with necrosis of muscle: Secondary | ICD-10-CM | POA: Insufficient documentation

## 2017-11-14 DIAGNOSIS — E11622 Type 2 diabetes mellitus with other skin ulcer: Secondary | ICD-10-CM | POA: Diagnosis not present

## 2017-11-14 LAB — GLUCOSE, CAPILLARY
Glucose-Capillary: 381 mg/dL — ABNORMAL HIGH (ref 70–99)
Glucose-Capillary: 318 mg/dL — ABNORMAL HIGH (ref 70–99)

## 2017-11-17 DIAGNOSIS — E11622 Type 2 diabetes mellitus with other skin ulcer: Secondary | ICD-10-CM | POA: Diagnosis not present

## 2017-11-17 LAB — GLUCOSE, CAPILLARY
Glucose-Capillary: 292 mg/dL — ABNORMAL HIGH (ref 70–99)
Glucose-Capillary: 293 mg/dL — ABNORMAL HIGH (ref 70–99)

## 2017-11-18 DIAGNOSIS — E11622 Type 2 diabetes mellitus with other skin ulcer: Secondary | ICD-10-CM | POA: Diagnosis not present

## 2017-11-18 LAB — GLUCOSE, CAPILLARY
Glucose-Capillary: 287 mg/dL — ABNORMAL HIGH (ref 70–99)
Glucose-Capillary: 296 mg/dL — ABNORMAL HIGH (ref 70–99)

## 2017-11-19 DIAGNOSIS — E11622 Type 2 diabetes mellitus with other skin ulcer: Secondary | ICD-10-CM | POA: Diagnosis not present

## 2017-11-19 LAB — GLUCOSE, CAPILLARY
Glucose-Capillary: 308 mg/dL — ABNORMAL HIGH (ref 70–99)
Glucose-Capillary: 294 mg/dL — ABNORMAL HIGH (ref 70–99)

## 2017-11-20 DIAGNOSIS — E11622 Type 2 diabetes mellitus with other skin ulcer: Secondary | ICD-10-CM | POA: Diagnosis not present

## 2017-11-20 LAB — AEROBIC CULTURE W GRAM STAIN (SUPERFICIAL SPECIMEN)

## 2017-11-20 LAB — GLUCOSE, CAPILLARY
Glucose-Capillary: 329 mg/dL — ABNORMAL HIGH (ref 70–99)
Glucose-Capillary: 245 mg/dL — ABNORMAL HIGH (ref 70–99)

## 2017-11-21 DIAGNOSIS — E11622 Type 2 diabetes mellitus with other skin ulcer: Secondary | ICD-10-CM | POA: Diagnosis not present

## 2017-11-21 LAB — GLUCOSE, CAPILLARY
Glucose-Capillary: 190 mg/dL — ABNORMAL HIGH (ref 70–99)
Glucose-Capillary: 213 mg/dL — ABNORMAL HIGH (ref 70–99)

## 2017-11-25 DIAGNOSIS — E11622 Type 2 diabetes mellitus with other skin ulcer: Secondary | ICD-10-CM | POA: Diagnosis not present

## 2017-11-25 LAB — GLUCOSE, CAPILLARY
Glucose-Capillary: 181 mg/dL — ABNORMAL HIGH (ref 70–99)
Glucose-Capillary: 280 mg/dL — ABNORMAL HIGH (ref 70–99)

## 2017-11-26 DIAGNOSIS — E11622 Type 2 diabetes mellitus with other skin ulcer: Secondary | ICD-10-CM | POA: Diagnosis not present

## 2017-11-26 LAB — GLUCOSE, CAPILLARY
Glucose-Capillary: 200 mg/dL — ABNORMAL HIGH (ref 70–99)
Glucose-Capillary: 241 mg/dL — ABNORMAL HIGH (ref 70–99)

## 2017-11-27 DIAGNOSIS — E11622 Type 2 diabetes mellitus with other skin ulcer: Secondary | ICD-10-CM | POA: Diagnosis not present

## 2017-11-27 LAB — GLUCOSE, CAPILLARY
Glucose-Capillary: 198 mg/dL — ABNORMAL HIGH (ref 70–99)
Glucose-Capillary: 272 mg/dL — ABNORMAL HIGH (ref 70–99)

## 2017-11-28 DIAGNOSIS — E11622 Type 2 diabetes mellitus with other skin ulcer: Secondary | ICD-10-CM | POA: Diagnosis not present

## 2017-11-28 LAB — GLUCOSE, CAPILLARY
Glucose-Capillary: 228 mg/dL — ABNORMAL HIGH (ref 70–99)
Glucose-Capillary: 262 mg/dL — ABNORMAL HIGH (ref 70–99)

## 2017-12-01 DIAGNOSIS — E11622 Type 2 diabetes mellitus with other skin ulcer: Secondary | ICD-10-CM | POA: Diagnosis not present

## 2017-12-01 LAB — GLUCOSE, CAPILLARY
Glucose-Capillary: 222 mg/dL — ABNORMAL HIGH (ref 70–99)
Glucose-Capillary: 283 mg/dL — ABNORMAL HIGH (ref 70–99)

## 2017-12-02 ENCOUNTER — Encounter (HOSPITAL_BASED_OUTPATIENT_CLINIC_OR_DEPARTMENT_OTHER): Payer: Medicare (Managed Care) | Attending: Internal Medicine

## 2017-12-02 DIAGNOSIS — E1151 Type 2 diabetes mellitus with diabetic peripheral angiopathy without gangrene: Secondary | ICD-10-CM | POA: Insufficient documentation

## 2017-12-02 DIAGNOSIS — B9561 Methicillin susceptible Staphylococcus aureus infection as the cause of diseases classified elsewhere: Secondary | ICD-10-CM | POA: Diagnosis not present

## 2017-12-02 DIAGNOSIS — E11622 Type 2 diabetes mellitus with other skin ulcer: Secondary | ICD-10-CM | POA: Insufficient documentation

## 2017-12-02 DIAGNOSIS — M8648 Chronic osteomyelitis with draining sinus, other site: Secondary | ICD-10-CM | POA: Diagnosis not present

## 2017-12-02 DIAGNOSIS — J449 Chronic obstructive pulmonary disease, unspecified: Secondary | ICD-10-CM | POA: Insufficient documentation

## 2017-12-02 DIAGNOSIS — I10 Essential (primary) hypertension: Secondary | ICD-10-CM | POA: Insufficient documentation

## 2017-12-02 DIAGNOSIS — L97812 Non-pressure chronic ulcer of other part of right lower leg with fat layer exposed: Secondary | ICD-10-CM | POA: Insufficient documentation

## 2017-12-02 DIAGNOSIS — M069 Rheumatoid arthritis, unspecified: Secondary | ICD-10-CM | POA: Diagnosis not present

## 2017-12-02 DIAGNOSIS — E1140 Type 2 diabetes mellitus with diabetic neuropathy, unspecified: Secondary | ICD-10-CM | POA: Diagnosis not present

## 2017-12-02 LAB — GLUCOSE, CAPILLARY
Glucose-Capillary: 266 mg/dL — ABNORMAL HIGH (ref 70–99)
Glucose-Capillary: 334 mg/dL — ABNORMAL HIGH (ref 70–99)

## 2017-12-03 DIAGNOSIS — M8648 Chronic osteomyelitis with draining sinus, other site: Secondary | ICD-10-CM | POA: Diagnosis not present

## 2017-12-03 LAB — GLUCOSE, CAPILLARY
Glucose-Capillary: 201 mg/dL — ABNORMAL HIGH (ref 70–99)
Glucose-Capillary: 200 mg/dL — ABNORMAL HIGH (ref 70–99)

## 2017-12-04 ENCOUNTER — Encounter: Payer: Self-pay | Admitting: Internal Medicine

## 2017-12-04 ENCOUNTER — Ambulatory Visit (INDEPENDENT_AMBULATORY_CARE_PROVIDER_SITE_OTHER): Payer: Medicare (Managed Care) | Admitting: Internal Medicine

## 2017-12-04 DIAGNOSIS — M8648 Chronic osteomyelitis with draining sinus, other site: Secondary | ICD-10-CM | POA: Diagnosis not present

## 2017-12-04 DIAGNOSIS — M86261 Subacute osteomyelitis, right tibia and fibula: Secondary | ICD-10-CM

## 2017-12-04 LAB — GLUCOSE, CAPILLARY
Glucose-Capillary: 262 mg/dL — ABNORMAL HIGH (ref 70–99)
Glucose-Capillary: 180 mg/dL — ABNORMAL HIGH (ref 70–99)

## 2017-12-04 NOTE — Progress Notes (Signed)
[facility]  Reason for Consult: Possible osteomyelitis of right BKA stump Referring Provider: Dr. Kirtland Bouchard  Assessment: His chronic wound has been very slow to heal.  A recent sliver of bone worked its way out of the wound and was interpreted as acute osteomyelitis.  This does not necessarily mean that he has deeper osteomyelitis of his tibia or fibula.  I will check lab work today including inflammatory markers and obtain a CT of his stump.  If he does have residual osteomyelitis will treat him with IV cefazolin.  Plan: 1. Observe off of antibiotics for now 2. CBC, BMP, sed rate and C-reactive protein 3. CT scan with contrast 4. Follow-up in 2 weeks  Patient Active Problem List   Diagnosis Date Noted  . Tobacco abuse counseling   . Critical lower limb ischemia 05/02/2017  . Subacute osteomyelitis of right fibula (HCC)   . 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 Diabetes mellitus, insulin dependent (IDDM), controlled (HCC)   . Type 2 diabetes mellitus with diabetic neuropathy, with long-term current use of insulin (HCC)   . S/P unilateral BKA (below knee amputation) (HCC)   . Rheumatoid arthritis (HCC)   . Perineal abscess 12/02/2014  . Abscess 12/02/2014  . Gallstones 09/23/2013  . Non-intractable cyclical vomiting with nausea 09/23/2013  . Post-operative state 12/21/2012  . Cellulitis of perineum 11/30/2012  . Epidermal inclusion cyst 11/30/2012    Patient's Medications  New Prescriptions   No medications on file  Previous Medications   ASPIRIN 81 MG CHEWABLE TABLET    Chew 81 mg by mouth daily.   ATORVASTATIN (LIPITOR) 80 MG TABLET    Take 1 tablet (80 mg total) by mouth daily at 6 PM.   CALCIUM CARBONATE-VITAMIN D (CALCIUM + D PO)    Take 1 tablet by mouth daily.    CHOLECALCIFEROL (VITAMIN D) 1000  UNITS TABLET    Take 1,000 Units by mouth daily.   CLOPIDOGREL (PLAVIX) 75 MG TABLET    Take 1 tablet (75 mg total) by mouth daily with breakfast.   DOCUSATE SODIUM (COLACE) 100 MG CAPSULE    Take 100 mg by mouth 2 (two) times daily.   ETANERCEPT (ENBREL) 50 MG/ML INJECTION    Inject 50 mg into the skin every 7 (seven) days. Wednesdays   FUROSEMIDE (LASIX) 20 MG TABLET    Take 20 mg by mouth daily.   GLIMEPIRIDE (AMARYL) 4 MG TABLET    Take 4 mg by mouth daily.   HYDROCHLOROTHIAZIDE (MICROZIDE) 12.5 MG CAPSULE    Take 12.5 mg by mouth daily.   INSULIN GLARGINE (LANTUS) 100 UNIT/ML INJECTION    Inject 60 Units into the skin every evening.    LEFLUNOMIDE (ARAVA) 20 MG TABLET    Take 20 mg by mouth daily.   LIRAGLUTIDE (VICTOZA) 18 MG/3ML SOLN INJECTION    Inject 1.8 mg into the skin every evening.    METFORMIN (GLUCOPHAGE) 1000 MG TABLET    Take 1,000 mg by mouth 2 (two) times daily.   MORPHINE (MS CONTIN) 100 MG 12 HR TABLET    Take 100 mg by mouth every 12 (twelve) hours.   PREDNISONE (DELTASONE) 1 MG TABLET    Take 2 mg by mouth daily with breakfast.   PREGABALIN (LYRICA) 75 MG CAPSULE    Take 75-150 mg  by mouth 2 (two) times daily. Take 75 mg in the morning and 150 mg in the evening   SKIN PROTECTANTS, MISC. (EUCERIN) CREAM    Apply 1 application topically 2 (two) times daily.   VITAMIN B-12 (CYANOCOBALAMIN) 100 MCG TABLET    Take 100 mcg by mouth daily.  Modified Medications   No medications on file  Discontinued Medications   No medications on file    HPI: Brian Zavala is a 67 y.o. male with multiple medical problems who underwent right BKA many years ago.  About 1 year ago he developed a sore on the lateral side of his stump.  Dr. Aldean Zavala excised and debrided the area in January.  His operative note indicated that there was probable fibular osteomyelitis.  I do not see that any cultures or pathology specimens were sent.  The distal fibular stump was resected back to healthy  bleeding bone.  The surgical incision site was very slow to heal.  He has been followed by Dr. Leanord Zavala at the wound center recently and has been receiving hyperbaric oxygen therapy.  He has had multiple cultures of the wound since April, all of which have grown MSSA.  He tells me that recently a sliver of bone came out of the wound.  It was sent for culture and pathology.  MSSA was grown again and the path specimen revealed acute osteomyelitis.  He recalls being on several short courses of oral levofloxacin.  He is not on any antibiotic therapy currently.  He tells me that the wound is about half the size it was several months ago and it not as deep.  Review of Systems: Review of Systems  Constitutional: Negative for chills, diaphoresis and fever.  Musculoskeletal: Negative for joint pain.      Past Medical History:  Diagnosis Date  . Arthritis    "pretty much all over"  . COPD (chronic obstructive pulmonary disease) (HCC)   . Hyperlipidemia   . Hypertension   . Neuromuscular disorder (HCC)   . Neuropathy   . Type II diabetes mellitus (HCC)   . Wears glasses   . Wears partial dentures    top and bottom partials    Social History   Tobacco Use  . Smoking status: Current Every Day Smoker    Packs/day: 1.00    Years: 47.00    Pack years: 47.00    Types: Cigarettes  . Smokeless tobacco: Never Used  Substance Use Topics  . Alcohol use: No  . Drug use: No    Family History  Problem Relation Age of Onset  . Cirrhosis Father    Allergies  Allergen Reactions  . Varenicline Other (See Comments)    Upset stomach    OBJECTIVE: Vitals:   12/04/17 1401  BP: 114/72  Pulse: 94  Temp: 98.4 F (36.9 C)  Height: 6\' 1"  (1.854 m)   Body mass index is 24.41 kg/m.   Physical Exam  Constitutional: He is oriented to person, place, and time.  He is a very pleasant gentleman seated in his wheelchair.  Musculoskeletal:  He has a small open wound along the lateral side of his  right BKA stump.  The base is clean.  There is only a slight amount of clear drainage.  There is no surrounding erythema, warmth or swelling.  Neurological: He is alert and oriented to person, place, and time.  Skin: No rash noted.  Psychiatric: He has a normal mood and affect.  Microbiology: No results found for this or any previous visit (from the past 240 hour(s)).  Cliffton Asters, MD [facility] [phone] pager   [phone] cell 12/04/2017, 2:26 PM

## 2017-12-05 DIAGNOSIS — M8648 Chronic osteomyelitis with draining sinus, other site: Secondary | ICD-10-CM | POA: Diagnosis not present

## 2017-12-05 LAB — BASIC METABOLIC PANEL
BUN: 12 mg/dL (ref 7–25)
CO2: 29 mmol/L (ref 20–32)
Calcium: 8.9 mg/dL (ref 8.6–10.3)
Chloride: 96 mmol/L — ABNORMAL LOW (ref 98–110)
Creat: 0.84 mg/dL (ref 0.70–1.25)
Glucose, Bld: 260 mg/dL — ABNORMAL HIGH (ref 65–99)
Potassium: 4.2 mmol/L (ref 3.5–5.3)
Sodium: 136 mmol/L (ref 135–146)

## 2017-12-05 LAB — CBC
HCT: 36.5 % — ABNORMAL LOW (ref 38.5–50.0)
Hemoglobin: 12.1 g/dL — ABNORMAL LOW (ref 13.2–17.1)
MCH: 28.1 pg (ref 27.0–33.0)
MCHC: 33.2 g/dL (ref 32.0–36.0)
MCV: 84.7 fL (ref 80.0–100.0)
MPV: 11 fL (ref 7.5–12.5)
Platelets: 248 10*3/uL (ref 140–400)
RBC: 4.31 10*6/uL (ref 4.20–5.80)
RDW: 13.1 % (ref 11.0–15.0)
WBC: 7.5 10*3/uL (ref 3.8–10.8)

## 2017-12-05 LAB — GLUCOSE, CAPILLARY
Glucose-Capillary: 187 mg/dL — ABNORMAL HIGH (ref 70–99)
Glucose-Capillary: 247 mg/dL — ABNORMAL HIGH (ref 70–99)

## 2017-12-05 LAB — C-REACTIVE PROTEIN: CRP: 3.1 mg/L (ref <8.0)

## 2017-12-05 LAB — SEDIMENTATION RATE: Sed Rate: 2 mm/h (ref 0–20)

## 2017-12-08 DIAGNOSIS — M8648 Chronic osteomyelitis with draining sinus, other site: Secondary | ICD-10-CM | POA: Diagnosis not present

## 2017-12-08 LAB — GLUCOSE, CAPILLARY
Glucose-Capillary: 236 mg/dL — ABNORMAL HIGH (ref 70–99)
Glucose-Capillary: 292 mg/dL — ABNORMAL HIGH (ref 70–99)

## 2017-12-09 ENCOUNTER — Ambulatory Visit
Admission: RE | Admit: 2017-12-09 | Discharge: 2017-12-09 | Disposition: A | Discharge status: Home / Self Care | Payer: Medicare (Managed Care) | Source: Ambulatory Visit | Attending: Internal Medicine | Admitting: Internal Medicine

## 2017-12-09 DIAGNOSIS — M8648 Chronic osteomyelitis with draining sinus, other site: Secondary | ICD-10-CM | POA: Diagnosis not present

## 2017-12-09 DIAGNOSIS — M86261 Subacute osteomyelitis, right tibia and fibula: Secondary | ICD-10-CM

## 2017-12-09 LAB — GLUCOSE, CAPILLARY
Glucose-Capillary: 284 mg/dL — ABNORMAL HIGH (ref 70–99)
Glucose-Capillary: 340 mg/dL — ABNORMAL HIGH (ref 70–99)

## 2017-12-09 MED ORDER — IOPAMIDOL (ISOVUE-300) INJECTION 61%
100.0000 mL | Freq: Once | INTRAVENOUS | Status: AC | PRN
  Administered 2017-12-09: 100 mL via INTRAVENOUS

## 2017-12-11 DIAGNOSIS — M8648 Chronic osteomyelitis with draining sinus, other site: Secondary | ICD-10-CM | POA: Diagnosis not present

## 2017-12-11 LAB — GLUCOSE, CAPILLARY
Glucose-Capillary: 355 mg/dL — ABNORMAL HIGH (ref 70–99)
Glucose-Capillary: 314 mg/dL — ABNORMAL HIGH (ref 70–99)

## 2017-12-12 DIAGNOSIS — M8648 Chronic osteomyelitis with draining sinus, other site: Secondary | ICD-10-CM | POA: Diagnosis not present

## 2017-12-12 LAB — GLUCOSE, CAPILLARY
Glucose-Capillary: 252 mg/dL — ABNORMAL HIGH (ref 70–99)
Glucose-Capillary: 253 mg/dL — ABNORMAL HIGH (ref 70–99)

## 2017-12-15 DIAGNOSIS — M8648 Chronic osteomyelitis with draining sinus, other site: Secondary | ICD-10-CM | POA: Diagnosis not present

## 2017-12-15 LAB — GLUCOSE, CAPILLARY
Glucose-Capillary: 279 mg/dL — ABNORMAL HIGH (ref 70–99)
Glucose-Capillary: 235 mg/dL — ABNORMAL HIGH (ref 70–99)

## 2017-12-16 DIAGNOSIS — M8648 Chronic osteomyelitis with draining sinus, other site: Secondary | ICD-10-CM | POA: Diagnosis not present

## 2017-12-16 LAB — GLUCOSE, CAPILLARY
Glucose-Capillary: 328 mg/dL — ABNORMAL HIGH (ref 70–99)
Glucose-Capillary: 286 mg/dL — ABNORMAL HIGH (ref 70–99)

## 2017-12-17 ENCOUNTER — Ambulatory Visit (INDEPENDENT_AMBULATORY_CARE_PROVIDER_SITE_OTHER): Payer: Medicare (Managed Care) | Admitting: Internal Medicine

## 2017-12-17 ENCOUNTER — Encounter: Payer: Self-pay | Admitting: Internal Medicine

## 2017-12-17 DIAGNOSIS — M86261 Subacute osteomyelitis, right tibia and fibula: Secondary | ICD-10-CM | POA: Diagnosis not present

## 2017-12-17 NOTE — Assessment & Plan Note (Signed)
His inflammatory markers are normal and there is no radiographic evidence of osteomyelitis of his right BKA stump.  On CT there is a question of a very small fluid collection adjacent to the open wound but clinically this is not evident.  I am not sure if he had cellulitis of his left foot but I will have him complete his empiric course of levofloxacin.  He will follow-up here in 4 weeks.

## 2017-12-17 NOTE — Progress Notes (Signed)
[facility]  Patient Active Problem List   Diagnosis Date Noted  . Subacute osteomyelitis of right fibula (HCC)     Priority: High  . S/P unilateral BKA (below knee amputation) (HCC)     Priority: High  . Tobacco abuse counseling   . Critical lower limb ischemia 05/02/2017  . 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 Diabetes mellitus, insulin dependent (IDDM), controlled (HCC)   . Type 2 diabetes mellitus with diabetic neuropathy, with long-term current use of insulin (HCC)   . Rheumatoid arthritis (HCC)   . Gallstones 09/23/2013  . Non-intractable cyclical vomiting with nausea 09/23/2013  . Epidermal inclusion cyst 11/30/2012    Patient's Medications  New Prescriptions   No medications on file  Previous Medications   ASPIRIN 81 MG CHEWABLE TABLET    Chew 81 mg by mouth daily.   ATORVASTATIN (LIPITOR) 80 MG TABLET    Take 1 tablet (80 mg total) by mouth daily at 6 PM.   CALCIUM CARBONATE-VITAMIN D (CALCIUM + D PO)    Take 1 tablet by mouth daily.    CHOLECALCIFEROL (VITAMIN D) 1000 UNITS TABLET    Take 1,000 Units by mouth daily.   CLOPIDOGREL (PLAVIX) 75 MG TABLET    Take 1 tablet (75 mg total) by mouth daily with breakfast.   DOCUSATE SODIUM (COLACE) 100 MG CAPSULE    Take 100 mg by mouth 2 (two) times daily.   ETANERCEPT (ENBREL) 50 MG/ML INJECTION    Inject 50 mg into the skin every 7 (seven) days. Wednesdays   FUROSEMIDE (LASIX) 20 MG TABLET    Take 20 mg by mouth daily.   GLIMEPIRIDE (AMARYL) 4 MG TABLET    Take 4 mg by mouth daily.   HYDROCHLOROTHIAZIDE (MICROZIDE) 12.5 MG CAPSULE    Take 12.5 mg by mouth daily.   INSULIN GLARGINE (LANTUS) 100 UNIT/ML INJECTION    Inject 60 Units into the skin every evening.    LEFLUNOMIDE (ARAVA) 20 MG TABLET    Take 20 mg by mouth daily.   LIRAGLUTIDE (VICTOZA) 18 MG/3ML SOLN INJECTION    Inject 1.8 mg into the skin every evening.    METFORMIN  (GLUCOPHAGE) 1000 MG TABLET    Take 1,000 mg by mouth 2 (two) times daily.   MORPHINE (MS CONTIN) 100 MG 12 HR TABLET    Take 100 mg by mouth every 12 (twelve) hours.   PREDNISONE (DELTASONE) 1 MG TABLET    Take 2 mg by mouth daily with breakfast.   PREGABALIN (LYRICA) 75 MG CAPSULE    Take 75-150 mg by mouth 2 (two) times daily. Take 75 mg in the morning and 150 mg in the evening   SKIN PROTECTANTS, MISC. (EUCERIN) CREAM    Apply 1 application topically 2 (two) times daily.   VITAMIN B-12 (CYANOCOBALAMIN) 100 MCG TABLET    Take 100 mcg by mouth daily.  Modified Medications   No medications on file  Discontinued Medications   No medications on file    Subjective: Mr. Eberwein is here for his routine follow-up visit.  He completed 60 rounds of hyperbaric oxygen therapy recently.  He is small wound on his right BKA stump continues to heal slowly.  Nurse changes the dressing 3 times each week and he now follows up at the wound center weekly.  He is having very little drainage.  He has not noted any new redness  or swelling or pain.  He did notice a small blood blister over his left great toe and some mild swelling and erythema over the dorsum of his left foot recently.  He was seen by his nurse at [facility] and started on empiric levofloxacin 2 days ago for possible cellulitis.  He feels like his left foot is looking better.  Review of Systems: Review of Systems  Constitutional: Negative for chills, diaphoresis and fever.  Gastrointestinal: Negative for abdominal pain, diarrhea, nausea and vomiting.  Musculoskeletal: Negative for joint pain.    Past Medical History:  Diagnosis Date  . Arthritis    "pretty much all over"  . COPD (chronic obstructive pulmonary disease) (HCC)   . Hyperlipidemia   . Hypertension   . Neuromuscular disorder (HCC)   . Neuropathy   . Type II diabetes mellitus (HCC)   . Wears glasses   . Wears partial dentures    top and bottom partials    Social History    Tobacco Use  . Smoking status: Former Smoker    Packs/day: 1.00    Years: 47.00    Pack years: 47.00    Types: Cigarettes  . Smokeless tobacco: Never Used  Substance Use Topics  . Alcohol use: No  . Drug use: No    Family History  Problem Relation Age of Onset  . Cirrhosis Father     Allergies  Allergen Reactions  . Varenicline Other (See Comments)    Upset stomach    Objective: Vitals:   12/17/17 1447  BP: 125/77  Pulse: (!) 101  Temp: 98.7 F (37.1 C)  TempSrc: Oral  Weight: 175 lb 1.9 oz (79.4 kg)   Body mass index is 23.1 kg/m.  Physical Exam  Constitutional:  He is a good spirits.  He is seated in his wheelchair.  Musculoskeletal:  There is been little change in the small wound on the lateral aspect of his right BKA stump.  There is no drainage or odor.  There is a small central opening but no fluid is expressible.  There is no surrounding edema or fluctuance.  He has some mild swelling over the dorsum of his left foot but no active cellulitis.  He has a very small blood blister on his left great toe.  There are no open wounds.    Lab Results Sed Rate (mm/h)  Date Value  12/04/2017 2   CRP (mg/L)  Date Value  12/04/2017 3.1   CT scan of right knee with contrast 12/09/2017  IMPRESSION: Subcutaneous edema about the stump with a small focal fluid collection anterior to the scar compatible with cellulitis and abscess.  Negative for CT evidence osteomyelitis or septic joint.   Electronically Signed   By: Drusilla Kanner M.D.   On: 12/10/2017 08:57   Problem List Items Addressed This Visit      High   Subacute osteomyelitis of right fibula (HCC)    His inflammatory markers are normal and there is no radiographic evidence of osteomyelitis of his right BKA stump.  On CT there is a question of a very small fluid collection adjacent to the open wound but clinically this is not evident.  I am not sure if he had cellulitis of his left foot but I  will have him complete his empiric course of levofloxacin.  He will follow-up here in 4 weeks.          Cliffton Asters, MD [facility] [phone]  pager   [phone] cell 12/17/2017, 3:15 PM

## 2017-12-19 DIAGNOSIS — M8648 Chronic osteomyelitis with draining sinus, other site: Secondary | ICD-10-CM | POA: Diagnosis not present

## 2017-12-30 DIAGNOSIS — M8648 Chronic osteomyelitis with draining sinus, other site: Secondary | ICD-10-CM | POA: Diagnosis not present

## 2018-01-06 ENCOUNTER — Encounter (HOSPITAL_BASED_OUTPATIENT_CLINIC_OR_DEPARTMENT_OTHER): Payer: Medicare (Managed Care) | Attending: Internal Medicine

## 2018-01-06 DIAGNOSIS — L97812 Non-pressure chronic ulcer of other part of right lower leg with fat layer exposed: Secondary | ICD-10-CM | POA: Insufficient documentation

## 2018-01-06 DIAGNOSIS — E11622 Type 2 diabetes mellitus with other skin ulcer: Secondary | ICD-10-CM | POA: Diagnosis not present

## 2018-01-06 DIAGNOSIS — E1151 Type 2 diabetes mellitus with diabetic peripheral angiopathy without gangrene: Secondary | ICD-10-CM | POA: Diagnosis not present

## 2018-01-06 DIAGNOSIS — I10 Essential (primary) hypertension: Secondary | ICD-10-CM | POA: Diagnosis not present

## 2018-01-06 DIAGNOSIS — J449 Chronic obstructive pulmonary disease, unspecified: Secondary | ICD-10-CM | POA: Diagnosis not present

## 2018-01-06 DIAGNOSIS — Z89511 Acquired absence of right leg below knee: Secondary | ICD-10-CM | POA: Diagnosis not present

## 2018-01-06 DIAGNOSIS — E1140 Type 2 diabetes mellitus with diabetic neuropathy, unspecified: Secondary | ICD-10-CM | POA: Insufficient documentation

## 2018-01-20 ENCOUNTER — Ambulatory Visit: Payer: Medicare (Managed Care) | Admitting: Cardiovascular Disease

## 2018-01-20 DIAGNOSIS — E11622 Type 2 diabetes mellitus with other skin ulcer: Secondary | ICD-10-CM | POA: Diagnosis not present

## 2018-01-21 ENCOUNTER — Ambulatory Visit (INDEPENDENT_AMBULATORY_CARE_PROVIDER_SITE_OTHER): Payer: Medicare (Managed Care) | Admitting: Cardiovascular Disease

## 2018-01-21 ENCOUNTER — Encounter: Payer: Self-pay | Admitting: Cardiovascular Disease

## 2018-01-21 VITALS — BP 118/76 | HR 87 | Ht 73.0 in | Wt 179.8 lb

## 2018-01-21 DIAGNOSIS — I70229 Atherosclerosis of native arteries of extremities with rest pain, unspecified extremity: Secondary | ICD-10-CM

## 2018-01-21 DIAGNOSIS — Z79899 Other long term (current) drug therapy: Secondary | ICD-10-CM

## 2018-01-21 DIAGNOSIS — I998 Other disorder of circulatory system: Secondary | ICD-10-CM | POA: Diagnosis not present

## 2018-01-21 NOTE — Progress Notes (Signed)
01/21/2018 Brian Zavala   [birth year]  [id number]  [facility] Primary Cardiologist: [name] MD FACP, [facility], [place], [place]  HPI:  Brian Zavala is a 67 y.o.  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 He was referred by Dr. Lajoyce Corners for peripheral vascular evaluation because of critical limb ischemia.  I last saw him in the office 07/16/2017.  He has a history of 75-pack-years of tobacco abuse currently smoking 1-1/2 packs a day as well as 2 hypertension, diabetes and hyperlipidemia. He has never had a heart attack or stroke. 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 In June of last year he developed leg stump ulcer which has not healed. Dopplers performed in our office yesterday showed an occluded right SFA.. I performed angiography on him 05/05/17 revealing an occluded right SFA from the origin down the adductor canal which I was able to revascularize using directional atherectomy with drug-eluting balloon angioplasty. Did have an occluded popliteal as well. Follow-up lower extremity arterial Doppler studies performed 05/14/17 showed marked improvement. He did also stop smoking at that time.  Since I saw him 6 months ago he had begun hyperbaric therapy and his right stump ulcer has completely healed.   Current Meds  Medication Sig  . aspirin 81 MG chewable tablet Chew 81 mg by mouth daily.  Marland Kitchen atorvastatin (LIPITOR) 80 MG tablet Take 1 tablet (80 mg total) by mouth daily at 6 PM.  . Calcium Carbonate-Vitamin D (CALCIUM + D PO) Take 1 tablet by mouth daily.   . cholecalciferol (VITAMIN D) 1000 UNITS tablet Take 1,000 Units by mouth daily.  . clopidogrel (PLAVIX) 75 MG tablet Take 1 tablet (75 mg total) by mouth daily with breakfast.  . docusate sodium (COLACE) 100 MG capsule Take 100 mg by mouth 2 (two) times daily.  . furosemide (LASIX) 20 MG tablet Take 20 mg by  mouth daily.  Marland Kitchen glimepiride (AMARYL) 4 MG tablet Take 4 mg by mouth daily.  . hydrochlorothiazide (MICROZIDE) 12.5 MG capsule Take 12.5 mg by mouth daily.  . Insulin Glargine (TOUJEO SOLOSTAR [place]) Inject 100 Units into the skin every evening.  . leflunomide (ARAVA) 20 MG tablet Take 20 mg by mouth daily.  . Liraglutide (VICTOZA) 18 MG/3ML SOLN injection Inject 1.8 mg into the skin every evening.   . metFORMIN (GLUCOPHAGE) 1000 MG tablet Take 1,000 mg by mouth 2 (two) times daily.  Marland Kitchen morphine (MS CONTIN) 100 MG 12 hr tablet Take 100 mg by mouth every 12 (twelve) hours.  . predniSONE (DELTASONE) 1 MG tablet Take 2 mg by mouth daily with breakfast.  . pregabalin (LYRICA) 75 MG capsule Take 75-150 mg by mouth 2 (two) times daily. Take 75 mg in the morning and 150 mg in the evening  . Skin Protectants, Misc. (EUCERIN) cream Apply 1 application topically 2 (two) times daily.  . vitamin B-12 (CYANOCOBALAMIN) 100 MCG tablet Take 100 mcg by mouth daily.     Allergies  Allergen Reactions  . Varenicline Other (See Comments)    Upset stomach    Social History   Socioeconomic History  . Marital status: Divorced    Spouse name: Not on file  . Number of children: Not on file  . Years of education: Not on file  . Highest education level: Not on file  Occupational History  . Not on file  Social Needs  . Financial resource strain: Not on file  . Food insecurity:    Worry: Not on file    Inability: Not on file  . Transportation needs:    Medical: Not on file    Non-medical: Not on file  Tobacco Use  . Smoking status: Former Smoker    Packs/day: 1.00    Years: 47.00    Pack years: 47.00    Types: Cigarettes  . Smokeless tobacco: Never Used  Substance and Sexual Activity  . Alcohol use: No  . Drug use: No  . Sexual activity: Never  Lifestyle  . Physical activity:    Days per week: Not on file    Minutes per session: Not on file  . Stress: Not on file  Relationships  . Social  connections:    Talks on phone: Not on file    Gets together: Not on file    Attends religious service: Not on file    Active member of club or organization: Not on file    Attends meetings of clubs or organizations: Not on file    Relationship status: Not on file  . Intimate partner violence:    Fear of current or ex partner: Not on file    Emotionally abused: Not on file    Physically abused: Not on file    Forced sexual activity: Not on file  Other Topics Concern  . Not on file  Social History Narrative  . Not on file     Review of Systems: General: negative for chills, fever, night sweats or weight changes.  Cardiovascular: negative for chest pain, dyspnea on exertion, edema, orthopnea, palpitations, paroxysmal nocturnal dyspnea or shortness of breath Dermatological: negative for rash Respiratory: negative for cough or wheezing Urologic: negative for hematuria Abdominal: negative for nausea, vomiting, diarrhea, bright red blood per rectum, melena, or hematemesis Neurologic: negative for visual changes, syncope, or dizziness All other systems reviewed and are otherwise negative except as noted above.    Blood pressure 118/76, pulse 87, height 6\' 1"  (1.854 m), weight 179 lb 12.8 oz (81.6 kg).  General appearance: alert and no distress Neck: no adenopathy, no JVD, supple, symmetrical, trachea midline, thyroid not enlarged, symmetric, no tenderness/mass/nodules and Bilateral carotid bruits Lungs: clear to auscultation bilaterally Heart: regular rate and rhythm, S1, S2 normal, no murmur, click, rub or gallop Extremities: Right BKA with healed right stump ulcer Pulses: Palpable left pedal pulse Skin: Skin color, texture, turgor normal. No rashes or lesions Neurologic: Alert and oriented X 3, normal strength and tone. Normal symmetric reflexes. Normal coordination and gait  EKG not performed today  ASSESSMENT AND PLAN:   Critical lower limb ischemia History of critical limb  ischemia status post BKA by Dr. in 2005.  He did develop stump ulcer and underwent peripheral angiography by myself 05/05/2017 revealing occluded SFA which I performed directional atherectomy and drug-coated balloon angioplasty on with an excellent result.  Did have an occluded right popliteal artery.  Over the last 6 months he is undergoing hyperbaric therapy by Dr. 07/05/2017 and as of yesterday the wound has completely healed.  His last lower extremity Dopplers performed 07/02/2017 revealed his right SFA to be widely patent.      09/01/2017 MD FACP,FACC,FAHA, [facility] 01/21/2018 4:32 PM

## 2018-01-21 NOTE — Patient Instructions (Signed)
Medication Instructions:  Your physician recommends that you continue on your current medications as directed. Please refer to the Current Medication list given to you today.  If you need a refill on your cardiac medications before your next appointment, please call your pharmacy.   Lab work: Your physician recommends that you return for lab work in: FASTING - LIPID/LIVER  If you have labs (blood work) drawn today and your tests are completely normal, you will receive your results only by: Marland Kitchen MyChart Message (if you have MyChart) OR . A paper copy in the mail If you have any lab test that is abnormal or we need to change your treatment, we will call you to review the results.  Testing/Procedures: Your physician has requested that you have a lower extremity arterial exercise duplex. During this test, exercise and ultrasound are used to evaluate arterial blood flow in the legs. Allow one hour for this exam. There are no restrictions or special instructions. Your physician has requested that you have an ankle brachial index (ABI). During this test an ultrasound and blood pressure cuff are used to evaluate the arteries that supply the arms and legs with blood. Allow thirty minutes for this exam. There are no restrictions or special instructions.  SCHEDULE NOW  Your physician has requested that you have a carotid duplex. This test is an ultrasound of the carotid arteries in your neck. It looks at blood flow through these arteries that supply the brain with blood. Allow one hour for this exam. There are no restrictions or special instructions.  SCHEDULE FOR MARCH 2020  Follow-Up: At [facility], you and your health needs are our priority.  As part of our continuing mission to provide you with exceptional heart care, we have created designated Provider Care Teams.  These Care Teams include your primary Cardiologist (physician) and Advanced Practice Providers (APPs -  Physician Assistants and Nurse  Practitioners) who all work together to provide you with the care you need, when you need it. You will need a follow up appointment in 12 months.  Please call our office 2 months in advance to schedule this appointment.  You may see  [name] or one of the following Advanced Practice Providers on your designated Care Team:   [name], PA-C [name], [name], PA-C  Any Other Special Instructions Will Be Listed Below (If Applicable).

## 2018-01-21 NOTE — Assessment & Plan Note (Signed)
History of critical limb ischemia status post BKA by Dr. Lajoyce Corners in 2005.  He did develop stump ulcer and underwent peripheral angiography by myself 05/05/2017 revealing occluded SFA which I performed directional atherectomy and drug-coated balloon angioplasty on with an excellent result.  Did have an occluded right popliteal artery.  Over the last 6 months he is undergoing hyperbaric therapy by Dr. Leanord Hawking and as of yesterday the wound has completely healed.  His last lower extremity Dopplers performed 07/02/2017 revealed his right SFA to be widely patent.

## 2018-01-22 ENCOUNTER — Encounter: Payer: Self-pay | Admitting: Internal Medicine

## 2018-01-22 ENCOUNTER — Ambulatory Visit (INDEPENDENT_AMBULATORY_CARE_PROVIDER_SITE_OTHER): Payer: Medicare (Managed Care) | Admitting: Internal Medicine

## 2018-01-22 DIAGNOSIS — M86261 Subacute osteomyelitis, right tibia and fibula: Secondary | ICD-10-CM | POA: Diagnosis not present

## 2018-01-22 NOTE — Assessment & Plan Note (Signed)
He has no clinical or radiographic evidence of BKA stump infection.  He can follow-up here as needed.

## 2018-01-22 NOTE — Progress Notes (Signed)
[facility]  Patient Active Problem List   Diagnosis Date Noted  . Subacute osteomyelitis of right fibula (HCC)     Priority: High  . S/P unilateral BKA (below knee amputation) (HCC)     Priority: High  . Tobacco abuse counseling   . Critical lower limb ischemia 05/02/2017  . 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 Diabetes mellitus, insulin dependent (IDDM), controlled (HCC)   . Type 2 diabetes mellitus with diabetic neuropathy, with long-term current use of insulin (HCC)   . Rheumatoid arthritis (HCC)   . Gallstones 09/23/2013  . Non-intractable cyclical vomiting with nausea 09/23/2013  . Epidermal inclusion cyst 11/30/2012    Patient's Medications  New Prescriptions   No medications on file  Previous Medications   ASPIRIN 81 MG CHEWABLE TABLET    Chew 81 mg by mouth daily.   ATORVASTATIN (LIPITOR) 80 MG TABLET    Take 1 tablet (80 mg total) by mouth daily at 6 PM.   CALCIUM CARBONATE-VITAMIN D (CALCIUM + D PO)    Take 1 tablet by mouth daily.    CHOLECALCIFEROL (VITAMIN D) 1000 UNITS TABLET    Take 1,000 Units by mouth daily.   CLOPIDOGREL (PLAVIX) 75 MG TABLET    Take 1 tablet (75 mg total) by mouth daily with breakfast.   DOCUSATE SODIUM (COLACE) 100 MG CAPSULE    Take 100 mg by mouth 2 (two) times daily.   ETANERCEPT (ENBREL) 50 MG/ML INJECTION    Inject 50 mg into the skin every 7 (seven) days. Wednesdays   FUROSEMIDE (LASIX) 20 MG TABLET    Take 20 mg by mouth daily.   GLIMEPIRIDE (AMARYL) 4 MG TABLET    Take 4 mg by mouth daily.   HYDROCHLOROTHIAZIDE (MICROZIDE) 12.5 MG CAPSULE    Take 12.5 mg by mouth daily.   INSULIN GLARGINE (TOUJEO SOLOSTAR [place])    Inject 100 Units into the skin every evening.   LEFLUNOMIDE (ARAVA) 20 MG TABLET    Take 20 mg by mouth daily.   LIRAGLUTIDE (VICTOZA) 18 MG/3ML SOLN INJECTION    Inject 1.8 mg into the skin every evening.    METFORMIN (GLUCOPHAGE) 1000 MG  TABLET    Take 1,000 mg by mouth 2 (two) times daily.   MORPHINE (MS CONTIN) 100 MG 12 HR TABLET    Take 100 mg by mouth every 12 (twelve) hours.   PREDNISONE (DELTASONE) 1 MG TABLET    Take 2 mg by mouth daily with breakfast.   PREGABALIN (LYRICA) 75 MG CAPSULE    Take 75-150 mg by mouth 2 (two) times daily. Take 75 mg in the morning and 150 mg in the evening   SKIN PROTECTANTS, MISC. (EUCERIN) CREAM    Apply 1 application topically 2 (two) times daily.   VITAMIN B-12 (CYANOCOBALAMIN) 100 MCG TABLET    Take 100 mcg by mouth daily.  Modified Medications   No medications on file  Discontinued Medications   No medications on file    Subjective: Brian Zavala is in for his routine follow-up.  His left foot cellulitis resolved promptly after his last visit and he completed his levofloxacin.  He has had no redness, swelling, drainage or new pain in his right BKA stump.  Review of Systems: Review of Systems  Constitutional: Negative for chills, fever and malaise/fatigue.  Gastrointestinal: Negative for abdominal pain, diarrhea and nausea.  Musculoskeletal: Negative for joint pain.  Past Medical History:  Diagnosis Date  . Arthritis    "pretty much all over"  . COPD (chronic obstructive pulmonary disease) (HCC)   . Hyperlipidemia   . Hypertension   . Neuromuscular disorder (HCC)   . Neuropathy   . Type II diabetes mellitus (HCC)   . Wears glasses   . Wears partial dentures    top and bottom partials    Social History   Tobacco Use  . Smoking status: Former Smoker    Packs/day: 1.00    Years: 47.00    Pack years: 47.00    Types: Cigarettes  . Smokeless tobacco: Never Used  Substance Use Topics  . Alcohol use: No  . Drug use: No    Family History  Problem Relation Age of Onset  . Cirrhosis Father     Allergies  Allergen Reactions  . Varenicline Other (See Comments)    Upset stomach    Objective: Vitals:   01/22/18 0915  BP: 138/80  Pulse: 93  Temp: 97.6 F (36.4  C)   There is no height or weight on file to calculate BMI.  Physical Exam  Constitutional: He is oriented to person, place, and time.  He is in good spirits.  He is seated in his wheelchair.  Musculoskeletal:  He has a dry, scarred area on the lateral aspect of his right BKA stump.  There is no redness, drainage, fluctuance or other signs of infection.  His left foot cellulitis has resolved.  Neurological: He is alert and oriented to person, place, and time.  Skin: No rash noted.  Psychiatric: He has a normal mood and affect.    Lab Results    Problem List Items Addressed This Visit      High   Subacute osteomyelitis of right fibula [facility])    He has no clinical or radiographic evidence of BKA stump infection.  He can follow-up here as needed.          Cliffton Asters, MD [facility] [phone] pager   [phone] cell 01/22/2018, 9:29 AM

## 2018-02-05 ENCOUNTER — Ambulatory Visit (HOSPITAL_COMMUNITY)
Admission: RE | Admit: 2018-02-05 | Discharge: 2018-02-05 | Disposition: A | Discharge status: Home / Self Care | Payer: Medicare (Managed Care) | Source: Ambulatory Visit | Attending: Cardiovascular Disease | Admitting: Cardiovascular Disease

## 2018-02-05 DIAGNOSIS — I998 Other disorder of circulatory system: Secondary | ICD-10-CM | POA: Insufficient documentation

## 2018-02-05 DIAGNOSIS — I70229 Atherosclerosis of native arteries of extremities with rest pain, unspecified extremity: Secondary | ICD-10-CM

## 2018-02-05 DIAGNOSIS — I739 Peripheral vascular disease, unspecified: Secondary | ICD-10-CM | POA: Insufficient documentation

## 2018-02-09 ENCOUNTER — Other Ambulatory Visit: Payer: Self-pay | Admitting: *Deleted

## 2018-02-09 DIAGNOSIS — I739 Peripheral vascular disease, unspecified: Secondary | ICD-10-CM

## 2018-04-04 DIAGNOSIS — M869 Osteomyelitis, unspecified: Secondary | ICD-10-CM

## 2018-04-04 HISTORY — DX: Osteomyelitis, unspecified: M86.9

## 2018-04-18 ENCOUNTER — Emergency Department (HOSPITAL_COMMUNITY): Payer: Medicare (Managed Care)

## 2018-04-18 ENCOUNTER — Encounter (HOSPITAL_COMMUNITY): Payer: Self-pay | Admitting: Emergency Medicine

## 2018-04-18 ENCOUNTER — Other Ambulatory Visit: Payer: Self-pay

## 2018-04-18 ENCOUNTER — Inpatient Hospital Stay (HOSPITAL_COMMUNITY): Payer: Medicare (Managed Care)

## 2018-04-18 ENCOUNTER — Inpatient Hospital Stay (HOSPITAL_COMMUNITY)
Admission: EM | Admit: 2018-04-18 | Discharge: 2018-04-23 | DRG: 617 | Disposition: A | Discharge status: Home / Self Care | Payer: Medicare (Managed Care) | Attending: Internal Medicine | Admitting: Internal Medicine

## 2018-04-18 DIAGNOSIS — E1165 Type 2 diabetes mellitus with hyperglycemia: Secondary | ICD-10-CM | POA: Diagnosis not present

## 2018-04-18 DIAGNOSIS — J449 Chronic obstructive pulmonary disease, unspecified: Secondary | ICD-10-CM | POA: Diagnosis present

## 2018-04-18 DIAGNOSIS — Z7902 Long term (current) use of antithrombotics/antiplatelets: Secondary | ICD-10-CM

## 2018-04-18 DIAGNOSIS — Z888 Allergy status to other drugs, medicaments and biological substances status: Secondary | ICD-10-CM

## 2018-04-18 DIAGNOSIS — Z87891 Personal history of nicotine dependence: Secondary | ICD-10-CM | POA: Diagnosis not present

## 2018-04-18 DIAGNOSIS — E1140 Type 2 diabetes mellitus with diabetic neuropathy, unspecified: Secondary | ICD-10-CM

## 2018-04-18 DIAGNOSIS — G709 Myoneural disorder, unspecified: Secondary | ICD-10-CM | POA: Diagnosis present

## 2018-04-18 DIAGNOSIS — E1152 Type 2 diabetes mellitus with diabetic peripheral angiopathy with gangrene: Secondary | ICD-10-CM | POA: Diagnosis present

## 2018-04-18 DIAGNOSIS — E1169 Type 2 diabetes mellitus with other specified complication: Secondary | ICD-10-CM | POA: Diagnosis present

## 2018-04-18 DIAGNOSIS — I96 Gangrene, not elsewhere classified: Secondary | ICD-10-CM | POA: Diagnosis not present

## 2018-04-18 DIAGNOSIS — M159 Polyosteoarthritis, unspecified: Secondary | ICD-10-CM | POA: Diagnosis present

## 2018-04-18 DIAGNOSIS — Z794 Long term (current) use of insulin: Secondary | ICD-10-CM

## 2018-04-18 DIAGNOSIS — I739 Peripheral vascular disease, unspecified: Secondary | ICD-10-CM | POA: Diagnosis present

## 2018-04-18 DIAGNOSIS — L02612 Cutaneous abscess of left foot: Secondary | ICD-10-CM | POA: Diagnosis present

## 2018-04-18 DIAGNOSIS — E1142 Type 2 diabetes mellitus with diabetic polyneuropathy: Secondary | ICD-10-CM | POA: Diagnosis present

## 2018-04-18 DIAGNOSIS — M868X7 Other osteomyelitis, ankle and foot: Secondary | ICD-10-CM | POA: Diagnosis not present

## 2018-04-18 DIAGNOSIS — IMO0001 Reserved for inherently not codable concepts without codable children: Secondary | ICD-10-CM

## 2018-04-18 DIAGNOSIS — Z955 Presence of coronary angioplasty implant and graft: Secondary | ICD-10-CM

## 2018-04-18 DIAGNOSIS — I10 Essential (primary) hypertension: Secondary | ICD-10-CM | POA: Diagnosis present

## 2018-04-18 DIAGNOSIS — E11628 Type 2 diabetes mellitus with other skin complications: Secondary | ICD-10-CM | POA: Diagnosis present

## 2018-04-18 DIAGNOSIS — Z89422 Acquired absence of other left toe(s): Secondary | ICD-10-CM | POA: Diagnosis not present

## 2018-04-18 DIAGNOSIS — M069 Rheumatoid arthritis, unspecified: Secondary | ICD-10-CM | POA: Diagnosis present

## 2018-04-18 DIAGNOSIS — E11649 Type 2 diabetes mellitus with hypoglycemia without coma: Secondary | ICD-10-CM | POA: Diagnosis present

## 2018-04-18 DIAGNOSIS — L03116 Cellulitis of left lower limb: Secondary | ICD-10-CM | POA: Diagnosis present

## 2018-04-18 DIAGNOSIS — E785 Hyperlipidemia, unspecified: Secondary | ICD-10-CM | POA: Diagnosis present

## 2018-04-18 DIAGNOSIS — E11621 Type 2 diabetes mellitus with foot ulcer: Secondary | ICD-10-CM | POA: Diagnosis present

## 2018-04-18 DIAGNOSIS — Z7982 Long term (current) use of aspirin: Secondary | ICD-10-CM | POA: Diagnosis not present

## 2018-04-18 DIAGNOSIS — L089 Local infection of the skin and subcutaneous tissue, unspecified: Secondary | ICD-10-CM | POA: Diagnosis present

## 2018-04-18 DIAGNOSIS — Z89511 Acquired absence of right leg below knee: Secondary | ICD-10-CM

## 2018-04-18 DIAGNOSIS — E119 Type 2 diabetes mellitus without complications: Secondary | ICD-10-CM

## 2018-04-18 DIAGNOSIS — Z7984 Long term (current) use of oral hypoglycemic drugs: Secondary | ICD-10-CM

## 2018-04-18 DIAGNOSIS — M869 Osteomyelitis, unspecified: Secondary | ICD-10-CM | POA: Diagnosis present

## 2018-04-18 HISTORY — DX: Osteomyelitis, unspecified: M86.9

## 2018-04-18 LAB — CBC WITH DIFFERENTIAL/PLATELET
Abs Immature Granulocytes: 0.04 10*3/uL (ref 0.00–0.07)
Basophils Absolute: 0.1 10*3/uL (ref 0.0–0.1)
Basophils Relative: 1 %
Eosinophils Absolute: 0.2 10*3/uL (ref 0.0–0.5)
Eosinophils Relative: 2 %
HCT: 41.7 % (ref 39.0–52.0)
Hemoglobin: 13 g/dL (ref 13.0–17.0)
Immature Granulocytes: 0 %
Lymphocytes Relative: 14 %
Lymphs Abs: 1.4 10*3/uL (ref 0.7–4.0)
MCH: 26.1 pg (ref 26.0–34.0)
MCHC: 31.2 g/dL (ref 30.0–36.0)
MCV: 83.7 fL (ref 80.0–100.0)
Monocytes Absolute: 0.9 10*3/uL (ref 0.1–1.0)
Monocytes Relative: 9 %
Neutro Abs: 7.4 10*3/uL (ref 1.7–7.7)
Neutrophils Relative %: 74 %
Platelets: 242 10*3/uL (ref 150–400)
RBC: 4.98 MIL/uL (ref 4.22–5.81)
RDW: 15.5 % (ref 11.5–15.5)
WBC: 10 10*3/uL (ref 4.0–10.5)
nRBC: 0 % (ref 0.0–0.2)

## 2018-04-18 LAB — COMPREHENSIVE METABOLIC PANEL
ALT: 15 U/L (ref 0–44)
AST: 18 U/L (ref 15–41)
Albumin: 3.3 g/dL — ABNORMAL LOW (ref 3.5–5.0)
Alkaline Phosphatase: 50 U/L (ref 38–126)
Anion gap: 10 (ref 5–15)
BUN: 8 mg/dL (ref 8–23)
CO2: 26 mmol/L (ref 22–32)
Calcium: 8.8 mg/dL — ABNORMAL LOW (ref 8.9–10.3)
Chloride: 97 mmol/L — ABNORMAL LOW (ref 98–111)
Creatinine, Ser: 0.82 mg/dL (ref 0.61–1.24)
GFR calc Af Amer: >60 mL/min (ref >60)
GFR calc non Af Amer: >60 mL/min (ref >60)
Glucose, Bld: 158 mg/dL — ABNORMAL HIGH (ref 70–99)
Potassium: 3.6 mmol/L (ref 3.5–5.1)
Sodium: 133 mmol/L — ABNORMAL LOW (ref 135–145)
Total Bilirubin: 0.8 mg/dL (ref 0.3–1.2)
Total Protein: 7.2 g/dL (ref 6.5–8.1)

## 2018-04-18 LAB — URINALYSIS, ROUTINE W REFLEX MICROSCOPIC
Bilirubin Urine: NEGATIVE
Glucose, UA: NEGATIVE mg/dL
Hgb urine dipstick: NEGATIVE
Ketones, ur: NEGATIVE mg/dL
Leukocytes,Ua: NEGATIVE
Nitrite: NEGATIVE
Protein, ur: NEGATIVE mg/dL
Specific Gravity, Urine: 1.013 (ref 1.005–1.030)
pH: 6 (ref 5.0–8.0)

## 2018-04-18 LAB — GLUCOSE, CAPILLARY
Glucose-Capillary: 171 mg/dL — ABNORMAL HIGH (ref 70–99)
Glucose-Capillary: 66 mg/dL — ABNORMAL LOW (ref 70–99)
Glucose-Capillary: 74 mg/dL (ref 70–99)
Glucose-Capillary: 108 mg/dL — ABNORMAL HIGH (ref 70–99)

## 2018-04-18 LAB — CBG MONITORING, ED: Glucose-Capillary: 140 mg/dL — ABNORMAL HIGH (ref 70–99)

## 2018-04-18 LAB — LACTIC ACID, PLASMA: Lactic Acid, Venous: 1.8 mmol/L (ref 0.5–1.9)

## 2018-04-18 LAB — SEDIMENTATION RATE: Sed Rate: 46 mm/hr — ABNORMAL HIGH (ref 0–16)

## 2018-04-18 LAB — C-REACTIVE PROTEIN: CRP: 6.8 mg/dL — ABNORMAL HIGH (ref <1.0)

## 2018-04-18 MED ORDER — VITAMIN B-12 100 MCG PO TABS
100.0000 ug | ORAL_TABLET | Freq: Every day | ORAL | Status: DC
  Administered 2018-04-19 – 2018-04-23 (×5): 100 ug via ORAL
  Filled 2018-04-18 (×5): qty 1

## 2018-04-18 MED ORDER — INSULIN GLARGINE 100 UNIT/ML ~~LOC~~ SOLN
75.0000 [IU] | Freq: Every evening | SUBCUTANEOUS | Status: DC
  Filled 2018-04-18: qty 0.75

## 2018-04-18 MED ORDER — ATORVASTATIN CALCIUM 80 MG PO TABS
80.0000 mg | ORAL_TABLET | Freq: Every day | ORAL | Status: DC
  Administered 2018-04-18 – 2018-04-22 (×5): 80 mg via ORAL
  Filled 2018-04-18 (×5): qty 1

## 2018-04-18 MED ORDER — MORPHINE SULFATE ER 100 MG PO TBCR
100.0000 mg | EXTENDED_RELEASE_TABLET | Freq: Two times a day (BID) | ORAL | Status: DC
  Administered 2018-04-18 – 2018-04-23 (×10): 100 mg via ORAL
  Filled 2018-04-18 (×10): qty 1

## 2018-04-18 MED ORDER — ACETAMINOPHEN 650 MG RE SUPP: 650.0000 mg | Freq: Four times a day (QID) | RECTAL | Status: DC | PRN

## 2018-04-18 MED ORDER — PREGABALIN 75 MG PO CAPS
150.0000 mg | ORAL_CAPSULE | Freq: Every day | ORAL | Status: DC
  Administered 2018-04-18 – 2018-04-22 (×5): 150 mg via ORAL
  Filled 2018-04-18 (×5): qty 2

## 2018-04-18 MED ORDER — SODIUM CHLORIDE 0.9 % IV SOLN
2.0000 g | INTRAVENOUS | Status: DC
  Administered 2018-04-18 – 2018-04-22 (×5): 2 g via INTRAVENOUS
  Filled 2018-04-18 (×6): qty 20

## 2018-04-18 MED ORDER — HYDROCHLOROTHIAZIDE 12.5 MG PO CAPS
12.5000 mg | ORAL_CAPSULE | Freq: Every day | ORAL | Status: DC
  Administered 2018-04-19 – 2018-04-23 (×5): 12.5 mg via ORAL
  Filled 2018-04-18 (×6): qty 1

## 2018-04-18 MED ORDER — ACETAMINOPHEN 325 MG PO TABS: 650.0000 mg | ORAL_TABLET | Freq: Four times a day (QID) | ORAL | Status: DC | PRN

## 2018-04-18 MED ORDER — SALINE SPRAY 0.65 % NA SOLN
1.0000 | NASAL | Status: DC | PRN
  Administered 2018-04-18: 1 via NASAL
  Filled 2018-04-18: qty 44

## 2018-04-18 MED ORDER — VANCOMYCIN HCL 10 G IV SOLR
1500.0000 mg | Freq: Once | INTRAVENOUS | Status: AC
  Administered 2018-04-18: 1500 mg via INTRAVENOUS
  Filled 2018-04-18: qty 1500

## 2018-04-18 MED ORDER — METRONIDAZOLE IN NACL 5-0.79 MG/ML-% IV SOLN
500.0000 mg | Freq: Three times a day (TID) | INTRAVENOUS | Status: DC
  Administered 2018-04-18 – 2018-04-20 (×6): 500 mg via INTRAVENOUS
  Filled 2018-04-18 (×6): qty 100

## 2018-04-18 MED ORDER — PREDNISONE 1 MG PO TABS
2.0000 mg | ORAL_TABLET | Freq: Every day | ORAL | Status: DC
  Administered 2018-04-19 – 2018-04-23 (×5): 2 mg via ORAL
  Filled 2018-04-18 (×5): qty 2

## 2018-04-18 MED ORDER — ENOXAPARIN SODIUM 40 MG/0.4ML ~~LOC~~ SOLN
40.0000 mg | SUBCUTANEOUS | Status: DC
  Administered 2018-04-18 – 2018-04-20 (×3): 40 mg via SUBCUTANEOUS
  Filled 2018-04-18 (×3): qty 0.4

## 2018-04-18 MED ORDER — POTASSIUM CHLORIDE 2 MEQ/ML IV SOLN
INTRAVENOUS | Status: DC
  Filled 2018-04-18: qty 1000

## 2018-04-18 MED ORDER — INSULIN ASPART 100 UNIT/ML ~~LOC~~ SOLN
6.0000 [IU] | Freq: Three times a day (TID) | SUBCUTANEOUS | Status: DC
  Administered 2018-04-18: 6 [IU] via SUBCUTANEOUS

## 2018-04-18 MED ORDER — POTASSIUM CHLORIDE 2 MEQ/ML IV SOLN
INTRAVENOUS | Status: AC
  Administered 2018-04-18 – 2018-04-19 (×2): via INTRAVENOUS
  Filled 2018-04-18 (×2): qty 1000

## 2018-04-18 MED ORDER — VANCOMYCIN HCL 10 G IV SOLR
1250.0000 mg | Freq: Two times a day (BID) | INTRAVENOUS | Status: DC
  Administered 2018-04-19: 1250 mg via INTRAVENOUS
  Filled 2018-04-18 (×2): qty 1250

## 2018-04-18 MED ORDER — PREGABALIN 75 MG PO CAPS
75.0000 mg | ORAL_CAPSULE | ORAL | Status: DC
  Administered 2018-04-19 – 2018-04-23 (×5): 75 mg via ORAL
  Filled 2018-04-18 (×6): qty 1

## 2018-04-18 MED ORDER — INSULIN ASPART 100 UNIT/ML ~~LOC~~ SOLN
0.0000 [IU] | Freq: Every day | SUBCUTANEOUS | Status: DC
  Administered 2018-04-22: 5 [IU] via SUBCUTANEOUS

## 2018-04-18 MED ORDER — LEFLUNOMIDE 20 MG PO TABS
20.0000 mg | ORAL_TABLET | Freq: Every day | ORAL | Status: DC
  Administered 2018-04-19 – 2018-04-23 (×5): 20 mg via ORAL
  Filled 2018-04-18 (×5): qty 1

## 2018-04-18 MED ORDER — INSULIN ASPART 100 UNIT/ML ~~LOC~~ SOLN
0.0000 [IU] | Freq: Three times a day (TID) | SUBCUTANEOUS | Status: DC
  Administered 2018-04-18 – 2018-04-19 (×2): 4 [IU] via SUBCUTANEOUS
  Administered 2018-04-19 – 2018-04-20 (×2): 7 [IU] via SUBCUTANEOUS
  Administered 2018-04-20: 15 [IU] via SUBCUTANEOUS
  Administered 2018-04-21: 11 [IU] via SUBCUTANEOUS
  Administered 2018-04-21 (×2): 15 [IU] via SUBCUTANEOUS
  Administered 2018-04-22: 20 [IU] via SUBCUTANEOUS
  Administered 2018-04-22: 11 [IU] via SUBCUTANEOUS
  Administered 2018-04-22: 3 [IU] via SUBCUTANEOUS
  Administered 2018-04-22 – 2018-04-23 (×2): 4 [IU] via SUBCUTANEOUS

## 2018-04-18 NOTE — ED Notes (Signed)
ED TO INPATIENT HANDOFF REPORT  Name/Age/Gender Brian Zavala 68 y.o. male  Code Status    Code Status Orders  (From admission, onward)         Start     Ordered   04/18/18 1520  Full code  Continuous     04/18/18 1520        Code Status History    Date Active Date Inactive Code Status Order ID Comments User Context   05/05/2017 1315 05/06/2017 1522 Full Code [id number]  [name], MD Inpatient   12/02/2014 1846 12/04/2014 1617 Full Code [id number]  [name], MD Inpatient      Home/SNF/Other Home  Chief Complaint sore on toe  Level of Care/Admitting Diagnosis ED Disposition    ED Disposition Condition Comment   Admit  Hospital Area: [facility] [[id number]]  Level of Care: Med-Surg [16]  Diagnosis: Diabetic foot infection [facility]) [[id number]]  Admitting Physician: Anne Shutter [[id number]]  Attending Physician: Anne Shutter [[id number]]  Estimated length of stay: past midnight tomorrow  Certification:: I certify this patient will need inpatient services for at least 2 midnights  PT Class (Do Not Modify): Inpatient [101]  PT Acc Code (Do Not Modify): Private [1]       Medical History Past Medical History:  Diagnosis Date  . Arthritis    "pretty much all over"  . COPD (chronic obstructive pulmonary disease) (HCC)   . Hyperlipidemia   . Hypertension   . Neuromuscular disorder (HCC)   . Neuropathy   . Type II diabetes mellitus (HCC)   . Wears glasses   . Wears partial dentures    top and bottom partials    Allergies Allergies  Allergen Reactions  . Varenicline Other (See Comments)    Upset stomach    IV Location/Drains/Wounds Patient Lines/Drains/Airways Status   Active Line/Drains/Airways    Name:   Placement date:   Placement time:   Site:   Days:   Peripheral IV 12/03/14 Left;Lateral Forearm   12/03/14    0520    Forearm   1232   Peripheral IV 04/18/18 Right Forearm   04/18/18    1141    Forearm   less than 1   Incision (Closed) 12/02/14 Rectum Other (Comment)   12/02/14    2105     1233   Incision (Closed) 02/15/15 Finger (Comment which one) Left   02/15/15    1105     1158   Incision (Closed) 02/15/15 Finger (Comment which one) Right   02/15/15    1105     1158   Incision (Closed) 03/07/17 Leg Right   03/07/17    1131     407   Incision (Closed) 05/05/17 Knee Right;Other (Comment)   05/05/17    1936     348          Labs/Imaging Results for orders placed or performed during the hospital encounter of 04/18/18 (from the past 48 hour(s))  CBG monitoring, ED     Status: Abnormal   Collection Time: 04/18/18 11:29 AM  Result Value Ref Range   Glucose-Capillary 140 (H) 70 - 99 mg/dL  Sedimentation rate     Status: Abnormal   Collection Time: 04/18/18 11:40 AM  Result Value Ref Range   Sed Rate 46 (H) 0 - 16 mm/hr    Comment: Performed at [facility], [address] [place], [place] [address]  Comprehensive metabolic panel  Status: Abnormal   Collection Time: 04/18/18 11:40 AM  Result Value Ref Range   Sodium 133 (L) 135 - 145 mmol/L   Potassium 3.6 3.5 - 5.1 mmol/L   Chloride 97 (L) 98 - 111 mmol/L   CO2 26 22 - 32 mmol/L   Glucose, Bld 158 (H) 70 - 99 mg/dL   BUN 8 8 - 23 mg/dL   Creatinine, Ser 1.61 0.61 - 1.24 mg/dL   Calcium 8.8 (L) 8.9 - 10.3 mg/dL   Total Protein 7.2 6.5 - 8.1 g/dL   Albumin 3.3 (L) 3.5 - 5.0 g/dL   AST 18 15 - 41 U/L   ALT 15 0 - 44 U/L   Alkaline Phosphatase 50 38 - 126 U/L   Total Bilirubin 0.8 0.3 - 1.2 mg/dL   GFR calc non Af Amer >60 >60 mL/min   GFR calc Af Amer >60 >60 mL/min   Anion gap 10 5 - 15    Comment: Performed at [facility], [address] [place], [place] [address]  CBC WITH DIFFERENTIAL     Status: None   Collection Time: 04/18/18 11:40 AM  Result Value Ref Range   WBC 10.0 4.0 - 10.5 K/uL   RBC 4.98 4.22 - 5.81 MIL/uL   Hemoglobin 13.0 13.0 - 17.0 g/dL   HCT 54.0 98.1 - 19.1 %   MCV 83.7 80.0 - 100.0 fL   MCH 26.1  26.0 - 34.0 pg   MCHC 31.2 30.0 - 36.0 g/dL   RDW 47.8 29.5 - 62.1 %   Platelets 242 150 - 400 K/uL   nRBC 0.0 0.0 - 0.2 %   Neutrophils Relative % 74 %   Neutro Abs 7.4 1.7 - 7.7 K/uL   Lymphocytes Relative 14 %   Lymphs Abs 1.4 0.7 - 4.0 K/uL   Monocytes Relative 9 %   Monocytes Absolute 0.9 0.1 - 1.0 K/uL   Eosinophils Relative 2 %   Eosinophils Absolute 0.2 0.0 - 0.5 K/uL   Basophils Relative 1 %   Basophils Absolute 0.1 0.0 - 0.1 K/uL   Immature Granulocytes 0 %   Abs Immature Granulocytes 0.04 0.00 - 0.07 K/uL    Comment: Performed at [facility], [address] [place], [place] [address]  Lactic acid, plasma     Status: None   Collection Time: 04/18/18 11:43 AM  Result Value Ref Range   Lactic Acid, Venous 1.8 0.5 - 1.9 mmol/L    Comment: Performed at [facility], [address], [place], [place] [address]   Dg Foot Complete Left  Result Date: 04/18/2018 CLINICAL DATA:  Sore on fourth toe.  Redness and swelling. EXAM: LEFT FOOT - COMPLETE 3+ VIEW COMPARISON:  None. FINDINGS: There is an ulceration on the fourth toe. There is associated swelling of the fourth toe. The distal phalanx of the fourth toe is not well assessed. There does appear to be some mild irregularity underlying the ulceration. No other acute abnormalities on today's study. There is a large plantar spur. Degenerative changes are seen in the midfoot. Soft tissue swelling over the top of the foot. IMPRESSION: 1. There appears to be an ulceration in the soft tissues of the distal fourth toe. The foci of air may be in the ulceration or in the adjacent soft tissues. There is irregularity of the underlying coughed which is incompletely evaluated on today's study. Findings are concerning for osteomyelitis. An MRI could further assess. Electronically Signed  By: Gerome Samavid  Williams III M.D   On: 04/18/2018 14:14    Pending Labs Unresulted Labs (From admission, onward)    Start     Ordered   04/19/18 0500   HIV antibody (Routine Testing)  Tomorrow morning,   R     04/18/18 1520   04/19/18 0500  CBC  Tomorrow morning,   R     04/18/18 1520   04/19/18 0500  Comprehensive metabolic panel  Tomorrow morning,   R     04/18/18 1520   04/19/18 0500  Hemoglobin A1c  Tomorrow morning,   R    Comments:  To assess prior glycemic control    04/18/18 1550   04/18/18 1343  C-reactive protein  Once,   R     04/18/18 1343   04/18/18 1143  Blood Culture (routine x 2)  BLOOD CULTURE X 2,   STAT     04/18/18 1142   04/18/18 1143  Urinalysis, Routine w reflex microscopic  ONCE - STAT,   STAT     04/18/18 1142          Vitals/Pain Today's Vitals   04/18/18 1400 04/18/18 1430 04/18/18 1500 04/18/18 1530  BP: 106/74 104/71 124/82 106/80  Pulse: 90 80 78 89  Resp: (!) 26 14 14  (!) 25  Temp:      TempSrc:      SpO2: 92% 93% 94% 96%  Weight:      Height:      PainSc:        Isolation Precautions No active isolations  Medications Medications  cefTRIAXone (ROCEPHIN) 2 g in sodium chloride 0.9 % 100 mL IVPB ( Intravenous Stopped 04/18/18 1252)  metroNIDAZOLE (FLAGYL) IVPB 500 mg ( Intravenous Stopped 04/18/18 1321)  enoxaparin (LOVENOX) injection 30 mg (has no administration in time range)  acetaminophen (TYLENOL) tablet 650 mg (has no administration in time range)    Or  acetaminophen (TYLENOL) suppository 650 mg (has no administration in time range)  atorvastatin (LIPITOR) tablet 80 mg (has no administration in time range)  hydrochlorothiazide (MICROZIDE) capsule 12.5 mg (has no administration in time range)  leflunomide (ARAVA) tablet 20 mg (has no administration in time range)  morphine (MS CONTIN) 12 hr tablet 100 mg (has no administration in time range)  predniSONE (DELTASONE) tablet 2 mg (has no administration in time range)  pregabalin (LYRICA) capsule 75 mg (has no administration in time range)  vitamin B-12 (CYANOCOBALAMIN) tablet 100 mcg (has no administration in time range)  Insulin  Glargine (1 Unit Dial) SOPN 75 Units (has no administration in time range)  pregabalin (LYRICA) capsule 150 mg (has no administration in time range)  insulin aspart (novoLOG) injection 0-20 Units (has no administration in time range)  insulin aspart (novoLOG) injection 0-5 Units (has no administration in time range)  insulin aspart (novoLOG) injection 6 Units (has no administration in time range)  lactated ringers 1,000 mL with potassium chloride 10 mEq infusion (has no administration in time range)  vancomycin (VANCOCIN) 1,500 mg in sodium chloride 0.9 % 500 mL IVPB (0 mg Intravenous Stopped 04/18/18 1549)    Mobility manual wheelchair

## 2018-04-18 NOTE — ED Provider Notes (Signed)
[facility] EMERGENCY [facility]MENT Provider Note   CSN: [id number] Arrival date & time: 04/18/18  1112     History   Chief Complaint Chief Complaint  Patient presents with  . Sore    Sore of left 4th toe    HPI Brian Zavala is a 68 y.o. male.  The history is provided by the patient.  Foot Pain  This is a new problem. The current episode started yesterday. The problem occurs constantly. The problem has not changed since onset.Pertinent negatives include no chest pain, no abdominal pain, no headaches and no shortness of breath. Associated symptoms comments: Patient concerned about left foot infection, left 4th toe red and swollen with redness extending into his forefoot and ankle. Nothing aggravates the symptoms. Nothing relieves the symptoms. The treatment provided no relief.    Past Medical History:  Diagnosis Date  . Arthritis    "pretty much all over"  . COPD (chronic obstructive pulmonary disease) (HCC)   . Hyperlipidemia   . Hypertension   . Neuromuscular disorder (HCC)   . Neuropathy   . Type II diabetes mellitus (HCC)   . Wears glasses   . Wears partial dentures    top and bottom partials    Patient Active Problem List   Diagnosis Date Noted  . Diabetic foot infection (HCC) 04/18/2018  . Tobacco abuse counseling   . Critical lower limb ischemia 05/02/2017  . Subacute osteomyelitis of right fibula (HCC)   . 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 Diabetes mellitus, insulin dependent (IDDM), controlled (HCC)   . Type 2 diabetes mellitus with diabetic neuropathy, with long-term current use of insulin (HCC)   . S/P unilateral BKA (below knee amputation) (HCC)   . Rheumatoid arthritis (HCC)   . Gallstones 09/23/2013  . Non-intractable cyclical vomiting with nausea 09/23/2013  . Epidermal inclusion cyst 11/30/2012    Past Surgical History:  Procedure Laterality Date  . BACK SURGERY    .  CHOLECYSTECTOMY N/A 10/26/2013   Procedure: LAPAROSCOPIC CHOLECYSTECTOMY WITH INTRAOPERATIVE CHOLANGIOGRAM;  Surgeon: Clovis Puhomas A. Cornett, MD;  Location: [facility] OR;  Service: General;  Laterality: N/A;  . COLONOSCOPY    . CYST EXCISION Bilateral 02/15/2015   Procedure: LEFT INDEX FINGER AND RIGHT MIDDLE FINGER NODULE EXCISION;  Surgeon: Tarry KosNaiping M Xu, MD;  Location: [facility];  Service: Orthopedics;  Laterality: Bilateral;  . CYST EXCISION PERINEAL N/A 12/08/2012   Procedure: CYST EXCISION PERINeum;  Surgeon: Maisie Fushomas A. Cornett, MD;  Location: [facility];  Service: General;  Laterality: N/A;  . FOOT AMPUTATION Right 2005  . I&D EXTREMITY Right 03/07/2017   Procedure: EXCISION FIBULAR HEAD RIGHT BELOW KNEE AMPUTATION;  Surgeon: Nadara Mustarduda, Marcus V, MD;  [facility];  Service: Orthopedics;  Laterality: Right;  . INCISE AND DRAIN ABCESS  12/02/2014   PERINEAL ABSCESS  . INCISION AND DRAINAGE PERIRECTAL ABSCESS Left 12/02/2014   Procedure: IRRIGATION AND DEBRIDEMENT PERINEAL ABSCESS;  Surgeon: Abigail Miyamotoouglas Blackman, MD;  Location: [facility] OR;  Service: General;  Laterality: Left;  . LEG AMPUTATION BELOW KNEE Right 2005  . LOWER EXTREMITY ANGIOGRAPHY N/A 05/05/2017   Procedure: LOWER EXTREMITY ANGIOGRAPHY;  Surgeon: Runell GessBerry, Jonathan J, MD;  Location: [facility] INVASIVE CV LAB;  Service: Cardiovascular;  Laterality: N/A;  . LUMBAR DISC SURGERY  82,90   ruptured disc  . PERIPHERAL VASCULAR INTERVENTION Right 05/05/2017   Procedure: PERIPHERAL VASCULAR INTERVENTION;  Surgeon: Runell GessBerry, Jonathan J, MD;  [facility];  Service: Cardiovascular;  Laterality: Right;  . SCAR REVISION Right 2005   @ amputation  . TOE AMPUTATION Right 2005        Home Medications    Prior to Admission medications   Medication Sig Start Date End Date Taking? Authorizing Provider  aspirin 81 MG chewable tablet Chew 81 mg by mouth daily.   Yes [provider]  atorvastatin (LIPITOR) 80 MG tablet Take 1  tablet (80 mg total) by mouth daily at 6 PM. 05/06/17  Yes Laverda Pageoberts, Lindsay B, NP  Calcium Carbonate-Vitamin D (CALCIUM + D PO) Take 1 tablet by mouth daily.    Yes [provider]  cholecalciferol (VITAMIN D) 1000 UNITS tablet Take 1,000 Units by mouth daily.   Yes [provider]  clopidogrel (PLAVIX) 75 MG tablet Take 1 tablet (75 mg total) by mouth daily with breakfast. 05/07/17  Yes Laverda Pageoberts, Lindsay B, NP  docusate sodium (COLACE) 100 MG capsule Take 100 mg by mouth 2 (two) times daily.   Yes [provider]  etanercept (ENBREL) 50 MG/ML injection Inject 50 mg into the skin every 7 (seven) days. Wednesdays   Yes [provider]  furosemide (LASIX) 20 MG tablet Take 20 mg by mouth daily as needed for fluid.    Yes [provider]  hydrochlorothiazide (MICROZIDE) 12.5 MG capsule Take 12.5 mg by mouth daily.   Yes [provider]  Insulin Glargine (TOUJEO SOLOSTAR [facility]) Inject 135 Units into the skin every evening.    Yes [provider]  leflunomide (ARAVA) 20 MG tablet Take 20 mg by mouth daily.   Yes [provider]  Liraglutide (VICTOZA) 18 MG/3ML SOLN injection Inject 1.8 mg into the skin every evening.    Yes [provider]  metFORMIN (GLUCOPHAGE) 1000 MG tablet Take 1,000 mg by mouth 2 (two) times daily.   Yes [provider]  morphine (MS CONTIN) 100 MG 12 hr tablet Take 100 mg by mouth every 12 (twelve) hours.   Yes [provider]  predniSONE (DELTASONE) 1 MG tablet Take 2 mg by mouth daily with breakfast.   Yes [provider]  pregabalin (LYRICA) 75 MG capsule Take 75-150 mg by mouth 2 (two) times daily. Take 75 mg in the morning and 150 mg in the evening   Yes [provider]  Skin Protectants, Misc. (EUCERIN) cream Apply 1 application topically 2 (two) times daily.   Yes [provider]  vitamin B-12 (CYANOCOBALAMIN) 100 MCG tablet Take 100 mcg by mouth daily.   Yes  [provider]    Family History Family History  Problem Relation Age of Onset  . Cirrhosis Father     Social History Social History   Tobacco Use  . Smoking status: Former Smoker    Packs/day: 1.00    Years: 47.00    Pack years: 47.00    Types: Cigarettes  . Smokeless tobacco: Never Used  Substance Use Topics  . Alcohol use: No  . Drug use: No     Allergies   Varenicline   Review of Systems Review of Systems  Constitutional: Negative for chills and fever.  HENT: Negative for ear pain and sore throat.   Eyes: Negative for pain and visual disturbance.  Respiratory: Negative for cough and shortness of breath.   Cardiovascular: Negative for chest pain and palpitations.  Gastrointestinal: Negative for abdominal pain and vomiting.  Genitourinary: Negative for dysuria and hematuria.  Musculoskeletal: Negative for arthralgias and  back pain.  Skin: Positive for color change and wound. Negative for rash.  Neurological: Negative for seizures, syncope and headaches.  All other systems reviewed and are negative.    Physical Exam Updated Vital Signs  ED Triage Vitals  Enc Vitals Group     BP 04/18/18 1129 101/71     Pulse Rate 04/18/18 1129 79     Resp 04/18/18 1129 18     Temp 04/18/18 1129 98.6 F (37 C)     Temp Source 04/18/18 1129 Oral     SpO2 04/18/18 1129 96 %     Weight 04/18/18 1133 180 lb (81.6 kg)     Height 04/18/18 1133  (1.854 m)     Head Circumference --      Peak Flow --      Pain Score 04/18/18 1130 0     Pain Loc --      Pain Edu? --      Excl. in GC? --     Physical Exam Vitals signs and nursing note reviewed.  Constitutional:      Appearance: He is well-developed.  HENT:     Head: Normocephalic and atraumatic.     Nose: Nose normal.     Mouth/Throat:     Mouth: Mucous membranes are moist.  Eyes:     Extraocular Movements: Extraocular movements intact.     Conjunctiva/sclera: Conjunctivae normal.     Pupils: Pupils  are equal, round, and reactive to light.  Neck:     Musculoskeletal: Neck supple.  Cardiovascular:     Rate and Rhythm: Normal rate and regular rhythm.     Heart sounds: Normal heart sounds. No murmur.     Comments: Doppler pulse in left DP Pulmonary:     Effort: Pulmonary effort is normal. No respiratory distress.     Breath sounds: Normal breath sounds.  Abdominal:     General: Abdomen is flat.     Palpations: Abdomen is soft.     Tenderness: There is no abdominal tenderness.  Musculoskeletal: Normal range of motion.        General: Tenderness present.  Skin:    General: Skin is warm and dry.     Comments: Left foot cellulitis, redness and swelling to the left fourth metatarsal extending over the forefoot and up to the ankle, no swelling of the joint spaces of the left lower extremity, mild ulceration to the tip of the left fourth metatarsal  Neurological:     General: No focal deficit present.     Mental Status: He is alert and oriented to person, place, and time.     Sensory: No sensory deficit.     Motor: No weakness.  Psychiatric:        Mood and Affect: Mood normal.      ED Treatments / Results  Labs (all labs ordered are listed, but only abnormal results are displayed) Labs Reviewed  SEDIMENTATION RATE - Abnormal; Notable for the following components:      Result Value   Sed Rate 46 (*)    All other components within normal limits  COMPREHENSIVE METABOLIC PANEL - Abnormal; Notable for the following components:   Sodium 133 (*)    Chloride 97 (*)    Glucose, Bld 158 (*)    Calcium 8.8 (*)    Albumin 3.3 (*)    All other components within normal limits  CBG MONITORING, ED - Abnormal; Notable for the following components:   Glucose-Capillary 140 (*)  All other components within normal limits  CULTURE, BLOOD (ROUTINE X 2)  CULTURE, BLOOD (ROUTINE X 2)  LACTIC ACID, PLASMA  CBC WITH DIFFERENTIAL/PLATELET  URINALYSIS, ROUTINE W REFLEX MICROSCOPIC  C-REACTIVE  PROTEIN  HIV ANTIBODY (ROUTINE TESTING W REFLEX)  CBC  COMPREHENSIVE METABOLIC PANEL  HEMOGLOBIN A1C    EKG None  Radiology Dg Foot Complete Left  Result Date: 04/18/2018 CLINICAL DATA:  Sore on fourth toe.  Redness and swelling. EXAM: LEFT FOOT - COMPLETE 3+ VIEW COMPARISON:  None. FINDINGS: There is an ulceration on the fourth toe. There is associated swelling of the fourth toe. The distal phalanx of the fourth toe is not well assessed. There does appear to be some mild irregularity underlying the ulceration. No other acute abnormalities on today's study. There is a large plantar spur. Degenerative changes are seen in the midfoot. Soft tissue swelling over the top of the foot. IMPRESSION: 1. There appears to be an ulceration in the soft tissues of the distal fourth toe. The foci of air may be in the ulceration or in the adjacent soft tissues. There is irregularity of the underlying coughed which is incompletely evaluated on today's study. Findings are concerning for osteomyelitis. An MRI could further assess. Electronically Signed   By: Gerome Sam III M.D   On: 04/18/2018 14:14    Procedures Procedures (including critical care time)  Medications Ordered in ED Medications  cefTRIAXone (ROCEPHIN) 2 g in sodium chloride 0.9 % 100 mL IVPB ( Intravenous Stopped 04/18/18 1252)  metroNIDAZOLE (FLAGYL) IVPB 500 mg ( Intravenous Stopped 04/18/18 1321)  enoxaparin (LOVENOX) injection 30 mg (has no administration in time range)  acetaminophen (TYLENOL) tablet 650 mg (has no administration in time range)    Or  acetaminophen (TYLENOL) suppository 650 mg (has no administration in time range)  atorvastatin (LIPITOR) tablet 80 mg (has no administration in time range)  hydrochlorothiazide (MICROZIDE) capsule 12.5 mg (has no administration in time range)  leflunomide (ARAVA) tablet 20 mg (has no administration in time range)  morphine (MS CONTIN) 12 hr tablet 100 mg (has no administration in time  range)  predniSONE (DELTASONE) tablet 2 mg (has no administration in time range)  pregabalin (LYRICA) capsule 75 mg (has no administration in time range)  vitamin B-12 (CYANOCOBALAMIN) tablet 100 mcg (has no administration in time range)  Insulin Glargine (1 Unit Dial) SOPN 75 Units (has no administration in time range)  pregabalin (LYRICA) capsule 150 mg (has no administration in time range)  insulin aspart (novoLOG) injection 0-20 Units (has no administration in time range)  insulin aspart (novoLOG) injection 0-5 Units (has no administration in time range)  insulin aspart (novoLOG) injection 6 Units (has no administration in time range)  lactated ringers 1,000 mL with potassium chloride 10 mEq infusion (has no administration in time range)  vancomycin (VANCOCIN) 1,250 mg in sodium chloride 0.9 % 250 mL IVPB (has no administration in time range)  vancomycin (VANCOCIN) 1,500 mg in sodium chloride 0.9 % 500 mL IVPB (0 mg Intravenous Stopped 04/18/18 1549)     Initial Impression / Assessment and Plan / ED Course  I have reviewed the triage vital signs and the nursing notes.  Pertinent labs & imaging results that were available during my care of the patient were reviewed by me and considered in my medical decision making (see chart for details).     Brian Zavala is a 68 year old male with history of COPD, high cholesterol, hypertension, diabetes, peripheral vascular disease status post  right BKA who presents to the ED with left foot infection.  Patient with normal vitals.  No fever.  Patient with redness and swelling to the left fourth digit and left foot that he noticed mostly today.  He appears to have cellulitis versus osteomyelitis on exam.  Empirically given IV vancomycin, IV Flagyl, IV Rocephin.  Patient had no significant leukocytosis or lactic acidosis.  Patient is not septic.  However x-ray of the left foot does show signs concerning for osteomyelitis.  Patient hemodynamically stable  throughout my care.  Lab work overall unremarkable.  Patient admitted to medicine service for further care.  Will get MRI to further evaluate for osteomyelitis.  Wound was outlined and will continue to treat left foot cellulitis.  Patient is high risk for losing this limb.  He did have Doppler pulses in his left foot.  This chart was dictated using voice recognition software.  Despite best efforts to proofread,  errors can occur which can change the documentation meaning.    Final Clinical Impressions(s) / ED Diagnoses   Final diagnoses:  Cellulitis of left foot    ED Discharge Orders    None       Virgina Norfolk, DO 04/18/18 1706

## 2018-04-18 NOTE — ED Triage Notes (Signed)
Pt. Coming from home complaining of a sore on the 4th toe of his left foot. Redness and swelling on left foot. Pt. Is unsure when the redness and swelling began.

## 2018-04-18 NOTE — Progress Notes (Signed)
Pharmacy Antibiotic Note  Brian Zavala is a 68 y.o. male admitted on 04/18/2018 with diabetic foot infection.  Pharmacy has been consulted for vanc dosing.  Presented with left foot redness and swelling. Hx of osteo with BKA in 2006. Recent abx for ulcer on stump. He was followed by Dr. Orvan Falconer with ID. Xray showed possible osteo. Pt has got one dose of vanc in the ED.  Scr <1, wt 82kg  Plan: Vanc 1.25g IV q12 AUC 478, scr 0.82 Level as needed  Height: 6\' 1"  (185.4 cm) Weight: 180 lb (81.6 kg) IBW/kg (Calculated) : 79.9  Temp (24hrs), Avg:98.6 F (37 C), Min:98.6 F (37 C), Max:98.6 F (37 C)  Recent Labs  Lab 04/18/18 1140 04/18/18 1143  WBC 10.0  --   CREATININE 0.82  --   LATICACIDVEN  --  1.8    Estimated Creatinine Clearance: 98.8 mL/min (by C-G formula based on SCr of 0.82 mg/dL).    Allergies  Allergen Reactions  . Varenicline Other (See Comments)    Upset stomach    Antimicrobials this admission: 2/15 vanc>> 2/15 ceftriaxone>> 2/15 flagyl>>  Dose adjustments this admission:   Microbiology results: 2/15 blood>>  Ulyses Southward, PharmD, [place], AAHIVP, CPP Infectious Disease Pharmacist 04/18/2018 4:51 PM

## 2018-04-18 NOTE — Plan of Care (Signed)
  Problem: Pain Managment: Goal: General experience of comfort will improve Outcome: Progressing   Problem: Safety: Goal: Ability to remain free from injury will improve Outcome: Progressing   

## 2018-04-18 NOTE — H&P (Signed)
Date: 04/18/2018               Patient Name:  ARLISS FRISINA MRN: [id number]  DOB: [birth year] Age / Sex: 68 y.o., male   [facility]         Medical Service: Internal Medicine Teaching Service         Attending Physician: Dr. Rebeca Alert, Raynaldo Opitz, MD    First Contact: Dr. Myrtie Hawk Pager: [phone]  Second Contact: Dr. Shan Levans Pager: [phone]       After Hours (After 5p/  First Contact Pager: [phone]  weekends / holidays): Second Contact Pager: [phone]   Chief Complaint: left foot redness and swelling   History of Present Illness: Mr. 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 He also noticed a black spot at the tip of his fourth toe that was draining bloody pus earlier today. He states that before this morning, his foot was fine (although he did not remember the last time he had looked at it). He has no sensation in the left leg below the knee. He denies any pain. He denies fevers, chills, chest pain, dyspnea. He was recently treated with antibiotics for a sore on his right BKA stump. He has been off antibiotics for over a month and he reports that the area is healing very well. He was followed by Dr. Megan Salon of ID for this problem. He is also followed by Dr. Gwenlyn Found of cardiology for his PVD.  Upon arrival to the ED, the patient was afebrile and hemodynamically stable. No leukocytosis. Lactate negative. ESR elevated to 46. Glucose elevated to 158. Left foot xray showed ulceration of the distal fourth toe and findings concerning for osteomyelitis. He was started on ceftriaxone, vancomycin, and  metronidazole.   Meds:  Current Meds  Medication Sig  . aspirin 81 MG chewable tablet Chew 81 mg by mouth daily.  [place] atorvastatin (LIPITOR) 80 MG tablet Take 1 tablet (80 mg total) by mouth daily at 6 PM.  . Calcium Carbonate-Vitamin D (CALCIUM + D PO) Take 1 tablet by mouth daily.   . cholecalciferol (VITAMIN D) 1000 UNITS tablet Take 1,000 Units by mouth daily.  . clopidogrel (PLAVIX) 75 MG tablet Take 1 tablet (75 mg total) by mouth daily with breakfast.  . docusate sodium (COLACE) 100 MG capsule Take 100 mg by mouth 2 (two) times daily.  Marland Kitchen etanercept (ENBREL) 50 MG/ML injection Inject 50 mg into the skin every 7 (seven) days. Wednesdays  . furosemide (LASIX) 20 MG tablet Take 20 mg by mouth daily as needed for fluid.   . hydrochlorothiazide (MICROZIDE) 12.5 MG capsule Take 12.5 mg by mouth daily.  . Insulin Glargine (TOUJEO SOLOSTAR [place]) Inject 135 Units into the skin every evening.   . leflunomide (ARAVA) 20 MG tablet Take 20 mg by mouth daily.  . Liraglutide (VICTOZA) 18 MG/3ML SOLN injection Inject 1.8 mg into the skin every evening.   . metFORMIN (GLUCOPHAGE) 1000 MG tablet Take 1,000 mg by mouth 2 (two) times daily.  Marland Kitchen morphine (MS CONTIN) 100 MG 12 hr tablet Take 100 mg by mouth every  12 (twelve) hours.  . predniSONE (DELTASONE) 1 MG tablet Take 2 mg by mouth daily with breakfast.  . pregabalin (LYRICA) 75 MG capsule Take 75-150 mg by mouth 2 (two) times daily. Take 75 mg in the morning and 150 mg in the evening  . Skin Protectants, Misc. (EUCERIN) cream Apply 1 application topically 2 (two) times daily.  . vitamin B-12 (CYANOCOBALAMIN) 100 MCG tablet Take 100 mcg by mouth daily.     Allergies: Allergies as of 04/18/2018 - Review Complete 04/18/2018  Allergen Reaction Noted  . Varenicline Other (See Comments) 08/21/2015   Past Medical History:  Diagnosis Date  . Arthritis    "pretty much all over"  . COPD (chronic obstructive pulmonary disease) ([place])   . Hyperlipidemia     . Hypertension   . Neuromuscular disorder ([place])   . Neuropathy   . Type II diabetes mellitus ([place])   . Wears glasses   . Wears partial dentures    top and bottom partials   Past Surgical History:  Procedure Laterality Date  . BACK SURGERY    . CHOLECYSTECTOMY N/A 10/26/2013   Procedure: LAPAROSCOPIC CHOLECYSTECTOMY WITH INTRAOPERATIVE CHOLANGIOGRAM;  Surgeon: Joyice Faster. Cornett, MD;  Location: [facility];  Service: General;  Laterality: N/A;  . COLONOSCOPY    . CYST EXCISION Bilateral 02/15/2015   Procedure: LEFT INDEX FINGER AND RIGHT MIDDLE FINGER NODULE EXCISION;  Surgeon: Leandrew Koyanagi, MD;  Location: [facility];  Service: Orthopedics;  Laterality: Bilateral;  . CYST EXCISION PERINEAL N/A 12/08/2012   Procedure: CYST EXCISION [place]IN[place];  Surgeon: Marcello Moores A. Cornett, MD;  Location: [facility];  Service: General;  Laterality: N/A;  . FOOT AMPUTATION Right 2005  . I&D EXTREMITY Right 03/07/2017   Procedure: EXCISION FIBULAR HEAD RIGHT BELOW KNEE AMPUTATION;  Surgeon: Newt Minion, MD;  Location: [facility];  Service: Orthopedics;  Laterality: Right;  . INCISE AND DRAIN ABCESS  12/02/2014   PERINEAL ABSCESS  . INCISION AND DRAINAGE PERIRECTAL ABSCESS Left 12/02/2014   Procedure: IRRIGATION AND DEBRIDEMENT PERINEAL ABSCESS;  Surgeon: Coralie Keens, MD;  Location: [facility];  Service: General;  Laterality: Left;  . LEG AMPUTATION BELOW KNEE Right 2005  . LOWER EXTREMITY ANGIOGRAPHY N/A 05/05/2017   Procedure: LOWER EXTREMITY ANGIOGRAPHY;  Surgeon: Lorretta Harp, MD;  Location: [facility];  Service: Cardiovascular;  Laterality: N/A;  . LUMBAR DISC SURGERY  82,90   ruptured disc  . PERIPHERAL VASCULAR INTERVENTION Right 05/05/2017   Procedure: PERIPHERAL VASCULAR INTERVENTION;  Surgeon: Lorretta Harp, MD;  Location: [facility];  Service: Cardiovascular;  Laterality: Right;  . SCAR REVISION Right 2005   @ amputation  . TOE AMPUTATION Right 2005     Family History:  Family History  Problem Relation Age of Onset  . Cirrhosis Father    Social History: Lives alone. Uses a wheelchair. Has a sister nearby who is a Marine scientist and helps him with his medical visits. Quit smoking six months ago. Previously had a 75 pack year tobacco history. Denies etoh. Denies illicit drugs.   Review of Systems: A complete ROS was negative except as per HPI.   Physical Exam: Blood pressure 106/80, pulse 89, temperature 98.6 F (37 C), temperature source Oral, resp. rate (!) 25, height '6\' 1"'  (1.854 m), weight 81.6 kg, SpO2 96 %.  Constitutional: Well-developed, well-nourished, and in no distress.  Eyes: Pupils are equal, round, and reactive to light. EOM are normal.  Cardiovascular: Normal rate and  regular rhythm. No murmurs, rubs, or gallops. Pulmonary/Chest: Effort normal. Clear to auscultation bilaterally. No wheezes, rales, or rhonchi. Abdominal: Bowel sounds present. Soft, non-distended, non-tender. Ext: Right BKA. There is a well-healed lesion on the lateral BKA stump without erythema or drainage. On the left fourth toe there is an ulcer that is not actively draining. There is erythema, edema, and increased warmth of the foot that tracks up the shin. No ttp.  Skin: Warm and dry.   Assessment & Plan by Problem: Active Problems:   Diabetic foot infection [facility])  Mr. 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   Diabetic left foot infection - Ulcer on the left 4th toe. Erythema, edema, and increased warmth of the left foot and left shin.  - No signs of systemic symptoms. No fever. No leukocytosis. Blood cultures drawn.  - Xray could not rule out  osteomyelitis Plan - MRI left foot. Will consult ortho if OM is present.  - F/u blood cultures - Continue Ceftriaxone, Vancomycin, and Metronidazole  - Trend CRP - NPO at midnight   PVD - Patient already took aspirin and plavix this morning Plan - Hold aspirin and plavix tomorrow if going for surgery   DMII - Last A1c 7.4 one year ago. Current home regimen includes insulin glargine 135u qhs, victoza 1.2m daily, and metformin  Plan - A1c - 75u long acting insulin qhs - Novolog 6u TID with meals - Resistant SSI - Continue home lyrica 755mqam and 15066mpm  HTN - Continue home hctz 12.5mg25mily  HLD - Continue home lipitor  RA - Continue home leflunomide, prednisone 2mg 69mly - Continue home MS contin 100mg 32mrs - He takes enbrel weekly on Wednesdays.  FEN: no IVF, carb modified diet, NPO at midnight, replace electrolytes as needed  DVT ppx: [place] Lovenox Code status: FULL code  Dispo: Admit patient to Inpatient with expected length of stay greater than 2 midnights.  Signed: DorrelCorinne Ports/15/2020, 4:04 PM  Pager: [phone]

## 2018-04-18 NOTE — Progress Notes (Signed)
Hypoglycemic Event  CBG: 66  Treatment: 4 oz juice/soda  Symptoms: Pale, sweaty  Follow-up CBG: Time:2120 CBG Result:74  Possible Reasons for Event: Inadequate meal intake  Comments/MD notified:yes    Gilles Chiquito

## 2018-04-18 NOTE — ED Notes (Signed)
Patient transported to X-ray 

## 2018-04-19 ENCOUNTER — Inpatient Hospital Stay (HOSPITAL_COMMUNITY): Payer: Medicare (Managed Care)

## 2018-04-19 DIAGNOSIS — M869 Osteomyelitis, unspecified: Secondary | ICD-10-CM

## 2018-04-19 DIAGNOSIS — M868X7 Other osteomyelitis, ankle and foot: Secondary | ICD-10-CM

## 2018-04-19 DIAGNOSIS — I739 Peripheral vascular disease, unspecified: Secondary | ICD-10-CM | POA: Diagnosis present

## 2018-04-19 DIAGNOSIS — I96 Gangrene, not elsewhere classified: Secondary | ICD-10-CM

## 2018-04-19 HISTORY — DX: Osteomyelitis, unspecified: M86.9

## 2018-04-19 LAB — COMPREHENSIVE METABOLIC PANEL
ALT: 18 U/L (ref 0–44)
AST: 18 U/L (ref 15–41)
Albumin: 3 g/dL — ABNORMAL LOW (ref 3.5–5.0)
Alkaline Phosphatase: 51 U/L (ref 38–126)
Anion gap: 9 (ref 5–15)
BUN: 7 mg/dL — ABNORMAL LOW (ref 8–23)
CO2: 28 mmol/L (ref 22–32)
Calcium: 8.4 mg/dL — ABNORMAL LOW (ref 8.9–10.3)
Chloride: 103 mmol/L (ref 98–111)
Creatinine, Ser: 0.66 mg/dL (ref 0.61–1.24)
GFR calc Af Amer: >60 mL/min (ref >60)
GFR calc non Af Amer: >60 mL/min (ref >60)
Glucose, Bld: 77 mg/dL (ref 70–99)
Potassium: 3.3 mmol/L — ABNORMAL LOW (ref 3.5–5.1)
Sodium: 140 mmol/L (ref 135–145)
Total Bilirubin: 0.6 mg/dL (ref 0.3–1.2)
Total Protein: 7 g/dL (ref 6.5–8.1)

## 2018-04-19 LAB — CBC
HCT: 41 % (ref 39.0–52.0)
Hemoglobin: 13.2 g/dL (ref 13.0–17.0)
MCH: 26.8 pg (ref 26.0–34.0)
MCHC: 32.2 g/dL (ref 30.0–36.0)
MCV: 83.2 fL (ref 80.0–100.0)
Platelets: 221 10*3/uL (ref 150–400)
RBC: 4.93 MIL/uL (ref 4.22–5.81)
RDW: 15.4 % (ref 11.5–15.5)
WBC: 7.8 10*3/uL (ref 4.0–10.5)
nRBC: 0 % (ref 0.0–0.2)

## 2018-04-19 LAB — GLUCOSE, CAPILLARY
Glucose-Capillary: 53 mg/dL — ABNORMAL LOW (ref 70–99)
Glucose-Capillary: 256 mg/dL — ABNORMAL HIGH (ref 70–99)
Glucose-Capillary: 162 mg/dL — ABNORMAL HIGH (ref 70–99)
Glucose-Capillary: 246 mg/dL — ABNORMAL HIGH (ref 70–99)
Glucose-Capillary: 182 mg/dL — ABNORMAL HIGH (ref 70–99)
Glucose-Capillary: 177 mg/dL — ABNORMAL HIGH (ref 70–99)

## 2018-04-19 LAB — HIV ANTIBODY (ROUTINE TESTING W REFLEX): HIV Screen 4th Generation wRfx: NONREACTIVE

## 2018-04-19 MED ORDER — INSULIN GLARGINE 100 UNIT/ML ~~LOC~~ SOLN: 10.0000 [IU] | Freq: Every day | SUBCUTANEOUS | Status: DC

## 2018-04-19 MED ORDER — DEXTROSE 50 % IV SOLN
INTRAVENOUS | Status: AC
  Filled 2018-04-19: qty 50

## 2018-04-19 MED ORDER — DEXTROSE 50 % IV SOLN
25.0000 g | INTRAVENOUS | Status: AC
  Administered 2018-04-19: 25 g via INTRAVENOUS

## 2018-04-19 MED ORDER — OXYCODONE HCL 5 MG PO TABS
10.0000 mg | ORAL_TABLET | Freq: Four times a day (QID) | ORAL | Status: AC | PRN
  Administered 2018-04-19 – 2018-04-20 (×3): 10 mg via ORAL
  Filled 2018-04-19 (×3): qty 2

## 2018-04-19 MED ORDER — INSULIN ASPART 100 UNIT/ML ~~LOC~~ SOLN
3.0000 [IU] | Freq: Three times a day (TID) | SUBCUTANEOUS | Status: DC
  Administered 2018-04-19: 3 [IU] via SUBCUTANEOUS

## 2018-04-19 MED ORDER — OXYMETAZOLINE HCL 0.05 % NA SOLN
1.0000 | Freq: Two times a day (BID) | NASAL | Status: AC | PRN
  Administered 2018-04-19 (×2): 1 via NASAL
  Filled 2018-04-19: qty 30

## 2018-04-19 MED ORDER — INSULIN GLARGINE 100 UNIT/ML ~~LOC~~ SOLN
16.0000 [IU] | Freq: Every day | SUBCUTANEOUS | Status: DC
  Administered 2018-04-19 – 2018-04-20 (×2): 16 [IU] via SUBCUTANEOUS
  Filled 2018-04-19 (×3): qty 0.16

## 2018-04-19 MED ORDER — INSULIN ASPART 100 UNIT/ML ~~LOC~~ SOLN
6.0000 [IU] | Freq: Three times a day (TID) | SUBCUTANEOUS | Status: DC
  Administered 2018-04-19 – 2018-04-23 (×9): 6 [IU] via SUBCUTANEOUS

## 2018-04-19 MED ORDER — VANCOMYCIN HCL 10 G IV SOLR
1500.0000 mg | Freq: Two times a day (BID) | INTRAVENOUS | Status: DC
  Administered 2018-04-19 – 2018-04-23 (×7): 1500 mg via INTRAVENOUS
  Filled 2018-04-19 (×10): qty 1500

## 2018-04-19 NOTE — Progress Notes (Signed)
     Subjective:68 year old male with diabetes mellitus type 2 with history of right BKA for peripheral vascular disease by Dr. Lajoyce Corners one year ago. Last seen by Dr. Lajoyce Corners about a year ago for follow up of the right BKA. Now presents with left foot 4th toe gangrene changes. Apparently change in the left foot over the past weeks.      Patient reports pain as moderate.    Objective:   VITALS:  Temp:  [98.4 F (36.9 C)-98.9 F (37.2 C)] 98.4 F (36.9 C) (02/16 0610) Pulse Rate:  [78-104] 81 (02/16 0610) Resp:  [7-26] 20 (02/16 0610) BP: (94-160)/(58-93) 160/65 (02/16 0610) SpO2:  [92 %-99 %] 95 % (02/16 0610) Weight:  [80.9 kg-81.6 kg] 80.9 kg (02/15 1728)  ABD soft Dorsiflexion/Plantar flexion intact Compartment soft cellulitis of the dorsal left foot and medial left leg with erythrema, no crepitus. Left fourth toe with swelling skin with shiney appearance and dark consistent with gangrene.   LABS Recent Labs    04/18/18 1140 04/19/18 0248  HGB 13.0 13.2  WBC 10.0 7.8  PLT 242 221   Recent Labs    04/18/18 1140 04/19/18 0248  NA 133* 140  K 3.6 3.3*  CL 97* 103  CO2 26 28  BUN 8 7*  CREATININE 0.82 0.66  GLUCOSE 158* 77   No results for input(s): LABPT, INR in the last 72 hours.   Assessment/Plan:Left 4th toe gangrene, wedge appearance without ulcer, suspicious for peripheral arterial occlusion. Recommend repeat ABIs     I will inform Dr. Lajoyce Corners of this patient's admission. IV antibiotics broad spectrum, vanco and zosyn recommended. Will need repeat doppler studies of the left leg to assess for acute changes as the appearance is that seen with an acute thrombosis of a peripheral artery.   Vira Browns 04/19/2018, 10:09 AMPatient ID: Brian Zavala, male   DOB: [birth year], 68 y.o.   MRN: [id number]

## 2018-04-19 NOTE — Progress Notes (Signed)
   Subjective: Pt feels well today, he notices the erythema has improved with abx.  We discussed his MRI findings and consulting orthopedic surgery.    Objective:  Vital signs in last 24 hours: Vitals:   04/18/18 1827 04/18/18 2105 04/19/18 0535 04/19/18 0610  BP: (!) 141/69 (!) 155/93 101/67 (!) 160/65  Pulse: 88 79 (!) 104 81  Resp: 18 16 18 20   Temp: 98.9 F (37.2 C) 98.5 F (36.9 C) 98.5 F (36.9 C) 98.4 F (36.9 C)  TempSrc: Oral Oral Oral Oral  SpO2: 95% 95% 98% 95%  Weight:      Height:       Physical Exam  Constitutional: Appears well-developed and well-nourished. No distress.  HENT:  Head: Normocephalic and atraumatic.  Eyes: Conjunctivae are normal.  Cardiovascular: Normal rate, regular rhythm and normal heart sounds.  Respiratory: Effort normal and breath sounds normal. No respiratory distress. No wheezes.  GI: Soft. Bowel sounds are normal. No distension. There is no tenderness.  Musculoskeletal: Right BKA, left foot swelling mostly on 4th toe, with redness (improved comparing to yesterday), w/o tenderness. Pulses are palpable. Neurological: Is alert and oriented x3.  Skin: Not diaphoretic. No erythema.  Psychiatric: Normal mood and affect. Behavior is normal. Judgment and thought content normal.    Assessment/Plan:  Active Problems:   Diabetic foot infection (HCC)  Osteomyelitis of left 4th toe Erythema improving with abx  - No signs of systemic symptoms. No fever. No leukocytosis. Blood cultures drawn.  - Xray could not rule out osteomyelitis Plan - F/u blood cultures - Continue Ceftriaxone, Vancomycin, and Metronidazole  - Trend CRP -Ortho consulted--> Ordered doppler to r/u thrombosis - Po diet for today but will make NPO after midnight  -Will follow ortho. We appreciate recommendation  PVD - Patient already took aspirin and plavix this morning Plan - Will hold aspirin and plavix tomorrow if any plan for surgery   DMII - Last A1c 7.4 one  year ago. Current home regimen includes insulin glargine 135u qhs, victoza 1.8mg  daily, and metformin.  Had episode of hypoglycemia  last night and today that improved with Juice and 1 glucose ampule.  Current CBG is 240  Plan -Monitor CBG - 75u long acting insulin qhs - Novolog 6u TID with meals - Resistant SSI - Continue home lyrica 75mg  qam and 150mg  qpm - f/u HbA1c  HTN - Continue home hctz 12.5mg  daily  HLD - Continue home lipitor  RA - Continue home leflunomide, prednisone 2mg  daily - Continue home MS contin 100mg  q12hrs - He takes enbrel weekly on Wednesdays.  Dispo: Anticipated discharge depend on ortho plan.   Angelita Ingles, MD 04/19/2018, 8:12 AM

## 2018-04-19 NOTE — Progress Notes (Signed)
Left ABI/TBI completed. Please see preliminary notes on CV PROC under chart review. Selina H Cole(RDMS RVT) 04/19/18 3:43 PM

## 2018-04-19 NOTE — Progress Notes (Signed)
  Date: 04/19/2018  Patient name: Brian Zavala  Medical record number: [id number]  Date of birth: 11/06/1950   I have seen and evaluated this patient and I have discussed the plan of care with the house staff. Please see their note for complete details. I concur with their findings with the following additions/corrections:   Please see my separate attestation of the H&P from 04/18/2018.  Jessy Oto, M.D., Ph.D. 04/19/2018, 1:44 PM

## 2018-04-19 NOTE — Progress Notes (Signed)
Hypoglycemic Event  CBG: 53  Treatment: 8 oz juice/soda  Symptoms: Sweaty  Follow-up CBG: Time:0650 CBG Result:256  Possible Reasons for Event: Inadequate meal intake  Comments/MD notified:yes 25g Dextrose IV ordered    Gilles Chiquito

## 2018-04-20 DIAGNOSIS — M069 Rheumatoid arthritis, unspecified: Secondary | ICD-10-CM

## 2018-04-20 DIAGNOSIS — Z794 Long term (current) use of insulin: Secondary | ICD-10-CM

## 2018-04-20 DIAGNOSIS — E119 Type 2 diabetes mellitus without complications: Secondary | ICD-10-CM

## 2018-04-20 DIAGNOSIS — I10 Essential (primary) hypertension: Secondary | ICD-10-CM

## 2018-04-20 DIAGNOSIS — E1169 Type 2 diabetes mellitus with other specified complication: Principal | ICD-10-CM

## 2018-04-20 DIAGNOSIS — E785 Hyperlipidemia, unspecified: Secondary | ICD-10-CM

## 2018-04-20 DIAGNOSIS — I739 Peripheral vascular disease, unspecified: Secondary | ICD-10-CM

## 2018-04-20 DIAGNOSIS — Z89511 Acquired absence of right leg below knee: Secondary | ICD-10-CM

## 2018-04-20 DIAGNOSIS — L03116 Cellulitis of left lower limb: Secondary | ICD-10-CM

## 2018-04-20 DIAGNOSIS — M869 Osteomyelitis, unspecified: Secondary | ICD-10-CM

## 2018-04-20 LAB — GLUCOSE, CAPILLARY
Glucose-Capillary: 115 mg/dL — ABNORMAL HIGH (ref 70–99)
Glucose-Capillary: 301 mg/dL — ABNORMAL HIGH (ref 70–99)
Glucose-Capillary: 219 mg/dL — ABNORMAL HIGH (ref 70–99)
Glucose-Capillary: 82 mg/dL (ref 70–99)

## 2018-04-20 LAB — CBC
HCT: 43 % (ref 39.0–52.0)
Hemoglobin: 13.8 g/dL (ref 13.0–17.0)
MCH: 26.8 pg (ref 26.0–34.0)
MCHC: 32.1 g/dL (ref 30.0–36.0)
MCV: 83.5 fL (ref 80.0–100.0)
Platelets: 253 10*3/uL (ref 150–400)
RBC: 5.15 MIL/uL (ref 4.22–5.81)
RDW: 15.4 % (ref 11.5–15.5)
WBC: 6 10*3/uL (ref 4.0–10.5)
nRBC: 0 % (ref 0.0–0.2)

## 2018-04-20 LAB — BASIC METABOLIC PANEL
Anion gap: 10 (ref 5–15)
BUN: <5 mg/dL — ABNORMAL LOW (ref 8–23)
CO2: 28 mmol/L (ref 22–32)
Calcium: 8.4 mg/dL — ABNORMAL LOW (ref 8.9–10.3)
Chloride: 102 mmol/L (ref 98–111)
Creatinine, Ser: 0.8 mg/dL (ref 0.61–1.24)
GFR calc Af Amer: >60 mL/min (ref >60)
GFR calc non Af Amer: >60 mL/min (ref >60)
Glucose, Bld: 117 mg/dL — ABNORMAL HIGH (ref 70–99)
Potassium: 3.4 mmol/L — ABNORMAL LOW (ref 3.5–5.1)
Sodium: 140 mmol/L (ref 135–145)

## 2018-04-20 MED ORDER — ASPIRIN EC 81 MG PO TBEC
81.0000 mg | DELAYED_RELEASE_TABLET | Freq: Every day | ORAL | Status: DC
  Administered 2018-04-20 – 2018-04-23 (×3): 81 mg via ORAL
  Filled 2018-04-20 (×4): qty 1

## 2018-04-20 MED ORDER — DOCUSATE SODIUM 100 MG PO CAPS
100.0000 mg | ORAL_CAPSULE | Freq: Two times a day (BID) | ORAL | Status: DC
  Administered 2018-04-20 – 2018-04-22 (×3): 100 mg via ORAL
  Filled 2018-04-20 (×7): qty 1

## 2018-04-20 MED ORDER — POTASSIUM CHLORIDE CRYS ER 20 MEQ PO TBCR
40.0000 meq | EXTENDED_RELEASE_TABLET | Freq: Two times a day (BID) | ORAL | Status: AC
  Administered 2018-04-20 (×2): 40 meq via ORAL
  Filled 2018-04-20 (×2): qty 2

## 2018-04-20 MED ORDER — RAMELTEON 8 MG PO TABS
8.0000 mg | ORAL_TABLET | Freq: Every day | ORAL | Status: DC
  Administered 2018-04-20 – 2018-04-22 (×3): 8 mg via ORAL
  Filled 2018-04-20 (×3): qty 1

## 2018-04-20 MED ORDER — SODIUM CHLORIDE 0.9 % IV SOLN
INTRAVENOUS | Status: DC | PRN
  Administered 2018-04-20: 250 mL via INTRAVENOUS

## 2018-04-20 MED ORDER — METRONIDAZOLE 500 MG PO TABS
500.0000 mg | ORAL_TABLET | Freq: Three times a day (TID) | ORAL | Status: DC
  Administered 2018-04-20 – 2018-04-23 (×8): 500 mg via ORAL
  Filled 2018-04-20 (×8): qty 1

## 2018-04-20 NOTE — Progress Notes (Signed)
Internal Medicine Attending:   I saw and examined the patient. I reviewed the resident's note and I agree with the resident's findings and plan as documented in the resident's note.  Patient feels well today with no new complaints.  Patient initially was admitted to the hospital with left fourth toe osteomyelitis.  We will continue with ceftriaxone, vancomycin and metronidazole for now.  Patient's erythema slowly improving.  Orthopedics follow-up and recommendations appreciated.  We will hold off on amputation for now given risk of impaired wound healing.  If infection persists despite 3 to 4 days of antibiotics Ortho to reconsider fourth ray amputation.  No further work-up at this time.  We will continue to monitor closely.

## 2018-04-20 NOTE — Progress Notes (Signed)
   Subjective: Brian Zavala is doing well. Denies any chest dizziness, pain, N/V. He was seen by Dr. Lajoyce Corners and understand and agree the plan.  Objective:  Vital signs in last 24 hours: Vitals:   04/19/18 1722 04/19/18 2126 04/20/18 0609 04/20/18 1402  BP:  (!) 179/90 (!) 156/96 (!) 155/86  Pulse:  82 83 81  Resp:  17 18 18   Temp: 98.5 F (36.9 C) 98.5 F (36.9 C) 98.1 F (36.7 C) 98.2 F (36.8 C)  TempSrc: Oral Oral Oral Oral  SpO2:  97% 97% 97%  Weight:      Height:      Physical Exam  Constitutional: Appears well-developed and well-nourished. No distress.  HENT:  Head: Normocephalic and atraumatic.  Eyes: Conjunctivae are normal.  Cardiovascular: Normal rate, regular rhythm and normal heart sounds.  Respiratory: Effort normal and breath sounds normal. No respiratory distress. No wheezes.  GI: Soft. Bowel sounds are normal. No distension. There is no tenderness.  Musculoskeletal: Right BKA, left foot swelling mostly on 4th toe, with redness (With some improvement), w/o tenderness. Pulses are palpable.   Neurological: Is alert.  Skin: Not diaphoretic. No erythema.  Psychiatric: Normal mood and affect. Behavior is normal. Judgment and thought content normal.     Assessment/Plan:  Principal Problem:   Osteomyelitis of fourth toe of left foot (HCC) Active Problems:   Diabetes mellitus, insulin dependent (IDDM), controlled (HCC)   Type 2 diabetes mellitus with diabetic neuropathy, with long-term current use of insulin (HCC)   Diabetic foot infection (HCC)   Peripheral vascular disease (HCC)   Cellulitis of left foot  Osteomyelitis of left 4th toe Is on Ceftriaxone, Vancomycin, and Metronidazole. Erythema improving with abx. No signs of systemic symptoms. No fever. No leukocytosis. No growth on blood cultures so far.  Doppler (ABI/TBI) performed to r/u peripheral arterial hrombosis--> Nl Dr. Lajoyce Corners saw patient today, mentioned that if cellulitis does not completely resolve  with IV antibiotics that a fourth ray amputation is an option.  Due to peripheral vascular disease there is about a 50% chance that the surgical incision would not heal.    We will continue IV antibiotic for next 3 4 days, and monitor clinical improvement.  If no improvement, and as mentioned above per orthopedic surgery amputation will be an option.  - Continue Ceftriaxone, Vancomycin, and Metronidazole  -Orthopedic surgery will follow the patient during his hospital stay, will follow their recommendation - Will put him on p.o. diet. -Resume aspirin - F/u blood cultures (No growth 1 day)  PVD - Will hold plavix but keep him on Plavix if any plan for surgery   DMII - Last A1c 7.4 one year ago. Current home regimen includes insulin glargine 135u qhs, victoza 1.8mg  daily, and metformin. With episode of hypoglycemia during admission. Current CBG between 100-170s -Monitor CBG  - Decreased Insulin Glargin from 75u to 16 unit  - Novolog 6u TID with meals - Resistant SSI - Continue home lyrica 75mg  qam and 150mg  qpm - f/u HbA1c  HTN: BP this AM 179/90. Repeat BP at 150s. Patient is asymptomatic. -Will monitor BP - Continue home hctz 12.5mg  daily  HLD - Continue home lipitor  RA - Continue home leflunomide, prednisone 2mg  daily - Continue home  Morphin (MS Contin) 100mg  q12hrs - He takes enbrel weekly on Wednesdays.  Dispo: Anticipated discharge in approximately 3-4 days  Chevis Pretty, MD 04/20/2018, 3:17 PM Pager: [phone]

## 2018-04-20 NOTE — Plan of Care (Signed)
  Problem: Education: Goal: Knowledge of General Education information will improve Description: Including pain rating scale, medication(s)/side effects and non-pharmacologic comfort measures Outcome: Progressing   Problem: Activity: Goal: Risk for activity intolerance will decrease Outcome: Progressing   Problem: Nutrition: Goal: Adequate nutrition will be maintained Outcome: Progressing   

## 2018-04-20 NOTE — Consult Note (Signed)
ORTHOPAEDIC CONSULTATION  REQUESTING PHYSICIAN: Anne Shutter, MD  Chief Complaint: Cellulitis left foot fourth toe.  HPI: Brian Zavala is a 68 y.o. male who presents with cellulitis of the left fourth toe with cellulitis extending dorsally on the foot up to the midcalf.  Patient is status post a right transtibial amputation.  He does have COPD and type 2 diabetes.  Past Medical History:  Diagnosis Date  . Arthritis    "pretty much all over"  . COPD (chronic obstructive pulmonary disease) (HCC)   . Hyperlipidemia   . Hypertension   . Neuromuscular disorder (HCC)   . Neuropathy   . Type II diabetes mellitus (HCC)   . Wears glasses   . Wears partial dentures    top and bottom partials   Past Surgical History:  Procedure Laterality Date  . BACK SURGERY    . CHOLECYSTECTOMY N/A 10/26/2013   Procedure: LAPAROSCOPIC CHOLECYSTECTOMY WITH INTRAOPERATIVE CHOLANGIOGRAM;  Surgeon: Clovis Pu. Cornett, MD;  Location: [facility] OR;  Service: General;  Laterality: N/A;  . COLONOSCOPY    . CYST EXCISION Bilateral 02/15/2015   Procedure: LEFT INDEX FINGER AND RIGHT MIDDLE FINGER NODULE EXCISION;  Surgeon: Tarry Kos, MD;  Location: [facility];  Service: Orthopedics;  Laterality: Bilateral;  . CYST EXCISION PERINEAL N/A 12/08/2012   Procedure: CYST EXCISION PERINeum;  Surgeon: Maisie Fus A. Cornett, MD;  Location: [facility];  Service: General;  Laterality: N/A;  . FOOT AMPUTATION Right 2005  . I&D EXTREMITY Right 03/07/2017   Procedure: EXCISION FIBULAR HEAD RIGHT BELOW KNEE AMPUTATION;  Surgeon: [name], MD;  Location: [facility] OR;  Service: Orthopedics;  Laterality: Right;  . INCISE AND DRAIN ABCESS  12/02/2014   PERINEAL ABSCESS  . INCISION AND DRAINAGE PERIRECTAL ABSCESS Left 12/02/2014   Procedure: IRRIGATION AND DEBRIDEMENT PERINEAL ABSCESS;  Surgeon: Abigail Miyamoto, MD;  Location: [facility] OR;  Service: General;  Laterality: Left;  . LEG AMPUTATION BELOW KNEE  Right 2005  . LOWER EXTREMITY ANGIOGRAPHY N/A 05/05/2017   Procedure: LOWER EXTREMITY ANGIOGRAPHY;  Surgeon: [name], MD;  Location: [facility] INVASIVE CV LAB;  Service: Cardiovascular;  Laterality: N/A;  . LUMBAR DISC SURGERY  82,90   ruptured disc  . PERIPHERAL VASCULAR INTERVENTION Right 05/05/2017   Procedure: PERIPHERAL VASCULAR INTERVENTION;  Surgeon: [name], MD;  Location: [facility] INVASIVE CV LAB;  Service: Cardiovascular;  Laterality: Right;  . SCAR REVISION Right 2005   @ amputation  . TOE AMPUTATION Right 2005   Social History   Socioeconomic History  . Marital status: Divorced    Spouse name: Not on file  . Number of children: Not on file  . Years of education: Not on file  . Highest education level: Not on file  Occupational History  . Not on file  Social Needs  . Financial resource strain: Not on file  . Food insecurity:    Worry: Not on file    Inability: Not on file  . Transportation needs:    Medical: Not on file    Non-medical: Not on file  Tobacco Use  . Smoking status: Former Smoker    Packs/day: 1.00    Years: 47.00    Pack years: 47.00    Types: Cigarettes  . Smokeless tobacco: Never Used  Substance and Sexual Activity  . Alcohol use: No  . Drug use: No  . Sexual activity: Never  Lifestyle  . Physical activity:    Days per week: Not  on file    Minutes per session: Not on file  . Stress: Not on file  Relationships  . Social connections:    Talks on phone: Not on file    Gets together: Not on file    Attends religious service: Not on file    Active member of club or organization: Not on file    Attends meetings of clubs or organizations: Not on file    Relationship status: Not on file  Other Topics Concern  . Not on file  Social History Narrative  . Not on file   Family History  Problem Relation Age of Onset  . Cirrhosis Father    - negative except otherwise stated in the family history section Allergies  Allergen Reactions  .  Varenicline Other (See Comments)    Upset stomach   Prior to Admission medications   Medication Sig Start Date End Date Taking? Authorizing Provider  aspirin 81 MG chewable tablet Chew 81 mg by mouth daily.   Yes [provider]  atorvastatin (LIPITOR) 80 MG tablet Take 1 tablet (80 mg total) by mouth daily at 6 PM. 05/06/17  Yes Laverda Page B, NP  Calcium Carbonate-Vitamin D (CALCIUM + D PO) Take 1 tablet by mouth daily.    Yes [provider]  cholecalciferol (VITAMIN D) 1000 UNITS tablet Take 1,000 Units by mouth daily.   Yes [provider]  clopidogrel (PLAVIX) 75 MG tablet Take 1 tablet (75 mg total) by mouth daily with breakfast. 05/07/17  Yes Laverda Page B, NP  docusate sodium (COLACE) 100 MG capsule Take 100 mg by mouth 2 (two) times daily.   Yes [provider]  etanercept (ENBREL) 50 MG/ML injection Inject 50 mg into the skin every 7 (seven) days. Wednesdays   Yes [provider]  furosemide (LASIX) 20 MG tablet Take 20 mg by mouth daily as needed for fluid.    Yes [provider]  hydrochlorothiazide (MICROZIDE) 12.5 MG capsule Take 12.5 mg by mouth daily.   Yes [provider]  Insulin Glargine (TOUJEO SOLOSTAR [place]) Inject 135 Units into the skin every evening.    Yes [provider]  leflunomide (ARAVA) 20 MG tablet Take 20 mg by mouth daily.   Yes [provider]  Liraglutide (VICTOZA) 18 MG/3ML SOLN injection Inject 1.8 mg into the skin every evening.    Yes [provider]  metFORMIN (GLUCOPHAGE) 1000 MG tablet Take 1,000 mg by mouth 2 (two) times daily.   Yes [provider]  morphine (MS CONTIN) 100 MG 12 hr tablet Take 100 mg by mouth every 12 (twelve) hours.   Yes [provider]  predniSONE (DELTASONE) 1 MG tablet Take 2 mg by mouth daily with breakfast.   Yes [provider]  pregabalin (LYRICA) 75 MG capsule Take 75-150 mg by mouth 2 (two) times daily.  Take 75 mg in the morning and 150 mg in the evening   Yes [provider]  Skin Protectants, Misc. (EUCERIN) cream Apply 1 application topically 2 (two) times daily.   Yes [provider]  vitamin B-12 (CYANOCOBALAMIN) 100 MCG tablet Take 100 mcg by mouth daily.   Yes [provider]   Mr Foot Left Wo Contrast  Result Date: 04/18/2018 CLINICAL DATA:  Left foot swelling and erythema with sore on the 4th toe. Diabetes. Osteomyelitis suspected. EXAM: MRI OF THE LEFT FOOT WITHOUT CONTRAST TECHNIQUE: Multiplanar, multisequence MR imaging of the left forefoot was performed. No intravenous  contrast was administered. COMPARISON:  Radiographs same date. FINDINGS: Bones/Joint/Cartilage There is soft tissue swelling distally in the 4th toe with ulceration along the lateral aspect of the nail bed. The distal 4th phalanx demonstrates abnormal marrow T2 hyperintensity. There is also mildly decreased T1 marrow signal, but no gross cortical destruction. The middle and proximal phalanges appear normal. There are no significant joint effusions. The additional toes appear normal. There are mild-to-moderate degenerative changes the 1st metatarsophalangeal joint. Mild degenerative changes are also present at the Lisfranc joint without associated subluxation. Ligaments The Lisfranc ligament is intact. Muscles and Tendons Diffuse fatty atrophy throughout the forefoot musculature. No significant tenosynovitis. Soft tissues Subcutaneous edema throughout the dorsum of the forefoot. No focal fluid collection. As above, swelling in the small toe with skin ulceration along the lateral aspect of the nail bed. IMPRESSION: 1. Marrow changes in the distal phalanx of the 4th toe are suspicious for early osteomyelitis given the adjacent soft tissue ulceration. No gross cortical destruction or joint effusion. 2. Soft tissue swelling and skin ulceration in the 4th toe without soft tissue abscess. Dorsal forefoot  subcutaneous edema. 3. No other acute osseous findings. Degenerative changes at the 1st metatarsophalangeal and Lisfranc joints. Electronically Signed   By: Carey Bullocks M.D.   On: 04/18/2018 17:15   Dg Foot Complete Left  Result Date: 04/18/2018 CLINICAL DATA:  Sore on fourth toe.  Redness and swelling. EXAM: LEFT FOOT - COMPLETE 3+ VIEW COMPARISON:  None. FINDINGS: There is an ulceration on the fourth toe. There is associated swelling of the fourth toe. The distal phalanx of the fourth toe is not well assessed. There does appear to be some mild irregularity underlying the ulceration. No other acute abnormalities on today's study. There is a large plantar spur. Degenerative changes are seen in the midfoot. Soft tissue swelling over the top of the foot. IMPRESSION: 1. There appears to be an ulceration in the soft tissues of the distal fourth toe. The foci of air may be in the ulceration or in the adjacent soft tissues. There is irregularity of the underlying coughed which is incompletely evaluated on today's study. Findings are concerning for osteomyelitis. An MRI could further assess. Electronically Signed   By: Gerome Sam III M.D   On: 04/18/2018 14:14   Vas [place] Abi With/wo Tbi  Result Date: 04/19/2018 LOWER EXTREMITY DOPPLER STUDY Indications: Gangrene, and Peripheral vascular disease. Gangrene on left              digits. High Risk Factors: Hypertension, Diabetes.  Vascular Interventions: Right peripheral vascular intervention surgery on                         05/05/2017. Limitations: Right BKA. Comparison Study: ABI/TBI on 05/01/2017. Performing Technologist: Melodie Bouillon RDMS, RVT  Examination Guidelines: A complete evaluation includes at minimum, Doppler waveform signals and systolic blood pressure reading at the level of bilateral brachial, anterior tibial, and posterior tibial arteries, when vessel segments are accessible. Bilateral testing is considered an integral part of a complete  examination. Photoelectric Plethysmograph (PPG) waveforms and toe systolic pressure readings are included as required and additional duplex testing as needed. Limited examinations for reoccurring indications may be performed as noted.  ABI Findings: +--------+------------------+-----+--------+--------+ Right   Rt Pressure (mmHg)IndexWaveformComment  +--------+------------------+-----+--------+--------+ [id number]                                     +--------+------------------+-----+--------+--------+ +---------+------------------+-----+--------+-------+  Left     Lt Pressure (mmHg)IndexWaveformComment +---------+------------------+-----+--------+-------+ Brachial 177                                    +---------+------------------+-----+--------+-------+ PTA      197               1.11                 +---------+------------------+-----+--------+-------+ DP       165               0.93                 +---------+------------------+-----+--------+-------+ Great Toe108               0.61                 +---------+------------------+-----+--------+-------+ +-------+-----------+-----------+------------+------------+ ABI/TBIToday's ABIToday's TBIPrevious ABIPrevious TBI +-------+-----------+-----------+------------+------------+ Right                                                 +-------+-----------+-----------+------------+------------+ Left   1.11       0.61       1.16        0.53         +-------+-----------+-----------+------------+------------+ Left ABIs appear essentially unchanged compared to prior study on 05/01/2017. Compared to prior study on 05/01/2017.  Summary: Left: Resting left ankle-brachial index is within normal range. No evidence of significant left lower extremity arterial disease. The left toe-brachial index is abnormal.  *See table(s) above for measurements and observations.    Preliminary    - pertinent xrays, CT, MRI studies were  reviewed and independently interpreted  Positive ROS: All other systems have been reviewed and were otherwise negative with the exception of those mentioned in the HPI and as above.  Physical Exam: General: Alert, no acute distress Psychiatric: Patient is competent for consent with normal mood and affect Lymphatic: No axillary or cervical lymphadenopathy Cardiovascular: No pedal edema Respiratory: No cyanosis, no use of accessory musculature GI: No organomegaly, abdomen is soft and non-tender    Images:  @ENCIMAGES @  Labs:  Lab Results  Component Value Date   HGBA1C 7.6 (H) 03/07/2017   HGBA1C 9.4 (H) 12/03/2014   ESRSEDRATE 46 (H) 04/18/2018   ESRSEDRATE 2 12/04/2017   CRP 6.8 (H) 04/18/2018   CRP 3.1 12/04/2017   REPTSTATUS PENDING 04/18/2018   GRAMSTAIN  11/14/2017    RARE WBC PRESENT, PREDOMINANTLY MONONUCLEAR RARE GRAM POSITIVE COCCI    CULT  04/18/2018    NO GROWTH 1 DAY Performed at [facility], [address] [place], [place] [address]    [facility] 11/14/2017    Lab Results  Component Value Date   ALBUMIN 3.0 (L) 04/19/2018   ALBUMIN 3.3 (L) 04/18/2018   ALBUMIN 3.0 (L) 12/02/2014    Neurologic: Patient does not have protective sensation bilateral lower extremities.   MUSCULOSKELETAL:   Skin: Examination patient has cellulitis and swelling of the fourth toe left foot.  The cellulitis that extended dorsally on the foot and up the leg has resolved.  The cellulitis is now localized to the fourth toe and the forefoot.  Patient does have a palpable dorsalis pedis and posterior tibial pulse.  Ankle-brachial indices shows adequate circulation  from a blood pressure standpoint.  These numbers are falsely elevated due to his peripheral vascular disease.  Review the MRI scan does show some edema in the left fourth toe consistent with early osteomyelitis.  There is no signs of abscess.  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  Plan: Plan: I will follow the patient during his hospital stay discussed that if this cellulitis does not completely resolve with IV antibiotics that a fourth ray amputation is an option.  Discussed that with his peripheral vascular disease there is about a 50% chance that the surgical incision would not heal.  I will continue to follow and patient may require a fourth ray amputation during this hospitalization.  Thank you for the consult and the opportunity to see Brian Zavala  Brian Duda, MD [facility] [phone] 7:20 AM

## 2018-04-21 ENCOUNTER — Ambulatory Visit (INDEPENDENT_AMBULATORY_CARE_PROVIDER_SITE_OTHER): Payer: Self-pay | Admitting: Physician Assistant

## 2018-04-21 LAB — BASIC METABOLIC PANEL
Anion gap: 8 (ref 5–15)
BUN: 6 mg/dL — ABNORMAL LOW (ref 8–23)
CO2: 26 mmol/L (ref 22–32)
Calcium: 8.2 mg/dL — ABNORMAL LOW (ref 8.9–10.3)
Chloride: 102 mmol/L (ref 98–111)
Creatinine, Ser: 0.92 mg/dL (ref 0.61–1.24)
GFR calc Af Amer: >60 mL/min (ref >60)
GFR calc non Af Amer: >60 mL/min (ref >60)
Glucose, Bld: 396 mg/dL — ABNORMAL HIGH (ref 70–99)
Potassium: 4.3 mmol/L (ref 3.5–5.1)
Sodium: 136 mmol/L (ref 135–145)

## 2018-04-21 LAB — CBC
HCT: 42.5 % (ref 39.0–52.0)
Hemoglobin: 13.5 g/dL (ref 13.0–17.0)
MCH: 26.6 pg (ref 26.0–34.0)
MCHC: 31.8 g/dL (ref 30.0–36.0)
MCV: 83.8 fL (ref 80.0–100.0)
Platelets: 259 10*3/uL (ref 150–400)
RBC: 5.07 MIL/uL (ref 4.22–5.81)
RDW: 15.5 % (ref 11.5–15.5)
WBC: 5.2 10*3/uL (ref 4.0–10.5)
nRBC: 0 % (ref 0.0–0.2)

## 2018-04-21 LAB — GLUCOSE, CAPILLARY
Glucose-Capillary: 320 mg/dL — ABNORMAL HIGH (ref 70–99)
Glucose-Capillary: 314 mg/dL — ABNORMAL HIGH (ref 70–99)
Glucose-Capillary: 286 mg/dL — ABNORMAL HIGH (ref 70–99)
Glucose-Capillary: 170 mg/dL — ABNORMAL HIGH (ref 70–99)

## 2018-04-21 LAB — HEMOGLOBIN A1C
Hgb A1c MFr Bld: 8 % — ABNORMAL HIGH (ref 4.8–5.6)
Mean Plasma Glucose: 183 mg/dL

## 2018-04-21 LAB — C-REACTIVE PROTEIN: CRP: 1.4 mg/dL — ABNORMAL HIGH (ref <1.0)

## 2018-04-21 MED ORDER — CHLORHEXIDINE GLUCONATE 4 % EX LIQD
60.0000 mL | Freq: Once | CUTANEOUS | Status: AC
  Administered 2018-04-22: 4 via TOPICAL

## 2018-04-21 MED ORDER — OXYCODONE HCL 5 MG PO TABS: 10.0000 mg | ORAL_TABLET | Freq: Four times a day (QID) | ORAL | Status: DC | PRN

## 2018-04-21 MED ORDER — CEFAZOLIN SODIUM-DEXTROSE 2-4 GM/100ML-% IV SOLN
2.0000 g | INTRAVENOUS | Status: DC
  Filled 2018-04-21: qty 100

## 2018-04-21 MED ORDER — INSULIN GLARGINE 100 UNIT/ML ~~LOC~~ SOLN
20.0000 [IU] | Freq: Every day | SUBCUTANEOUS | Status: DC
  Administered 2018-04-21 – 2018-04-22 (×2): 20 [IU] via SUBCUTANEOUS
  Filled 2018-04-21 (×2): qty 0.2

## 2018-04-21 NOTE — Progress Notes (Signed)
   Subjective: He reports that he is still having some pain, his oxycodone was discontinued. No acute events overnight. We discussed that since the antibiotics weren't working that he will need surgery. Dr. Lajoyce Corners had seen him and told him that he will have surgery tomorrow. He was concerned about the healing since his right knee took so long to heal.   Objective:  Vital signs in last 24 hours: Vitals:   04/20/18 1402 04/20/18 2223 04/21/18 0532 04/21/18 1021  BP: (!) 155/86 (!) 163/80 (!) 164/89 (!) 152/76  Pulse: 81 86 84 80  Resp: 18  18 18   Temp: 98.2 F (36.8 C) 97.7 F (36.5 C) 99.2 F (37.3 C) 98.7 F (37.1 C)  TempSrc: Oral Oral Oral Oral  SpO2: 97% 100% 96% 95%  Weight:      Height:       General: Elderly male, resting in bed, NAD HEENT: Eyes: Conjunctive are normal Cardiac: RRR, no murmur Pulmonary: In no further distress, no rale, no wheeze Abdomen: Is soft, nontender to palpation, BS are present Extremities: Right below the knee amputation.  Left foot swelling and redness improved, however left fourth toe has sausage toe appearance with redness and swelling. No tenderness. Has some opening on that tip w/o active drainage. With probable exposed bone.  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 Erythema improving with abx. No signs of systemic symptoms. No fever. No leukocytosis. No growth on blood cultures so far.  ABI/TBI Nl. Orthopedic is  on board. Planned for surgery tomorrow..  CRP 1.4 from 6.8, 3 days ago.  - Continue Ceftriaxone, Vancomycin, and Metronidazole  -4th toe amputation tomorrow. Per Dr. Lajoyce Corners -NPO after midnight -Ct aspirin but holding Plavix due to surgery.  - F/u blood cultures (No growth sofar) -Restart Oxy IR , he also has tylenol PRN for pain.   PVD s/p R BKA. Left ABI/TBI nl -Will hold plavix but keep him on ASA due to surgery tomorrow. May resume after surgery  DMII - Last A1c 7.4 one year ago. Current home regimen includes insulin glargine 135u qhs, victoza 1.8mg  daily, and metformin. With episode of hypoglycemia during admission. Yesterday hyperglycemic at 200-300. BG today . Will  - Ct Insulin Glargin 16 unit at bed time. probably need more Insulin after surgery. - Novolog 6u TID with meals - Resistant SSI - Continue home lyrica 75mg  qam and 150mg  qpm -Monitor CBG -May increase Insulin tommorow  HTN:  BP at 150s/160s.Patient is asymptomatic.Will monitor tomorrow after surgery. If still high, may increase HCTZ. - Continue home hctz 12.5mg  daily -Will monitor BP  HLD - Continue home lipitor  RA - Continue home leflunomide, prednisone 2mg  daily - Continue home  Morphin (MS Contin) 100mg  q12hrs - He takes enbrel weekly on Wednesdays.  Diet: NPO after midnight  IV fluid: None VTE ppx: Levonox (May hold tonight dose and do SCDs for tomorrow surgery) Code status: Full  Dispo: Anticipated discharge in approximately 2-3 days

## 2018-04-21 NOTE — Progress Notes (Signed)
Inpatient Diabetes Program Recommendations  AACE/ADA: New Consensus Statement on Inpatient Glycemic Control  Target Ranges:  Prepandial:   less than 140 mg/dL      Peak postprandial:   less than 180 mg/dL (1-2 hours)      Critically ill patients:  140 - 180 mg/dL  Results for Brian Zavala, Brian Zavala (MRN [id number]) as of 04/21/2018 15:33  Ref. Range 04/20/2018 07:56 04/20/2018 12:22 04/20/2018 17:26 04/20/2018 22:27 04/21/2018 09:48 04/21/2018 12:13  Glucose-Capillary Latest Ref Range: 70 - 99 mg/dL 536 (H) 144 (H) 315 (H) 82 320 (H) 314 (H)    Review of Glycemic Control  Diabetes history: DM2 Outpatient Diabetes medications: Victoza 1.8 mg QPM, Metformin 1000 mg BID, Toujeo 135 units QPM Current orders for Inpatient glycemic control: Lantus 16 units QHS, Novolog 6 units TID with meals, Novolog 0-20 units TID with meals, Novolog 0-5 units QHS; Prednisone 2 mg QAM  Inpatient Diabetes Program Recommendations:  Insulin - Basal: Please consider increasing Lantus to 32 units QHS (based on 80.9 kg x 0.4 units).  Thanks, Orlando Penner, RN, MSN, CDE Diabetes Coordinator Inpatient Diabetes Program [phone] (Team Pager from 8am to 5pm)

## 2018-04-21 NOTE — Progress Notes (Signed)
Patient ID: Brian Zavala, male   DOB: [birth year], 68 y.o.   MRN: [id number] Patient is seen in follow-up for the left foot.  The cellulitis has resolved patient has fluctuance around the fourth toe the ulcer was decompressed there is purulent drainage there is exposed bone.  Discussed with the patient and his wife the need to proceed with surgery tomorrow for amputation of the fourth toe.  Discussed risks and benefits including risk of the wound not healing.

## 2018-04-21 NOTE — H&P (View-Only) (Signed)
Patient ID: Brian Zavala, male   DOB: 08/09/1950, 67 y.o.   MRN: [id number] Patient is seen in follow-up for the left foot.  The cellulitis has resolved patient has fluctuance around the fourth toe the ulcer was decompressed there is purulent drainage there is exposed bone.  Discussed with the patient and his wife the need to proceed with surgery tomorrow for amputation of the fourth toe.  Discussed risks and benefits including risk of the wound not healing. 

## 2018-04-21 NOTE — Progress Notes (Signed)
Internal Medicine Attending:   I saw and examined the patient. I reviewed the resident's note and I agree with the resident's findings and plan as documented in the resident's note.  Patient complains of some pain in his left foot but feels well otherwise.  Patient was initially made to the hospital with osteomyelitis of his left fourth toe with surrounding cellulitis.  We will continue with antibiotics (ceftriaxone, vancomycin and metronidazole) for now.  His lower extremity cellulitis is improving.  He has no fevers and no leukocytosis.  His blood cultures remain negative to date. However, patient was noted to have fluctuance around his left fourth toe by orthopedics and was noted to have purulent drainage when they examined him.  Patient is scheduled for left fourth toe amputation tomorrow.  We will keep him n.p.o. after midnight.  Continue with pain control.  No further work-up at this time.  Of note, patient's blood sugars have been elevated today up to the 300s.  He would benefit from increasing his Lantus to 32 units.  However, he is n.p.o. after midnight for surgery tomorrow and has had an episode of hypoglycemia during this hospitalization and I would be cautious to increase this too much today.  We will increase his Lantus to 20 units for now and monitor his blood sugars.

## 2018-04-21 NOTE — Care Management Important Message (Signed)
Important Message  Patient Details  Name: Brian Zavala MRN: [id number] Date of Birth: 01/20/1951   Medicare Important Message Given:  Yes    Megan P Shular 04/21/2018, 1:12 PM

## 2018-04-22 ENCOUNTER — Encounter (HOSPITAL_COMMUNITY)
Admission: EM | Disposition: A | Discharge status: Home / Self Care | Payer: Self-pay | Source: Home / Self Care | Attending: Internal Medicine

## 2018-04-22 ENCOUNTER — Encounter (HOSPITAL_COMMUNITY): Payer: Self-pay | Admitting: General Practice

## 2018-04-22 ENCOUNTER — Inpatient Hospital Stay (HOSPITAL_COMMUNITY): Payer: Medicare (Managed Care) | Admitting: Anesthesiology

## 2018-04-22 HISTORY — PX: AMPUTATION: SHX166

## 2018-04-22 LAB — GLUCOSE, CAPILLARY
Glucose-Capillary: 103 mg/dL — ABNORMAL HIGH (ref 70–99)
Glucose-Capillary: 134 mg/dL — ABNORMAL HIGH (ref 70–99)
Glucose-Capillary: 186 mg/dL — ABNORMAL HIGH (ref 70–99)
Glucose-Capillary: 255 mg/dL — ABNORMAL HIGH (ref 70–99)
Glucose-Capillary: 385 mg/dL — ABNORMAL HIGH (ref 70–99)
Glucose-Capillary: 97 mg/dL (ref 70–99)

## 2018-04-22 LAB — CBC
HCT: 42.9 % (ref 39.0–52.0)
Hemoglobin: 13.5 g/dL (ref 13.0–17.0)
MCH: 25.9 pg — ABNORMAL LOW (ref 26.0–34.0)
MCHC: 31.5 g/dL (ref 30.0–36.0)
MCV: 82.2 fL (ref 80.0–100.0)
Platelets: 276 10*3/uL (ref 150–400)
RBC: 5.22 MIL/uL (ref 4.22–5.81)
RDW: 15.1 % (ref 11.5–15.5)
WBC: 7.8 10*3/uL (ref 4.0–10.5)
nRBC: 0 % (ref 0.0–0.2)

## 2018-04-22 LAB — BASIC METABOLIC PANEL
Anion gap: 7 (ref 5–15)
BUN: 7 mg/dL — ABNORMAL LOW (ref 8–23)
CO2: 27 mmol/L (ref 22–32)
Calcium: 8.4 mg/dL — ABNORMAL LOW (ref 8.9–10.3)
Chloride: 104 mmol/L (ref 98–111)
Creatinine, Ser: 0.74 mg/dL (ref 0.61–1.24)
GFR calc Af Amer: >60 mL/min (ref >60)
GFR calc non Af Amer: >60 mL/min (ref >60)
Glucose, Bld: 106 mg/dL — ABNORMAL HIGH (ref 70–99)
Potassium: 3.4 mmol/L — ABNORMAL LOW (ref 3.5–5.1)
Sodium: 138 mmol/L (ref 135–145)

## 2018-04-22 LAB — SURGICAL PCR SCREEN
MRSA, PCR: NEGATIVE
Staphylococcus aureus: NEGATIVE

## 2018-04-22 SURGERY — AMPUTATION, FOOT, RAY
Anesthesia: General | Laterality: Left

## 2018-04-22 MED ORDER — DEXAMETHASONE SODIUM PHOSPHATE 10 MG/ML IJ SOLN
INTRAMUSCULAR | Status: DC | PRN
  Administered 2018-04-22: 5 mg via INTRAVENOUS

## 2018-04-22 MED ORDER — METOCLOPRAMIDE HCL 5 MG PO TABS: 5.0000 mg | ORAL_TABLET | Freq: Three times a day (TID) | ORAL | Status: DC | PRN

## 2018-04-22 MED ORDER — CLOPIDOGREL BISULFATE 75 MG PO TABS
75.0000 mg | ORAL_TABLET | Freq: Every day | ORAL | Status: DC
  Administered 2018-04-23: 75 mg via ORAL
  Filled 2018-04-22: qty 1

## 2018-04-22 MED ORDER — HYDROMORPHONE HCL 1 MG/ML IJ SOLN
0.5000 mg | INTRAMUSCULAR | Status: DC | PRN
  Administered 2018-04-23: 1 mg via INTRAVENOUS
  Filled 2018-04-22 (×2): qty 1

## 2018-04-22 MED ORDER — PROPOFOL 10 MG/ML IV BOLUS
INTRAVENOUS | Status: AC
  Filled 2018-04-22: qty 20

## 2018-04-22 MED ORDER — ACETAMINOPHEN 160 MG/5ML PO SOLN: 325.0000 mg | ORAL | Status: DC | PRN

## 2018-04-22 MED ORDER — METHOCARBAMOL 1000 MG/10ML IJ SOLN
500.0000 mg | Freq: Four times a day (QID) | INTRAVENOUS | Status: DC | PRN
  Filled 2018-04-22: qty 5

## 2018-04-22 MED ORDER — METHOCARBAMOL 500 MG PO TABS
500.0000 mg | ORAL_TABLET | Freq: Four times a day (QID) | ORAL | Status: DC | PRN
  Administered 2018-04-22 – 2018-04-23 (×2): 500 mg via ORAL
  Filled 2018-04-22 (×2): qty 1

## 2018-04-22 MED ORDER — ACETAMINOPHEN 325 MG PO TABS: 325.0000 mg | ORAL_TABLET | Freq: Four times a day (QID) | ORAL | Status: DC | PRN

## 2018-04-22 MED ORDER — ENOXAPARIN SODIUM 40 MG/0.4ML ~~LOC~~ SOLN
40.0000 mg | SUBCUTANEOUS | Status: DC
  Administered 2018-04-23: 40 mg via SUBCUTANEOUS
  Filled 2018-04-22: qty 0.4

## 2018-04-22 MED ORDER — 0.9 % SODIUM CHLORIDE (POUR BTL) OPTIME
TOPICAL | Status: DC | PRN
  Administered 2018-04-22: 1000 mL

## 2018-04-22 MED ORDER — SODIUM CHLORIDE 0.9 % IV SOLN
INTRAVENOUS | Status: DC
  Administered 2018-04-22: 16:00:00 via INTRAVENOUS

## 2018-04-22 MED ORDER — OXYCODONE HCL 5 MG PO TABS: 5.0000 mg | ORAL_TABLET | Freq: Once | ORAL | Status: DC | PRN

## 2018-04-22 MED ORDER — FENTANYL CITRATE (PF) 100 MCG/2ML IJ SOLN: 25.0000 ug | INTRAMUSCULAR | Status: DC | PRN

## 2018-04-22 MED ORDER — ONDANSETRON HCL 4 MG/2ML IJ SOLN
INTRAMUSCULAR | Status: DC | PRN
  Administered 2018-04-22: 4 mg via INTRAVENOUS

## 2018-04-22 MED ORDER — MEPERIDINE HCL 50 MG/ML IJ SOLN: 6.2500 mg | INTRAMUSCULAR | Status: DC | PRN

## 2018-04-22 MED ORDER — LACTATED RINGERS IV SOLN
INTRAVENOUS | Status: DC
  Administered 2018-04-22: 13:00:00 via INTRAVENOUS

## 2018-04-22 MED ORDER — MIDAZOLAM HCL 2 MG/2ML IJ SOLN
INTRAMUSCULAR | Status: DC | PRN
  Administered 2018-04-22: 2 mg via INTRAVENOUS

## 2018-04-22 MED ORDER — ONDANSETRON HCL 4 MG/2ML IJ SOLN: 4.0000 mg | Freq: Once | INTRAMUSCULAR | Status: DC | PRN

## 2018-04-22 MED ORDER — POLYETHYLENE GLYCOL 3350 17 G PO PACK: 17.0000 g | PACK | Freq: Every day | ORAL | Status: DC | PRN

## 2018-04-22 MED ORDER — LIDOCAINE 2% (20 MG/ML) 5 ML SYRINGE
INTRAMUSCULAR | Status: DC | PRN
  Administered 2018-04-22: 60 mg via INTRAVENOUS

## 2018-04-22 MED ORDER — PHENYLEPHRINE 40 MCG/ML (10ML) SYRINGE FOR IV PUSH (FOR BLOOD PRESSURE SUPPORT)
PREFILLED_SYRINGE | INTRAVENOUS | Status: DC | PRN
  Administered 2018-04-22 (×2): 120 ug via INTRAVENOUS
  Administered 2018-04-22: 80 ug via INTRAVENOUS

## 2018-04-22 MED ORDER — MIDAZOLAM HCL 2 MG/2ML IJ SOLN
INTRAMUSCULAR | Status: AC
  Filled 2018-04-22: qty 2

## 2018-04-22 MED ORDER — ONDANSETRON HCL 4 MG/2ML IJ SOLN: 4.0000 mg | Freq: Four times a day (QID) | INTRAMUSCULAR | Status: DC | PRN

## 2018-04-22 MED ORDER — OXYCODONE HCL 5 MG PO TABS
5.0000 mg | ORAL_TABLET | ORAL | Status: DC | PRN
  Administered 2018-04-23: 10 mg via ORAL
  Filled 2018-04-22: qty 2

## 2018-04-22 MED ORDER — METOCLOPRAMIDE HCL 5 MG/ML IJ SOLN: 5.0000 mg | Freq: Three times a day (TID) | INTRAMUSCULAR | Status: DC | PRN

## 2018-04-22 MED ORDER — FENTANYL CITRATE (PF) 250 MCG/5ML IJ SOLN
INTRAMUSCULAR | Status: AC
  Filled 2018-04-22: qty 5

## 2018-04-22 MED ORDER — FENTANYL CITRATE (PF) 250 MCG/5ML IJ SOLN
INTRAMUSCULAR | Status: DC | PRN
  Administered 2018-04-22: 25 ug via INTRAVENOUS

## 2018-04-22 MED ORDER — DOCUSATE SODIUM 100 MG PO CAPS: 100.0000 mg | ORAL_CAPSULE | Freq: Two times a day (BID) | ORAL | Status: DC

## 2018-04-22 MED ORDER — OXYCODONE HCL 5 MG/5ML PO SOLN: 5.0000 mg | Freq: Once | ORAL | Status: DC | PRN

## 2018-04-22 MED ORDER — MAGNESIUM CITRATE PO SOLN: 1.0000 | Freq: Once | ORAL | Status: DC | PRN

## 2018-04-22 MED ORDER — BISACODYL 10 MG RE SUPP: 10.0000 mg | Freq: Every day | RECTAL | Status: DC | PRN

## 2018-04-22 MED ORDER — EPHEDRINE SULFATE-NACL 50-0.9 MG/10ML-% IV SOSY
PREFILLED_SYRINGE | INTRAVENOUS | Status: DC | PRN
  Administered 2018-04-22 (×2): 10 mg via INTRAVENOUS

## 2018-04-22 MED ORDER — OXYCODONE HCL 5 MG PO TABS
10.0000 mg | ORAL_TABLET | ORAL | Status: DC | PRN
  Administered 2018-04-22: 15 mg via ORAL
  Administered 2018-04-22: 10 mg via ORAL
  Filled 2018-04-22: qty 2
  Filled 2018-04-22: qty 3

## 2018-04-22 MED ORDER — PROPOFOL 10 MG/ML IV BOLUS
INTRAVENOUS | Status: DC | PRN
  Administered 2018-04-22: 140 mg via INTRAVENOUS

## 2018-04-22 MED ORDER — ONDANSETRON HCL 4 MG PO TABS: 4.0000 mg | ORAL_TABLET | Freq: Four times a day (QID) | ORAL | Status: DC | PRN

## 2018-04-22 MED ORDER — ACETAMINOPHEN 325 MG PO TABS: 325.0000 mg | ORAL_TABLET | ORAL | Status: DC | PRN

## 2018-04-22 SURGICAL SUPPLY — 28 items

## 2018-04-22 NOTE — Anesthesia Postprocedure Evaluation (Signed)
Anesthesia Post Note  Patient: [name]  Procedure(s) Performed: LEFT FOOT 4TH RAY AMPUTATION (Left )     Patient location during evaluation: PACU Anesthesia Type: General Level of consciousness: awake and alert Pain management: pain level controlled Vital Signs Assessment: post-procedure vital signs reviewed and stable Respiratory status: spontaneous breathing, nonlabored ventilation, respiratory function stable and patient connected to nasal cannula oxygen Cardiovascular status: blood pressure returned to baseline and stable Postop Assessment: no apparent nausea or vomiting Anesthetic complications: no    Last Vitals:  Vitals:   04/22/18 1515 04/22/18 1526  BP:  102/69  Pulse:  86  Resp:  16  Temp: 36.5 C 36.4 C  SpO2:  96%    Last Pain:  Vitals:   04/22/18 1526  TempSrc: Oral  PainSc:                  ODDONO,ERNEST

## 2018-04-22 NOTE — Progress Notes (Signed)
Internal Medicine Attending:   I saw and examined the patient. I reviewed the resident's note and I agree with the resident's findings and plan as documented in the resident's note.  Patient states that he feels well today and has no new complaints.  Patient was initially made to the hospital with left fourth toe osteomyelitis and has been on IV antibiotics since admission.  Patient was evaluated by orthopedics yesterday and was noted to have increasing fluctuance in his left fourth toe and is scheduled for a left fourth toe amputation today.  We will continue with ceftriaxone, vancomycin and metronidazole for now.  Patient is afebrile with no leukocytosis and his blood cultures have been negative to date.  Will discuss the need for further antibiotics with orthopedics after the surgery.  Continue with pain control for now.  No further work-up at this time.

## 2018-04-22 NOTE — Progress Notes (Signed)
Orthopedic Tech Progress Note Patient Details:  Brian Zavala Dec 23, 1950 [id number]  Ortho Devices Type of Ortho Device: Postop shoe/boot Ortho Device/Splint Interventions: Ordered, Adjustment, Application   Post Interventions Patient Tolerated: Well Instructions Provided: Care of device, Adjustment of device   [name] 04/22/2018, 4:46 PM

## 2018-04-22 NOTE — Progress Notes (Addendum)
   Subjective: Pt denies pain or discomfort this morning.  His main concern is when he can eat. No other complaints. No dizziness. No pain. He is waiting for surgery.  Objective:  Vital signs in last 24 hours: Vitals:   04/21/18 1021 04/21/18 1550 04/21/18 2113 04/22/18 0611  BP: (!) 152/76 126/82 (!) 169/95 (!) 161/98  Pulse: 80 77 77 78  Resp: 18  17 18   Temp: 98.7 F (37.1 C) 98.5 F (36.9 C) 98.4 F (36.9 C) 97.8 F (36.6 C)  TempSrc: Oral Oral Oral Oral  SpO2: 95% 96% 97% 97%  Weight:      Height:       General: Elderly male, resting in bed, NAD HEENT: Eyes: Conjunctive are normal Cardiac: RRR, no murmur Pulmonary: In no further distress, no rale, no wheeze Abdomen: Is soft, nontender to palpation, BS are present Extremities: Right below the knee amputation.  Left foot cellulitis improved, however left fourth toe has fluctuance with redness and swelling. No tenderness. Has some opening on that tip w/o active drainage.  Pulses are intact Neurologic exam: Patient is alert and oriented x3 Psychiatric exam: Patient is has normal mood, affect and behavior  Assessment/Plan:  Principal Problem:   Osteomyelitis of fourth toe of left foot (HCC) Active Problems:   Diabetes mellitus, insulin dependent (IDDM), controlled (HCC)   Type 2 diabetes mellitus with diabetic neuropathy, with long-term current use of insulin (HCC)   Diabetic foot infection (HCC)   Peripheral vascular disease (HCC)   Cellulitis of left foot  Osteomyelitis of left 4th toe Will get left 4th toe amputation today.  Plan for amputation surgery today. Is on Ceftriaxone, Vancomycin, and Metronidazole.  No evidence of systemic infection. Afebrile. No leukocytosis. BC negative so far. -to OR  today - Continue Ceftriaxone, Vancomycin, and Metronidazole  -Is NPO for surgery and holding Plavix and Levonox -Ct aspirin  - F/u blood cultures (No growth sofar) -On Oxy IR for pain.  PVD s/p R BKA. Left ABI/TBI  nl -Will hold plavix but keep him on ASA due to surgery tomorrow. May resume after surgery. Left ABI/TBI nl.  DMII Last A1c 7.4 one year ago. Current home regimen includes insulin glargine 135u qhs, victoza 1.8mg  daily, and metformin. Episodes of hypoglycemia early on addmisson and Insulin dose cut back.  But then had hyperglycemia. Lantus increased to 20 u yesterday due to hyperglycemia >300, BG improved to 96 last night and 106 today.  - Ct Insulin Glargin 20 unit at bed time. probably need more Insulin after surgery. - Novolog 6u TID with meals - Resistant SSI - Continue home lyrica 75mg  qam and 150mg  qpm -Monitor CBG -May increase Insulin tommorow  HTN:  Mildly hypertensive, likely due to pain and infection .Patient is asymptomatic. Will continue current meds and monitor BP after surgery. If still high, may increase HCTZ. - Continue home hctz 12.5mg  daily -Will monitor BP  HLD - Continue home lipitor  RA - Continue home leflunomide, prednisone 2mg  daily - Continue home  Morphin (MS Contin) 100mg  q12hrs - He takes enbrel weekly on Wednesdays. We hold this for now due to infection.  Diet: NPO for surgery IV fluid: None VTE ppx: SCD s (May resume Levonox after surgery if no bleeding) Code status: Full  Dispo: Anticipated discharge in approximately 1-2 days

## 2018-04-22 NOTE — Anesthesia Preprocedure Evaluation (Signed)
Anesthesia Evaluation  Patient identified by MRN, date of birth, ID band Patient awake    Reviewed: Allergy & Precautions, NPO status , Patient's Chart, lab work & pertinent test results  History of Anesthesia Complications Negative for: history of anesthetic complications  Airway Mallampati: I  TM Distance: >3 FB Neck ROM: Full    Dental  (+) Poor Dentition, Missing, Dental Advisory Given, Chipped   Pulmonary COPD, Current Smoker, former smoker,    breath sounds clear to auscultation       Cardiovascular hypertension, Pt. on medications (-) angina+ Peripheral Vascular Disease   Rhythm:Regular Rate:Normal  '07 ECHO: EF 55-60%, valves OK   Neuro/Psych negative neurological ROS     GI/Hepatic negative GI ROS, Neg liver ROS,   Endo/Other  diabetes, Oral Hypoglycemic Agents, Insulin Dependent  Renal/GU negative Renal ROS     Musculoskeletal  (+) Arthritis , steroids,    Abdominal   Peds  Hematology negative hematology ROS (+)   Anesthesia Other Findings   Reproductive/Obstetrics                             Anesthesia Physical  Anesthesia Plan  ASA: III  Anesthesia Plan: General   Post-op Pain Management:    Induction: Intravenous  PONV Risk Score and Plan: 3 and Ondansetron, Dexamethasone, Treatment may vary due to age or medical condition and Midazolam  Airway Management Planned: LMA  Additional Equipment:   Intra-op Plan:   Post-operative Plan:   Informed Consent: I have reviewed the patients History and Physical, chart, labs and discussed the procedure including the risks, benefits and alternatives for the proposed anesthesia with the patient or authorized representative who has indicated his/her understanding and acceptance.     Dental advisory given  Plan Discussed with: CRNA and Surgeon  Anesthesia Plan Comments: (Plan routine monitors, GA, LMA OK)         Anesthesia Quick Evaluation

## 2018-04-22 NOTE — Op Note (Signed)
04/22/2018  2:49 PM  PATIENT:  Brian Zavala    PRE-OPERATIVE DIAGNOSIS:  Abscess Osteomyelitis Left 4th Toe  POST-OPERATIVE DIAGNOSIS:  Same  PROCEDURE:  LEFT FOOT 4TH RAY AMPUTATION  Cultures obtained x2.  SURGEON:  [name], MD  PHYSICIAN ASSISTANT:None ANESTHESIA:   General  PREOPERATIVE INDICATIONS:  Brian Zavala is a  68 y.o. male with a diagnosis of Abscess Osteomyelitis Left 4th Toe who failed conservative measures and elected for surgical management.    The risks benefits and alternatives were discussed with the patient preoperatively including but not limited to the risks of infection, bleeding, nerve injury, cardiopulmonary complications, the need for revision surgery, among others, and the patient was willing to proceed.  OPERATIVE IMPLANTS: None  @ENCIMAGES @  OPERATIVE FINDINGS: Minimal petechial bleeding.  Abscess extended into the MTP joint which necessitated resection of the metatarsal head.  Cultures obtained x2.  OPERATIVE PROCEDURE: Patient was brought the operating room and underwent a general anesthetic.  After adequate levels anesthesia were obtained patient's left lower extremity was prepped using ChloraPrep draped in a sterile field a timeout was called.  The incision was made just distal to the MTP joint left fourth toe.  There is purulent abscess that extended into the MTP joint.  Cultures were obtained.  The toe was amputated through the neck of the fourth metatarsal.  After further irrigation there is no evidence of any necrotic tissue.  There was minimal petechial bleeding.  The incision was closed using 2-0 nylon and a sterile dressing was applied.   DISCHARGE PLANNING:  Antibiotic duration: Continue antibiotics as per infectious disease recommendations.  Weightbearing: Nonweightbearing on the left.  Pain medication: Opioid pathway ordered.  Dressing care/ Wound VAC: Change dressing in 1 week.  Ambulatory devices:  Walker.  Discharge to: Home.  Follow-up: In the office 1 week post operative.

## 2018-04-22 NOTE — Transfer of Care (Signed)
Immediate Anesthesia Transfer of Care Note  Patient: JAIRE CLAYDON  Procedure(s) Performed: LEFT FOOT 4TH RAY AMPUTATION (Left )  Patient Location: PACU  Anesthesia Type:General  Level of Consciousness: drowsy  Airway & Oxygen Therapy: Patient Spontanous Breathing and Patient connected to nasal cannula oxygen  Post-op Assessment: Report given to RN and Post -op Vital signs reviewed and stable  Post vital signs: Reviewed and stable  Last Vitals:  Vitals Value Taken Time  BP 118/50 04/22/2018  2:57 PM  Temp    Pulse 76 04/22/2018  2:59 PM  Resp 17 04/22/2018  2:59 PM  SpO2 96 % 04/22/2018  2:59 PM  Vitals shown include unvalidated device data.  Last Pain:  Vitals:   04/22/18 1241  TempSrc:   PainSc: 0-No pain      Patients Stated Pain Goal: 5 (04/18/18 2024)  Complications: No apparent anesthesia complications

## 2018-04-22 NOTE — Anesthesia Procedure Notes (Signed)
Procedure Name: LMA Insertion Date/Time: 04/22/2018 2:29 PM Performed by: Elliot Dally, CRNA Pre-anesthesia Checklist: Patient identified, Emergency Drugs available, Suction available and Patient being monitored Patient Re-evaluated:Patient Re-evaluated prior to induction Oxygen Delivery Method: Circle System Utilized Preoxygenation: Pre-oxygenation with 100% oxygen Induction Type: IV induction Ventilation: Mask ventilation without difficulty LMA: LMA inserted LMA Size: 4.0 Number of attempts: 1 Airway Equipment and Method: Bite block Placement Confirmation: positive ETCO2 Tube secured with: Tape Dental Injury: Teeth and Oropharynx as per pre-operative assessment

## 2018-04-22 NOTE — Care Management Note (Signed)
Case Management Note  Patient Details  Name: Brian Zavala MRN: [id number] Date of Birth: [birth year]  Subjective/Objective:                    Action/Plan: Dressing care/ Wound VAC: Change dressing in 1 week.  Ambulatory devices: Walker.  Will await post op PT eval  Expected Discharge Date:                  Expected Discharge Plan:     In-House Referral:     Discharge planning Services  CM Consult  Post Acute Care Choice:  Home Health, Durable Medical Equipment Choice offered to:     DME Arranged:  Walker rolling DME Agency:  [facility]  HH Arranged:    [facility] Agency:     Status of Service:  In process, will continue to follow  If discussed at Long Length of Stay Meetings, dates discussed:    Additional Comments:  Kingsley Plan, RN 04/22/2018, 4:34 PM

## 2018-04-22 NOTE — Interval H&P Note (Signed)
History and Physical Interval Note:  04/22/2018 8:00 AM  Brian Zavala  has presented today for surgery, with the diagnosis of Abscess Osteomyelitis Left 4th Toe  The various methods of treatment have been discussed with the patient and family. After consideration of risks, benefits and other options for treatment, the patient has consented to  Procedure(s): LEFT FOOT 4TH RAY AMPUTATION (Left) as a surgical intervention .  The patient's history has been reviewed, patient examined, no change in status, stable for surgery.  I have reviewed the patient's chart and labs.  Questions were answered to the patient's satisfaction.     [name]

## 2018-04-22 NOTE — Progress Notes (Signed)
Pharmacy Antibiotic Note  Brian Zavala is a 68 y.o. male admitted on 04/18/2018 with left foot redness and swelling.  Pharmacy has been consulted for vancomycin dosing for diabetic foot infection that is purulent.  Patient is also on Rocephin.  He has a history of osteo with BKA in 2006. Recent abx for ulcer on stump. He was followed by Dr. Orvan Falconer with ID. Xray showed possible osteo.  Renal function is stable, afebrile, WBC WNL.   Plan: Continue vanc 1500mg  IV Q12H (AUC 484, SCr 0.66) Rocephin 2gm IV Q24H per MD Monitor renal fxn, clinical progress, abx LOT post amputation today   Height: 6\' 1"  (185.4 cm) Weight: 178 lb 5.6 oz (80.9 kg) IBW/kg (Calculated) : 79.9  Temp (24hrs), Avg:98.2 F (36.8 C), Min:97.8 F (36.6 C), Max:98.5 F (36.9 C)  Recent Labs  Lab 04/18/18 1140 04/18/18 1143 04/19/18 0248 04/20/18 0536 04/21/18 0746 04/22/18 0148  WBC 10.0  --  7.8 6.0 5.2 7.8  CREATININE 0.82  --  0.66 0.80 0.92 0.74  LATICACIDVEN  --  1.8  --   --   --   --     Estimated Creatinine Clearance: 101.3 mL/min (by C-G formula based on SCr of 0.74 mg/dL).    Allergies  Allergen Reactions  . Varenicline Other (See Comments)    Upset stomach    Vanc 2/15 >> CTX 2/15 >> Flagyl 2/15 >>  2/15 BCx - NGTD 2/18 surgical PCR - negative  Thuy D. Laney Potash, PharmD, BCPS, BCCCP 04/22/2018, 12:22 PM

## 2018-04-23 ENCOUNTER — Encounter (HOSPITAL_COMMUNITY): Payer: Self-pay | Admitting: Orthopedic Surgery

## 2018-04-23 DIAGNOSIS — Z89422 Acquired absence of other left toe(s): Secondary | ICD-10-CM

## 2018-04-23 LAB — CULTURE, BLOOD (ROUTINE X 2)
Culture: NO GROWTH
Culture: NO GROWTH

## 2018-04-23 LAB — BASIC METABOLIC PANEL
Anion gap: 10 (ref 5–15)
BUN: 11 mg/dL (ref 8–23)
CO2: 23 mmol/L (ref 22–32)
Calcium: 8.7 mg/dL — ABNORMAL LOW (ref 8.9–10.3)
Chloride: 103 mmol/L (ref 98–111)
Creatinine, Ser: 0.79 mg/dL (ref 0.61–1.24)
GFR calc Af Amer: >60 mL/min (ref >60)
GFR calc non Af Amer: >60 mL/min (ref >60)
Glucose, Bld: 205 mg/dL — ABNORMAL HIGH (ref 70–99)
Potassium: 4.4 mmol/L (ref 3.5–5.1)
Sodium: 136 mmol/L (ref 135–145)

## 2018-04-23 LAB — CBC
HCT: 42.3 % (ref 39.0–52.0)
Hemoglobin: 13.4 g/dL (ref 13.0–17.0)
MCH: 26.2 pg (ref 26.0–34.0)
MCHC: 31.7 g/dL (ref 30.0–36.0)
MCV: 82.8 fL (ref 80.0–100.0)
Platelets: 284 10*3/uL (ref 150–400)
RBC: 5.11 MIL/uL (ref 4.22–5.81)
RDW: 15 % (ref 11.5–15.5)
WBC: 9.7 10*3/uL (ref 4.0–10.5)
nRBC: 0 % (ref 0.0–0.2)

## 2018-04-23 LAB — GLUCOSE, CAPILLARY: Glucose-Capillary: 173 mg/dL — ABNORMAL HIGH (ref 70–99)

## 2018-04-23 MED ORDER — OXYCODONE-ACETAMINOPHEN 5-325 MG PO TABS: 1.0000 | ORAL_TABLET | ORAL | 0 refills | Status: DC | PRN

## 2018-04-23 NOTE — Discharge Instructions (Signed)
Non weight bearing on left foot as much as possible and elevate left foot as much as possible.

## 2018-04-23 NOTE — Discharge Summary (Signed)
Name: Brian Zavala MRN: [id number] DOB: Aug 24, 1950 68 y.o. PCP: [facility]  Date of Admission: 04/18/2018 11:19 AM Date of Discharge: 04/23/2018 Attending Physician: No att. providers found  Dr. Heide Spark  Discharge Diagnosis: 1. Active Problems:   Diabetes mellitus, insulin dependent (IDDM), controlled (HCC)   Type 2 diabetes mellitus with diabetic neuropathy, with long-term current use of insulin (HCC)   Diabetic foot infection (HCC)   Peripheral vascular disease (HCC)   Cellulitis of left foot   Discharge Medications: Allergies as of 04/23/2018      Reactions   Varenicline Other (See Comments)   Upset stomach      Medication List    TAKE these medications   aspirin 81 MG chewable tablet Chew 81 mg by mouth daily.   atorvastatin 80 MG tablet Commonly known as:  LIPITOR Take 1 tablet (80 mg total) by mouth daily at 6 PM.   CALCIUM + D PO Take 1 tablet by mouth daily.   cholecalciferol 1000 units tablet Commonly known as:  VITAMIN D Take 1,000 Units by mouth daily.   clopidogrel 75 MG tablet Commonly known as:  PLAVIX Take 1 tablet (75 mg total) by mouth daily with breakfast.   docusate sodium 100 MG capsule Commonly known as:  COLACE Take 100 mg by mouth 2 (two) times daily.   etanercept 50 MG/ML injection Commonly known as:  ENBREL Inject 50 mg into the skin every 7 (seven) days. Wednesdays   eucerin cream Apply 1 application topically 2 (two) times daily.   furosemide 20 MG tablet Commonly known as:  LASIX Take 20 mg by mouth daily as needed for fluid.   hydrochlorothiazide 12.5 MG capsule Commonly known as:  MICROZIDE Take 12.5 mg by mouth daily.   leflunomide 20 MG tablet Commonly known as:  ARAVA Take 20 mg by mouth daily.   metFORMIN 1000 MG tablet Commonly known as:  GLUCOPHAGE Take 1,000 mg by mouth 2 (two) times daily.   morphine 100 MG 12 hr tablet Commonly known as:  MS CONTIN Take 100 mg by  mouth every 12 (twelve) hours.   oxyCODONE-acetaminophen 5-325 MG tablet Commonly known as:  PERCOCET/ROXICET Take 1 tablet by mouth every 4 (four) hours as needed for severe pain (break through pain).   predniSONE 1 MG tablet Commonly known as:  DELTASONE Take 2 mg by mouth daily with breakfast.   pregabalin 75 MG capsule Commonly known as:  LYRICA Take 75-150 mg by mouth 2 (two) times daily. Take 75 mg in the morning and 150 mg in the evening   TOUJEO SOLOSTAR [place] Inject 135 Units into the skin every evening.   VICTOZA 18 MG/3ML Soln injection Generic drug:  Liraglutide Inject 1.8 mg into the skin every evening.   vitamin B-12 100 MCG tablet Commonly known as:  CYANOCOBALAMIN Take 100 mcg by mouth daily.       Disposition and follow-up:   [name] was discharged from [facility] in Stable condition.  At the hospital follow up visit please address:  1.  Patient presented with left foot cellulitis, left 4th toe osteomyelitis and abscess . Patient underwent amputation surgery at 04/22/2018. Please evaluate for any evidence of infection, make sure he follows up with orthopedic surgery after discharge.  2. DMII: Recommended to f/u with PCP in few days. Please encourage for close follow up for DM control.   2.  Labs / imaging needed at time of follow-up:  CBG  3.  Pending labs/ test needing follow-up: BC, surgical specimen culture Follow-up Appointments: Follow-up Information    Nadara Mustarduda, Marcus V, MD In 1 week.   Specialty:  Orthopedic Surgery Contact information: [address] [place] [address] [phone]        [facility]. Schedule an appointment as soon as possible for a visit in 1 week(s).   Why:  Make sure to schedule an appointment next week for follow Contact information: [address] [place] [address] [phone]           Hospital Course by problem list: 1.  Osteomyelitis of left 4th toe [name] is a 68 yo male with PMHx of Insulin dependent DMII with peripheral neuropathy, previous right BKA in 2006, peripheral vascular disease s/p DES to right superficial femoral artery in 2019 (on aspirin and plavix) HTN, HLD, and RA who presented with left foot redness and swelling and found to have 4th toe osteomyelitis. Initially received 3 days of IV Ceftriaxone, Metronidazole and Vancomycin, with partial improvement. He then developed some fluctuation on exam. Orthopedic surgeon was on board and planned to do amputation surgery.  Patient stable after surgery. Discharged without antibiotic.  DMII: Patient was on home regimen of: Toujeo 135u qhs, victoza 1.8mg  daily, and Metformin 1000 BID. Last A1c 7.4 one year ago. Repeat A1c 2/16/20here: 8. In hospital, he was initially on 75 u of Glargin at bedtime and resistant SSI. However, he hadepisodes of hypoglycemia during admissions. And Insulin dose decreased to 16 unit at bed time (gradually increased to 20 U at discharge.) and Novolog 6u TID with meals added. We talked to his PCP regarding probable need for decreasing current home dose (135u) of long acting Insulin and adding meal time units. Patient will remain on previous home dose until he sees his PCP.  HTN:BP mostly remained control during this hospitalization with home HCTZ 12.5 mg daily. He is discharge with home meds HCTZ and PRN 20 mg Lasix.  HLD PVD s/p DES to right superficial femoral artery in 2019  with Hx of right BKA. Was on ASA and Plavix and Lipitor at home.  Left ABI/TBI performed at this hospitalization and was normal. We held Plavixdue to surgery and resumed after discharge.   Rheumatoid arthritis: Stable and asymptomatic during this hospitalization. Home meds: leflunomide 20 mg QD, prednisone 2mg  daily and weekly Enbrel. He received Leflunide and dalily Prednison at hospital but Enbrel held due to active infection. It will be  resumed  after discharge.  Discharge Vitals:   BP 119/77 (BP Location: Right Arm)   Pulse 84   Temp 98 F (36.7 C) (Oral)   Resp 20   Ht 6\' 1"  (1.854 m)   Wt 81.6 kg   SpO2 98%   BMI 23.75 kg/m   Pertinent Labs, Studies, and Procedures:  MR foot left: 04/18/2018 1. Marrow changes in the distal phalanx of the 4th toe are suspicious for early osteomyelitis given the adjacent soft tissue ulceration. No gross cortical destruction or joint effusion. 2. Soft tissue swelling and skin ulceration in the 4th toe without soft tissue abscess. Dorsal forefoot subcutaneous edema. 3. No other acute osseous findings. Degenerative changes at the 1st metatarsophalangeal and Lisfranc joints. HbA1c: 8 MRSA screen: negative BC: negative x 5 days  Discharge Instructions: Discharge Instructions    Call MD / Call 911   Complete by:  As directed    If you experience chest pain or shortness  of breath, CALL 911 and be transported to the hospital emergency room.  If you develope a fever above 101 F, pus (white drainage) or increased drainage or redness at the wound, or calf pain, call your surgeon's office.   Call MD for:  severe uncontrolled pain   Complete by:  As directed    Call MD for:  temperature >100.4   Complete by:  As directed    Constipation Prevention   Complete by:  As directed    Drink plenty of fluids.  Prune juice may be helpful.  You may use a stool softener, such as Colace (over the counter) 100 mg twice a day.  Use MiraLax (over the counter) for constipation as needed.   Diet - low sodium heart healthy   Complete by:  As directed    Diet - low sodium heart healthy   Complete by:  As directed    Discharge instructions   Complete by:  As directed    Thanks for allowing [place] taking care of you at [facility].  You came in due to left toe infection and osteomyelitis.  We are glad that you are doing well after amputation surgery.  Please make sure to follow-up with your  orthopedic surgeon at scheduled for you.  Also talk to your primary care doctor about your blood sugar.  Please make sure to follow-up with him to see if you need any adjustment in your diabetic medications. Please take your medications as instructed and follows at [phone] if you have any question or concern.   Increase activity slowly   Complete by:  As directed    Increase activity slowly as tolerated   Complete by:  As directed       Signed: Chevis Pretty, MD 05/03/2018, 1:56 PM   Pager: [phone]

## 2018-04-23 NOTE — Discharge Summary (Signed)
Discharge Diagnoses:  Principal Problem:   Osteomyelitis of fourth toe of left foot (HCC) Active Problems:   Diabetes mellitus, insulin dependent (IDDM), controlled (HCC)   Type 2 diabetes mellitus with diabetic neuropathy, with long-term current use of insulin (HCC)   Diabetic foot infection (HCC)   Peripheral vascular disease (HCC)   Cellulitis of left foot   Surgeries: Procedure(s): LEFT FOOT 4TH RAY AMPUTATION on 04/22/2018    Consultants:   Discharged Condition: Improved  Hospital Course: Brian Zavala is an 68 y.o. male who was admitted 04/18/2018 with a chief complaint of left foot osteomyelitis, ulceration, with a final diagnosis of Abscess Osteomyelitis Left 4th Toe.  Patient was brought to the operating room on 04/22/2018 and underwent Procedure(s): LEFT FOOT 4TH RAY AMPUTATION.    Patient was given perioperative antibiotics:  Anti-infectives (From admission, onward)   Start     Dose/Rate Route Frequency Ordered Stop   04/22/18 0600  ceFAZolin (ANCEF) IVPB 2g/100 mL premix  Status:  Discontinued     2 g 200 mL/hr over 30 Minutes Intravenous On call to O.R. 04/21/18 2159 04/22/18 1520   04/20/18 1400  metroNIDAZOLE (FLAGYL) tablet 500 mg     500 mg Oral Every 8 hours 04/20/18 1330     04/19/18 1600  vancomycin (VANCOCIN) 1,500 mg in sodium chloride 0.9 % 500 mL IVPB     1,500 mg 250 mL/hr over 120 Minutes Intravenous Every 12 hours 04/19/18 1300     04/19/18 0400  vancomycin (VANCOCIN) 1,250 mg in sodium chloride 0.9 % 250 mL IVPB  Status:  Discontinued     1,250 mg 166.7 mL/hr over 90 Minutes Intravenous Every 12 hours 04/18/18 1653 04/19/18 1300   04/18/18 1300  vancomycin (VANCOCIN) 1,500 mg in sodium chloride 0.9 % 500 mL IVPB     1,500 mg 250 mL/hr over 120 Minutes Intravenous  Once 04/18/18 1202 04/18/18 1549   04/18/18 1145  cefTRIAXone (ROCEPHIN) 2 g in sodium chloride 0.9 % 100 mL IVPB     2 g 200 mL/hr over 30 Minutes Intravenous Every 24 hours 04/18/18  1142     04/18/18 1145  metroNIDAZOLE (FLAGYL) IVPB 500 mg  Status:  Discontinued     500 mg 100 mL/hr over 60 Minutes Intravenous Every 8 hours 04/18/18 1142 04/20/18 1330    .  Patient was given sequential compression devices, early ambulation, and aspirin for DVT prophylaxis.  Recent vital signs:  Patient Vitals for the past 24 hrs:  BP Temp Temp src Pulse Resp SpO2 Height Weight  04/23/18 0258 114/73 98.2 F (36.8 C) Oral 71 20 93 % - -  04/22/18 2106 (!) 145/86 (!) 97.5 F (36.4 C) Oral 98 20 97 % - -  04/22/18 1757 138/88 98.3 F (36.8 C) Oral 97 17 98 % - -  04/22/18 1526 102/69 97.6 F (36.4 C) Oral 86 16 96 % - -  04/22/18 1515 - 97.7 F (36.5 C) - - - - - -  04/22/18 1511 102/64 - - 86 15 97 % - -  04/22/18 1500 (!) 118/50 (!) 97 F (36.1 C) - 76 17 96 % - -  04/22/18 1241 - - - - - -  (1.854 m) 81.6 kg  .  Recent laboratory studies: No results found.  Discharge Medications:   Allergies as of 04/23/2018      Reactions   Varenicline Other (See Comments)   Upset stomach      Medication List  TAKE these medications   aspirin 81 MG chewable tablet Chew 81 mg by mouth daily.   atorvastatin 80 MG tablet Commonly known as:  LIPITOR Take 1 tablet (80 mg total) by mouth daily at 6 PM.   CALCIUM + D PO Take 1 tablet by mouth daily.   cholecalciferol 1000 units tablet Commonly known as:  VITAMIN D Take 1,000 Units by mouth daily.   clopidogrel 75 MG tablet Commonly known as:  PLAVIX Take 1 tablet (75 mg total) by mouth daily with breakfast.   docusate sodium 100 MG capsule Commonly known as:  COLACE Take 100 mg by mouth 2 (two) times daily.   etanercept 50 MG/ML injection Commonly known as:  ENBREL Inject 50 mg into the skin every 7 (seven) days. Wednesdays   eucerin cream Apply 1 application topically 2 (two) times daily.   furosemide 20 MG tablet Commonly known as:  LASIX Take 20 mg by mouth daily as needed for fluid.    hydrochlorothiazide 12.5 MG capsule Commonly known as:  MICROZIDE Take 12.5 mg by mouth daily.   leflunomide 20 MG tablet Commonly known as:  ARAVA Take 20 mg by mouth daily.   metFORMIN 1000 MG tablet Commonly known as:  GLUCOPHAGE Take 1,000 mg by mouth 2 (two) times daily.   morphine 100 MG 12 hr tablet Commonly known as:  MS CONTIN Take 100 mg by mouth every 12 (twelve) hours.   oxyCODONE-acetaminophen 5-325 MG tablet Commonly known as:  PERCOCET/ROXICET Take 1 tablet by mouth every 4 (four) hours as needed for severe pain (break through pain).   predniSONE 1 MG tablet Commonly known as:  DELTASONE Take 2 mg by mouth daily with breakfast.   pregabalin 75 MG capsule Commonly known as:  LYRICA Take 75-150 mg by mouth 2 (two) times daily. Take 75 mg in the morning and 150 mg in the evening   TOUJEO SOLOSTAR Hyde Inject 135 Units into the skin every evening.   VICTOZA 18 MG/3ML Soln injection Generic drug:  Liraglutide Inject 1.8 mg into the skin every evening.   vitamin B-12 100 MCG tablet Commonly known as:  CYANOCOBALAMIN Take 100 mcg by mouth daily.            Durable Medical Equipment  (From admission, onward)         Start     Ordered   04/22/18 1635  For home use only DME Walker rolling  Once    Question:  Patient needs a walker to treat with the following condition  Answer:  Osteomyelitis (HCC)   04/22/18 1635          Diagnostic Studies: Mr Foot Left Wo Contrast  Result Date: 04/18/2018 CLINICAL DATA:  Left foot swelling and erythema with sore on the 4th toe. Diabetes. Osteomyelitis suspected. EXAM: MRI OF THE LEFT FOOT WITHOUT CONTRAST TECHNIQUE: Multiplanar, multisequence MR imaging of the left forefoot was performed. No intravenous contrast was administered. COMPARISON:  Radiographs same date. FINDINGS: Bones/Joint/Cartilage There is soft tissue swelling distally in the 4th toe with ulceration along the lateral aspect of the nail bed. The distal  4th phalanx demonstrates abnormal marrow T2 hyperintensity. There is also mildly decreased T1 marrow signal, but no gross cortical destruction. The middle and proximal phalanges appear normal. There are no significant joint effusions. The additional toes appear normal. There are mild-to-moderate degenerative changes the 1st metatarsophalangeal joint. Mild degenerative changes are also present at the Lisfranc joint without associated subluxation. Ligaments The Lisfranc ligament is  intact. Muscles and Tendons Diffuse fatty atrophy throughout the forefoot musculature. No significant tenosynovitis. Soft tissues Subcutaneous edema throughout the dorsum of the forefoot. No focal fluid collection. As above, swelling in the small toe with skin ulceration along the lateral aspect of the nail bed. IMPRESSION: 1. Marrow changes in the distal phalanx of the 4th toe are suspicious for early osteomyelitis given the adjacent soft tissue ulceration. No gross cortical destruction or joint effusion. 2. Soft tissue swelling and skin ulceration in the 4th toe without soft tissue abscess. Dorsal forefoot subcutaneous edema. 3. No other acute osseous findings. Degenerative changes at the 1st metatarsophalangeal and Lisfranc joints. Electronically Signed   By: Carey BullocksWilliam  Veazey M.D.   On: 04/18/2018 17:15   Dg Foot Complete Left  Result Date: 04/18/2018 CLINICAL DATA:  Sore on fourth toe.  Redness and swelling. EXAM: LEFT FOOT - COMPLETE 3+ VIEW COMPARISON:  None. FINDINGS: There is an ulceration on the fourth toe. There is associated swelling of the fourth toe. The distal phalanx of the fourth toe is not well assessed. There does appear to be some mild irregularity underlying the ulceration. No other acute abnormalities on today's study. There is a large plantar spur. Degenerative changes are seen in the midfoot. Soft tissue swelling over the top of the foot. IMPRESSION: 1. There appears to be an ulceration in the soft tissues of the  distal fourth toe. The foci of air may be in the ulceration or in the adjacent soft tissues. There is irregularity of the underlying coughed which is incompletely evaluated on today's study. Findings are concerning for osteomyelitis. An MRI could further assess. Electronically Signed   By: Gerome Samavid  Williams III M.D   On: 04/18/2018 14:14   Vas [place] Abi With/wo Tbi  Result Date: 04/20/2018 LOWER EXTREMITY DOPPLER STUDY Indications: Gangrene, and Peripheral vascular disease. Gangrene on left              digits. High Risk Factors: Hypertension, Diabetes.  Vascular Interventions: Right peripheral vascular intervention surgery on                         05/05/2017. Limitations: Right BKA. Comparison Study: ABI/TBI on 05/01/2017. Performing Technologist: Melodie BouillonOLE, Selina RDMS, RVT  Examination Guidelines: A complete evaluation includes at minimum, Doppler waveform signals and systolic blood pressure reading at the level of bilateral brachial, anterior tibial, and posterior tibial arteries, when vessel segments are accessible. Bilateral testing is considered an integral part of a complete examination. Photoelectric Plethysmograph (PPG) waveforms and toe systolic pressure readings are included as required and additional duplex testing as needed. Limited examinations for reoccurring indications may be performed as noted.  ABI Findings: +--------+------------------+-----+--------+--------+ Right   Rt Pressure (mmHg)IndexWaveformComment  +--------+------------------+-----+--------+--------+ [id number]                                     +--------+------------------+-----+--------+--------+ +---------+------------------+-----+--------+-------+ Left     Lt Pressure (mmHg)IndexWaveformComment +---------+------------------+-----+--------+-------+ Brachial 177                                    +---------+------------------+-----+--------+-------+ PTA      197               1.11                  +---------+------------------+-----+--------+-------+ DP  165               0.93                 +---------+------------------+-----+--------+-------+ Great Toe108               0.61                 +---------+------------------+-----+--------+-------+ +-------+-----------+-----------+------------+------------+ ABI/TBIToday's ABIToday's TBIPrevious ABIPrevious TBI +-------+-----------+-----------+------------+------------+ Right                                                 +-------+-----------+-----------+------------+------------+ Left   1.11       0.61       1.16        0.53         +-------+-----------+-----------+------------+------------+ Left ABIs appear essentially unchanged compared to prior study on 05/01/2017. Compared to prior study on 05/01/2017.  Summary: Left: Resting left ankle-brachial index is within normal range. No evidence of significant left lower extremity arterial disease. The left toe-brachial index is abnormal.  *See table(s) above for measurements and observations.  Electronically signed by Sherald Hess MD on 04/20/2018 at 5:12:01 PM.   Final     Patient benefited maximally from their hospital stay and there were no complications.     Disposition: Discharge disposition: 01-Home or Self Care      Discharge Instructions    Call MD / Call 911   Complete by:  As directed    If you experience chest pain or shortness of breath, CALL 911 and be transported to the hospital emergency room.  If you develope a fever above 101 F, pus (white drainage) or increased drainage or redness at the wound, or calf pain, call your surgeon's office.   Constipation Prevention   Complete by:  As directed    Drink plenty of fluids.  Prune juice may be helpful.  You may use a stool softener, such as Colace (over the counter) 100 mg twice a day.  Use MiraLax (over the counter) for constipation as needed.   Diet - low sodium heart healthy   Complete by:  As  directed    Increase activity slowly as tolerated   Complete by:  As directed      Follow-up Information    [name], MD In 1 week.   Specialty:  Orthopedic Surgery Contact information: [address] [place] [address] [phone]            Signed: Franki Monte 04/23/2018, 9:02 AM [facility] [phone]

## 2018-04-23 NOTE — Progress Notes (Signed)
   Subjective: Patient was seen and evaluated at bedside on morning rounds. No acute events overnight.  He is doing very well after surgery and mentions that he is ready to go home.  Has tolerated p.o. diet very well.  Mentions that does not have major pain at site of surgery.  Objective:  Vital signs in last 24 hours: Vitals:   04/22/18 1526 04/22/18 1757 04/22/18 2106 04/23/18 0258  BP: 102/69 138/88 (!) 145/86 114/73  Pulse: 86 97 98 71  Resp: 16 17 20 20   Temp: 97.6 F (36.4 C) 98.3 F (36.8 C) (!) 97.5 F (36.4 C) 98.2 F (36.8 C)  TempSrc: Oral Oral Oral Oral  SpO2: 96% 98% 97% 93%  Weight:      Height:       General: Elderly male, eating on the wheelchair, NAD HEENT: Eyes: Conjunctive are normal Cardiac: RRR, no murmur Pulmonary: In no further distress, no rale, no wheeze Abdomen: Is soft, nontender to palpation, BS are present Extremities: Right below the knee amputation.    Left foot is wrapped.  Exposed toes are pink. neurologic exam: Patient is alert and oriented x3 Psychiatric exam: Patient is has normal mood, affect and behavior   Assessment/Plan:  Principal Problem:   Osteomyelitis of fourth toe of left foot (HCC) Active Problems:   Diabetes mellitus, insulin dependent (IDDM), controlled (HCC)   Type 2 diabetes mellitus with diabetic neuropathy, with long-term current use of insulin (HCC)   Diabetic foot infection (HCC)   Peripheral vascular disease (HCC)   Cellulitis of left foot   PVD s/p R BKA. Left ABI/TBI nl S.p left 4th toe ray amputation. Post op day 1 No complication after surgery. Orthopedic discharge patient with p.o. pain control. -Follow-up: In the ortho office 1 week post operative   DMII Last A1c 7.4 one year ago. Current home regimen includes insulin glargine 135u qhs, victoza 1.8mg  daily, and metformin. Episodes of hypoglycemia early on addmisson and Insulin dose cut back. But then had hyperglycemia. Currently on Lantus 20 um  Resistant SSI and Novolog 6 u TID.  I talked to patient PCP on the phone about patient may take benefit of decreasing long-acting insulin (he is on 135 units of glargine at home) and adding short acting meal insulin.  He believes that based on patient's blood glucose over last year, you need such high dose of long-acting insulin.  We decided not to change his insulin at discharge but he will follow-up his PCP in the next few days to decide about adjusting insulin. -Follow-up with PCP in next few days -Continue regimen of DM medications at home  HTN:  Patient is normotensive today. -Continue home HCTZ 12.5 mg daily  HLD - Continue home lipitor  RA - Continue home leflunomide, prednisone 2mg  daily - Continue homeMorphin (MS Contin)100mg  q12hrs -Continue Enbrel weekly'  Dispo: Charge to home today  Chevis Pretty, MD 04/23/2018, 5:33 AM Pager: [phone]

## 2018-04-23 NOTE — Care Management Note (Signed)
Case Management Note  Patient Details  Name: Brian Zavala MRN: [id number] Date of Birth: 01-20-1951  Subjective/Objective:                    Action/Plan:  Received a call from Ann [phone] social worker with [facility]. If patient needs ride home, home health , DME she will assist.   Spoke to patient at bedside. Patient has wheelchair, 2 walkers at home and states he does not need therapy. PT here at [facility] scheduled to see patient this morning. Patient again stated he does not need therapy and his ride is waiting downstairs for him.   Call Ann back to let her know.  Expected Discharge Date:  04/23/18               Expected Discharge Plan:     In-House Referral:  NA  Discharge planning Services  CM Consult  Post Acute Care Choice:  Home Health, Durable Medical Equipment Choice offered to:  Patient  DME Arranged:  N/A DME Agency:  NA  HH Arranged:  Patient Refused HH HH Agency:  NA  Status of Service:  Completed, signed off  If discussed at Long Length of Stay Meetings, dates discussed:    Additional Comments:  Kingsley Plan, RN 04/23/2018, 10:09 AM

## 2018-04-23 NOTE — Progress Notes (Signed)
Internal Medicine Attending:   I saw and examined the patient. I reviewed the resident's note and I agree with the resident's findings and plan as documented in the resident's note.  Patient feels well today with no new complaints.  He states that he is ready to go home.  Patient is initially admitted to the hospital with left fourth toe osteomyelitis and is now postop day 1 from a left fourth toe amputation.  Patient was seen by orthopedics earlier this morning and was cleared for discharge.  No further work-up at this time.  Patient stable for DC home today.

## 2018-04-23 NOTE — Evaluation (Signed)
Physical Therapy Evaluation Patient Details Name: Brian Zavala MRN: [id number] DOB: Jul 15, 1950 Today's Date: 04/23/2018   History of Present Illness  Patient is a 68 y/o male s/p LEFT FOOT 4TH RAY AMPUTATION on 04/22/2018. 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    Clinical Impression  Patient admitted s/p above listed procedure. Patient up in personal w/c upon PT arrival with patient stating he was able to complete lateral transfer to w/c with Mod I without need for assist/guarding. Patient reports he has ample DME at home for all needs. Able to independently navigate w/c through doorways and hallway without issue. No further acute PT needs identified. PT to sign off.     Follow Up Recommendations No PT follow up    Equipment Recommendations  None recommended by PT    Recommendations for Other Services       Precautions / Restrictions Precautions Precautions: Fall Restrictions Weight Bearing Restrictions: Yes LLE Weight Bearing: Non weight bearing      Mobility  Bed Mobility               General bed mobility comments: up in w/c  Transfers Overall transfer level: Modified independent               General transfer comment: reports he laterally scooted to w/c with Mod I today - did not require physical assist/guarding  Ambulation/Gait             General Gait Details: n/t  Administrator mobility: Yes Wheelchair propulsion: Both upper extremities;Left lower extremity Wheelchair parts: Independent Distance: 100 Wheelchair Assistance Details (indicate cue type and reason): able to navigate doorways and obstacles in hallway without issue  Modified Rankin (Stroke Patients Only)        Balance Overall balance assessment: No apparent balance deficits (not formally assessed)                                           Pertinent Vitals/Pain Pain Assessment: No/denies pain    Home Living Family/patient expects to be discharged to:: Private residence Living Arrangements: Alone Available Help at Discharge: Family;Available PRN/intermittently Type of Home: House Home Access: Ramped entrance     Home Layout: One level Home Equipment: Walker - 2 wheels;Wheelchair - Engineer, technical sales - power;Shower seat      Prior Function Level of Independence: Independent with assistive device(s)         Comments: use of w/c for primary means of mobility     Hand Dominance        Extremity/Trunk Assessment   Upper Extremity Assessment Upper Extremity Assessment: Overall WFL for tasks assessed    Lower Extremity Assessment Lower Extremity Assessment: RLE deficits/detail;LLE deficits/detail RLE Deficits / Details: old R BKA LLE Deficits / Details: L 4th ray amputation - NWB    Cervical / Trunk Assessment Cervical / Trunk Assessment: Normal  Communication   Communication: No difficulties  Cognition Arousal/Alertness: Awake/alert Behavior During Therapy: WFL for tasks assessed/performed Overall Cognitive Status: Within Functional Limits for tasks assessed  General Comments General comments (skin integrity, edema, etc.): family present and supportive    Exercises     Assessment/Plan    PT Assessment Patent does not need any further PT services  PT Problem List         PT Treatment Interventions      PT Goals (Current goals can be found in the Care Plan section)  Acute Rehab PT Goals Patient Stated Goal: return home today PT Goal Formulation: All assessment and education complete, DC therapy    Frequency     Barriers to discharge        Co-evaluation                AM-PAC PT "6 Clicks" Mobility  Outcome Measure Help needed turning from your back to your side while in a flat bed without using bedrails?: None Help needed moving from lying on your back to sitting on the side of a flat bed without using bedrails?: None Help needed moving to and from a bed to a chair (including a wheelchair)?: A Little Help needed standing up from a chair using your arms (e.g., wheelchair or bedside chair)?: Total Help needed to walk in hospital room?: Total Help needed climbing 3-5 steps with a railing? : Total 6 Click Score: 14    End of Session   Activity Tolerance: Patient tolerated treatment well Patient left: in chair;with family/visitor present Nurse Communication: Mobility status PT Visit Diagnosis: Unsteadiness on feet (R26.81)    Time: 1010-1020 PT Time Calculation (min) (ACUTE ONLY): 10 min   Charges:   PT Evaluation $PT Eval Moderate Complexity: 1 Mod          Kipp Laurence, PT, DPT Supplemental Physical Therapist 04/23/18 10:26 AM Pager: [phone] Office: [phone]

## 2018-04-27 LAB — AEROBIC/ANAEROBIC CULTURE W GRAM STAIN (SURGICAL/DEEP WOUND)

## 2018-04-28 ENCOUNTER — Telehealth (INDEPENDENT_AMBULATORY_CARE_PROVIDER_SITE_OTHER): Payer: Self-pay

## 2018-04-28 ENCOUNTER — Other Ambulatory Visit (INDEPENDENT_AMBULATORY_CARE_PROVIDER_SITE_OTHER): Payer: Self-pay

## 2018-04-28 MED ORDER — DOXYCYCLINE HYCLATE 100 MG PO CAPS: 100.0000 mg | ORAL_CAPSULE | Freq: Two times a day (BID) | ORAL | 0 refills | Status: DC

## 2018-04-28 NOTE — Telephone Encounter (Signed)
I called pt to advise of positive cultures per Dr. Lajoyce Corners and that an rx for doxycycline 100 mg bid x 14 days had been faxed to the [facility]. Pt asked if this could be faxed to [facility] it is a mail order pharmacy. I asked the pt when he would get the medication if he went through mail order he states that it would be the next day. He will not pick up the rx from local pharm because it will be an out of pocket expense for him. I faxed rx to requested pharm and pt states that he has a previous rx for doxy that he will take today pending the arrival of his mail order rx. To call with questions.

## 2018-04-29 ENCOUNTER — Encounter (INDEPENDENT_AMBULATORY_CARE_PROVIDER_SITE_OTHER): Payer: Self-pay | Admitting: Family

## 2018-04-29 ENCOUNTER — Ambulatory Visit (INDEPENDENT_AMBULATORY_CARE_PROVIDER_SITE_OTHER): Payer: Medicare (Managed Care) | Admitting: Family

## 2018-04-29 ENCOUNTER — Inpatient Hospital Stay (INDEPENDENT_AMBULATORY_CARE_PROVIDER_SITE_OTHER): Payer: Medicare (Managed Care) | Admitting: Orthopedic Surgery

## 2018-04-29 VITALS — Ht 73.0 in | Wt 180.0 lb

## 2018-04-29 DIAGNOSIS — Z89422 Acquired absence of other left toe(s): Secondary | ICD-10-CM

## 2018-04-29 NOTE — Progress Notes (Signed)
Post-Op Visit Note   Patient: Brian Zavala           Date of Birth: March 05, 1950           MRN: [id number] Visit Date: 04/29/2018 PCP: [facility]  Chief Complaint:  Chief Complaint  Patient presents with  . Left Foot - Routine Post Op    4th ray amp 04/22/2018    HPI:  HPI The patient is a 68 year old gentleman seen today status post left 4th ray amputation.  In a motorized wheelchair today sister accompanies the visit.  Is on doxycycline following intraoperative cultures.  Ortho Exam On examination the incision is well approximated sutures healing well there is scant dried blood.  No surrounding erythema cellulitis is resolving.  No swelling.  No sign of infection.  Visit Diagnoses: No diagnosis found.  Plan: Continue with elevation.  Begin daily Dial soap cleansing.  Apply dry dressings.  Follow-up in office in 1 week for suture removal  Follow-Up Instructions: No follow-ups on file.   Imaging: No results found.  Orders:  No orders of the defined types were placed in this encounter.  No orders of the defined types were placed in this encounter.    PMFS History: Patient Active Problem List   Diagnosis Date Noted  . Cellulitis of left foot   . Osteomyelitis of fourth toe of left foot (HCC) 04/19/2018  . Peripheral vascular disease (HCC) 04/19/2018  . Diabetic foot infection (HCC) 04/18/2018  . Tobacco abuse counseling   . Critical lower limb ischemia 05/02/2017  . Subacute osteomyelitis of right fibula (HCC)   . 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 Diabetes mellitus, insulin dependent (IDDM), controlled (HCC)   . Type 2 diabetes mellitus with diabetic neuropathy, with long-term current use of insulin (HCC)   . S/P unilateral BKA (below knee amputation) (HCC)   . Rheumatoid arthritis (HCC)   . Gallstones 09/23/2013  . Non-intractable cyclical vomiting with nausea 09/23/2013  .  Epidermal inclusion cyst 11/30/2012   Past Medical History:  Diagnosis Date  . Arthritis    "pretty much all over"  . COPD (chronic obstructive pulmonary disease) (HCC)   . Hyperlipidemia   . Hypertension   . Neuromuscular disorder (HCC)   . Neuropathy   . Osteomyelitis (HCC) 04/2018   4th toe left foot  . Type II diabetes mellitus (HCC)   . Wears glasses   . Wears partial dentures    top and bottom partials    Family History  Problem Relation Age of Onset  . Cirrhosis Father     Past Surgical History:  Procedure Laterality Date  . AMPUTATION Left 04/22/2018   Procedure: LEFT FOOT 4TH RAY AMPUTATION;  Surgeon: [name], MD;  Location: [facility] OR;  Service: Orthopedics;  Laterality: Left;  . BACK SURGERY    . CHOLECYSTECTOMY N/A 10/26/2013   Procedure: LAPAROSCOPIC CHOLECYSTECTOMY WITH INTRAOPERATIVE CHOLANGIOGRAM;  Surgeon: Clovis Pu. Cornett, MD;  [facility] OR;  Service: General;  Laterality: N/A;  . COLONOSCOPY    . CYST EXCISION Bilateral 02/15/2015   Procedure: LEFT INDEX FINGER AND RIGHT MIDDLE FINGER NODULE EXCISION;  Surgeon: Tarry Kos, MD;  Location: [facility];  Service: Orthopedics;  Laterality: Bilateral;  . CYST EXCISION PERINEAL N/A 12/08/2012   Procedure: CYST EXCISION PERINeum;  Surgeon: Maisie Fus A. Cornett, MD;  Location:  SURGERY CENTER;  Service: General;  Laterality: N/A;  .  FOOT AMPUTATION Right 2005  . I&D EXTREMITY Right 03/07/2017   Procedure: EXCISION FIBULAR HEAD RIGHT BELOW KNEE AMPUTATION;  Surgeon: [name], MD;  Location: [facility] OR;  Service: Orthopedics;  Laterality: Right;  . INCISE AND DRAIN ABCESS  12/02/2014   PERINEAL ABSCESS  . INCISION AND DRAINAGE PERIRECTAL ABSCESS Left 12/02/2014   Procedure: IRRIGATION AND DEBRIDEMENT PERINEAL ABSCESS;  Surgeon: Abigail Miyamoto, MD;  [facility] OR;  Service: General;  Laterality: Left;  . LEG AMPUTATION BELOW KNEE Right 2005  . LOWER EXTREMITY ANGIOGRAPHY N/A 05/05/2017    Procedure: LOWER EXTREMITY ANGIOGRAPHY;  Surgeon: [name], MD;  [facility] INVAS[facility] CV LAB;  Service: Cardiovascular;  Laterality: N/A;  . LUMBAR DISC SURGERY  82,90   ruptured disc  . PERIPHERAL VASCULAR INTERVENTION Right 05/05/2017   Procedure: PERIPHERAL VASCULAR INTERVENTION;  Surgeon: [name], MD;  Location: [facility] INVASIVE CV LAB;  Service: Cardiovascular;  Laterality: Right;  . SCAR REVISION Right 2005   @ amputation  . TOE AMPUTATION Right 2005   Social History   Occupational History  . Not on file  Tobacco Use  . Smoking status: Former Smoker    Packs/day: 1.00    Years: 47.00    Pack years: 47.00    Types: Cigarettes  . Smokeless tobacco: Never Used  Substance and Sexual Activity  . Alcohol use: No  . Drug use: No  . Sexual activity: Never

## 2018-05-06 ENCOUNTER — Encounter (INDEPENDENT_AMBULATORY_CARE_PROVIDER_SITE_OTHER): Payer: Self-pay | Admitting: Family

## 2018-05-06 ENCOUNTER — Ambulatory Visit (INDEPENDENT_AMBULATORY_CARE_PROVIDER_SITE_OTHER): Payer: Medicare (Managed Care) | Admitting: Family

## 2018-05-06 DIAGNOSIS — Z89422 Acquired absence of other left toe(s): Secondary | ICD-10-CM | POA: Insufficient documentation

## 2018-05-06 NOTE — Progress Notes (Signed)
Post-Op Visit Note   Patient: Brian Zavala           Date of Birth: 05-15-1950           MRN: [id number] Visit Date: 05/06/2018 PCP: [facility]  Chief Complaint:  Chief Complaint  Patient presents with  . Left Foot - Routine Post Op    04/22/2018 left foot 4th toe amp    HPI:  HPI The patient is a 68 year old gentleman seen today status post left 4th ray amputation.  In a motorized wheelchair today, sister accompanies the visit.   Ortho Exam On examination the incision is well approximated and well healed. There is scant dried blood.  No surrounding erythema cellulitis is resolving.  No swelling.  No sign of infection. Sutures harvested.  Visit Diagnoses:  1. H/O amputation of lesser toe, left (HCC)     Plan: Apply dry dressings.  Follow-up in office as needed.  Follow-Up Instructions: Return if symptoms worsen or fail to improve.   Imaging: No results found.  Orders:  No orders of the defined types were placed in this encounter.  No orders of the defined types were placed in this encounter.    PMFS History: Patient Active Problem List   Diagnosis Date Noted  . H/O amputation of lesser toe, left (HCC) 05/06/2018  . Peripheral vascular disease (HCC) 04/19/2018  . Diabetic foot infection (HCC) 04/18/2018  . Tobacco abuse counseling   . Critical lower limb ischemia 05/02/2017  . Subacute osteomyelitis of right fibula (HCC)   . 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 Diabetes mellitus, insulin dependent (IDDM), controlled (HCC)   . Type 2 diabetes mellitus with diabetic neuropathy, with long-term current use of insulin (HCC)   . S/P unilateral BKA (below knee amputation) (HCC)   . Rheumatoid arthritis (HCC)   . Gallstones 09/23/2013  . Non-intractable cyclical vomiting with nausea 09/23/2013  . Epidermal inclusion cyst 11/30/2012   Past Medical History:  Diagnosis Date  .  Arthritis    "pretty much all over"  . Cellulitis of left foot   . COPD (chronic obstructive pulmonary disease) (HCC)   . Hyperlipidemia   . Hypertension   . Neuromuscular disorder (HCC)   . Neuropathy   . Osteomyelitis (HCC) 04/2018   4th toe left foot  . Osteomyelitis of fourth toe of left foot (HCC) 04/19/2018  . Type II diabetes mellitus (HCC)   . Wears glasses   . Wears partial dentures    top and bottom partials    Family History  Problem Relation Age of Onset  . Cirrhosis Father     Past Surgical History:  Procedure Laterality Date  . AMPUTATION Left 04/22/2018   Procedure: LEFT FOOT 4TH RAY AMPUTATION;  Surgeon: [name], MD;  Location: [facility] OR;  Service: Orthopedics;  Laterality: Left;  . BACK SURGERY    . CHOLECYSTECTOMY N/A 10/26/2013   Procedure: LAPAROSCOPIC CHOLECYSTECTOMY WITH INTRAOPERATIVE CHOLANGIOGRAM;  Surgeon: Clovis Pu. Cornett, MD;  [facility] OR;  Service: General;  Laterality: N/A;  . COLONOSCOPY    . CYST EXCISION Bilateral 02/15/2015   Procedure: LEFT INDEX FINGER AND RIGHT MIDDLE FINGER NODULE EXCISION;  Surgeon: Tarry Kos, MD;  Location: [facility];  Service: Orthopedics;  Laterality: Bilateral;  . CYST EXCISION PERINEAL N/A 12/08/2012   Procedure: CYST EXCISION PERINeum;  Surgeon: Maisie Fus A. Cornett, MD;  Location:  SURGERY  CENTER;  Service: General;  Laterality: N/A;  . FOOT AMPUTATION Right 2005  . I&D EXTREMITY Right 03/07/2017   Procedure: EXCISION FIBULAR HEAD RIGHT BELOW KNEE AMPUTATION;  Surgeon: [name], MD;  Location: [facility] OR;  Service: Orthopedics;  Laterality: Right;  . INCISE AND DRAIN ABCESS  12/02/2014   PERINEAL ABSCESS  . INCISION AND DRAINAGE PERIRECTAL ABSCESS Left 12/02/2014   Procedure: IRRIGATION AND DEBRIDEMENT PERINEAL ABSCESS;  Surgeon: Abigail Miyamoto, MD;  [facility] OR;  Service: General;  Laterality: Left;  . LEG AMPUTATION BELOW KNEE Right 2005  . LOWER EXTREMITY ANGIOGRAPHY N/A 05/05/2017    Procedure: LOWER EXTREMITY ANGIOGRAPHY;  Surgeon: [name], MD;  [facility] LAB;  Service: Cardiovascular;  Laterality: N/A;  . LUMBAR DISC SURGERY  82,90   ruptured disc  . PERIPHERAL VASCULAR INTERVENTION Right 05/05/2017   Procedure: PERIPHERAL VASCULAR INTERVENTION;  Surgeon: [name], MD;  Location: [facility] INVASIVE CV LAB;  Service: Cardiovascular;  Laterality: Right;  . SCAR REVISION Right 2005   @ amputation  . TOE AMPUTATION Right 2005   Social History   Occupational History  . Not on file  Tobacco Use  . Smoking status: Former Smoker    Packs/day: 1.00    Years: 47.00    Pack years: 47.00    Types: Cigarettes  . Smokeless tobacco: Never Used  Substance and Sexual Activity  . Alcohol use: No  . Drug use: No  . Sexual activity: Never

## 2018-05-13 ENCOUNTER — Telehealth (INDEPENDENT_AMBULATORY_CARE_PROVIDER_SITE_OTHER): Payer: Self-pay | Admitting: Orthopedic Surgery

## 2018-05-13 NOTE — Telephone Encounter (Signed)
Toniann Fail made an apt for tomorrow morning.

## 2018-05-13 NOTE — Telephone Encounter (Signed)
Annice Pih called who is the pt nurse at [facility] concerning wound care.  Annice Pih stated that she pt left foot 4th toe which was amputated looks macerated.  She wanted to know if we should continue using dry gauze or use something else.  Please advise.  Annice Pih # [phone]

## 2018-05-14 ENCOUNTER — Other Ambulatory Visit: Payer: Self-pay

## 2018-05-14 ENCOUNTER — Ambulatory Visit (INDEPENDENT_AMBULATORY_CARE_PROVIDER_SITE_OTHER): Payer: Medicare (Managed Care) | Admitting: Physician Assistant

## 2018-05-14 ENCOUNTER — Encounter (INDEPENDENT_AMBULATORY_CARE_PROVIDER_SITE_OTHER): Payer: Self-pay | Admitting: Orthopedic Surgery

## 2018-05-14 VITALS — Ht 73.0 in | Wt 180.0 lb

## 2018-05-14 DIAGNOSIS — Z89511 Acquired absence of right leg below knee: Secondary | ICD-10-CM

## 2018-05-14 DIAGNOSIS — Z794 Long term (current) use of insulin: Secondary | ICD-10-CM

## 2018-05-14 DIAGNOSIS — E1140 Type 2 diabetes mellitus with diabetic neuropathy, unspecified: Secondary | ICD-10-CM

## 2018-05-14 DIAGNOSIS — Z89422 Acquired absence of other left toe(s): Secondary | ICD-10-CM

## 2018-05-17 ENCOUNTER — Encounter (INDEPENDENT_AMBULATORY_CARE_PROVIDER_SITE_OTHER): Payer: Self-pay | Admitting: Physician Assistant

## 2018-05-17 NOTE — Progress Notes (Signed)
Office Visit Note   Patient: Brian Zavala           Date of Birth: 01/14/1951           MRN: [id number] Visit Date: 05/14/2018              Requested by: [employer], [facility] [address] [place], [place] [address] PCP: Inc, [facility]  Chief Complaint  Patient presents with  . Left Foot - Routine Post Op    04/22/2018 left foot 4th ray amp      HPI: The patient is a 68 year old male who is seen for postoperative follow-up following left foot fourth ray amputation on 04/22/2018. He had abscess and osteomyelitis of the area and culture showed rare methicillin sensitive staph aureus.  It was pansensitive and he was treated with doxycycline for 1 week postop.  He is concerned today that there is some slight yellow drainage from the incisional site.  He reports no pain over the area and there is been no swelling.  Sutures were removed last week.  Assessment & Plan: Visit Diagnoses:  1. H/O amputation of lesser toe, left (HCC)   2. History of right below knee amputation (HCC)   3. Type 2 diabetes mellitus with diabetic neuropathy, with long-term current use of insulin (HCC)     Plan: Instructed patient to begin wearing a silver compression sock and direct contact with the incisional area around the clock except for showering.  He can use Dial soap and water to the area but he should not submerge or soak the area.  Also discussed offloading the foot as much as possible and elevating the foot as much as possible.  He will follow-up in 1 week.  Follow-Up Instructions: Return in about 1 week (around 05/21/2018).   Ortho Exam  Patient is alert, oriented, no adenopathy, well-dressed, normal affect, normal respiratory effort. The left foot fourth toe amputation site has some slight moisture and maceration of the peri-incisional area with some scant serous appearing drainage but no odor no erythema no edema or signs of cellulitis.  He  has good palpable pedal pulses.  unable to obtain photo today as epic down. Imaging: No results found. No images are attached to the encounter.  Labs: Lab Results  Component Value Date   HGBA1C 8.0 (H) 04/19/2018   HGBA1C 7.6 (H) 03/07/2017   HGBA1C 9.4 (H) 12/03/2014   ESRSEDRATE 46 (H) 04/18/2018   ESRSEDRATE 2 12/04/2017   CRP 1.4 (H) 04/21/2018   CRP 6.8 (H) 04/18/2018   CRP 3.1 12/04/2017   REPTSTATUS 04/27/2018 FINAL 04/22/2018   GRAMSTAIN  04/22/2018    MODERATE WBC PRESENT,BOTH PMN AND MONONUCLEAR RARE GRAM VARIABLE ROD    CULT  04/22/2018    RARE STAPHYLOCOCCUS AUREUS NO ANAEROBES ISOLATED Performed at [facility] Lab, [address] [place], [place] [address]    [facility] STAPHYLOCOCCUS AUREUS 04/22/2018     Lab Results  Component Value Date   ALBUMIN 3.0 (L) 04/19/2018   ALBUMIN 3.3 (L) 04/18/2018   ALBUMIN 3.0 (L) 12/02/2014    Body mass index is 23.75 kg/m.  Orders:  No orders of the defined types were placed in this encounter.  No orders of the defined types were placed in this encounter.    Procedures: No procedures performed  Clinical Data: No additional findings.  ROS:  All other systems negative, except as noted in the HPI. Review of Systems  Objective: Vital Signs: Ht 6\' 1"  (1.854 m)   Wt 180 lb (81.6 kg)   BMI 23.75 kg/m   Specialty Comments:  No specialty comments available.  PMFS History: Patient Active Problem List   Diagnosis Date Noted  . H/O amputation of lesser toe, left (HCC) 05/06/2018  . Peripheral vascular disease (HCC) 04/19/2018  . Diabetic foot infection (HCC) 04/18/2018  . Tobacco abuse counseling   . Critical lower limb ischemia 05/02/2017  . Subacute osteomyelitis of right fibula (HCC)   . 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 Diabetes mellitus, insulin dependent (IDDM), controlled (HCC)   . Type 2 diabetes mellitus with diabetic neuropathy, with  long-term current use of insulin (HCC)   . S/P unilateral BKA (below knee amputation) (HCC)   . Rheumatoid arthritis (HCC)   . Gallstones 09/23/2013  . Non-intractable cyclical vomiting with nausea 09/23/2013  . Epidermal inclusion cyst 11/30/2012   Past Medical History:  Diagnosis Date  . Arthritis    "pretty much all over"  . Cellulitis of left foot   . COPD (chronic obstructive pulmonary disease) (HCC)   . Hyperlipidemia   . Hypertension   . Neuromuscular disorder (HCC)   . Neuropathy   . Osteomyelitis (HCC) 04/2018   4th toe left foot  . Osteomyelitis of fourth toe of left foot (HCC) 04/19/2018  . Type II diabetes mellitus (HCC)   . Wears glasses   . Wears partial dentures    top and bottom partials    Family History  Problem Relation Age of Onset  . Cirrhosis Father     Past Surgical History:  Procedure Laterality Date  . AMPUTATION Left 04/22/2018   Procedure: LEFT FOOT 4TH RAY AMPUTATION;  Surgeon: Nadara Mustarduda, Marcus V, MD;  [facility];  Service: Orthopedics;  Laterality: Left;  . BACK SURGERY    . CHOLECYSTECTOMY N/A 10/26/2013   Procedure: LAPAROSCOPIC CHOLECYSTECTOMY WITH INTRAOPERATIVE CHOLANGIOGRAM;  Surgeon: Clovis Puhomas A. Cornett, MD;  Location: [facility] OR;  Service: General;  Laterality: N/A;  . COLONOSCOPY    . CYST EXCISION Bilateral 02/15/2015   Procedure: LEFT INDEX FINGER AND RIGHT MIDDLE FINGER NODULE EXCISION;  Surgeon: Tarry KosNaiping M Xu, MD;  Location: [facility];  Service: Orthopedics;  Laterality: Bilateral;  . CYST EXCISION PERINEAL N/A 12/08/2012   Procedure: CYST EXCISION PERINeum;  Surgeon: Maisie Fushomas A. Cornett, MD;  Location:  SURGERY CENTER;  Service: General;  Laterality: N/A;  . FOOT AMPUTATION Right 2005  . I&D EXTREMITY Right 03/07/2017   Procedure: EXCISION FIBULAR HEAD RIGHT BELOW KNEE AMPUTATION;  Surgeon: Nadara Mustarduda, Marcus V, MD;  [facility];  Service: Orthopedics;  Laterality: Right;  . INCISE AND DRAIN ABCESS  12/02/2014   PERINEAL  ABSCESS  . INCISION AND DRAINAGE PERIRECTAL ABSCESS Left 12/02/2014   Procedure: IRRIGATION AND DEBRIDEMENT PERINEAL ABSCESS;  Surgeon: Abigail Miyamotoouglas Blackman, MD;  Location: [facility] OR;  Service: General;  Laterality: Left;  . LEG AMPUTATION BELOW KNEE Right 2005  . LOWER EXTREMITY ANGIOGRAPHY N/A 05/05/2017   Procedure: LOWER EXTREMITY ANGIOGRAPHY;  Surgeon: Runell GessBerry, Jonathan J, MD;  [facility] INV[facility] LAB;  Service: Cardiovascular;  Laterality: N/A;  . LUMBAR DISC SURGERY  82,90   ruptured disc  . PERIPHERAL VASCULAR INTERVENTION Right 05/05/2017   Procedure: PERIPHERAL VASCULAR INTERVENTION;  Surgeon: Runell GessBerry, Jonathan J, MD;  [facility];  Service: Cardiovascular;  Laterality: Right;  . SCAR REVISION Right 2005   @ amputation  . TOE  AMPUTATION Right 2005   Social History   Occupational History  . Not on file  Tobacco Use  . Smoking status: Former Smoker    Packs/day: 1.00    Years: 47.00    Pack years: 47.00    Types: Cigarettes  . Smokeless tobacco: Never Used  Substance and Sexual Activity  . Alcohol use: No  . Drug use: No  . Sexual activity: Never

## 2018-05-21 ENCOUNTER — Ambulatory Visit (INDEPENDENT_AMBULATORY_CARE_PROVIDER_SITE_OTHER): Payer: Medicare (Managed Care) | Admitting: Orthopedic Surgery

## 2018-05-21 ENCOUNTER — Other Ambulatory Visit: Payer: Self-pay

## 2018-05-21 ENCOUNTER — Encounter (INDEPENDENT_AMBULATORY_CARE_PROVIDER_SITE_OTHER): Payer: Self-pay | Admitting: Orthopedic Surgery

## 2018-05-21 VITALS — Ht 73.0 in | Wt 180.0 lb

## 2018-05-21 DIAGNOSIS — Z89422 Acquired absence of other left toe(s): Secondary | ICD-10-CM

## 2018-05-21 DIAGNOSIS — Z89511 Acquired absence of right leg below knee: Secondary | ICD-10-CM

## 2018-05-21 DIAGNOSIS — E1140 Type 2 diabetes mellitus with diabetic neuropathy, unspecified: Secondary | ICD-10-CM

## 2018-05-21 DIAGNOSIS — Z794 Long term (current) use of insulin: Secondary | ICD-10-CM

## 2018-05-22 ENCOUNTER — Encounter (HOSPITAL_COMMUNITY): Payer: Medicare (Managed Care)

## 2018-05-25 ENCOUNTER — Encounter (INDEPENDENT_AMBULATORY_CARE_PROVIDER_SITE_OTHER): Payer: Self-pay | Admitting: Orthopedic Surgery

## 2018-05-25 NOTE — Progress Notes (Signed)
Office Visit Note   Patient: Brian Zavala           Date of Birth: Nov 06, 1950           MRN: [id number] Visit Date: 05/21/2018              Requested by: [employer], [place] [facility] [place] [address] [place], [place] [address] PCP: Inc, [facility]  Chief Complaint  Patient presents with  . Left Foot - Routine Post Op    04/22/2018 left foot 4th ray amputation       HPI: Patient is a 68 year old gentleman who presents status post left foot fourth ray amputation.  He is about 4 weeks out.  He is currently wearing the medical compression stocking he is nonweightbearing.  He states he does have a slight amount of swelling and drainage.  Assessment & Plan: Visit Diagnoses:  1. H/O amputation of lesser toe, left (HCC)   2. History of right below knee amputation (HCC)   3. Type 2 diabetes mellitus with diabetic neuropathy, with long-term current use of insulin (HCC)     Plan: Patient has maceration in the forefoot he is given pieces of the sock to apply to the webspace to absorb the drainage he will continue with a compression sock continue with elevation.  Recommend a size large sock he is currently using an extra-large sock.  Follow-Up Instructions: Return in about 4 weeks (around 06/18/2018).   Ortho Exam  Patient is alert, oriented, no adenopathy, well-dressed, normal affect, normal respiratory effort. Examination patient does have maceration distally at the fourth ray amputation site.  There is no cellulitis no odor there is gaping of the wound but no depth no exposed bone or tendon.  Imaging: No results found. No images are attached to the encounter.  Labs: Lab Results  Component Value Date   HGBA1C 8.0 (H) 04/19/2018   HGBA1C 7.6 (H) 03/07/2017   HGBA1C 9.4 (H) 12/03/2014   ESRSEDRATE 46 (H) 04/18/2018   ESRSEDRATE 2 12/04/2017   CRP 1.4 (H) 04/21/2018   CRP 6.8 (H) 04/18/2018   CRP 3.1 12/04/2017   REPTSTATUS  04/27/2018 FINAL 04/22/2018   GRAMSTAIN  04/22/2018    MODERATE WBC PRESENT,BOTH PMN AND MONONUCLEAR RARE GRAM VARIABLE ROD    CULT  04/22/2018    RARE STAPHYLOCOCCUS AUREUS NO ANAEROBES ISOLATED Performed at [facility], [address], [place], [place] [address]    [facility] STAPHYLOCOCCUS AUREUS 04/22/2018     Lab Results  Component Value Date   ALBUMIN 3.0 (L) 04/19/2018   ALBUMIN 3.3 (L) 04/18/2018   ALBUMIN 3.0 (L) 12/02/2014    Body mass index is 23.75 kg/m.  Orders:  No orders of the defined types were placed in this encounter.  No orders of the defined types were placed in this encounter.    Procedures: No procedures performed  Clinical Data: No additional findings.  ROS:  All other systems negative, except as noted in the HPI. Review of Systems  Objective: Vital Signs: Ht 6\' 1"  (1.854 m)   Wt 180 lb (81.6 kg)   BMI 23.75 kg/m   Specialty Comments:  No specialty comments available.  PMFS History: Patient Active Problem List   Diagnosis Date Noted  . H/O amputation of lesser toe, left (HCC) 05/06/2018  . Peripheral vascular disease (HCC) 04/19/2018  . Diabetic foot infection (HCC) 04/18/2018  . Tobacco abuse counseling   . Critical lower limb  ischemia 05/02/2017  . Subacute osteomyelitis of right fibula (HCC)   . 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 Diabetes mellitus, insulin dependent (IDDM), controlled (HCC)   . Type 2 diabetes mellitus with diabetic neuropathy, with long-term current use of insulin (HCC)   . S/P unilateral BKA (below knee amputation) (HCC)   . Rheumatoid arthritis (HCC)   . Gallstones 09/23/2013  . Non-intractable cyclical vomiting with nausea 09/23/2013  . Epidermal inclusion cyst 11/30/2012   Past Medical History:  Diagnosis Date  . Arthritis    "pretty much all over"  . Cellulitis of left foot   . COPD (chronic obstructive pulmonary disease) (HCC)   .  Hyperlipidemia   . Hypertension   . Neuromuscular disorder (HCC)   . Neuropathy   . Osteomyelitis (HCC) 04/2018   4th toe left foot  . Osteomyelitis of fourth toe of left foot (HCC) 04/19/2018  . Type II diabetes mellitus (HCC)   . Wears glasses   . Wears partial dentures    top and bottom partials    Family History  Problem Relation Age of Onset  . Cirrhosis Father     Past Surgical History:  Procedure Laterality Date  . AMPUTATION Left 04/22/2018   Procedure: LEFT FOOT 4TH RAY AMPUTATION;  Surgeon: [name], MD;  Location: [facility] OR;  Service: Orthopedics;  Laterality: Left;  . BACK SURGERY    . CHOLECYSTECTOMY N/A 10/26/2013   Procedure: LAPAROSCOPIC CHOLECYSTECTOMY WITH INTRAOPERATIVE CHOLANGIOGRAM;  Surgeon: Clovis Pu. Cornett, MD;  [facility];  Service: General;  Laterality: N/A;  . COLONOSCOPY    . CYST EXCISION Bilateral 02/15/2015   Procedure: LEFT INDEX FINGER AND RIGHT MIDDLE FINGER NODULE EXCISION;  Surgeon: Tarry Kos, MD;  Location: [facility];  Service: Orthopedics;  Laterality: Bilateral;  . CYST EXCISION PERINEAL N/A 12/08/2012   Procedure: CYST EXCISION PERINeum;  Surgeon: Maisie Fus A. Cornett, MD;  Location: [facility];  Service: General;  Laterality: N/A;  . FOOT AMPUTATION Right 2005  . I&D EXTREMITY Right 03/07/2017   Procedure: EXCISION FIBULAR HEAD RIGHT BELOW KNEE AMPUTATION;  Surgeon: [name], MD;  [facility];  Service: Orthopedics;  Laterality: Right;  . INCISE AND DRAIN ABCESS  12/02/2014   PERINEAL ABSCESS  . INCISION AND DRAINAGE PERIRECTAL ABSCESS Left 12/02/2014   Procedure: IRRIGATION AND DEBRIDEMENT PERINEAL ABSCESS;  Surgeon: Abigail Miyamoto, MD;  [facility];  Service: General;  Laterality: Left;  . LEG AMPUTATION BELOW KNEE Right 2005  . LOWER EXTREMITY ANGIOGRAPHY N/A 05/05/2017   Procedure: LOWER EXTREMITY ANGIOGRAPHY;  Surgeon: [name], MD;  [facility] INV[facility] LAB;  Service:  Cardiovascular;  Laterality: N/A;  . LUMBAR DISC SURGERY  82,90   ruptured disc  . PERIPHERAL VASCULAR INTERVENTION Right 05/05/2017   Procedure: PERIPHERAL VASCULAR INTERVENTION;  Surgeon: [name], MD;  Location: [facility] INVASIVE CV LAB;  Service: Cardiovascular;  Laterality: Right;  . SCAR REVISION Right 2005   @ amputation  . TOE AMPUTATION Right 2005   Social History   Occupational History  . Not on file  Tobacco Use  . Smoking status: Former Smoker    Packs/day: 1.00    Years: 47.00    Pack years: 47.00    Types: Cigarettes  . Smokeless tobacco: Never Used  Substance and Sexual Activity  . Alcohol use: No  . Drug use: No  . Sexual activity: Never

## 2018-06-18 ENCOUNTER — Ambulatory Visit (INDEPENDENT_AMBULATORY_CARE_PROVIDER_SITE_OTHER): Payer: Medicare (Managed Care) | Admitting: Orthopedic Surgery

## 2018-06-18 ENCOUNTER — Other Ambulatory Visit: Payer: Self-pay

## 2018-06-18 ENCOUNTER — Encounter (HOSPITAL_COMMUNITY): Payer: Medicare (Managed Care)

## 2018-06-18 ENCOUNTER — Encounter (INDEPENDENT_AMBULATORY_CARE_PROVIDER_SITE_OTHER): Payer: Self-pay | Admitting: Orthopedic Surgery

## 2018-06-18 VITALS — Ht 73.0 in | Wt 180.0 lb

## 2018-06-18 DIAGNOSIS — Z89511 Acquired absence of right leg below knee: Secondary | ICD-10-CM

## 2018-06-18 DIAGNOSIS — Z89422 Acquired absence of other left toe(s): Secondary | ICD-10-CM

## 2018-06-18 NOTE — Progress Notes (Signed)
Office Visit Note   Patient: Brian Zavala           Date of Birth: [birth year]           MRN: [id number] Visit Date: 06/18/2018              Requested by: [employer], [place] [facility] [place] [address] [place], [place] [address] PCP: Inc, [place] [facility]  Chief Complaint  Patient presents with  . Left Foot - Routine Post Op    04/22/2018 left foot 4th ray      HPI: Patient is a 68 year old gentleman who presents in follow-up status post left foot fourth ray amputation.  Patient was using the medical compression stocking as well as packing the webspace open with pieces of sock to absorb the drainage.  Patient switched to an alginate dressing when he ran out of the socks and noticed increased maceration after using the alginate dressing.  Patient denies any pain or swelling states he has no drainage at this time.  Assessment & Plan: Visit Diagnoses:  1. History of right below knee amputation (HCC)   2. H/O amputation of lesser toe, left (HCC)     Plan: Patient was given sock material to apply in the webspace.  He will continue with the medical compression stocking.  Follow-Up Instructions: Return in about 4 weeks (around 07/16/2018).   Ortho Exam  Patient is alert, oriented, no adenopathy, well-dressed, normal affect, normal respiratory effort. Examination the wound is completely healed there is some mild maceration around the wound edges but there is no cellulitis no drainage no open wound no signs of infection.  Imaging: No results found. No images are attached to the encounter.  Labs: Lab Results  Component Value Date   HGBA1C 8.0 (H) 04/19/2018   HGBA1C 7.6 (H) 03/07/2017   HGBA1C 9.4 (H) 12/03/2014   ESRSEDRATE 46 (H) 04/18/2018   ESRSEDRATE 2 12/04/2017   CRP 1.4 (H) 04/21/2018   CRP 6.8 (H) 04/18/2018   CRP 3.1 12/04/2017   REPTSTATUS 04/27/2018 FINAL 04/22/2018   GRAMSTAIN  04/22/2018    MODERATE WBC PRESENT,BOTH PMN  AND MONONUCLEAR RARE GRAM VARIABLE ROD    CULT  04/22/2018    RARE STAPHYLOCOCCUS AUREUS NO ANAEROBES ISOLATED Performed at [facility], [address] [place], [place] [address]    [facility] STAPHYLOCOCCUS AUREUS 04/22/2018     Lab Results  Component Value Date   ALBUMIN 3.0 (L) 04/19/2018   ALBUMIN 3.3 (L) 04/18/2018   ALBUMIN 3.0 (L) 12/02/2014    Body mass index is 23.75 kg/m.  Orders:  No orders of the defined types were placed in this encounter.  No orders of the defined types were placed in this encounter.    Procedures: No procedures performed  Clinical Data: No additional findings.  ROS:  All other systems negative, except as noted in the HPI. Review of Systems  Objective: Vital Signs: Ht 6\' 1"  (1.854 m)   Wt 180 lb (81.6 kg)   BMI 23.75 kg/m   Specialty Comments:  No specialty comments available.  PMFS History: Patient Active Problem List   Diagnosis Date Noted  . H/O amputation of lesser toe, left (HCC) 05/06/2018  . Peripheral vascular disease (HCC) 04/19/2018  . Diabetic foot infection (HCC) 04/18/2018  . Tobacco abuse counseling   . Critical lower limb ischemia 05/02/2017  . Subacute osteomyelitis of right fibula (HCC)   . 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 Diabetes mellitus, insulin dependent (IDDM), controlled (HCC)   . Type 2 diabetes mellitus with diabetic neuropathy, with long-term current use of insulin (HCC)   . S/P unilateral BKA (below knee amputation) (HCC)   . Rheumatoid arthritis (HCC)   . Gallstones 09/23/2013  . Non-intractable cyclical vomiting with nausea 09/23/2013  . Epidermal inclusion cyst 11/30/2012   Past Medical History:  Diagnosis Date  . Arthritis    "pretty much all over"  . Cellulitis of left foot   . COPD (chronic obstructive pulmonary disease) (HCC)   . Hyperlipidemia   . Hypertension   . Neuromuscular disorder (HCC)   . Neuropathy   .  Osteomyelitis (HCC) 04/2018   4th toe left foot  . Osteomyelitis of fourth toe of left foot (HCC) 04/19/2018  . Type II diabetes mellitus (HCC)   . Wears glasses   . Wears partial dentures    top and bottom partials    Family History  Problem Relation Age of Onset  . Cirrhosis Father     Past Surgical History:  Procedure Laterality Date  . AMPUTATION Left 04/22/2018   Procedure: LEFT FOOT 4TH RAY AMPUTATION;  Surgeon: [name], MD;  Location: [facility] OR;  Service: Orthopedics;  Laterality: Left;  . BACK SURGERY    . CHOLECYSTECTOMY N/A 10/26/2013   Procedure: LAPAROSCOPIC CHOLECYSTECTOMY WITH INTRAOPERATIVE CHOLANGIOGRAM;  Surgeon: Clovis Pu. Cornett, MD;  Location: [facility] OR;  Service: General;  Laterality: N/A;  . COLONOSCOPY    . CYST EXCISION Bilateral 02/15/2015   Procedure: LEFT INDEX FINGER AND RIGHT MIDDLE FINGER NODULE EXCISION;  Surgeon: Tarry Kos, MD;  Location: [facility];  Service: Orthopedics;  Laterality: Bilateral;  . CYST EXCISION PERINEAL N/A 12/08/2012   Procedure: CYST EXCISION PERINeum;  Surgeon: Maisie Fus A. Cornett, MD;  Location: [facility];  Service: General;  Laterality: N/A;  . FOOT AMPUTATION Right 2005  . I&D EXTREMITY Right 03/07/2017   Procedure: EXCISION FIBULAR HEAD RIGHT BELOW KNEE AMPUTATION;  Surgeon: [name], MD;  Location: [facility] OR;  Service: Orthopedics;  Laterality: Right;  . INCISE AND DRAIN ABCESS  12/02/2014   PERINEAL ABSCESS  . INCISION AND DRAINAGE PERIRECTAL ABSCESS Left 12/02/2014   Procedure: IRRIGATION AND DEBRIDEMENT PERINEAL ABSCESS;  Surgeon: Abigail Miyamoto, MD;  Location: [facility] OR;  Service: General;  Laterality: Left;  . LEG AMPUTATION BELOW KNEE Right 2005  . LOWER EXTREMITY ANGIOGRAPHY N/A 05/05/2017   Procedure: LOWER EXTREMITY ANGIOGRAPHY;  Surgeon: [name], MD;  Location: [facility] INV[facility] LAB;  Service: Cardiovascular;  Laterality: N/A;  . LUMBAR DISC SURGERY  82,90   ruptured disc  . PERIPHERAL  VASCULAR INTERVENTION Right 05/05/2017   Procedure: PERIPHERAL VASCULAR INTERVENTION;  Surgeon: [name], MD;  Location: [facility] INVASIVE CV LAB;  Service: Cardiovascular;  Laterality: Right;  . SCAR REVISION Right 2005   @ amputation  . TOE AMPUTATION Right 2005   Social History   Occupational History  . Not on file  Tobacco Use  . Smoking status: Former Smoker    Packs/day: 1.00    Years: 47.00    Pack years: 47.00    Types: Cigarettes  . Smokeless tobacco: Never Used  Substance and Sexual Activity  . Alcohol use: No  . Drug use: No  . Sexual activity: Never

## 2018-06-22 ENCOUNTER — Encounter (HOSPITAL_COMMUNITY): Payer: Medicare (Managed Care)

## 2018-07-06 ENCOUNTER — Other Ambulatory Visit: Payer: Self-pay | Admitting: Cardiovascular Disease

## 2018-07-06 DIAGNOSIS — I739 Peripheral vascular disease, unspecified: Secondary | ICD-10-CM

## 2018-07-16 ENCOUNTER — Ambulatory Visit: Payer: Self-pay | Admitting: Orthopedic Surgery

## 2018-08-03 ENCOUNTER — Ambulatory Visit (HOSPITAL_COMMUNITY)
Admission: RE | Admit: 2018-08-03 | Discharge: 2018-08-03 | Disposition: A | Discharge status: Home / Self Care | Payer: Medicare (Managed Care) | Source: Ambulatory Visit | Attending: Cardiovascular Disease | Admitting: Cardiovascular Disease

## 2018-08-03 ENCOUNTER — Other Ambulatory Visit: Payer: Self-pay

## 2018-08-03 ENCOUNTER — Other Ambulatory Visit (HOSPITAL_COMMUNITY): Payer: Self-pay | Admitting: Cardiovascular Disease

## 2018-08-03 DIAGNOSIS — I6523 Occlusion and stenosis of bilateral carotid arteries: Secondary | ICD-10-CM | POA: Diagnosis present

## 2018-08-03 DIAGNOSIS — R0989 Other specified symptoms and signs involving the circulatory and respiratory systems: Secondary | ICD-10-CM | POA: Diagnosis present

## 2018-08-04 ENCOUNTER — Other Ambulatory Visit: Payer: Self-pay

## 2018-08-04 DIAGNOSIS — I6523 Occlusion and stenosis of bilateral carotid arteries: Secondary | ICD-10-CM

## 2018-08-04 NOTE — Progress Notes (Signed)
Notes recorded by [name], MD on 08/03/2018 at 1:47 PM EDT No change from prior study. Repeat in 12 months.

## 2018-08-17 ENCOUNTER — Other Ambulatory Visit: Payer: Self-pay

## 2018-08-17 ENCOUNTER — Ambulatory Visit (HOSPITAL_COMMUNITY)
Admission: RE | Admit: 2018-08-17 | Discharge: 2018-08-17 | Disposition: A | Discharge status: Home / Self Care | Payer: Medicare (Managed Care) | Source: Ambulatory Visit | Attending: Family | Admitting: Family

## 2018-08-17 ENCOUNTER — Other Ambulatory Visit (HOSPITAL_COMMUNITY): Payer: Self-pay | Admitting: Internal Medicine

## 2018-08-17 DIAGNOSIS — M7989 Other specified soft tissue disorders: Secondary | ICD-10-CM

## 2018-08-17 DIAGNOSIS — M79651 Pain in right thigh: Secondary | ICD-10-CM

## 2018-08-18 ENCOUNTER — Ambulatory Visit (INDEPENDENT_AMBULATORY_CARE_PROVIDER_SITE_OTHER): Payer: Medicare (Managed Care) | Admitting: Physician Assistant

## 2018-08-18 ENCOUNTER — Ambulatory Visit (INDEPENDENT_AMBULATORY_CARE_PROVIDER_SITE_OTHER): Payer: Medicare (Managed Care)

## 2018-08-18 ENCOUNTER — Other Ambulatory Visit: Payer: Self-pay

## 2018-08-18 ENCOUNTER — Encounter: Payer: Self-pay | Admitting: Orthopedic Surgery

## 2018-08-18 ENCOUNTER — Other Ambulatory Visit (HOSPITAL_COMMUNITY)
Admission: RE | Admit: 2018-08-18 | Discharge: 2018-08-18 | Disposition: A | Discharge status: Home / Self Care | Payer: Medicare (Managed Care) | Source: Ambulatory Visit | Attending: Orthopedic Surgery | Admitting: Orthopedic Surgery

## 2018-08-18 VITALS — Ht 73.0 in | Wt 180.0 lb

## 2018-08-18 DIAGNOSIS — E1140 Type 2 diabetes mellitus with diabetic neuropathy, unspecified: Secondary | ICD-10-CM

## 2018-08-18 DIAGNOSIS — T8781 Dehiscence of amputation stump: Secondary | ICD-10-CM

## 2018-08-18 DIAGNOSIS — I82431 Acute embolism and thrombosis of right popliteal vein: Secondary | ICD-10-CM | POA: Diagnosis not present

## 2018-08-18 DIAGNOSIS — Z794 Long term (current) use of insulin: Secondary | ICD-10-CM

## 2018-08-18 DIAGNOSIS — M1611 Unilateral primary osteoarthritis, right hip: Secondary | ICD-10-CM

## 2018-08-18 DIAGNOSIS — Z1159 Encounter for screening for other viral diseases: Secondary | ICD-10-CM | POA: Insufficient documentation

## 2018-08-18 DIAGNOSIS — M86461 Chronic osteomyelitis with draining sinus, right tibia and fibula: Secondary | ICD-10-CM | POA: Diagnosis not present

## 2018-08-18 DIAGNOSIS — E440 Moderate protein-calorie malnutrition: Secondary | ICD-10-CM

## 2018-08-18 DIAGNOSIS — Z89511 Acquired absence of right leg below knee: Secondary | ICD-10-CM

## 2018-08-18 NOTE — Progress Notes (Signed)
Office Visit Note   Patient: Brian Zavala           Date of Birth: [birth year]           MRN: [id number] Visit Date: 08/18/2018              Requested by: [employer], [place] [facility] [place] [address] [place],  [place] [address] PCP: Inc, [place] [facility]  Chief Complaint  Patient presents with   Left Foot - Routine Post Op    Left foot 4th ray amp      HPI: The patient is a 68 yo gentleman who is seen for drainage, swelling and pain over his right transtibial amputation. He underwent a right transtibial amputation in 2005 and then was revised in 03/2017 with excision of a portion of the fibular head at that time. He uses a motorized chair for ambulation and had not been using a prosthesis following the last surgery. He has worked with [employer] [facility] in the past.   He noticed swelling and pain over the right transtibial amputation last week and then developed drainage laterally over the past 2 days. He was started on Keflex by his PCP and was also started on Xarelto after a Venous US showed DVT of the popliteal and distal femoral vein.   Assessment & Plan: Visit Diagnoses:  1. History of right below knee amputation (HCC)   2. 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 Deep vein thrombosis (DVT) of popliteal vein of right lower extremity, unspecified chronicity (HCC)   4. Type 2 diabetes mellitus with diabetic neuropathy, with long-term current use of insulin (HCC)   5. Unilateral primary osteoarthritis, right hip   6. Moderate protein malnutrition (HCC)     Plan: Will plan for revision of right transtibial amputation with excision of fibular head for suspected osteomyelitis later this week.  Okay to continue Keflex pre op and Xarelto per Dr. Lajoyce Corners.  Will plan follow up in the office next week following surgery later this week.   Follow-Up Instructions: Return in about 10 days (around 08/28/2018).   Ortho  Exam  Patient is alert, oriented, no adenopathy, well-dressed, normal affect, normal respiratory effort. The right transtibial amputation site has a draining wound over the fibular head area with slightly thick tan drainage.  He has tenderness to palpation over this area as well as tenderness over the posterior flap and the posterior right knee and thigh.  He has some erythema and considerable edema over the distal residual limb.  We will plan to proceed with revision right transtibial amputation later this week for suspected osteomyelitis. Imaging: Xr Tibia/fibula Right  Result Date: 08/18/2018 Radiographs of the right tibia show changes about the residual fibular head likely consistent with osteomyelitis, calcification of the popliteal and distal femoral vessels.   No images are attached to the encounter.  Labs: Lab Results  Component Value Date   HGBA1C 8.0 (H) 04/19/2018   HGBA1C 7.6 (H) 03/07/2017   HGBA1C 9.4 (H) 12/03/2014   ESRSEDRATE 46 (H) 04/18/2018   ESRSEDRATE 2 12/04/2017   CRP 1.4 (H) 04/21/2018   CRP 6.8 (H) 04/18/2018   CRP 3.1 12/04/2017   REPTSTATUS 04/27/2018 FINAL 04/22/2018   GRAMSTAIN  04/22/2018    MODERATE WBC PRESENT,BOTH PMN AND MONONUCLEAR RARE GRAM VARIABLE ROD    CULT  04/22/2018    RARE STAPHYLOCOCCUS AUREUS NO ANAEROBES ISOLATED Performed at [facility],  [address], [place], [place] [address]    [facility] STAPHYLOCOCCUS AUREUS 04/22/2018     Lab Results  Component Value Date   ALBUMIN 3.0 (L) 04/19/2018   ALBUMIN 3.3 (L) 04/18/2018   ALBUMIN 3.0 (L) 12/02/2014    Body mass index is 23.75 kg/m.  Orders:  Orders Placed This Encounter  Procedures   XR Tibia/Fibula Right   No orders of the defined types were placed in this encounter.    Procedures: No procedures performed  Clinical Data: No additional findings.  ROS:  All other systems negative, except as noted in the HPI. Review of Systems  Objective: Vital  Signs: Ht 6\' 1"  (1.854 m)    Wt 180 lb (81.6 kg)    BMI 23.75 kg/m   Specialty Comments:  No specialty comments available.  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, right, sequela (HCC) 02/03/2017   Diabetes mellitus, insulin dependent (IDDM), controlled (HCC)    Type 2 diabetes mellitus with diabetic neuropathy, with long-term current use of insulin (HCC)    S/P unilateral BKA (below knee amputation) (HCC)    Rheumatoid arthritis (HCC)    Gallstones 09/23/2013   Non-intractable cyclical vomiting with nausea 09/23/2013   Epidermal inclusion cyst 11/30/2012   Past Medical History:  Diagnosis Date   Arthritis    "pretty much all over"   Cellulitis of left foot    COPD (chronic obstructive pulmonary disease) (HCC)    Hyperlipidemia    Hypertension    Neuromuscular disorder (HCC)    Neuropathy    Osteomyelitis (HCC) 04/2018   4th toe left foot   Osteomyelitis of fourth toe of left foot (HCC) 04/19/2018   Type II diabetes mellitus (HCC)    Wears glasses    Wears partial dentures    top and bottom partials    Family History  Problem Relation Age of Onset   Cirrhosis Father     Past Surgical History:  Procedure Laterality Date   AMPUTATION Left 04/22/2018   Procedure: LEFT FOOT 4TH RAY AMPUTATION;  Surgeon: [name], MD;  Location: [facility] OR;  Service: Orthopedics;  Laterality: Left;   BACK SURGERY     CHOLECYSTECTOMY N/A 10/26/2013   Procedure: LAPAROSCOPIC CHOLECYSTECTOMY WITH INTRAOPERATIVE  CHOLANGIOGRAM;  Surgeon: Clovis Pu. Cornett, MD;  Location: [facility] OR;  Service: General;  Laterality: N/A;   COLONOSCOPY     CYST EXCISION Bilateral 02/15/2015   Procedure: LEFT INDEX FINGER AND RIGHT MIDDLE FINGER NODULE EXCISION;  Surgeon: Tarry Kos, MD;  Location: [facility];  Service: Orthopedics;  Laterality: Bilateral;   CYST EXCISION PERINEAL N/A 12/08/2012   Procedure: CYST EXCISION PERINeum;  Surgeon: Maisie Fus A. Cornett, MD;  Location: [facility];  Service: General;  Laterality: N/A;   FOOT AMPUTATION Right 2005   I&D EXTREMITY Right 03/07/2017   Procedure: EXCISION FIBULAR HEAD RIGHT BELOW KNEE AMPUTATION;  Surgeon: [name], MD;  Location: [facility] OR;  Service: Orthopedics;  Laterality: Right;   INCISE AND DRAIN ABCESS  12/02/2014  PERINEAL ABSCESS   INCISION AND DRAINAGE PERIRECTAL ABSCESS Left 12/02/2014   Procedure: IRRIGATION AND DEBRIDEMENT PERINEAL ABSCESS;  Surgeon: Coralie Keens, MD;  Location: [facility];  Service: General;  Laterality: Left;   LEG AMPUTATION BELOW KNEE Right 2005   LOWER EXTREMITY ANGIOGRAPHY N/A 05/05/2017   Procedure: LOWER EXTREMITY ANGIOGRAPHY;  Surgeon: Lorretta Harp, MD;  Location: [place] [facility];  Service: Cardiovascular;  Laterality: N/A;   LUMBAR DISC SURGERY  82,90   ruptured disc   PERIPHERAL VASCULAR INTERVENTION Right 05/05/2017   Procedure: PERIPHERAL VASCULAR INTERVENTION;  Surgeon: Lorretta Harp, MD;  Location: [place] [facility];  Service: Cardiovascular;  Laterality: Right;   SCAR REVISION Right 2005   @ amputation   TOE AMPUTATION Right 2005   Social History   Occupational History   Not on file  Tobacco Use   Smoking status: Former Smoker    Packs/day: 1.00    Years: 47.00    Pack years: 47.00    Types: Cigarettes   Smokeless tobacco: Never Used  Substance and Sexual Activity   Alcohol use: No   Drug use: No   Sexual activity: Never

## 2018-08-19 ENCOUNTER — Encounter (HOSPITAL_COMMUNITY): Payer: Self-pay | Admitting: *Deleted

## 2018-08-19 ENCOUNTER — Other Ambulatory Visit: Payer: Self-pay

## 2018-08-19 LAB — NOVEL CORONAVIRUS, NAA (HOSP ORDER, SEND-OUT TO REF LAB; TAT 18-24 HRS): SARS-CoV-2, NAA: NOT DETECTED

## 2018-08-19 NOTE — Progress Notes (Signed)
Patient informed of the Benjamin that is currently in effect.  Patient verbalized understanding.  Patient denies shortness of breath, fever, cough and chest pain.  PCP -  Dr Bradd Burner Cardiologist - Denies  Chest x-ray - Denies EKG - 04/22/18 Stress Test - Denies ECHO - 02/19/08 Cardiac Cath - Denies  Sleep Study - Denies CPAP - N/A  Fasting Blood Sugar - 100 Checks Blood Sugar __1_ times a day  WHAT DO I DO ABOUT MY DIABETES MEDICATION?  Marland Kitchen Do not take any diabetes medication on the morning of surgery.  . THE NIGHT BEFORE SURGERY, take ______70_____ units of ____Toujeo_______insulin.  If your blood sugar is less than 70 mg/dL, you will need to treat for low blood sugar: o Do not take insulin. o Treat a low blood sugar (less than 70 mg/dL) with  cup of clear juice (cranberry or apple) o Recheck blood sugar in 15 minutes after treatment (to make sure it is greater than 70 mg/dL). If your blood sugar is not greater than 70 mg/dL on recheck, call [phone] for further instructions. . Report your blood sugar to the short stay nurse when you get to Short Stay.  Blood Thinner Instructions: Patient to call MD to get surgical instructions for Eliquis and Plavix prior to surgery.  Patient verbalized understanding and agreed to call MD for instructions.  ERAS No drink given by MD.  Clears til 5:30 am  Aspirin Instructions: Last dose 08/18/18 per MD  Anesthesia review: yes  STOP now taking any Aspirin (unless otherwise instructed by your surgeon), Aleve, Naproxen, Ibuprofen, Motrin, Advil, Goody's, BC's, all herbal medications, fish oil, and all vitamins.   Coronavirus Screening Have you or your sister/mother experienced the following symptoms:  Cough yes/no: No Fever (>100.19F)  yes/no: No Runny nose yes/no: No Sore throat yes/no: No Difficulty breathing/shortness of breath  yes/no: No  Have you or your sister/mother traveled in the last 14 days and  where? yes/no: No

## 2018-08-20 ENCOUNTER — Other Ambulatory Visit: Payer: Self-pay

## 2018-08-20 NOTE — Progress Notes (Signed)
Message on voicemail this AM from Darrelyn Hillock, nurse at [facility]. On the message she wanted someone to call and go over instructions with her because pt called and was confused about what he needed to do. I returned her call this AM and she states pt is fine now, he did write down everything during his pre-op call. She states he got nervous afterwards and questioned knowing what he needed to know. I did ask her if he mentioned what he was doing with his Eliquis and Plavix. She states he said he was stopping both today. Last dose was yesterday for both.

## 2018-08-21 ENCOUNTER — Ambulatory Visit (HOSPITAL_COMMUNITY): Payer: Medicare (Managed Care) | Admitting: Physician Assistant

## 2018-08-21 ENCOUNTER — Other Ambulatory Visit: Payer: Self-pay

## 2018-08-21 ENCOUNTER — Encounter (HOSPITAL_COMMUNITY): Payer: Self-pay | Admitting: *Deleted

## 2018-08-21 ENCOUNTER — Ambulatory Visit (HOSPITAL_COMMUNITY)
Admission: RE | Admit: 2018-08-21 | Discharge: 2018-08-21 | Disposition: A | Discharge status: Home / Self Care | Payer: Medicare (Managed Care) | Attending: Orthopedic Surgery | Admitting: Orthopedic Surgery

## 2018-08-21 ENCOUNTER — Encounter (HOSPITAL_COMMUNITY)
Admission: RE | Disposition: A | Discharge status: Home / Self Care | Payer: Self-pay | Source: Home / Self Care | Attending: Orthopedic Surgery

## 2018-08-21 DIAGNOSIS — E785 Hyperlipidemia, unspecified: Secondary | ICD-10-CM | POA: Diagnosis not present

## 2018-08-21 DIAGNOSIS — M868X6 Other osteomyelitis, lower leg: Secondary | ICD-10-CM | POA: Diagnosis not present

## 2018-08-21 DIAGNOSIS — T8781 Dehiscence of amputation stump: Secondary | ICD-10-CM

## 2018-08-21 DIAGNOSIS — E1151 Type 2 diabetes mellitus with diabetic peripheral angiopathy without gangrene: Secondary | ICD-10-CM | POA: Diagnosis not present

## 2018-08-21 DIAGNOSIS — I10 Essential (primary) hypertension: Secondary | ICD-10-CM | POA: Diagnosis not present

## 2018-08-21 DIAGNOSIS — Z7901 Long term (current) use of anticoagulants: Secondary | ICD-10-CM | POA: Insufficient documentation

## 2018-08-21 DIAGNOSIS — M86261 Subacute osteomyelitis, right tibia and fibula: Secondary | ICD-10-CM

## 2018-08-21 DIAGNOSIS — Z7982 Long term (current) use of aspirin: Secondary | ICD-10-CM | POA: Diagnosis not present

## 2018-08-21 DIAGNOSIS — Z79899 Other long term (current) drug therapy: Secondary | ICD-10-CM | POA: Insufficient documentation

## 2018-08-21 DIAGNOSIS — L97114 Non-pressure chronic ulcer of right thigh with necrosis of bone: Secondary | ICD-10-CM

## 2018-08-21 DIAGNOSIS — E1169 Type 2 diabetes mellitus with other specified complication: Secondary | ICD-10-CM | POA: Diagnosis not present

## 2018-08-21 DIAGNOSIS — E1140 Type 2 diabetes mellitus with diabetic neuropathy, unspecified: Secondary | ICD-10-CM | POA: Diagnosis not present

## 2018-08-21 DIAGNOSIS — Z794 Long term (current) use of insulin: Secondary | ICD-10-CM | POA: Diagnosis not present

## 2018-08-21 DIAGNOSIS — T8789 Other complications of amputation stump: Secondary | ICD-10-CM

## 2018-08-21 DIAGNOSIS — Z87891 Personal history of nicotine dependence: Secondary | ICD-10-CM | POA: Insufficient documentation

## 2018-08-21 DIAGNOSIS — J449 Chronic obstructive pulmonary disease, unspecified: Secondary | ICD-10-CM | POA: Insufficient documentation

## 2018-08-21 DIAGNOSIS — Z89511 Acquired absence of right leg below knee: Secondary | ICD-10-CM | POA: Insufficient documentation

## 2018-08-21 DIAGNOSIS — M1990 Unspecified osteoarthritis, unspecified site: Secondary | ICD-10-CM | POA: Insufficient documentation

## 2018-08-21 HISTORY — DX: Peripheral vascular disease, unspecified: I73.9

## 2018-08-21 HISTORY — PX: STUMP REVISION: SHX6102

## 2018-08-21 LAB — SURGICAL PCR SCREEN
MRSA, PCR: NEGATIVE
Staphylococcus aureus: NEGATIVE

## 2018-08-21 LAB — HEMOGLOBIN A1C
Hgb A1c MFr Bld: 7.6 % — ABNORMAL HIGH (ref 4.8–5.6)
Mean Plasma Glucose: 171.42 mg/dL

## 2018-08-21 LAB — BASIC METABOLIC PANEL
Anion gap: 10 (ref 5–15)
BUN: 10 mg/dL (ref 8–23)
CO2: 26 mmol/L (ref 22–32)
Calcium: 8.6 mg/dL — ABNORMAL LOW (ref 8.9–10.3)
Chloride: 100 mmol/L (ref 98–111)
Creatinine, Ser: 0.81 mg/dL (ref 0.61–1.24)
GFR calc Af Amer: >60 mL/min (ref >60)
GFR calc non Af Amer: >60 mL/min (ref >60)
Glucose, Bld: 201 mg/dL — ABNORMAL HIGH (ref 70–99)
Potassium: 3.9 mmol/L (ref 3.5–5.1)
Sodium: 136 mmol/L (ref 135–145)

## 2018-08-21 LAB — CBC
HCT: 42.5 % (ref 39.0–52.0)
Hemoglobin: 13.8 g/dL (ref 13.0–17.0)
MCH: 27.9 pg (ref 26.0–34.0)
MCHC: 32.5 g/dL (ref 30.0–36.0)
MCV: 85.9 fL (ref 80.0–100.0)
Platelets: 303 10*3/uL (ref 150–400)
RBC: 4.95 MIL/uL (ref 4.22–5.81)
RDW: 14.2 % (ref 11.5–15.5)
WBC: 8 10*3/uL (ref 4.0–10.5)
nRBC: 0 % (ref 0.0–0.2)

## 2018-08-21 LAB — PROTIME-INR
INR: 1.1 (ref 0.8–1.2)
Prothrombin Time: 14.2 seconds (ref 11.4–15.2)

## 2018-08-21 LAB — GLUCOSE, CAPILLARY
Glucose-Capillary: 192 mg/dL — ABNORMAL HIGH (ref 70–99)
Glucose-Capillary: 177 mg/dL — ABNORMAL HIGH (ref 70–99)

## 2018-08-21 SURGERY — REVISION, AMPUTATION SITE
Anesthesia: General | Site: Leg Lower | Laterality: Right

## 2018-08-21 MED ORDER — PROPOFOL 10 MG/ML IV BOLUS
INTRAVENOUS | Status: DC | PRN
  Administered 2018-08-21: 135 mg via INTRAVENOUS

## 2018-08-21 MED ORDER — LACTATED RINGERS IV SOLN
INTRAVENOUS | Status: DC
  Administered 2018-08-21: 08:00:00 via INTRAVENOUS

## 2018-08-21 MED ORDER — MIDAZOLAM HCL 2 MG/2ML IJ SOLN
INTRAMUSCULAR | Status: AC
  Filled 2018-08-21: qty 2

## 2018-08-21 MED ORDER — FENTANYL CITRATE (PF) 100 MCG/2ML IJ SOLN
INTRAMUSCULAR | Status: AC
  Administered 2018-08-21: 50 ug via INTRAVENOUS
  Filled 2018-08-21: qty 2

## 2018-08-21 MED ORDER — FENTANYL CITRATE (PF) 100 MCG/2ML IJ SOLN
25.0000 ug | INTRAMUSCULAR | Status: DC | PRN
  Administered 2018-08-21: 09:00:00 50 ug via INTRAVENOUS
  Administered 2018-08-21 (×2): 25 ug via INTRAVENOUS

## 2018-08-21 MED ORDER — OXYCODONE-ACETAMINOPHEN 5-325 MG PO TABS: 1.0000 | ORAL_TABLET | Freq: Four times a day (QID) | ORAL | 0 refills | Status: DC | PRN

## 2018-08-21 MED ORDER — 0.9 % SODIUM CHLORIDE (POUR BTL) OPTIME
TOPICAL | Status: DC | PRN
  Administered 2018-08-21: 1000 mL

## 2018-08-21 MED ORDER — OXYCODONE-ACETAMINOPHEN 5-325 MG PO TABS
1.0000 | ORAL_TABLET | Freq: Once | ORAL | Status: AC
  Administered 2018-08-21: 10:00:00 1 via ORAL

## 2018-08-21 MED ORDER — CHLORHEXIDINE GLUCONATE 4 % EX LIQD: 60.0000 mL | Freq: Once | CUTANEOUS | Status: DC

## 2018-08-21 MED ORDER — CEFAZOLIN SODIUM-DEXTROSE 2-4 GM/100ML-% IV SOLN
2.0000 g | INTRAVENOUS | Status: AC
  Administered 2018-08-21: 2 g via INTRAVENOUS
  Filled 2018-08-21: qty 100

## 2018-08-21 MED ORDER — FENTANYL CITRATE (PF) 250 MCG/5ML IJ SOLN
INTRAMUSCULAR | Status: AC
  Filled 2018-08-21: qty 5

## 2018-08-21 MED ORDER — POVIDONE-IODINE 10 % EX SWAB: 2.0000 "application " | Freq: Once | CUTANEOUS | Status: DC

## 2018-08-21 MED ORDER — LACTATED RINGERS IV SOLN
INTRAVENOUS | Status: DC | PRN
  Administered 2018-08-21: 08:00:00 via INTRAVENOUS

## 2018-08-21 MED ORDER — PHENYLEPHRINE HCL (PRESSORS) 10 MG/ML IV SOLN
INTRAVENOUS | Status: DC | PRN
  Administered 2018-08-21 (×3): 120 ug via INTRAVENOUS

## 2018-08-21 MED ORDER — SODIUM CHLORIDE 0.9 % IV SOLN
INTRAVENOUS | Status: DC | PRN
  Administered 2018-08-21: 50 ug/min via INTRAVENOUS

## 2018-08-21 MED ORDER — FENTANYL CITRATE (PF) 100 MCG/2ML IJ SOLN
INTRAMUSCULAR | Status: DC | PRN
  Administered 2018-08-21: 100 ug via INTRAVENOUS

## 2018-08-21 MED ORDER — OXYCODONE-ACETAMINOPHEN 5-325 MG PO TABS
ORAL_TABLET | ORAL | Status: AC
  Administered 2018-08-21: 1 via ORAL
  Filled 2018-08-21: qty 1

## 2018-08-21 MED ORDER — ACETAMINOPHEN 500 MG PO TABS: 1000.0000 mg | ORAL_TABLET | Freq: Once | ORAL | Status: DC

## 2018-08-21 MED ORDER — LIDOCAINE 2% (20 MG/ML) 5 ML SYRINGE
INTRAMUSCULAR | Status: DC | PRN
  Administered 2018-08-21: 100 mg via INTRAVENOUS

## 2018-08-21 MED ORDER — MIDAZOLAM HCL 5 MG/5ML IJ SOLN
INTRAMUSCULAR | Status: DC | PRN
  Administered 2018-08-21: 2 mg via INTRAVENOUS

## 2018-08-21 MED ORDER — ENSURE PRE-SURGERY PO LIQD: 296.0000 mL | Freq: Once | ORAL | Status: DC

## 2018-08-21 SURGICAL SUPPLY — 31 items
BLADE SAW RECIP 87.9 MT (BLADE) IMPLANT
BLADE SURG 21 STRL SS (BLADE) ×3 IMPLANT
CANISTER WOUND CARE 500ML ATS (WOUND CARE) ×3 IMPLANT
COVER SURGICAL LIGHT HANDLE (MISCELLANEOUS) ×3 IMPLANT
COVER WAND RF STERILE (DRAPES) ×3 IMPLANT
DRAPE EXTREMITY T 121X128X90 (DISPOSABLE) ×3 IMPLANT
DRAPE HALF SHEET 40X57 (DRAPES) ×3 IMPLANT
DRAPE INCISE IOBAN 66X45 STRL (DRAPES) ×3 IMPLANT
DRAPE U-SHAPE 47X51 STRL (DRAPES) ×3 IMPLANT
DRESSING PREVENA PLUS CUSTOM (GAUZE/BANDAGES/DRESSINGS) ×1 IMPLANT
DRSG PREVENA PLUS CUSTOM (GAUZE/BANDAGES/DRESSINGS) ×3
DURAPREP 26ML APPLICATOR (WOUND CARE) ×3 IMPLANT
ELECT REM PT RETURN 9FT ADLT (ELECTROSURGICAL) ×3
ELECTRODE REM PT RTRN 9FT ADLT (ELECTROSURGICAL) ×1 IMPLANT
GLOVE BIOGEL PI IND STRL 9 (GLOVE) ×1 IMPLANT
GLOVE BIOGEL PI INDICATOR 9 (GLOVE) ×2
GLOVE SURG ORTHO 9.0 STRL STRW (GLOVE) ×3 IMPLANT
GOWN STRL REUS W/ TWL XL LVL3 (GOWN DISPOSABLE) ×2 IMPLANT
GOWN STRL REUS W/TWL XL LVL3 (GOWN DISPOSABLE) ×4
KIT BASIN OR (CUSTOM PROCEDURE TRAY) ×3 IMPLANT
KIT TURNOVER KIT B (KITS) ×3 IMPLANT
MANIFOLD NEPTUNE II (INSTRUMENTS) ×3 IMPLANT
NS IRRIG 1000ML POUR BTL (IV SOLUTION) ×3 IMPLANT
PACK GENERAL/GYN (CUSTOM PROCEDURE TRAY) ×3 IMPLANT
PAD ARMBOARD 7.5X6 YLW CONV (MISCELLANEOUS) ×3 IMPLANT
PREVENA RESTOR ARTHOFORM 46X30 (CANNISTER) ×3 IMPLANT
STAPLER VISISTAT 35W (STAPLE) IMPLANT
SUT ETHILON 2 0 PSLX (SUTURE) ×6 IMPLANT
SUT SILK 2 0 (SUTURE)
SUT SILK 2-0 18XBRD TIE 12 (SUTURE) IMPLANT
TOWEL OR 17X26 10 PK STRL BLUE (TOWEL DISPOSABLE) ×3 IMPLANT

## 2018-08-21 NOTE — Anesthesia Preprocedure Evaluation (Signed)
Anesthesia Evaluation  Patient identified by MRN, date of birth, ID band Patient awake    Reviewed: Allergy & Precautions, NPO status , Patient's Chart, lab work & pertinent test results  Airway Mallampati: II  TM Distance: >3 FB Neck ROM: Full    Dental no notable dental hx. (+) Poor Dentition, Dental Advisory Given   Pulmonary COPD, former smoker,    Pulmonary exam normal breath sounds clear to auscultation       Cardiovascular hypertension, + Peripheral Vascular Disease  Normal cardiovascular exam Rhythm:Regular Rate:Normal     Neuro/Psych negative neurological ROS  negative psych ROS   GI/Hepatic negative GI ROS, Neg liver ROS,   Endo/Other  negative endocrine ROSdiabetes, Type 2, Insulin Dependent, Oral Hypoglycemic Agents  Renal/GU negative Renal ROS  negative genitourinary   Musculoskeletal  (+) Arthritis , Rheumatoid disorders,    Abdominal   Peds  Hematology  (+) Blood dyscrasia (on eliquis, plavix (last dose 6/17)), ,   Anesthesia Other Findings   Reproductive/Obstetrics                             Anesthesia Physical Anesthesia Plan  ASA: III  Anesthesia Plan: General   Post-op Pain Management:    Induction: Intravenous  PONV Risk Score and Plan: 2 and Ondansetron, Treatment may vary due to age or medical condition and Midazolam  Airway Management Planned: LMA  Additional Equipment:   Intra-op Plan:   Post-operative Plan: Extubation in OR  Informed Consent: I have reviewed the patients History and Physical, chart, labs and discussed the procedure including the risks, benefits and alternatives for the proposed anesthesia with the patient or authorized representative who has indicated his/her understanding and acceptance.     Dental advisory given  Plan Discussed with: CRNA  Anesthesia Plan Comments:         Anesthesia Quick Evaluation

## 2018-08-21 NOTE — H&P (Signed)
Brian Zavala is an 68 y.o. male.   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  He utilizes a motorized chair for ambulation and has not been wearing a prosthesis.  Over the past week he developed pain and swelling over the right transtibial amputation site as well as pain in his posterior thigh and knee.  He developed blistering and drainage over the right fibular head and radiographs over the area are consistent with osteomyelitis.  He was started on Keflex by his primary care.  He had venous ultrasound studies which showed a DVT of the right popliteal and distal femoral vein and was started on Xarelto by his primary care physician.   He presents today for revision of his right transtibial amputation with excision of the fibular head.  Past Medical History:  Diagnosis Date  . Arthritis    "pretty much all over"  . Cellulitis of left foot   . COPD (chronic obstructive pulmonary disease) (HCC)   . Hyperlipidemia   . Hypertension   . Neuromuscular disorder (HCC)    neuropathy legs  . Neuropathy    legs  . Osteomyelitis (HCC) 04/2018   4th toe left foot  . Osteomyelitis of fourth toe of left foot (HCC) 04/19/2018  . Peripheral vascular disease (HCC)   . Type II diabetes mellitus (HCC)   . Wears glasses   . Wears partial dentures    top and bottom partials    Past Surgical History:  Procedure Laterality Date  . AMPUTATION Left 04/22/2018   Procedure: LEFT FOOT 4TH RAY AMPUTATION;  Surgeon: [name], MD;  Location: [facility] OR;  Service: Orthopedics;  Laterality: Left;  . BACK SURGERY    . CHOLECYSTECTOMY N/A 10/26/2013   Procedure: LAPAROSCOPIC CHOLECYSTECTOMY WITH INTRAOPERATIVE CHOLANGIOGRAM;  Surgeon: Clovis Pu. Cornett, MD;  Location: [facility] OR;  Service: General;  Laterality: N/A;  . COLONOSCOPY     . CYST EXCISION Bilateral 02/15/2015   Procedure: LEFT INDEX FINGER AND RIGHT MIDDLE FINGER NODULE EXCISION;  Surgeon: Tarry Kos, MD;  Location: [facility];  Service: Orthopedics;  Laterality: Bilateral;  . CYST EXCISION PERINEAL N/A 12/08/2012   Procedure: CYST EXCISION PERINeum;  Surgeon: Maisie Fus A. Cornett, MD;  Location: [facility];  Service: General;  Laterality: N/A;  . FOOT AMPUTATION Right 2005  . I&D EXTREMITY Right 03/07/2017   Procedure: EXCISION FIBULAR HEAD RIGHT BELOW KNEE AMPUTATION;  Surgeon: [name], MD;  Location: [facility] OR;  Service: Orthopedics;  Laterality: Right;  . INCISE AND DRAIN ABCESS  12/02/2014   PERINEAL ABSCESS  . INCISION AND DRAINAGE PERIRECTAL ABSCESS Left 12/02/2014   Procedure: IRRIGATION AND DEBRIDEMENT PERINEAL ABSCESS;  Surgeon: Abigail Miyamoto, MD;  [facility] OR;  Service: General;  Laterality: Left;  . LEG AMPUTATION BELOW KNEE Right 2005  . LOWER EXTREMITY ANGIOGRAPHY N/A 05/05/2017   Procedure: LOWER EXTREMITY ANGIOGRAPHY;  Surgeon: [name], MD;  [facility];  Service: Cardiovascular;  Laterality: N/A;  . LUMBAR DISC SURGERY  82,90   ruptured disc  . PERIPHERAL VASCULAR INTERVENTION Right 05/05/2017   Procedure: PERIPHERAL VASCULAR INTERVENTION;  Surgeon: [name], MD;  Location: [facility] INVASIVE CV LAB;  Service: Cardiovascular;  Laterality: Right;  . SCAR REVISION Right 2005   @ amputation  . TOE AMPUTATION Right 2005  Family History  Problem Relation Age of Onset  . Cirrhosis Father    Social History:  reports that he quit smoking about 8 months ago. His smoking use included cigarettes. He has a 47.00 pack-year smoking history. He has never used smokeless tobacco. He reports that he does not drink alcohol or use drugs.  Allergies:  Allergies  Allergen Reactions  . Varenicline Other (See Comments)    Upset stomach    Medications Prior to Admission  Medication Sig  Dispense Refill  . apixaban (ELIQUIS) 5 MG TABS tablet Take 5 mg by mouth 2 (two) times daily.    Marland Kitchen aspirin 81 MG chewable tablet Chew 81 mg by mouth daily.    Marland Kitchen atorvastatin (LIPITOR) 80 MG tablet Take 1 tablet (80 mg total) by mouth daily at 6 PM. 30 tablet 2  . calcium carbonate (OSCAL) 1500 (600 Ca) MG TABS tablet Take 600 mg of elemental calcium by mouth daily.    . cephALEXin (KEFLEX) 500 MG capsule Take 500 mg by mouth 4 (four) times daily.    . cholecalciferol (VITAMIN D) 1000 UNITS tablet Take 1,000 Units by mouth daily.    . clopidogrel (PLAVIX) 75 MG tablet Take 1 tablet (75 mg total) by mouth daily with breakfast. 30 tablet 2  . docusate sodium (COLACE) 100 MG capsule Take 100 mg by mouth 2 (two) times daily.    Marland Kitchen etanercept (ENBREL) 50 MG/ML injection Inject 50 mg into the skin every Sunday.     . furosemide (LASIX) 20 MG tablet Take 20 mg by mouth daily.     . hydrochlorothiazide (MICROZIDE) 12.5 MG capsule Take 12.5 mg by mouth daily.    Marland Kitchen ibuprofen (ADVIL) 200 MG tablet Take 400 mg by mouth every 6 (six) hours as needed for headache or moderate pain.    . Insulin Glargine (TOUJEO SOLOSTAR [place]) Inject 140 Units into the skin every evening.     . leflunomide (ARAVA) 20 MG tablet Take 20 mg by mouth daily.    . Liraglutide (VICTOZA) 18 MG/3ML SOLN injection Inject 1.8 mg into the skin every evening.     . metFORMIN (GLUCOPHAGE) 1000 MG tablet Take 1,000 mg by mouth 2 (two) times daily.    Marland Kitchen morphine (MS CONTIN) 100 MG 12 hr tablet Take 100 mg by mouth every 12 (twelve) hours.    . predniSONE (DELTASONE) 1 MG tablet Take 2 mg by mouth daily with breakfast.    . pregabalin (LYRICA) 75 MG capsule Take 75-150 mg by mouth 2 (two) times daily. Take 75 mg in the morning and 150 mg in the evening    . vitamin B-12 (CYANOCOBALAMIN) 100 MCG tablet Take 100 mcg by mouth daily.    Marland Kitchen doxycycline (VIBRAMYCIN) 100 MG capsule Take 1 capsule (100 mg total) by mouth 2 (two) times daily. For 2 weeks.  (Patient not taking: Reported on 08/18/2018) 28 capsule 0  . oxyCODONE-acetaminophen (PERCOCET/ROXICET) 5-325 MG tablet Take 1 tablet by mouth every 4 (four) hours as needed for severe pain (break through pain). (Patient not taking: Reported on 08/18/2018) 30 tablet 0    Results for orders placed or performed during the hospital encounter of 08/21/18 (from the past 48 hour(s))  Glucose, capillary     Status: Abnormal   Collection Time: 08/21/18  6:31 AM  Result Value Ref Range   Glucose-Capillary 192 (H) 70 - 99 mg/dL  Hemoglobin A1c     Status: Abnormal   Collection Time: 08/21/18  7:15 AM  Result Value Ref Range   Hgb A1c MFr Bld 7.6 (H) 4.8 - 5.6 %    Comment: (NOTE) Pre diabetes:          5.7%-6.4% Diabetes:              >6.4% Glycemic control for   <7.0% adults with diabetes    Mean Plasma Glucose 171.42 mg/dL    Comment: Performed at [facility], [address] [place], [place] [address]   No results found.  Review of Systems  All other systems reviewed and are negative.   Blood pressure 130/77, pulse 88, temperature 97.7 F (36.5 C), temperature source Oral, resp. rate 20, height 6\' 1"  (1.854 m), weight 81.6 kg, SpO2 95 %. Physical Exam  Constitutional: He is oriented to person, place, and time. He appears well-developed and well-nourished. No distress.  Presents with a motorized wheelchair  Neck: No tracheal deviation present. No thyromegaly present.  Cardiovascular: Normal rate.  Respiratory: Effort normal. No stridor.  GI: Soft. He exhibits no distension.  Musculoskeletal:     Comments: The right transtibial amputation site has a draining wound over the fibular head area with slightly thick tan drainage.  He has tenderness to palpation over this area as well as tenderness over the posterior flap and the posterior right knee and thigh.  He has some erythema and considerable edema over the distal residual limb.  We will plan to proceed with revision right  transtibial amputation later this week for suspected osteomyelitis.  Neurological: He is alert and oriented to person, place, and time. No cranial nerve deficit.  Skin: Skin is warm.  Psychiatric: He has a normal mood and affect. His behavior is normal. Judgment and thought content normal.     Assessment/Plan Infection right transtibial amputation fibular head-plan revision right transtibial amputation with excision of fibular head for osteomyelitis-the procedure and possible benefits and risks including the risk of bleeding, infection, neurovascular injury, possible need for further surgery were discussed at length with the patient and his questions were answered to his satisfaction.  The patient wishes to proceed with surgery at this time.  Lazaro ArmsSHAWN , PA-C 08/21/2018, 7:36 AM  [facility] [phone]

## 2018-08-21 NOTE — Anesthesia Procedure Notes (Signed)
Procedure Name: LMA Insertion Date/Time: 08/21/2018 8:25 AM Performed by: Neldon Newport, CRNA Pre-anesthesia Checklist: Timeout performed, Patient being monitored, Suction available, Emergency Drugs available and Patient identified Patient Re-evaluated:Patient Re-evaluated prior to induction Oxygen Delivery Method: Circle system utilized Preoxygenation: Pre-oxygenation with 100% oxygen Induction Type: IV induction Ventilation: Mask ventilation without difficulty LMA: LMA inserted LMA Size: 5.0 Number of attempts: 1 Placement Confirmation: breath sounds checked- equal and bilateral and positive ETCO2 Tube secured with: Tape Dental Injury: Teeth and Oropharynx as per pre-operative assessment

## 2018-08-21 NOTE — Transfer of Care (Signed)
Immediate Anesthesia Transfer of Care Note  Patient: [name]  Procedure(s) Performed: REVISION RIGHT BELOW KNEE AMPUTATION, EXCISION FIBULA (Right Leg Lower)  Patient Location: PACU  Anesthesia Type:General  Level of Consciousness: awake  Airway & Oxygen Therapy: Patient Spontanous Breathing and Patient connected to nasal cannula oxygen  Post-op Assessment: Report given to RN, Post -op Vital signs reviewed and stable and Patient moving all extremities X 4  Post vital signs: Reviewed and stable  Last Vitals:  Vitals Value Taken Time  BP 94/61 08/21/18 0859  Temp    Pulse    Resp 17 08/21/18 0900  SpO2    Vitals shown include unvalidated device data.  Last Pain:  Vitals:   08/21/18 0708  TempSrc:   PainSc: 0-No pain      Patients Stated Pain Goal: 2 (63/14/97 0263)  Complications: No apparent anesthesia complications

## 2018-08-21 NOTE — Op Note (Signed)
08/21/2018  8:46 AM  PATIENT:  Brian Zavala    PRE-OPERATIVE DIAGNOSIS:  Osteomyelitis Right Fibula  POST-OPERATIVE DIAGNOSIS:  Same  PROCEDURE:  REVISION RIGHT BELOW KNEE AMPUTATION, EXCISION FIBULA  SURGEON:  Newt Minion, MD  PHYSICIAN ASSISTANT:None ANESTHESIA:   General  PREOPERATIVE INDICATIONS:  Brian Zavala is a  68 y.o. male with a diagnosis of Osteomyelitis Right Fibula who failed conservative measures and elected for surgical management.    The risks benefits and alternatives were discussed with the patient preoperatively including but not limited to the risks of infection, bleeding, nerve injury, cardiopulmonary complications, the need for revision surgery, among others, and the patient was willing to proceed.  OPERATIVE IMPLANTS: Praveena wound VAC  @ENCIMAGES @  OPERATIVE FINDINGS: Extensive destructive changes of the entire proximal fibula  OPERATIVE PROCEDURE: Patient was brought the operating room and underwent a general anesthetic.  After adequate levels anesthesia were obtained patient's right lower extremity was prepped using DuraPrep draped into a sterile field a timeout was called patient received Keflex preoperatively.  A elliptical incision was made around the ulcerative tissue over the proximal fibula.  This was carried down to bone.  There was extensive destructive changes of the entire proximal fibula and the entire proximal fibula was resected.  The wound was irrigated with normal saline there was good petechial bleeding.  This left a wound that was 4 x 9 cm.  Local tissue rearrangement was used to close the wound 4 x 9 cm with 2-0 nylon.  A Praveena wound VAC was applied covered with Covan this had a good suction fit patient was extubated taken the PACU in stable condition.   DISCHARGE PLANNING:  Antibiotic duration: Preoperative antibiotics  Weightbearing: Nonweightbearing on the right  Pain medication: Prescription for Percocet  Dressing  care/ Wound VAC: Continue wound VAC for 1 week  Ambulatory devices: Walker and wheelchair  Discharge to: Home.  Follow-up: In the office 1 week post operative.

## 2018-08-23 NOTE — Anesthesia Postprocedure Evaluation (Signed)
Anesthesia Post Note  Patient: Brian Zavala  Procedure(s) Performed: REVISION RIGHT BELOW KNEE AMPUTATION, EXCISION FIBULA (Right Leg Lower)     Patient location during evaluation: PACU Anesthesia Type: General Level of consciousness: awake and alert Pain management: pain level controlled Vital Signs Assessment: post-procedure vital signs reviewed and stable Respiratory status: spontaneous breathing, nonlabored ventilation, respiratory function stable and patient connected to nasal cannula oxygen Cardiovascular status: blood pressure returned to baseline and stable Postop Assessment: no apparent nausea or vomiting Anesthetic complications: no    Last Vitals:  Vitals:   08/21/18 1000 08/21/18 1020  BP: 121/83 (!) 147/87  Pulse: 78 80  Resp: (!) 22 20  Temp: 36.6 C   SpO2: 95% 95%    Last Pain:  Vitals:   08/21/18 0950  TempSrc:   PainSc: 5                  Chelsey L Woodrum

## 2018-08-24 ENCOUNTER — Encounter (HOSPITAL_COMMUNITY): Payer: Self-pay | Admitting: Orthopedic Surgery

## 2018-08-28 ENCOUNTER — Ambulatory Visit (INDEPENDENT_AMBULATORY_CARE_PROVIDER_SITE_OTHER): Payer: Medicare (Managed Care) | Admitting: Physician Assistant

## 2018-08-28 ENCOUNTER — Other Ambulatory Visit: Payer: Self-pay

## 2018-08-28 ENCOUNTER — Encounter: Payer: Self-pay | Admitting: Physician Assistant

## 2018-08-28 VITALS — Ht 73.0 in | Wt 180.0 lb

## 2018-08-28 DIAGNOSIS — M86461 Chronic osteomyelitis with draining sinus, right tibia and fibula: Secondary | ICD-10-CM

## 2018-08-28 DIAGNOSIS — E1140 Type 2 diabetes mellitus with diabetic neuropathy, unspecified: Secondary | ICD-10-CM

## 2018-08-28 DIAGNOSIS — I82431 Acute embolism and thrombosis of right popliteal vein: Secondary | ICD-10-CM

## 2018-08-28 DIAGNOSIS — Z794 Long term (current) use of insulin: Secondary | ICD-10-CM

## 2018-08-28 DIAGNOSIS — Z89511 Acquired absence of right leg below knee: Secondary | ICD-10-CM

## 2018-08-28 NOTE — Progress Notes (Signed)
Office Visit Note   Patient: Brian Zavala           Date of Birth: May 08, 1950           MRN: [id number] Visit Date: 08/28/2018              Requested by: [employer] [address] [place],  [place] [address] PCP: [facility]  Chief Complaint  Patient presents with  . Right Leg - Routine Post Op    08/21/18 revision right BKA excision fib  D/C'd vac 50cc drainage.       HPI: The patient is a 68 yo gentleman who is seen for post operative follow up following revision of his right transtibial amputation on 08/21/2018. He has had the Prevena VAC in place for the past week and reports no problems with the VAC. He reports some pain for the first couple of days following surgery, but little to no pain over the past few days.   Assessment & Plan: Visit Diagnoses:  1. History of right below knee amputation (HCC)   2. Type 2 diabetes mellitus with diabetic neuropathy, with long-term current use of insulin (HCC)   3. Deep vein thrombosis (DVT) of popliteal vein of right lower extremity, unspecified chronicity (HCC)   4. 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 Dry dressing applied with ace wrap. Gave prescription for a Vive shrinker stocking from [employer] for patient to apply directly over the incision. May shower and use soap and water to the area.   Follow-Up Instructions: Return in about 11 days (around 09/08/2018).   Ortho Exam  Patient is alert, oriented, no adenopathy, well-dressed, normal affect, normal respiratory effort. Prevena VAC was removed and the incision has scant serosanguineous drainage. Flaps have good color and are without signs of infection or cellulitis. Dry dressing applied and Ace wrap and patient given prescription for Vive shrinker.   Imaging: No results found.   Labs: Lab Results  Component Value Date   HGBA1C 7.6 (H) 08/21/2018   HGBA1C 8.0 (H) 04/19/2018   HGBA1C 7.6 (H) 03/07/2017   ESRSEDRATE 46 (H) 04/18/2018   ESRSEDRATE 2 12/04/2017   CRP 1.4 (H) 04/21/2018   CRP 6.8 (H) 04/18/2018   CRP 3.1 12/04/2017   REPTSTATUS 04/27/2018 FINAL 04/22/2018   GRAMSTAIN  04/22/2018    MODERATE WBC PRESENT,BOTH PMN AND MONONUCLEAR RARE GRAM VARIABLE ROD    CULT  04/22/2018    RARE STAPHYLOCOCCUS AUREUS NO ANAEROBES ISOLATED Performed at [facility], [address], [place], [place] [address]    [facility] STAPHYLOCOCCUS AUREUS 04/22/2018     Lab Results  Component Value Date   ALBUMIN 3.0 (L) 04/19/2018   ALBUMIN 3.3 (L) 04/18/2018   ALBUMIN 3.0 (L) 12/02/2014    Body mass index is 23.75 kg/m.  Orders:  No orders of the defined types were placed in this encounter.  No orders of the defined types were placed in this encounter.    Procedures: No procedures performed  Clinical Data: No additional findings.  ROS:  All other systems negative, except as noted in the HPI. Review of Systems  Objective: Vital Signs: Ht 6\' 1"  (1.854 m)   Wt 180 lb (81.6 kg)   BMI 23.75 kg/m   Specialty Comments:  No specialty comments available.  PMFS History: Patient Active Problem List   Diagnosis Date Noted  .  Dehiscence of amputation stump ([place]) 08/18/2018  . H/O amputation of lesser toe, left ([place]) 05/06/2018  . Peripheral vascular disease ([place]) 04/19/2018  . Diabetic foot infection ([place]) 04/18/2018  . Tobacco abuse counseling   . Critical lower limb ischemia 05/02/2017  . Subacute osteomyelitis of right fibula ([place])   . 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 Diabetes mellitus, insulin dependent (IDDM), controlled ([place])   . Type 2 diabetes mellitus with diabetic neuropathy, with long-term current use of insulin ([place])   . S/P unilateral BKA (below knee amputation) ([place])   . Rheumatoid arthritis ([place])   . Gallstones 09/23/2013  . Non-intractable cyclical  vomiting with nausea 09/23/2013  . Epidermal inclusion cyst 11/30/2012   Past Medical History:  Diagnosis Date  . Arthritis    "pretty much all over"  . Cellulitis of left foot   . COPD (chronic obstructive pulmonary disease) ([place])   . Hyperlipidemia   . Hypertension   . Neuromuscular disorder (HCC)    neuropathy legs  . Neuropathy    legs  . Osteomyelitis ([place]) 04/2018   4th toe left foot  . Osteomyelitis of fourth toe of left foot ([place]) 04/19/2018  . Peripheral vascular disease ([place])   . Type II diabetes mellitus ([place])   . Wears glasses   . Wears partial dentures    top and bottom partials    Family History  Problem Relation Age of Onset  . Cirrhosis Father     Past Surgical History:  Procedure Laterality Date  . AMPUTATION Left 04/22/2018   Procedure: LEFT FOOT 4TH RAY AMPUTATION;  Surgeon: Newt Minion, MD;  Location: [place];  Service: Orthopedics;  Laterality: Left;  . BACK SURGERY    . CHOLECYSTECTOMY N/A 10/26/2013   Procedure: LAPAROSCOPIC CHOLECYSTECTOMY WITH INTRAOPERATIVE CHOLANGIOGRAM;  Surgeon: Joyice Faster. Cornett, MD;  Location: [facility];  Service: General;  Laterality: N/A;  . COLONOSCOPY    . CYST EXCISION Bilateral 02/15/2015   Procedure: LEFT INDEX FINGER AND RIGHT MIDDLE FINGER NODULE EXCISION;  Surgeon: Leandrew Koyanagi, MD;  Location: [place];  Service: Orthopedics;  Laterality: Bilateral;  . CYST EXCISION PERINEAL N/A 12/08/2012   Procedure: CYST EXCISION PERINeum;  Surgeon: Marcello Moores A. Cornett, MD;  Location: [place];  Service: General;  Laterality: N/A;  . FOOT AMPUTATION Right 2005  . I&D EXTREMITY Right 03/07/2017   Procedure: EXCISION FIBULAR HEAD RIGHT BELOW KNEE AMPUTATION;  Surgeon: Newt Minion, MD;  Location: [place];  Service: Orthopedics;  Laterality: Right;  . INCISE AND DRAIN ABCESS  12/02/2014   PERINEAL ABSCESS  . INCISION AND DRAINAGE PERIRECTAL ABSCESS Left 12/02/2014   Procedure: IRRIGATION AND DEBRIDEMENT  PERINEAL ABSCESS;  Surgeon: Coralie Keens, MD;  Location: [place];  Service: General;  Laterality: Left;  . LEG AMPUTATION BELOW KNEE Right 2005  . LOWER EXTREMITY ANGIOGRAPHY N/A 05/05/2017   Procedure: LOWER EXTREMITY ANGIOGRAPHY;  Surgeon: Lorretta Harp, MD;  Location: [place] [facility];  Service: Cardiovascular;  Laterality: N/A;  . LUMBAR DISC SURGERY  82,90   ruptured disc  . PERIPHERAL VASCULAR INTERVENTION Right 05/05/2017   Procedure: PERIPHERAL VASCULAR INTERVENTION;  Surgeon: Lorretta Harp, MD;  Location: [place] [facility];  Service: Cardiovascular;  Laterality: Right;  . SCAR REVISION Right 2005   @ amputation  . STUMP REVISION Right 08/21/2018   Procedure: REVISION RIGHT BELOW KNEE AMPUTATION, EXCISION FIBULA;  Surgeon: Newt Minion, MD;  [facility];  Service: Orthopedics;  Laterality: Right;  . TOE AMPUTATION Right 2005   Social History   Occupational History  . Not on file  Tobacco Use  . Smoking status: Former Smoker    Packs/day: 1.00    Years: 47.00    Pack years: 47.00    Types: Cigarettes    Quit date: 12/2017    Years since quitting: 0.7  . Smokeless tobacco: Never Used  Substance and Sexual Activity  . Alcohol use: No  . Drug use: No  . Sexual activity: Never

## 2018-09-01 ENCOUNTER — Ambulatory Visit: Payer: Medicare (Managed Care) | Admitting: Orthopedic Surgery

## 2018-09-07 ENCOUNTER — Encounter: Payer: Self-pay | Admitting: Orthopedic Surgery

## 2018-09-07 ENCOUNTER — Ambulatory Visit (INDEPENDENT_AMBULATORY_CARE_PROVIDER_SITE_OTHER): Payer: Medicare (Managed Care) | Admitting: Physician Assistant

## 2018-09-07 ENCOUNTER — Other Ambulatory Visit (HOSPITAL_COMMUNITY)
Admission: RE | Admit: 2018-09-07 | Discharge: 2018-09-07 | Disposition: A | Discharge status: Home / Self Care | Payer: Medicare (Managed Care) | Source: Ambulatory Visit | Attending: Orthopedic Surgery | Admitting: Orthopedic Surgery

## 2018-09-07 ENCOUNTER — Other Ambulatory Visit: Payer: Self-pay

## 2018-09-07 VITALS — Ht 73.0 in | Wt 180.0 lb

## 2018-09-07 DIAGNOSIS — I82431 Acute embolism and thrombosis of right popliteal vein: Secondary | ICD-10-CM

## 2018-09-07 DIAGNOSIS — Z01812 Encounter for preprocedural laboratory examination: Secondary | ICD-10-CM | POA: Insufficient documentation

## 2018-09-07 DIAGNOSIS — E1140 Type 2 diabetes mellitus with diabetic neuropathy, unspecified: Secondary | ICD-10-CM

## 2018-09-07 DIAGNOSIS — Z89511 Acquired absence of right leg below knee: Secondary | ICD-10-CM

## 2018-09-07 DIAGNOSIS — Z1159 Encounter for screening for other viral diseases: Secondary | ICD-10-CM | POA: Diagnosis not present

## 2018-09-07 DIAGNOSIS — Z794 Long term (current) use of insulin: Secondary | ICD-10-CM

## 2018-09-07 DIAGNOSIS — T8781 Dehiscence of amputation stump: Secondary | ICD-10-CM

## 2018-09-07 MED ORDER — OXYCODONE-ACETAMINOPHEN 5-325 MG PO TABS: 1.0000 | ORAL_TABLET | Freq: Four times a day (QID) | ORAL | 0 refills | Status: DC | PRN

## 2018-09-07 NOTE — Progress Notes (Signed)
Office Visit Note   Patient: Brian Zavala           Date of Birth: 08-Feb-1951           MRN: [id number] Visit Date: 09/07/2018              Requested by: [employer], [facility] [address] [place],  [place] [address] PCP: [facility], [facility]  Chief Complaint  Patient presents with  . Right Leg - Routine Post Op    08/21/18 revision BKA right leg      HPI: The patient is a 68 year old gentleman who is seen for postoperative follow-up following a revision of his right transtibial amputation on 08/21/2018.  He reports that over the weekend he developed increased redness, increased pain swelling and drainage from the incisional site.  He reports the pain medication is not helping to relieve his pain.  He is on chronic pain management but had achieved good pain control initially postoperatively. He is on Eliquis for DVT of the right lower extremity.  Assessment & Plan: Visit Diagnoses:  1. History of right below knee amputation (HCC)   2. Dehiscence of amputation stump (HCC)   3. Type 2 diabetes mellitus with diabetic neuropathy, with long-term current use of insulin (HCC)   4. Deep vein thrombosis (DVT) of popliteal vein of right lower extremity, unspecified chronicity (HCC)     Plan: We will return to the OR later this week for revision of his right transtibial amputation with cultures in the OR. He will follow-up following surgery next week.  Follow-Up Instructions: Return in about 1 week (around 09/14/2018).   Ortho Exam  Patient is alert, oriented, no adenopathy, well-dressed, normal affect, normal respiratory effort. The right lateral leg transtibial amputation site is draining moderate yellow-tan drainage.  There is odor.  There is peri-incisional redness.  He has warmth about the knee and painful knee range of motion.  Moderate edema.  Sutures are intact.  We will plan for OR later this week with cultures in the OR.   Imaging: No results found.   Labs: Lab Results  Component Value Date   HGBA1C 7.6 (H) 08/21/2018   HGBA1C 8.0 (H) 04/19/2018   HGBA1C 7.6 (H) 03/07/2017   ESRSEDRATE 46 (H) 04/18/2018   ESRSEDRATE 2 12/04/2017   CRP 1.4 (H) 04/21/2018   CRP 6.8 (H) 04/18/2018   CRP 3.1 12/04/2017   REPTSTATUS 04/27/2018 FINAL 04/22/2018   GRAMSTAIN  04/22/2018    MODERATE WBC PRESENT,BOTH PMN AND MONONUCLEAR RARE GRAM VARIABLE ROD    CULT  04/22/2018    RARE STAPHYLOCOCCUS AUREUS NO ANAEROBES ISOLATED Performed at [facility], [address], [place], [place] [address]    [facility] STAPHYLOCOCCUS AUREUS 04/22/2018     Lab Results  Component Value Date   ALBUMIN 3.0 (L) 04/19/2018   ALBUMIN 3.3 (L) 04/18/2018   ALBUMIN 3.0 (L) 12/02/2014    Lab Results  Component Value Date   MG 1.8 12/03/2014   No results found for: VD25OH  No results found for: PREALBUMIN CBC EXTENDED Latest Ref Rng & Units 08/21/2018 04/23/2018 04/22/2018  WBC 4.0 - 10.5 K/uL 8.0 9.7 7.8  RBC 4.22 - 5.81 MIL/uL 4.95 5.11 5.22  HGB 13.0 - 17.0 g/dL 41.6 60.6 30.1  HCT 60.1 - 52.0 % 42.5 42.3 42.9  PLT 150 - 400 K/uL 303 284 276  NEUTROABS 1.7 - 7.7 K/uL - - -  LYMPHSABS  0.7 - 4.0 K/uL - - -     Body mass index is 23.75 kg/m.  Orders:  No orders of the defined types were placed in this encounter.  Meds ordered this encounter  Medications  . oxyCODONE-acetaminophen (PERCOCET/ROXICET) 5-325 MG tablet    Sig: Take 1 tablet by mouth every 6 (six) hours as needed for severe pain.    Dispense:  28 tablet    Refill:  0     Procedures: No procedures performed  Clinical Data: No additional findings.  ROS:  All other systems negative, except as noted in the HPI. Review of Systems  Objective: Vital Signs: Ht 6\' 1"  (1.854 m)   Wt 180 lb (81.6 kg)   BMI 23.75 kg/m   Specialty Comments:  No specialty comments available.  PMFS History: Patient Active Problem List   Diagnosis Date Noted  .  Dehiscence of amputation stump (HCC) 08/18/2018  . H/O amputation of lesser toe, left (HCC) 05/06/2018  . Peripheral vascular disease (HCC) 04/19/2018  . Diabetic foot infection (HCC) 04/18/2018  . Tobacco abuse counseling   . Critical lower limb ischemia 05/02/2017  . Subacute osteomyelitis of right fibula (HCC)   . 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 Diabetes mellitus, insulin dependent (IDDM), controlled (HCC)   . Type 2 diabetes mellitus with diabetic neuropathy, with long-term current use of insulin (HCC)   . S/P unilateral BKA (below knee amputation) (HCC)   . Rheumatoid arthritis (HCC)   . Gallstones 09/23/2013  . Non-intractable cyclical vomiting with nausea 09/23/2013  . Epidermal inclusion cyst 11/30/2012   Past Medical History:  Diagnosis Date  . Arthritis    "pretty much all over"  . Cellulitis of left foot   . COPD (chronic obstructive pulmonary disease) (HCC)   . Hyperlipidemia   . Hypertension   . Neuromuscular disorder (HCC)    neuropathy legs  . Neuropathy    legs  . Osteomyelitis (HCC) 04/2018   4th toe left foot  . Osteomyelitis of fourth toe of left foot (HCC) 04/19/2018  . Peripheral vascular disease (HCC)   . Type II diabetes mellitus (HCC)   . Wears glasses   . Wears partial dentures    top and bottom partials    Family History  Problem Relation Age of Onset  . Cirrhosis Father     Past Surgical History:  Procedure Laterality Date  . AMPUTATION Left 04/22/2018   Procedure: LEFT FOOT 4TH RAY AMPUTATION;  Surgeon: [name], MD;  Location: [facility] OR;  Service: Orthopedics;  Laterality: Left;  . BACK SURGERY    . CHOLECYSTECTOMY N/A 10/26/2013   Procedure: LAPAROSCOPIC CHOLECYSTECTOMY WITH INTRAOPERATIVE CHOLANGIOGRAM;  Surgeon: Clovis Pu. Cornett, MD;  Location: [facility] OR;  Service: General;  Laterality: N/A;  . COLONOSCOPY    . CYST EXCISION Bilateral 02/15/2015   Procedure: LEFT INDEX FINGER AND RIGHT  MIDDLE FINGER NODULE EXCISION;  Surgeon: Tarry Kos, MD;  Location: [facility];  Service: Orthopedics;  Laterality: Bilateral;  . CYST EXCISION PERINEAL N/A 12/08/2012   Procedure: CYST EXCISION PERINeum;  Surgeon: Maisie Fus A. Cornett, MD;  Location: [facility];  Service: General;  Laterality: N/A;  . FOOT AMPUTATION Right 2005  . I&D EXTREMITY Right 03/07/2017   Procedure: EXCISION FIBULAR HEAD RIGHT BELOW KNEE AMPUTATION;  Surgeon: [name], MD;  Location: [facility] OR;  Service: Orthopedics;  Laterality: Right;  . INCISE AND DRAIN ABCESS  12/02/2014  PERINEAL ABSCESS  . INCISION AND DRAINAGE PERIRECTAL ABSCESS Left 12/02/2014   Procedure: IRRIGATION AND DEBRIDEMENT PERINEAL ABSCESS;  Surgeon: Coralie Keens, MD;  Location: [facility];  Service: General;  Laterality: Left;  . LEG AMPUTATION BELOW KNEE Right 2005  . LOWER EXTREMITY ANGIOGRAPHY N/A 05/05/2017   Procedure: LOWER EXTREMITY ANGIOGRAPHY;  Surgeon: Lorretta Harp, MD;  Location: [facility];  Service: Cardiovascular;  Laterality: N/A;  . LUMBAR DISC SURGERY  82,90   ruptured disc  . PERIPHERAL VASCULAR INTERVENTION Right 05/05/2017   Procedure: PERIPHERAL VASCULAR INTERVENTION;  Surgeon: Lorretta Harp, MD;  Location: [facility];  Service: Cardiovascular;  Laterality: Right;  . SCAR REVISION Right 2005   @ amputation  . STUMP REVISION Right 08/21/2018   Procedure: REVISION RIGHT BELOW KNEE AMPUTATION, EXCISION FIBULA;  Surgeon: Newt Minion, MD;  Location: [facility];  Service: Orthopedics;  Laterality: Right;  . TOE AMPUTATION Right 2005   Social History   Occupational History  . Not on file  Tobacco Use  . Smoking status: Former Smoker    Packs/day: 1.00    Years: 47.00    Pack years: 47.00    Types: Cigarettes    Quit date: 12/2017    Years since quitting: 0.7  . Smokeless tobacco: Never Used  Substance and Sexual Activity  . Alcohol use: No  . Drug use: No  . Sexual activity:  Never

## 2018-09-08 ENCOUNTER — Encounter (HOSPITAL_COMMUNITY): Payer: Self-pay | Admitting: *Deleted

## 2018-09-08 ENCOUNTER — Ambulatory Visit: Payer: Medicare (Managed Care) | Admitting: Orthopedic Surgery

## 2018-09-08 ENCOUNTER — Other Ambulatory Visit: Payer: Self-pay

## 2018-09-08 LAB — SARS CORONAVIRUS 2 (TAT 6-24 HRS): SARS Coronavirus 2: NEGATIVE

## 2018-09-08 NOTE — Progress Notes (Signed)
Brian Zavala denies chest pain or shortness or breath- "but I am not up walking,."  Brian Zavala was tested COVID and has been at home alone.   Brian. Zavala has Type II diabetes. CBG was 45 this am and has been 167 highest in 2 weeks.  I instructed patient to take 50 units of Troujeo tonight. I instructed patient to check CBG after awaking and every 2 hours until arrival  to the hospital.  I Instructed patient if CBG is less than 70 to drink 1/2 cup of a clear juice. Recheck CBG in 15 minutes then call pre- op desk at [phone] for further instructions.  I instructed patient to not eat after midnight, but may have clear liquids until 1045.  We went over what clear liquids consist of .  I asked patient if he has any Gatorade 2, he said no , but he would have someone pick some up, I instructed patient to drink 12 ounces between 1015 and 1045.

## 2018-09-09 ENCOUNTER — Encounter (HOSPITAL_COMMUNITY): Payer: Self-pay | Admitting: Orthopedic Surgery

## 2018-09-09 ENCOUNTER — Inpatient Hospital Stay (HOSPITAL_COMMUNITY): Payer: Medicare (Managed Care) | Admitting: Anesthesiology

## 2018-09-09 ENCOUNTER — Encounter (HOSPITAL_COMMUNITY)
Admission: RE | Disposition: A | Discharge status: Home Health | Payer: Self-pay | Source: Home / Self Care | Attending: Orthopedic Surgery

## 2018-09-09 ENCOUNTER — Other Ambulatory Visit: Payer: Self-pay

## 2018-09-09 ENCOUNTER — Inpatient Hospital Stay (HOSPITAL_COMMUNITY)
Admission: RE | Admit: 2018-09-09 | Discharge: 2018-09-11 | DRG: 464 | Disposition: A | Discharge status: Home Health | Payer: Medicare (Managed Care) | Attending: Orthopedic Surgery | Admitting: Orthopedic Surgery

## 2018-09-09 DIAGNOSIS — E1142 Type 2 diabetes mellitus with diabetic polyneuropathy: Secondary | ICD-10-CM | POA: Diagnosis present

## 2018-09-09 DIAGNOSIS — E1151 Type 2 diabetes mellitus with diabetic peripheral angiopathy without gangrene: Secondary | ICD-10-CM | POA: Diagnosis present

## 2018-09-09 DIAGNOSIS — Z794 Long term (current) use of insulin: Secondary | ICD-10-CM

## 2018-09-09 DIAGNOSIS — Z1159 Encounter for screening for other viral diseases: Secondary | ICD-10-CM | POA: Diagnosis not present

## 2018-09-09 DIAGNOSIS — Z7901 Long term (current) use of anticoagulants: Secondary | ICD-10-CM

## 2018-09-09 DIAGNOSIS — Y835 Amputation of limb(s) as the cause of abnormal reaction of the patient, or of later complication, without mention of misadventure at the time of the procedure: Secondary | ICD-10-CM | POA: Diagnosis present

## 2018-09-09 DIAGNOSIS — Z87891 Personal history of nicotine dependence: Secondary | ICD-10-CM

## 2018-09-09 DIAGNOSIS — IMO0001 Reserved for inherently not codable concepts without codable children: Secondary | ICD-10-CM

## 2018-09-09 DIAGNOSIS — Z79899 Other long term (current) drug therapy: Secondary | ICD-10-CM | POA: Diagnosis not present

## 2018-09-09 DIAGNOSIS — T8781 Dehiscence of amputation stump: Secondary | ICD-10-CM | POA: Diagnosis present

## 2018-09-09 DIAGNOSIS — E1169 Type 2 diabetes mellitus with other specified complication: Secondary | ICD-10-CM | POA: Diagnosis present

## 2018-09-09 DIAGNOSIS — I10 Essential (primary) hypertension: Secondary | ICD-10-CM | POA: Diagnosis present

## 2018-09-09 DIAGNOSIS — T8743 Infection of amputation stump, right lower extremity: Principal | ICD-10-CM | POA: Diagnosis present

## 2018-09-09 DIAGNOSIS — S88111A Complete traumatic amputation at level between knee and ankle, right lower leg, initial encounter: Secondary | ICD-10-CM | POA: Diagnosis present

## 2018-09-09 DIAGNOSIS — M86261 Subacute osteomyelitis, right tibia and fibula: Secondary | ICD-10-CM | POA: Diagnosis present

## 2018-09-09 DIAGNOSIS — J449 Chronic obstructive pulmonary disease, unspecified: Secondary | ICD-10-CM | POA: Diagnosis present

## 2018-09-09 DIAGNOSIS — E785 Hyperlipidemia, unspecified: Secondary | ICD-10-CM | POA: Diagnosis present

## 2018-09-09 DIAGNOSIS — E1140 Type 2 diabetes mellitus with diabetic neuropathy, unspecified: Secondary | ICD-10-CM

## 2018-09-09 HISTORY — DX: Dyspnea, unspecified: R06.00

## 2018-09-09 HISTORY — PX: STUMP REVISION: SHX6102

## 2018-09-09 LAB — BASIC METABOLIC PANEL
Anion gap: 9 (ref 5–15)
BUN: 8 mg/dL (ref 8–23)
CO2: 27 mmol/L (ref 22–32)
Calcium: 8.8 mg/dL — ABNORMAL LOW (ref 8.9–10.3)
Chloride: 98 mmol/L (ref 98–111)
Creatinine, Ser: 0.68 mg/dL (ref 0.61–1.24)
GFR calc Af Amer: >60 mL/min (ref >60)
GFR calc non Af Amer: >60 mL/min (ref >60)
Glucose, Bld: 121 mg/dL — ABNORMAL HIGH (ref 70–99)
Potassium: 4 mmol/L (ref 3.5–5.1)
Sodium: 134 mmol/L — ABNORMAL LOW (ref 135–145)

## 2018-09-09 LAB — GLUCOSE, CAPILLARY
Glucose-Capillary: 119 mg/dL — ABNORMAL HIGH (ref 70–99)
Glucose-Capillary: 89 mg/dL (ref 70–99)
Glucose-Capillary: 77 mg/dL (ref 70–99)
Glucose-Capillary: 69 mg/dL — ABNORMAL LOW (ref 70–99)

## 2018-09-09 LAB — CBC
HCT: 40.9 % (ref 39.0–52.0)
Hemoglobin: 13.4 g/dL (ref 13.0–17.0)
MCH: 28.2 pg (ref 26.0–34.0)
MCHC: 32.8 g/dL (ref 30.0–36.0)
MCV: 85.9 fL (ref 80.0–100.0)
Platelets: 349 10*3/uL (ref 150–400)
RBC: 4.76 MIL/uL (ref 4.22–5.81)
RDW: 14.3 % (ref 11.5–15.5)
WBC: 9.8 10*3/uL (ref 4.0–10.5)
nRBC: 0 % (ref 0.0–0.2)

## 2018-09-09 LAB — PROTIME-INR
INR: 1.1 (ref 0.8–1.2)
Prothrombin Time: 13.7 seconds (ref 11.4–15.2)

## 2018-09-09 SURGERY — REVISION, AMPUTATION SITE
Anesthesia: General | Site: Leg Lower | Laterality: Right

## 2018-09-09 MED ORDER — FENTANYL CITRATE (PF) 250 MCG/5ML IJ SOLN
INTRAMUSCULAR | Status: AC
  Filled 2018-09-09: qty 5

## 2018-09-09 MED ORDER — DOCUSATE SODIUM 100 MG PO CAPS
100.0000 mg | ORAL_CAPSULE | Freq: Two times a day (BID) | ORAL | Status: DC
  Administered 2018-09-09 – 2018-09-10 (×3): 100 mg via ORAL
  Filled 2018-09-09 (×4): qty 1

## 2018-09-09 MED ORDER — FENTANYL CITRATE (PF) 100 MCG/2ML IJ SOLN
25.0000 ug | INTRAMUSCULAR | Status: DC | PRN
  Administered 2018-09-09 (×2): 25 ug via INTRAVENOUS

## 2018-09-09 MED ORDER — PROPOFOL 10 MG/ML IV BOLUS
INTRAVENOUS | Status: DC | PRN
  Administered 2018-09-09: 100 mg via INTRAVENOUS

## 2018-09-09 MED ORDER — ASPIRIN 81 MG PO CHEW
81.0000 mg | CHEWABLE_TABLET | Freq: Every day | ORAL | Status: DC
  Administered 2018-09-10 – 2018-09-11 (×2): 81 mg via ORAL
  Filled 2018-09-09 (×2): qty 1

## 2018-09-09 MED ORDER — ACETAMINOPHEN 325 MG PO TABS: 325.0000 mg | ORAL_TABLET | Freq: Four times a day (QID) | ORAL | Status: DC | PRN

## 2018-09-09 MED ORDER — INSULIN GLARGINE 100 UNIT/ML ~~LOC~~ SOLN
40.0000 [IU] | Freq: Every day | SUBCUTANEOUS | Status: DC
  Filled 2018-09-09 (×2): qty 0.4

## 2018-09-09 MED ORDER — METHOCARBAMOL 1000 MG/10ML IJ SOLN
500.0000 mg | Freq: Four times a day (QID) | INTRAVENOUS | Status: DC | PRN
  Filled 2018-09-09: qty 5

## 2018-09-09 MED ORDER — MAGNESIUM CITRATE PO SOLN: 1.0000 | Freq: Once | ORAL | Status: DC | PRN

## 2018-09-09 MED ORDER — HYDROCHLOROTHIAZIDE 12.5 MG PO CAPS
12.5000 mg | ORAL_CAPSULE | Freq: Every day | ORAL | Status: DC
  Administered 2018-09-10 – 2018-09-11 (×2): 12.5 mg via ORAL
  Filled 2018-09-09 (×2): qty 1

## 2018-09-09 MED ORDER — CEFAZOLIN SODIUM-DEXTROSE 2-4 GM/100ML-% IV SOLN
INTRAVENOUS | Status: AC
  Filled 2018-09-09: qty 100

## 2018-09-09 MED ORDER — DIPHENHYDRAMINE HCL 50 MG/ML IJ SOLN
INTRAMUSCULAR | Status: DC | PRN
  Administered 2018-09-09: 6.25 mg via INTRAVENOUS

## 2018-09-09 MED ORDER — LIDOCAINE 2% (20 MG/ML) 5 ML SYRINGE
INTRAMUSCULAR | Status: DC | PRN
  Administered 2018-09-09: 80 mg via INTRAVENOUS

## 2018-09-09 MED ORDER — FENTANYL CITRATE (PF) 250 MCG/5ML IJ SOLN
INTRAMUSCULAR | Status: DC | PRN
  Administered 2018-09-09: 25 ug via INTRAVENOUS
  Administered 2018-09-09: 50 ug via INTRAVENOUS

## 2018-09-09 MED ORDER — ATORVASTATIN CALCIUM 80 MG PO TABS
80.0000 mg | ORAL_TABLET | Freq: Every day | ORAL | Status: DC
  Administered 2018-09-09 – 2018-09-10 (×2): 80 mg via ORAL
  Filled 2018-09-09 (×2): qty 1

## 2018-09-09 MED ORDER — METOCLOPRAMIDE HCL 5 MG/ML IJ SOLN: 5.0000 mg | Freq: Three times a day (TID) | INTRAMUSCULAR | Status: DC | PRN

## 2018-09-09 MED ORDER — PREDNISONE 1 MG PO TABS
2.0000 mg | ORAL_TABLET | Freq: Every day | ORAL | Status: DC
  Administered 2018-09-10 – 2018-09-11 (×2): 2 mg via ORAL
  Filled 2018-09-09 (×3): qty 2

## 2018-09-09 MED ORDER — ONDANSETRON HCL 4 MG PO TABS: 4.0000 mg | ORAL_TABLET | Freq: Four times a day (QID) | ORAL | Status: DC | PRN

## 2018-09-09 MED ORDER — PREGABALIN 25 MG PO CAPS: 150.0000 mg | ORAL_CAPSULE | Freq: Every day | ORAL | Status: DC

## 2018-09-09 MED ORDER — PROPOFOL 10 MG/ML IV BOLUS
INTRAVENOUS | Status: AC
  Filled 2018-09-09: qty 20

## 2018-09-09 MED ORDER — LACTATED RINGERS IV SOLN
INTRAVENOUS | Status: DC
  Administered 2018-09-09: 11:00:00 via INTRAVENOUS

## 2018-09-09 MED ORDER — HYDROMORPHONE HCL 1 MG/ML IJ SOLN
0.5000 mg | INTRAMUSCULAR | Status: DC | PRN
  Administered 2018-09-09: 1 mg via INTRAVENOUS
  Administered 2018-09-09: 0.5 mg via INTRAVENOUS
  Administered 2018-09-10 – 2018-09-11 (×3): 1 mg via INTRAVENOUS
  Filled 2018-09-09 (×5): qty 1

## 2018-09-09 MED ORDER — POVIDONE-IODINE 10 % EX SWAB: 2.0000 "application " | Freq: Once | CUTANEOUS | Status: DC

## 2018-09-09 MED ORDER — MIDAZOLAM HCL 2 MG/2ML IJ SOLN
INTRAMUSCULAR | Status: AC
  Filled 2018-09-09: qty 2

## 2018-09-09 MED ORDER — MORPHINE SULFATE ER 100 MG PO TBCR
100.0000 mg | EXTENDED_RELEASE_TABLET | Freq: Two times a day (BID) | ORAL | Status: DC
  Administered 2018-09-10 – 2018-09-11 (×3): 100 mg via ORAL
  Filled 2018-09-09 (×4): qty 1

## 2018-09-09 MED ORDER — CHLORHEXIDINE GLUCONATE 4 % EX LIQD: 60.0000 mL | Freq: Once | CUTANEOUS | Status: DC

## 2018-09-09 MED ORDER — ONDANSETRON HCL 4 MG/2ML IJ SOLN
INTRAMUSCULAR | Status: AC
  Filled 2018-09-09: qty 2

## 2018-09-09 MED ORDER — PREGABALIN 25 MG PO CAPS: 75.0000 mg | ORAL_CAPSULE | Freq: Two times a day (BID) | ORAL | Status: DC

## 2018-09-09 MED ORDER — CEFAZOLIN SODIUM-DEXTROSE 2-3 GM-%(50ML) IV SOLR
INTRAVENOUS | Status: DC | PRN
  Administered 2018-09-09: 2 g via INTRAVENOUS

## 2018-09-09 MED ORDER — POLYETHYLENE GLYCOL 3350 17 G PO PACK: 17.0000 g | PACK | Freq: Every day | ORAL | Status: DC | PRN

## 2018-09-09 MED ORDER — PREGABALIN 25 MG PO CAPS
75.0000 mg | ORAL_CAPSULE | Freq: Every day | ORAL | Status: DC
  Administered 2018-09-10 – 2018-09-11 (×2): 75 mg via ORAL
  Filled 2018-09-09 (×3): qty 3

## 2018-09-09 MED ORDER — ACETAMINOPHEN 500 MG PO TABS
1000.0000 mg | ORAL_TABLET | Freq: Once | ORAL | Status: AC
  Administered 2018-09-09: 1000 mg via ORAL
  Filled 2018-09-09: qty 2

## 2018-09-09 MED ORDER — ONDANSETRON HCL 4 MG/2ML IJ SOLN: 4.0000 mg | Freq: Four times a day (QID) | INTRAMUSCULAR | Status: DC | PRN

## 2018-09-09 MED ORDER — SODIUM CHLORIDE 0.9 % IV SOLN
INTRAVENOUS | Status: DC
  Administered 2018-09-09: 17:00:00 via INTRAVENOUS

## 2018-09-09 MED ORDER — CLOPIDOGREL BISULFATE 75 MG PO TABS
75.0000 mg | ORAL_TABLET | Freq: Every day | ORAL | Status: DC
  Administered 2018-09-10 – 2018-09-11 (×2): 75 mg via ORAL
  Filled 2018-09-09 (×2): qty 1

## 2018-09-09 MED ORDER — METFORMIN HCL 500 MG PO TABS
1000.0000 mg | ORAL_TABLET | Freq: Two times a day (BID) | ORAL | Status: DC
  Administered 2018-09-09 – 2018-09-11 (×4): 1000 mg via ORAL
  Filled 2018-09-09 (×4): qty 2

## 2018-09-09 MED ORDER — METHOCARBAMOL 500 MG PO TABS
500.0000 mg | ORAL_TABLET | Freq: Four times a day (QID) | ORAL | Status: DC | PRN
  Administered 2018-09-09: 500 mg via ORAL
  Filled 2018-09-09: qty 1

## 2018-09-09 MED ORDER — MIDAZOLAM HCL 2 MG/2ML IJ SOLN
INTRAMUSCULAR | Status: DC | PRN
  Administered 2018-09-09: 2 mg via INTRAVENOUS

## 2018-09-09 MED ORDER — BISACODYL 10 MG RE SUPP: 10.0000 mg | Freq: Every day | RECTAL | Status: DC | PRN

## 2018-09-09 MED ORDER — OXYCODONE HCL 5 MG PO TABS
10.0000 mg | ORAL_TABLET | ORAL | Status: DC | PRN
  Administered 2018-09-09: 17:00:00 10 mg via ORAL
  Administered 2018-09-09 – 2018-09-11 (×5): 15 mg via ORAL
  Filled 2018-09-09 (×5): qty 3
  Filled 2018-09-09: qty 2

## 2018-09-09 MED ORDER — LIDOCAINE 2% (20 MG/ML) 5 ML SYRINGE
INTRAMUSCULAR | Status: AC
  Filled 2018-09-09: qty 5

## 2018-09-09 MED ORDER — INSULIN ASPART 100 UNIT/ML ~~LOC~~ SOLN
4.0000 [IU] | Freq: Three times a day (TID) | SUBCUTANEOUS | Status: DC
  Administered 2018-09-10 – 2018-09-11 (×4): 4 [IU] via SUBCUTANEOUS

## 2018-09-09 MED ORDER — CEFAZOLIN SODIUM-DEXTROSE 1-4 GM/50ML-% IV SOLN
1.0000 g | Freq: Three times a day (TID) | INTRAVENOUS | Status: DC
  Administered 2018-09-09 – 2018-09-11 (×6): 1 g via INTRAVENOUS
  Filled 2018-09-09 (×6): qty 50

## 2018-09-09 MED ORDER — LIRAGLUTIDE 18 MG/3ML ~~LOC~~ SOLN: 1.8000 mg | Freq: Every evening | SUBCUTANEOUS | Status: DC

## 2018-09-09 MED ORDER — FENTANYL CITRATE (PF) 100 MCG/2ML IJ SOLN
INTRAMUSCULAR | Status: AC
  Filled 2018-09-09: qty 2

## 2018-09-09 MED ORDER — ENSURE PRE-SURGERY PO LIQD
296.0000 mL | Freq: Once | ORAL | Status: DC
  Filled 2018-09-09: qty 296

## 2018-09-09 MED ORDER — INSULIN ASPART 100 UNIT/ML ~~LOC~~ SOLN
0.0000 [IU] | Freq: Three times a day (TID) | SUBCUTANEOUS | Status: DC
  Administered 2018-09-10: 3 [IU] via SUBCUTANEOUS
  Administered 2018-09-10: 17:00:00 2 [IU] via SUBCUTANEOUS
  Administered 2018-09-11 (×2): 5 [IU] via SUBCUTANEOUS

## 2018-09-09 MED ORDER — FUROSEMIDE 20 MG PO TABS
20.0000 mg | ORAL_TABLET | Freq: Every day | ORAL | Status: DC
  Administered 2018-09-10 – 2018-09-11 (×2): 20 mg via ORAL
  Filled 2018-09-09 (×2): qty 1

## 2018-09-09 MED ORDER — PREGABALIN 25 MG PO CAPS
150.0000 mg | ORAL_CAPSULE | Freq: Every day | ORAL | Status: DC
  Administered 2018-09-10: 150 mg via ORAL
  Filled 2018-09-09: qty 2

## 2018-09-09 MED ORDER — DIPHENHYDRAMINE HCL 50 MG/ML IJ SOLN
INTRAMUSCULAR | Status: AC
  Filled 2018-09-09: qty 1

## 2018-09-09 MED ORDER — ONDANSETRON HCL 4 MG/2ML IJ SOLN: 4.0000 mg | Freq: Once | INTRAMUSCULAR | Status: DC | PRN

## 2018-09-09 MED ORDER — METOCLOPRAMIDE HCL 5 MG PO TABS: 5.0000 mg | ORAL_TABLET | Freq: Three times a day (TID) | ORAL | Status: DC | PRN

## 2018-09-09 MED ORDER — 0.9 % SODIUM CHLORIDE (POUR BTL) OPTIME
TOPICAL | Status: DC | PRN
  Administered 2018-09-09: 1000 mL

## 2018-09-09 MED ORDER — LEFLUNOMIDE 20 MG PO TABS
20.0000 mg | ORAL_TABLET | Freq: Every day | ORAL | Status: DC
  Administered 2018-09-10 – 2018-09-11 (×2): 20 mg via ORAL
  Filled 2018-09-09 (×2): qty 1

## 2018-09-09 MED ORDER — OXYCODONE HCL 5 MG PO TABS
5.0000 mg | ORAL_TABLET | ORAL | Status: DC | PRN
  Administered 2018-09-10 (×2): 10 mg via ORAL
  Filled 2018-09-09 (×2): qty 2

## 2018-09-09 MED ORDER — ONDANSETRON HCL 4 MG/2ML IJ SOLN
INTRAMUSCULAR | Status: DC | PRN
  Administered 2018-09-09: 4 mg via INTRAVENOUS

## 2018-09-09 SURGICAL SUPPLY — 33 items
BLADE SAW RECIP 87.9 MT (BLADE) IMPLANT
BLADE SURG 21 STRL SS (BLADE) ×3 IMPLANT
BNDG COHESIVE 4X5 TAN STRL (GAUZE/BANDAGES/DRESSINGS) ×2 IMPLANT
CANISTER WOUND CARE 500ML ATS (WOUND CARE) ×3 IMPLANT
CANISTER WOUNDNEG PRESSURE 500 (CANNISTER) ×2 IMPLANT
COVER SURGICAL LIGHT HANDLE (MISCELLANEOUS) ×3 IMPLANT
COVER WAND RF STERILE (DRAPES) ×3 IMPLANT
DRAPE EXTREMITY T 121X128X90 (DISPOSABLE) ×3 IMPLANT
DRAPE HALF SHEET 40X57 (DRAPES) ×3 IMPLANT
DRAPE INCISE IOBAN 66X45 STRL (DRAPES) ×5 IMPLANT
DRAPE U-SHAPE 47X51 STRL (DRAPES) ×6 IMPLANT
DRESSING PREVENA PLUS CUSTOM (GAUZE/BANDAGES/DRESSINGS) ×1 IMPLANT
DRSG PREVENA PLUS CUSTOM (GAUZE/BANDAGES/DRESSINGS) ×3
DURAPREP 26ML APPLICATOR (WOUND CARE) ×3 IMPLANT
ELECT REM PT RETURN 9FT ADLT (ELECTROSURGICAL) ×3
ELECTRODE REM PT RTRN 9FT ADLT (ELECTROSURGICAL) ×1 IMPLANT
GLOVE BIOGEL PI IND STRL 9 (GLOVE) ×1 IMPLANT
GLOVE BIOGEL PI INDICATOR 9 (GLOVE) ×2
GLOVE SURG ORTHO 9.0 STRL STRW (GLOVE) ×3 IMPLANT
GOWN STRL REUS W/ TWL XL LVL3 (GOWN DISPOSABLE) ×2 IMPLANT
GOWN STRL REUS W/TWL XL LVL3 (GOWN DISPOSABLE) ×4
KIT BASIN OR (CUSTOM PROCEDURE TRAY) ×3 IMPLANT
KIT TURNOVER KIT B (KITS) ×3 IMPLANT
MANIFOLD NEPTUNE II (INSTRUMENTS) ×3 IMPLANT
NS IRRIG 1000ML POUR BTL (IV SOLUTION) ×3 IMPLANT
PACK GENERAL/GYN (CUSTOM PROCEDURE TRAY) ×3 IMPLANT
PAD ARMBOARD 7.5X6 YLW CONV (MISCELLANEOUS) ×3 IMPLANT
PREVENA RESTOR ARTHOFORM 46X30 (CANNISTER) ×3 IMPLANT
STAPLER VISISTAT 35W (STAPLE) IMPLANT
SUT ETHILON 2 0 PSLX (SUTURE) ×6 IMPLANT
SUT SILK 2 0 (SUTURE)
SUT SILK 2-0 18XBRD TIE 12 (SUTURE) IMPLANT
TOWEL GREEN STERILE (TOWEL DISPOSABLE) ×3 IMPLANT

## 2018-09-09 NOTE — Anesthesia Procedure Notes (Signed)
Procedure Name: LMA Insertion Date/Time: 09/09/2018 1:39 PM Performed by: Barrington Ellison, CRNA Pre-anesthesia Checklist: Patient identified, Emergency Drugs available, Suction available and Patient being monitored Patient Re-evaluated:Patient Re-evaluated prior to induction Oxygen Delivery Method: Circle System Utilized Preoxygenation: Pre-oxygenation with 100% oxygen Induction Type: IV induction LMA: LMA inserted LMA Size: 5.0 Number of attempts: 1 Placement Confirmation: positive ETCO2 Tube secured with: Tape Dental Injury: Teeth and Oropharynx as per pre-operative assessment

## 2018-09-09 NOTE — Transfer of Care (Signed)
Immediate Anesthesia Transfer of Care Note  Patient: Brian Zavala  Procedure(s) Performed: REVISION RIGHT BELOW KNEE AMPUTATION (Right Leg Lower)  Patient Location: PACU  Anesthesia Type:General  Level of Consciousness: lethargic and responds to stimulation  Airway & Oxygen Therapy: Patient Spontanous Breathing  Post-op Assessment: Report given to RN  Post vital signs: Reviewed and stable  Last Vitals:  Vitals Value Taken Time  BP 102/69 09/09/18 1416  Temp    Pulse 87 09/09/18 1418  Resp 30 09/09/18 1418  SpO2 90 % 09/09/18 1418  Vitals shown include unvalidated device data.  Last Pain:  Vitals:   09/09/18 1107  TempSrc:   PainSc: 0-No pain      Patients Stated Pain Goal: 3 (88/82/80 0349)  Complications: No apparent anesthesia complications

## 2018-09-09 NOTE — H&P (Signed)
Brian Zavala is an 68 y.o. male.   Chief Complaint: dehiscence BKA revision right HPI: The patient is Brian Zavala who is seen for postoperative follow-up following Brian revision of his right transtibial amputation on 08/21/2018.  He reports that over the weekend he developed increased redness, increased pain swelling and drainage from the incisional site.  He reports the pain medication is not helping to relieve his pain.  He is on chronic pain management but had achieved good pain control initially postoperatively. He is on Eliquis for DVT of the right lower extremity.  Past Medical History:  Diagnosis Date  . Arthritis    "pretty much all over"  . Cellulitis of left foot   . COPD (chronic obstructive pulmonary disease) (HCC)   . Dyspnea   . Hyperlipidemia   . Hypertension   . Neuromuscular disorder (HCC)    neuropathy legs  . Neuropathy    legs  . Osteomyelitis (HCC) 04/2018   4th toe left foot  . Osteomyelitis of fourth toe of left foot (HCC) 04/19/2018  . Peripheral vascular disease (HCC)   . Type II diabetes mellitus (HCC)   . Wears glasses   . Wears partial dentures    top and bottom partials    Past Surgical History:  Procedure Laterality Date  . AMPUTATION Left 04/22/2018   Procedure: LEFT FOOT 4TH RAY AMPUTATION;  Surgeon: [name], MD;  Location: [facility] OR;  Service: Orthopedics;  Laterality: Left;  . BACK SURGERY    . CHOLECYSTECTOMY N/Brian 10/26/2013   Procedure: LAPAROSCOPIC CHOLECYSTECTOMY WITH INTRAOPERATIVE CHOLANGIOGRAM;  Surgeon: Clovis Pu. Cornett, MD;  Location: [facility] OR;  Service: General;  Laterality: N/Brian;  . COLONOSCOPY    . CYST EXCISION Bilateral 02/15/2015   Procedure: LEFT INDEX FINGER AND RIGHT MIDDLE FINGER NODULE EXCISION;  Surgeon: Tarry Kos, MD;  Location: [facility];  Service: Orthopedics;  Laterality: Bilateral;  . CYST EXCISION PERINEAL N/Brian 12/08/2012   Procedure: CYST EXCISION PERINeum;  Surgeon: Maisie Fus Brian. Cornett, MD;   Location: [facility];  Service: General;  Laterality: N/Brian;  . FOOT AMPUTATION Right 2005  . I&D EXTREMITY Right 03/07/2017   Procedure: EXCISION FIBULAR HEAD RIGHT BELOW KNEE AMPUTATION;  Surgeon: [name], MD;  Location: [facility] OR;  Service: Orthopedics;  Laterality: Right;  . INCISE AND DRAIN ABCESS  12/02/2014   PERINEAL ABSCESS  . INCISION AND DRAINAGE PERIRECTAL ABSCESS Left 12/02/2014   Procedure: IRRIGATION AND DEBRIDEMENT PERINEAL ABSCESS;  Surgeon: Abigail Miyamoto, MD;  Location: [facility];  Service: General;  Laterality: Left;  . LEG AMPUTATION BELOW KNEE Right 2005  . LOWER EXTREMITY ANGIOGRAPHY N/Brian 05/05/2017   Procedure: LOWER EXTREMITY ANGIOGRAPHY;  Surgeon: [name], MD;  [facility];  Service: Cardiovascular;  Laterality: N/Brian;  . LUMBAR DISC SURGERY  82,90   ruptured disc  . PERIPHERAL VASCULAR INTERVENTION Right 05/05/2017   Procedure: PERIPHERAL VASCULAR INTERVENTION;  Surgeon: [name], MD;  Location: [facility];  Service: Cardiovascular;  Laterality: Right;  . SCAR REVISION Right 2005   @ amputation  . STUMP REVISION Right 08/21/2018   Procedure: REVISION RIGHT BELOW KNEE AMPUTATION, EXCISION FIBULA;  Surgeon: [name], MD;  Location[facility];  Service: Orthopedics;  Laterality: Right;  . TOE AMPUTATION Right 2005    Family History  Problem Relation Age of Onset  . Cirrhosis Father    Social History:  reports that he quit smoking about 9  months ago. His smoking use included cigarettes. He has Brian 47.00 pack-year smoking history. He has never used smokeless tobacco. He reports that he does not drink alcohol or use drugs.  Allergies:  Allergies  Allergen Reactions  . Varenicline Other (See Comments)    Upset stomach    No medications prior to admission.    Results for orders placed or performed during the hospital encounter of 09/07/18 (from the past 48 hour(s))  SARS Coronavirus 2 (Performed in [place]  hospital lab)     Status: None   Collection Time: 09/07/18  1:24 PM   Specimen: Nasal Swab  Result Value Ref Range   SARS Coronavirus 2 NEGATIVE NEGATIVE    Comment: (NOTE) SARS-CoV-2 target nucleic acids are NOT DETECTED. The SARS-CoV-2 RNA is generally detectable in upper and lower respiratory specimens during the acute phase of infection. Negative results do not preclude SARS-CoV-2 infection, do not rule out co-infections with other pathogens, and should not be used as the sole basis for treatment or other patient management decisions. Negative results must be combined with clinical observations, patient history, and epidemiological information. The expected result is Negative. Fact Sheet for Patients: SugarRoll.be Fact Sheet for Healthcare Providers: https://www.woods-mathews.com/ This test is not yet approved or cleared by the [place] FDA and  has been authorized for detection and/or diagnosis of SARS-CoV-2 by FDA under an Emergency Use Authorization (EUA). This EUA will remain  in effect (meaning this test can be used) for the duration of the COVID-19 declaration under Section 56 4(b)(1) of the Act, 21 U.S.C. section 360bbb-3(b)(1), unless the authorization is terminated or revoked sooner. Performed at [facility], [place] [address] [place],  [address]    No results found.  Review of Systems  All other systems reviewed and are negative.   There were no vitals taken for this visit. Physical Exam  Patient is alert, oriented, no adenopathy, well-dressed, normal affect, normal respiratory effort. The right lateral leg transtibial amputation site is draining moderate yellow-tan drainage.  There is odor.  There is peri-incisional redness.  He has warmth about the knee and painful knee range of motion.  Moderate edema. Wound dehiscence. Assessment/Plan 1. History of right below knee amputation ([place])   2. Dehiscence of  amputation stump ([place])   3. Type 2 diabetes mellitus with diabetic neuropathy, with long-term current use of insulin ([place])   4. Deep vein thrombosis (DVT) of popliteal vein of right lower extremity, unspecified chronicity ([place])     Plan: We will return to the OR later this week for revision of his right transtibial amputation with cultures in the OR. He will follow-up following surgery next week.   Newt Minion, MD 09/09/2018, 6:58 AM

## 2018-09-09 NOTE — Op Note (Signed)
09/09/2018  2:25 PM  PATIENT:  Brian Zavala    PRE-OPERATIVE DIAGNOSIS:  Infected Right Below Knee Amputation  POST-OPERATIVE DIAGNOSIS:  Same  PROCEDURE:  REVISION RIGHT BELOW KNEE AMPUTATION Local tissue rearrangement for wound closure 12 x 5 cm. Application Praveena customizable wound VAC.   SURGEON:  Newt Minion, MD  PHYSICIAN ASSISTANT:None ANESTHESIA:   General  PREOPERATIVE INDICATIONS:  Brian Zavala is a  68 y.o. male with a diagnosis of Infected Right Below Knee Amputation who failed conservative measures and elected for surgical management.    The risks benefits and alternatives were discussed with the patient preoperatively including but not limited to the risks of infection, bleeding, nerve injury, cardiopulmonary complications, the need for revision surgery, among others, and the patient was willing to proceed.  OPERATIVE IMPLANTS: None  @ENCIMAGES @  OPERATIVE FINDINGS: Tissue sent for cultures prior to antibiotics.  OPERATIVE PROCEDURE: Patient brought the operating room and underwent a general anesthetic.  After adequate levels anesthesia were obtained patient's right lower extremity was prepped using DuraPrep draped into a sterile field a timeout was called.  Elliptical incision was made around the nonviable tissue this was carried down to the tibia.  The soft tissue that was involved with the infection was resected in one block of tissue.  This was sent for cultures.  The wound was irrigated with normal saline.  The remaining wound was 12 x 5 cm.  Local tissue rearrangement was used to close the wound with 2-0 nylon.  A Praveena customizable wound VAC was applied this had a good suction fit.  Patient was extubated taken to PACU in stable condition.   DISCHARGE PLANNING:  Antibiotic duration: IV antibiotics Kefzol until cultures finalized  Weightbearing: Nonweightbearing on the right  Pain medication: Opioid pathway  Dressing care/ Wound VAC: Continue  wound VAC for 1 week after discharge  Ambulatory devices: Walker  Discharge to: Anticipate discharge to home.  Follow-up: In the office 1 week post operative.

## 2018-09-09 NOTE — Progress Notes (Signed)
Pt arrived to room 5N20 via stretcher after surgery. Received report from [place], [place] in PACU. See assessment. Will continue to monitor.

## 2018-09-09 NOTE — Anesthesia Preprocedure Evaluation (Addendum)
Anesthesia Evaluation  Patient identified by MRN, date of birth, ID band Patient awake    Reviewed: Allergy & Precautions, NPO status , Patient's Chart, lab work & pertinent test results  Airway Mallampati: II  TM Distance: >3 FB Neck ROM: Full    Dental  (+) Partial Lower, Upper Dentures   Pulmonary COPD, former smoker,    Pulmonary exam normal breath sounds clear to auscultation       Cardiovascular hypertension, Pt. on medications + Peripheral Vascular Disease  Normal cardiovascular exam Rhythm:Regular Rate:Normal     Neuro/Psych  Neuromuscular disease negative psych ROS   GI/Hepatic negative GI ROS, Neg liver ROS,   Endo/Other  diabetes, Type 2, Oral Hypoglycemic Agents, Insulin Dependent  Renal/GU negative Renal ROS     Musculoskeletal  (+) Arthritis ,   Abdominal   Peds  Hematology  (+) Blood dyscrasia (Plavix; Eliquis), ,   Anesthesia Other Findings Day of surgery medications reviewed with the patient.  Reproductive/Obstetrics                            Anesthesia Physical Anesthesia Plan  ASA: II  Anesthesia Plan: General   Post-op Pain Management:    Induction: Intravenous  PONV Risk Score and Plan: 2 and Ondansetron, Midazolam and Diphenhydramine  Airway Management Planned: LMA  Additional Equipment:   Intra-op Plan:   Post-operative Plan: Extubation in OR  Informed Consent: I have reviewed the patients History and Physical, chart, labs and discussed the procedure including the risks, benefits and alternatives for the proposed anesthesia with the patient or authorized representative who has indicated his/her understanding and acceptance.     Dental advisory given  Plan Discussed with: CRNA  Anesthesia Plan Comments:         Anesthesia Quick Evaluation

## 2018-09-10 ENCOUNTER — Encounter (HOSPITAL_COMMUNITY): Payer: Self-pay | Admitting: Orthopedic Surgery

## 2018-09-10 LAB — GLUCOSE, CAPILLARY
Glucose-Capillary: 60 mg/dL — ABNORMAL LOW (ref 70–99)
Glucose-Capillary: 92 mg/dL (ref 70–99)
Glucose-Capillary: 155 mg/dL — ABNORMAL HIGH (ref 70–99)
Glucose-Capillary: 140 mg/dL — ABNORMAL HIGH (ref 70–99)
Glucose-Capillary: 147 mg/dL — ABNORMAL HIGH (ref 70–99)

## 2018-09-10 MED ORDER — INSULIN GLARGINE 100 UNIT/ML ~~LOC~~ SOLN
30.0000 [IU] | Freq: Every day | SUBCUTANEOUS | Status: DC
  Filled 2018-09-10 (×2): qty 0.3

## 2018-09-10 NOTE — Plan of Care (Signed)
Problem: Education: Goal: Knowledge of General Education information will improve Description: Including pain rating scale, medication(s)/side effects and non-pharmacologic comfort measures Outcome: Progressing   Problem: Health Behavior/Discharge Planning: Goal: Ability to manage health-related needs will improve Outcome: Progressing   Problem: Clinical Measurements: Goal: Ability to maintain clinical measurements within normal limits will improve Outcome: Progressing  Goal: Respiratory complications will improve Outcome: Progressing  Goal: Cardiovascular complication will be avoided Outcome: Progressing   Problem: Nutrition: Goal: Adequate nutrition will be maintained Outcome: Progressing   Problem: Coping: Goal: Level of anxiety will decrease Outcome: Progressing   Problem: Elimination: Goal: Will not experience complications related to urinary retention Outcome: Progressing   Problem: Pain Managment: Goal: General experience of comfort will improve Outcome: Progressing   Problem: Safety: Goal: Ability to remain free from injury will improve Outcome: Progressing   Problem: Skin Integrity: Goal: Risk for impaired skin integrity will decrease Outcome: Progressing   

## 2018-09-10 NOTE — Progress Notes (Signed)
Pt's sister, Jyl Heinz, called for update of pt status. RN updated and answered all questions to satisfaction. Will continue to monitor.

## 2018-09-10 NOTE — Evaluation (Signed)
Physical Therapy Evaluation & Discharge Patient Details Name: JEMIAH ELLENBURG MRN: [id number] DOB: Mar 14, 1950 Today's Date: 09/10/2018   History of Present Illness  Pt is a 68 y.o. male admitted 09/09/18 with infection after recent revision of R transtibial amputation (08/21/18). S/p revision R transtibial amputation 7/8. 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    Clinical Impression  Patient evaluated by Physical Therapy with no further acute PT needs identified. PTA, pt mod indep with power wheelchair, lives alone; family lives nearby. Today, pt mod indep with transfer to recliner. Educ on positioning to encourage R knee extension, pt currently unable to tolerate due to pain. All education has been completed and the patient has no further questions. Acute PT is signing off. Thank you for this referral.   Follow Up Recommendations No PT follow up;Supervision - Intermittent    Equipment Recommendations  None recommended by PT    Recommendations for Other Services       Precautions / Restrictions Precautions Precautions: Fall Precaution Comments: RLE wound vac Restrictions Weight Bearing Restrictions: Yes RLE Weight Bearing: Non weight bearing      Mobility  Bed Mobility Overal bed mobility: Independent                Transfers Overall transfer level: Modified independent Equipment used: None Transfers: Squat Pivot Transfers     Squat pivot transfers: Modified independent (Device/Increase time)     General transfer comment: Pt only requiring assist for lines and equipment set up; mod indep to pivot from bed to recliner with UE support  Ambulation/Gait                Stairs            Wheelchair Mobility    Modified Rankin (Stroke Patients Only)       Balance Overall balance assessment: Needs assistance   Sitting balance-Leahy Scale: Good                                        Pertinent Vitals/Pain Pain Assessment: 0-10 Pain Score: 4  Pain Location: R residual limb Pain Descriptors / Indicators: Operative site guarding;Grimacing Pain Intervention(s): Monitored during session;Premedicated before session    Home Living Family/patient expects to be discharged to:: Private residence Living Arrangements: Alone Available Help at Discharge: Family;Available PRN/intermittently Type of Home: House Home Access: Ramped entrance     Home Layout: One level Home Equipment: Walker - 2 wheels;Wheelchair - Engineer, technical sales - power;Shower seat Additional Comments: Sister lives across street and can help if needed    Prior Function Level of Independence: Independent with assistive device(s)         Comments: Uses power w/c for majority of mobility; can stand and hop with RW. Drives.     Hand Dominance        Extremity/Trunk Assessment   Upper Extremity Assessment Upper Extremity Assessment: Overall WFL for tasks assessed    Lower Extremity Assessment Lower Extremity Assessment: RLE deficits/detail RLE Deficits / Details: s/p R BKA revision; knee flexion contracture with extension limited by pain; hip strength/ROM [facility]       Communication   Communication: No difficulties  Cognition Arousal/Alertness: Awake/alert Behavior During Therapy: WFL for tasks assessed/performed Overall Cognitive Status: Within Functional Limits for tasks assessed  General Comments General comments (skin integrity, edema, etc.): Discussed use/charging of portable wound vac, pt had used one after recent admission and familiar. Educ on RLE positioning to encourage knee extension; pt unable to tolerate currently due to pain    Exercises     Assessment/Plan    PT Assessment Patent does not need any further PT services  PT Problem List         PT Treatment Interventions      PT Goals (Current goals can be found in  the Care Plan section)  Acute Rehab PT Goals PT Goal Formulation: All assessment and education complete, DC therapy    Frequency     Barriers to discharge        Co-evaluation               AM-PAC PT "6 Clicks" Mobility  Outcome Measure Help needed turning from your back to your side while in a flat bed without using bedrails?: None Help needed moving from lying on your back to sitting on the side of a flat bed without using bedrails?: None Help needed moving to and from a bed to a chair (including a wheelchair)?: None Help needed standing up from a chair using your arms (e.g., wheelchair or bedside chair)?: None Help needed to walk in hospital room?: A Little Help needed climbing 3-5 steps with a railing? : A Lot 6 Click Score: 21    End of Session   Activity Tolerance: Patient tolerated treatment well Patient left: in chair;with call bell/phone within reach Nurse Communication: Mobility status PT Visit Diagnosis: Other abnormalities of gait and mobility (R26.89)    Time: 5176-1607 PT Time Calculation (min) (ACUTE ONLY): 14 min   Charges:   PT Evaluation $PT Eval Moderate Complexity: [place], PT, DPT Acute Rehabilitation Services  Pager [phone] Office [place] 09/10/2018, 11:56 AM

## 2018-09-10 NOTE — Anesthesia Postprocedure Evaluation (Signed)
Anesthesia Post Note  Patient: Brian Zavala  Procedure(s) Performed: REVISION RIGHT BELOW KNEE AMPUTATION (Right Leg Lower)     Patient location during evaluation: PACU Anesthesia Type: General Level of consciousness: awake and alert Pain management: pain level controlled Vital Signs Assessment: post-procedure vital signs reviewed and stable Respiratory status: spontaneous breathing, nonlabored ventilation, respiratory function stable and patient connected to nasal cannula oxygen Cardiovascular status: blood pressure returned to baseline and stable Postop Assessment: no apparent nausea or vomiting Anesthetic complications: no    Last Vitals:  Vitals:   09/10/18 0001 09/10/18 0400  BP: 120/83 126/76  Pulse: 98 87  Resp: 16 17  Temp: 36.9 C 36.8 C  SpO2: 95% 97%    Last Pain:  Vitals:   09/10/18 0400  TempSrc: Oral  PainSc:                  Catalina Gravel

## 2018-09-10 NOTE — Progress Notes (Signed)
Patient ID: Brian Zavala, male   DOB: 1950-03-16, 68 y.o.   MRN: [id number] Postoperative day 1 revision right transtibial amputation.  Gram stain's are positive for gram-positive cocci.  Patient is on Kefzol.  75 cc in the wound VAC canister.  Patient is sleeping soundly upon awakening states that he has been having bad pain in his buttocks and posterior thigh.

## 2018-09-10 NOTE — Progress Notes (Addendum)
Hypoglycemic Event  CBG: 60  Treatment: 4oz orange juice Zavala  Symptoms: pt asymptomatic  Follow-up CBG: Time: 0830 CBG Result: 92  Possible Reasons for Event: unknown  Comments/MD notified: Caryl Pina in triage at Brian Given, MD office stated she would notify Brian Given, MD  New orders to follow.  Will continue to monitor.     Racheal Patches, RN

## 2018-09-10 NOTE — Progress Notes (Signed)
RN attempted to call and update pt's sister, Jyl Heinz, of pt status. Unsuccessful at this time. Will continue to monitor.

## 2018-09-11 ENCOUNTER — Inpatient Hospital Stay: Payer: Self-pay

## 2018-09-11 DIAGNOSIS — Z888 Allergy status to other drugs, medicaments and biological substances status: Secondary | ICD-10-CM

## 2018-09-11 DIAGNOSIS — T8743 Infection of amputation stump, right lower extremity: Principal | ICD-10-CM

## 2018-09-11 DIAGNOSIS — Z8739 Personal history of other diseases of the musculoskeletal system and connective tissue: Secondary | ICD-10-CM

## 2018-09-11 DIAGNOSIS — Z89511 Acquired absence of right leg below knee: Secondary | ICD-10-CM

## 2018-09-11 DIAGNOSIS — Z978 Presence of other specified devices: Secondary | ICD-10-CM

## 2018-09-11 DIAGNOSIS — E1142 Type 2 diabetes mellitus with diabetic polyneuropathy: Secondary | ICD-10-CM

## 2018-09-11 DIAGNOSIS — T8149XA Infection following a procedure, other surgical site, initial encounter: Secondary | ICD-10-CM

## 2018-09-11 DIAGNOSIS — Z87891 Personal history of nicotine dependence: Secondary | ICD-10-CM

## 2018-09-11 DIAGNOSIS — B9561 Methicillin susceptible Staphylococcus aureus infection as the cause of diseases classified elsewhere: Secondary | ICD-10-CM

## 2018-09-11 LAB — GLUCOSE, CAPILLARY
Glucose-Capillary: 220 mg/dL — ABNORMAL HIGH (ref 70–99)
Glucose-Capillary: 220 mg/dL — ABNORMAL HIGH (ref 70–99)

## 2018-09-11 MED ORDER — OXYCODONE-ACETAMINOPHEN 5-325 MG PO TABS: 1.0000 | ORAL_TABLET | Freq: Four times a day (QID) | ORAL | 0 refills | Status: DC | PRN

## 2018-09-11 MED ORDER — SODIUM CHLORIDE 0.9% FLUSH: 10.0000 mL | INTRAVENOUS | Status: DC | PRN

## 2018-09-11 MED ORDER — CEFAZOLIN IV (FOR PTA / DISCHARGE USE ONLY): 2.0000 g | Freq: Three times a day (TID) | INTRAVENOUS | 0 refills | Status: DC

## 2018-09-11 NOTE — Plan of Care (Signed)

## 2018-09-11 NOTE — Progress Notes (Signed)
Pt given discharge instructions and gone over with him. Pt verbalized understanding. Wound vac switched to prevena. All belongings gathered to be sent home.

## 2018-09-11 NOTE — Progress Notes (Signed)
PHARMACY CONSULT NOTE FOR:  OUTPATIENT  PARENTERAL ANTIBIOTIC THERAPY (OPAT)  Indication: Osteomyelitis Regimen: Cefazolin 2 gm Q 8 hours End date: 10/21/2018  IV antibiotic discharge orders are pended. To discharging provider:  please sign these orders via discharge navigator,  Select New Orders & click on the button choice - Manage This Unsigned Work.     Thank you for allowing pharmacy to be a part of this patient's care.  Jimmy Footman, PharmD, BCPS, BCIDP Infectious Diseases Clinical Pharmacist Phone: [phone] 09/11/2018, 10:46 AM

## 2018-09-11 NOTE — Discharge Instructions (Signed)
Keep Prevena VAC machine plugged into wall outlet as much as possible to keep charged.  °

## 2018-09-11 NOTE — Consult Note (Signed)
[name] for Infectious Disease    Date of Admission:  09/09/2018           Day 3 cefazolin       Reason for Consult: Persistent MSSA right BKA stump infection    Referring Provider: Dr. Meridee Zavala Primary Care Provider: Dr. Thressa Zavala  Assessment: I am not certain that he has any residual osteomyelitis but I am concerned about that possibility.  I talked to him about treatment options including extended oral antibiotic therapy versus IV antibiotic therapy.  We decided to treat him with IV cefazolin.  He works with the [facility] who can administer his outpatient IV cefazolin.  Plan: 1. PICC placement 2. Continue cefazolin 3. We will arrange follow-up in my clinic in 4 to 6 weeks  Diagnosis: Wound infection  Culture Result: MSSA  Allergies  Allergen Reactions  . Varenicline Other (See Comments)    Upset stomach    OPAT Orders Discharge antibiotics: Per pharmacy protocol cefazolin (please fax orders to Dr. Thressa Zavala at [facility] [phone])  Duration: 6 weeks End Date: 10/21/2018  [facility] Care Per Protocol:  Labs weekly while on IV antibiotics: _x_ CBC with differential _x_ BMP __ CMP _x_ CRP _x_ ESR __ Vancomycin trough __ CK  _x_ Please pull PIC at completion of IV antibiotics __ Please leave PIC in place until doctor has seen patient or been notified  Fax weekly labs to [phone]  Clinic Follow Up Appt: 10/15/2018  Principal Problem:   Dehiscence of amputation stump ([place]) Active Problems:   Subacute osteomyelitis of right fibula ([place])   Below-knee amputation of right lower extremity ([place])   Diabetes mellitus, insulin dependent (IDDM), controlled ([place])   Type 2 diabetes mellitus with diabetic neuropathy, with long-term current use of insulin (HCC)   Scheduled Meds: . aspirin  81 mg Oral Daily  . atorvastatin  80 mg Oral q1800  . clopidogrel  75 mg Oral Q breakfast  . docusate sodium  100 mg Oral BID  .  furosemide  20 mg Oral Daily  . hydrochlorothiazide  12.5 mg Oral Daily  . insulin aspart  0-15 Units Subcutaneous TID WC  . insulin aspart  4 Units Subcutaneous TID WC  . insulin glargine  30 Units Subcutaneous QHS  . leflunomide  20 mg Oral Daily  . metFORMIN  1,000 mg Oral BID WC  . morphine  100 mg Oral Q12H  . predniSONE  2 mg Oral Q breakfast  . pregabalin  150 mg Oral QHS  . pregabalin  75 mg Oral Daily   Continuous Infusions: . sodium chloride 10 mL/hr at 09/10/18 1800  .  ceFAZolin (ANCEF) IV 1 g (09/11/18 0708)  . methocarbamol (ROBAXIN) IV     PRN Meds:.acetaminophen, bisacodyl, HYDROmorphone (DILAUDID) injection, magnesium citrate, methocarbamol **OR** methocarbamol (ROBAXIN) IV, metoCLOPramide **OR** metoCLOPramide (REGLAN) injection, ondansetron **OR** ondansetron (ZOFRAN) IV, oxyCODONE, oxyCODONE, polyethylene glycol  HPI: Brian Zavala is a 68 y.o. male with diabetes and peripheral neuropathy.  He underwent right BKA in 2005.  He developed fibular osteomyelitis and underwent revision in January of last year.  I saw him last fall when Dr. Dossie Zavala had noted that a small piece of bone had extruded from his stump incision.  CT scan at that time did not reveal any evidence of osteomyelitis.  Last month the incision became inflamed and started draining.  He was hospitalized and went to  incision and drainage and excision of his fibular remnant which was grossly infected.  No bone was sent for pathology or culture.  He thinks he was on oral doxycycline postoperatively.  He had persistent drainage leading to his latest revision on 09/09/2018.  The operative note indicates that the debridement went down to his tibia.  Operative cultures have grown MSSA.   Review of Systems: Review of Systems  Constitutional: Negative for chills, diaphoresis and fever.  Gastrointestinal: Negative for abdominal pain, diarrhea, nausea and vomiting.  Musculoskeletal: Positive for joint pain.     Past Medical History:  Diagnosis Date  . Arthritis    "pretty much all over"  . Cellulitis of left foot   . COPD (chronic obstructive pulmonary disease) ([place])   . Dyspnea   . Hyperlipidemia   . Hypertension   . Neuromuscular disorder (HCC)    neuropathy legs  . Neuropathy    legs  . Osteomyelitis ([place]) 04/2018   4th toe left foot  . Osteomyelitis of fourth toe of left foot ([place]) 04/19/2018  . Peripheral vascular disease ([place])   . Type II diabetes mellitus ([place])   . Wears glasses   . Wears partial dentures    top and bottom partials    Social History   Tobacco Use  . Smoking status: Former Smoker    Packs/day: 1.00    Years: 47.00    Pack years: 47.00    Types: Cigarettes    Quit date: 12/2017    Years since quitting: 0.7  . Smokeless tobacco: Never Used  Substance Use Topics  . Alcohol use: No  . Drug use: No    Family History  Problem Relation Age of Onset  . Cirrhosis Father    Allergies  Allergen Reactions  . Varenicline Other (See Comments)    Upset stomach    OBJECTIVE: Blood pressure (!) 150/96, pulse (!) 102, temperature 98.1 F (36.7 C), temperature source Oral, resp. rate 16, height 6' 0.99" (1.854 m), weight 81.6 kg, SpO2 97 %.  Physical Exam Constitutional:      Comments: He is pleasant and in no distress resting quietly in bed.  Musculoskeletal:     Comments: He has a VAC wound dressing on his right BKA stump wound.  Psychiatric:        Mood and Affect: Mood normal.     Lab Results Lab Results  Component Value Date   WBC 9.8 09/09/2018   HGB 13.4 09/09/2018   HCT 40.9 09/09/2018   MCV 85.9 09/09/2018   PLT 349 09/09/2018    Lab Results  Component Value Date   CREATININE 0.68 09/09/2018   BUN 8 09/09/2018   NA 134 (L) 09/09/2018   K 4.0 09/09/2018   CL 98 09/09/2018   CO2 27 09/09/2018    Lab Results  Component Value Date   ALT 18 04/19/2018   AST 18 04/19/2018   ALKPHOS 51 04/19/2018   BILITOT 0.6 04/19/2018      Microbiology: Recent Results (from the past 240 hour(s))  SARS Coronavirus 2 (Performed in [place] hospital lab)     Status: None   Collection Time: 09/07/18  1:24 PM   Specimen: Nasal Swab  Result Value Ref Range Status   SARS Coronavirus 2 NEGATIVE NEGATIVE Final    Comment: (NOTE) SARS-CoV-2 target nucleic acids are NOT DETECTED. The SARS-CoV-2 RNA is generally detectable in upper and lower respiratory specimens during the acute phase of infection. Negative results do not preclude SARS-CoV-2  infection, do not rule out co-infections with other pathogens, and should not be used as the sole basis for treatment or other patient management decisions. Negative results must be combined with clinical observations, patient history, and epidemiological information. The expected result is Negative. Fact Sheet for Patients: SugarRoll.be Fact Sheet for Healthcare Providers: https://www.woods-mathews.com/ This test is not yet approved or cleared by the [place] FDA and  has been authorized for detection and/or diagnosis of SARS-CoV-2 by FDA under an Emergency Use Authorization (EUA). This EUA will remain  in effect (meaning this test can be used) for the duration of the COVID-19 declaration under Section 56 4(b)(1) of the Act, 21 U.S.C. section 360bbb-3(b)(1), unless the authorization is terminated or revoked sooner. Performed at [facility], [place] [address] [place], [place] [address]   Aerobic/Anaerobic Culture (surgical/deep wound)     Status: None (Preliminary result)   Collection Time: 09/09/18  1:49 PM   Specimen: Soft Tissue, Other  Result Value Ref Range Status   Specimen Description TISSUE  Final   Special Requests SOFT TISSUE LEG  Final   Gram Stain   Final    ABUNDANT WBC PRESENT,BOTH PMN AND MONONUCLEAR FEW GRAM POSITIVE COCCI Performed at [facility], [address], [place], [place] [address]    Culture  MODERATE STAPHYLOCOCCUS AUREUS  Final   Report Status PENDING  Incomplete   Organism ID, Bacteria STAPHYLOCOCCUS AUREUS  Final      Susceptibility   Staphylococcus aureus - MIC*    CIPROFLOXACIN <=0.5 SENSITIVE Sensitive     ERYTHROMYCIN <=0.25 SENSITIVE Sensitive     GENTAMICIN <=0.5 SENSITIVE Sensitive     OXACILLIN <=0.25 SENSITIVE Sensitive     TETRACYCLINE <=1 SENSITIVE Sensitive     VANCOMYCIN <=0.5 SENSITIVE Sensitive     TRIMETH/SULFA <=10 SENSITIVE Sensitive     CLINDAMYCIN <=0.25 SENSITIVE Sensitive     RIFAMPIN <=0.5 SENSITIVE Sensitive     Inducible Clindamycin NEGATIVE Sensitive     * MODERATE STAPHYLOCOCCUS AUREUS    Michel Bickers, MD [facility] for Infectious [place] [phone] pager   [phone] cell 09/11/2018, 10:48 AM

## 2018-09-11 NOTE — TOC Transition Note (Signed)
Transition of Care [facility]) - CM/SW Discharge Note   Patient Details  Name: Brian Zavala MRN: [id number] Date of Birth: 1950/07/15  [facility] CM/SW Contact:  Ninfa Meeker, RN Phone Number  [phone] (working remotely) 09/11/18 12:05  Clinical Narrative:   68 yr old gentleman s/p revision of right transtibial amputation secondary to persistent MSSA of right BKA.. Patient will go home with PICC line for IV antibiotics- Ancef until 10/21/18. Patient is with [facility], Case manager spoke with Webb Silversmith, social worker: [phone], and was informed that [facility] contracts with [place] [facility] and they will provide [facility] services. Case manager called referral to Neoma Laming. Patient says he lives alone, his mom will pick him up at discharge, and his sister is an Therapist, sports who will help as needed. He has an IT trainer wheelchair and ramp access to his home.      Final next level of care: [place] Barriers to Discharge: No Barriers Identified   Patient Goals and CMS Choice Patient states their goals for this hospitalization and ongoing recovery are:: to get better   Choice offered to / list presented to : NA([facility] contrated with [facility])  Discharge Placement                       Discharge Plan and Services   Discharge Planning Services: CM Consult Post Acute Care Choice: Home Health          DME Arranged: N/A         HH Arranged: RN [place] Agency: [place] ([facility]), [employer] Date [place]: 09/11/18 Time [place]: 2947 Representative spoke with at [place]: Neoma Laming, [facility] liaison ,  Carolynn Sayers, [employer] liaison  Social Determinants of Health (SDOH) Interventions     Readmission Risk Interventions No flowsheet data found.

## 2018-09-11 NOTE — Care Management Important Message (Signed)
Important Message  Patient Details  Name: Brian Zavala MRN: [id number] Date of Birth: 08/01/1950   Medicare Important Message Given:  Yes     [name] 09/11/2018, 3:18 PM

## 2018-09-11 NOTE — Discharge Summary (Signed)
Discharge Diagnoses:  Principal Problem:   Dehiscence of amputation stump ([place]) Active Problems:   Diabetes mellitus, insulin dependent (IDDM), controlled ([place])   Type 2 diabetes mellitus with diabetic neuropathy, with long-term current use of insulin ([place])   Subacute osteomyelitis of right fibula ([place])   Below-knee amputation of right lower extremity ([place])   Surgeries: Procedure(s): REVISION RIGHT BELOW KNEE AMPUTATION on 09/09/2018    Consultants:   Discharged Condition: Improved  Hospital Course: Brian Zavala is an 68 y.o. male who was admitted 09/09/2018 with a chief complaint of infection of right transtibial amputation site, with a final diagnosis of Infected Right Below Knee Amputation.  Patient was brought to the operating room on 09/09/2018 and underwent Procedure(s): REVISION RIGHT BELOW KNEE AMPUTATION.    Patient was given perioperative antibiotics:  Anti-infectives (From admission, onward)   Start     Dose/Rate Route Frequency Ordered Stop   09/11/18 0000  ceFAZolin (ANCEF) IVPB     2 g Intravenous Every 8 hours 09/11/18 1524 10/21/18 2359   09/09/18 2200  ceFAZolin (ANCEF) IVPB 1 g/50 mL premix     1 g 100 mL/hr over 30 Minutes Intravenous Every 8 hours 09/09/18 1547 09/14/18 2159   09/09/18 1330  ceFAZolin (ANCEF) 2-4 GM/100ML-% IVPB    Note to Pharmacy: Sampson Si   : cabinet override      09/09/18 1330 09/10/18 0144    .  Patient was given sequential compression devices, early ambulation, and aspirin for DVT prophylaxis.  Recent vital signs:  Patient Vitals for the past 24 hrs:  BP Temp Temp src Pulse Resp SpO2  09/11/18 1312 129/81 98.7 F (37.1 C) Oral 98 16 100 %  09/11/18 0738 (!) 150/96 98.1 F (36.7 C) Oral (!) 102 16 97 %  09/11/18 0308 (!) 132/95 (!) 97.5 F (36.4 C) Oral 87 17 99 %  09/10/18 1950 130/85 99.6 F (37.6 C) Oral 91 18 98 %  09/10/18 1610 130/89 98.5 F (36.9 C) Oral 87 17 98 %  .  Recent laboratory studies: [place] Ekg Site  Rite  Result Date: 09/11/2018 If [facility] image not attached, placement could not be confirmed due to current cardiac rhythm.   Discharge Medications:   Allergies as of 09/11/2018      Reactions   Varenicline Other (See Comments)   Upset stomach      Medication List    TAKE these medications   aspirin 81 MG chewable tablet Chew 81 mg by mouth daily.   atorvastatin 80 MG tablet Commonly known as: LIPITOR Take 1 tablet (80 mg total) by mouth daily at 6 PM.   calcium carbonate 1500 (600 Ca) MG Tabs tablet Commonly known as: OSCAL Take 600 mg of elemental calcium by mouth daily.   ceFAZolin  IVPB Commonly known as: ANCEF Inject 2 g into the vein every 8 (eight) hours. Indication:  Osteomyelitis Last Day of Therapy:  10/21/2018 Labs - Once weekly:  CBC/D and BMP, Labs - Every other week:  ESR and CRP   cephALEXin 500 MG capsule Commonly known as: KEFLEX Take 500 mg by mouth 4 (four) times daily.   cholecalciferol 1000 units tablet Commonly known as: VITAMIN D Take 1,000 Units by mouth daily.   clopidogrel 75 MG tablet Commonly known as: PLAVIX Take 1 tablet (75 mg total) by mouth daily with breakfast.   docusate sodium 100 MG capsule Commonly known as: COLACE Take 100 mg by mouth 2 (two) times daily.   Eliquis  5 MG Tabs tablet Generic drug: apixaban Take 5 mg by mouth 2 (two) times daily.   etanercept 50 MG/ML injection Commonly known as: ENBREL Inject 50 mg into the skin every _0 /10/20 1524          Diagnostic Studies: Xr Tibia/fibula Right  Result Date: 08/18/2018 Radiographs of the right tibia show changes about the residual fibular head likely consistent with osteomyelitis, calcification of the popliteal  and distal femoral vessels.   Vas [place] Lower Extremity Venous (dvt)  Result Date: 08/17/2018  Lower Venous Study Indications: Pain, and Swelling.  Risk Factors: Surgery Right BKA Dr Sharol Given 03/2017. Performing Technologist: Ralene Cork RVT  Examination Guidelines: A complete evaluation includes B-mode imaging, spectral Doppler, color Doppler, and power Doppler as needed of all accessible portions of each vessel. Bilateral testing is considered an integral part of a complete examination. Limited examinations for reoccurring indications may be performed as noted.  +---------+---------------+---------+-----------+----------+-------------------+ RIGHT    CompressibilityPhasicitySpontaneityPropertiesSummary             +---------+---------------+---------+-----------+----------+-------------------+ CFV      Full           No       Yes                                      +---------+---------------+---------+-----------+----------+-------------------+ SFJ      Full                    Yes                                      +---------+---------------+---------+-----------+----------+-------------------+ FV Prox  Full           Yes      Yes                   phasic with deep                                                          inspiration         +---------+---------------+---------+-----------+----------+-------------------+ FV Mid   Full           Yes      Yes                  phasic with deep                                                          inspiration         +---------+---------------+---------+-----------+----------+-------------------+ FV DistalPartial        No       No                                       +---------+---------------+---------+-----------+----------+-------------------+ POP      None           No                                                +---------+---------------+---------+-----------+----------+-------------------+ BKA  Findings reported to Dorian Pod. MD at 3:45pm.  Summary: Right: Findings consistent with acute deep vein thrombosis involving the right distal femoral vein, and right  popliteal vein. Right popliteal artery velocity of 13 cm/s with monophasic waveform. Recommend patient be seen by Dr. Sharol Given.  *See table(s) above for measurements and observations. Electronically signed by Harold Barban MD on 08/17/2018 at 4:04:43 PM.    Final    [place] Ekg Site Rite  Result Date: 09/11/2018 If Site Rite image not attached, placement could not be confirmed due to current cardiac rhythm.   Patient benefited maximally from their hospital stay and there were no complications.     Disposition: Discharge disposition: 01-Home or Self Care      Discharge Instructions    Call MD / Call 911   Complete by: As directed    If you experience chest pain or shortness of breath, CALL 911 and be transported to the hospital emergency room.  If you develope a fever above 101 F, pus (white drainage) or increased drainage or redness at the wound, or calf pain, call your surgeon's office.   Care order/instruction:   Complete by: As directed    DC with Prevena VAC machine and instruct to  keep plugged into wall outlet as much as possible.   Constipation Prevention   Complete by: As directed    Drink plenty of fluids.  Prune juice may be helpful.  You may use a stool softener, such as Colace (over the counter) 100 mg twice a day.  Use MiraLax (over the counter) for constipation as needed.   Diet - low sodium heart healthy   Complete by: As directed    Home infusion instructions [facility] May follow [place] Dosing Protocol; May administer Cathflo as needed to maintain patency of vascular access device.; Flushing of vascular access device: per [facility] Protocol: 0.9% NaCl pre/post medica...   Complete by: As directed    Instructions: May follow [address] Dosing Protocol   Instructions: May administer Cathflo as needed to maintain patency of vascular access device.   Instructions: Flushing of vascular access device: per [facility] Protocol: 0.9% NaCl pre/post medication administration and prn patency; Heparin 100 u/ml, 85m for implanted ports and Heparin 10u/ml, 522mfor all other central venous catheters.   Instructions: May follow [facility] Anaphylaxis Protocol for First Dose Administration in the home: 0.9% NaCl at 25-50 ml/hr to maintain IV access for protocol meds. Epinephrine 0.3 ml IV/IM PRN and Benadryl 25-50 IV/IM PRN s/s of anaphylaxis.   Instructions: [facility] Coordinator (RN) to assist per patient IV care needs in the home PRN.   Increase activity slowly as tolerated   Complete by: As directed      Follow-up Information    DuNewt MinionMD In 1 week.   Specialty: Orthopedic Surgery Contact information: [address] [phone]      Health, [facility]-Home Follow up.   Specialty: Home Health Services Why: A representative from [facility] will contact you concerning delivery for your IV antiobiotics.       [facility] .        [facility].    Why: [facility] will contact you to arrange start time for Home Health RN for IV antibiotics.  Contact information: [phone]          Signed: SHErlinda HongPA-C 09/11/2018, 3:28 PM  [phone]

## 2018-09-11 NOTE — Progress Notes (Signed)
Subjective: 2 Days Post-Op Procedure(s) (LRB): REVISION RIGHT BELOW KNEE AMPUTATION (Right) Patient reports pain as mild.  Reports pain over the back and posterior leg improved today.   Objective: Vital signs in last 24 hours: Temp:  [97.5 F (36.4 C)-99.6 F (37.6 C)] 98.1 F (36.7 C) (07/10 0738) Pulse Rate:  [87-104] 102 (07/10 0738) Resp:  [16-18] 16 (07/10 0738) BP: (130-150)/(73-96) 150/96 (07/10 0738) SpO2:  [97 %-100 %] 97 % (07/10 0738)  Intake/Output from previous day: 07/09 0701 - 07/10 0700 In: 874.7 [P.O.:720; I.V.:104.7; IV Piggyback:50] Out: 2465 [Urine:2450; Drains:15] Intake/Output this shift: No intake/output data recorded.  Recent Labs    09/09/18 1047  HGB 13.4   Recent Labs    09/09/18 1047  WBC 9.8  RBC 4.76  HCT 40.9  PLT 349   Recent Labs    09/09/18 1047  NA 134*  K 4.0  CL 98  CO2 27  BUN 8  CREATININE 0.68  GLUCOSE 121*  CALCIUM 8.8*   Recent Labs    09/09/18 1047  INR 1.1    VAC dressing intact and functioning well over the right transtibial amputation site. Total of ~ 75 cc drainage in the canister.    Assessment/Plan: 2 Days Post-Op Procedure(s) (LRB): REVISION RIGHT BELOW KNEE AMPUTATION (Right) Continue VAC therapy.  ID seeing for Staph a. on operative cultures and planning PICC and IV antibiotics  Plan DC home once PICC placed and orders for IV antibiotics  SHAWN RAYBURN, PA-C 09/11/2018, 8:41 AM  [employer] [phone]

## 2018-09-11 NOTE — Progress Notes (Signed)
Peripherally Inserted Central Catheter/Midline Placement  The IV Nurse has discussed with the patient and/or persons authorized to consent for the patient, the purpose of this procedure and the potential benefits and risks involved with this procedure.  The benefits include less needle sticks, lab draws from the catheter, and the patient may be discharged home with the catheter. Risks include, but not limited to, infection, bleeding, blood clot (thrombus formation), and puncture of an artery; nerve damage and irregular heartbeat and possibility to perform a PICC exchange if needed/ordered by physician.  Alternatives to this procedure were also discussed.  Bard Power PICC patient education guide, fact sheet on infection prevention and patient information card has been provided to patient /or left at bedside.  Inserted by Colin Ina, RN   PICC/Midline Placement Documentation  PICC Single Lumen 09/11/18 PICC Right Brachial 42 cm 0 cm (Active)  Indication for Insertion or Continuance of Line Home intravenous therapies (PICC only) 09/11/18 1522  Exposed Catheter (cm) 0 cm 09/11/18 1522  Site Assessment Clean;Dry;Intact 09/11/18 1522  Line Status Flushed;Saline locked;Blood return noted 09/11/18 1522  Dressing Type Transparent 09/11/18 1522  Dressing Status Clean;Dry;Intact 09/11/18 1522  Dressing Intervention New dressing 09/11/18 1522  Dressing Change Due 09/18/18 09/11/18 1522       C[place]ren, Nicolette Bang 09/11/2018, 3:23 PM

## 2018-09-14 ENCOUNTER — Telehealth: Payer: Self-pay | Admitting: Orthopedic Surgery

## 2018-09-14 ENCOUNTER — Encounter: Payer: Self-pay | Admitting: Internal Medicine

## 2018-09-14 NOTE — Telephone Encounter (Signed)
Orders was given for Brian Zavala for patient, she verbally understood.

## 2018-09-14 NOTE — Telephone Encounter (Signed)
Levada Dy /[facility]/RN need verbal orders for Nursing Labs and wound care  1 week 1 2 week 8 weeks  [phone]

## 2018-09-16 ENCOUNTER — Ambulatory Visit (INDEPENDENT_AMBULATORY_CARE_PROVIDER_SITE_OTHER): Payer: Medicare (Managed Care) | Admitting: Family

## 2018-09-16 ENCOUNTER — Encounter: Payer: Self-pay | Admitting: Family

## 2018-09-16 ENCOUNTER — Inpatient Hospital Stay: Payer: Medicare (Managed Care) | Admitting: Family

## 2018-09-16 ENCOUNTER — Other Ambulatory Visit: Payer: Self-pay

## 2018-09-16 DIAGNOSIS — M86261 Subacute osteomyelitis, right tibia and fibula: Secondary | ICD-10-CM

## 2018-09-16 DIAGNOSIS — Z89519 Acquired absence of unspecified leg below knee: Secondary | ICD-10-CM

## 2018-09-16 NOTE — Progress Notes (Signed)
Post-Op Visit Note   Patient: Brian Zavala           Date of Birth: 1951/01/26           MRN: [id number] Visit Date: 09/16/2018 PCP: [facility] [place]  Chief Complaint:  Chief Complaint  Patient presents with  . Right Leg - Follow-up    Canister from wound vac showed blockage    HPI:  HPI The patient is a 68 year old gentleman who presents today complaining that his wound VAC has not been draining properly showing a blockage.  He is status post revision of his below the knee amputation on the right on the eighth of last week. Ortho Exam Incision approximated with sutures.  There was a large clot laying on top of incision blocking the wound VAC.  There is no active drainage no surrounding erythema slight maceration.  No concerning sign for active infection.  Visit Diagnoses:  1. Subacute osteomyelitis of right fibula ([place])   2. S/P unilateral BKA (below knee amputation) ([place])     Plan: He will follow-up in the office in 1 week.  Begin daily Dial soap cleansing dry dressing changes he is to wear his shrinker for compression.  Follow-Up Instructions: Return in about 1 week (around 09/23/2018).   Imaging: No results found.  Orders:  No orders of the defined types were placed in this encounter.  No orders of the defined types were placed in this encounter.    PMFS History: Patient Active Problem List   Diagnosis Date Noted  . Below-knee amputation of right lower extremity ([place]) 09/09/2018  . Dehiscence of amputation stump ([place]) 08/18/2018  . H/O amputation of lesser toe, left ([place]) 05/06/2018  . Peripheral vascular disease ([place]) 04/19/2018  . Diabetic foot infection ([place]) 04/18/2018  . Tobacco abuse counseling   . Critical lower limb ischemia 05/02/2017  . Subacute osteomyelitis of right fibula ([place])   . 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, right, sequela ([place]) 02/03/2017  . Diabetes mellitus, insulin dependent (IDDM),  controlled ([place])   . Type 2 diabetes mellitus with diabetic neuropathy, with long-term current use of insulin ([place])   . S/P unilateral BKA (below knee amputation) ([place])   . Rheumatoid arthritis ([place])   . Gallstones 09/23/2013  . Non-intractable cyclical vomiting with nausea 09/23/2013  . Epidermal inclusion cyst 11/30/2012   Past Medical History:  Diagnosis Date  . Arthritis    "pretty much all over"  . Cellulitis of left foot   . COPD (chronic obstructive pulmonary disease) ([place])   . Dyspnea   . Hyperlipidemia   . Hypertension   . Neuromuscular disorder (H[facility])    neuropathy legs  . Neuropathy    legs  . Osteomyelitis ([place]) 04/2018   4th toe left foot  . Osteomyelitis of fourth toe of left foot ([place]) 04/19/2018  . Peripheral vascular disease ([place])   . Type II diabetes mellitus ([place])   . Wears glasses   . Wears partial dentures    top and bottom partials    Family History  Problem Relation Age of Onset  . Cirrhosis Father     Past Surgical History:  Procedure Laterality Date  . AMPUTATION Left 04/22/2018   Procedure: LEFT FOOT 4TH RAY AMPUTATION;  Surgeon: Newt Minion, MD;  Location: [facility];  Service: Orthopedics;  Laterality: Left;  . BACK SURGERY    . CHOLECYSTECTOMY N/A 10/26/2013   Procedure: LAPAROSCOPIC CHOLECYSTECTOMY WITH INTRAOPERATIVE CHOLANGIOGRAM;  Surgeon:  Thomas A. Cornett, MD;  Location: [facility] OR;  Service: General;  Laterality: N/A;  . COLONOSCOPY    . CYST EXCISION Bilateral 02/15/2015   Procedure: LEFT INDEX FINGER AND RIGHT MIDDLE FINGER NODULE EXCISION;  Surgeon: Tarry Kos, MD;  Location: [facility];  Service: Orthopedics;  Laterality: Bilateral;  . CYST EXCISION PERINEAL N/A 12/08/2012   Procedure: CYST EXCISION PERINeum;  Surgeon: Maisie Fus A. Cornett, MD;  Location: [facility];  Service: General;  Laterality: N/A;  . FOOT AMPUTATION Right 2005  . I&D EXTREMITY Right 03/07/2017   Procedure: EXCISION FIBULAR HEAD RIGHT BELOW  KNEE AMPUTATION;  Surgeon: [name], MD;  Location: [facility];  Service: Orthopedics;  Laterality: Right;  . INCISE AND DRAIN ABCESS  12/02/2014   PERINEAL ABSCESS  . INCISION AND DRAINAGE PERIRECTAL ABSCESS Left 12/02/2014   Procedure: IRRIGATION AND DEBRIDEMENT PERINEAL ABSCESS;  Surgeon: Abigail Miyamoto, MD;  Location: [facility];  Service: General;  Laterality: Left;  . LEG AMPUTATION BELOW KNEE Right 2005  . LOWER EXTREMITY ANGIOGRAPHY N/A 05/05/2017   Procedure: LOWER EXTREMITY ANGIOGRAPHY;  Surgeon: [name], MD;  [facility] LAB;  Service: Cardiovascular;  Laterality: N/A;  . LUMBAR DISC SURGERY  82,90   ruptured disc  . PERIPHERAL VASCULAR INTERVENTION Right 05/05/2017   Procedure: PERIPHERAL VASCULAR INTERVENTION;  Surgeon: [name], MD;  Location: [facility] INVASIVE CV LAB;  Service: Cardiovascular;  Laterality: Right;  . SCAR REVISION Right 2005   @ amputation  . STUMP REVISION Right 08/21/2018   Procedure: REVISION RIGHT BELOW KNEE AMPUTATION, EXCISION FIBULA;  Surgeon: [name], MD;  Location: [facility];  Service: Orthopedics;  Laterality: Right;  . STUMP REVISION Right 09/09/2018   Procedure: REVISION RIGHT BELOW KNEE AMPUTATION;  Surgeon: [name], MD;  Location: [facility] OR;  Service: Orthopedics;  Laterality: Right;  . TOE AMPUTATION Right 2005   Social History   Occupational History  . Not on file  Tobacco Use  . Smoking status: Former Smoker    Packs/day: 1.00    Years: 47.00    Pack years: 47.00    Types: Cigarettes    Quit date: 12/2017    Years since quitting: 0.7  . Smokeless tobacco: Never Used  Substance and Sexual Activity  . Alcohol use: No  . Drug use: No  . Sexual activity: Never

## 2018-09-17 ENCOUNTER — Telehealth: Payer: Self-pay

## 2018-09-17 NOTE — Telephone Encounter (Signed)
Beth with [facility] called to verify verbal orders for patient.  Advised Beth per Junie Panning Z.that patient is to Begin daily Dial soap cleansing dry dressing changes he is to wear his shrinker for compression.

## 2018-09-18 LAB — AEROBIC/ANAEROBIC CULTURE W GRAM STAIN (SURGICAL/DEEP WOUND)

## 2018-09-21 ENCOUNTER — Telehealth: Payer: Self-pay

## 2018-09-21 NOTE — Telephone Encounter (Signed)
[facility] nurse called stating that patient has a little opening on the edge of his right stump.  Stated that its' not bad, but she can see that the incision has opened more than it was last week.  A little drainage, but no odor or redness.  Patient had right BKA on Wednesday, 09/09/2018.   Stated that she was just giving an update.   CB# [phone].  Thank you.

## 2018-09-22 NOTE — Telephone Encounter (Signed)
Pt has an appt on Thursday for eval.

## 2018-09-24 ENCOUNTER — Encounter: Payer: Self-pay | Admitting: Physician Assistant

## 2018-09-24 ENCOUNTER — Ambulatory Visit (INDEPENDENT_AMBULATORY_CARE_PROVIDER_SITE_OTHER): Payer: Medicare (Managed Care) | Admitting: Orthopedic Surgery

## 2018-09-24 VITALS — Ht 72.99 in | Wt 180.0 lb

## 2018-09-24 DIAGNOSIS — Z89511 Acquired absence of right leg below knee: Secondary | ICD-10-CM

## 2018-09-24 DIAGNOSIS — T8781 Dehiscence of amputation stump: Secondary | ICD-10-CM

## 2018-09-28 ENCOUNTER — Encounter: Payer: Self-pay | Admitting: Orthopedic Surgery

## 2018-09-28 NOTE — Progress Notes (Signed)
Office Visit Note   Patient: Brian Zavala           Date of Birth: [birth year]           MRN: [id number] Visit Date: 09/24/2018              Requested by: [employer], [facility] [address] [place],  [place] [address] PCP: Inc, [facility]  Chief Complaint  Patient presents with  . Right Leg - Routine Post Op    09/09/2018 Revision BKA      HPI: Patient is a 68 year old gentleman who presents status post revision right transtibial amputation with excision of the fibula.  Patient complains of increasing drainage some wound dehiscence.  Assessment & Plan: Visit Diagnoses:  1. History of right below knee amputation (HCC)   2. Dehiscence of amputation stump (HCC)     Plan: Patient does have clear drainage and is currently on a PICC line for IV antibiotics patient states he will follow-up with the wound center for evaluation for hyperbaric treatment.  Follow-Up Instructions: Return in about 1 week (around 10/01/2018).   Ortho Exam  Patient is alert, oriented, no adenopathy, well-dressed, normal affect, normal respiratory effort. Patient is two-week status post fibular excision for osteomyelitis.  He has slight wound dehiscence which is gaped open 2 mm.  There is no surrounding cellulitis no odor, clear drainage.  Imaging: No results found. No images are attached to the encounter.  Labs: Lab Results  Component Value Date   HGBA1C 7.6 (H) 08/21/2018   HGBA1C 8.0 (H) 04/19/2018   HGBA1C 7.6 (H) 03/07/2017   ESRSEDRATE 46 (H) 04/18/2018   ESRSEDRATE 2 12/04/2017   CRP 1.4 (H) 04/21/2018   CRP 6.8 (H) 04/18/2018   CRP 3.1 12/04/2017   REPTSTATUS 09/18/2018 FINAL 09/09/2018   GRAMSTAIN  09/09/2018    ABUNDANT WBC PRESENT,BOTH PMN AND MONONUCLEAR FEW GRAM POSITIVE COCCI Performed at [facility], [address], [place], [place] [address]    CULT  09/09/2018    MODERATE STAPHYLOCOCCUS AUREUS FEW  PEPTOSTREPTOCOCCUS SPECIES    LABORGA STAPHYLOCOCCUS AUREUS 09/09/2018     Lab Results  Component Value Date   ALBUMIN 3.0 (L) 04/19/2018   ALBUMIN 3.3 (L) 04/18/2018   ALBUMIN 3.0 (L) 12/02/2014    Lab Results  Component Value Date   MG 1.8 12/03/2014   No results found for: VD25OH  No results found for: PREALBUMIN CBC EXTENDED Latest Ref Rng & Units 09/09/2018 08/21/2018 04/23/2018  WBC 4.0 - 10.5 K/uL 9.8 8.0 9.7  RBC 4.22 - 5.81 MIL/uL 4.76 4.95 5.11  HGB 13.0 - 17.0 g/dL 24.2 35.3 61.4  HCT 43.1 - 52.0 % 40.9 42.5 42.3  PLT 150 - 400 K/uL 349 303 284  NEUTROABS 1.7 - 7.7 K/uL - - -  LYMPHSABS 0.7 - 4.0 K/uL - - -     Body mass index is 23.75 kg/m.  Orders:  No orders of the defined types were placed in this encounter.  No orders of the defined types were placed in this encounter.    Procedures: No procedures performed  Clinical Data: No additional findings.  ROS:  All other systems negative, except as noted in the HPI. Review of Systems  Objective: Vital Signs: Ht 6' 0.99" (1.854 m)   Wt 180 lb (81.6 kg)   BMI 23.75 kg/m   Specialty Comments:  No specialty comments available.  PMFS History: Patient Active  Problem List   Diagnosis Date Noted  . Below-knee amputation of right lower extremity (HCC) 09/09/2018  . Dehiscence of amputation stump (HCC) 08/18/2018  . H/O amputation of lesser toe, left (HCC) 05/06/2018  . Peripheral vascular disease (HCC) 04/19/2018  . Diabetic foot infection (HCC) 04/18/2018  . Tobacco abuse counseling   . Critical lower limb ischemia 05/02/2017  . Subacute osteomyelitis of right fibula (HCC)   . 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 Diabetes mellitus, insulin dependent (IDDM), controlled (HCC)   . Type 2 diabetes mellitus with diabetic neuropathy, with long-term current use of insulin (HCC)   . S/P unilateral BKA (below knee amputation) (HCC)   . Rheumatoid arthritis  (HCC)   . Gallstones 09/23/2013  . Non-intractable cyclical vomiting with nausea 09/23/2013  . Epidermal inclusion cyst 11/30/2012   Past Medical History:  Diagnosis Date  . Arthritis    "pretty much all over"  . Cellulitis of left foot   . COPD (chronic obstructive pulmonary disease) (HCC)   . Dyspnea   . Hyperlipidemia   . Hypertension   . Neuromuscular disorder (HCC)    neuropathy legs  . Neuropathy    legs  . Osteomyelitis (HCC) 04/2018   4th toe left foot  . Osteomyelitis of fourth toe of left foot (HCC) 04/19/2018  . Peripheral vascular disease (HCC)   . Type II diabetes mellitus (HCC)   . Wears glasses   . Wears partial dentures    top and bottom partials    Family History  Problem Relation Age of Onset  . Cirrhosis Father     Past Surgical History:  Procedure Laterality Date  . AMPUTATION Left 04/22/2018   Procedure: LEFT FOOT 4TH RAY AMPUTATION;  Surgeon: [name], MD;  Location: [facility] OR;  Service: Orthopedics;  Laterality: Left;  . BACK SURGERY    . CHOLECYSTECTOMY N/A 10/26/2013   Procedure: LAPAROSCOPIC CHOLECYSTECTOMY WITH INTRAOPERATIVE CHOLANGIOGRAM;  Surgeon: Clovis Pu. Cornett, MD;  Location: [facility] OR;  Service: General;  Laterality: N/A;  . COLONOSCOPY    . CYST EXCISION Bilateral 02/15/2015   Procedure: LEFT INDEX FINGER AND RIGHT MIDDLE FINGER NODULE EXCISION;  Surgeon: Tarry Kos, MD;  Location: [facility];  Service: Orthopedics;  Laterality: Bilateral;  . CYST EXCISION PERINEAL N/A 12/08/2012   Procedure: CYST EXCISION PERINeum;  Surgeon: Maisie Fus A. Cornett, MD;  Location: [facility];  Service: General;  Laterality: N/A;  . FOOT AMPUTATION Right 2005  . I&D EXTREMITY Right 03/07/2017   Procedure: EXCISION FIBULAR HEAD RIGHT BELOW KNEE AMPUTATION;  Surgeon: [name], MD;  Location: [facility] OR;  Service: Orthopedics;  Laterality: Right;  . INCISE AND DRAIN ABCESS  12/02/2014   PERINEAL ABSCESS  . INCISION AND DRAINAGE  PERIRECTAL ABSCESS Left 12/02/2014   Procedure: IRRIGATION AND DEBRIDEMENT PERINEAL ABSCESS;  Surgeon: Abigail Miyamoto, MD;  Location: [facility] OR;  Service: General;  Laterality: Left;  . LEG AMPUTATION BELOW KNEE Right 2005  . LOWER EXTREMITY ANGIOGRAPHY N/A 05/05/2017   Procedure: LOWER EXTREMITY ANGIOGRAPHY;  Surgeon: [name], MD;  Location: [facility] INVASIVE CV LAB;  Service: Cardiovascular;  Laterality: N/A;  . LUMBAR DISC SURGERY  82,90   ruptured disc  . PERIPHERAL VASCULAR INTERVENTION Right 05/05/2017   Procedure: PERIPHERAL VASCULAR INTERVENTION;  Surgeon: [name], MD;  Location: [facility] INVASIVE CV LAB;  Service: Cardiovascular;  Laterality: Right;  . SCAR REVISION Right 2005   @ amputation  .  STUMP REVISION Right 08/21/2018   Procedure: REVISION RIGHT BELOW KNEE AMPUTATION, EXCISION FIBULA;  Surgeon: Newt Minion, MD;  Location: [facility];  Service: Orthopedics;  Laterality: Right;  . STUMP REVISION Right 09/09/2018   Procedure: REVISION RIGHT BELOW KNEE AMPUTATION;  Surgeon: Newt Minion, MD;  Location: [facility];  Service: Orthopedics;  Laterality: Right;  . TOE AMPUTATION Right 2005   Social History   Occupational History  . Not on file  Tobacco Use  . Smoking status: Former Smoker    Packs/day: 1.00    Years: 47.00    Pack years: 47.00    Types: Cigarettes    Quit date: 12/2017    Years since quitting: 0.8  . Smokeless tobacco: Never Used  Substance and Sexual Activity  . Alcohol use: No  . Drug use: No  . Sexual activity: Never

## 2018-10-01 ENCOUNTER — Other Ambulatory Visit: Payer: Self-pay

## 2018-10-01 ENCOUNTER — Encounter: Payer: Self-pay | Admitting: Radiology

## 2018-10-01 ENCOUNTER — Encounter (HOSPITAL_BASED_OUTPATIENT_CLINIC_OR_DEPARTMENT_OTHER): Payer: Medicare (Managed Care) | Attending: Internal Medicine

## 2018-10-01 ENCOUNTER — Encounter (HOSPITAL_BASED_OUTPATIENT_CLINIC_OR_DEPARTMENT_OTHER): Payer: Self-pay

## 2018-10-01 ENCOUNTER — Telehealth: Payer: Self-pay | Admitting: Family

## 2018-10-01 DIAGNOSIS — T8781 Dehiscence of amputation stump: Secondary | ICD-10-CM | POA: Insufficient documentation

## 2018-10-01 DIAGNOSIS — Z794 Long term (current) use of insulin: Secondary | ICD-10-CM | POA: Insufficient documentation

## 2018-10-01 DIAGNOSIS — Z89511 Acquired absence of right leg below knee: Secondary | ICD-10-CM | POA: Insufficient documentation

## 2018-10-01 DIAGNOSIS — F17210 Nicotine dependence, cigarettes, uncomplicated: Secondary | ICD-10-CM | POA: Insufficient documentation

## 2018-10-01 DIAGNOSIS — E1140 Type 2 diabetes mellitus with diabetic neuropathy, unspecified: Secondary | ICD-10-CM | POA: Insufficient documentation

## 2018-10-01 DIAGNOSIS — W19XXXA Unspecified fall, initial encounter: Secondary | ICD-10-CM | POA: Insufficient documentation

## 2018-10-01 DIAGNOSIS — I10 Essential (primary) hypertension: Secondary | ICD-10-CM | POA: Insufficient documentation

## 2018-10-01 DIAGNOSIS — T8131XA Disruption of external operation (surgical) wound, not elsewhere classified, initial encounter: Secondary | ICD-10-CM | POA: Diagnosis present

## 2018-10-01 DIAGNOSIS — E1151 Type 2 diabetes mellitus with diabetic peripheral angiopathy without gangrene: Secondary | ICD-10-CM | POA: Insufficient documentation

## 2018-10-01 DIAGNOSIS — J449 Chronic obstructive pulmonary disease, unspecified: Secondary | ICD-10-CM | POA: Insufficient documentation

## 2018-10-01 DIAGNOSIS — S51812A Laceration without foreign body of left forearm, initial encounter: Secondary | ICD-10-CM | POA: Insufficient documentation

## 2018-10-01 DIAGNOSIS — Y835 Amputation of limb(s) as the cause of abnormal reaction of the patient, or of later complication, without mention of misadventure at the time of the procedure: Secondary | ICD-10-CM | POA: Insufficient documentation

## 2018-10-01 NOTE — Telephone Encounter (Signed)
I called pt and advised that he should come in for appt so Dr. Sharol Given can eval incision and see if stitches are ready to be d/c'd.. pt states that he saw Dr. Dellia Nims and that he felt that the stitches needed to be removed since they had been in since 09/09/18 I advised pt that he was supposed to have one week follo wup which would have been today to have eval but if he wanted to keep appt with wound center that was ok and they could remove there.pt voiced understanding and to call with questions.

## 2018-10-01 NOTE — Telephone Encounter (Signed)
Patient called asked when should the sutures be removed? Patient said he had surgery on 09/09/2018. The number to contact patient is [phone]

## 2018-10-06 ENCOUNTER — Other Ambulatory Visit: Payer: Self-pay

## 2018-10-06 DIAGNOSIS — I739 Peripheral vascular disease, unspecified: Secondary | ICD-10-CM

## 2018-10-09 ENCOUNTER — Other Ambulatory Visit: Payer: Self-pay

## 2018-10-09 ENCOUNTER — Encounter: Payer: Self-pay | Admitting: Cardiovascular Disease

## 2018-10-09 ENCOUNTER — Encounter (HOSPITAL_BASED_OUTPATIENT_CLINIC_OR_DEPARTMENT_OTHER): Payer: Medicare (Managed Care) | Attending: Internal Medicine

## 2018-10-09 ENCOUNTER — Ambulatory Visit (HOSPITAL_COMMUNITY)
Admission: RE | Admit: 2018-10-09 | Discharge: 2018-10-09 | Disposition: A | Discharge status: Home / Self Care | Payer: Medicare (Managed Care) | Source: Ambulatory Visit | Attending: Family | Admitting: Family

## 2018-10-09 ENCOUNTER — Ambulatory Visit (INDEPENDENT_AMBULATORY_CARE_PROVIDER_SITE_OTHER)
Admission: RE | Admit: 2018-10-09 | Discharge: 2018-10-09 | Disposition: A | Discharge status: Home / Self Care | Payer: Medicare (Managed Care) | Source: Ambulatory Visit | Attending: Family | Admitting: Family

## 2018-10-09 ENCOUNTER — Ambulatory Visit (INDEPENDENT_AMBULATORY_CARE_PROVIDER_SITE_OTHER): Payer: Medicare (Managed Care) | Admitting: Cardiovascular Disease

## 2018-10-09 ENCOUNTER — Other Ambulatory Visit (HOSPITAL_COMMUNITY): Payer: Self-pay | Admitting: Internal Medicine

## 2018-10-09 ENCOUNTER — Encounter (HOSPITAL_COMMUNITY): Payer: Self-pay

## 2018-10-09 VITALS — BP 130/58 | HR 83 | Temp 97.2°F | Ht 73.0 in | Wt 180.0 lb

## 2018-10-09 DIAGNOSIS — I10 Essential (primary) hypertension: Secondary | ICD-10-CM | POA: Diagnosis not present

## 2018-10-09 DIAGNOSIS — I998 Other disorder of circulatory system: Secondary | ICD-10-CM | POA: Diagnosis not present

## 2018-10-09 DIAGNOSIS — Z0189 Encounter for other specified special examinations: Secondary | ICD-10-CM

## 2018-10-09 DIAGNOSIS — T8189XA Other complications of procedures, not elsewhere classified, initial encounter: Secondary | ICD-10-CM | POA: Insufficient documentation

## 2018-10-09 DIAGNOSIS — J449 Chronic obstructive pulmonary disease, unspecified: Secondary | ICD-10-CM | POA: Insufficient documentation

## 2018-10-09 DIAGNOSIS — B9561 Methicillin susceptible Staphylococcus aureus infection as the cause of diseases classified elsewhere: Secondary | ICD-10-CM | POA: Insufficient documentation

## 2018-10-09 DIAGNOSIS — E1151 Type 2 diabetes mellitus with diabetic peripheral angiopathy without gangrene: Secondary | ICD-10-CM | POA: Diagnosis not present

## 2018-10-09 DIAGNOSIS — S81801A Unspecified open wound, right lower leg, initial encounter: Secondary | ICD-10-CM

## 2018-10-09 DIAGNOSIS — M86661 Other chronic osteomyelitis, right tibia and fibula: Secondary | ICD-10-CM | POA: Insufficient documentation

## 2018-10-09 DIAGNOSIS — Z716 Tobacco abuse counseling: Secondary | ICD-10-CM | POA: Diagnosis not present

## 2018-10-09 DIAGNOSIS — E1140 Type 2 diabetes mellitus with diabetic neuropathy, unspecified: Secondary | ICD-10-CM | POA: Insufficient documentation

## 2018-10-09 DIAGNOSIS — S51812A Laceration without foreign body of left forearm, initial encounter: Secondary | ICD-10-CM | POA: Insufficient documentation

## 2018-10-09 DIAGNOSIS — E1169 Type 2 diabetes mellitus with other specified complication: Secondary | ICD-10-CM | POA: Insufficient documentation

## 2018-10-09 DIAGNOSIS — W19XXXA Unspecified fall, initial encounter: Secondary | ICD-10-CM | POA: Diagnosis not present

## 2018-10-09 DIAGNOSIS — M069 Rheumatoid arthritis, unspecified: Secondary | ICD-10-CM | POA: Insufficient documentation

## 2018-10-09 DIAGNOSIS — Y835 Amputation of limb(s) as the cause of abnormal reaction of the patient, or of later complication, without mention of misadventure at the time of the procedure: Secondary | ICD-10-CM | POA: Diagnosis not present

## 2018-10-09 DIAGNOSIS — I70229 Atherosclerosis of native arteries of extremities with rest pain, unspecified extremity: Secondary | ICD-10-CM

## 2018-10-09 NOTE — Progress Notes (Signed)
10/09/2018 [name]   [birth year]  [id number]  [place], [place] Primary Cardiologist: Lorretta Harp MD FACP, [facility], [place], [place]  HPI:  Brian Zavala is a 68 y.o.  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 He was referred by Dr. Sharol Given for peripheral vascular evaluation because of critical limb ischemia.I last saw him in the office  01/21/2018. He has a history of 75-pack-years of tobacco abuse currently smoking 1-1/2 packs a day as well as 2 hypertension, diabetes and hyperlipidemia. He has never had a heart attack or stroke. 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 In June of last year he developed leg stump ulcer which has not healed. Dopplers performed in our office yesterday showed an occluded right SFA.. I performed angiography on him 05/05/17 revealing an occluded right SFA from the origin down the adductor canal which I was able to revascularize using directional atherectomy with drug-eluting balloon angioplasty. Did have an occluded popliteal as well. Follow-up lower extremity arterial Doppler studies performed 05/14/17 showed marked improvement. He did also stop smoking at that time.   He had begun hyperbaric therapy and his right stump ulcer has completely healed after 6 months.  He did start smoking again.  Unfortunately developed a recurrent ulcer several months ago and had repeat surgery by Dr. Sharol Given 09/09/2018 with revision of his stump.  Unfortunately, the wound has been slow to heal.  Follow-up Dopplers performed today revealed occluded right SFA.   Current Meds  Medication Sig  . aspirin 81 MG chewable tablet Chew 81 mg by mouth daily.  Marland Kitchen atorvastatin (LIPITOR) 80 MG tablet Take 1 tablet (80 mg total) by mouth daily at 6 PM.  . calcium carbonate (OSCAL) 1500 (600 Ca) MG TABS tablet Take 600 mg of elemental calcium by mouth daily.  Marland Kitchen ceFAZolin (ANCEF) IVPB  Inject 2 g into the vein every 8 (eight) hours. Indication:  Osteomyelitis Last Day of Therapy:  10/21/2018 Labs - Once weekly:  CBC/D and BMP, Labs - Every other week:  ESR and CRP  . cephALEXin (KEFLEX) 500 MG capsule Take 500 mg by mouth 4 (four) times daily.  . cholecalciferol (VITAMIN D) 1000 UNITS tablet Take 1,000 Units by mouth daily.  . clopidogrel (PLAVIX) 75 MG tablet Take 1 tablet (75 mg total) by mouth daily with breakfast.  . docusate sodium (COLACE) 100 MG capsule Take 100 mg by mouth 2 (two) times daily.  Marland Kitchen etanercept (ENBREL) 50 MG/ML injection Inject 50 mg into the skin every Sunday. Not taking due to surgery  . furosemide (LASIX) 20 MG tablet Take 20 mg by mouth daily.   . hydrochlorothiazide (MICROZIDE) 12.5 MG capsule Take 12.5 mg by mouth daily.  Marland Kitchen ibuprofen (ADVIL) 200 MG tablet Take 400 mg by mouth every 6 (six) hours as needed for headache or moderate pain.  . Insulin Glargine (TOUJEO SOLOSTAR [place]) Inject 100 Units into the skin every evening. Working with PCP - have decreased  . leflunomide (ARAVA) 20 MG tablet Take 20 mg by mouth daily.  . Liraglutide (VICTOZA) 18 MG/3ML SOLN injection Inject 1.8 mg into the skin every evening.   . metFORMIN (GLUCOPHAGE) 1000 MG tablet Take 1,000 mg by mouth 2 (two) times daily.  Marland Kitchen morphine (MS CONTIN) 100 MG 12 hr tablet Take 100 mg by mouth every 12 (twelve) hours.  Marland Kitchen oxyCODONE-acetaminophen (PERCOCET) 5-325 MG tablet  Take 1 tablet by mouth every 6 (six) hours as needed for moderate pain or severe pain.  Marland Kitchen oxyCODONE-acetaminophen (PERCOCET/ROXICET) 5-325 MG tablet Take 1 tablet by mouth every 6 (six) hours as needed for severe pain.  . predniSONE (DELTASONE) 1 MG tablet Take 2 mg by mouth daily with breakfast.  . pregabalin (LYRICA) 75 MG capsule Take 75-150 mg by mouth 2 (two) times daily. Take 75 mg in the morning and 150 mg in the evening  . vitamin B-12 (CYANOCOBALAMIN) 100 MCG tablet Take 100 mcg by mouth daily.  . [DISCONTINUED]  apixaban (ELIQUIS) 5 MG TABS tablet Take 5 mg by mouth 2 (two) times daily.     Allergies  Allergen Reactions  . Varenicline Other (See Comments)    Upset stomach    Social History   Socioeconomic History  . Marital status: Divorced    Spouse name: Not on file  . Number of children: Not on file  . Years of education: Not on file  . Highest education level: Not on file  Occupational History  . Not on file  Social Needs  . Financial resource strain: Not on file  . Food insecurity    Worry: Not on file    Inability: Not on file  . Transportation needs    Medical: Not on file    Non-medical: Not on file  Tobacco Use  . Smoking status: Former Smoker    Packs/day: 1.00    Years: 47.00    Pack years: 47.00    Types: Cigarettes    Quit date: 12/2017    Years since quitting: 0.8  . Smokeless tobacco: Never Used  Substance and Sexual Activity  . Alcohol use: No  . Drug use: No  . Sexual activity: Never  Lifestyle  . Physical activity    Days per week: Not on file    Minutes per session: Not on file  . Stress: Not on file  Relationships  . Social Herbalist on phone: Not on file    Gets together: Not on file    Attends religious service: Not on file    Active member of club or organization: Not on file    Attends meetings of clubs or organizations: Not on file    Relationship status: Not on file  . Intimate partner violence    Fear of current or ex partner: Not on file    Emotionally abused: Not on file    Physically abused: Not on file    Forced sexual activity: Not on file  Other Topics Concern  . Not on file  Social History Narrative  . Not on file     Review of Systems: General: negative for chills, fever, night sweats or weight changes.  Cardiovascular: negative for chest pain, dyspnea on exertion, edema, orthopnea, palpitations, paroxysmal nocturnal dyspnea or shortness of breath Dermatological: negative for rash Respiratory: negative for cough  or wheezing Urologic: negative for hematuria Abdominal: negative for nausea, vomiting, diarrhea, bright red blood per rectum, melena, or hematemesis Neurologic: negative for visual changes, syncope, or dizziness All other systems reviewed and are otherwise negative except as noted above.    Blood pressure (!) 130/58, pulse 83, temperature (!) 97.2 F (36.2 C), height '6\' 1"'$  (1.854 m), weight 180 lb (81.6 kg).  General appearance: alert and no distress Neck: no adenopathy, no JVD, supple, symmetrical, trachea midline, thyroid not enlarged, symmetric, no tenderness/mass/nodules and Bilateral carotid bruits Lungs: clear to auscultation bilaterally Heart: regular  rate and rhythm, S1, S2 normal, no murmur, click, rub or gallop Extremities: Right BKA, stump is wrapped Pulses: Right BKA therefore no pedal pulse Skin: Right stump is wrapped but apparently has a nonhealing wound secondary to recent stump revision Neurologic: Alert and oriented X 3, normal strength and tone. Normal symmetric reflexes. Normal coordination and gait  EKG not performed today  ASSESSMENT AND PLAN:   Tobacco abuse counseling History of recently discontinued tobacco abuse  Critical lower limb ischemia History of critical limb ischemia status post remote right BKA by due to Dr. Sharol Given on 2005.  Because of a stump ulcer I angiogram 10 05/05/2017 revealing occluded right SFA from the origin down to the adductor canal which I was able to revascularize using directional atherectomy and drug-coated balloon angioplasty.  He did have an occluded popliteal as well.  Follow-up Doppler studies performed performed 05/14/2017 revealed marked improvement with a widely patent SFA.  The stump ultimately healed with revascularization, wound care and hyperbaric therapy.  Recently developed a recurrent ulcer requiring surgery and has been seen back at the wound care center.  Dopplers performed today revealed an occluded right SFA.       Lorretta Harp MD FACP,FACC,FAHA, [facility] 10/09/2018 4:36 PM

## 2018-10-09 NOTE — Assessment & Plan Note (Signed)
History of recently discontinued tobacco abuse 

## 2018-10-09 NOTE — Patient Instructions (Signed)
Brian Zavala [place] [address] Dept: [phone] Loc: [phone]  Brian Zavala  10/09/2018  You are scheduled for a Peripheral Angiogram on Monday, August 17 with Dr. Quay Burow.  1. Please arrive at the [facility] (Main Entrance A) at [facility]: [address] [place], [place] [address] at 9:30 AM (This time is two hours before your procedure to ensure your preparation). Free valet parking service is available.   Special note: Every effort is made to have your procedure done on time. Please understand that emergencies sometimes delay scheduled procedures.  2. Diet: Do not eat solid foods after midnight.  The patient may have clear liquids until 5am upon the day of the procedure.  3. Labs:  You will need to have blood drawn on 10/15/2018. You do not need an appointment for blood work. This must be done before your COVID-19 test.  You will also need to have a COVID-19 test on 10/15/2018. You will need an appointment. This has been scheduled for 2:30pm.  Go to:  [facility] at [address] [address], [place], [place] [address] FOR YOUR BASIC METABOLIC PANEL, COMPLETE BLOOD COUNT, AND THYROID STIMULATING HORMONE LAB WORK. NO APPOINTMENT IS NEEDED. YOU MUST HAVE THIS LAB WORK DONE BEFORE GOING TO GET YOUR COVID-19 TEST DONE BECAUSE YOU WILL NEED TO QUARANTINE YOURSELF AFTER THE COVID-19 TEST UNTIL THE DAY OF YOUR [place].   Outpatient Procedures and Pre-procedure Testing Go to: [facility] Entrance [place], [place] [address] FOR YOUR COVID-19 TEST. YOU MUST HAVE YOUR COVID-19 TEST COMPLETED 4 DAYS PRIOR TO YOUR UPCOMING PROCEDURE/TEST. YOU WILL ALSO NEED TO QUARANTINE YOURSELF AFTER THE COVID-19 TEST UNTIL THE DAY OF YOUR PROCEDURE/TEST. RESULTS WILL  BE POSTED IN OUR SYSTEM IN 48 HOURS OR LESS.   4.  Medication instructions in preparation for your procedure:  If you take your insulin glargine at night, take only half of your usual dose the night before your procedure. If not, do not take this medication on the morning of your procedure.  Do not take your liraglutide on the morning of the procedure.   Do not take your Metformin 24 hours prior to your procedure and do not restart this medication until 48 hours after your procedure.  Do not take your furosemide on the morning of your procedure.  Do not take your hydrochlorothiazide on the morning of your procedure.    On the morning of your procedure, take your Plavix/Clopidogrel and any morning medicines NOT listed above.  You may use sips of water.  5. Plan for one night stay--bring personal belongings. 6. Bring a current list of your medications and current insurance cards. 7. You MUST have a responsible person to drive you home. 8. Someone MUST be with you the first 24 hours after you arrive home or your discharge will be delayed. 9. Please wear clothes that are easy to get on and off and wear slip-on shoes.  Thank you for allowing [place] to care for you!   -- [facility]  ___________________________________________________________  Testing/Procedures: Your physician has requested that you have an aorta/iliac duplex. During this test, an ultrasound is used to evaluate blood flow to the aorta and iliac arteries. Allow one hour for this exam. Do not eat after midnight the day before and avoid carbonated beverages. SCHEDULE FOR AFTER 10/19/2018 PROCEDURE. NEXT AVAILABLE APPOINTMENT  AFTER 10/19/2018.  Your physician has requested that you have an ankle brachial index (ABI). During this test an ultrasound and blood pressure cuff are used to evaluate the arteries that supply the arms and legs with blood. Allow thirty minutes for this exam. There are no restrictions or special instructions. SCHEDULE FOR AFTER  10/19/2018 PROCEDURE. NEXT AVAILABLE APPOINTMENT AFTER 10/19/2018.   Follow-Up: At [facility], you and your health needs are our priority.  As part of our continuing mission to provide you with exceptional heart care, we have created designated Provider Care Teams.  These Care Teams include your primary Cardiologist (physician) and Advanced Practice Providers (APPs -  Physician Assistants and Nurse Practitioners) who all work together to provide you with the care you need, when you need it. You will need a follow up appointment with Dr. Allyson Sabal approximately 3 weeks after your procedure if available.

## 2018-10-09 NOTE — H&P (View-Only) (Signed)
10/09/2018 Brian Zavala   [birth year]  [id number]  [place], [place] Primary Cardiologist: Lorretta Harp MD FACP, [facility], [place], [place]  HPI:  Brian Zavala is a 68 y.o.  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 He was referred by Dr. Sharol Given for peripheral vascular evaluation because of critical limb ischemia.I last saw him in the office  01/21/2018. He has a history of 75-pack-years of tobacco abuse currently smoking 1-1/2 packs a day as well as 2 hypertension, diabetes and hyperlipidemia. He has never had a heart attack or stroke. 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 In June of last year he developed leg stump ulcer which has not healed. Dopplers performed in our office yesterday showed an occluded right SFA.. I performed angiography on him 05/05/17 revealing an occluded right SFA from the origin down the adductor canal which I was able to revascularize using directional atherectomy with drug-eluting balloon angioplasty. Did have an occluded popliteal as well. Follow-up lower extremity arterial Doppler studies performed 05/14/17 showed marked improvement. He did also stop smoking at that time.   He had begun hyperbaric therapy and his right stump ulcer has completely healed after 6 months.  He did start smoking again.  Unfortunately developed a recurrent ulcer several months ago and had repeat surgery by Dr. Sharol Given 09/09/2018 with revision of his stump.  Unfortunately, the wound has been slow to heal.  Follow-up Dopplers performed today revealed occluded right SFA.   Current Meds  Medication Sig  . aspirin 81 MG chewable tablet Chew 81 mg by mouth daily.  Marland Kitchen atorvastatin (LIPITOR) 80 MG tablet Take 1 tablet (80 mg total) by mouth daily at 6 PM.  . calcium carbonate (OSCAL) 1500 (600 Ca) MG TABS tablet Take 600 mg of elemental calcium by mouth daily.  Marland Kitchen ceFAZolin (ANCEF) IVPB  Inject 2 g into the vein every 8 (eight) hours. Indication:  Osteomyelitis Last Day of Therapy:  10/21/2018 Labs - Once weekly:  CBC/D and BMP, Labs - Every other week:  ESR and CRP  . cephALEXin (KEFLEX) 500 MG capsule Take 500 mg by mouth 4 (four) times daily.  . cholecalciferol (VITAMIN D) 1000 UNITS tablet Take 1,000 Units by mouth daily.  . clopidogrel (PLAVIX) 75 MG tablet Take 1 tablet (75 mg total) by mouth daily with breakfast.  . docusate sodium (COLACE) 100 MG capsule Take 100 mg by mouth 2 (two) times daily.  Marland Kitchen etanercept (ENBREL) 50 MG/ML injection Inject 50 mg into the skin every Sunday. Not taking due to surgery  . furosemide (LASIX) 20 MG tablet Take 20 mg by mouth daily.   . hydrochlorothiazide (MICROZIDE) 12.5 MG capsule Take 12.5 mg by mouth daily.  Marland Kitchen ibuprofen (ADVIL) 200 MG tablet Take 400 mg by mouth every 6 (six) hours as needed for headache or moderate pain.  . Insulin Glargine (TOUJEO SOLOSTAR [place]) Inject 100 Units into the skin every evening. Working with PCP - have decreased  . leflunomide (ARAVA) 20 MG tablet Take 20 mg by mouth daily.  . Liraglutide (VICTOZA) 18 MG/3ML SOLN injection Inject 1.8 mg into the skin every evening.   . metFORMIN (GLUCOPHAGE) 1000 MG tablet Take 1,000 mg by mouth 2 (two) times daily.  Marland Kitchen morphine (MS CONTIN) 100 MG 12 hr tablet Take 100 mg by mouth every 12 (twelve) hours.  Marland Kitchen oxyCODONE-acetaminophen (PERCOCET) 5-325 MG tablet  Take 1 tablet by mouth every 6 (six) hours as needed for moderate pain or severe pain.  Marland Kitchen oxyCODONE-acetaminophen (PERCOCET/ROXICET) 5-325 MG tablet Take 1 tablet by mouth every 6 (six) hours as needed for severe pain.  . predniSONE (DELTASONE) 1 MG tablet Take 2 mg by mouth daily with breakfast.  . pregabalin (LYRICA) 75 MG capsule Take 75-150 mg by mouth 2 (two) times daily. Take 75 mg in the morning and 150 mg in the evening  . vitamin B-12 (CYANOCOBALAMIN) 100 MCG tablet Take 100 mcg by mouth daily.  . [DISCONTINUED]  apixaban (ELIQUIS) 5 MG TABS tablet Take 5 mg by mouth 2 (two) times daily.     Allergies  Allergen Reactions  . Varenicline Other (See Comments)    Upset stomach    Social History   Socioeconomic History  . Marital status: Divorced    Spouse name: Not on file  . Number of children: Not on file  . Years of education: Not on file  . Highest education level: Not on file  Occupational History  . Not on file  Social Needs  . Financial resource strain: Not on file  . Food insecurity    Worry: Not on file    Inability: Not on file  . Transportation needs    Medical: Not on file    Non-medical: Not on file  Tobacco Use  . Smoking status: Former Smoker    Packs/day: 1.00    Years: 47.00    Pack years: 47.00    Types: Cigarettes    Quit date: 12/2017    Years since quitting: 0.8  . Smokeless tobacco: Never Used  Substance and Sexual Activity  . Alcohol use: No  . Drug use: No  . Sexual activity: Never  Lifestyle  . Physical activity    Days per week: Not on file    Minutes per session: Not on file  . Stress: Not on file  Relationships  . Social Herbalist on phone: Not on file    Gets together: Not on file    Attends religious service: Not on file    Active member of club or organization: Not on file    Attends meetings of clubs or organizations: Not on file    Relationship status: Not on file  . Intimate partner violence    Fear of current or ex partner: Not on file    Emotionally abused: Not on file    Physically abused: Not on file    Forced sexual activity: Not on file  Other Topics Concern  . Not on file  Social History Narrative  . Not on file     Review of Systems: General: negative for chills, fever, night sweats or weight changes.  Cardiovascular: negative for chest pain, dyspnea on exertion, edema, orthopnea, palpitations, paroxysmal nocturnal dyspnea or shortness of breath Dermatological: negative for rash Respiratory: negative for cough  or wheezing Urologic: negative for hematuria Abdominal: negative for nausea, vomiting, diarrhea, bright red blood per rectum, melena, or hematemesis Neurologic: negative for visual changes, syncope, or dizziness All other systems reviewed and are otherwise negative except as noted above.    Blood pressure (!) 130/58, pulse 83, temperature (!) 97.2 F (36.2 C), height '6\' 1"'$  (1.854 m), weight 180 lb (81.6 kg).  General appearance: alert and no distress Neck: no adenopathy, no JVD, supple, symmetrical, trachea midline, thyroid not enlarged, symmetric, no tenderness/mass/nodules and Bilateral carotid bruits Lungs: clear to auscultation bilaterally Heart: regular  rate and rhythm, S1, S2 normal, no murmur, click, rub or gallop Extremities: Right BKA, stump is wrapped Pulses: Right BKA therefore no pedal pulse Skin: Right stump is wrapped but apparently has a nonhealing wound secondary to recent stump revision Neurologic: Alert and oriented X 3, normal strength and tone. Normal symmetric reflexes. Normal coordination and gait  EKG not performed today  ASSESSMENT AND PLAN:   Tobacco abuse counseling History of recently discontinued tobacco abuse  Critical lower limb ischemia History of critical limb ischemia status post remote right BKA by due to Dr. Sharol Given on 2005.  Because of a stump ulcer I angiogram 10 05/05/2017 revealing occluded right SFA from the origin down to the adductor canal which I was able to revascularize using directional atherectomy and drug-coated balloon angioplasty.  He did have an occluded popliteal as well.  Follow-up Doppler studies performed performed 05/14/2017 revealed marked improvement with a widely patent SFA.  The stump ultimately healed with revascularization, wound care and hyperbaric therapy.  Recently developed a recurrent ulcer requiring surgery and has been seen back at the wound care center.  Dopplers performed today revealed an occluded right SFA.       Lorretta Harp MD FACP,FACC,FAHA, [facility] 10/09/2018 4:36 PM

## 2018-10-09 NOTE — Assessment & Plan Note (Signed)
History of critical limb ischemia status post remote right BKA by due to Dr. Sharol Given on 2005.  Because of a stump ulcer I angiogram 10 05/05/2017 revealing occluded right SFA from the origin down to the adductor canal which I was able to revascularize using directional atherectomy and drug-coated balloon angioplasty.  He did have an occluded popliteal as well.  Follow-up Doppler studies performed performed 05/14/2017 revealed marked improvement with a widely patent SFA.  The stump ultimately healed with revascularization, wound care and hyperbaric therapy.  Recently developed a recurrent ulcer requiring surgery and has been seen back at the wound care center.  Dopplers performed today revealed an occluded right SFA.

## 2018-10-12 NOTE — Telephone Encounter (Signed)
This encounter was created in error - please disregard.

## 2018-10-15 ENCOUNTER — Other Ambulatory Visit: Payer: Self-pay

## 2018-10-15 ENCOUNTER — Telehealth: Payer: Self-pay | Admitting: *Deleted

## 2018-10-15 ENCOUNTER — Other Ambulatory Visit (HOSPITAL_COMMUNITY)
Admission: RE | Admit: 2018-10-15 | Discharge: 2018-10-15 | Disposition: A | Discharge status: Home / Self Care | Payer: Medicare (Managed Care) | Source: Ambulatory Visit | Attending: Cardiovascular Disease | Admitting: Cardiovascular Disease

## 2018-10-15 ENCOUNTER — Ambulatory Visit (INDEPENDENT_AMBULATORY_CARE_PROVIDER_SITE_OTHER): Payer: Medicare (Managed Care) | Admitting: Internal Medicine

## 2018-10-15 ENCOUNTER — Encounter: Payer: Self-pay | Admitting: Cardiovascular Disease

## 2018-10-15 ENCOUNTER — Encounter: Payer: Self-pay | Admitting: Internal Medicine

## 2018-10-15 ENCOUNTER — Other Ambulatory Visit (HOSPITAL_COMMUNITY): Payer: Medicare (Managed Care)

## 2018-10-15 ENCOUNTER — Telehealth: Payer: Self-pay

## 2018-10-15 DIAGNOSIS — M86261 Subacute osteomyelitis, right tibia and fibula: Secondary | ICD-10-CM | POA: Diagnosis not present

## 2018-10-15 DIAGNOSIS — Z20828 Contact with and (suspected) exposure to other viral communicable diseases: Secondary | ICD-10-CM | POA: Insufficient documentation

## 2018-10-15 DIAGNOSIS — Z01812 Encounter for preprocedural laboratory examination: Secondary | ICD-10-CM

## 2018-10-15 DIAGNOSIS — I739 Peripheral vascular disease, unspecified: Secondary | ICD-10-CM | POA: Diagnosis not present

## 2018-10-15 LAB — SARS CORONAVIRUS 2 (TAT 6-24 HRS): SARS Coronavirus 2: NEGATIVE

## 2018-10-15 MED ORDER — CEPHALEXIN 500 MG PO CAPS: 500.0000 mg | ORAL_CAPSULE | Freq: Three times a day (TID) | ORAL | 1 refills | Status: DC

## 2018-10-15 MED ORDER — CEFAZOLIN IV (FOR PTA / DISCHARGE USE ONLY): 2.0000 g | Freq: Three times a day (TID) | INTRAVENOUS | Status: AC

## 2018-10-15 NOTE — Progress Notes (Signed)
[facility] for Infectious Disease  Patient Active Problem List   Diagnosis Date Noted  . Below-knee amputation of right lower extremity ([place]) 09/09/2018    Priority: High  . Dehiscence of amputation stump ([place]) 08/18/2018    Priority: High  . Subacute osteomyelitis of right fibula (HCC)     Priority: High  . S/P unilateral BKA (below knee amputation) ([place])     Priority: High  . H/O amputation of lesser toe, left ([place]) 05/06/2018  . Peripheral vascular disease ([place]) 04/19/2018  . Diabetic foot infection ([place]) 04/18/2018  . Tobacco abuse counseling   . Critical lower limb ischemia 05/02/2017  . 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, right, sequela ([place]) 02/03/2017  . Diabetes mellitus, insulin dependent (IDDM), controlled ([place])   . Type 2 diabetes mellitus with diabetic neuropathy, with long-term current use of insulin ([place])   . Rheumatoid arthritis ([place])   . Gallstones 09/23/2013  . Non-intractable cyclical vomiting with nausea 09/23/2013  . Epidermal inclusion cyst 11/30/2012    Patient's Medications  New Prescriptions   CEPHALEXIN (KEFLEX) 500 MG CAPSULE    Take 1 capsule (500 mg total) by mouth 3 (three) times daily.  Previous Medications   ASPIRIN 81 MG CHEWABLE TABLET    Chew 81 mg by mouth daily.   ATORVASTATIN (LIPITOR) 80 MG TABLET    Take 1 tablet (80 mg total) by mouth daily at 6 PM.   CALCIUM CARBONATE (OSCAL) 1500 (600 CA) MG TABS TABLET    Take 600 mg of elemental calcium by mouth daily with breakfast.    CHOLECALCIFEROL (VITAMIN D) 1000 UNITS TABLET    Take 1,000 Units by mouth daily.   CLOPIDOGREL (PLAVIX) 75 MG TABLET    Take 1 tablet (75 mg total) by mouth daily with breakfast.   DOCUSATE SODIUM (COLACE) 100 MG CAPSULE    Take 100 mg by mouth 2 (two) times daily.   FUROSEMIDE (LASIX) 20 MG TABLET    Take 20 mg by mouth daily.    HYDROCHLOROTHIAZIDE (MICROZIDE) 12.5 MG CAPSULE    Take 12.5 mg by mouth daily.   IBUPROFEN (ADVIL) 200  MG TABLET    Take 600 mg by mouth every 6 (six) hours as needed for headache or moderate pain.    INSULIN GLARGINE (TOUJEO SOLOSTAR [place])    Inject 800 Units into the skin daily with lunch.    LEFLUNOMIDE (ARAVA) 20 MG TABLET    Take 20 mg by mouth daily.   LIRAGLUTIDE (VICTOZA) 18 MG/3ML SOLN INJECTION    Inject 1.8 mg into the skin every evening.    METFORMIN (GLUCOPHAGE) 1000 MG TABLET    Take 1,000 mg by mouth 2 (two) times daily.   MORPHINE (MS CONTIN) 100 MG 12 HR TABLET    Take 100 mg by mouth every 12 (twelve) hours.   OXYCODONE-ACETAMINOPHEN (PERCOCET) 5-325 MG TABLET    Take 1 tablet by mouth every 6 (six) hours as needed for moderate pain or severe pain.   OXYCODONE-ACETAMINOPHEN (PERCOCET/ROXICET) 5-325 MG TABLET    Take 1 tablet by mouth every 6 (six) hours as needed for severe pain.   PREDNISONE (DELTASONE) 1 MG TABLET    Take 2 mg by mouth daily with breakfast.   PREGABALIN (LYRICA) 75 MG CAPSULE    Take 75-150 mg by mouth See admin instructions. Take 75 mg by mouth in the morning and 150 mg in the afternoon   VITAMIN B-12 (CYANOCOBALAMIN) 100  MCG TABLET    Take 100 mcg by mouth daily.  Modified Medications   Modified Medication Previous Medication   CEFAZOLIN (ANCEF) IVPB ceFAZolin (ANCEF) IVPB      Inject 2 g into the vein every 8 (eight) hours for 6 days. Indication:  Osteomyelitis Last Day of Therapy:  10/21/2018 Labs - Once weekly:  CBC/D and BMP, Labs - Every other week:  ESR and CRP    Inject 2 g into the vein every 8 (eight) hours. Indication:  Osteomyelitis Last Day of Therapy:  10/21/2018 Labs - Once weekly:  CBC/D and BMP, Labs - Every other week:  ESR and CRP  Discontinued Medications   No medications on file    Subjective: Amiri is in for his hospital follow-up visit.  He has severe peripheral artery disease, diabetes and peripheral neuropathy.  He underwent right BKA in 2005. He developed fibular osteomyelitis and underwent revision in January of last year.  I saw  him last fall when Dr. Dossie Der had noted that a small piece of bone had extruded from his stump incision.  CT scan at that time did not reveal any evidence of osteomyelitis.    In June the incision became inflamed and started draining.  He was hospitalized and underwent incision and drainage and excision of his fibular remnant which was grossly infected.  No bone was sent for pathology or culture.  He thinks he was on oral doxycycline postoperatively.  He had persistent drainage leading to his latest revision on 09/09/2018.  The operative note indicates that the debridement went down to his tibia.  Operative cultures grew MSSA.  He was discharged on IV cefazolin and has now completed 5 weeks of treatment.  He has had no problems tolerating cefazolin or his PICC.    Unfortunately, he has had very poor wound healing.  He was able to quit smoking cigarettes cold [place] recently.  Recent Doppler ultrasound revealed a re-occluded right SFA.  He is going to be admitted to the hospital next week for angiography.  Is now being followed at the wound center and he tells me that the plan is for him to have a wound VAC placed.  There is a consideration of restarting hyperbaric oxygen therapy.  Review of Systems: Review of Systems  Constitutional: Negative for fever.  Gastrointestinal: Negative for abdominal pain, diarrhea, nausea and vomiting.    Past Medical History:  Diagnosis Date  . Arthritis    "pretty much all over"  . Cellulitis of left foot   . COPD (chronic obstructive pulmonary disease) ([place])   . Dyspnea   . Hyperlipidemia   . Hypertension   . Neuromuscular disorder (HCC)    neuropathy legs  . Neuropathy    legs  . Osteomyelitis ([place]) 04/2018   4th toe left foot  . Osteomyelitis of fourth toe of left foot ([place]) 04/19/2018  . Peripheral vascular disease ([place])   . Type II diabetes mellitus ([place])   . Wears glasses   . Wears partial dentures    top and bottom partials    Social History    Tobacco Use  . Smoking status: Former Smoker    Packs/day: 1.00    Years: 47.00    Pack years: 47.00    Types: Cigarettes    Quit date: 12/2017    Years since quitting: 0.8  . Smokeless tobacco: Never Used  Substance Use Topics  . Alcohol use: No  . Drug use: No    Family History  Problem Relation Age of Onset  . Cirrhosis Father     Allergies  Allergen Reactions  . Varenicline Nausea Only    Objective: Vitals:   10/15/18 1130  BP: (!) 149/88  Pulse: 93  Temp: 98.4 F (36.9 C)   There is no height or weight on file to calculate BMI.  Physical Exam Constitutional:      Comments: He is pleasant and in no distress.  He is seated in a wheelchair.  Musculoskeletal:     Comments: He has a large open wound on the lateral aspect of his right BKA stump.  There is a large amount of serous yellow drainage on his dressing.  There appears to be exposed bone at the base of the wound.         Lab Results Sedimentation rate 10/05/2018: Elevated at 31 C-reactive protein 10/05/2018: Normal at 4  Sed Rate  Date Value  04/18/2018 46 mm/hr (H)  12/04/2017 2 mm/h   CRP  Date Value  04/21/2018 1.4 mg/dL (H)  04/18/2018 6.8 mg/dL (H)  12/04/2017 3.1 mg/L    Problem List Items Addressed This Visit      High   Subacute osteomyelitis of right fibula [facility])    He is having much difficulty with wound healing.  This is likely multifactorial and due to his diabetes, smoldering infection and critical limb ischemia.  I discussed management options with him.  I do not favor stopping antibiotics anytime soon but would be comfortable transitioning him to oral cephalexin when he completed 6 weeks of IV cefazolin next week.  He is in agreement with that plan.  He will follow-up here in 6 weeks.      Relevant Medications   ceFAZolin (ANCEF) IVPB   cephALEXin (KEFLEX) 500 MG capsule (Start on 10/22/2018)       Michel Bickers, MD [facility] [phone] pager   [phone] cell 10/15/2018, 12:07 PM

## 2018-10-15 NOTE — Assessment & Plan Note (Signed)
He is having much difficulty with wound healing.  This is likely multifactorial and due to his diabetes, smoldering infection and critical limb ischemia.  I discussed management options with him.  I do not favor stopping antibiotics anytime soon but would be comfortable transitioning him to oral cephalexin when he completed 6 weeks of IV cefazolin next week.  He is in agreement with that plan.  He will follow-up here in 6 weeks.

## 2018-10-15 NOTE — Telephone Encounter (Signed)
Called [facility], Spoke to Correta to relay orders per Dr. Megan Salon.  New orders: Stop Cefazolin Iv antibiotics on 10/21/2018 and to have PICC line pulled after the last dose.  Corretta demonstrated understanding with feedback, with no questions or concerns.  Patient made aware of new plan at office visit on 10/15/2018.      Lenore Cordia, [place]

## 2018-10-15 NOTE — Telephone Encounter (Signed)
Pt contacted pre-abdominal aortogram scheduled at [facility] for: Monday October 19, 2018 11:30 AM Verified arrival time and place: [facility]) at: 9:30 AM   No solid food after midnight prior to cath, clear liquids until 5 AM day of procedure. Contrast allergy: no  Hold: Lasix-AM of procedure. HCTZ- AM of procedure. Metformin-day of procedure and 48 hours post procedure. Victoza-AM of procedure. Insulin-PM prior to procedure-1/2 usual Insulin  Pt states he does not take AM insulin.  Except hold medications AM meds can be  taken pre-cath with sip of water including: ASA 81 mg Plavix 75 mg  Confirmed patient has responsible person to drive home post procedure and observe 24 hours after arriving home: yes  Due to Covid-19 pandemic, only one support person will be allowed with patient. Must be the same support person for that patient's entire stay, will be screened and required to wear a mask.   Patients are required to wear a mask when they enter the hospital.       COVID-19 Pre-Screening Questions:  . In the past 7 to 10 days have you had a cough,  shortness of breath, headache, congestion, fever (100 or greater) body aches, chills, sore throat, or sudden loss of taste or sense of smell? no . Have you been around anyone with known Covid 19? no . Have you been around anyone who is awaiting Covid 19 test results in the past 7 to 10 days? no . Have you been around anyone who has been exposed to Covid 19, or has mentioned symptoms of Covid 19 within the past 7 to 10 days? no  I reviewed procedure/mask/visitor instructions, Covid-19 screening questions with patient, he verbalized understanding, thanked me for call.

## 2018-10-15 NOTE — Telephone Encounter (Signed)
Thank you :)

## 2018-10-16 DIAGNOSIS — T8189XA Other complications of procedures, not elsewhere classified, initial encounter: Secondary | ICD-10-CM | POA: Diagnosis not present

## 2018-10-19 ENCOUNTER — Ambulatory Visit (HOSPITAL_COMMUNITY)
Admission: RE | Admit: 2018-10-19 | Discharge: 2018-10-20 | Disposition: A | Discharge status: Home / Self Care | Payer: Medicare (Managed Care) | Attending: Cardiovascular Disease | Admitting: Cardiovascular Disease

## 2018-10-19 ENCOUNTER — Encounter (HOSPITAL_COMMUNITY)
Admission: RE | Disposition: A | Discharge status: Home / Self Care | Payer: Self-pay | Source: Home / Self Care | Attending: Cardiovascular Disease

## 2018-10-19 ENCOUNTER — Other Ambulatory Visit: Payer: Self-pay

## 2018-10-19 ENCOUNTER — Encounter (HOSPITAL_COMMUNITY): Payer: Self-pay | Admitting: Cardiovascular Disease

## 2018-10-19 DIAGNOSIS — Z7982 Long term (current) use of aspirin: Secondary | ICD-10-CM | POA: Insufficient documentation

## 2018-10-19 DIAGNOSIS — R000 Tachycardia, unspecified: Secondary | ICD-10-CM | POA: Insufficient documentation

## 2018-10-19 DIAGNOSIS — I10 Essential (primary) hypertension: Secondary | ICD-10-CM | POA: Diagnosis not present

## 2018-10-19 DIAGNOSIS — Z7902 Long term (current) use of antithrombotics/antiplatelets: Secondary | ICD-10-CM | POA: Diagnosis not present

## 2018-10-19 DIAGNOSIS — F17210 Nicotine dependence, cigarettes, uncomplicated: Secondary | ICD-10-CM | POA: Insufficient documentation

## 2018-10-19 DIAGNOSIS — L97819 Non-pressure chronic ulcer of other part of right lower leg with unspecified severity: Secondary | ICD-10-CM | POA: Insufficient documentation

## 2018-10-19 DIAGNOSIS — I998 Other disorder of circulatory system: Secondary | ICD-10-CM | POA: Diagnosis present

## 2018-10-19 DIAGNOSIS — I70238 Atherosclerosis of native arteries of right leg with ulceration of other part of lower right leg: Secondary | ICD-10-CM | POA: Diagnosis not present

## 2018-10-19 DIAGNOSIS — S88111A Complete traumatic amputation at level between knee and ankle, right lower leg, initial encounter: Secondary | ICD-10-CM | POA: Diagnosis present

## 2018-10-19 DIAGNOSIS — Z791 Long term (current) use of non-steroidal anti-inflammatories (NSAID): Secondary | ICD-10-CM | POA: Diagnosis not present

## 2018-10-19 DIAGNOSIS — Z79899 Other long term (current) drug therapy: Secondary | ICD-10-CM | POA: Diagnosis not present

## 2018-10-19 DIAGNOSIS — E11622 Type 2 diabetes mellitus with other skin ulcer: Secondary | ICD-10-CM | POA: Diagnosis not present

## 2018-10-19 DIAGNOSIS — I70229 Atherosclerosis of native arteries of extremities with rest pain, unspecified extremity: Secondary | ICD-10-CM | POA: Diagnosis present

## 2018-10-19 DIAGNOSIS — E785 Hyperlipidemia, unspecified: Secondary | ICD-10-CM | POA: Diagnosis not present

## 2018-10-19 DIAGNOSIS — Z89511 Acquired absence of right leg below knee: Secondary | ICD-10-CM | POA: Insufficient documentation

## 2018-10-19 DIAGNOSIS — Z9889 Other specified postprocedural states: Secondary | ICD-10-CM | POA: Diagnosis present

## 2018-10-19 DIAGNOSIS — I7092 Chronic total occlusion of artery of the extremities: Secondary | ICD-10-CM | POA: Diagnosis not present

## 2018-10-19 DIAGNOSIS — I70201 Unspecified atherosclerosis of native arteries of extremities, right leg: Secondary | ICD-10-CM

## 2018-10-19 HISTORY — PX: PERIPHERAL VASCULAR INTERVENTION: CATH118257

## 2018-10-19 HISTORY — PX: ABDOMINAL AORTOGRAM W/LOWER EXTREMITY: CATH118223

## 2018-10-19 LAB — POCT ACTIVATED CLOTTING TIME
Activated Clotting Time: 301 seconds
Activated Clotting Time: 257 seconds
Activated Clotting Time: 224 seconds
Activated Clotting Time: 246 seconds
Activated Clotting Time: 164 seconds

## 2018-10-19 LAB — GLUCOSE, CAPILLARY
Glucose-Capillary: 292 mg/dL — ABNORMAL HIGH (ref 70–99)
Glucose-Capillary: 210 mg/dL — ABNORMAL HIGH (ref 70–99)
Glucose-Capillary: 132 mg/dL — ABNORMAL HIGH (ref 70–99)
Glucose-Capillary: 273 mg/dL — ABNORMAL HIGH (ref 70–99)

## 2018-10-19 SURGERY — ABDOMINAL AORTOGRAM W/LOWER EXTREMITY
Anesthesia: LOCAL | Laterality: Right

## 2018-10-19 MED ORDER — FENTANYL CITRATE (PF) 100 MCG/2ML IJ SOLN
INTRAMUSCULAR | Status: DC | PRN
  Administered 2018-10-19 (×4): 25 ug via INTRAVENOUS

## 2018-10-19 MED ORDER — SODIUM CHLORIDE 0.9% FLUSH
3.0000 mL | Freq: Two times a day (BID) | INTRAVENOUS | Status: DC
  Administered 2018-10-19: 10 mL via INTRAVENOUS
  Administered 2018-10-20: 09:00:00 3 mL via INTRAVENOUS

## 2018-10-19 MED ORDER — CLOPIDOGREL BISULFATE 75 MG PO TABS
75.0000 mg | ORAL_TABLET | Freq: Every day | ORAL | Status: DC
  Administered 2018-10-20: 75 mg via ORAL
  Filled 2018-10-19: qty 1

## 2018-10-19 MED ORDER — ASPIRIN 81 MG PO CHEW: 81.0000 mg | CHEWABLE_TABLET | Freq: Every day | ORAL | Status: DC

## 2018-10-19 MED ORDER — HYDRALAZINE HCL 20 MG/ML IJ SOLN: 5.0000 mg | INTRAMUSCULAR | Status: DC | PRN

## 2018-10-19 MED ORDER — ASPIRIN EC 81 MG PO TBEC
81.0000 mg | DELAYED_RELEASE_TABLET | Freq: Every day | ORAL | Status: DC
  Administered 2018-10-20: 09:00:00 81 mg via ORAL
  Filled 2018-10-19: qty 1

## 2018-10-19 MED ORDER — FUROSEMIDE 20 MG PO TABS
20.0000 mg | ORAL_TABLET | Freq: Every day | ORAL | Status: DC
  Administered 2018-10-19 – 2018-10-20 (×2): 20 mg via ORAL
  Filled 2018-10-19 (×2): qty 1

## 2018-10-19 MED ORDER — SODIUM CHLORIDE 0.9 % WEIGHT BASED INFUSION
3.0000 mL/kg/h | INTRAVENOUS | Status: DC
  Administered 2018-10-19: 3 mL/kg/h via INTRAVENOUS

## 2018-10-19 MED ORDER — HEPARIN SODIUM (PORCINE) 1000 UNIT/ML IJ SOLN
INTRAMUSCULAR | Status: DC | PRN
  Administered 2018-10-19: 8000 [IU] via INTRAVENOUS
  Administered 2018-10-19: 4000 [IU] via INTRAVENOUS

## 2018-10-19 MED ORDER — HEPARIN (PORCINE) IN NACL 1000-0.9 UT/500ML-% IV SOLN
INTRAVENOUS | Status: AC
  Filled 2018-10-19: qty 1000

## 2018-10-19 MED ORDER — LABETALOL HCL 5 MG/ML IV SOLN: 10.0000 mg | INTRAVENOUS | Status: DC | PRN

## 2018-10-19 MED ORDER — LIDOCAINE HCL (PF) 1 % IJ SOLN
INTRAMUSCULAR | Status: DC | PRN
  Administered 2018-10-19: 15 mL via INTRADERMAL

## 2018-10-19 MED ORDER — LEFLUNOMIDE 20 MG PO TABS
20.0000 mg | ORAL_TABLET | Freq: Every day | ORAL | Status: DC
  Administered 2018-10-20: 20 mg via ORAL
  Filled 2018-10-19 (×2): qty 1

## 2018-10-19 MED ORDER — SODIUM CHLORIDE 0.9% FLUSH: 3.0000 mL | INTRAVENOUS | Status: DC | PRN

## 2018-10-19 MED ORDER — CLOPIDOGREL BISULFATE 75 MG PO TABS: 75.0000 mg | ORAL_TABLET | ORAL | Status: DC

## 2018-10-19 MED ORDER — SODIUM CHLORIDE 0.9 % IV SOLN: 250.0000 mL | INTRAVENOUS | Status: DC | PRN

## 2018-10-19 MED ORDER — OXYCODONE-ACETAMINOPHEN 5-325 MG PO TABS
1.0000 | ORAL_TABLET | Freq: Four times a day (QID) | ORAL | Status: DC | PRN
  Administered 2018-10-19 – 2018-10-20 (×2): 1 via ORAL
  Filled 2018-10-19 (×2): qty 1

## 2018-10-19 MED ORDER — ATORVASTATIN CALCIUM 80 MG PO TABS: 80.0000 mg | ORAL_TABLET | Freq: Every day | ORAL | Status: DC

## 2018-10-19 MED ORDER — IODIXANOL 320 MG/ML IV SOLN
INTRAVENOUS | Status: DC | PRN
  Administered 2018-10-19: 175 mL via INTRA_ARTERIAL

## 2018-10-19 MED ORDER — ACETAMINOPHEN 325 MG PO TABS: 650.0000 mg | ORAL_TABLET | ORAL | Status: DC | PRN

## 2018-10-19 MED ORDER — HEPARIN SODIUM (PORCINE) 1000 UNIT/ML IJ SOLN
INTRAMUSCULAR | Status: AC
  Filled 2018-10-19: qty 1

## 2018-10-19 MED ORDER — ATORVASTATIN CALCIUM 80 MG PO TABS
80.0000 mg | ORAL_TABLET | Freq: Every day | ORAL | Status: DC
  Administered 2018-10-19: 80 mg via ORAL
  Filled 2018-10-19: qty 1

## 2018-10-19 MED ORDER — LIDOCAINE HCL (PF) 1 % IJ SOLN
INTRAMUSCULAR | Status: AC
  Filled 2018-10-19: qty 30

## 2018-10-19 MED ORDER — INSULIN ASPART 100 UNIT/ML ~~LOC~~ SOLN
0.0000 [IU] | Freq: Three times a day (TID) | SUBCUTANEOUS | Status: DC
  Administered 2018-10-19: 2 [IU] via SUBCUTANEOUS
  Administered 2018-10-20: 8 [IU] via SUBCUTANEOUS
  Administered 2018-10-20: 15 [IU] via SUBCUTANEOUS
  Filled 2018-10-19: qty 0.15

## 2018-10-19 MED ORDER — SODIUM CHLORIDE 0.9 % WEIGHT BASED INFUSION: 1.0000 mL/kg/h | INTRAVENOUS | Status: DC

## 2018-10-19 MED ORDER — MORPHINE SULFATE (PF) 2 MG/ML IV SOLN
2.0000 mg | INTRAVENOUS | Status: DC | PRN
  Administered 2018-10-19 – 2018-10-20 (×2): 2 mg via INTRAVENOUS
  Filled 2018-10-19 (×2): qty 1

## 2018-10-19 MED ORDER — SODIUM CHLORIDE 0.9 % IV SOLN
INTRAVENOUS | Status: AC
  Administered 2018-10-19: 17:00:00 via INTRAVENOUS

## 2018-10-19 MED ORDER — MIDAZOLAM HCL 2 MG/2ML IJ SOLN
INTRAMUSCULAR | Status: DC | PRN
  Administered 2018-10-19: 1 mg via INTRAVENOUS

## 2018-10-19 MED ORDER — ONDANSETRON HCL 4 MG/2ML IJ SOLN: 4.0000 mg | Freq: Four times a day (QID) | INTRAMUSCULAR | Status: DC | PRN

## 2018-10-19 MED ORDER — PREDNISONE 1 MG PO TABS
2.0000 mg | ORAL_TABLET | Freq: Every day | ORAL | Status: DC
  Administered 2018-10-20: 2 mg via ORAL
  Filled 2018-10-19: qty 2

## 2018-10-19 MED ORDER — ASPIRIN 81 MG PO CHEW: 81.0000 mg | CHEWABLE_TABLET | ORAL | Status: DC

## 2018-10-19 MED ORDER — MIDAZOLAM HCL 2 MG/2ML IJ SOLN
INTRAMUSCULAR | Status: AC
  Filled 2018-10-19: qty 2

## 2018-10-19 MED ORDER — CLOPIDOGREL BISULFATE 75 MG PO TABS: 75.0000 mg | ORAL_TABLET | Freq: Every day | ORAL | Status: DC

## 2018-10-19 MED ORDER — HEPARIN (PORCINE) IN NACL 1000-0.9 UT/500ML-% IV SOLN
INTRAVENOUS | Status: DC | PRN
  Administered 2018-10-19 (×2): 500 mL

## 2018-10-19 MED ORDER — FENTANYL CITRATE (PF) 100 MCG/2ML IJ SOLN
INTRAMUSCULAR | Status: AC
  Filled 2018-10-19: qty 2

## 2018-10-19 SURGICAL SUPPLY — 35 items
BALLN ADMIRAL INPACT 5X250 (BALLOONS) ×3
BALLN COYOTE OTW 2.5X220X150 (BALLOONS) ×3
BALLN IN.PACT DCB 5X120 (BALLOONS) ×3
BALLOON ADMIRAL INPACT 5X250 (BALLOONS) IMPLANT
BALLOON COYOTE OTW 2.5X220X150 (BALLOONS) IMPLANT
CATH ANGIO 5F PIGTAIL 65CM (CATHETERS) ×1 IMPLANT
CATH CROSS OVER TEMPO 5F (CATHETERS) ×1 IMPLANT
CATH HAWKONE LS STANDARD TIP (CATHETERS) ×3
CATH HAWKONE LS STD TIP (CATHETERS) IMPLANT
CATH NAVICROSS ANGLED 135CM (MICROCATHETER) ×1 IMPLANT
CATH STRAIGHT 5FR 65CM (CATHETERS) ×1 IMPLANT
CATH VIANCE CROSS STAND 150CM (MICROCATHETER) ×3
CATH VIANCE CROSS STD 150CM (MICROCATHETER) IMPLANT
DCB IN.PACT 5X120 (BALLOONS) IMPLANT
DEVICE CONTINUOUS FLUSH (MISCELLANEOUS) ×1 IMPLANT
DEVICE TORQUE .025-.038 (MISCELLANEOUS) ×1 IMPLANT
GLIDEWIRE ANGLED SS 035X260CM (WIRE) ×1 IMPLANT
GUIDEWIRE ASTATO XS 20G 300CM (WIRE) ×1 IMPLANT
KIT ENCORE 26 ADVANTAGE (KITS) ×1 IMPLANT
KIT ESSENTIALS PG (KITS) ×1 IMPLANT
KIT PV (KITS) ×3 IMPLANT
SHEATH HIGHFLEX ANSEL 7FR 55CM (SHEATH) ×1 IMPLANT
SHEATH PINNACLE 5F 10CM (SHEATH) ×1 IMPLANT
SHEATH PINNACLE 7F 10CM (SHEATH) ×1 IMPLANT
STOPCOCK MORSE 400PSI 3WAY (MISCELLANEOUS) ×1 IMPLANT
SYR MEDRAD MARK 7 150ML (SYRINGE) ×3 IMPLANT
TAPE VIPERTRACK RADIOPAQ (MISCELLANEOUS) IMPLANT
TAPE VIPERTRACK RADIOPAQUE (MISCELLANEOUS) ×1
TRANSDUCER W/STOPCOCK (MISCELLANEOUS) ×3 IMPLANT
TRAY PV CATH (CUSTOM PROCEDURE TRAY) ×3 IMPLANT
TUBING CIL FLEX 10 FLL-RA (TUBING) ×1 IMPLANT
WIRE HI TORQ VERSACORE J 260CM (WIRE) ×1 IMPLANT
WIRE HITORQ VERSACORE ST 145CM (WIRE) ×1 IMPLANT
WIRE ROSEN-J .035X260CM (WIRE) ×1 IMPLANT
WIRE SPARTACORE .014X300CM (WIRE) ×1 IMPLANT

## 2018-10-19 NOTE — Progress Notes (Signed)
Site area: left groin fa sheath pulled and pressure held by Ree Kida Site Prior to Removal:  Level 1 Pressure Applied For: 30 minutes  Manual:   yes Patient Status During Pull:  stable Post Pull Site:  Level  0 Post Pull Instructions Given:  yes Post Pull Pulses Present: left pt dopplered Dressing Applied:  Gauze and tegaderm Bedrest begins @ 7026 Comments:

## 2018-10-19 NOTE — Interval H&P Note (Signed)
History and Physical Interval Note:  10/19/2018 10:38 AM  Brian Zavala  has presented today for surgery, with the diagnosis of PAD.  The various methods of treatment have been discussed with the patient and family. After consideration of risks, benefits and other options for treatment, the patient has consented to  Procedure(s): ABDOMINAL AORTOGRAM W/LOWER EXTREMITY (Bilateral) as a surgical intervention.  The patient's history has been reviewed, patient examined, no change in status, stable for surgery.  I have reviewed the patient's chart and labs.  Questions were answered to the patient's satisfaction.     Quay Burow

## 2018-10-20 ENCOUNTER — Other Ambulatory Visit: Payer: Self-pay | Admitting: Cardiology

## 2018-10-20 ENCOUNTER — Encounter (HOSPITAL_COMMUNITY): Payer: Self-pay | Admitting: Cardiovascular Disease

## 2018-10-20 ENCOUNTER — Other Ambulatory Visit: Payer: Self-pay

## 2018-10-20 DIAGNOSIS — I998 Other disorder of circulatory system: Secondary | ICD-10-CM | POA: Diagnosis not present

## 2018-10-20 DIAGNOSIS — Z959 Presence of cardiac and vascular implant and graft, unspecified: Secondary | ICD-10-CM

## 2018-10-20 DIAGNOSIS — I739 Peripheral vascular disease, unspecified: Secondary | ICD-10-CM

## 2018-10-20 DIAGNOSIS — I70238 Atherosclerosis of native arteries of right leg with ulceration of other part of lower right leg: Secondary | ICD-10-CM | POA: Diagnosis not present

## 2018-10-20 LAB — BASIC METABOLIC PANEL
Anion gap: 11 (ref 5–15)
BUN: 11 mg/dL (ref 8–23)
CO2: 24 mmol/L (ref 22–32)
Calcium: 8.2 mg/dL — ABNORMAL LOW (ref 8.9–10.3)
Chloride: 100 mmol/L (ref 98–111)
Creatinine, Ser: 0.65 mg/dL (ref 0.61–1.24)
GFR calc Af Amer: >60 mL/min (ref >60)
GFR calc non Af Amer: >60 mL/min (ref >60)
Glucose, Bld: 222 mg/dL — ABNORMAL HIGH (ref 70–99)
Potassium: 3.3 mmol/L — ABNORMAL LOW (ref 3.5–5.1)
Sodium: 135 mmol/L (ref 135–145)

## 2018-10-20 LAB — CBC
HCT: 41.7 % (ref 39.0–52.0)
Hemoglobin: 13.5 g/dL (ref 13.0–17.0)
MCH: 27.7 pg (ref 26.0–34.0)
MCHC: 32.4 g/dL (ref 30.0–36.0)
MCV: 85.6 fL (ref 80.0–100.0)
Platelets: 300 10*3/uL (ref 150–400)
RBC: 4.87 MIL/uL (ref 4.22–5.81)
RDW: 14.5 % (ref 11.5–15.5)
WBC: 8.4 10*3/uL (ref 4.0–10.5)
nRBC: 0 % (ref 0.0–0.2)

## 2018-10-20 LAB — GLUCOSE, CAPILLARY
Glucose-Capillary: 406 mg/dL — ABNORMAL HIGH (ref 70–99)
Glucose-Capillary: 296 mg/dL — ABNORMAL HIGH (ref 70–99)

## 2018-10-20 MED ORDER — HEPARIN SOD (PORK) LOCK FLUSH 100 UNIT/ML IV SOLN
250.0000 [IU] | INTRAVENOUS | Status: AC | PRN
  Administered 2018-10-20: 12:00:00 250 [IU]

## 2018-10-20 MED ORDER — ANGIOPLASTY BOOK
Freq: Once | Status: AC
  Administered 2018-10-20: 05:00:00
  Filled 2018-10-20: qty 1

## 2018-10-20 MED ORDER — TOUJEO SOLOSTAR 300 UNIT/ML ~~LOC~~ SOPN: 40.0000 [IU] | PEN_INJECTOR | Freq: Every day | SUBCUTANEOUS | 0 refills | Status: DC

## 2018-10-20 MED ORDER — METOPROLOL TARTRATE 25 MG PO TABS: 12.5000 mg | ORAL_TABLET | Freq: Two times a day (BID) | ORAL | 0 refills | Status: DC

## 2018-10-20 NOTE — Care Management (Signed)
1204 10-20-18 Patient was previously active with [facility]- for RN Services. Pt has home wound VAC on and was originally on IV Antibiotics. Plan was to return home and complete last day of antibiotics for 10-21-18 then transition to oral. [facility] aware of transition home. No further needs from CM at this time. Bethena Roys, RN,BSN Case Manager [phone]

## 2018-10-20 NOTE — Discharge Summary (Addendum)
   Discharge Summary    Patient ID: Brian Zavala,  MRN: [id number], DOB/AGE: 09/18/1950 67 y.o.  Admit date: 10/19/2018 Discharge date: 10/20/2018  Primary Care Provider: [facility] Primary Cardiologist: No primary care provider on file.  Discharge Diagnoses    Active Problems:   Below-knee amputation of right lower extremity (HCC)   Critical limb ischemia with history of revascularization of same extremity   Allergies Allergies  Allergen Reactions  . Varenicline Nausea Only    Diagnostic Studies/Procedures    PV angiogram: 10/19/18  Procedures Performed:               1.  Abdominal aortogram/bilateral iliac angiogram               2.  Contralateral access (second order catheter placement), right lower extremity angiography with runoff               3.  Hawk 1 directional atherectomy right SFA CTO               4.  Drug-coated balloon angioplasty right SFA CTO  Final Impression: Successful right SFA CTO Hawk 1 directional atherectomy followed by drug-coated balloon angioplasty in the setting of critical ischemia.  While I did get a favorable angiographic result there was slow flow/runoff suggesting collateral insufficiency.  It is unclear whether this will provide benefit with regards to healing.  Patient was already on aspirin and Plavix.  The Ansell sheath was withdrawn across the bifurcation and exchanged over an 035 wire for a short 7 French sheath which was then secured in place.  Patient left lab in stable condition.  The sheath will be removed once ACT falls below 170 pressure held.  Patient be hydrated overnight and discharged home in the morning.  We will get lower extremity arterial Doppler studies in our [place] office next week and I will see him back 1 to 2 weeks thereafter.  Jonathan Berry. MD, FACC 10/19/2018 _____________   History of Present Illness     67 y.o.  who was referred by Dr. Duda for peripheral vascular  evaluation because of critical limb ischemia. He has a history of 75-pack-years of tobacco abuse currently smoking 1-1/2 packs a day as well as hypertension, diabetes and hyperlipidemia. He has never had a heart attack or stroke. He had a right BKA by Dr. Duda in 2005. In June of last year he developed leg stump ulcer which has not healed. Dopplers showed an occluded right SFA.. [name] performed angiography on him 05/05/17 revealing an occluded right SFA from the origin down the adductor canal which he was able to revascularize using directional atherectomy with drug-eluting balloon angioplasty. Did have an occluded popliteal as well. Follow-up lower extremity arterial Doppler studies performed 05/14/17 showed marked improvement. He did also stop smoking at that time.  He had begun hyperbaric therapy and his right stump ulcer has completely healed after 6 months.  He did start smoking again.  Unfortunately developed a recurrent ulcer several months ago and had repeat surgery by Dr. Duda 09/09/2018 with revision of his stump.  Unfortunately, the wound had been slow to heal.  Follow-up Dopplers performed recently in the office revealed occluded right SFA. He was set up for outpatient PV angiogram.   Hospital Course     Underwent successful right SFA CTO directional atherectomy followed by drug-coated balloon angioplasty in the setting of critical ischemia. Did get a good result but slow   flow was noted concerning for collateral insufficiency. Unclear whether he will benefit with sufficient healing. Remain on DAPT with ASA/plavix. Was noted to be mildly tachycardic during admission, added low dose metoprolol. Morning labs were stable. No complications noted post cath.   General: Well developed, well nourished, male appearing in no acute distress. Head: Normocephalic, atraumatic.  Neck: Supple without bruits, JVD. Lungs:  Resp regular and unlabored, CTA. Heart: Tachy, S1, S2, no murmur; no rub. Abdomen: Soft,  non-tender, non-distended with normoactive bowel sounds.  Extremities: No clubbing, cyanosis, edema. Right BKA, wound vac in place. Left femoral cath site stable without bruising or hematoma Neuro: Alert and oriented X 3. Moves all extremities spontaneously. Psych: Normal affect.  Brian Zavala was seen by Dr. McAlhany and determined stable for discharge home. Follow up in the office has been arranged. Medications are listed below.   _____________  Discharge Vitals Blood pressure 132/83, pulse (!) 109, temperature 97.8 F (36.6 C), temperature source Oral, resp. rate 16, height 6' 1" (1.854 m), weight 78.7 kg, SpO2 98 %.  Filed Weights   10/19/18 0947 10/20/18 0532  Weight: 81.6 kg 78.7 kg    Labs & Radiologic Studies    CBC Recent Labs    10/20/18 0355  WBC 8.4  HGB 13.5  HCT 41.7  MCV 85.6  PLT 300   Basic Metabolic Panel Recent Labs    10/20/18 0355  NA 135  K 3.3*  CL 100  CO2 24  GLUCOSE 222*  BUN 11  CREATININE 0.65  CALCIUM 8.2*   Liver Function Tests No results for input(s): AST, ALT, ALKPHOS, BILITOT, PROT, ALBUMIN in the last 72 hours. No results for input(s): LIPASE, AMYLASE in the last 72 hours. Cardiac Enzymes No results for input(s): CKTOTAL, CKMB, CKMBINDEX, TROPONINI in the last 72 hours. BNP Invalid input(s): POCBNP D-Dimer No results for input(s): DDIMER in the last 72 hours. Hemoglobin A1C No results for input(s): HGBA1C in the last 72 hours. Fasting Lipid Panel No results for input(s): CHOL, HDL, LDLCALC, TRIG, CHOLHDL, LDLDIRECT in the last 72 hours. Thyroid Function Tests No results for input(s): TSH, T4TOTAL, T3FREE, THYROIDAB in the last 72 hours.  Invalid input(s): FREET3 _____________  Vas Us Lower Extremity Arterial Duplex  Result Date: 10/12/2018 LOWER EXTREMITY ARTERIAL DUPLEX STUDY Indications: Ulceration right lateral distal thigh. High Risk Factors: Hypertension, hyperlipidemia, Diabetes, past history of                     smoking. Other Factors: 2005 below knee amputation.  Current ABI: not performed due to right below-knee amputation Performing Technologist: Pearl Stack RVT  Examination Guidelines: A complete evaluation includes B-mode imaging, spectral Doppler, color Doppler, and power Doppler as needed of all accessible portions of each vessel. Bilateral testing is considered an integral part of a complete examination. Limited examinations for reoccurring indications may be performed as noted.  +----------+--------+-----+--------+----------+--------+ RIGHT     PSV cm/sRatioStenosisWaveform  Comments +----------+--------+-----+--------+----------+--------+ CFA Distal135                  biphasic           +----------+--------+-----+--------+----------+--------+ DFA       212                  triphasic          +----------+--------+-----+--------+----------+--------+ SFA Prox               occluded                   +----------+--------+-----+--------+----------+--------+   SFA Mid                occluded                   +----------+--------+-----+--------+----------+--------+ SFA Distal24                   monophasic         +----------+--------+-----+--------+----------+--------+ Ankle brachial indices not warranted due to right below knee amputation. Collateral flow is noted throughout the right thigh.  Summary: Right: Patent common femoral and profunda femoral arteries. Superficial femoral artery is occluded in the proximal and mid segments and appears to reconstitute distally through a network of collaterals.  See table(s) above for measurements and observations. Electronically signed by Harold Barban MD on 10/12/2018 at 10:06:35 AM.    Final    Disposition   Pt is being discharged home today in good condition.  Follow-up Plans & Appointments    Follow-up Information    [facility] Follow up on 11/02/2018.   Specialty: Cardiology Why: at [place] for your follow up  dopplers.  Contact information: [address] [place] [phone]       Lorretta Harp, MD Follow up on 11/20/2018.   Specialties: Cardiology, Radiology Why: at 9:30am for your follow up appt.  Contact information: [address] [place] [address] [phone]          Discharge Instructions    Diet - low sodium heart healthy   Complete by: As directed    Discharge instructions   Complete by: As directed    Groin Site Care Refer to this sheet in the next few weeks. These instructions provide you with information on caring for yourself after your procedure. Your caregiver may also give you more specific instructions. Your treatment has been planned according to current medical practices, but problems sometimes occur. Call your caregiver if you have any problems or questions after your procedure. HOME CARE INSTRUCTIONS You may shower 24 hours after the procedure. Remove the bandage (dressing) and gently wash the site with plain soap and water. Gently pat the site dry.  Do not apply powder or lotion to the site.  Do not sit in a bathtub, swimming pool, or whirlpool for 5 to 7 days.  No bending, squatting, or lifting anything over 10 pounds (4.5 kg) as directed by your caregiver.  Inspect the site at least twice daily.  Do not drive home if you are discharged the same day of the procedure. Have someone else drive you.  You may drive 24 hours after the procedure unless otherwise instructed by your caregiver.  What to expect: Any bruising will usually fade within 1 to 2 weeks.  Blood that collects in the tissue (hematoma) may be painful to the touch. It should usually decrease in size and tenderness within 1 to 2 weeks.  SEEK IMMEDIATE MEDICAL CARE IF: You have unusual pain at the groin site or down the affected leg.  You have redness, warmth, swelling, or pain at the groin site.  You have drainage (other than a small  amount of blood on the dressing).  You have chills.  You have a fever or persistent symptoms for more than 72 hours.  You have a fever and your symptoms suddenly get worse.  Your leg becomes pale, cool, tingly, or numb.  You have heavy bleeding from the site. Hold pressure on the site. Marland Kitchen  PLEASE DO NOT MISS  ANY DOSES OF YOUR PLAVIX!!!!! Also keep a log of you blood pressures and bring back to your follow up appt. Please call the office with any questions.   Patients taking blood thinners should generally stay away from medicines like ibuprofen, Advil, Motrin, naproxen, and Aleve due to risk of stomach bleeding. You may take Tylenol as directed or talk to your primary doctor about alternatives.   Increase activity slowly   Complete by: As directed        Discharge Medications     Medication List    STOP taking these medications   ibuprofen 200 MG tablet Commonly known as: ADVIL     TAKE these medications   aspirin 81 MG chewable tablet Chew 81 mg by mouth daily.   atorvastatin 80 MG tablet Commonly known as: LIPITOR Take 1 tablet (80 mg total) by mouth daily at 6 PM.   calcium carbonate 1500 (600 Ca) MG Tabs tablet Commonly known as: OSCAL Take 600 mg of elemental calcium by mouth daily with breakfast.   ceFAZolin  IVPB Commonly known as: ANCEF Inject 2 g into the vein every 8 (eight) hours for 6 days. Indication:  Osteomyelitis Last Day of Therapy:  10/21/2018 Labs - Once weekly:  CBC/D and BMP, Labs - Every other week:  ESR and CRP   cephALEXin 500 MG capsule Commonly known as: KEFLEX Take 1 capsule (500 mg total) by mouth 3 (three) times daily. Start taking on: October 22, 2018   cholecalciferol 1000 units tablet Commonly known as: VITAMIN D Take 1,000 Units by mouth daily.   clopidogrel 75 MG tablet Commonly known as: PLAVIX Take 1 tablet (75 mg total) by mouth daily with breakfast.   docusate sodium 100 MG capsule Commonly known as: COLACE Take 100 mg by  mouth 2 (two) times daily.   furosemide 20 MG tablet Commonly known as: LASIX Take 20 mg by mouth daily.   hydrochlorothiazide 12.5 MG capsule Commonly known as: MICROZIDE Take 12.5 mg by mouth daily.   leflunomide 20 MG tablet Commonly known as: ARAVA Take 20 mg by mouth daily.   metFORMIN 1000 MG tablet Commonly known as: GLUCOPHAGE Take 1,000 mg by mouth 2 (two) times daily.   metoprolol tartrate 25 MG tablet Commonly known as: LOPRESSOR Take 0.5 tablets (12.5 mg total) by mouth 2 (two) times daily.   morphine 100 MG 12 hr tablet Commonly known as: MS CONTIN Take 100 mg by mouth every 12 (twelve) hours.   oxyCODONE-acetaminophen 5-325 MG tablet Commonly known as: PERCOCET/ROXICET Take 1 tablet by mouth every 6 (six) hours as needed for severe pain.   oxyCODONE-acetaminophen 5-325 MG tablet Commonly known as: Percocet Take 1 tablet by mouth every 6 (six) hours as needed for moderate pain or severe pain.   predniSONE 1 MG tablet Commonly known as: DELTASONE Take 2 mg by mouth daily with breakfast.   pregabalin 75 MG capsule Commonly known as: LYRICA Take 75-150 mg by mouth See admin instructions. Take 75 mg by mouth in the morning and 150 mg in the afternoon   Toujeo SoloStar 300 UNIT/ML Sopn Generic drug: Insulin Glargine (1 Unit Dial) Inject 40 Units into the skin daily with lunch. What changed:   medication strength  how much to take   Victoza 18 MG/3ML Soln injection Generic drug: Liraglutide Inject 1.8 mg into the skin every evening.   vitamin B-12 100 MCG tablet Commonly known as: CYANOCOBALAMIN Take 100 mcg by mouth daily.          Acute coronary syndrome (MI, NSTEMI, STEMI, etc) this admission?: No.     Outstanding Labs/Studies   Follow up dopplers.   Duration of Discharge Encounter   Greater than 30 minutes including physician time.  Signed, Lindsay Roberts NP-C 10/20/2018, 8:56 AM   I have personally seen and examined this patient.  I agree with the assessment and plan as outlined above.  He is doing well this am post PV intervention. Will d/c home today. Follow up with [name] for non-invasive testing.  Continue ASA and Plavix. Add beta blocker for tachycardia and HTN  Christopher McAlhany 10/20/2018 9:02 AM   

## 2018-10-22 ENCOUNTER — Ambulatory Visit: Payer: Medicare (Managed Care) | Admitting: Orthopedic Surgery

## 2018-10-23 DIAGNOSIS — T8189XA Other complications of procedures, not elsewhere classified, initial encounter: Secondary | ICD-10-CM | POA: Diagnosis not present

## 2018-10-30 ENCOUNTER — Ambulatory Visit
Admission: RE | Admit: 2018-10-30 | Discharge: 2018-10-30 | Disposition: A | Discharge status: Home / Self Care | Payer: Medicare (Managed Care) | Source: Ambulatory Visit | Attending: Nurse Practitioner | Admitting: Nurse Practitioner

## 2018-10-30 ENCOUNTER — Other Ambulatory Visit: Payer: Self-pay | Admitting: Nurse Practitioner

## 2018-10-30 DIAGNOSIS — T8189XA Other complications of procedures, not elsewhere classified, initial encounter: Secondary | ICD-10-CM | POA: Diagnosis not present

## 2018-10-30 DIAGNOSIS — Z9289 Personal history of other medical treatment: Secondary | ICD-10-CM

## 2018-11-02 ENCOUNTER — Encounter (HOSPITAL_COMMUNITY): Payer: Medicare (Managed Care)

## 2018-11-02 ENCOUNTER — Ambulatory Visit (HOSPITAL_COMMUNITY): Payer: Medicare (Managed Care)

## 2018-11-02 DIAGNOSIS — T8189XA Other complications of procedures, not elsewhere classified, initial encounter: Secondary | ICD-10-CM | POA: Diagnosis not present

## 2018-11-03 ENCOUNTER — Encounter (HOSPITAL_BASED_OUTPATIENT_CLINIC_OR_DEPARTMENT_OTHER): Payer: Medicare (Managed Care) | Attending: Internal Medicine

## 2018-11-03 DIAGNOSIS — J449 Chronic obstructive pulmonary disease, unspecified: Secondary | ICD-10-CM | POA: Diagnosis not present

## 2018-11-03 DIAGNOSIS — E1140 Type 2 diabetes mellitus with diabetic neuropathy, unspecified: Secondary | ICD-10-CM | POA: Diagnosis not present

## 2018-11-03 DIAGNOSIS — M069 Rheumatoid arthritis, unspecified: Secondary | ICD-10-CM | POA: Insufficient documentation

## 2018-11-03 DIAGNOSIS — M86661 Other chronic osteomyelitis, right tibia and fibula: Secondary | ICD-10-CM | POA: Insufficient documentation

## 2018-11-03 DIAGNOSIS — E1151 Type 2 diabetes mellitus with diabetic peripheral angiopathy without gangrene: Secondary | ICD-10-CM | POA: Insufficient documentation

## 2018-11-03 DIAGNOSIS — Y835 Amputation of limb(s) as the cause of abnormal reaction of the patient, or of later complication, without mention of misadventure at the time of the procedure: Secondary | ICD-10-CM | POA: Diagnosis not present

## 2018-11-03 DIAGNOSIS — E1169 Type 2 diabetes mellitus with other specified complication: Secondary | ICD-10-CM | POA: Diagnosis present

## 2018-11-03 DIAGNOSIS — T8781 Dehiscence of amputation stump: Secondary | ICD-10-CM | POA: Insufficient documentation

## 2018-11-03 DIAGNOSIS — I10 Essential (primary) hypertension: Secondary | ICD-10-CM | POA: Insufficient documentation

## 2018-11-04 ENCOUNTER — Ambulatory Visit (HOSPITAL_COMMUNITY)
Admission: RE | Admit: 2018-11-04 | Discharge: 2018-11-04 | Disposition: A | Discharge status: Home / Self Care | Payer: Medicare (Managed Care) | Source: Ambulatory Visit | Attending: Cardiology | Admitting: Cardiology

## 2018-11-04 ENCOUNTER — Other Ambulatory Visit: Payer: Self-pay

## 2018-11-04 DIAGNOSIS — E1169 Type 2 diabetes mellitus with other specified complication: Secondary | ICD-10-CM | POA: Diagnosis not present

## 2018-11-04 DIAGNOSIS — I739 Peripheral vascular disease, unspecified: Secondary | ICD-10-CM | POA: Insufficient documentation

## 2018-11-04 LAB — GLUCOSE, CAPILLARY
Glucose-Capillary: 189 mg/dL — ABNORMAL HIGH (ref 70–99)
Glucose-Capillary: 209 mg/dL — ABNORMAL HIGH (ref 70–99)

## 2018-11-05 DIAGNOSIS — E1169 Type 2 diabetes mellitus with other specified complication: Secondary | ICD-10-CM | POA: Diagnosis not present

## 2018-11-05 LAB — GLUCOSE, CAPILLARY
Glucose-Capillary: 332 mg/dL — ABNORMAL HIGH (ref 70–99)
Glucose-Capillary: 350 mg/dL — ABNORMAL HIGH (ref 70–99)

## 2018-11-06 DIAGNOSIS — E1169 Type 2 diabetes mellitus with other specified complication: Secondary | ICD-10-CM | POA: Diagnosis not present

## 2018-11-06 LAB — GLUCOSE, CAPILLARY
Glucose-Capillary: 266 mg/dL — ABNORMAL HIGH (ref 70–99)
Glucose-Capillary: 361 mg/dL — ABNORMAL HIGH (ref 70–99)

## 2018-11-10 ENCOUNTER — Telehealth: Payer: Self-pay

## 2018-11-10 NOTE — Telephone Encounter (Signed)
Spoke with pt about upcoming appt for 9/18. Informed pt that 9/18 is virtual visit day. Pt would like to reschedule for an OV day for Dr. Gwenlyn Found; he also states that there is a schedule conflict for 1/46. Advised pt that nurse would cancel 9/18 appt and route message to scheduling to reschedule. Pt verbalized understanding

## 2018-11-11 DIAGNOSIS — E1169 Type 2 diabetes mellitus with other specified complication: Secondary | ICD-10-CM | POA: Diagnosis not present

## 2018-11-11 LAB — GLUCOSE, CAPILLARY
Glucose-Capillary: 213 mg/dL — ABNORMAL HIGH (ref 70–99)
Glucose-Capillary: 219 mg/dL — ABNORMAL HIGH (ref 70–99)

## 2018-11-12 DIAGNOSIS — E1169 Type 2 diabetes mellitus with other specified complication: Secondary | ICD-10-CM | POA: Diagnosis not present

## 2018-11-12 LAB — GLUCOSE, CAPILLARY
Glucose-Capillary: 257 mg/dL — ABNORMAL HIGH (ref 70–99)
Glucose-Capillary: 221 mg/dL — ABNORMAL HIGH (ref 70–99)

## 2018-11-13 DIAGNOSIS — E1169 Type 2 diabetes mellitus with other specified complication: Secondary | ICD-10-CM | POA: Diagnosis not present

## 2018-11-13 LAB — GLUCOSE, CAPILLARY
Glucose-Capillary: 299 mg/dL — ABNORMAL HIGH (ref 70–99)
Glucose-Capillary: 236 mg/dL — ABNORMAL HIGH (ref 70–99)

## 2018-11-16 ENCOUNTER — Telehealth: Payer: Self-pay

## 2018-11-16 NOTE — Telephone Encounter (Signed)
Please ask [name] to have Brian Zavala continue taking cephalexin along with the doxycycline and follow-up with me in 2 weeks at his scheduled visit.

## 2018-11-16 NOTE — Telephone Encounter (Signed)
Brian Zavala with [facility] wanted to let Dr. Sharol Given know that patient's antibiotic was changed from Keflex to Doxycycline twice a day, due to right knee being hot and having redness.  Stated that patient is being treated for cellulitis.

## 2018-11-16 NOTE — Telephone Encounter (Signed)
[name] , NP with [facility] is calling to inform Dr Megan Salon patient was in office today with complaint of right lower knee pain, redness present with pain  for 3 days.  They suspect it is cellulitis . His Keflex was stopped and patient was given doxycycline 100 mgs twice daily for 30 days.   Laverle Patter, RN   Cletis Athens, NP with [facility] [phone]   Office [phone]

## 2018-11-16 NOTE — Telephone Encounter (Signed)
[name], NP was informed per Dr Megan Salon continue taking cephalexin alond with the doxycycline and follow-up in 2 weeks with ID.    She will inform the patient.   Laverle Patter, RN

## 2018-11-17 DIAGNOSIS — E1169 Type 2 diabetes mellitus with other specified complication: Secondary | ICD-10-CM | POA: Diagnosis not present

## 2018-11-17 LAB — GLUCOSE, CAPILLARY
Glucose-Capillary: 160 mg/dL — ABNORMAL HIGH (ref 70–99)
Glucose-Capillary: 172 mg/dL — ABNORMAL HIGH (ref 70–99)

## 2018-11-17 NOTE — Telephone Encounter (Signed)
Noted  

## 2018-11-18 DIAGNOSIS — E1169 Type 2 diabetes mellitus with other specified complication: Secondary | ICD-10-CM | POA: Diagnosis not present

## 2018-11-18 LAB — GLUCOSE, CAPILLARY
Glucose-Capillary: 179 mg/dL — ABNORMAL HIGH (ref 70–99)
Glucose-Capillary: 187 mg/dL — ABNORMAL HIGH (ref 70–99)

## 2018-11-19 DIAGNOSIS — E1169 Type 2 diabetes mellitus with other specified complication: Secondary | ICD-10-CM | POA: Diagnosis not present

## 2018-11-19 LAB — GLUCOSE, CAPILLARY
Glucose-Capillary: 153 mg/dL — ABNORMAL HIGH (ref 70–99)
Glucose-Capillary: 118 mg/dL — ABNORMAL HIGH (ref 70–99)

## 2018-11-20 ENCOUNTER — Ambulatory Visit: Payer: Medicare (Managed Care) | Admitting: Cardiovascular Disease

## 2018-11-20 DIAGNOSIS — E1169 Type 2 diabetes mellitus with other specified complication: Secondary | ICD-10-CM | POA: Diagnosis not present

## 2018-11-20 LAB — GLUCOSE, CAPILLARY
Glucose-Capillary: 194 mg/dL — ABNORMAL HIGH (ref 70–99)
Glucose-Capillary: 192 mg/dL — ABNORMAL HIGH (ref 70–99)

## 2018-11-23 ENCOUNTER — Telehealth: Payer: Self-pay | Admitting: *Deleted

## 2018-11-23 NOTE — Telephone Encounter (Signed)
RN received message in triage from Cletis Athens, NP at [facility]. She is concerned because the patient is experiencing GI issues on the 2 antibiotics (cephalexin TID for osteomyelitis plus doxycycline prescribed by [facility] for new cellulitis).  She would like to know if Dr Megan Salon wants to bring the patient in for IV antibiotic treatment. RN called [name] back for more information, left voicemail asking her to call [facility] back. Please advise. Landis Gandy, RN

## 2018-11-23 NOTE — Telephone Encounter (Signed)
Please ask her how Mr. Study right knee is looking.  If the redness and pain have improved I would have him stop the doxycycline.  If not I may need to work him in to clinic sooner than his scheduled visit on 12/08/2018.

## 2018-11-24 LAB — GLUCOSE, CAPILLARY
Glucose-Capillary: 213 mg/dL — ABNORMAL HIGH (ref 70–99)
Glucose-Capillary: 218 mg/dL — ABNORMAL HIGH (ref 70–99)

## 2018-11-24 NOTE — Telephone Encounter (Signed)
Per Charlies Constable has more pain in knee, it is red and swollen with a lot of fluctuance.  He is seeing orthopedic today where they may drain fluid (he has changed from Dr Sharol Given -> [place] ortho Dr Lucia Gaskins.)  Cheney at [facility] changed Amario's doxycycline to clindamycin 9/21.  I will ask for all of these notes to be faxed to [phone].

## 2018-11-25 ENCOUNTER — Other Ambulatory Visit: Payer: Self-pay | Admitting: Family Medicine

## 2018-11-25 ENCOUNTER — Other Ambulatory Visit: Payer: Self-pay

## 2018-11-25 ENCOUNTER — Encounter: Payer: Self-pay | Admitting: Cardiovascular Disease

## 2018-11-25 ENCOUNTER — Ambulatory Visit (INDEPENDENT_AMBULATORY_CARE_PROVIDER_SITE_OTHER): Payer: Medicare (Managed Care) | Admitting: Cardiovascular Disease

## 2018-11-25 ENCOUNTER — Other Ambulatory Visit (HOSPITAL_COMMUNITY): Payer: Self-pay | Admitting: Family Medicine

## 2018-11-25 VITALS — BP 111/77 | HR 101 | Ht 73.0 in | Wt 175.0 lb

## 2018-11-25 DIAGNOSIS — I70229 Atherosclerosis of native arteries of extremities with rest pain, unspecified extremity: Secondary | ICD-10-CM

## 2018-11-25 DIAGNOSIS — E785 Hyperlipidemia, unspecified: Secondary | ICD-10-CM | POA: Insufficient documentation

## 2018-11-25 DIAGNOSIS — Z008 Encounter for other general examination: Secondary | ICD-10-CM

## 2018-11-25 DIAGNOSIS — I6522 Occlusion and stenosis of left carotid artery: Secondary | ICD-10-CM

## 2018-11-25 DIAGNOSIS — I779 Disorder of arteries and arterioles, unspecified: Secondary | ICD-10-CM | POA: Insufficient documentation

## 2018-11-25 DIAGNOSIS — E782 Mixed hyperlipidemia: Secondary | ICD-10-CM | POA: Diagnosis not present

## 2018-11-25 DIAGNOSIS — M01X61 Direct infection of right knee in infectious and parasitic diseases classified elsewhere: Secondary | ICD-10-CM

## 2018-11-25 DIAGNOSIS — Z716 Tobacco abuse counseling: Secondary | ICD-10-CM | POA: Diagnosis not present

## 2018-11-25 DIAGNOSIS — I998 Other disorder of circulatory system: Secondary | ICD-10-CM

## 2018-11-25 NOTE — Patient Instructions (Addendum)
Medication Instructions:  Your physician recommends that you continue on your current medications as directed. Please refer to the Current Medication list given to you today.  If you need a refill on your cardiac medications before your next appointment, please call your pharmacy.   Lab work: NONE If you have labs (blood work) drawn today and your tests are completely normal, you will receive your results only by: Marland Kitchen MyChart Message (if you have MyChart) OR . A paper copy in the mail If you have any lab test that is abnormal or we need to change your treatment, we will call you to review the results.  Testing/Procedures: Your physician has requested that you have a carotid duplex. This test is an ultrasound of the carotid arteries in your neck. It looks at blood flow through these arteries that supply the brain with blood. Allow one hour for this exam. There are no restrictions or special instructions. DUE IN 12 MONTHS   Follow-Up: At [facility], you and your health needs are our priority.  As part of our continuing mission to provide you with exceptional heart care, we have created designated Provider Care Teams.  These Care Teams include your primary Cardiologist (physician) and Advanced Practice Providers (APPs -  Physician Assistants and Nurse Practitioners) who all work together to provide you with the care you need, when you need it. You will need a follow up appointment in 6 months with Dr. Quay Burow.  Please call our office 2 months in advance to schedule this appointment.

## 2018-11-25 NOTE — Assessment & Plan Note (Signed)
History of hyperlipidemia on high-dose statin therapy followed by his PCP. 

## 2018-11-25 NOTE — Assessment & Plan Note (Signed)
History of moderate bilateral ICA stenosis by duplex ultrasound 08/03/2018.  This will need to be repeated on an annual basis.

## 2018-11-25 NOTE — Telephone Encounter (Signed)
I spoke with Dr. Lucia Gaskins today.  He saw Brian Zavala in the office last week.  He said that there was a small right knee effusion.  The wound VAC dressing was still in place.  He aspirated the knee.  He says that there were 23,000 white blood cells.  No crystals were seen.  There were no organisms on Gram stain and he said that culture was pending.  He was referred back to Dr. Sharol Given who had done his previous surgeries.  He has not been seen there yet.  I asked Dr. Lucia Gaskins to have someone in his office forward culture results when they are available.

## 2018-11-25 NOTE — Telephone Encounter (Signed)
Called Dr. Pollie Friar office and spoke with Brian Zavala to request record results of gram stain and culture. Per medical records notes have not been dictated at this time and no culture results found. LPN asked Brian Zavala to provide Dr. Lucia Zavala with Dr. Hale Bogus pager number to speak with Dr. Megan Salon directly.  [name]

## 2018-11-25 NOTE — Assessment & Plan Note (Signed)
History of critical limb ischemia status post right BKA by Dr. Sharol Given on 2005.  Because of a stump ulcer I performed angiography on him 05/05/2017 revealing occluded right SFA from the origin down to the adductor canal which I was able to atherectomized and perform drug-coated balloon angioplasty resulting in marked improvement.  He did have an occluded popliteal artery.  Ultimately his wound healed.  Unfortunately, he had a recurrent ulcer several months ago and have repeat surgery by Dr. Gavin Potters 09/09/2018 with revision of his stump but this has been slow to heal and now appears to be affected infected.  I performed repeat angiography on him 10/19/2018 revealed revealing an occluded SFA which I again revascularized percutaneously however there was so full slow flow.  Follow-up Dopplers revealed this to be patent.  He continues to have pain in that stump and I suspect he will need an above-the-knee amputation.

## 2018-11-25 NOTE — Assessment & Plan Note (Signed)
Discontinued tobacco use on October 03, 2018

## 2018-11-25 NOTE — Progress Notes (Signed)
11/25/2018 Brian Zavala   November 20, 1950  [id number]  [employer] [facility], [facility] Cardiologist: [name] MD FACP, [facility], [place], [place]  HPI:  Brian Zavala is a 68 y.o.  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 He was referred by Dr. Lajoyce Corners for peripheral vascular evaluation because of critical limb ischemia.I last saw him in the office  10/09/2018. He has a history of 75-pack-years of tobacco abuse currently smoking 1-1/2 packs a day as well as 2 hypertension, diabetes and hyperlipidemia. He has never had a heart attack or stroke. 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 In June of last year he developed leg stump ulcer which has not healed. Dopplers performed in our office yesterday showed an occluded right SFA.. I performed angiography on him 05/05/17 revealing an occluded right SFA from the origin down the adductor canal which I was able to revascularize using directional atherectomy with drug-eluting balloon angioplasty. Did have an occluded popliteal as well. Follow-up lower extremity arterial Doppler studies performed 05/14/17 showed marked improvement. He did also stop smoking at that time.   He had begun hyperbaric therapy and his right stump ulcer has completely healed after 6 months.  He did start smoking again.  Unfortunately developed a recurrent ulcer several months ago and had repeat surgery by Dr. Lajoyce Corners 09/09/2018 with revision of his stump.  Unfortunately, the wound has been slow to heal.  Follow-up Dopplers performed today revealed occluded right SFA.  I performed peripheral angiography on him 10/19/2018 revealing occluded right SFA which I was able to revascularized using atherectomy and drug-coated balloon angioplasty.  Unfortunate there was slow flow although follow-up Doppler studies performed 11/04/2018 revealed his SFA to be widely patent.  He continues to have  infection in his stump with severe pain and I suspect will ultimately require right above-the-knee amputation.  He did not stop smoking on August 1 however.   Current Meds  Medication Sig  . aspirin 81 MG chewable tablet Chew 81 mg by mouth daily.  Marland Kitchen atorvastatin (LIPITOR) 80 MG tablet Take 1 tablet (80 mg total) by mouth daily at 6 PM.  . calcium carbonate (OSCAL) 1500 (600 Ca) MG TABS tablet Take 600 mg of elemental calcium by mouth daily with breakfast.   . cephALEXin (KEFLEX) 500 MG capsule Take 1 capsule (500 mg total) by mouth 3 (three) times daily.  . cholecalciferol (VITAMIN D) 1000 UNITS tablet Take 1,000 Units by mouth daily.  . clopidogrel (PLAVIX) 75 MG tablet Take 1 tablet (75 mg total) by mouth daily with breakfast.  . docusate sodium (COLACE) 100 MG capsule Take 100 mg by mouth 2 (two) times daily.  . furosemide (LASIX) 20 MG tablet Take 20 mg by mouth daily.   . hydrochlorothiazide (MICROZIDE) 12.5 MG capsule Take 12.5 mg by mouth daily.  . Insulin Glargine, 1 Unit Dial, (TOUJEO SOLOSTAR) 300 UNIT/ML SOPN Inject 40 Units into the skin daily with lunch.  . leflunomide (ARAVA) 20 MG tablet Take 20 mg by mouth daily.  . Liraglutide (VICTOZA) 18 MG/3ML SOLN injection Inject 1.8 mg into the skin every evening.   . metFORMIN (GLUCOPHAGE) 1000 MG tablet Take 1,000 mg by mouth 2 (two) times daily.  . metoprolol tartrate (LOPRESSOR) 25 MG tablet Take 0.5 tablets (12.5 mg total) by mouth 2 (two) times daily.  Marland Kitchen morphine (MS CONTIN) 100 MG 12 hr tablet  Take 100 mg by mouth every 12 (twelve) hours.  Marland Kitchen oxyCODONE-acetaminophen (PERCOCET) 5-325 MG tablet Take 1 tablet by mouth every 6 (six) hours as needed for moderate pain or severe pain.  Marland Kitchen oxyCODONE-acetaminophen (PERCOCET/ROXICET) 5-325 MG tablet Take 1 tablet by mouth every 6 (six) hours as needed for severe pain.  . predniSONE (DELTASONE) 1 MG tablet Take 2 mg by mouth daily with breakfast.  . pregabalin (LYRICA) 75 MG capsule Take  75-150 mg by mouth See admin instructions. Take 75 mg by mouth in the morning and 150 mg in the afternoon  . vitamin B-12 (CYANOCOBALAMIN) 100 MCG tablet Take 100 mcg by mouth daily.     Allergies  Allergen Reactions  . Varenicline Nausea Only    Social History   Socioeconomic History  . Marital status: Divorced    Spouse name: Not on file  . Number of children: Not on file  . Years of education: Not on file  . Highest education level: Not on file  Occupational History  . Not on file  Social Needs  . Financial resource strain: Not on file  . Food insecurity    Worry: Not on file    Inability: Not on file  . Transportation needs    Medical: Not on file    Non-medical: Not on file  Tobacco Use  . Smoking status: Former Smoker    Packs/day: 1.00    Years: 47.00    Pack years: 47.00    Types: Cigarettes    Quit date: 12/2017    Years since quitting: 0.9  . Smokeless tobacco: Never Used  Substance and Sexual Activity  . Alcohol use: No  . Drug use: No  . Sexual activity: Never  Lifestyle  . Physical activity    Days per week: Not on file    Minutes per session: Not on file  . Stress: Not on file  Relationships  . Social Herbalist on phone: Not on file    Gets together: Not on file    Attends religious service: Not on file    Active member of club or organization: Not on file    Attends meetings of clubs or organizations: Not on file    Relationship status: Not on file  . Intimate partner violence    Fear of current or ex partner: Not on file    Emotionally abused: Not on file    Physically abused: Not on file    Forced sexual activity: Not on file  Other Topics Concern  . Not on file  Social History Narrative  . Not on file     Review of Systems: General: negative for chills, fever, night sweats or weight changes.  Cardiovascular: negative for chest pain, dyspnea on exertion, edema, orthopnea, palpitations, paroxysmal nocturnal dyspnea or  shortness of breath Dermatological: negative for rash Respiratory: negative for cough or wheezing Urologic: negative for hematuria Abdominal: negative for nausea, vomiting, diarrhea, bright red blood per rectum, melena, or hematemesis Neurologic: negative for visual changes, syncope, or dizziness All other systems reviewed and are otherwise negative except as noted above.    Blood pressure 111/77, pulse (!) 101, height 6\' 1"  (1.854 m), weight 175 lb (79.4 kg), SpO2 97 %.  General appearance: alert and no distress Neck: no adenopathy, no JVD, supple, symmetrical, trachea midline, thyroid not enlarged, symmetric, no tenderness/mass/nodules and Soft bilateral carotid bruits Lungs: clear to auscultation bilaterally Heart: regular rate and rhythm, S1, S2 normal, no murmur, click,  rub or gallop Extremities: Status post right BKA Pulses: Right BKA therefore no pedal pulse Skin: Skin breakdown right below the knee amputation stump with purplish discoloration Neurologic: Alert and oriented X 3, normal strength and tone. Normal symmetric reflexes. Normal coordination and gait  EKG sinus tachycardia at 101 with borderline LVH and nonspecific ST and T wave changes.  I personally reviewed this EKG.  ASSESSMENT AND PLAN:   Critical lower limb ischemia History of critical limb ischemia status post right BKA by Dr. Lajoyce Corners on 2005.  Because of a stump ulcer I performed angiography on him 05/05/2017 revealing occluded right SFA from the origin down to the adductor canal which I was able to atherectomized and perform drug-coated balloon angioplasty resulting in marked improvement.  He did have an occluded popliteal artery.  Ultimately his wound healed.  Unfortunately, he had a recurrent ulcer several months ago and have repeat surgery by Dr. Burna Sis 09/09/2018 with revision of his stump but this has been slow to heal and now appears to be affected infected.  I performed repeat angiography on him 10/19/2018 revealed  revealing an occluded SFA which I again revascularized percutaneously however there was so full slow flow.  Follow-up Dopplers revealed this to be patent.  He continues to have pain in that stump and I suspect he will need an above-the-knee amputation.  Tobacco abuse counseling Discontinued tobacco use on October 03, 2018  Hyperlipidemia History of hyperlipidemia on high-dose statin therapy followed by his PCP  Carotid artery disease (HCC) History of moderate bilateral ICA stenosis by duplex ultrasound 08/03/2018.  This will need to be repeated on an annual basis.      [name] MD FACP,FACC,FAHA, [facility] 11/25/2018 4:02 PM

## 2018-11-26 ENCOUNTER — Encounter: Payer: Self-pay | Admitting: Orthopedic Surgery

## 2018-11-26 ENCOUNTER — Other Ambulatory Visit (HOSPITAL_COMMUNITY)
Admission: RE | Admit: 2018-11-26 | Discharge: 2018-11-26 | Disposition: A | Discharge status: Home / Self Care | Payer: Medicare (Managed Care) | Source: Ambulatory Visit | Attending: Orthopedic Surgery | Admitting: Orthopedic Surgery

## 2018-11-26 ENCOUNTER — Ambulatory Visit (INDEPENDENT_AMBULATORY_CARE_PROVIDER_SITE_OTHER): Payer: Medicare (Managed Care) | Admitting: Orthopedic Surgery

## 2018-11-26 ENCOUNTER — Encounter (HOSPITAL_COMMUNITY): Payer: Self-pay | Admitting: *Deleted

## 2018-11-26 ENCOUNTER — Other Ambulatory Visit: Payer: Self-pay

## 2018-11-26 VITALS — Ht 73.0 in | Wt 175.0 lb

## 2018-11-26 DIAGNOSIS — Z20828 Contact with and (suspected) exposure to other viral communicable diseases: Secondary | ICD-10-CM | POA: Insufficient documentation

## 2018-11-26 DIAGNOSIS — Z01812 Encounter for preprocedural laboratory examination: Secondary | ICD-10-CM | POA: Insufficient documentation

## 2018-11-26 DIAGNOSIS — T8781 Dehiscence of amputation stump: Secondary | ICD-10-CM

## 2018-11-26 DIAGNOSIS — Z89511 Acquired absence of right leg below knee: Secondary | ICD-10-CM

## 2018-11-26 LAB — SARS CORONAVIRUS 2 (TAT 6-24 HRS): SARS Coronavirus 2: NEGATIVE

## 2018-11-26 NOTE — Progress Notes (Signed)
Office Visit Note   Patient: Brian RanaDavid K Fujita           Date of Birth: 05/20/1950           MRN: [id number] Visit Date: 11/26/2018              Requested by: [employer], [facility] [address] [place],  [place] [id number] PCP: Inc, [facility]  Chief Complaint  Patient presents with  . Right Leg - Routine Post Op    09/09/2018 revision Right BKA      HPI: Patient is a 68 year old gentleman who has been undergoing surgical and medical intervention for limb salvage on the right with a dehiscence of a right below the knee amputation.  Patient has seen Dr. Allyson SabalBerry and has undergone arteriogram studies and stent placements to open up his SFA.  Patient still does not have sufficient circulation to heal the wound dehiscence of the transtibial amputation.  Patient states that he understands he needs to proceed with an above-the-knee amputation.  Assessment & Plan: Visit Diagnoses:  1. History of right below knee amputation (HCC)     Plan: We will plan for an above-knee amputation tomorrow discharge once patient is stable from a pain standpoint.  Follow-Up Instructions: No follow-ups on file.   Ortho Exam  Patient is alert, oriented, no adenopathy, well-dressed, normal affect, normal respiratory effort. Examination patient has a wound VAC in place he is on Keflex and clindamycin patient complains of global ischemic pain around the residual limb.  Imaging: No results found. No images are attached to the encounter.  Labs: Lab Results  Component Value Date   HGBA1C 7.6 (H) 08/21/2018   HGBA1C 8.0 (H) 04/19/2018   HGBA1C 7.6 (H) 03/07/2017   ESRSEDRATE 46 (H) 04/18/2018   ESRSEDRATE 2 12/04/2017   CRP 1.4 (H) 04/21/2018   CRP 6.8 (H) 04/18/2018   CRP 3.1 12/04/2017   REPTSTATUS 09/18/2018 FINAL 09/09/2018   GRAMSTAIN  09/09/2018    ABUNDANT WBC PRESENT,BOTH PMN AND MONONUCLEAR FEW GRAM POSITIVE COCCI Performed at [facility], [address] [place], [place] [address]    CULT  09/09/2018    MODERATE STAPHYLOCOCCUS AUREUS FEW PEPTOSTREPTOCOCCUS SPECIES    LABORGA STAPHYLOCOCCUS AUREUS 09/09/2018     Lab Results  Component Value Date   ALBUMIN 3.0 (L) 04/19/2018   ALBUMIN 3.3 (L) 04/18/2018   ALBUMIN 3.0 (L) 12/02/2014    Lab Results  Component Value Date   MG 1.8 12/03/2014   No results found for: VD25OH  No results found for: PREALBUMIN CBC EXTENDED Latest Ref Rng & Units 10/20/2018 09/09/2018 08/21/2018  WBC 4.0 - 10.5 K/uL 8.4 9.8 8.0  RBC 4.22 - 5.81 MIL/uL 4.87 4.76 4.95  HGB 13.0 - 17.0 g/dL 95.213.5 84.113.4 32.413.8  HCT 40.139.0 - 52.0 % 41.7 40.9 42.5  PLT 150 - 400 K/uL 300 349 303  NEUTROABS 1.7 - 7.7 K/uL - - -  LYMPHSABS 0.7 - 4.0 K/uL - - -     Body mass index is 23.09 kg/m.  Orders:  No orders of the defined types were placed in this encounter.  No orders of the defined types were placed in this encounter.    Procedures: No procedures performed  Clinical Data: No additional findings.  ROS:  All other systems negative, except as noted in the HPI. Review of Systems  Objective: Vital Signs: Ht 6\' 1"  (1.854 m)   Wt  175 lb (79.4 kg)   BMI 23.09 kg/m   Specialty Comments:  No specialty comments available.  PMFS History: Patient Active Problem List   Diagnosis Date Noted  . Hyperlipidemia 11/25/2018  . Carotid artery disease (HCC) 11/25/2018  . Critical limb ischemia with history of revascularization of same extremity 10/19/2018  . Below-knee amputation of right lower extremity (HCC) 09/09/2018  . Dehiscence of amputation stump (HCC) 08/18/2018  . H/O amputation of lesser toe, left (HCC) 05/06/2018  . Peripheral vascular disease (HCC) 04/19/2018  . Diabetic foot infection (HCC) 04/18/2018  . Tobacco abuse counseling   . Critical lower limb ischemia 05/02/2017  . Subacute osteomyelitis of right fibula (HCC)   . 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,  right, sequela (HCC) 02/03/2017  . Diabetes mellitus, insulin dependent (IDDM), controlled (HCC)   . Type 2 diabetes mellitus with diabetic neuropathy, with long-term current use of insulin (HCC)   . S/P unilateral BKA (below knee amputation) (HCC)   . Rheumatoid arthritis (HCC)   . Gallstones 09/23/2013  . Non-intractable cyclical vomiting with nausea 09/23/2013  . Epidermal inclusion cyst 11/30/2012   Past Medical History:  Diagnosis Date  . Arthritis    "pretty much all over"  . Cellulitis of left foot   . COPD (chronic obstructive pulmonary disease) (HCC)   . Dyspnea   . Hyperlipidemia   . Hypertension   . Neuromuscular disorder (HCC)    neuropathy legs  . Neuropathy    legs  . Osteomyelitis (HCC) 04/2018   4th toe left foot  . Osteomyelitis of fourth toe of left foot (HCC) 04/19/2018  . Peripheral vascular disease (HCC)   . Type II diabetes mellitus (HCC)   . Wears glasses   . Wears partial dentures    top and bottom partials    Family History  Problem Relation Age of Onset  . Cirrhosis Father     Past Surgical History:  Procedure Laterality Date  . ABDOMINAL AORTOGRAM W/LOWER EXTREMITY Bilateral 10/19/2018   Procedure: ABDOMINAL AORTOGRAM W/LOWER EXTREMITY;  Surgeon: [name], MD;  Location: [facility] INVASIVE CV LAB;  Service: Cardiovascular;  Laterality: Bilateral;  . AMPUTATION Left 04/22/2018   Procedure: LEFT FOOT 4TH RAY AMPUTATION;  Surgeon: [name], MD;  Location: [facility] OR;  Service: Orthopedics;  Laterality: Left;  . BACK SURGERY    . CHOLECYSTECTOMY N/A 10/26/2013   Procedure: LAPAROSCOPIC CHOLECYSTECTOMY WITH INTRAOPERATIVE CHOLANGIOGRAM;  Surgeon: Clovis Pu. Cornett, MD;  Location: [facility] OR;  Service: General;  Laterality: N/A;  . COLONOSCOPY    . CYST EXCISION Bilateral 02/15/2015   Procedure: LEFT INDEX FINGER AND RIGHT MIDDLE FINGER NODULE EXCISION;  Surgeon: Tarry Kos, MD;  Location: [facility];  Service: Orthopedics;  Laterality:  Bilateral;  . CYST EXCISION PERINEAL N/A 12/08/2012   Procedure: CYST EXCISION PERINeum;  Surgeon: Maisie Fus A. Cornett, MD;  Location: [facility];  Service: General;  Laterality: N/A;  . FOOT AMPUTATION Right 2005  . I&D EXTREMITY Right 03/07/2017   Procedure: EXCISION FIBULAR HEAD RIGHT BELOW KNEE AMPUTATION;  Surgeon: [name], MD;  Location: [facility] OR;  Service: Orthopedics;  Laterality: Right;  . INCISE AND DRAIN ABCESS  12/02/2014   PERINEAL ABSCESS  . INCISION AND DRAINAGE PERIRECTAL ABSCESS Left 12/02/2014   Procedure: IRRIGATION AND DEBRIDEMENT PERINEAL ABSCESS;  Surgeon: Abigail Miyamoto, MD;  Location: [facility] OR;  Service: General;  Laterality: Left;  . LEG AMPUTATION BELOW KNEE Right 2005  . LOWER EXTREMITY  ANGIOGRAPHY N/A 05/05/2017   Procedure: LOWER EXTREMITY ANGIOGRAPHY;  Surgeon: Lorretta Harp, MD;  Location: [place] CV LAB;  Service: Cardiovascular;  Laterality: N/A;  . LUMBAR DISC SURGERY  82,90   ruptured disc  . PERIPHERAL VASCULAR INTERVENTION Right 05/05/2017   Procedure: PERIPHERAL VASCULAR INTERVENTION;  Surgeon: Lorretta Harp, MD;  Location: [place] CV LAB;  Service: Cardiovascular;  Laterality: Right;  . PERIPHERAL VASCULAR INTERVENTION Right 10/19/2018   Procedure: PERIPHERAL VASCULAR INTERVENTION;  Surgeon: Lorretta Harp, MD;  Location: [facility] CV LAB;  Service: Cardiovascular;  Laterality: Right;  . SCAR REVISION Right 2005   @ amputation  . STUMP REVISION Right 08/21/2018   Procedure: REVISION RIGHT BELOW KNEE AMPUTATION, EXCISION FIBULA;  Surgeon: Newt Minion, MD;  Location: [facility];  Service: Orthopedics;  Laterality: Right;  . STUMP REVISION Right 09/09/2018   Procedure: REVISION RIGHT BELOW KNEE AMPUTATION;  Surgeon: Newt Minion, MD;  Location: [place];  Service: Orthopedics;  Laterality: Right;  . TOE AMPUTATION Right 2005   Social History   Occupational History  . Not on file  Tobacco Use  . Smoking status: Former Smoker     Packs/day: 1.00    Years: 47.00    Pack years: 47.00    Types: Cigarettes    Quit date: 12/2017    Years since quitting: 0.9  . Smokeless tobacco: Never Used  Substance and Sexual Activity  . Alcohol use: No  . Drug use: No  . Sexual activity: Never

## 2018-11-26 NOTE — Progress Notes (Signed)
Spoke with pt for pre-op call. Pt denies heart history, does have PAD and sees Dr. Gwenlyn Found. Pt is a type 2 diabetic, last A1C was 7.6 in June, 2020. Pt states his fasting blood sugar is usually between 100-140. Instructed pt to take 1/2 of his regular dose of Glargine Insulin this evening, will take 40 units. Instructed pt to check his blood sugar when he gets up and every 2 hours until he leaves for the hospital. If blood sugar is 70 or below, treat with 1/2 cup of clear juice (apple or cranberry) and recheck blood sugar 15 minutes after drinking juice. If blood sugar continues to be 70 or below, call the Short Stay department and ask to speak to a nurse. Pt voiced understanding. Pt given ERAS instructions (no food after midnight, but may have clear liquids until 9:00 AM) Pt states he has Gatorade G2 at home, instructed him to drink 12 oz in the AM just before 9:00 AM. Pt voiced understanding. Pt had his Covid test done today. Pt states he is in quarantine.   Pt was informed about the visitation policy and voiced understanding.  I had left a message for Malachy Mood at Dr. Jess Barters office to call me about pt's Plavix, Aspirin and Arava. She states pt does not have to stop any of them per Dr. Sharol Given, just not take day of surgery. This is what I had instructed pt to do.

## 2018-11-27 ENCOUNTER — Inpatient Hospital Stay (HOSPITAL_COMMUNITY)
Admission: RE | Admit: 2018-11-27 | Discharge: 2018-12-01 | DRG: 475 | Disposition: A | Discharge status: Home / Self Care | Payer: Medicare (Managed Care) | Attending: Orthopedic Surgery | Admitting: Orthopedic Surgery

## 2018-11-27 ENCOUNTER — Other Ambulatory Visit: Payer: Self-pay

## 2018-11-27 ENCOUNTER — Inpatient Hospital Stay (HOSPITAL_COMMUNITY): Payer: Medicare (Managed Care) | Admitting: Anesthesiology

## 2018-11-27 ENCOUNTER — Encounter (HOSPITAL_COMMUNITY)
Admission: RE | Disposition: A | Discharge status: Home / Self Care | Payer: Self-pay | Source: Home / Self Care | Attending: Orthopedic Surgery

## 2018-11-27 ENCOUNTER — Encounter (HOSPITAL_COMMUNITY): Payer: Self-pay

## 2018-11-27 DIAGNOSIS — T8743 Infection of amputation stump, right lower extremity: Secondary | ICD-10-CM

## 2018-11-27 DIAGNOSIS — Z888 Allergy status to other drugs, medicaments and biological substances status: Secondary | ICD-10-CM | POA: Diagnosis not present

## 2018-11-27 DIAGNOSIS — E1152 Type 2 diabetes mellitus with diabetic peripheral angiopathy with gangrene: Secondary | ICD-10-CM | POA: Diagnosis present

## 2018-11-27 DIAGNOSIS — J449 Chronic obstructive pulmonary disease, unspecified: Secondary | ICD-10-CM | POA: Diagnosis not present

## 2018-11-27 DIAGNOSIS — Z20828 Contact with and (suspected) exposure to other viral communicable diseases: Secondary | ICD-10-CM | POA: Diagnosis present

## 2018-11-27 DIAGNOSIS — Z9049 Acquired absence of other specified parts of digestive tract: Secondary | ICD-10-CM | POA: Diagnosis not present

## 2018-11-27 DIAGNOSIS — Z89422 Acquired absence of other left toe(s): Secondary | ICD-10-CM | POA: Diagnosis not present

## 2018-11-27 DIAGNOSIS — Z8619 Personal history of other infectious and parasitic diseases: Secondary | ICD-10-CM

## 2018-11-27 DIAGNOSIS — Z978 Presence of other specified devices: Secondary | ICD-10-CM

## 2018-11-27 DIAGNOSIS — Z87891 Personal history of nicotine dependence: Secondary | ICD-10-CM

## 2018-11-27 DIAGNOSIS — T8781 Dehiscence of amputation stump: Secondary | ICD-10-CM | POA: Diagnosis present

## 2018-11-27 DIAGNOSIS — I10 Essential (primary) hypertension: Secondary | ICD-10-CM | POA: Diagnosis present

## 2018-11-27 DIAGNOSIS — Z89511 Acquired absence of right leg below knee: Secondary | ICD-10-CM

## 2018-11-27 DIAGNOSIS — F17211 Nicotine dependence, cigarettes, in remission: Secondary | ICD-10-CM

## 2018-11-27 DIAGNOSIS — E785 Hyperlipidemia, unspecified: Secondary | ICD-10-CM | POA: Diagnosis present

## 2018-11-27 DIAGNOSIS — E1151 Type 2 diabetes mellitus with diabetic peripheral angiopathy without gangrene: Secondary | ICD-10-CM

## 2018-11-27 DIAGNOSIS — Z89611 Acquired absence of right leg above knee: Secondary | ICD-10-CM

## 2018-11-27 HISTORY — PX: AMPUTATION: SHX166

## 2018-11-27 HISTORY — PX: ABOVE KNEE LEG AMPUTATION: SUR20

## 2018-11-27 LAB — CBC
HCT: 34.7 % — ABNORMAL LOW (ref 39.0–52.0)
Hemoglobin: 11.4 g/dL — ABNORMAL LOW (ref 13.0–17.0)
MCH: 27.4 pg (ref 26.0–34.0)
MCHC: 32.9 g/dL (ref 30.0–36.0)
MCV: 83.4 fL (ref 80.0–100.0)
Platelets: 408 10*3/uL — ABNORMAL HIGH (ref 150–400)
RBC: 4.16 MIL/uL — ABNORMAL LOW (ref 4.22–5.81)
RDW: 14.1 % (ref 11.5–15.5)
WBC: 9 10*3/uL (ref 4.0–10.5)
nRBC: 0 % (ref 0.0–0.2)

## 2018-11-27 LAB — HEMOGLOBIN A1C
Hgb A1c MFr Bld: 9 % — ABNORMAL HIGH (ref 4.8–5.6)
Mean Plasma Glucose: 211.6 mg/dL

## 2018-11-27 LAB — GLUCOSE, CAPILLARY
Glucose-Capillary: 182 mg/dL — ABNORMAL HIGH (ref 70–99)
Glucose-Capillary: 145 mg/dL — ABNORMAL HIGH (ref 70–99)
Glucose-Capillary: 134 mg/dL — ABNORMAL HIGH (ref 70–99)
Glucose-Capillary: 259 mg/dL — ABNORMAL HIGH (ref 70–99)
Glucose-Capillary: 159 mg/dL — ABNORMAL HIGH (ref 70–99)

## 2018-11-27 LAB — BASIC METABOLIC PANEL
Anion gap: 10 (ref 5–15)
BUN: 11 mg/dL (ref 8–23)
CO2: 27 mmol/L (ref 22–32)
Calcium: 8.3 mg/dL — ABNORMAL LOW (ref 8.9–10.3)
Chloride: 97 mmol/L — ABNORMAL LOW (ref 98–111)
Creatinine, Ser: 0.62 mg/dL (ref 0.61–1.24)
GFR calc Af Amer: >60 mL/min (ref >60)
GFR calc non Af Amer: >60 mL/min (ref >60)
Glucose, Bld: 193 mg/dL — ABNORMAL HIGH (ref 70–99)
Potassium: 3.6 mmol/L (ref 3.5–5.1)
Sodium: 134 mmol/L — ABNORMAL LOW (ref 135–145)

## 2018-11-27 SURGERY — AMPUTATION, ABOVE KNEE
Anesthesia: General | Site: Knee | Laterality: Right

## 2018-11-27 MED ORDER — LIDOCAINE 2% (20 MG/ML) 5 ML SYRINGE
INTRAMUSCULAR | Status: DC | PRN
  Administered 2018-11-27: 100 mg via INTRAVENOUS

## 2018-11-27 MED ORDER — DOCUSATE SODIUM 100 MG PO CAPS: 100.0000 mg | ORAL_CAPSULE | Freq: Two times a day (BID) | ORAL | Status: DC

## 2018-11-27 MED ORDER — METOCLOPRAMIDE HCL 5 MG PO TABS: 5.0000 mg | ORAL_TABLET | Freq: Three times a day (TID) | ORAL | Status: DC | PRN

## 2018-11-27 MED ORDER — FENTANYL CITRATE (PF) 100 MCG/2ML IJ SOLN
25.0000 ug | INTRAMUSCULAR | Status: DC | PRN
  Administered 2018-11-27 (×3): 50 ug via INTRAVENOUS

## 2018-11-27 MED ORDER — ONDANSETRON HCL 4 MG/2ML IJ SOLN: 4.0000 mg | Freq: Four times a day (QID) | INTRAMUSCULAR | Status: DC | PRN

## 2018-11-27 MED ORDER — CALCIUM CARBONATE 1250 (500 CA) MG PO TABS
1.0000 | ORAL_TABLET | Freq: Every day | ORAL | Status: DC
  Administered 2018-11-28 – 2018-12-01 (×4): 500 mg via ORAL
  Filled 2018-11-27 (×4): qty 1

## 2018-11-27 MED ORDER — SUCCINYLCHOLINE CHLORIDE 20 MG/ML IJ SOLN
INTRAMUSCULAR | Status: DC | PRN
  Administered 2018-11-27: 80 mg via INTRAVENOUS

## 2018-11-27 MED ORDER — PREDNISONE 1 MG PO TABS
2.0000 mg | ORAL_TABLET | Freq: Every day | ORAL | Status: DC
  Administered 2018-11-28 – 2018-12-01 (×4): 2 mg via ORAL
  Filled 2018-11-27 (×5): qty 2

## 2018-11-27 MED ORDER — PREGABALIN 75 MG PO CAPS
150.0000 mg | ORAL_CAPSULE | Freq: Every day | ORAL | Status: DC
  Administered 2018-11-27: 15:00:00 150 mg via ORAL
  Filled 2018-11-27: qty 2

## 2018-11-27 MED ORDER — CHLORHEXIDINE GLUCONATE CLOTH 2 % EX PADS
6.0000 | MEDICATED_PAD | Freq: Every day | CUTANEOUS | Status: DC
  Administered 2018-11-28 – 2018-12-01 (×4): 6 via TOPICAL

## 2018-11-27 MED ORDER — HYDROMORPHONE HCL 1 MG/ML IJ SOLN
INTRAMUSCULAR | Status: AC
  Administered 2018-11-27: 14:00:00 0.5 mg via INTRAVENOUS
  Filled 2018-11-27: qty 1

## 2018-11-27 MED ORDER — METOCLOPRAMIDE HCL 5 MG/ML IJ SOLN: 5.0000 mg | Freq: Three times a day (TID) | INTRAMUSCULAR | Status: DC | PRN

## 2018-11-27 MED ORDER — PREGABALIN 75 MG PO CAPS
150.0000 mg | ORAL_CAPSULE | Freq: Two times a day (BID) | ORAL | Status: DC
  Administered 2018-11-27 – 2018-12-01 (×8): 150 mg via ORAL
  Filled 2018-11-27 (×8): qty 2

## 2018-11-27 MED ORDER — DOCUSATE SODIUM 100 MG PO CAPS
100.0000 mg | ORAL_CAPSULE | Freq: Two times a day (BID) | ORAL | Status: DC
  Administered 2018-11-27 – 2018-11-30 (×7): 100 mg via ORAL
  Filled 2018-11-27 (×8): qty 1

## 2018-11-27 MED ORDER — HYDROCHLOROTHIAZIDE 12.5 MG PO CAPS
12.5000 mg | ORAL_CAPSULE | Freq: Every day | ORAL | Status: DC
  Administered 2018-11-27 – 2018-12-01 (×5): 12.5 mg via ORAL
  Filled 2018-11-27 (×5): qty 1

## 2018-11-27 MED ORDER — ACETAMINOPHEN 325 MG PO TABS: 325.0000 mg | ORAL_TABLET | Freq: Four times a day (QID) | ORAL | Status: DC | PRN

## 2018-11-27 MED ORDER — INSULIN GLARGINE 100 UNIT/ML ~~LOC~~ SOLN
60.0000 [IU] | Freq: Every day | SUBCUTANEOUS | Status: DC
  Administered 2018-11-28 – 2018-11-30 (×3): 60 [IU] via SUBCUTANEOUS
  Filled 2018-11-27 (×4): qty 0.6

## 2018-11-27 MED ORDER — KETOROLAC TROMETHAMINE 30 MG/ML IJ SOLN
15.0000 mg | Freq: Once | INTRAMUSCULAR | Status: AC
  Administered 2018-11-27: 13:00:00 15 mg via INTRAVENOUS

## 2018-11-27 MED ORDER — LIRAGLUTIDE 18 MG/3ML ~~LOC~~ SOLN: 1.8000 mg | Freq: Every evening | SUBCUTANEOUS | Status: DC

## 2018-11-27 MED ORDER — ACETAMINOPHEN 10 MG/ML IV SOLN
INTRAVENOUS | Status: AC
  Administered 2018-11-27: 1000 mg via INTRAVENOUS
  Filled 2018-11-27: qty 100

## 2018-11-27 MED ORDER — MIDAZOLAM HCL 5 MG/5ML IJ SOLN
INTRAMUSCULAR | Status: DC | PRN
  Administered 2018-11-27: 2 mg via INTRAVENOUS

## 2018-11-27 MED ORDER — FENTANYL CITRATE (PF) 100 MCG/2ML IJ SOLN
INTRAMUSCULAR | Status: AC
  Administered 2018-11-27: 13:00:00 50 ug via INTRAVENOUS
  Filled 2018-11-27: qty 2

## 2018-11-27 MED ORDER — FENTANYL CITRATE (PF) 100 MCG/2ML IJ SOLN
INTRAMUSCULAR | Status: DC | PRN
  Administered 2018-11-27: 50 ug via INTRAVENOUS
  Administered 2018-11-27: 100 ug via INTRAVENOUS
  Administered 2018-11-27: 50 ug via INTRAVENOUS

## 2018-11-27 MED ORDER — FENTANYL CITRATE (PF) 100 MCG/2ML IJ SOLN
INTRAMUSCULAR | Status: AC
  Filled 2018-11-27: qty 2

## 2018-11-27 MED ORDER — PHENYLEPHRINE 40 MCG/ML (10ML) SYRINGE FOR IV PUSH (FOR BLOOD PRESSURE SUPPORT)
PREFILLED_SYRINGE | INTRAVENOUS | Status: DC | PRN
  Administered 2018-11-27: 80 ug via INTRAVENOUS
  Administered 2018-11-27: 120 ug via INTRAVENOUS

## 2018-11-27 MED ORDER — CHLORHEXIDINE GLUCONATE 4 % EX LIQD: 60.0000 mL | Freq: Once | CUTANEOUS | Status: DC

## 2018-11-27 MED ORDER — HYDROMORPHONE HCL 1 MG/ML IJ SOLN
0.5000 mg | INTRAMUSCULAR | Status: AC | PRN
  Administered 2018-11-27 (×2): 0.5 mg via INTRAVENOUS

## 2018-11-27 MED ORDER — ATORVASTATIN CALCIUM 80 MG PO TABS
80.0000 mg | ORAL_TABLET | Freq: Every day | ORAL | Status: DC
  Administered 2018-11-27 – 2018-11-30 (×4): 80 mg via ORAL
  Filled 2018-11-27 (×4): qty 1

## 2018-11-27 MED ORDER — FUROSEMIDE 20 MG PO TABS
20.0000 mg | ORAL_TABLET | Freq: Every day | ORAL | Status: DC
  Administered 2018-11-27 – 2018-12-01 (×5): 20 mg via ORAL
  Filled 2018-11-27 (×5): qty 1

## 2018-11-27 MED ORDER — HYDROMORPHONE HCL 1 MG/ML IJ SOLN
0.5000 mg | INTRAMUSCULAR | Status: AC | PRN
  Administered 2018-11-27 (×2): 0.5 mg via INTRAVENOUS

## 2018-11-27 MED ORDER — KETOROLAC TROMETHAMINE 15 MG/ML IJ SOLN
INTRAMUSCULAR | Status: AC
  Administered 2018-11-27: 15:00:00 15 mg
  Filled 2018-11-27: qty 1

## 2018-11-27 MED ORDER — HYDROMORPHONE HCL 1 MG/ML IJ SOLN
0.5000 mg | INTRAMUSCULAR | Status: DC | PRN
  Administered 2018-11-27 – 2018-11-30 (×13): 1 mg via INTRAVENOUS
  Filled 2018-11-27 (×18): qty 1

## 2018-11-27 MED ORDER — OXYCODONE HCL 5 MG PO TABS
ORAL_TABLET | ORAL | Status: AC
  Filled 2018-11-27: qty 2

## 2018-11-27 MED ORDER — INSULIN ASPART 100 UNIT/ML ~~LOC~~ SOLN
0.0000 [IU] | Freq: Three times a day (TID) | SUBCUTANEOUS | Status: DC
  Administered 2018-11-27: 5 [IU] via SUBCUTANEOUS
  Administered 2018-11-28: 2 [IU] via SUBCUTANEOUS
  Administered 2018-11-28: 1 [IU] via SUBCUTANEOUS
  Administered 2018-11-29: 2 [IU] via SUBCUTANEOUS
  Administered 2018-11-29: 1 [IU] via SUBCUTANEOUS
  Administered 2018-11-30 (×3): 3 [IU] via SUBCUTANEOUS
  Administered 2018-12-01: 2 [IU] via SUBCUTANEOUS

## 2018-11-27 MED ORDER — FENTANYL CITRATE (PF) 250 MCG/5ML IJ SOLN
INTRAMUSCULAR | Status: AC
  Filled 2018-11-27: qty 5

## 2018-11-27 MED ORDER — CEFAZOLIN SODIUM-DEXTROSE 2-4 GM/100ML-% IV SOLN
2.0000 g | INTRAVENOUS | Status: AC
  Administered 2018-11-27: 2 g via INTRAVENOUS
  Filled 2018-11-27: qty 100

## 2018-11-27 MED ORDER — PROPOFOL 10 MG/ML IV BOLUS
INTRAVENOUS | Status: AC
  Filled 2018-11-27: qty 20

## 2018-11-27 MED ORDER — PROPOFOL 10 MG/ML IV BOLUS
INTRAVENOUS | Status: DC | PRN
  Administered 2018-11-27: 200 mg via INTRAVENOUS

## 2018-11-27 MED ORDER — MORPHINE SULFATE ER 100 MG PO TBCR
100.0000 mg | EXTENDED_RELEASE_TABLET | Freq: Two times a day (BID) | ORAL | Status: DC
  Administered 2018-11-27 – 2018-12-01 (×8): 100 mg via ORAL
  Filled 2018-11-27 (×8): qty 1

## 2018-11-27 MED ORDER — MIDAZOLAM HCL 2 MG/2ML IJ SOLN
INTRAMUSCULAR | Status: AC
  Filled 2018-11-27: qty 2

## 2018-11-27 MED ORDER — KETOROLAC TROMETHAMINE 15 MG/ML IJ SOLN
INTRAMUSCULAR | Status: AC
  Administered 2018-11-27: 13:00:00 15 mg
  Filled 2018-11-27: qty 1

## 2018-11-27 MED ORDER — ASPIRIN 81 MG PO CHEW
81.0000 mg | CHEWABLE_TABLET | Freq: Every day | ORAL | Status: DC
  Administered 2018-11-28 – 2018-12-01 (×4): 81 mg via ORAL
  Filled 2018-11-27 (×4): qty 1

## 2018-11-27 MED ORDER — METFORMIN HCL 500 MG PO TABS
1000.0000 mg | ORAL_TABLET | Freq: Two times a day (BID) | ORAL | Status: DC
  Administered 2018-11-27 – 2018-12-01 (×8): 1000 mg via ORAL
  Filled 2018-11-27 (×8): qty 2

## 2018-11-27 MED ORDER — OXYMETAZOLINE HCL 0.05 % NA SOLN
1.0000 | Freq: Two times a day (BID) | NASAL | Status: DC | PRN
  Administered 2018-11-30: 1 via NASAL
  Filled 2018-11-27 (×2): qty 30

## 2018-11-27 MED ORDER — VITAMIN B-12 100 MCG PO TABS
100.0000 ug | ORAL_TABLET | Freq: Every day | ORAL | Status: DC
  Administered 2018-11-28 – 2018-12-01 (×4): 100 ug via ORAL
  Filled 2018-11-27 (×4): qty 1

## 2018-11-27 MED ORDER — ONDANSETRON HCL 4 MG/2ML IJ SOLN: 4.0000 mg | Freq: Once | INTRAMUSCULAR | Status: DC | PRN

## 2018-11-27 MED ORDER — METOPROLOL TARTRATE 12.5 MG HALF TABLET
12.5000 mg | ORAL_TABLET | Freq: Two times a day (BID) | ORAL | Status: DC
  Administered 2018-11-27 – 2018-12-01 (×8): 12.5 mg via ORAL
  Filled 2018-11-27 (×11): qty 1

## 2018-11-27 MED ORDER — KETOROLAC TROMETHAMINE 15 MG/ML IJ SOLN
15.0000 mg | Freq: Four times a day (QID) | INTRAMUSCULAR | Status: AC
  Administered 2018-11-27 – 2018-11-28 (×5): 15 mg via INTRAVENOUS
  Filled 2018-11-27 (×5): qty 1

## 2018-11-27 MED ORDER — OXYCODONE HCL 5 MG PO TABS
10.0000 mg | ORAL_TABLET | ORAL | Status: DC | PRN
  Administered 2018-11-27 – 2018-12-01 (×21): 15 mg via ORAL
  Filled 2018-11-27 (×22): qty 3

## 2018-11-27 MED ORDER — ONDANSETRON HCL 4 MG PO TABS: 4.0000 mg | ORAL_TABLET | Freq: Four times a day (QID) | ORAL | Status: DC | PRN

## 2018-11-27 MED ORDER — VITAMIN D 25 MCG (1000 UNIT) PO TABS
1000.0000 [IU] | ORAL_TABLET | Freq: Every day | ORAL | Status: DC
  Administered 2018-11-28 – 2018-12-01 (×4): 1000 [IU] via ORAL
  Filled 2018-11-27 (×4): qty 1

## 2018-11-27 MED ORDER — PREGABALIN 75 MG PO CAPS: 75.0000 mg | ORAL_CAPSULE | Freq: Every day | ORAL | Status: DC

## 2018-11-27 MED ORDER — PREGABALIN 75 MG PO CAPS: 75.0000 mg | ORAL_CAPSULE | ORAL | Status: DC

## 2018-11-27 MED ORDER — RISAQUAD PO CAPS
1.0000 | ORAL_CAPSULE | Freq: Every day | ORAL | Status: DC
  Administered 2018-11-27 – 2018-12-01 (×5): 1 via ORAL
  Filled 2018-11-27 (×4): qty 1

## 2018-11-27 MED ORDER — OXYCODONE HCL 5 MG PO TABS
5.0000 mg | ORAL_TABLET | ORAL | Status: DC | PRN
  Administered 2018-11-27: 15:00:00 5 mg via ORAL
  Administered 2018-11-27: 10 mg via ORAL
  Filled 2018-11-27: qty 1

## 2018-11-27 MED ORDER — CEFAZOLIN SODIUM-DEXTROSE 2-4 GM/100ML-% IV SOLN
2.0000 g | Freq: Four times a day (QID) | INTRAVENOUS | Status: AC
  Administered 2018-11-27 – 2018-11-28 (×3): 2 g via INTRAVENOUS
  Filled 2018-11-27 (×3): qty 100

## 2018-11-27 MED ORDER — ACETAMINOPHEN 500 MG PO TABS
1000.0000 mg | ORAL_TABLET | Freq: Four times a day (QID) | ORAL | Status: AC
  Administered 2018-11-27 – 2018-11-28 (×3): 1000 mg via ORAL
  Filled 2018-11-27 (×3): qty 2

## 2018-11-27 MED ORDER — CLOPIDOGREL BISULFATE 75 MG PO TABS
75.0000 mg | ORAL_TABLET | Freq: Every day | ORAL | Status: DC
  Administered 2018-11-28 – 2018-12-01 (×4): 75 mg via ORAL
  Filled 2018-11-27 (×4): qty 1

## 2018-11-27 MED ORDER — LEFLUNOMIDE 20 MG PO TABS
20.0000 mg | ORAL_TABLET | Freq: Every day | ORAL | Status: DC
  Administered 2018-11-28 – 2018-12-01 (×4): 20 mg via ORAL
  Filled 2018-11-27 (×4): qty 1

## 2018-11-27 MED ORDER — LACTATED RINGERS IV SOLN
INTRAVENOUS | Status: DC
  Administered 2018-11-27: 10:00:00 via INTRAVENOUS

## 2018-11-27 MED ORDER — ONDANSETRON HCL 4 MG/2ML IJ SOLN
INTRAMUSCULAR | Status: DC | PRN
  Administered 2018-11-27: 4 mg via INTRAVENOUS

## 2018-11-27 MED ORDER — 0.9 % SODIUM CHLORIDE (POUR BTL) OPTIME
TOPICAL | Status: DC | PRN
  Administered 2018-11-27: 1000 mL

## 2018-11-27 MED ORDER — ACETAMINOPHEN 10 MG/ML IV SOLN
1000.0000 mg | Freq: Once | INTRAVENOUS | Status: AC
  Administered 2018-11-27: 13:00:00 1000 mg via INTRAVENOUS

## 2018-11-27 SURGICAL SUPPLY — 42 items
BLADE SAW RECIP 87.9 MT (BLADE) ×2 IMPLANT
BLADE SURG 21 STRL SS (BLADE) ×2 IMPLANT
BNDG COHESIVE 6X5 TAN STRL LF (GAUZE/BANDAGES/DRESSINGS) ×2 IMPLANT
CANISTER WOUND CARE 500ML ATS (WOUND CARE) IMPLANT
COVER SURGICAL LIGHT HANDLE (MISCELLANEOUS) ×2 IMPLANT
COVER WAND RF STERILE (DRAPES) IMPLANT
CUFF TOURN SGL QUICK 34 (TOURNIQUET CUFF)
CUFF TRNQT CYL 34X4.125X (TOURNIQUET CUFF) IMPLANT
DRAPE INCISE IOBAN 66X45 STRL (DRAPES) ×4 IMPLANT
DRAPE U-SHAPE 47X51 STRL (DRAPES) ×2 IMPLANT
DRESSING PREVENA PLUS CUSTOM (GAUZE/BANDAGES/DRESSINGS) ×1 IMPLANT
DRSG PREVENA PLUS CUSTOM (GAUZE/BANDAGES/DRESSINGS) ×2
DURAPREP 26ML APPLICATOR (WOUND CARE) ×2 IMPLANT
ELECT REM PT RETURN 9FT ADLT (ELECTROSURGICAL) ×2
ELECTRODE REM PT RTRN 9FT ADLT (ELECTROSURGICAL) ×1 IMPLANT
GLOVE BIOGEL PI IND STRL 7.5 (GLOVE) ×1 IMPLANT
GLOVE BIOGEL PI IND STRL 9 (GLOVE) ×1 IMPLANT
GLOVE BIOGEL PI INDICATOR 7.5 (GLOVE) ×1
GLOVE BIOGEL PI INDICATOR 9 (GLOVE) ×1
GLOVE SURG ORTHO 9.0 STRL STRW (GLOVE) ×2 IMPLANT
GLOVE SURG SS PI 6.5 STRL IVOR (GLOVE) ×2 IMPLANT
GOWN STRL REUS W/ TWL LRG LVL3 (GOWN DISPOSABLE) ×1 IMPLANT
GOWN STRL REUS W/ TWL XL LVL3 (GOWN DISPOSABLE) ×2 IMPLANT
GOWN STRL REUS W/TWL LRG LVL3 (GOWN DISPOSABLE) ×1
GOWN STRL REUS W/TWL XL LVL3 (GOWN DISPOSABLE) ×2
KIT BASIN OR (CUSTOM PROCEDURE TRAY) ×2 IMPLANT
KIT TURNOVER KIT B (KITS) ×2 IMPLANT
MANIFOLD NEPTUNE II (INSTRUMENTS) ×2 IMPLANT
NS IRRIG 1000ML POUR BTL (IV SOLUTION) ×2 IMPLANT
PACK ORTHO EXTREMITY (CUSTOM PROCEDURE TRAY) ×2 IMPLANT
PAD ARMBOARD 7.5X6 YLW CONV (MISCELLANEOUS) ×2 IMPLANT
PREVENA RESTOR ARTHOFORM 46X30 (CANNISTER) ×2 IMPLANT
PREVENA RESTOR AXIOFORM 29X28 (GAUZE/BANDAGES/DRESSINGS) ×1 IMPLANT
STAPLER VISISTAT 35W (STAPLE) IMPLANT
STOCKINETTE IMPERVIOUS LG (DRAPES) IMPLANT
SUT ETHILON 2 0 PSLX (SUTURE) ×4 IMPLANT
SUT SILK 2 0 (SUTURE) ×1
SUT SILK 2-0 18XBRD TIE 12 (SUTURE) ×1 IMPLANT
SUT VIC AB 1 CTX 27 (SUTURE) ×1 IMPLANT
TOWEL GREEN STERILE FF (TOWEL DISPOSABLE) ×2 IMPLANT
TUBE CONNECTING 20X1/4 (TUBING) ×2 IMPLANT
YANKAUER SUCT BULB TIP NO VENT (SUCTIONS) ×2 IMPLANT

## 2018-11-27 NOTE — Anesthesia Preprocedure Evaluation (Addendum)
Anesthesia Evaluation  Patient identified by MRN, date of birth, ID band Patient awake    Reviewed: Allergy & Precautions, NPO status , Patient's Chart, lab work & pertinent test results  Airway Mallampati: II  TM Distance: >3 FB Neck ROM: Full    Dental  (+) Poor Dentition, Edentulous Upper, Missing   Pulmonary COPD, former smoker,    Pulmonary exam normal breath sounds clear to auscultation       Cardiovascular hypertension, Pt. on medications and Pt. on home beta blockers + Peripheral Vascular Disease  Normal cardiovascular exam Rhythm:Regular Rate:Tachycardia  ECG: ST, rate 101   Neuro/Psych negative neurological ROS  negative psych ROS   GI/Hepatic negative GI ROS, (+)     substance abuse  ,   Endo/Other  diabetes, Insulin Dependent, Oral Hypoglycemic Agents  Renal/GU negative Renal ROS     Musculoskeletal negative musculoskeletal ROS (+) narcotic dependent  Abdominal   Peds  Hematology HLD   Anesthesia Other Findings Gangrene Right Below Knee Amputation  Reproductive/Obstetrics                            Anesthesia Physical Anesthesia Plan  ASA: III  Anesthesia Plan: General   Post-op Pain Management:    Induction: Intravenous  PONV Risk Score and Plan: 3 and Midazolam, Dexamethasone, Ondansetron and Treatment may vary due to age or medical condition  Airway Management Planned: Oral ETT  Additional Equipment:   Intra-op Plan:   Post-operative Plan: Extubation in OR  Informed Consent: I have reviewed the patients History and Physical, chart, labs and discussed the procedure including the risks, benefits and alternatives for the proposed anesthesia with the patient or authorized representative who has indicated his/her understanding and acceptance.     Dental advisory given  Plan Discussed with: CRNA  Anesthesia Plan Comments:        Anesthesia Quick  Evaluation

## 2018-11-27 NOTE — Op Note (Signed)
11/27/2018  12:39 PM  PATIENT:  Brian Zavala    PRE-OPERATIVE DIAGNOSIS:  Gangrene Right Below Knee Amputation  POST-OPERATIVE DIAGNOSIS:  Same  PROCEDURE:  RIGHT ABOVE KNEE AMPUTATION Application of restore and customizable wound VAC   SURGEON:  Newt Minion, MD  PHYSICIAN ASSISTANT:None ANESTHESIA:   General  PREOPERATIVE INDICATIONS:  Brian Zavala is a  68 y.o. male with a diagnosis of Gangrene Right Below Knee Amputation who failed conservative measures and elected for surgical management.    The risks benefits and alternatives were discussed with the patient preoperatively including but not limited to the risks of infection, bleeding, nerve injury, cardiopulmonary complications, the need for revision surgery, among others, and the patient was willing to proceed.  OPERATIVE IMPLANTS: Restore and customizable wound VAC  @ENCIMAGES @  OPERATIVE FINDINGS: Good contractility and color at the resection margins of the muscle.  OPERATIVE PROCEDURE: Patient was brought the operating room and underwent a general anesthetic.  After adequate levels anesthesia were obtained patient's right lower extremity was prepped using DuraPrep draped into a sterile field a timeout was called.  A fishmouth incision was made just proximal to the quad tendon.  This was carried down to the medial vascular bundle this was clamped and suture ligated with 2-0 silk.  The remainder of the fishmouth incision was completed the femur was resected with  a reciprocating saw.  Hemostasis was obtained the deep fascia layers were closed using 0 Vicryl skin was closed using 2-0 nylon and staples.  A customizable and restore VAC dressing were applied this had a good suction fit patient was extubated taken the PACU in stable condition.   DISCHARGE PLANNING:  Antibiotic duration: 24-hour antibiotics  Weightbearing: Nonweightbearing on the right  Pain medication: Opioid pathway  Dressing care/ Wound VAC:  Continue wound VAC for 1 week at discharge  Ambulatory devices: Walker or crutches  Discharge to: Possible discharge to inpatient rehab versus discharge to home pending therapy recommendations  Follow-up: In the office 1 week post operative.

## 2018-11-27 NOTE — H&P (Signed)
Brian Zavala is an 68 y.o. male.   Chief Complaint: Dehiscence right transtibial amputation HPI: The patient is a 68 year old gentleman who has undergone surgical and medical intervention for limb salvage of his right below the knee amputation who presents with wound dehiscence of the transtibial amputation site despite stent placements to open up the right SFA.  He presents today for a right above-the-knee amputation due to poor healing and insufficient circulation to heal his transtibial amputation site.  Past Medical History:  Diagnosis Date  . Arthritis    "pretty much all over"  . Cellulitis of left foot   . COPD (chronic obstructive pulmonary disease) ([place])   . Dyspnea   . Hyperlipidemia   . Hypertension   . Neuromuscular disorder (HCC)    neuropathy legs  . Neuropathy    legs  . Osteomyelitis ([place]) 04/2018   4th toe left foot  . Osteomyelitis of fourth toe of left foot ([place]) 04/19/2018  . Peripheral vascular disease ([place])   . Type II diabetes mellitus ([place])   . Wears glasses   . Wears partial dentures    top and bottom partials    Past Surgical History:  Procedure Laterality Date  . ABDOMINAL AORTOGRAM W/LOWER EXTREMITY Bilateral 10/19/2018   Procedure: ABDOMINAL AORTOGRAM W/LOWER EXTREMITY;  Surgeon: Lorretta Harp, MD;  Location: [facility];  Service: Cardiovascular;  Laterality: Bilateral;  . AMPUTATION Left 04/22/2018   Procedure: LEFT FOOT 4TH RAY AMPUTATION;  Surgeon: Newt Minion, MD;  Location: [facility];  Service: Orthopedics;  Laterality: Left;  . BACK SURGERY    . CHOLECYSTECTOMY N/A 10/26/2013   Procedure: LAPAROSCOPIC CHOLECYSTECTOMY WITH INTRAOPERATIVE CHOLANGIOGRAM;  Surgeon: Joyice Faster. Cornett, MD;  Location: [facility];  Service: General;  Laterality: N/A;  . COLONOSCOPY    . CYST EXCISION Bilateral 02/15/2015   Procedure: LEFT INDEX FINGER AND RIGHT MIDDLE FINGER NODULE EXCISION;  Surgeon: Leandrew Koyanagi, MD;  Location: [facility];   Service: Orthopedics;  Laterality: Bilateral;  . CYST EXCISION PERINEAL N/A 12/08/2012   Procedure: CYST EXCISION PERIN[place];  Surgeon: Marcello Moores A. Cornett, MD;  Location: [facility];  Service: General;  Laterality: N/A;  . FOOT AMPUTATION Right 2005  . I&D EXTREMITY Right 03/07/2017   Procedure: EXCISION FIBULAR HEAD RIGHT BELOW KNEE AMPUTATION;  Surgeon: Newt Minion, MD;  Location: [facility];  Service: Orthopedics;  Laterality: Right;  . INCISE AND DRAIN ABCESS  12/02/2014   PERINEAL ABSCESS  . INCISION AND DRAINAGE PERIRECTAL ABSCESS Left 12/02/2014   Procedure: IRRIGATION AND DEBRIDEMENT PERINEAL ABSCESS;  Surgeon: Coralie Keens, MD;  Location: [facility];  Service: General;  Laterality: Left;  . LEG AMPUTATION BELOW KNEE Right 2005  . LOWER EXTREMITY ANGIOGRAPHY N/A 05/05/2017   Procedure: LOWER EXTREMITY ANGIOGRAPHY;  Surgeon: Lorretta Harp, MD;  Location: [facility];  Service: Cardiovascular;  Laterality: N/A;  . LUMBAR DISC SURGERY  82,90   ruptured disc  . PERIPHERAL VASCULAR INTERVENTION Right 05/05/2017   Procedure: PERIPHERAL VASCULAR INTERVENTION;  Surgeon: Lorretta Harp, MD;  Location: [facility];  Service: Cardiovascular;  Laterality: Right;  . PERIPHERAL VASCULAR INTERVENTION Right 10/19/2018   Procedure: PERIPHERAL VASCULAR INTERVENTION;  Surgeon: Lorretta Harp, MD;  Location: [facility];  Service: Cardiovascular;  Laterality: Right;  . SCAR REVISION Right 2005   @ amputation  . STUMP REVISION Right 08/21/2018   Procedure: REVISION RIGHT BELOW KNEE AMPUTATION, EXCISION FIBULA;  Surgeon: Sharol Given,  Randa Evens, MD;  Location: [facility];  Service: Orthopedics;  Laterality: Right;  . STUMP REVISION Right 09/09/2018   Procedure: REVISION RIGHT BELOW KNEE AMPUTATION;  Surgeon: [name], MD;  Location: [facility];  Service: Orthopedics;  Laterality: Right;  . TOE AMPUTATION Right 2005    Family History  Problem Relation Age of Onset  . Cirrhosis Father     Social History:  reports that he quit smoking about 7 weeks ago. His smoking use included cigarettes. He has a 47.00 pack-year smoking history. He has never used smokeless tobacco. He reports that he does not drink alcohol or use drugs.  Allergies:  Allergies  Allergen Reactions  . Varenicline Nausea Only    No medications prior to admission.    Results for orders placed or performed during the hospital encounter of 11/26/18 (from the past 48 hour(s))  SARS CORONAVIRUS 2 (TAT 6-24 HRS) Nasopharyngeal Nasopharyngeal Swab     Status: None   Collection Time: 11/26/18 10:27 AM   Specimen: Nasopharyngeal Swab  Result Value Ref Range   SARS Coronavirus 2 NEGATIVE NEGATIVE    Comment: (NOTE) SARS-CoV-2 target nucleic acids are NOT DETECTED. The SARS-CoV-2 RNA is generally detectable in upper and lower respiratory specimens during the acute phase of infection. Negative results do not preclude SARS-CoV-2 infection, do not rule out co-infections with other pathogens, and should not be used as the sole basis for treatment or other patient management decisions. Negative results must be combined with clinical observations, patient history, and epidemiological information. The expected result is Negative. Fact Sheet for Patients: HairSlick.no Fact Sheet for Healthcare Providers: quierodirigir.com This test is not yet approved or cleared by the [place] FDA and  has been authorized for detection and/or diagnosis of SARS-CoV-2 by FDA under an Emergency Use Authorization (EUA). This EUA will remain  in effect (meaning this test can be used) for the duration of the COVID-19 declaration under Section 56 4(b)(1) of the Act, 21 U.S.C. section 360bbb-3(b)(1), unless the authorization is terminated or revoked sooner. Performed at [facility], [address] [place], [place] [address]    No results found.  Review of Systems   All other systems reviewed and are negative.   There were no vitals taken for this visit. Physical Exam  Constitutional: He is oriented to person, place, and time. He appears well-developed and well-nourished. No distress.  HENT:  Head: Normocephalic and atraumatic.  Neck: No tracheal deviation present. No thyromegaly present.  Cardiovascular: Normal rate.  Respiratory: Effort normal. No stridor. No respiratory distress.  GI: Soft.  Musculoskeletal:     Comments: Examination patient has a wound VAC in place he is on Keflex and clindamycin patient complains of global ischemic pain around the residual limb.  Neurological: He is alert and oriented to person, place, and time. No cranial nerve deficit.  Skin: Skin is warm.  Psychiatric: He has a normal mood and affect. His behavior is normal. Judgment and thought content normal.     Assessment/Plan Dehiscence Right transtibial amputation- Plan right above the knee amputation-the procedure and possible benefits and risks including the risk of infection, bleeding, neurovascular injury, and possible need for further surgery were discussed with the patient and his questions were answered to his satisfaction.  The patient wishes to proceed with surgery at this time.  Lazaro Arms, PA-C 11/27/2018, 7:18 AM [facility] [phone]

## 2018-11-27 NOTE — Telephone Encounter (Signed)
I see that Brian Zavala was readmitted today by Dr. Sharol Given because of relapse of his right BKA stump infection.  I have asked my partners to see him while he is at [facility].

## 2018-11-27 NOTE — Anesthesia Procedure Notes (Signed)
Procedure Name: Intubation Date/Time: 11/27/2018 11:55 AM Performed by: Lieutenant Diego, CRNA Pre-anesthesia Checklist: Patient identified, Emergency Drugs available, Suction available, Patient being monitored and Timeout performed Patient Re-evaluated:Patient Re-evaluated prior to induction Oxygen Delivery Method: Circle system utilized Preoxygenation: Pre-oxygenation with 100% oxygen Induction Type: IV induction Laryngoscope Size: Miller and 2 Grade View: Grade I Tube type: Oral Number of attempts: 1 Airway Equipment and Method: Stylet Placement Confirmation: ETT inserted through vocal cords under direct vision,  positive ETCO2 and breath sounds checked- equal and bilateral Secured at: 22 cm Tube secured with: Tape Dental Injury: Teeth and Oropharynx as per pre-operative assessment

## 2018-11-27 NOTE — Anesthesia Postprocedure Evaluation (Signed)
Anesthesia Post Note  Patient: Brian Zavala  Procedure(s) Performed: RIGHT ABOVE KNEE AMPUTATION (Right Knee)     Patient location during evaluation: PACU Anesthesia Type: General Level of consciousness: awake and alert Pain control: Treating pain. Vital Signs Assessment: post-procedure vital signs reviewed and stable Respiratory status: spontaneous breathing, nonlabored ventilation, respiratory function stable and patient connected to nasal cannula oxygen Cardiovascular status: blood pressure returned to baseline and stable Postop Assessment: no apparent nausea or vomiting Anesthetic complications: no    Last Vitals:  Vitals:   11/27/18 1355 11/27/18 1425  BP:  (!) 149/75  Pulse:  80  Resp:  16  Temp: 36.5 C 37.1 C  SpO2:  97%    Last Pain:  Vitals:   11/27/18 1425  TempSrc: Oral  PainSc: 10-Worst pain ever                 Ryan P Ellender

## 2018-11-27 NOTE — Transfer of Care (Signed)
Immediate Anesthesia Transfer of Care Note  Patient: Brian Zavala  Procedure(s) Performed: RIGHT ABOVE KNEE AMPUTATION (Right Knee)  Patient Location: PACU  Anesthesia Type:General  Level of Consciousness: awake, alert  and oriented  Airway & Oxygen Therapy: Patient Spontanous Breathing and Patient connected to face mask oxygen  Post-op Assessment: Report given to RN and Post -op Vital signs reviewed and stable  Post vital signs: Reviewed and stable  Last Vitals:  Vitals Value Taken Time  BP 127/72 11/27/18 1238  Temp    Pulse 86 11/27/18 1241  Resp 24 11/27/18 1241  SpO2 97 % 11/27/18 1241  Vitals shown include unvalidated device data.  Last Pain:  Vitals:   11/27/18 0948  TempSrc: Oral  PainSc: 7       Patients Stated Pain Goal: 2 (27/63/94 3200)  Complications: No apparent anesthesia complications

## 2018-11-27 NOTE — Consult Note (Signed)
[facility] for Infectious Disease       Reason for Consult: BKA stump infection    Referring Physician: Dr. Sharol Given  Active Problems:   Acquired absence of right leg above knee ([place])   . acetaminophen  1,000 mg Oral Q6H  . acidophilus  1 capsule Oral Daily  . [START ON 11/28/2018] aspirin  81 mg Oral Daily  . atorvastatin  80 mg Oral q1800  . [START ON 11/28/2018] calcium carbonate  1 tablet Oral Q breakfast  . [START ON 11/28/2018] cholecalciferol  1,000 Units Oral Daily  . [START ON 11/28/2018] clopidogrel  75 mg Oral Q breakfast  . docusate sodium  100 mg Oral BID  . fentaNYL      . furosemide  20 mg Oral Daily  . hydrochlorothiazide  12.5 mg Oral Daily  . insulin aspart  0-9 Units Subcutaneous TID WC  . [START ON 11/28/2018] insulin glargine  60 Units Subcutaneous Q lunch  . ketorolac  15 mg Intravenous Q6H  . [START ON 11/28/2018] leflunomide  20 mg Oral Daily  . metFORMIN  1,000 mg Oral BID WC  . metoprolol tartrate  12.5 mg Oral BID  . morphine  100 mg Oral Q12H  . oxyCODONE      . [START ON 11/28/2018] predniSONE  2 mg Oral Q breakfast  . pregabalin  150 mg Oral BID  . 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  He has grown MSSA in previous cultures.  At this point though his stump infection has been amputated with adequate source control.  He can continue with antibiotics for another 24 hours and stop.    Dr. Tommy Medal is available over the weekend if needed Otherwise will sign off  Antibiotics: cefazolin  HPI: Brian Zavala is a 68 y.o. male with severe PAD, DM and history of BKA of his right leg in 2005.  In January 2019 he developed pain and drainge and this was revised with excision of part of the fibular head.  Again in June of this year he developed pain and  swelling and required revision in June and July.  He was seen by my partner Dr. Megan Salon again in July and completed 6 weeks of IV cefazolin followed by Keflex with doxycycline added and has remained on this.  He also had a right stent to open SFA but did not improve his healing.  He saw Dr. Lucia Gaskins earlier this month and noted a knee effusion and aspirated with 23,000 WBCs and sent back to Dr. Sharol Given.  He came back for surgical management today and underwent AKA with VAC placement.    Review of Systems:  Constitutional: negative for fevers, chills and anorexia Gastrointestinal: negative for nausea and diarrhea Musculoskeletal: positive for pain in right leg All other systems reviewed and are negative    Past Medical History:  Diagnosis Date  . Arthritis    "pretty much all over"  . Cellulitis of left foot   . COPD (chronic obstructive pulmonary disease) ([place])   . Dyspnea   . Hyperlipidemia   . Hypertension   . Neuromuscular disorder (HCC)    neuropathy legs  . Neuropathy    legs  . Osteomyelitis ([place]) 04/2018   4th toe left foot  . Osteomyelitis of fourth  toe of left foot (HCC) 04/19/2018  . Peripheral vascular disease (HCC)   . Type II diabetes mellitus (HCC)   . Wears glasses   . Wears partial dentures    top and bottom partials    Social History   Tobacco Use  . Smoking status: Former Smoker    Packs/day: 1.00    Years: 47.00    Pack years: 47.00    Types: Cigarettes    Quit date: 10/03/2018    Years since quitting: 0.1  . Smokeless tobacco: Never Used  Substance Use Topics  . Alcohol use: No  . Drug use: No    Family History  Problem Relation Age of Onset  . Cirrhosis Father     Allergies  Allergen Reactions  . Varenicline Nausea Only    Physical Exam: Constitutional: alert  Vitals:   11/27/18 1355 11/27/18 1425  BP:  (!) 149/75  Pulse:  80  Resp:  16  Temp: 97.7 F (36.5 C) 98.8 F (37.1 C)  SpO2:  97%   EYES: anicteric Cardiovascular: Cor RRR  Respiratory: CTA B; normal respiratory effort GI: soft, nt Musculoskeletal: right leg wrapped, VAC in place Skin: no rash Neuro: non-focal  Lab Results  Component Value Date   WBC 9.0 11/27/2018   HGB 11.4 (L) 11/27/2018   HCT 34.7 (L) 11/27/2018   MCV 83.4 11/27/2018   PLT 408 (H) 11/27/2018    Lab Results  Component Value Date   CREATININE 0.62 11/27/2018   BUN 11 11/27/2018   NA 134 (L) 11/27/2018   K 3.6 11/27/2018   CL 97 (L) 11/27/2018   CO2 27 11/27/2018    Lab Results  Component Value Date   ALT 18 04/19/2018   AST 18 04/19/2018   ALKPHOS 51 04/19/2018     Microbiology: Recent Results (from the past 240 hour(s))  SARS CORONAVIRUS 2 (TAT 6-24 HRS) Nasopharyngeal Nasopharyngeal Swab     Status: None   Collection Time: 11/26/18 10:27 AM   Specimen: Nasopharyngeal Swab  Result Value Ref Range Status   SARS Coronavirus 2 NEGATIVE NEGATIVE Final    Comment: (NOTE) SARS-CoV-2 target nucleic acids are NOT DETECTED. The SARS-CoV-2 RNA is generally detectable in upper and lower respiratory specimens during the acute phase of infection. Negative results do not preclude SARS-CoV-2 infection, do not rule out co-infections with other pathogens, and should not be used as the sole basis for treatment or other patient management decisions. Negative results must be combined with clinical observations, patient history, and epidemiological information. The expected result is Negative. Fact Sheet for Patients: HairSlick.no Fact Sheet for Healthcare Providers: quierodirigir.com This test is not yet approved or cleared by the [place] FDA and  has been authorized for detection and/or diagnosis of SARS-CoV-2 by FDA under an Emergency Use Authorization (EUA). This EUA will remain  in effect (meaning this test can be used) for the duration of the COVID-19 declaration under Section 56 4(b)(1) of the Act, 21 U.S.C.  section 360bbb-3(b)(1), unless the authorization is terminated or revoked sooner. Performed at [facility], [address], [place], [place] [address]     Gardiner Barefoot, MD [facility] [email or web address] 11/27/2018, 3:34 PM

## 2018-11-27 NOTE — Progress Notes (Signed)
Physical medicine rehab consult requested.  Patient currently in PACU right AKA 11/27/2018.  Will await physical and occupational therapy evaluations and follow-up with appropriate recommendations.

## 2018-11-28 ENCOUNTER — Encounter (HOSPITAL_COMMUNITY): Payer: Self-pay | Admitting: Orthopedic Surgery

## 2018-11-28 LAB — GLUCOSE, CAPILLARY
Glucose-Capillary: 107 mg/dL — ABNORMAL HIGH (ref 70–99)
Glucose-Capillary: 187 mg/dL — ABNORMAL HIGH (ref 70–99)
Glucose-Capillary: 149 mg/dL — ABNORMAL HIGH (ref 70–99)
Glucose-Capillary: 119 mg/dL — ABNORMAL HIGH (ref 70–99)

## 2018-11-28 NOTE — Evaluation (Signed)
Occupational Therapy Evaluation Patient Details Name: Brian Zavala MRN: [id number] DOB: 12-May-1950 Today's Date: 11/28/2018    History of Present Illness Pt is a 68 y.o. male admitted on 9/25 with infection after recent revision of R transtibial amputation (09/09/2018 and6/19/20). 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   Clinical Impression   Pt admitted with see above. Pt is at baseline for level of function as mostly dependent on using power chair and RW for transfer to surface. Patient at this time reported they will have support with family if required. Pt was able to compelte bed mobility with no assist and transfer to chair with RW with MOD I. Patient was able to complete LE dressing with MOD I and increase time for position.       Follow Up Recommendations  No OT follow up    Equipment Recommendations  None recommended by OT    Recommendations for Other Services       Precautions / Restrictions Precautions Precautions: Fall Restrictions Weight Bearing Restrictions: Yes RLE Weight Bearing: Non weight bearing      Mobility Bed Mobility Overal bed mobility: Independent             General bed mobility comments: able to getot the edge of the bed without assistance and scoot to the edge of bed   Transfers Overall transfer level: Modified independent Equipment used: Rolling walker (2 wheeled) Transfers: Sit to/from Stand Sit to Stand: Modified independent (Device/Increase time) Stand pivot transfers: Modified independent (Device/Increase time)            Balance                                           ADL either performed or assessed with clinical judgement   ADL Overall ADL's : At baseline                                       General ADL Comments: set up at home with all needed DME as needed     Vision Baseline  Vision/History: Wears glasses Patient Visual Report: No change from baseline Vision Assessment?: No apparent visual deficits     Perception Perception Perception Tested?: No   Praxis Praxis Praxis tested?: Not tested    Pertinent Vitals/Pain Pain Assessment: 0-10 Pain Score: 4  Pain Location: R residual limb.  Pain Descriptors / Indicators: Aching Pain Intervention(s): Monitored during session;Repositioned     Hand Dominance Right   Extremity/Trunk Assessment Upper Extremity Assessment Upper Extremity Assessment: Overall WFL for tasks assessed   Lower Extremity Assessment Lower Extremity Assessment: Defer to PT evaluation   Cervical / Trunk Assessment Cervical / Trunk Assessment: Normal   Communication Communication Communication: No difficulties   Cognition Arousal/Alertness: Awake/alert Behavior During Therapy: WFL for tasks assessed/performed Overall Cognitive Status: Within Functional Limits for tasks assessed                                     General Comments       Exercises     Shoulder Instructions      Home Living Family/patient expects to be discharged to::  Private residence Living Arrangements: Alone Available Help at Discharge: Family;Available PRN/intermittently Type of Home: House Home Access: Ramped entrance     Home Layout: One level     Bathroom Shower/Tub: Chief Strategy Officer: Standard Bathroom Accessibility: Yes How Accessible: Other (comment)(power chair) Home Equipment: Walker - 2 wheels;Wheelchair - Engineer, technical sales - power;Shower seat   Additional Comments: Sister lives across street and can help if needed      Prior Functioning/Environment Level of Independence: Independent with assistive device(s)        Comments: Uses power w/c for majority of mobility; can stand and hop with RW. Drives.        OT Problem List: Decreased strength;Impaired balance (sitting and/or standing);Pain       OT Treatment/Interventions:      OT Goals(Current goals can be found in the care plan section) Acute Rehab OT Goals Patient Stated Goal: to go home  OT Goal Formulation: With patient Time For Goal Achievement: 12/05/18 Potential to Achieve Goals: Good  OT Frequency:     Barriers to D/C:            Co-evaluation              AM-PAC OT "6 Clicks" Daily Activity     Outcome Measure Help from another person eating meals?: None Help from another person taking care of personal grooming?: None Help from another person toileting, which includes using toliet, bedpan, or urinal?: None Help from another person bathing (including washing, rinsing, drying)?: None Help from another person to put on and taking off regular upper body clothing?: None Help from another person to put on and taking off regular lower body clothing?: None 6 Click Score: 24   End of Session Equipment Utilized During Treatment: Gait belt;Rolling walker  Activity Tolerance: Patient tolerated treatment well Patient left: in bed;with call bell/phone within reach;with family/visitor present  OT Visit Diagnosis: Unsteadiness on feet (R26.81);Pain;Muscle weakness (generalized) (M62.81)                Time: 6294-7654 OT Time Calculation (min): 19 min Charges:  OT General Charges $OT Visit: 1 Visit OT Evaluation $OT Eval Low Complexity: 1 Low  Alphia Moh OTR/L  Acute Rehab Services  [phone] office number [phone] pager number   Alphia Moh 11/28/2018, 3:55 PM

## 2018-11-28 NOTE — Evaluation (Signed)
Physical Therapy Evaluation Patient Details Name: Brian Zavala MRN: [id number] DOB: 03-06-1950 Today's Date: 11/28/2018   History of Present Illness  Pt is a 68 y.o. male admitted on 9/25 with infection after recent revision of R transtibial amputation (09/09/2018 and6/19/20). 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  Clinical Impression  Patient is near his baseline mobility. His pain was well controled during treatment. He was able to transfer with guarding. At baseline he used a power wheelchair for mobility He was able to safely transfer to his recliner. Therapy reviewed light stretching for his residual limb and posterior chain muscle activation. Acute therapy will continue to work with him while he is admitted but he will Liley not require any follow up after discharge.     Follow Up Recommendations No PT follow up    Equipment Recommendations  None recommended by PT    Recommendations for Other Services       Precautions / Restrictions Precautions Precautions: Fall Restrictions Weight Bearing Restrictions: Yes RLE Weight Bearing: Non weight bearing      Mobility  Bed Mobility Overal bed mobility: Independent             General bed mobility comments: able to getot the edge of the bed without assistance and scoot to the edge of bed   Transfers Overall transfer level: Needs assistance Equipment used: Rolling walker (2 wheeled) Transfers: Sit to/from [employer] Sit to Stand: Min guard Stand pivot transfers: Min guard       General transfer comment: Min gaurd for intial balance but able to hop to the chair without difficulty. Per patient he has been desling with this for a while.   Ambulation/Gait                Stairs            Wheelchair Mobility    Modified Rankin (Stroke Patients Only)       Balance                                             Pertinent Vitals/Pain Pain Assessment: Faces Faces Pain Scale: Hurts a little bit Pain Location: R residual limb.  Pain Descriptors / Indicators: Aching Pain Intervention(s): Limited activity within patient's tolerance    Home Living Family/patient expects to be discharged to:: Private residence Living Arrangements: Alone Available Help at Discharge: Family;Available PRN/intermittently Type of Home: House Home Access: Ramped entrance     Home Layout: One level Home Equipment: Walker - 2 wheels;Wheelchair - Engineer, technical sales - power;Shower seat Additional Comments: Sister lives across street and can help if needed    Prior Function Level of Independence: Independent with assistive device(s)         Comments: Uses power w/c for majority of mobility; can stand and hop with RW. Drives.     Hand Dominance   Dominant Hand: Right    Extremity/Trunk Assessment   Upper Extremity Assessment Upper Extremity Assessment: Overall WFL for tasks assessed(Patient has RA; limited strength in his hands )    Lower Extremity Assessment Lower Extremity Assessment: RLE deficits/detail RLE Deficits / Details: Right residual limb        Communication   Communication: No difficulties  Cognition Arousal/Alertness: Awake/alert Behavior During Therapy: WFL for tasks assessed/performed Overall Cognitive  Status: Within Functional Limits for tasks assessed                                        General Comments General comments (skin integrity, edema, etc.): residual limb was flexed up. Reviewed with patient how to gentley stretch it down and how to engage his glute muscle to put him in a good position for a prosthetic int he future.     Exercises     Assessment/Plan    PT Assessment Patient needs continued PT services  PT Problem List Decreased strength;Decreased range of motion;Decreased activity tolerance;Decreased  mobility;Pain       PT Treatment Interventions DME instruction;Gait training;Functional mobility training;Therapeutic activities;Therapeutic exercise;Neuromuscular re-education;Patient/family education    PT Goals (Current goals can be found in the Care Plan section)  Acute Rehab PT Goals Patient Stated Goal: to go home  PT Goal Formulation: With patient/family Time For Goal Achievement: 12/05/18 Potential to Achieve Goals: Good    Frequency Min 2X/week   Barriers to discharge        Co-evaluation               AM-PAC PT "6 Clicks" Mobility  Outcome Measure Help needed turning from your back to your side while in a flat bed without using bedrails?: None Help needed moving from lying on your back to sitting on the side of a flat bed without using bedrails?: None Help needed moving to and from a bed to a chair (including a wheelchair)?: A Little Help needed standing up from a chair using your arms (e.g., wheelchair or bedside chair)?: A Little Help needed to walk in hospital room?: A Little Help needed climbing 3-5 steps with a railing? : A Little 6 Click Score: 20    End of Session Equipment Utilized During Treatment: Gait belt Activity Tolerance: Patient tolerated treatment well Patient left: in chair;with call bell/phone within reach;with family/visitor present Nurse Communication: Mobility status PT Visit Diagnosis: Muscle weakness (generalized) (M62.81)    Time: 6599-3570 PT Time Calculation (min) (ACUTE ONLY): 20 min   Charges:   PT Evaluation $PT Eval Moderate Complexity: 1 Mod          Carney Living PT DPT  11/28/2018, 11:33 AM

## 2018-11-29 LAB — GLUCOSE, CAPILLARY
Glucose-Capillary: 56 mg/dL — ABNORMAL LOW (ref 70–99)
Glucose-Capillary: 141 mg/dL — ABNORMAL HIGH (ref 70–99)
Glucose-Capillary: 138 mg/dL — ABNORMAL HIGH (ref 70–99)
Glucose-Capillary: 194 mg/dL — ABNORMAL HIGH (ref 70–99)

## 2018-11-29 MED ORDER — HYDROMORPHONE HCL 1 MG/ML IJ SOLN
1.0000 mg | Freq: Once | INTRAMUSCULAR | Status: AC
  Administered 2018-11-29: 1 mg via INTRAVENOUS

## 2018-11-29 NOTE — Progress Notes (Signed)
     Subjective: 2 Days Post-Op Procedure(s) (LRB): RIGHT ABOVE KNEE AMPUTATION (Right) Awake, alert and oriented x4. He is painful right AKA residual limb over the outside of the [place] dressing. Last evening the VAC suction apparently was off and this was restarted this AM with severe pain requiring a couple of IV dilaudid boluses but presently the pain is much better and his tolerating the discomfort. He is wanting to stay for pain control and I will keep him with the plan to discharge in AM 9/28.  Patient reports pain as marked.    Objective:   VITALS:  Temp:  [98 F (36.7 C)-98.2 F (36.8 C)] 98.1 F (36.7 C) (09/27 1026) Pulse Rate:  [79-100] 100 (09/27 1026) Resp:  [17-18] 18 (09/27 0405) BP: (138-171)/(69-83) 171/83 (09/27 1026) SpO2:  [96 %-97 %] 97 % (09/27 1026)  Neurologically intact ABD soft Neurovascular intact Incision: scant drainage Compartment soft VAC intact and drawing negative pressure.   LABS Recent Labs    11/27/18 0931  HGB 11.4*  WBC 9.0  PLT 408*   Recent Labs    11/27/18 0931  NA 134*  K 3.6  CL 97*  CO2 27  BUN 11  CREATININE 0.62  GLUCOSE 193*   No results for input(s): LABPT, INR in the last 72 hours.   Assessment/Plan: 2 Days Post-Op Procedure(s) (LRB): RIGHT ABOVE KNEE AMPUTATION (Right)  Advance diet Up with therapy Plan for discharge tomorrow  Brian Zavala 11/29/2018, 2:42 PMPatient ID: Brian Zavala, male   DOB: [birth year], 68 y.o.   MRN: [id number]

## 2018-11-29 NOTE — Plan of Care (Signed)
  Problem: Education: Goal: Knowledge of General Education information will improve Description: Including pain rating scale, medication(s)/side effects and non-pharmacologic comfort measures Outcome: Progressing   Problem: Health Behavior/Discharge Planning: Goal: Ability to manage health-related needs will improve Outcome: Progressing   Problem: Clinical Measurements: Goal: Will remain free from infection Outcome: Progressing   Problem: Activity: Goal: Risk for activity intolerance will decrease Outcome: Progressing   Problem: Coping: Goal: Level of anxiety will decrease Outcome: Progressing   Problem: Pain Managment: Goal: General experience of comfort will improve Outcome: Progressing   Problem: Safety: Goal: Ability to remain free from injury will improve Outcome: Progressing   Problem: Skin Integrity: Goal: Risk for impaired skin integrity will decrease Outcome: Progressing   

## 2018-11-30 ENCOUNTER — Ambulatory Visit: Payer: Medicare (Managed Care) | Admitting: Internal Medicine

## 2018-11-30 LAB — GLUCOSE, CAPILLARY
Glucose-Capillary: 248 mg/dL — ABNORMAL HIGH (ref 70–99)
Glucose-Capillary: 204 mg/dL — ABNORMAL HIGH (ref 70–99)
Glucose-Capillary: 172 mg/dL — ABNORMAL HIGH (ref 70–99)
Glucose-Capillary: 225 mg/dL — ABNORMAL HIGH (ref 70–99)

## 2018-11-30 MED ORDER — HYDROMORPHONE HCL 1 MG/ML IJ SOLN
1.0000 mg | Freq: Once | INTRAMUSCULAR | Status: AC
  Administered 2018-11-30: 1 mg via INTRAVENOUS

## 2018-11-30 MED ORDER — KETOROLAC TROMETHAMINE 15 MG/ML IJ SOLN
15.0000 mg | Freq: Four times a day (QID) | INTRAMUSCULAR | Status: DC
  Administered 2018-11-30 – 2018-12-01 (×6): 15 mg via INTRAVENOUS
  Filled 2018-11-30 (×6): qty 1

## 2018-11-30 NOTE — Progress Notes (Signed)
Patient ID: Brian Zavala, male   DOB: 1950/08/21, 68 y.o.   MRN: [id number] Patient is status post above-knee amputation the right for peripheral vascular disease.  Patient states his pain is manageable during the day and at night but he states he has increased pain in the morning.  Orders are written for Toradol.  Plan for reevaluation for discharge to home tomorrow morning.  There is no drainage in the wound VAC canister.

## 2018-11-30 NOTE — Plan of Care (Signed)
  Problem: Health Behavior/Discharge Planning: Goal: Ability to manage health-related needs will improve Outcome: Progressing   Problem: Activity: Goal: Risk for activity intolerance will decrease Outcome: Progressing   Problem: Pain Managment: Goal: General experience of comfort will improve Outcome: Progressing   

## 2018-11-30 NOTE — Progress Notes (Signed)
Physical medicine rehab consult requested patient followed with therapy evaluations completed recommendations discharge to home with home health therapies as needed.

## 2018-12-01 ENCOUNTER — Ambulatory Visit (HOSPITAL_COMMUNITY): Payer: Medicare (Managed Care)

## 2018-12-01 LAB — GLUCOSE, CAPILLARY
Glucose-Capillary: 114 mg/dL — ABNORMAL HIGH (ref 70–99)
Glucose-Capillary: 60 mg/dL — ABNORMAL LOW (ref 70–99)
Glucose-Capillary: 155 mg/dL — ABNORMAL HIGH (ref 70–99)

## 2018-12-01 MED ORDER — INSULIN GLARGINE 100 UNIT/ML ~~LOC~~ SOLN
55.0000 [IU] | Freq: Every day | SUBCUTANEOUS | Status: DC
  Administered 2018-12-01: 13:00:00 55 [IU] via SUBCUTANEOUS
  Filled 2018-12-01: qty 0.55

## 2018-12-01 MED ORDER — OXYCODONE HCL 10 MG PO TABS: 10.0000 mg | ORAL_TABLET | ORAL | 0 refills | Status: DC | PRN

## 2018-12-01 NOTE — Progress Notes (Signed)
Physical Therapy Treatment Patient Details Name: Brian Zavala MRN: [id number] DOB: 08-05-1950 Today's Date: 12/01/2018    History of Present Illness Pt is a 68 y.o. male admitted on 9/25 with infection after recent revision of R transtibial amputation (09/09/2018 and6/19/20). 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    PT Comments    Pt performed supine LE exercises and transfer OOB to recliner chair.  He was visibly fatigued after session.  Pt is eager to return home and waiting to speak with MD.  Will continue to follow acutely.     Follow Up Recommendations  No PT follow up     Equipment Recommendations  None recommended by PT    Recommendations for Other Services       Precautions / Restrictions Precautions Precautions: Fall Restrictions Weight Bearing Restrictions: Yes RLE Weight Bearing: Non weight bearing    Mobility  Bed Mobility Overal bed mobility: Independent             General bed mobility comments: assistance only to manage wound vac line  Transfers Overall transfer level: Modified independent Equipment used: Rolling walker (2 wheeled) Transfers: Sit to/from Stand Sit to Stand: Modified independent (Device/Increase time) Stand pivot transfers: Min guard       General transfer comment: Pt from elevated surface height pushing down into device for support.  He performed safely.  min guard for pivot.  Performed 2-3 hops steps during pivot.  Ambulation/Gait                 Stairs             Wheelchair Mobility    Modified Rankin (Stroke Patients Only)       Balance Overall balance assessment: Needs assistance   Sitting balance-Leahy Scale: Good       Standing balance-Leahy Scale: Fair                              Cognition Arousal/Alertness: Awake/alert Behavior During Therapy: WFL for tasks  assessed/performed Overall Cognitive Status: Within Functional Limits for tasks assessed                                        Exercises General Exercises - Lower Extremity Ankle Circles/Pumps: AROM;Left;20 reps;Supine Quad Sets: AROM;Left;10 reps;Supine Heel Slides: AROM;Left;10 reps;Supine Hip ABduction/ADduction: AROM;Left;10 reps;Supine Straight Leg Raises: AAROM;Left;10 reps;Supine;Limitations Straight Leg Raises Limitations: minor extensor lag Amputee Exercises Hip Extension: AROM;Right;10 reps;Sidelying Hip ABduction/ADduction: AROM;Right;10 reps;Sidelying Hip Flexion/Marching: AROM;Right;10 reps;Sidelying Chair Push Up: AROM;10 reps    General Comments        Pertinent Vitals/Pain Pain Assessment: 0-10 Pain Score: 4  Pain Location: R residual limb.  Pain Descriptors / Indicators: Aching Pain Intervention(s): Monitored during session    Home Living                      Prior Function            PT Goals (current goals can now be found in the care plan section) Acute Rehab PT Goals Patient Stated Goal: to go home  Potential to Achieve Goals: Good    Frequency    Min 2X/week      PT Plan Current plan remains appropriate    Co-evaluation  AM-PAC PT "6 Clicks" Mobility   Outcome Measure  Help needed turning from your back to your side while in a flat bed without using bedrails?: None Help needed moving from lying on your back to sitting on the side of a flat bed without using bedrails?: None Help needed moving to and from a bed to a chair (including a wheelchair)?: A Little Help needed standing up from a chair using your arms (e.g., wheelchair or bedside chair)?: A Little Help needed to walk in hospital room?: A Little Help needed climbing 3-5 steps with a railing? : A Little 6 Click Score: 20    End of Session Equipment Utilized During Treatment: Gait belt Activity Tolerance: Patient tolerated treatment  well Patient left: in chair;with call bell/phone within reach;with family/visitor present Nurse Communication: Mobility status PT Visit Diagnosis: Muscle weakness (generalized) (M62.81)     Time: 8889-1694 PT Time Calculation (min) (ACUTE ONLY): 18 min  Charges:  $Therapeutic Exercise: 8-22 mins                     Governor Rooks, PTA Acute Rehabilitation Services Pager [phone] Office [phone]      Eli Hose 12/01/2018, 3:06 PM

## 2018-12-01 NOTE — Progress Notes (Signed)
Inpatient Diabetes Program Recommendations  AACE/ADA: New Consensus Statement on Inpatient Glycemic Control (2015)  Target Ranges:  Prepandial:   less than 140 mg/dL      Peak postprandial:   less than 180 mg/dL (1-2 hours)      Critically ill patients:  140 - 180 mg/dL   Lab Results  Component Value Date   GLUCAP 114 (H) 12/01/2018   HGBA1C 9.0 (H) 11/27/2018    Review of Glycemic Control Results for Brian Zavala, Brian Zavala (MRN [id number]) as of 12/01/2018 10:38  Ref. Range 11/30/2018 12:05 11/30/2018 16:48 11/30/2018 20:39 12/01/2018 06:42 12/01/2018 07:08  Glucose-Capillary Latest Ref Range: 70 - 99 mg/dL 204 (H) 172 (H) 225 (H) 60 (L) 114 (H)   Diabetes history: DM2 Outpatient Diabetes medications: Toujeo 80 units q noon + Victoza 1.8 qd + Metformin 1 gm bid Current orders for Inpatient glycemic control: Lantus 60 units + Metformin 1 gm bid + Novolog sensitive tid  Inpatient Diabetes Program Recommendations:   Fasting CBG 60. -Decrease Lantus to 55 units daily  Thank you, Bethena Roys E. Hanks, RN, MSN, CDE  Diabetes Coordinator Inpatient Glycemic Control Team Team Pager [phone] (8am-5pm) 12/01/2018 10:44 AM

## 2018-12-01 NOTE — Progress Notes (Signed)
Pt given discharge instructions and gone over with him. All questions answered. Wound vac switched to Prevena per order. All belongings gathered to be sent home.

## 2018-12-01 NOTE — Discharge Summary (Addendum)
Discharge Diagnoses:  Active Problems:   Acquired absence of right leg above knee [facility](HCC)   Surgeries: Procedure(s): RIGHT ABOVE KNEE AMPUTATION on 11/27/2018    Consultants:   Discharged Condition: Improved  Hospital Course: Brian RanaDavid K Zavala is an 68 y.o. male who was admitted 11/27/2018 with a chief complaint of peripheral vascular disease with dehiscence right below the knee amputation, with a final diagnosis of Gangrene Right Below Knee Amputation.  Patient was brought to the operating room on 11/27/2018 and underwent Procedure(s): RIGHT ABOVE KNEE AMPUTATION.    Patient was given perioperative antibiotics:  Anti-infectives (From admission, onward)   Start     Dose/Rate Route Frequency Ordered Stop   11/27/18 1800  ceFAZolin (ANCEF) IVPB 2g/100 mL premix     2 g 200 mL/hr over 30 Minutes Intravenous Every 6 hours 11/27/18 1408 11/28/18 0623   11/27/18 0930  ceFAZolin (ANCEF) IVPB 2g/100 mL premix     2 g 200 mL/hr over 30 Minutes Intravenous On call to O.R. 11/27/18 [id number] 11/27/18 1210    .  Patient was given sequential compression devices, early ambulation, and aspirin for DVT prophylaxis.  Recent vital signs:  Patient Vitals for the past 24 hrs:  BP Temp Temp src Pulse Resp SpO2  12/01/18 1502 135/75 98 F (36.7 C) Oral 85 - 98 %  12/01/18 1208 - - - 87 - -  12/01/18 1007 123/64 98.6 F (37 C) Oral 79 - 98 %  12/01/18 0736 (!) 154/79 98.4 F (36.9 C) Oral 95 18 97 %  12/01/18 0444 113/74 98 F (36.7 C) Oral 75 19 97 %  11/30/18 1957 (!) 141/84 98.5 F (36.9 C) Oral 84 16 94 %  .  Recent laboratory studies: No results found.  Discharge Medications:   Allergies as of 12/01/2018      Reactions   Varenicline Nausea Only      Medication List    STOP taking these medications   cephALEXin 500 MG capsule Commonly known as: KEFLEX   oxyCODONE-acetaminophen 10-325 MG tablet Commonly known as: PERCOCET     TAKE these medications   aspirin 81 MG chewable  tablet Chew 81 mg by mouth daily.   atorvastatin 80 MG tablet Commonly known as: LIPITOR Take 1 tablet (80 mg total) by mouth daily at 6 PM.   calcium carbonate 1500 (600 Ca) MG Tabs tablet Commonly known as: OSCAL Take 600 mg of elemental calcium by mouth daily with breakfast.   cholecalciferol 1000 units tablet Commonly known as: VITAMIN D Take 1,000 Units by mouth daily.   clopidogrel 75 MG tablet Commonly known as: PLAVIX Take 1 tablet (75 mg total) by mouth daily with breakfast.   docusate sodium 100 MG capsule Commonly known as: COLACE Take 100 mg by mouth 2 (two) times daily.   furosemide 20 MG tablet Commonly known as: LASIX Take 20 mg by mouth daily.   hydrochlorothiazide 12.5 MG capsule Commonly known as: MICROZIDE Take 12.5 mg by mouth daily.   leflunomide 20 MG tablet Commonly known as: ARAVA Take 20 mg by mouth daily.   metFORMIN 1000 MG tablet Commonly known as: GLUCOPHAGE Take 1,000 mg by mouth 2 (two) times daily.   metoprolol tartrate 25 MG tablet Commonly known as: LOPRESSOR Take 0.5 tablets (12.5 mg total) by mouth 2 (two) times daily.   morphine 100 MG 12 hr tablet Commonly known as: MS CONTIN Take 100 mg by mouth every 12 (twelve) hours.   Oxycodone HCl 10 MG Tabs Take  1-1.5 tablets (10-15 mg total) by mouth every 4 (four) hours as needed for severe pain (pain score 7-10).   oxymetazoline 0.05 % nasal spray Commonly known as: AFRIN Place 1 spray into both nostrils 2 (two) times daily as needed for congestion.   predniSONE 1 MG tablet Commonly known as: DELTASONE Take 2 mg by mouth daily with breakfast.   pregabalin 75 MG capsule Commonly known as: LYRICA Take 75 mg by mouth See admin instructions. Take 75 mg by mouth in the morning and 150 mg in the afternoon   PROBIOTIC DAILY PO Take 1 capsule by mouth daily.   Toujeo SoloStar 300 UNIT/ML Sopn Generic drug: Insulin Glargine (1 Unit Dial) Inject 40 Units into the skin daily with  lunch. What changed: how much to take   Victoza 18 MG/3ML Soln injection Generic drug: Liraglutide Inject 1.8 mg into the skin every evening.   vitamin B-12 100 MCG tablet Commonly known as: CYANOCOBALAMIN Take 100 mcg by mouth daily.       Diagnostic Studies: Vas [place] Abi With/wo Tbi  Result Date: 11/05/2018 LOWER EXTREMITY DOPPLER STUDY Indications: Right BKA and ulceration to the right lateral distal thigh. Slowly              healing wound; wound vac placed. High Risk Factors: Diabetes, past history of smoking.  Vascular Interventions: Successful right SFA CTO [facility] 1 directional atherectomy                         followed by drug-coated balloon angioplasty in the                         setting of critical ischemia on 10/19/2018. S/p [facility] 1                         directional atherectomy and drug-eluting angioplasty of                         a total right superficial femoral artery with setting of                         critical limb ischemia with a BKA stump ischemic ulcer                         on 05/2017. Comparison Study: In 04/2018, an arterial Doppler showed an ABI of 1.11 on the                   left. Known right BKA. Performing Technologist: Tyna Jaksch RVT  Examination Guidelines: A complete evaluation includes at minimum, Doppler waveform signals and systolic blood pressure reading at the level of bilateral brachial, anterior tibial, and posterior tibial arteries, when vessel segments are accessible. Bilateral testing is considered an integral part of a complete examination. Photoelectric Plethysmograph (PPG) waveforms and toe systolic pressure readings are included as required and additional duplex testing as needed. Limited examinations for reoccurring indications may be performed as noted.  ABI Findings: +--------+------------------+-----+--------+--------+ Right   Rt Pressure (mmHg)IndexWaveformComment  +--------+------------------+-----+--------+--------+ Brachial98                                       +--------+------------------+-----+--------+--------+ +---------+------------------+-----+-----------+-------+ Left     Lt  Pressure (mmHg)IndexWaveform   Comment +---------+------------------+-----+-----------+-------+ Brachial 121                                       +---------+------------------+-----+-----------+-------+ ATA      129               1.07 multiphasic        +---------+------------------+-----+-----------+-------+ PTA      163               1.35 triphasic          +---------+------------------+-----+-----------+-------+ PERO     139               1.15 biphasic           +---------+------------------+-----+-----------+-------+ Great Toe95                0.79 Abnormal           +---------+------------------+-----+-----------+-------+ +-------+-----------+-----------+------------+------------+ ABI/TBIToday's ABIToday's TBIPrevious ABIPrevious TBI +-------+-----------+-----------+------------+------------+ Right  BKA        BKA        BKA         BKA          +-------+-----------+-----------+------------+------------+ Left   1.35       .79        1.11        .61          +-------+-----------+-----------+------------+------------+ Left ABIs and TBIs appear increased compared to prior study on 04/19/2018.  Summary: Right: Known right BKA. Left: Resting left ankle-brachial index indicates noncompressible left lower extremity arteries. The left toe-brachial index is normal. Technically challenging when obtaining the left toe pressure due to foot spasms.  *See table(s) above for measurements and observations.  Suggest follow up study in 6 months. Electronically signed by Lance MussJayadeep Varanasi MD on 11/05/2018 at 8:42:12 AM.    Final    Vas [place] Lower Extremity Arterial Duplex  Result Date: 11/05/2018 LOWER EXTREMITY ARTERIAL DUPLEX STUDY Indications: Right BKA and ulceration to the right lateral distal thigh. Slowly               healing wound; wound vac placed. High Risk Factors: Diabetes, past history of smoking.  Vascular Interventions: Successful right SFA [facility] directional atherectomy                         followed by drug-coated balloon angioplasty in the                         setting of critical ischemia on 10/19/2018. [facility]                         directional atherectomy and drug-eluting angioplasty of                         a total right superficial femoral artery with setting of                         critical limb ischemia with a BKA stump ischemic ulcer                         on 05/2017. Current ABI:  right BKA and 1.35 on the left. Comparison Study: In 10/2018, a right lower arterial duplex showed an occluded                   proximal and mid SFA with reconstitution of minimum monophasic                   flow in the distal SFA. Performing Technologist: Tyna Jaksch RVT  Examination Guidelines: A complete evaluation includes B-mode imaging, spectral Doppler, color Doppler, and power Doppler as needed of all accessible portions of each vessel. Bilateral testing is considered an integral part of a complete examination. Limited examinations for reoccurring indications may be performed as noted.  +----------+--------+-----+--------+----------------+--------+ RIGHT     PSV cm/sRatioStenosisWaveform        Comments +----------+--------+-----+--------+----------------+--------+ CFA Prox  193                  triphasic                +----------+--------+-----+--------+----------------+--------+ CFA Mid   173                  triphasic                +----------+--------+-----+--------+----------------+--------+ CFA Distal152                  triphasic                +----------+--------+-----+--------+----------------+--------+ DFA       151                  triphasic                +----------+--------+-----+--------+----------------+--------+ SFA Prox  142                   triphasic                +----------+--------+-----+--------+----------------+--------+ SFA Mid   51                   triphasic                +----------+--------+-----+--------+----------------+--------+ SFA Distal35                   barely triphasic         +----------+--------+-----+--------+----------------+--------+ POP Prox  0            occluded                         +----------+--------+-----+--------+----------------+--------+ POP Mid   19                   biphasic                 +----------+--------+-----+--------+----------------+--------+ POP Distal0            occluded                         +----------+--------+-----+--------+----------------+--------+ TP Trunk  0            occluded                         +----------+--------+-----+--------+----------------+--------+  Summary: Right: Total occlusion noted in the AK and BK popliteal artery. Total occlusion noted in the TPT. Marked improvement is noted compared to previous study.  See table(s) above for measurements and observations.  Suggest follow up study in 6 months. Electronically signed by Larae Grooms MD on 11/05/2018 at 8:44:22 AM.    Final     Patient benefited maximally from their hospital stay and there were no complications.     Disposition:  Discharge Instructions    Negative Pressure Wound Therapy - Incisional   Complete by: As directed    Attach wound VAC dressing to the portable Praveena wound VAC pump for discharge.  Show patient how to plug this in to keep it charged.     Follow-up Information    Newt Minion, MD In 1 week.   Specialty: Orthopedic Surgery Contact information: [place] [address] [phone]            Signed: Newt Minion 12/01/2018, 4:00 PM

## 2018-12-01 NOTE — Care Management Important Message (Signed)
Important Message  Patient Details  Name: Brian Zavala MRN: [id number] Date of Birth: Apr 17, 1950   Medicare Important Message Given:  Yes     [name] 12/01/2018, 3:05 PM

## 2018-12-01 NOTE — Plan of Care (Signed)
  Problem: Education: Goal: Knowledge of General Education information will improve Description: Including pain rating scale, medication(s)/side effects and non-pharmacologic comfort measures Outcome: Progressing   Problem: Clinical Measurements: Goal: Ability to maintain clinical measurements within normal limits will improve Outcome: Progressing   Problem: Activity: Goal: Risk for activity intolerance will decrease Outcome: Progressing   Problem: Coping: Goal: Level of anxiety will decrease Outcome: Progressing   Problem: Elimination: Goal: Will not experience complications related to bowel motility Outcome: Progressing   Problem: Pain Managment: Goal: General experience of comfort will improve Outcome: Progressing   Problem: Safety: Goal: Ability to remain free from injury will improve Outcome: Progressing   Problem: Skin Integrity: Goal: Risk for impaired skin integrity will decrease Outcome: Progressing   

## 2018-12-01 NOTE — Progress Notes (Addendum)
Patients blood sugar 60 @ [phone]  Patient states they don't feel any different, asymptomatic.    Patient received 4 oz of orange juice and 4 oz of apple juice.  Will recheck blood sugar in 15 mins.  0700 Blood Sugar 114

## 2018-12-02 ENCOUNTER — Telehealth: Payer: Self-pay

## 2018-12-02 NOTE — Telephone Encounter (Signed)
I called and sw Beth advised that he was d/c from the hospital and will have a wound vac on and has an appt on 12/07/18 will d/c vac at that time and advise of any HHN care needs. Will go out to see him for a mediation check and safety eval and plan to resume wound care after advised from office visit.

## 2018-12-02 NOTE — Telephone Encounter (Signed)
Beth with [facility] would like to know if patient needs to continue to have [facility] or if patient needs to be discharged.  Patient had RT Above Knee Amputation on 11/27/2018 with Dr. Sharol Given.  Cb# is [phone].  Please advise.  Thank you.

## 2018-12-07 ENCOUNTER — Encounter (HOSPITAL_COMMUNITY): Payer: Medicare (Managed Care)

## 2018-12-07 ENCOUNTER — Encounter: Payer: Self-pay | Admitting: Orthopedic Surgery

## 2018-12-07 ENCOUNTER — Ambulatory Visit (INDEPENDENT_AMBULATORY_CARE_PROVIDER_SITE_OTHER): Payer: Medicare (Managed Care) | Admitting: Orthopedic Surgery

## 2018-12-07 DIAGNOSIS — Z89611 Acquired absence of right leg above knee: Secondary | ICD-10-CM

## 2018-12-07 DIAGNOSIS — S78111A Complete traumatic amputation at level between right hip and knee, initial encounter: Secondary | ICD-10-CM

## 2018-12-07 MED ORDER — OXYCODONE HCL 10 MG PO TABS: 10.0000 mg | ORAL_TABLET | ORAL | 0 refills | Status: DC | PRN

## 2018-12-07 NOTE — Progress Notes (Signed)
Office Visit Note   Patient: Brian Zavala           Date of Birth: [birth year]           MRN: [id number] Visit Date: 12/07/2018              Requested by: [employer] [address] [place],  [place] [address] PCP: [facility]  Chief Complaint  Patient presents with  . Right Knee - Routine Post Op      HPI: Patient is a 68 year old gentleman who was seen 1 week status post right above-the-knee amputation the wound VAC is intact he is using oxycodone for pain medication.  Assessment & Plan: Visit Diagnoses: No diagnosis found.  Plan: Patient was given a prescription for biotech for a above-the-knee prosthesis.  Patient has used them in the past.  A refill prescription for his Percocet was called in.  Follow-up in 2 weeks at which time we will remove the sutures and staples.  Follow-Up Instructions: Return in about 2 weeks (around 12/21/2018).   Ortho Exam  Patient is alert, oriented, no adenopathy, well-dressed, normal affect, normal respiratory effort. Examination there is been no drainage in the wound VAC canister the wound edges are well approximated with no dehiscence no cellulitis no signs of infection.  Imaging: No results found. No images are attached to the encounter.  Labs: Lab Results  Component Value Date   HGBA1C 9.0 (H) 11/27/2018   HGBA1C 7.6 (H) 08/21/2018   HGBA1C 8.0 (H) 04/19/2018   ESRSEDRATE 46 (H) 04/18/2018   ESRSEDRATE 2 12/04/2017   CRP 1.4 (H) 04/21/2018   CRP 6.8 (H) 04/18/2018   CRP 3.1 12/04/2017   REPTSTATUS 09/18/2018 FINAL 09/09/2018   GRAMSTAIN  09/09/2018    ABUNDANT WBC PRESENT,BOTH PMN AND MONONUCLEAR FEW GRAM POSITIVE COCCI Performed at [facility], [address], [place], [place] [address]    CULT  09/09/2018    MODERATE STAPHYLOCOCCUS AUREUS FEW PEPTOSTREPTOCOCCUS SPECIES    LABORGA STAPHYLOCOCCUS AUREUS 09/09/2018     Lab Results  Component  Value Date   ALBUMIN 3.0 (L) 04/19/2018   ALBUMIN 3.3 (L) 04/18/2018   ALBUMIN 3.0 (L) 12/02/2014    Lab Results  Component Value Date   MG 1.8 12/03/2014   No results found for: VD25OH  No results found for: PREALBUMIN CBC EXTENDED Latest Ref Rng & Units 11/27/2018 10/20/2018 09/09/2018  WBC 4.0 - 10.5 K/uL 9.0 8.4 9.8  RBC 4.22 - 5.81 MIL/uL 4.16(L) 4.87 4.76  HGB 13.0 - 17.0 g/dL 11.4(L) 13.5 13.4  HCT 39.0 - 52.0 % 34.7(L) 41.7 40.9  PLT 150 - 400 K/uL 408(H) 300 349  NEUTROABS 1.7 - 7.7 K/uL - - -  LYMPHSABS 0.7 - 4.0 K/uL - - -     There is no height or weight on file to calculate BMI.  Orders:  No orders of the defined types were placed in this encounter.  Meds ordered this encounter  Medications  . Oxycodone HCl 10 MG TABS    Sig: Take 1-1.5 tablets (10-15 mg total) by mouth every 4 (four) hours as needed (pain score 7-10).    Dispense:  30 tablet    Refill:  0     Procedures: No procedures performed  Clinical Data: No additional findings.  ROS:  All other systems negative, except as noted in the HPI. Review of Systems  Objective: Vital Signs: There were  no vitals taken for this visit.  Specialty Comments:  No specialty comments available.  PMFS History: Patient Active Problem List   Diagnosis Date Noted  . Acquired absence of right leg above knee ([place]) 11/27/2018  . Hyperlipidemia 11/25/2018  . Carotid artery disease ([place]) 11/25/2018  . Critical limb ischemia with history of revascularization of same extremity 10/19/2018  . Below-knee amputation of right lower extremity ([place]) 09/09/2018  . Dehiscence of amputation stump ([place]) 08/18/2018  . H/O amputation of lesser toe, left ([place]) 05/06/2018  . Peripheral vascular disease ([place]) 04/19/2018  . Diabetic foot infection ([place]) 04/18/2018  . Tobacco abuse counseling   . Critical lower limb ischemia 05/02/2017  . Subacute osteomyelitis of right fibula ([place])   . Complete below knee amputation of lower  extremity, right, sequela ([place]) 02/03/2017  . Diabetes mellitus, insulin dependent (IDDM), controlled   . Type 2 diabetes mellitus with diabetic neuropathy, with long-term current use of insulin ([place])   . S/P unilateral BKA (below knee amputation) ([place])   . Rheumatoid arthritis ([place])   . Gallstones 09/23/2013  . Non-intractable cyclical vomiting with nausea 09/23/2013  . Epidermal inclusion cyst 11/30/2012   Past Medical History:  Diagnosis Date  . Arthritis    "pretty much all over"  . Cellulitis of left foot   . COPD (chronic obstructive pulmonary disease) ([place])   . Dyspnea   . Hyperlipidemia   . Hypertension   . Neuromuscular disorder (HCC)    neuropathy legs  . Neuropathy    legs  . Osteomyelitis ([place]) 04/2018   4th toe left foot  . Osteomyelitis of fourth toe of left foot ([place]) 04/19/2018  . Peripheral vascular disease ([place])   . Type II diabetes mellitus ([place])   . Wears glasses   . Wears partial dentures    top and bottom partials    Family History  Problem Relation Age of Onset  . Cirrhosis Father     Past Surgical History:  Procedure Laterality Date  . ABDOMINAL AORTOGRAM W/LOWER EXTREMITY Bilateral 10/19/2018   Procedure: ABDOMINAL AORTOGRAM W/LOWER EXTREMITY;  Surgeon: Lorretta Harp, MD;  Location: [place] [facility];  Service: Cardiovascular;  Laterality: Bilateral;  . ABOVE KNEE LEG AMPUTATION Right 11/27/2018  . AMPUTATION Left 04/22/2018   Procedure: LEFT FOOT 4TH RAY AMPUTATION;  Surgeon: Newt Minion, MD;  Location: [place];  Service: Orthopedics;  Laterality: Left;  . AMPUTATION Right 11/27/2018   Procedure: RIGHT ABOVE KNEE AMPUTATION;  Surgeon: Newt Minion, MD;  Location: [place];  Service: Orthopedics;  Laterality: Right;  . BACK SURGERY    . CHOLECYSTECTOMY N/A 10/26/2013   Procedure: LAPAROSCOPIC CHOLECYSTECTOMY WITH INTRAOPERATIVE CHOLANGIOGRAM;  Surgeon: Joyice Faster. Cornett, MD;  Location: [place];  Service: General;  Laterality: N/A;  . COLONOSCOPY     . CYST EXCISION Bilateral 02/15/2015   Procedure: LEFT INDEX FINGER AND RIGHT MIDDLE FINGER NODULE EXCISION;  Surgeon: Leandrew Koyanagi, MD;  Location: [place];  Service: Orthopedics;  Laterality: Bilateral;  . CYST EXCISION PERINEAL N/A 12/08/2012   Procedure: CYST EXCISION PERINeum;  Surgeon: Marcello Moores A. Cornett, MD;  Location: [place];  Service: General;  Laterality: N/A;  . FOOT AMPUTATION Right 2005  . I&D EXTREMITY Right 03/07/2017   Procedure: EXCISION FIBULAR HEAD RIGHT BELOW KNEE AMPUTATION;  Surgeon: Newt Minion, MD;  Location: [place];  Service: Orthopedics;  Laterality: Right;  . INCISE AND DRAIN ABCESS  12/02/2014   PERINEAL ABSCESS  .  INCISION AND DRAINAGE PERIRECTAL ABSCESS Left 12/02/2014   Procedure: IRRIGATION AND DEBRIDEMENT PERINEAL ABSCESS;  Surgeon: Abigail Miyamotoouglas Blackman, MD;  Location: [facility];  Service: General;  Laterality: Left;  . LEG AMPUTATION BELOW KNEE Right 2005  . LOWER EXTREMITY ANGIOGRAPHY N/A 05/05/2017   Procedure: LOWER EXTREMITY ANGIOGRAPHY;  Surgeon: Runell GessBerry, Jonathan J, MD;  Location: [facility];  Service: Cardiovascular;  Laterality: N/A;  . LUMBAR DISC SURGERY  82,90   ruptured disc  . PERIPHERAL VASCULAR INTERVENTION Right 05/05/2017   Procedure: PERIPHERAL VASCULAR INTERVENTION;  Surgeon: Runell GessBerry, Jonathan J, MD;  [facility];  Service: Cardiovascular;  Laterality: Right;  . PERIPHERAL VASCULAR INTERVENTION Right 10/19/2018   Procedure: PERIPHERAL VASCULAR INTERVENTION;  Surgeon: Runell GessBerry, Jonathan J, MD;  [facility];  Service: Cardiovascular;  Laterality: Right;  . SCAR REVISION Right 2005   @ amputation  . STUMP REVISION Right 08/21/2018   Procedure: REVISION RIGHT BELOW KNEE AMPUTATION, EXCISION FIBULA;  Surgeon: Nadara Mustarduda, Marcus V, MD;  Location: [facility];  Service: Orthopedics;  Laterality: Right;  . STUMP REVISION Right 09/09/2018   Procedure: REVISION RIGHT BELOW KNEE AMPUTATION;  Surgeon: Nadara Mustarduda,  Marcus V, MD;  [facility];  Service: Orthopedics;  Laterality: Right;  . TOE AMPUTATION Right 2005   Social History   Occupational History  . Not on file  Tobacco Use  . Smoking status: Former Smoker    Packs/day: 1.00    Years: 47.00    Pack years: 47.00    Types: Cigarettes    Quit date: 10/03/2018    Years since quitting: 0.1  . Smokeless tobacco: Never Used  Substance and Sexual Activity  . Alcohol use: No  . Drug use: No  . Sexual activity: Not Currently

## 2018-12-08 ENCOUNTER — Ambulatory Visit: Payer: Medicare (Managed Care) | Admitting: Internal Medicine

## 2018-12-14 ENCOUNTER — Other Ambulatory Visit: Payer: Medicare (Managed Care)

## 2018-12-14 ENCOUNTER — Ambulatory Visit (INDEPENDENT_AMBULATORY_CARE_PROVIDER_SITE_OTHER): Payer: Medicare (Managed Care) | Admitting: Orthopedic Surgery

## 2018-12-14 ENCOUNTER — Encounter: Payer: Self-pay | Admitting: Orthopedic Surgery

## 2018-12-14 VITALS — Ht 73.0 in | Wt 175.0 lb

## 2018-12-14 DIAGNOSIS — Z89611 Acquired absence of right leg above knee: Secondary | ICD-10-CM

## 2018-12-14 DIAGNOSIS — S78111A Complete traumatic amputation at level between right hip and knee, initial encounter: Secondary | ICD-10-CM

## 2018-12-14 NOTE — Progress Notes (Signed)
Office Visit Note   Patient: Brian Zavala           Date of Birth: 08-08-1950           MRN: [id number] Visit Date: 12/14/2018              Requested by: [employer], [place],  [place] [address] PCP: Inc, [place]  Chief Complaint  Patient presents with  . Right Leg - Routine Post Op    11/27/2018 right AKA      HPI: Patient is a 68 year old gentleman who is two-week status post right above-knee amputation.  Patient complains of pain from several of the staples.  Assessment & Plan: Visit Diagnoses:  1. Unilateral AKA, right (HCC)     Plan: Sutures and staples harvested patient is given a prescription to follow-up with biotech for right above-the-knee prosthesis.  Patient will start with the stump shrinker.  Follow-Up Instructions: Return in about 2 weeks (around 12/28/2018).   Ortho Exam  Patient is alert, oriented, no adenopathy, well-dressed, normal affect, normal respiratory effort. Examination patient's surgical incision is well-healed there is no redness no cellulitis no drainage there is good epithelialization no signs of infection no wound dehiscence.  Imaging: No results found. No images are attached to the encounter.  Labs: Lab Results  Component Value Date   HGBA1C 9.0 (H) 11/27/2018   HGBA1C 7.6 (H) 08/21/2018   HGBA1C 8.0 (H) 04/19/2018   ESRSEDRATE 46 (H) 04/18/2018   ESRSEDRATE 2 12/04/2017   CRP 1.4 (H) 04/21/2018   CRP 6.8 (H) 04/18/2018   CRP 3.1 12/04/2017   REPTSTATUS 09/18/2018 FINAL 09/09/2018   GRAMSTAIN  09/09/2018    ABUNDANT WBC PRESENT,BOTH PMN AND MONONUCLEAR FEW GRAM POSITIVE COCCI Performed at [facility], [place] [address] [place], [place] [address]    CULT  09/09/2018    MODERATE STAPHYLOCOCCUS AUREUS FEW PEPTOSTREPTOCOCCUS SPECIES    LABORGA STAPHYLOCOCCUS AUREUS 09/09/2018     Lab Results  Component Value Date   ALBUMIN 3.0 (L)  04/19/2018   ALBUMIN 3.3 (L) 04/18/2018   ALBUMIN 3.0 (L) 12/02/2014    Lab Results  Component Value Date   MG 1.8 12/03/2014   No results found for: VD25OH  No results found for: PREALBUMIN CBC EXTENDED Latest Ref Rng & Units 11/27/2018 10/20/2018 09/09/2018  WBC 4.0 - 10.5 K/uL 9.0 8.4 9.8  RBC 4.22 - 5.81 MIL/uL 4.16(L) 4.87 4.76  HGB 13.0 - 17.0 g/dL 11.4(L) 13.5 13.4  HCT 39.0 - 52.0 % 34.7(L) 41.7 40.9  PLT 150 - 400 K/uL 408(H) 300 349  NEUTROABS 1.7 - 7.7 K/uL - - -  LYMPHSABS 0.7 - 4.0 K/uL - - -     Body mass index is 23.09 kg/m.  Orders:  No orders of the defined types were placed in this encounter.  No orders of the defined types were placed in this encounter.    Procedures: No procedures performed  Clinical Data: No additional findings.  ROS:  All other systems negative, except as noted in the HPI. Review of Systems  Objective: Vital Signs: Ht 6\' 1"  (1.854 m)   Wt 175 lb (79.4 kg)   BMI 23.09 kg/m   Specialty Comments:  No specialty comments available.  PMFS History: Patient Active Problem List   Diagnosis Date Noted  . Acquired absence of right leg above knee ([place]) 11/27/2018  . Hyperlipidemia 11/25/2018  . Carotid  artery disease (HCC) 11/25/2018  . Critical limb ischemia with history of revascularization of same extremity 10/19/2018  . Below-knee amputation of right lower extremity (HCC) 09/09/2018  . Dehiscence of amputation stump (HCC) 08/18/2018  . H/O amputation of lesser toe, left (HCC) 05/06/2018  . Peripheral vascular disease (HCC) 04/19/2018  . Diabetic foot infection (HCC) 04/18/2018  . Tobacco abuse counseling   . Critical lower limb ischemia 05/02/2017  . Subacute osteomyelitis of right fibula (HCC)   . 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 Diabetes mellitus, insulin dependent (IDDM), controlled   . Type 2 diabetes mellitus with diabetic neuropathy, with long-term current use of  insulin (HCC)   . S/P unilateral BKA (below knee amputation) (HCC)   . Rheumatoid arthritis (HCC)   . Gallstones 09/23/2013  . Non-intractable cyclical vomiting with nausea 09/23/2013  . Epidermal inclusion cyst 11/30/2012   Past Medical History:  Diagnosis Date  . Arthritis    "pretty much all over"  . Cellulitis of left foot   . COPD (chronic obstructive pulmonary disease) (HCC)   . Dyspnea   . Hyperlipidemia   . Hypertension   . Neuromuscular disorder (HCC)    neuropathy legs  . Neuropathy    legs  . Osteomyelitis (HCC) 04/2018   4th toe left foot  . Osteomyelitis of fourth toe of left foot (HCC) 04/19/2018  . Peripheral vascular disease (HCC)   . Type II diabetes mellitus (HCC)   . Wears glasses   . Wears partial dentures    top and bottom partials    Family History  Problem Relation Age of Onset  . Cirrhosis Father     Past Surgical History:  Procedure Laterality Date  . ABDOMINAL AORTOGRAM W/LOWER EXTREMITY Bilateral 10/19/2018   Procedure: ABDOMINAL AORTOGRAM W/LOWER EXTREMITY;  Surgeon: [name], MD;  Location[facility] INVASIVE CV LAB;  Service: Cardiovascular;  Laterality: Bilateral;  . ABOVE KNEE LEG AMPUTATION Right 11/27/2018  . AMPUTATION Left 04/22/2018   Procedure: LEFT FOOT 4TH RAY AMPUTATION;  Surgeon: [name], MD;  Location: [facility] OR;  Service: Orthopedics;  Laterality: Left;  . AMPUTATION Right 11/27/2018   Procedure: RIGHT ABOVE KNEE AMPUTATION;  Surgeon: [name], MD;  Location: [facility] OR;  Service: Orthopedics;  Laterality: Right;  . BACK SURGERY    . CHOLECYSTECTOMY N/A 10/26/2013   Procedure: LAPAROSCOPIC CHOLECYSTECTOMY WITH INTRAOPERATIVE CHOLANGIOGRAM;  Surgeon: Clovis Pu. Cornett, MD;  [facility]: [facility] OR;  Service: General;  Laterality: N/A;  . COLONOSCOPY    . CYST EXCISION Bilateral 02/15/2015   Procedure: LEFT INDEX FINGER AND RIGHT MIDDLE FINGER NODULE EXCISION;  Surgeon: Tarry Kos, MD;  Location: [facility];  Service:  Orthopedics;  Laterality: Bilateral;  . CYST EXCISION PERINEAL N/A 12/08/2012   Procedure: CYST EXCISION PERINeum;  Surgeon: Maisie Fus A. Cornett, MD;  Location: [facility];  Service: General;  Laterality: N/A;  . FOOT AMPUTATION Right 2005  . I&D EXTREMITY Right 03/07/2017   Procedure: EXCISION FIBULAR HEAD RIGHT BELOW KNEE AMPUTATION;  Surgeon: [name], MD;  Location: [facility] OR;  Service: Orthopedics;  Laterality: Right;  . INCISE AND DRAIN ABCESS  12/02/2014   PERINEAL ABSCESS  . INCISION AND DRAINAGE PERIRECTAL ABSCESS Left 12/02/2014   Procedure: IRRIGATION AND DEBRIDEMENT PERINEAL ABSCESS;  Surgeon: Abigail Miyamoto, MD;  [facility] OR;  Service: General;  Laterality: Left;  . LEG AMPUTATION BELOW KNEE Right 2005  . LOWER EXTREMITY ANGIOGRAPHY N/A  05/05/2017   Procedure: LOWER EXTREMITY ANGIOGRAPHY;  Surgeon: [name], MD;  Location: [facility] INVASIVE CV LAB;  Service: Cardiovascular;  Laterality: N/A;  . LUMBAR DISC SURGERY  82,90   ruptured disc  . PERIPHERAL VASCULAR INTERVENTION Right 05/05/2017   Procedure: PERIPHERAL VASCULAR INTERVENTION;  Surgeon: [name], MD;  Location: [facility] INVASIVE CV LAB;  Service: Cardiovascular;  Laterality: Right;  . PERIPHERAL VASCULAR INTERVENTION Right 10/19/2018   Procedure: PERIPHERAL VASCULAR INTERVENTION;  Surgeon: [name], MD;  Location: [facility] INVASIVE CV LAB;  Service: Cardiovascular;  Laterality: Right;  . SCAR REVISION Right 2005   @ amputation  . STUMP REVISION Right 08/21/2018   Procedure: REVISION RIGHT BELOW KNEE AMPUTATION, EXCISION FIBULA;  Surgeon: [name], MD;  [facility] OR;  Service: Orthopedics;  Laterality: Right;  . STUMP REVISION Right 09/09/2018   Procedure: REVISION RIGHT BELOW KNEE AMPUTATION;  Surgeon: [name], MD;  Location: [facility] OR;  Service: Orthopedics;  Laterality: Right;  . TOE AMPUTATION Right 2005   Social History   Occupational History  . Not on file  Tobacco Use  . Smoking  status: Former Smoker    Packs/day: 1.00    Years: 47.00    Pack years: 47.00    Types: Cigarettes    Quit date: 10/03/2018    Years since quitting: 0.1  . Smokeless tobacco: Never Used  Substance and Sexual Activity  . Alcohol use: No  . Drug use: No  . Sexual activity: Not Currently

## 2018-12-21 ENCOUNTER — Ambulatory Visit: Payer: Medicare (Managed Care) | Admitting: Orthopedic Surgery

## 2018-12-31 ENCOUNTER — Other Ambulatory Visit: Payer: Self-pay

## 2018-12-31 ENCOUNTER — Ambulatory Visit (INDEPENDENT_AMBULATORY_CARE_PROVIDER_SITE_OTHER): Payer: Medicare (Managed Care) | Admitting: Orthopedic Surgery

## 2018-12-31 ENCOUNTER — Encounter: Payer: Self-pay | Admitting: Orthopedic Surgery

## 2018-12-31 VITALS — Ht 73.0 in | Wt 175.0 lb

## 2018-12-31 DIAGNOSIS — S78111A Complete traumatic amputation at level between right hip and knee, initial encounter: Secondary | ICD-10-CM

## 2018-12-31 NOTE — Progress Notes (Signed)
Office Visit Note   Patient: Brian Zavala           Date of Birth: March 31, 1950           MRN: [id number] Visit Date: 12/31/2018              Requested by: [employer], [place] [facility] [place] [address] [place],  [place] [address] PCP: Inc, [place] [facility]  Chief Complaint  Patient presents with  . Right Leg - Routine Post Op    11/27/18 right AKA      HPI: Patient is a 68 year old gentleman who is 1 month status post right above-the-knee amputation he is working with biotech for prosthetic fitting.   Assessment & Plan: Visit Diagnoses: No diagnosis found.  Plan: Patient will continue with daily cleansing he will start using his stump shrinker.  Follow-Up Instructions: No follow-ups on file.   Ortho Exam  Patient is alert, oriented, no adenopathy, well-dressed, normal affect, normal respiratory effort. Examination the residual limb is healing nicely there is no redness no cellulitis no drainage no signs of infection.  Imaging: No results found.   Labs: Lab Results  Component Value Date   HGBA1C 9.0 (H) 11/27/2018   HGBA1C 7.6 (H) 08/21/2018   HGBA1C 8.0 (H) 04/19/2018   ESRSEDRATE 46 (H) 04/18/2018   ESRSEDRATE 2 12/04/2017   CRP 1.4 (H) 04/21/2018   CRP 6.8 (H) 04/18/2018   CRP 3.1 12/04/2017   REPTSTATUS 09/18/2018 FINAL 09/09/2018   GRAMSTAIN  09/09/2018    ABUNDANT WBC PRESENT,BOTH PMN AND MONONUCLEAR FEW GRAM POSITIVE COCCI Performed at [facility], [address], [place], [place] [address]    CULT  09/09/2018    MODERATE STAPHYLOCOCCUS AUREUS FEW PEPTOSTREPTOCOCCUS SPECIES    LABORGA STAPHYLOCOCCUS AUREUS 09/09/2018     Lab Results  Component Value Date   ALBUMIN 3.0 (L) 04/19/2018   ALBUMIN 3.3 (L) 04/18/2018   ALBUMIN 3.0 (L) 12/02/2014    Lab Results  Component Value Date   MG 1.8 12/03/2014   No results found for: VD25OH  No results found for: PREALBUMIN CBC EXTENDED Latest Ref Rng &  Units 11/27/2018 10/20/2018 09/09/2018  WBC 4.0 - 10.5 K/uL 9.0 8.4 9.8  RBC 4.22 - 5.81 MIL/uL 4.16(L) 4.87 4.76  HGB 13.0 - 17.0 g/dL 11.4(L) 13.5 13.4  HCT 39.0 - 52.0 % 34.7(L) 41.7 40.9  PLT 150 - 400 K/uL 408(H) 300 349  NEUTROABS 1.7 - 7.7 K/uL - - -  LYMPHSABS 0.7 - 4.0 K/uL - - -     Body mass index is 23.09 kg/m.  Orders:  No orders of the defined types were placed in this encounter.  No orders of the defined types were placed in this encounter.    Procedures: No procedures performed  Clinical Data: No additional findings.  ROS:  All other systems negative, except as noted in the HPI. Review of Systems  Objective: Vital Signs: Ht 6\' 1"  (1.854 m)   Wt 175 lb (79.4 kg)   BMI 23.09 kg/m   Specialty Comments:  No specialty comments available.  PMFS History: Patient Active Problem List   Diagnosis Date Noted  . 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 Hyperlipidemia 11/25/2018  . Carotid artery disease (HCC) 11/25/2018  . Critical limb ischemia with history of revascularization of same extremity 10/19/2018  . Below-knee amputation of right lower extremity (HCC) 09/09/2018  . Dehiscence of amputation stump (  HCC) 08/18/2018  . H/O amputation of lesser toe, left (HCC) 05/06/2018  . Peripheral vascular disease (HCC) 04/19/2018  . Diabetic foot infection (HCC) 04/18/2018  . Tobacco abuse counseling   . Critical lower limb ischemia 05/02/2017  . Subacute osteomyelitis of right fibula (HCC)   . 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 Diabetes mellitus, insulin dependent (IDDM), controlled   . Type 2 diabetes mellitus with diabetic neuropathy, with long-term current use of insulin (HCC)   . S/P unilateral BKA (below knee amputation) (HCC)   . Rheumatoid arthritis (HCC)   . Gallstones 09/23/2013  . Non-intractable cyclical vomiting with nausea 09/23/2013  . Epidermal inclusion cyst 11/30/2012    Past Medical History:  Diagnosis Date  . Arthritis    "pretty much all over"  . Cellulitis of left foot   . COPD (chronic obstructive pulmonary disease) (HCC)   . Dyspnea   . Hyperlipidemia   . Hypertension   . Neuromuscular disorder (HCC)    neuropathy legs  . Neuropathy    legs  . Osteomyelitis (HCC) 04/2018   4th toe left foot  . Osteomyelitis of fourth toe of left foot (HCC) 04/19/2018  . Peripheral vascular disease (HCC)   . Type II diabetes mellitus (HCC)   . Wears glasses   . Wears partial dentures    top and bottom partials    Family History  Problem Relation Age of Onset  . Cirrhosis Father     Past Surgical History:  Procedure Laterality Date  . ABDOMINAL AORTOGRAM W/LOWER EXTREMITY Bilateral 10/19/2018   Procedure: ABDOMINAL AORTOGRAM W/LOWER EXTREMITY;  Surgeon: [name], MD;  Location: [facility] INVASIVE CV LAB;  Service: Cardiovascular;  Laterality: Bilateral;  . ABOVE KNEE LEG AMPUTATION Right 11/27/2018  . AMPUTATION Left 04/22/2018   Procedure: LEFT FOOT 4TH RAY AMPUTATION;  Surgeon: [name], MD;  Location: [facility] OR;  Service: Orthopedics;  Laterality: Left;  . AMPUTATION Right 11/27/2018   Procedure: RIGHT ABOVE KNEE AMPUTATION;  Surgeon: [name], MD;  Location: [facility] OR;  Service: Orthopedics;  Laterality: Right;  . BACK SURGERY    . CHOLECYSTECTOMY N/A 10/26/2013   Procedure: LAPAROSCOPIC CHOLECYSTECTOMY WITH INTRAOPERATIVE CHOLANGIOGRAM;  Surgeon: Clovis Pu. Cornett, MD;  Location: [facility] OR;  Service: General;  Laterality: N/A;  . COLONOSCOPY    . CYST EXCISION Bilateral 02/15/2015   Procedure: LEFT INDEX FINGER AND RIGHT MIDDLE FINGER NODULE EXCISION;  Surgeon: Tarry Kos, MD;  Location: [facility];  Service: Orthopedics;  Laterality: Bilateral;  . CYST EXCISION PERINEAL N/A 12/08/2012   Procedure: CYST EXCISION PERINeum;  Surgeon: Maisie Fus A. Cornett, MD;  Location: [facility];  Service: General;  Laterality: N/A;  .  FOOT AMPUTATION Right 2005  . I&D EXTREMITY Right 03/07/2017   Procedure: EXCISION FIBULAR HEAD RIGHT BELOW KNEE AMPUTATION;  Surgeon: [name], MD;  Location: [facility] OR;  Service: Orthopedics;  Laterality: Right;  . INCISE AND DRAIN ABCESS  12/02/2014   PERINEAL ABSCESS  . INCISION AND DRAINAGE PERIRECTAL ABSCESS Left 12/02/2014   Procedure: IRRIGATION AND DEBRIDEMENT PERINEAL ABSCESS;  Surgeon: Abigail Miyamoto, MD;  Location: [facility] OR;  Service: General;  Laterality: Left;  . LEG AMPUTATION BELOW KNEE Right 2005  . LOWER EXTREMITY ANGIOGRAPHY N/A 05/05/2017   Procedure: LOWER EXTREMITY ANGIOGRAPHY;  Surgeon: [name], MD;  [facility] INVASIVE CV LAB;  Service: Cardiovascular;  Laterality: N/A;  . LUMBAR DISC SURGERY  82,90  ruptured disc  . PERIPHERAL VASCULAR INTERVENTION Right 05/05/2017   Procedure: PERIPHERAL VASCULAR INTERVENTION;  Surgeon: Lorretta Harp, MD;  Location: [place] CV LAB;  Service: Cardiovascular;  Laterality: Right;  . PERIPHERAL VASCULAR INTERVENTION Right 10/19/2018   Procedure: PERIPHERAL VASCULAR INTERVENTION;  Surgeon: Lorretta Harp, MD;  Location: [place] CV LAB;  Service: Cardiovascular;  Laterality: Right;  . SCAR REVISION Right 2005   @ amputation  . STUMP REVISION Right 08/21/2018   Procedure: REVISION RIGHT BELOW KNEE AMPUTATION, EXCISION FIBULA;  Surgeon: Newt Minion, MD;  Location: [place];  Service: Orthopedics;  Laterality: Right;  . STUMP REVISION Right 09/09/2018   Procedure: REVISION RIGHT BELOW KNEE AMPUTATION;  Surgeon: Newt Minion, MD;  Location: [place];  Service: Orthopedics;  Laterality: Right;  . TOE AMPUTATION Right 2005   Social History   Occupational History  . Not on file  Tobacco Use  . Smoking status: Former Smoker    Packs/day: 1.00    Years: 47.00    Pack years: 47.00    Types: Cigarettes    Quit date: 10/03/2018    Years since quitting: 0.2  . Smokeless tobacco: Never Used  Substance and Sexual Activity  .  Alcohol use: No  . Drug use: No  . Sexual activity: Not Currently

## 2019-01-01 ENCOUNTER — Encounter: Payer: Self-pay | Admitting: Orthopedic Surgery

## 2019-02-01 ENCOUNTER — Ambulatory Visit (INDEPENDENT_AMBULATORY_CARE_PROVIDER_SITE_OTHER): Payer: Medicare (Managed Care) | Admitting: Orthopedic Surgery

## 2019-02-01 ENCOUNTER — Other Ambulatory Visit: Payer: Self-pay

## 2019-02-01 ENCOUNTER — Encounter: Payer: Self-pay | Admitting: Orthopedic Surgery

## 2019-02-01 VITALS — Ht 73.0 in | Wt 175.0 lb

## 2019-02-01 DIAGNOSIS — M86261 Subacute osteomyelitis, right tibia and fibula: Secondary | ICD-10-CM

## 2019-02-01 NOTE — Progress Notes (Signed)
Office Visit Note   Patient: Brian Zavala           Date of Birth: August 12, 1950           MRN: [id number] Visit Date: 02/01/2019              Requested by: [employer], [place] [facility] [facility] [place] [address] [place],  [place] [address] PCP: Inc, [place] [facility]  Chief Complaint  Patient presents with  . Right Leg - Routine Post Op    11/27/18 right AKA      HPI: This is a pleasant gentleman who is now 2 months status post right above-knee amputation he is doing well and is going to be casted for his prosthetic  Assessment & Plan: Visit Diagnoses: No diagnosis found.  Plan: He will undergo casting for his prosthetic he will follow up in 4 weeks after he has his prosthetic  Follow-Up Instructions: No follow-ups on file.   Ortho Exam  Patient is alert, oriented, no adenopathy, well-dressed, normal affect, normal respiratory effort. Right above-knee amputation: Well-healed surgical incision swelling is well controlled no cellulitis  Imaging: No results found.   Labs: Lab Results  Component Value Date   HGBA1C 9.0 (H) 11/27/2018   HGBA1C 7.6 (H) 08/21/2018   HGBA1C 8.0 (H) 04/19/2018   ESRSEDRATE 46 (H) 04/18/2018   ESRSEDRATE 2 12/04/2017   CRP 1.4 (H) 04/21/2018   CRP 6.8 (H) 04/18/2018   CRP 3.1 12/04/2017   REPTSTATUS 09/18/2018 FINAL 09/09/2018   GRAMSTAIN  09/09/2018    ABUNDANT WBC PRESENT,BOTH PMN AND MONONUCLEAR FEW GRAM POSITIVE COCCI Performed at [facility], [address] [place], [place] [address]    CULT  09/09/2018    MODERATE STAPHYLOCOCCUS AUREUS FEW PEPTOSTREPTOCOCCUS SPECIES    LABORGA STAPHYLOCOCCUS AUREUS 09/09/2018     Lab Results  Component Value Date   ALBUMIN 3.0 (L) 04/19/2018   ALBUMIN 3.3 (L) 04/18/2018   ALBUMIN 3.0 (L) 12/02/2014    Lab Results  Component Value Date   MG 1.8 12/03/2014   No results found for: VD25OH  No results found for: PREALBUMIN CBC EXTENDED Latest Ref  Rng & Units 11/27/2018 10/20/2018 09/09/2018  WBC 4.0 - 10.5 K/uL 9.0 8.4 9.8  RBC 4.22 - 5.81 MIL/uL 4.16(L) 4.87 4.76  HGB 13.0 - 17.0 g/dL 11.4(L) 13.5 13.4  HCT 39.0 - 52.0 % 34.7(L) 41.7 40.9  PLT 150 - 400 K/uL 408(H) 300 349  NEUTROABS 1.7 - 7.7 K/uL - - -  LYMPHSABS 0.7 - 4.0 K/uL - - -     Body mass index is 23.09 kg/m.  Orders:  No orders of the defined types were placed in this encounter.  No orders of the defined types were placed in this encounter.    Procedures: No procedures performed  Clinical Data: No additional findings.  ROS:  All other systems negative, except as noted in the HPI. Review of Systems  Objective: Vital Signs: Ht 6\' 1"  (1.854 m)   Wt 175 lb (79.4 kg)   BMI 23.09 kg/m   Specialty Comments:  No specialty comments available.  PMFS History: Patient Active Problem List   Diagnosis Date Noted  . 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 Hyperlipidemia 11/25/2018  . Carotid artery disease (HCC) 11/25/2018  . Critical limb ischemia with history of revascularization of same extremity 10/19/2018  . Below-knee amputation of right lower extremity (HCC) 09/09/2018  .  Dehiscence of amputation stump ([place]) 08/18/2018  . H/O amputation of lesser toe, left ([place]) 05/06/2018  . Peripheral vascular disease ([place]) 04/19/2018  . Diabetic foot infection ([place]) 04/18/2018  . Tobacco abuse counseling   . Critical lower limb ischemia 05/02/2017  . Subacute osteomyelitis of right fibula ([place])   . 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 Diabetes mellitus, insulin dependent (IDDM), controlled   . Type 2 diabetes mellitus with diabetic neuropathy, with long-term current use of insulin ([place])   . S/P unilateral BKA (below knee amputation) ([place])   . Rheumatoid arthritis ([place])   . Gallstones 09/23/2013  . Non-intractable cyclical vomiting with nausea 09/23/2013  . Epidermal inclusion cyst 11/30/2012    Past Medical History:  Diagnosis Date  . Arthritis    "pretty much all over"  . Cellulitis of left foot   . COPD (chronic obstructive pulmonary disease) ([place])   . Dyspnea   . Hyperlipidemia   . Hypertension   . Neuromuscular disorder (HCC)    neuropathy legs  . Neuropathy    legs  . Osteomyelitis ([place]) 04/2018   4th toe left foot  . Osteomyelitis of fourth toe of left foot ([place]) 04/19/2018  . Peripheral vascular disease ([place])   . Type II diabetes mellitus ([place])   . Wears glasses   . Wears partial dentures    top and bottom partials    Family History  Problem Relation Age of Onset  . Cirrhosis Father     Past Surgical History:  Procedure Laterality Date  . ABDOMINAL AORTOGRAM W/LOWER EXTREMITY Bilateral 10/19/2018   Procedure: ABDOMINAL AORTOGRAM W/LOWER EXTREMITY;  Surgeon: Lorretta Harp, MD;  Location: [place] CV LAB;  Service: Cardiovascular;  Laterality: Bilateral;  . ABOVE KNEE LEG AMPUTATION Right 11/27/2018  . AMPUTATION Left 04/22/2018   Procedure: LEFT FOOT 4TH RAY AMPUTATION;  Surgeon: Newt Minion, MD;  Location: [place];  Service: Orthopedics;  Laterality: Left;  . AMPUTATION Right 11/27/2018   Procedure: RIGHT ABOVE KNEE AMPUTATION;  Surgeon: Newt Minion, MD;  Location: [place];  Service: Orthopedics;  Laterality: Right;  . BACK SURGERY    . CHOLECYSTECTOMY N/A 10/26/2013   Procedure: LAPAROSCOPIC CHOLECYSTECTOMY WITH INTRAOPERATIVE CHOLANGIOGRAM;  Surgeon: Joyice Faster. Cornett, MD;  Location: [place];  Service: General;  Laterality: N/A;  . COLONOSCOPY    . CYST EXCISION Bilateral 02/15/2015   Procedure: LEFT INDEX FINGER AND RIGHT MIDDLE FINGER NODULE EXCISION;  Surgeon: Leandrew Koyanagi, MD;  Location: [place];  Service: Orthopedics;  Laterality: Bilateral;  . CYST EXCISION PERINEAL N/A 12/08/2012   Procedure: CYST EXCISION PERINeum;  Surgeon: Marcello Moores A. Cornett, MD;  Location: [place];  Service: General;  Laterality: N/A;  .  FOOT AMPUTATION Right 2005  . I&D EXTREMITY Right 03/07/2017   Procedure: EXCISION FIBULAR HEAD RIGHT BELOW KNEE AMPUTATION;  Surgeon: Newt Minion, MD;  Location: [place];  Service: Orthopedics;  Laterality: Right;  . INCISE AND DRAIN ABCESS  12/02/2014   PERINEAL ABSCESS  . INCISION AND DRAINAGE PERIRECTAL ABSCESS Left 12/02/2014   Procedure: IRRIGATION AND DEBRIDEMENT PERINEAL ABSCESS;  Surgeon: Coralie Keens, MD;  Location: [place];  Service: General;  Laterality: Left;  . LEG AMPUTATION BELOW KNEE Right 2005  . LOWER EXTREMITY ANGIOGRAPHY N/A 05/05/2017   Procedure: LOWER EXTREMITY ANGIOGRAPHY;  Surgeon: Lorretta Harp, MD;  Location: [place] CV LAB;  Service: Cardiovascular;  Laterality: N/A;  . [place]  SURGERY  82,90   ruptured disc  . PERIPHERAL VASCULAR INTERVENTION Right 05/05/2017   Procedure: PERIPHERAL VASCULAR INTERVENTION;  Surgeon: Runell GessBerry, Jonathan J, MD;  [facility] LAB;  Service: Cardiovascular;  Laterality: Right;  . PERIPHERAL VASCULAR INTERVENTION Right 10/19/2018   Procedure: PERIPHERAL VASCULAR INTERVENTION;  Surgeon: Runell GessBerry, Jonathan J, MD;  [facility];  Service: Cardiovascular;  Laterality: Right;  . SCAR REVISION Right 2005   @ amputation  . STUMP REVISION Right 08/21/2018   Procedure: REVISION RIGHT BELOW KNEE AMPUTATION, EXCISION FIBULA;  Surgeon: Nadara Mustarduda, Marcus V, MD;  [facility];  Service: Orthopedics;  Laterality: Right;  . STUMP REVISION Right 09/09/2018   Procedure: REVISION RIGHT BELOW KNEE AMPUTATION;  Surgeon: Nadara Mustarduda, Marcus V, MD;  [facility];  Service: Orthopedics;  Laterality: Right;  . TOE AMPUTATION Right 2005   Social History   Occupational History  . Not on file  Tobacco Use  . Smoking status: Former Smoker    Packs/day: 1.00    Years: 47.00    Pack years: 47.00    Types: Cigarettes    Quit date: 10/03/2018    Years since quitting: 0.3  . Smokeless tobacco: Never Used  Substance and Sexual Activity  .  Alcohol use: No  . Drug use: No  . Sexual activity: Not Currently

## 2019-02-03 ENCOUNTER — Ambulatory Visit: Payer: Medicare (Managed Care) | Admitting: Cardiovascular Disease

## 2019-02-05 ENCOUNTER — Ambulatory Visit: Payer: Medicare (Managed Care) | Admitting: Cardiovascular Disease

## 2019-03-15 ENCOUNTER — Other Ambulatory Visit: Payer: Self-pay

## 2019-03-15 ENCOUNTER — Ambulatory Visit (INDEPENDENT_AMBULATORY_CARE_PROVIDER_SITE_OTHER): Payer: Medicare (Managed Care) | Admitting: Orthopedic Surgery

## 2019-03-15 ENCOUNTER — Encounter: Payer: Self-pay | Admitting: Orthopedic Surgery

## 2019-03-15 VITALS — Ht 73.0 in | Wt 175.0 lb

## 2019-03-15 DIAGNOSIS — M79604 Pain in right leg: Secondary | ICD-10-CM

## 2019-03-15 DIAGNOSIS — Z89611 Acquired absence of right leg above knee: Secondary | ICD-10-CM

## 2019-03-15 DIAGNOSIS — S78111A Complete traumatic amputation at level between right hip and knee, initial encounter: Secondary | ICD-10-CM

## 2019-03-15 NOTE — Progress Notes (Signed)
Office Visit Note   Patient: Brian Zavala           Date of Birth: [birth year]           MRN: [id number] Visit Date: 03/15/2019              Requested by: [employer], [facility] [address] [place],  [place] [address] PCP: Inc, [place] [facility]  Chief Complaint  Patient presents with  . Right Leg - Routine Post Op    11/27/18 right AKA      HPI: Patient is a 69 year old gentleman status post right above-the-knee amputation approximately 4 months out from surgery.  Patient complains of pain that shoots up from the lateral aspect of the femur when he tries to weight-bear on his prosthesis.  Patient states he had pressure unloading pads placed in the socket by [employer] he states that this was uncomfortable and he had these removed.  Patient feels like he needs some type of nerve block.  Patient is currently on Lyrica.  Assessment & Plan: Visit Diagnoses:  1. Unilateral AKA, right (HCC)   2. Pain in right leg     Plan: Patient's pain is from an bearing on the distal femur.  There is no neuroma symptoms.  Recommended increasing the time he wears the prosthesis during the day and recommended following up with [employer] to have the pads placed to unload pressure from the residual limb.  Follow-Up Instructions: Return in about 4 weeks (around 04/12/2019).   Ortho Exam  Patient is alert, oriented, no adenopathy, well-dressed, normal affect, normal respiratory effort. Examination there is no redness no cellulitis no ulcers no callus no signs of infection.  Patient has tenderness to palpation over the lateral aspect of the distal femur.  There is no neuroma pain over the area of the sciatic nerve.  There is no swelling no cellulitis.  Imaging: No results found. No images are attached to the encounter.  Labs: Lab Results  Component Value Date   HGBA1C 9.0 (H) 11/27/2018   HGBA1C 7.6 (H) 08/21/2018   HGBA1C 8.0 (H) 04/19/2018   ESRSEDRATE 46 (H) 04/18/2018   ESRSEDRATE 2 12/04/2017   CRP 1.4 (H) 04/21/2018   CRP 6.8 (H) 04/18/2018   CRP 3.1 12/04/2017   REPTSTATUS 09/18/2018 FINAL 09/09/2018   GRAMSTAIN  09/09/2018    ABUNDANT WBC PRESENT,BOTH PMN AND MONONUCLEAR FEW GRAM POSITIVE COCCI Performed at [facility], [address] [place], [place] [address]    CULT  09/09/2018    MODERATE STAPHYLOCOCCUS AUREUS FEW PEPTOSTREPTOCOCCUS SPECIES    LABORGA STAPHYLOCOCCUS AUREUS 09/09/2018     Lab Results  Component Value Date   ALBUMIN 3.0 (L) 04/19/2018   ALBUMIN 3.3 (L) 04/18/2018   ALBUMIN 3.0 (L) 12/02/2014    Lab Results  Component Value Date   MG 1.8 12/03/2014   No results found for: VD25OH  No results found for: PREALBUMIN CBC EXTENDED Latest Ref Rng & Units 11/27/2018 10/20/2018 09/09/2018  WBC 4.0 - 10.5 K/uL 9.0 8.4 9.8  RBC 4.22 - 5.81 MIL/uL 4.16(L) 4.87 4.76  HGB 13.0 - 17.0 g/dL 11.4(L) 13.5 13.4  HCT 39.0 - 52.0 % 34.7(L) 41.7 40.9  PLT 150 - 400 K/uL 408(H) 300 349  NEUTROABS 1.7 - 7.7 K/uL - - -  LYMPHSABS 0.7 - 4.0 K/uL - - -     Body mass index is 23.09 kg/m.  Orders:  No orders of  the defined types were placed in this encounter.  No orders of the defined types were placed in this encounter.    Procedures: No procedures performed  Clinical Data: No additional findings.  ROS:  All other systems negative, except as noted in the HPI. Review of Systems  Objective: Vital Signs: Ht 6\' 1"  (1.854 m)   Wt 175 lb (79.4 kg)   BMI 23.09 kg/m   Specialty Comments:  No specialty comments available.  PMFS History: Patient Active Problem List   Diagnosis Date Noted  . 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 Hyperlipidemia 11/25/2018  . Carotid artery disease (HCC) 11/25/2018  . Critical limb ischemia with history of revascularization of same extremity 10/19/2018  . Below-knee amputation of right lower extremity (HCC) 09/09/2018  .  Dehiscence of amputation stump (HCC) 08/18/2018  . H/O amputation of lesser toe, left (HCC) 05/06/2018  . Peripheral vascular disease (HCC) 04/19/2018  . Diabetic foot infection (HCC) 04/18/2018  . Tobacco abuse counseling   . Critical lower limb ischemia 05/02/2017  . Subacute osteomyelitis of right fibula (HCC)   . 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 Diabetes mellitus, insulin dependent (IDDM), controlled   . Type 2 diabetes mellitus with diabetic neuropathy, with long-term current use of insulin (HCC)   . S/P unilateral BKA (below knee amputation) (HCC)   . Rheumatoid arthritis (HCC)   . Gallstones 09/23/2013  . Non-intractable cyclical vomiting with nausea 09/23/2013  . Epidermal inclusion cyst 11/30/2012   Past Medical History:  Diagnosis Date  . Arthritis    "pretty much all over"  . Cellulitis of left foot   . COPD (chronic obstructive pulmonary disease) (HCC)   . Dyspnea   . Hyperlipidemia   . Hypertension   . Neuromuscular disorder (HCC)    neuropathy legs  . Neuropathy    legs  . Osteomyelitis (HCC) 04/2018   4th toe left foot  . Osteomyelitis of fourth toe of left foot (HCC) 04/19/2018  . Peripheral vascular disease (HCC)   . Type II diabetes mellitus (HCC)   . Wears glasses   . Wears partial dentures    top and bottom partials    Family History  Problem Relation Age of Onset  . Cirrhosis Father     Past Surgical History:  Procedure Laterality Date  . ABDOMINAL AORTOGRAM W/LOWER EXTREMITY Bilateral 10/19/2018   Procedure: ABDOMINAL AORTOGRAM W/LOWER EXTREMITY;  Surgeon: 10/21/2018, MD;  Location: [facility] INVASIVE CV LAB;  Service: Cardiovascular;  Laterality: Bilateral;  . ABOVE KNEE LEG AMPUTATION Right 11/27/2018  . AMPUTATION Left 04/22/2018   Procedure: LEFT FOOT 4TH RAY AMPUTATION;  Surgeon: 04/24/2018, MD;  Location: [facility] OR;  Service: Orthopedics;  Laterality: Left;  . AMPUTATION Right 11/27/2018    Procedure: RIGHT ABOVE KNEE AMPUTATION;  Surgeon: 11/29/2018, MD;  Location: [facility];  Service: Orthopedics;  Laterality: Right;  . BACK SURGERY    . CHOLECYSTECTOMY N/A 10/26/2013   Procedure: LAPAROSCOPIC CHOLECYSTECTOMY WITH INTRAOPERATIVE CHOLANGIOGRAM;  Surgeon: 10/28/2013. Cornett, MD;  Location: [facility] OR;  Service: General;  Laterality: N/A;  . COLONOSCOPY    . CYST EXCISION Bilateral 02/15/2015   Procedure: LEFT INDEX FINGER AND RIGHT MIDDLE FINGER NODULE EXCISION;  Surgeon: 02/17/2015, MD;  Location: [facility];  Service: Orthopedics;  Laterality: Bilateral;  . CYST EXCISION PERINEAL N/A 12/08/2012   Procedure: CYST EXCISION PERINeum;  Surgeon: 02/07/2013 A. Cornett,  MD;  Location: [place];  Service: General;  Laterality: N/A;  . FOOT AMPUTATION Right 2005  . I & D EXTREMITY Right 03/07/2017   Procedure: EXCISION FIBULAR HEAD RIGHT BELOW KNEE AMPUTATION;  Surgeon: Newt Minion, MD;  Location: [place];  Service: Orthopedics;  Laterality: Right;  . INCISE AND DRAIN ABCESS  12/02/2014   PERINEAL ABSCESS  . INCISION AND DRAINAGE PERIRECTAL ABSCESS Left 12/02/2014   Procedure: IRRIGATION AND DEBRIDEMENT PERINEAL ABSCESS;  Surgeon: Coralie Keens, MD;  Location: [place];  Service: General;  Laterality: Left;  . LEG AMPUTATION BELOW KNEE Right 2005  . LOWER EXTREMITY ANGIOGRAPHY N/A 05/05/2017   Procedure: LOWER EXTREMITY ANGIOGRAPHY;  Surgeon: Lorretta Harp, MD;  Location: [place] CV LAB;  Service: Cardiovascular;  Laterality: N/A;  . LUMBAR DISC SURGERY  82,90   ruptured disc  . PERIPHERAL VASCULAR INTERVENTION Right 05/05/2017   Procedure: PERIPHERAL VASCULAR INTERVENTION;  Surgeon: Lorretta Harp, MD;  Location: [place] CV LAB;  Service: Cardiovascular;  Laterality: Right;  . PERIPHERAL VASCULAR INTERVENTION Right 10/19/2018   Procedure: PERIPHERAL VASCULAR INTERVENTION;  Surgeon: Lorretta Harp, MD;  Location: [place] CV LAB;  Service:  Cardiovascular;  Laterality: Right;  . SCAR REVISION Right 2005   @ amputation  . STUMP REVISION Right 08/21/2018   Procedure: REVISION RIGHT BELOW KNEE AMPUTATION, EXCISION FIBULA;  Surgeon: Newt Minion, MD;  Location: [place];  Service: Orthopedics;  Laterality: Right;  . STUMP REVISION Right 09/09/2018   Procedure: REVISION RIGHT BELOW KNEE AMPUTATION;  Surgeon: Newt Minion, MD;  Location: [place];  Service: Orthopedics;  Laterality: Right;  . TOE AMPUTATION Right 2005   Social History   Occupational History  . Not on file  Tobacco Use  . Smoking status: Former Smoker    Packs/day: 1.00    Years: 47.00    Pack years: 47.00    Types: Cigarettes    Quit date: 10/03/2018    Years since quitting: 0.4  . Smokeless tobacco: Never Used  Substance and Sexual Activity  . Alcohol use: No  . Drug use: No  . Sexual activity: Not Currently

## 2019-04-12 ENCOUNTER — Encounter: Payer: Self-pay | Admitting: Orthopedic Surgery

## 2019-04-12 ENCOUNTER — Other Ambulatory Visit: Payer: Self-pay

## 2019-04-12 ENCOUNTER — Ambulatory Visit (INDEPENDENT_AMBULATORY_CARE_PROVIDER_SITE_OTHER): Payer: Medicare (Managed Care) | Admitting: Orthopedic Surgery

## 2019-04-12 DIAGNOSIS — M1611 Unilateral primary osteoarthritis, right hip: Secondary | ICD-10-CM | POA: Diagnosis not present

## 2019-04-12 MED ORDER — GABAPENTIN 300 MG PO CAPS: 300.0000 mg | ORAL_CAPSULE | Freq: Three times a day (TID) | ORAL | 3 refills | Status: DC

## 2019-04-12 NOTE — Progress Notes (Signed)
Office Visit Note   Patient: Brian Zavala           Date of Birth: [birth year]           MRN: [id number] Visit Date: 04/12/2019              Requested by: [employer], [place] [facility] [place] [address] [place],  [place] [address] PCP: Inc, [place] [facility]  Chief Complaint  Patient presents with  . Right Knee - Follow-up      HPI: This is a pleasant gentleman who is 4-1/2 months status post right above-knee amputation his biggest complaint is of a nerve type pain at the end of the stump that when it has pressure against it such as when using his prosthetic sends a shooting pain up the lateral side of his leg he has worked with the prosthetists to ensure more padding in his socket.  Still it is bad enough that he often does not wear his leg.  Assessment & Plan: Visit Diagnoses: No diagnosis found.  Plan: We will order some gabapentin and see how he does with this.  Follow-Up Instructions: No follow-ups on file.   Ortho Exam  Patient is alert, oriented, no adenopathy, well-dressed, normal affect, normal respiratory effort. Right AKA stump is clinically healed no erythema no fluctuance.  There is a very small thin callus and above this palpated it recreates his neuropathic symptoms  Imaging: No results found. No images are attached to the encounter.  Labs: Lab Results  Component Value Date   HGBA1C 9.0 (H) 11/27/2018   HGBA1C 7.6 (H) 08/21/2018   HGBA1C 8.0 (H) 04/19/2018   ESRSEDRATE 46 (H) 04/18/2018   ESRSEDRATE 2 12/04/2017   CRP 1.4 (H) 04/21/2018   CRP 6.8 (H) 04/18/2018   CRP 3.1 12/04/2017   REPTSTATUS 09/18/2018 FINAL 09/09/2018   GRAMSTAIN  09/09/2018    ABUNDANT WBC PRESENT,BOTH PMN AND MONONUCLEAR FEW GRAM POSITIVE COCCI Performed at [facility], [address] [place], [place] [address]    CULT  09/09/2018    MODERATE STAPHYLOCOCCUS AUREUS FEW PEPTOSTREPTOCOCCUS SPECIES    LABORGA STAPHYLOCOCCUS AUREUS  09/09/2018     Lab Results  Component Value Date   ALBUMIN 3.0 (L) 04/19/2018   ALBUMIN 3.3 (L) 04/18/2018   ALBUMIN 3.0 (L) 12/02/2014    Lab Results  Component Value Date   MG 1.8 12/03/2014   No results found for: VD25OH  No results found for: PREALBUMIN CBC EXTENDED Latest Ref Rng & Units 11/27/2018 10/20/2018 09/09/2018  WBC 4.0 - 10.5 K/uL 9.0 8.4 9.8  RBC 4.22 - 5.81 MIL/uL 4.16(L) 4.87 4.76  HGB 13.0 - 17.0 g/dL 11.4(L) 13.5 13.4  HCT 39.0 - 52.0 % 34.7(L) 41.7 40.9  PLT 150 - 400 K/uL 408(H) 300 349  NEUTROABS 1.7 - 7.7 K/uL - - -  LYMPHSABS 0.7 - 4.0 K/uL - - -     There is no height or weight on file to calculate BMI.  Orders:  No orders of the defined types were placed in this encounter.  No orders of the defined types were placed in this encounter.    Procedures: No procedures performed  Clinical Data: No additional findings.  ROS:  All other systems negative, except as noted in the HPI. Review of Systems  Objective: Vital Signs: There were no vitals taken for this visit.  Specialty Comments:  No specialty comments available.  PMFS History: Patient Active Problem  List   Diagnosis Date Noted  . Acquired absence of right leg above knee ([place]) 11/27/2018  . Hyperlipidemia 11/25/2018  . Carotid artery disease ([place]) 11/25/2018  . Critical limb ischemia with history of revascularization of same extremity 10/19/2018  . Below-knee amputation of right lower extremity ([place]) 09/09/2018  . Dehiscence of amputation stump ([place]) 08/18/2018  . H/O amputation of lesser toe, left ([place]) 05/06/2018  . Peripheral vascular disease ([place]) 04/19/2018  . Diabetic foot infection ([place]) 04/18/2018  . Tobacco abuse counseling   . Critical lower limb ischemia 05/02/2017  . Subacute osteomyelitis of right fibula ([place])   . 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, right, sequela ([place]) 02/03/2017  . Diabetes mellitus, insulin dependent (IDDM), controlled   . Type  2 diabetes mellitus with diabetic neuropathy, with long-term current use of insulin ([place])   . S/P unilateral BKA (below knee amputation) ([place])   . Rheumatoid arthritis ([place])   . Gallstones 09/23/2013  . Non-intractable cyclical vomiting with nausea 09/23/2013  . Epidermal inclusion cyst 11/30/2012   Past Medical History:  Diagnosis Date  . Arthritis    "pretty much all over"  . Cellulitis of left foot   . COPD (chronic obstructive pulmonary disease) ([place])   . Dyspnea   . Hyperlipidemia   . Hypertension   . Neuromuscular disorder (HCC)    neuropathy legs  . Neuropathy    legs  . Osteomyelitis ([place]) 04/2018   4th toe left foot  . Osteomyelitis of fourth toe of left foot ([place]) 04/19/2018  . Peripheral vascular disease ([place])   . Type II diabetes mellitus ([place])   . Wears glasses   . Wears partial dentures    top and bottom partials    Family History  Problem Relation Age of Onset  . Cirrhosis Father     Past Surgical History:  Procedure Laterality Date  . ABDOMINAL AORTOGRAM W/LOWER EXTREMITY Bilateral 10/19/2018   Procedure: ABDOMINAL AORTOGRAM W/LOWER EXTREMITY;  Surgeon: Lorretta Harp, MD;  Location: [place] CV LAB;  Service: Cardiovascular;  Laterality: Bilateral;  . ABOVE KNEE LEG AMPUTATION Right 11/27/2018  . AMPUTATION Left 04/22/2018   Procedure: LEFT FOOT 4TH RAY AMPUTATION;  Surgeon: Newt Minion, MD;  Location: [place];  Service: Orthopedics;  Laterality: Left;  . AMPUTATION Right 11/27/2018   Procedure: RIGHT ABOVE KNEE AMPUTATION;  Surgeon: Newt Minion, MD;  Location: [place];  Service: Orthopedics;  Laterality: Right;  . BACK SURGERY    . CHOLECYSTECTOMY N/A 10/26/2013   Procedure: LAPAROSCOPIC CHOLECYSTECTOMY WITH INTRAOPERATIVE CHOLANGIOGRAM;  Surgeon: Joyice Faster. Cornett, MD;  Location: [place];  Service: General;  Laterality: N/A;  . COLONOSCOPY    . CYST EXCISION Bilateral 02/15/2015   Procedure: LEFT INDEX FINGER AND RIGHT MIDDLE FINGER NODULE EXCISION;   Surgeon: Leandrew Koyanagi, MD;  Location: [place];  Service: Orthopedics;  Laterality: Bilateral;  . CYST EXCISION PERINEAL N/A 12/08/2012   Procedure: CYST EXCISION PERINeum;  Surgeon: Marcello Moores A. Cornett, MD;  Location: [place];  Service: General;  Laterality: N/A;  . FOOT AMPUTATION Right 2005  . I & D EXTREMITY Right 03/07/2017   Procedure: EXCISION FIBULAR HEAD RIGHT BELOW KNEE AMPUTATION;  Surgeon: Newt Minion, MD;  Location: [place];  Service: Orthopedics;  Laterality: Right;  . INCISE AND DRAIN ABCESS  12/02/2014   PERINEAL ABSCESS  . INCISION AND DRAINAGE PERIRECTAL ABSCESS Left 12/02/2014   Procedure: IRRIGATION AND DEBRIDEMENT PERINEAL ABSCESS;  Surgeon: Nathaneil Canary  Magnus Ivan, MD;  [facility];  Service: General;  Laterality: Left;  . LEG AMPUTATION BELOW KNEE Right 2005  . LOWER EXTREMITY ANGIOGRAPHY N/A 05/05/2017   Procedure: LOWER EXTREMITY ANGIOGRAPHY;  Surgeon: [name], MD;  [facility] LAB;  Service: Cardiovascular;  Laterality: N/A;  . LUMBAR DISC SURGERY  82,90   ruptured disc  . PERIPHERAL VASCULAR INTERVENTION Right 05/05/2017   Procedure: PERIPHERAL VASCULAR INTERVENTION;  Surgeon: [name], MD;  Location: [facility] INVASIVE CV LAB;  Service: Cardiovascular;  Laterality: Right;  . PERIPHERAL VASCULAR INTERVENTION Right 10/19/2018   Procedure: PERIPHERAL VASCULAR INTERVENTION;  Surgeon: [name], MD;  Location: [facility] INVASIVE CV LAB;  Service: Cardiovascular;  Laterality: Right;  . SCAR REVISION Right 2005   @ amputation  . STUMP REVISION Right 08/21/2018   Procedure: REVISION RIGHT BELOW KNEE AMPUTATION, EXCISION FIBULA;  Surgeon: [name], MD;  [facility];  Service: Orthopedics;  Laterality: Right;  . STUMP REVISION Right 09/09/2018   Procedure: REVISION RIGHT BELOW KNEE AMPUTATION;  Surgeon: [name], MD;  Location: [facility] OR;  Service: Orthopedics;  Laterality: Right;  . TOE AMPUTATION Right 2005    Social History   Occupational History  . Not on file  Tobacco Use  . Smoking status: Former Smoker    Packs/day: 1.00    Years: 47.00    Pack years: 47.00    Types: Cigarettes    Quit date: 10/03/2018    Years since quitting: 0.5  . Smokeless tobacco: Never Used  Substance and Sexual Activity  . Alcohol use: No  . Drug use: No  . Sexual activity: Not Currently

## 2019-05-10 ENCOUNTER — Ambulatory Visit (INDEPENDENT_AMBULATORY_CARE_PROVIDER_SITE_OTHER): Payer: Medicare (Managed Care) | Admitting: Orthopedic Surgery

## 2019-05-10 ENCOUNTER — Other Ambulatory Visit: Payer: Self-pay

## 2019-05-10 ENCOUNTER — Encounter: Payer: Self-pay | Admitting: Orthopedic Surgery

## 2019-05-10 VITALS — Ht 73.0 in | Wt 175.0 lb

## 2019-05-10 DIAGNOSIS — M86261 Subacute osteomyelitis, right tibia and fibula: Secondary | ICD-10-CM

## 2019-05-10 NOTE — Progress Notes (Signed)
Office Visit Note   Patient: Brian Zavala           Date of Birth: Jul 21, 1950           MRN: [id number] Visit Date: 05/10/2019              Requested by: [employer], [facility] [address] [place],  [place] [address] PCP: Inc, [facility]  Chief Complaint  Patient presents with  . Right Leg - Follow-up    11/27/18 right AKA       HPI: The patient presents today 6 months status post right above-knee amputation.  He is actually doing very very well his only complaint is of 1 place on his amputation stump but still has quite a bit of nerve pain.  It is sensitive to touch.  It has slightly improved since his last visit  Assessment & Plan: Visit Diagnoses: No diagnosis found.  Plan: I told him this may be a year to take this to calm down.  He should desensitize the area he may follow-up if needed or if this worsens  Follow-Up Instructions: No follow-ups on file.   Ortho Exam  Patient is alert, oriented, no adenopathy, well-dressed, normal affect, normal respiratory effort. Right above-knee amputation stump is completely healed there is no areas of fluctuance there is no drainage there is no cellulitis he has 1 area that is hypersensitive to light touch.  But there is no surrounding erythema or evidence of any type of infection  Imaging: No results found. No images are attached to the encounter.  Labs: Lab Results  Component Value Date   HGBA1C 9.0 (H) 11/27/2018   HGBA1C 7.6 (H) 08/21/2018   HGBA1C 8.0 (H) 04/19/2018   ESRSEDRATE 46 (H) 04/18/2018   ESRSEDRATE 2 12/04/2017   CRP 1.4 (H) 04/21/2018   CRP 6.8 (H) 04/18/2018   CRP 3.1 12/04/2017   REPTSTATUS 09/18/2018 FINAL 09/09/2018   GRAMSTAIN  09/09/2018    ABUNDANT WBC PRESENT,BOTH PMN AND MONONUCLEAR FEW GRAM POSITIVE COCCI Performed at [facility], [address], [place], [place] [address]    CULT  09/09/2018    MODERATE STAPHYLOCOCCUS AUREUS FEW  PEPTOSTREPTOCOCCUS SPECIES    LABORGA STAPHYLOCOCCUS AUREUS 09/09/2018     Lab Results  Component Value Date   ALBUMIN 3.0 (L) 04/19/2018   ALBUMIN 3.3 (L) 04/18/2018   ALBUMIN 3.0 (L) 12/02/2014    Lab Results  Component Value Date   MG 1.8 12/03/2014   No results found for: VD25OH  No results found for: PREALBUMIN CBC EXTENDED Latest Ref Rng & Units 11/27/2018 10/20/2018 09/09/2018  WBC 4.0 - 10.5 K/uL 9.0 8.4 9.8  RBC 4.22 - 5.81 MIL/uL 4.16(L) 4.87 4.76  HGB 13.0 - 17.0 g/dL 11.4(L) 13.5 13.4  HCT 39.0 - 52.0 % 34.7(L) 41.7 40.9  PLT 150 - 400 K/uL 408(H) 300 349  NEUTROABS 1.7 - 7.7 K/uL - - -  LYMPHSABS 0.7 - 4.0 K/uL - - -     Body mass index is 23.09 kg/m.  Orders:  No orders of the defined types were placed in this encounter.  No orders of the defined types were placed in this encounter.    Procedures: No procedures performed  Clinical Data: No additional findings.  ROS:  All other systems negative, except as noted in the HPI. Review of Systems  Objective: Vital Signs: Ht 6\' 1"  (1.854 m)   Wt 175 lb (79.4  kg)   BMI 23.09 kg/m   Specialty Comments:  No specialty comments available.  PMFS History: Patient Active Problem List   Diagnosis Date Noted  . 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 Hyperlipidemia 11/25/2018  . Carotid artery disease (HCC) 11/25/2018  . Critical limb ischemia with history of revascularization of same extremity 10/19/2018  . Below-knee amputation of right lower extremity (HCC) 09/09/2018  . Dehiscence of amputation stump (HCC) 08/18/2018  . H/O amputation of lesser toe, left (HCC) 05/06/2018  . Peripheral vascular disease (HCC) 04/19/2018  . Diabetic foot infection (HCC) 04/18/2018  . Tobacco abuse counseling   . Critical lower limb ischemia 05/02/2017  . Subacute osteomyelitis of right fibula (HCC)   . 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  Diabetes mellitus, insulin dependent (IDDM), controlled   . Type 2 diabetes mellitus with diabetic neuropathy, with long-term current use of insulin (HCC)   . S/P unilateral BKA (below knee amputation) (HCC)   . Rheumatoid arthritis (HCC)   . Gallstones 09/23/2013  . Non-intractable cyclical vomiting with nausea 09/23/2013  . Epidermal inclusion cyst 11/30/2012   Past Medical History:  Diagnosis Date  . Arthritis    "pretty much all over"  . Cellulitis of left foot   . COPD (chronic obstructive pulmonary disease) (HCC)   . Dyspnea   . Hyperlipidemia   . Hypertension   . Neuromuscular disorder (HCC)    neuropathy legs  . Neuropathy    legs  . Osteomyelitis (HCC) 04/2018   4th toe left foot  . Osteomyelitis of fourth toe of left foot (HCC) 04/19/2018  . Peripheral vascular disease (HCC)   . Type II diabetes mellitus (HCC)   . Wears glasses   . Wears partial dentures    top and bottom partials    Family History  Problem Relation Age of Onset  . Cirrhosis Father     Past Surgical History:  Procedure Laterality Date  . ABDOMINAL AORTOGRAM W/LOWER EXTREMITY Bilateral 10/19/2018   Procedure: ABDOMINAL AORTOGRAM W/LOWER EXTREMITY;  Surgeon: [name], MD;  Location: [facility] INVASIVE CV LAB;  Service: Cardiovascular;  Laterality: Bilateral;  . ABOVE KNEE LEG AMPUTATION Right 11/27/2018  . AMPUTATION Left 04/22/2018   Procedure: LEFT FOOT 4TH RAY AMPUTATION;  Surgeon: [name], MD;  Location: [facility] OR;  Service: Orthopedics;  Laterality: Left;  . AMPUTATION Right 11/27/2018   Procedure: RIGHT ABOVE KNEE AMPUTATION;  Surgeon: [name], MD;  Location: [facility] OR;  Service: Orthopedics;  Laterality: Right;  . BACK SURGERY    . CHOLECYSTECTOMY N/A 10/26/2013   Procedure: LAPAROSCOPIC CHOLECYSTECTOMY WITH INTRAOPERATIVE CHOLANGIOGRAM;  Surgeon: Clovis Pu. Cornett, MD;  Location: [facility] OR;  Service: General;  Laterality: N/A;  . COLONOSCOPY    . CYST EXCISION Bilateral 02/15/2015    Procedure: LEFT INDEX FINGER AND RIGHT MIDDLE FINGER NODULE EXCISION;  Surgeon: Tarry Kos, MD;  Location: [facility];  Service: Orthopedics;  Laterality: Bilateral;  . CYST EXCISION PERINEAL N/A 12/08/2012   Procedure: CYST EXCISION PERINeum;  Surgeon: Maisie Fus A. Cornett, MD;  Location: [facility];  Service: General;  Laterality: N/A;  . FOOT AMPUTATION Right 2005  . I & D EXTREMITY Right 03/07/2017   Procedure: EXCISION FIBULAR HEAD RIGHT BELOW KNEE AMPUTATION;  Surgeon: [name], MD;  Location: [facility] OR;  Service: Orthopedics;  Laterality: Right;  . INCISE AND DRAIN ABCESS  12/02/2014   PERINEAL  ABSCESS  . INCISION AND DRAINAGE PERIRECTAL ABSCESS Left 12/02/2014   Procedure: IRRIGATION AND DEBRIDEMENT PERINEAL ABSCESS;  Surgeon: Coralie Keens, MD;  Location: [facility];  Service: General;  Laterality: Left;  . LEG AMPUTATION BELOW KNEE Right 2005  . LOWER EXTREMITY ANGIOGRAPHY N/A 05/05/2017   Procedure: LOWER EXTREMITY ANGIOGRAPHY;  Surgeon: Lorretta Harp, MD;  Location: [facility] CV LAB;  Service: Cardiovascular;  Laterality: N/A;  . LUMBAR DISC SURGERY  82,90   ruptured disc  . PERIPHERAL VASCULAR INTERVENTION Right 05/05/2017   Procedure: PERIPHERAL VASCULAR INTERVENTION;  Surgeon: Lorretta Harp, MD;  Location: [facility] CV LAB;  Service: Cardiovascular;  Laterality: Right;  . PERIPHERAL VASCULAR INTERVENTION Right 10/19/2018   Procedure: PERIPHERAL VASCULAR INTERVENTION;  Surgeon: Lorretta Harp, MD;  Location: [facility] CV LAB;  Service: Cardiovascular;  Laterality: Right;  . SCAR REVISION Right 2005   @ amputation  . STUMP REVISION Right 08/21/2018   Procedure: REVISION RIGHT BELOW KNEE AMPUTATION, EXCISION FIBULA;  Surgeon: Newt Minion, MD;  Location: [facility];  Service: Orthopedics;  Laterality: Right;  . STUMP REVISION Right 09/09/2018   Procedure: REVISION RIGHT BELOW KNEE AMPUTATION;  Surgeon: Newt Minion, MD;  Location: [facility];  Service:  Orthopedics;  Laterality: Right;  . TOE AMPUTATION Right 2005   Social History   Occupational History  . Not on file  Tobacco Use  . Smoking status: Former Smoker    Packs/day: 1.00    Years: 47.00    Pack years: 47.00    Types: Cigarettes    Quit date: 10/03/2018    Years since quitting: 0.6  . Smokeless tobacco: Never Used  Substance and Sexual Activity  . Alcohol use: No  . Drug use: No  . Sexual activity: Not Currently

## 2019-05-31 ENCOUNTER — Telehealth: Payer: Self-pay | Admitting: Cardiovascular Disease

## 2019-05-31 NOTE — Telephone Encounter (Signed)
LMOM RE: F/U Visit--- AF 

## 2019-06-29 ENCOUNTER — Ambulatory Visit (INDEPENDENT_AMBULATORY_CARE_PROVIDER_SITE_OTHER): Payer: Medicare (Managed Care) | Admitting: Cardiovascular Disease

## 2019-06-29 ENCOUNTER — Other Ambulatory Visit: Payer: Self-pay

## 2019-06-29 ENCOUNTER — Encounter: Payer: Self-pay | Admitting: Cardiovascular Disease

## 2019-06-29 DIAGNOSIS — I6523 Occlusion and stenosis of bilateral carotid arteries: Secondary | ICD-10-CM

## 2019-06-29 DIAGNOSIS — Z89519 Acquired absence of unspecified leg below knee: Secondary | ICD-10-CM

## 2019-06-29 DIAGNOSIS — E782 Mixed hyperlipidemia: Secondary | ICD-10-CM

## 2019-06-29 DIAGNOSIS — I998 Other disorder of circulatory system: Secondary | ICD-10-CM | POA: Diagnosis not present

## 2019-06-29 DIAGNOSIS — Z716 Tobacco abuse counseling: Secondary | ICD-10-CM

## 2019-06-29 DIAGNOSIS — I70229 Atherosclerosis of native arteries of extremities with rest pain, unspecified extremity: Secondary | ICD-10-CM

## 2019-06-29 LAB — LIPID PANEL
Chol/HDL Ratio: 2.8 ratio (ref 0.0–5.0)
Cholesterol, Total: 86 mg/dL — ABNORMAL LOW (ref 100–199)
HDL: 31 mg/dL — ABNORMAL LOW (ref >39)
LDL Chol Calc (NIH): 36 mg/dL (ref 0–99)
Triglycerides: 95 mg/dL (ref 0–149)
VLDL Cholesterol Cal: 19 mg/dL (ref 5–40)

## 2019-06-29 LAB — HEPATIC FUNCTION PANEL
ALT: 51 IU/L — ABNORMAL HIGH (ref 0–44)
AST: 35 IU/L (ref 0–40)
Albumin: 3.9 g/dL (ref 3.8–4.8)
Alkaline Phosphatase: 86 IU/L (ref 39–117)
Bilirubin Total: 0.3 mg/dL (ref 0.0–1.2)
Bilirubin, Direct: 0.16 mg/dL (ref 0.00–0.40)
Total Protein: 6.7 g/dL (ref 6.0–8.5)

## 2019-06-29 NOTE — Assessment & Plan Note (Signed)
History of hyperlipidemia on statin therapy.  We will we repeat lipid liver profile today

## 2019-06-29 NOTE — Assessment & Plan Note (Signed)
History of moderate bilateral ICA stenosis by duplex ultrasound 08/03/2018.  Neurologically asymptomatic.  We will repeat these in September of this year.

## 2019-06-29 NOTE — Progress Notes (Signed)
06/29/2019 Brian Zavala   01-Nov-1950  [id number]  [facility] Primary Cardiologist: [name] MD FACP, [facility], [place], [place]  HPI:  Brian Zavala is a 69 y.o.  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 He was referred by Dr. Lajoyce Corners for peripheral vascular evaluation because of critical limb ischemia.I last saw him in the office  11/25/2018. He has a history of 75-pack-years of tobacco abuse currently smoking 1-1/2 packs a day as well as 2 hypertension, diabetes and hyperlipidemia. He has never had a heart attack or stroke. 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 In June of last year he developed leg stump ulcer which has not healed. Dopplers performed in our office yesterday showed an occluded right SFA.. I performed angiography on him 05/05/17 revealing an occluded right SFA from the origin down the adductor canal which I was able to revascularize using directional atherectomy with drug-eluting balloon angioplasty. Did have an occluded popliteal as well. Follow-up lower extremity arterial Doppler studies performed 05/14/17 showed marked improvement. He did also stop smoking at that time.  Hehad begun hyperbaric therapy and his right stump ulcer has completely healedafter 6 months. He did start smoking again. Unfortunately developed a recurrent ulcer several months ago and had repeat surgery by Dr. Lajoyce Corners 09/09/2018 with revision of his stump. Unfortunately, the wound has been slow to heal. Follow-up Dopplers performed today revealed occluded right SFA.  I performed peripheral angiography on him 10/19/2018 revealing occluded right SFA which I was able to revascularized using atherectomy and drug-coated balloon angioplasty.  Unfortunate there was slow flow although follow-up Doppler studies performed 11/04/2018 revealed his SFA to be widely patent.  He continues to have  infection in his stump with severe pain and I suspect will ultimately require right above-the-knee amputation.  He did stop smoking on August 1 however.  Since I saw him in August of last year he continues to do well.  His right AKA stump is well-healed however he is not wearing a prosthesis because of pain when he puts it on.  He denies chest pain or shortness of breath.   Current Meds  Medication Sig  . aspirin 81 MG chewable tablet Chew 81 mg by mouth daily.  Marland Kitchen atorvastatin (LIPITOR) 80 MG tablet Take 1 tablet (80 mg total) by mouth daily at 6 PM.  . calcium carbonate (OSCAL) 1500 (600 Ca) MG TABS tablet Take 600 mg of elemental calcium by mouth daily with breakfast.   . cholecalciferol (VITAMIN D) 1000 UNITS tablet Take 1,000 Units by mouth daily.  . clopidogrel (PLAVIX) 75 MG tablet Take 1 tablet (75 mg total) by mouth daily with breakfast.  . docusate sodium (COLACE) 100 MG capsule Take 100 mg by mouth 2 (two) times daily.  . furosemide (LASIX) 20 MG tablet Take 20 mg by mouth daily.   Marland Kitchen gabapentin (NEURONTIN) 300 MG capsule Take 1 capsule (300 mg total) by mouth 3 (three) times daily. 3 times a day when necessary neuropathy pain  . hydrochlorothiazide (MICROZIDE) 12.5 MG capsule Take 12.5 mg by mouth daily.  . Insulin Glargine, 1 Unit Dial, (TOUJEO SOLOSTAR) 300 UNIT/ML SOPN Inject 40 Units into the skin daily with lunch. (Patient taking differently: Inject 80 Units into the skin daily with lunch. )  . leflunomide (ARAVA) 20 MG tablet Take 20 mg by mouth daily.  . Liraglutide (VICTOZA)  18 MG/3ML SOLN injection Inject 1.8 mg into the skin every evening.   . metFORMIN (GLUCOPHAGE) 1000 MG tablet Take 1,000 mg by mouth 2 (two) times daily.  Marland Kitchen morphine (MS CONTIN) 100 MG 12 hr tablet Take 100 mg by mouth every 12 (twelve) hours.  . Oxycodone HCl 10 MG TABS Take 1-1.5 tablets (10-15 mg total) by mouth every 4 (four) hours as needed (pain score 7-10).  Marland Kitchen oxymetazoline (AFRIN) 0.05 % nasal  spray Place 1 spray into both nostrils 2 (two) times daily as needed for congestion.  . predniSONE (DELTASONE) 1 MG tablet Take 2 mg by mouth daily with breakfast.  . pregabalin (LYRICA) 75 MG capsule Take 75 mg by mouth See admin instructions. Take 75 mg by mouth in the morning and 150 mg in the afternoon  . Probiotic Product (PROBIOTIC DAILY PO) Take 1 capsule by mouth daily.  . vitamin B-12 (CYANOCOBALAMIN) 100 MCG tablet Take 100 mcg by mouth daily.     Allergies  Allergen Reactions  . Varenicline Nausea Only    Social History   Socioeconomic History  . Marital status: Divorced    Spouse name: Not on file  . Number of children: Not on file  . Years of education: Not on file  . Highest education level: Not on file  Occupational History  . Not on file  Tobacco Use  . Smoking status: Former Smoker    Packs/day: 1.00    Years: 47.00    Pack years: 47.00    Types: Cigarettes    Quit date: 10/03/2018    Years since quitting: 0.7  . Smokeless tobacco: Never Used  Substance and Sexual Activity  . Alcohol use: No  . Drug use: No  . Sexual activity: Not Currently  Other Topics Concern  . Not on file  Social History Narrative  . Not on file   Social Determinants of Health   Financial Resource Strain:   . Difficulty of Paying Living Expenses:   Food Insecurity:   . Worried About Charity fundraiser in the Last Year:   . Arboriculturist in the Last Year:   Transportation Needs:   . Film/video editor (Medical):   Marland Kitchen Lack of Transportation (Non-Medical):   Physical Activity:   . Days of Exercise per Week:   . Minutes of Exercise per Session:   Stress:   . Feeling of Stress :   Social Connections:   . Frequency of Communication with Friends and Family:   . Frequency of Social Gatherings with Friends and Family:   . Attends Religious Services:   . Active Member of Clubs or Organizations:   . Attends Archivist Meetings:   Marland Kitchen Marital Status:   Intimate  Partner Violence:   . Fear of Current or Ex-Partner:   . Emotionally Abused:   Marland Kitchen Physically Abused:   . Sexually Abused:      Review of Systems: General: negative for chills, fever, night sweats or weight changes.  Cardiovascular: negative for chest pain, dyspnea on exertion, edema, orthopnea, palpitations, paroxysmal nocturnal dyspnea or shortness of breath Dermatological: negative for rash Respiratory: negative for cough or wheezing Urologic: negative for hematuria Abdominal: negative for nausea, vomiting, diarrhea, bright red blood per rectum, melena, or hematemesis Neurologic: negative for visual changes, syncope, or dizziness All other systems reviewed and are otherwise negative except as noted above.    Blood pressure 136/78, pulse 93, height 6\' 1"  (1.854 m), weight 180 lb (81.6  kg), SpO2 98 %.  General appearance: alert and no distress Neck: no adenopathy, no JVD, supple, symmetrical, trachea midline, thyroid not enlarged, symmetric, no tenderness/mass/nodules and Bilateral carotid bruits Lungs: clear to auscultation bilaterally Heart: regular rate and rhythm, S1, S2 normal, no murmur, click, rub or gallop Extremities: extremities normal, atraumatic, no cyanosis or edema Pulses: Right AKA, absent pedal pulse left Skin: Skin color, texture, turgor normal. No rashes or lesions Neurologic: Alert and oriented X 3, normal strength and tone. Normal symmetric reflexes. Normal coordination and gait  EKG sinus rhythm at 91 with evidence of LVH with repolarization changes and left axis deviation.  I personally reviewed this EKG.  ASSESSMENT AND PLAN:   S/P unilateral BKA (below knee amputation) (HCC) Status post right lower extremity BKA by Dr.Duda in 2005 with revision to an AKA last year because of stump infection.  Critical lower limb ischemia History of critical limb ischemia with right BKA stump infection status post percutaneous revascularization of his occluded right SFA by  myself 05/05/2017 and again 10/19/2018.  His most recent lower extremity arterial Doppler studies performed in September of last year suggested his SFA remains patent.  Tobacco abuse counseling Discontinued  Hyperlipidemia History of hyperlipidemia on statin therapy.  We will we repeat lipid liver profile today  Carotid artery disease (HCC) History of moderate bilateral ICA stenosis by duplex ultrasound 08/03/2018.  Neurologically asymptomatic.  We will repeat these in September of this year.      [name] MD FACP,FACC,FAHA, [facility] 06/29/2019 8:20 AM

## 2019-06-29 NOTE — Patient Instructions (Signed)
Medication Instructions:  The current medical regimen is effective;  continue present plan and medications.  *If you need a refill on your cardiac medications before your next appointment, please call your pharmacy*   Lab Work: LIPID and LIVER today   If you have labs (blood work) drawn today and your tests are completely normal, you will receive your results only by: Marland Kitchen MyChart Message (if you have MyChart) OR . A paper copy in the mail If you have any lab test that is abnormal or we need to change your treatment, we will call you to review the results.   Testing/Procedures: Please put the Carotid and Lower [place] together on the same day in September.    Follow-Up: At [facility], you and your health needs are our priority.  As part of our continuing mission to provide you with exceptional heart care, we have created designated Provider Care Teams.  These Care Teams include your primary Cardiologist (physician) and Advanced Practice Providers (APPs -  Physician Assistants and Nurse Practitioners) who all work together to provide you with the care you need, when you need it.  We recommend signing up for the patient portal called "MyChart".  Sign up information is provided on this After Visit Summary.  MyChart is used to connect with patients for Virtual Visits (Telemedicine).  Patients are able to view lab/test results, encounter notes, upcoming appointments, etc.  Non-urgent messages can be sent to your provider as well.   To learn more about what you can do with MyChart, go to [email or web address].    Your next appointment:   12 month(s)  The format for your next appointment:   In Person  Provider:   Nanetta Batty, MD

## 2019-06-29 NOTE — Assessment & Plan Note (Signed)
Discontinued

## 2019-06-29 NOTE — Assessment & Plan Note (Signed)
History of critical limb ischemia with right BKA stump infection status post percutaneous revascularization of his occluded right SFA by myself 05/05/2017 and again 10/19/2018.  His most recent lower extremity arterial Doppler studies performed in September of last year suggested his SFA remains patent.

## 2019-06-29 NOTE — Assessment & Plan Note (Signed)
Status post right lower extremity BKA by Dr.Duda in 2005 with revision to an AKA last year because of stump infection.

## 2019-07-06 NOTE — Addendum Note (Signed)
Addended by: Brunetta Genera on: 07/06/2019 03:20 PM   Modules accepted: Orders

## 2019-08-02 IMAGING — DX DG HAND COMPLETE 3+V*L*
3 series · 3 of 3 positions shown · non-contrast
Comparison: None.

CLINICAL DATA: Fall.  Laceration between fourth and fifth digits

EXAM:
LEFT HAND - COMPLETE 3+ VIEW

[x hand pa left]
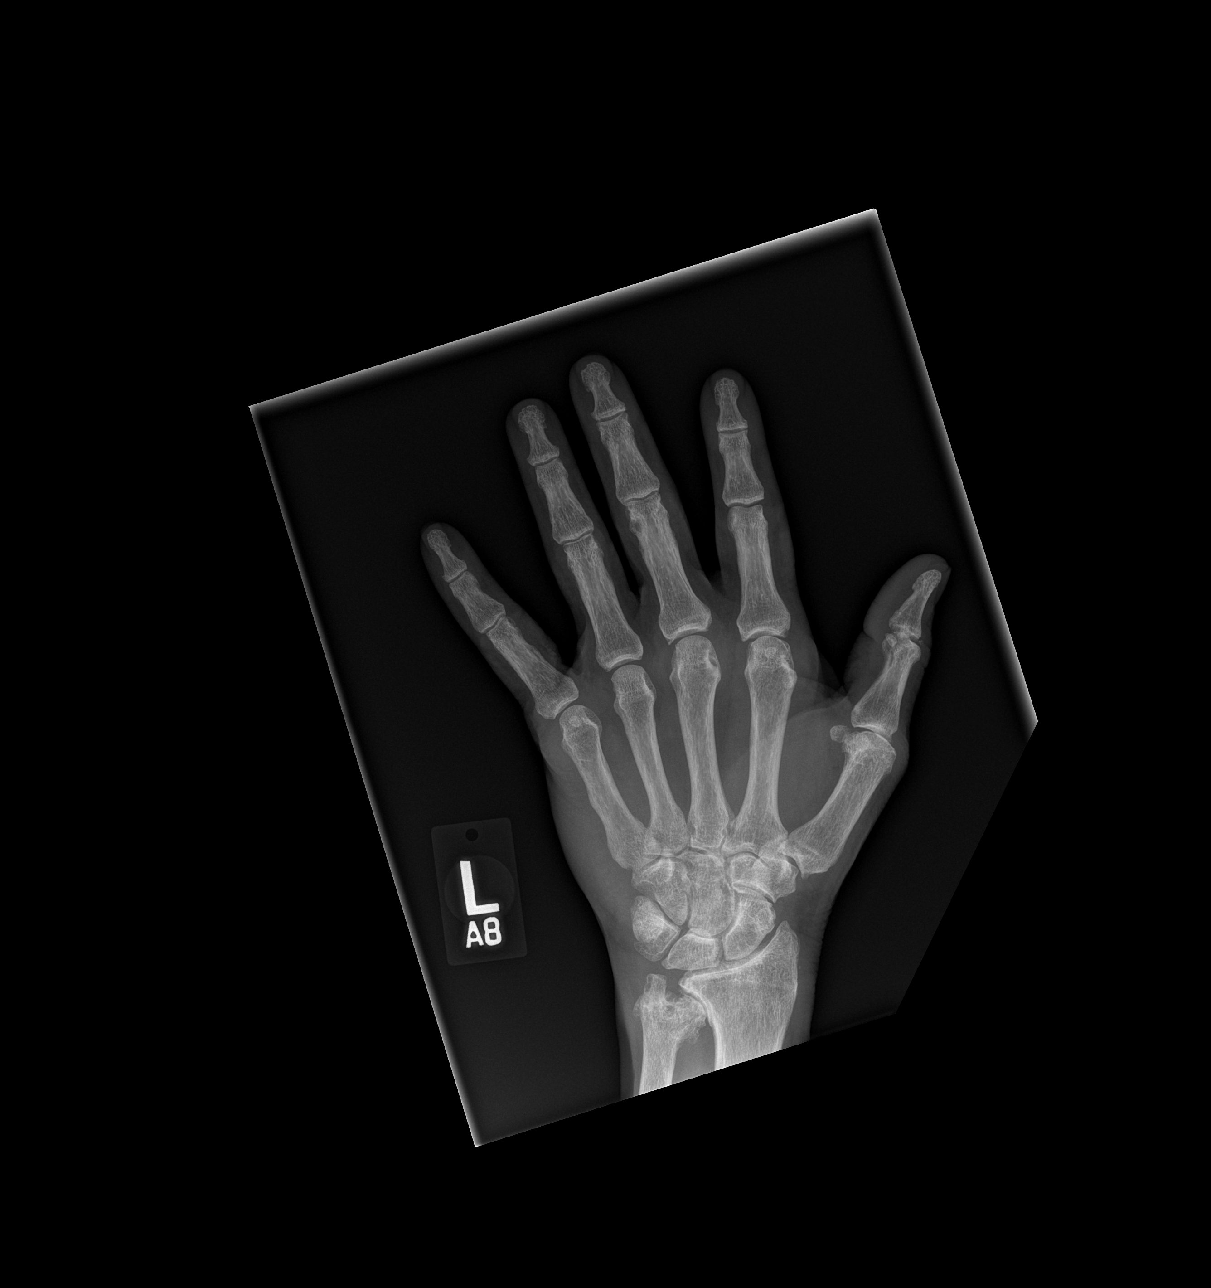

[x hand obl left]
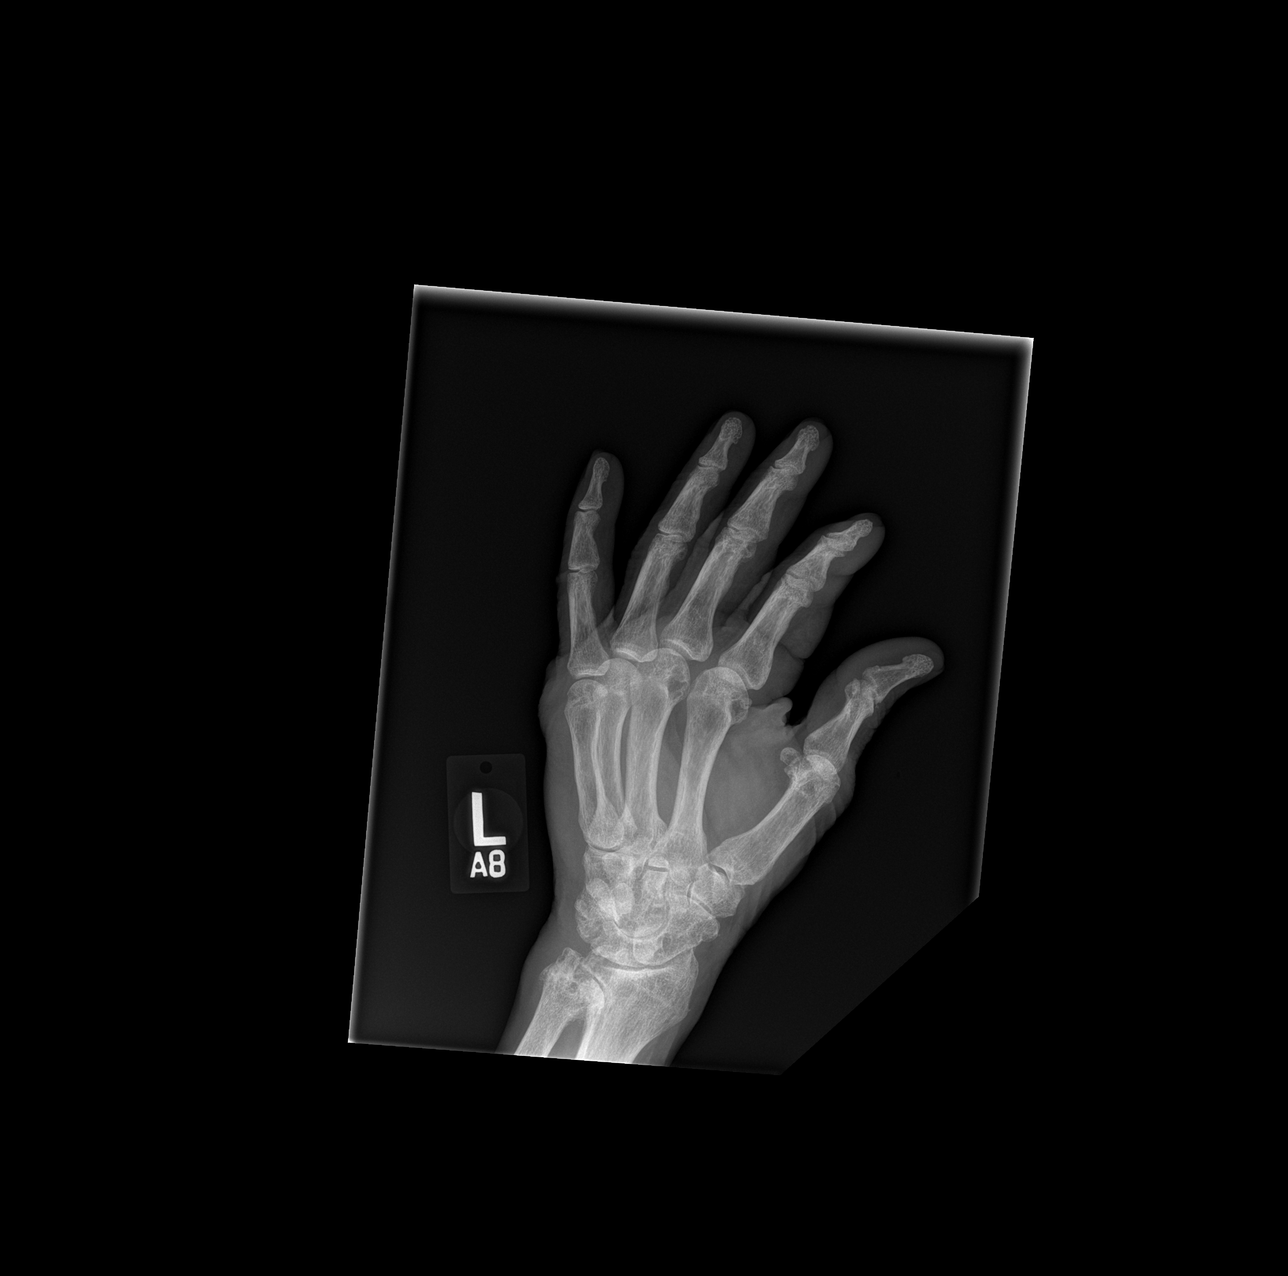

[x hand lat left]
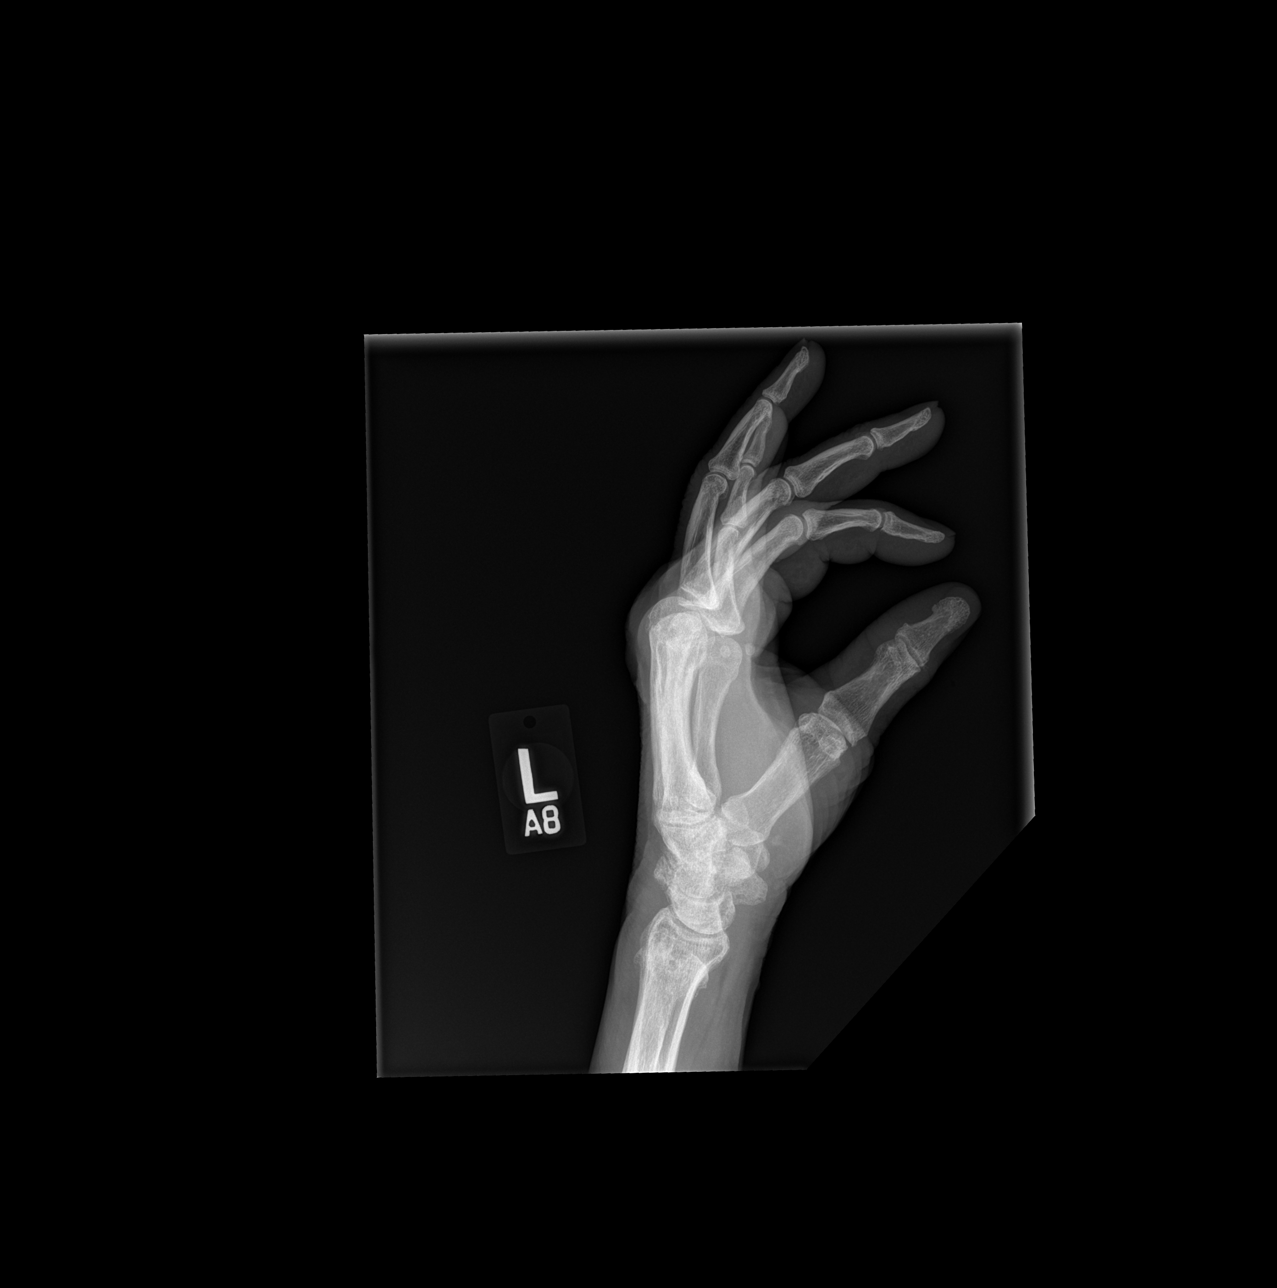

[3 of 3 positions shown; findings below may reference images not displayed]

FINDINGS: No acute bony abnormality. Specifically, no fracture, subluxation,
or dislocation. Soft tissues are intact. Advanced arthritic changes
noted in the wrist.
IMPRESSION: No acute bony abnormality.

## 2019-11-09 ENCOUNTER — Other Ambulatory Visit: Payer: Self-pay | Admitting: Cardiovascular Disease

## 2019-11-09 ENCOUNTER — Ambulatory Visit (HOSPITAL_COMMUNITY)
Admission: RE | Admit: 2019-11-09 | Discharge: 2019-11-09 | Disposition: A | Discharge status: Home / Self Care | Payer: Medicare (Managed Care) | Source: Ambulatory Visit | Attending: Cardiovascular Disease | Admitting: Cardiovascular Disease

## 2019-11-09 ENCOUNTER — Ambulatory Visit (HOSPITAL_BASED_OUTPATIENT_CLINIC_OR_DEPARTMENT_OTHER)
Admission: RE | Admit: 2019-11-09 | Discharge: 2019-11-09 | Disposition: A | Discharge status: Home / Self Care | Payer: Medicare (Managed Care) | Source: Ambulatory Visit | Attending: Cardiovascular Disease | Admitting: Cardiovascular Disease

## 2019-11-09 ENCOUNTER — Other Ambulatory Visit: Payer: Self-pay

## 2019-11-09 DIAGNOSIS — I998 Other disorder of circulatory system: Secondary | ICD-10-CM

## 2019-11-09 DIAGNOSIS — I70229 Atherosclerosis of native arteries of extremities with rest pain, unspecified extremity: Secondary | ICD-10-CM

## 2019-11-09 DIAGNOSIS — I6522 Occlusion and stenosis of left carotid artery: Secondary | ICD-10-CM | POA: Insufficient documentation

## 2019-11-10 ENCOUNTER — Other Ambulatory Visit: Payer: Self-pay

## 2019-11-10 DIAGNOSIS — I739 Peripheral vascular disease, unspecified: Secondary | ICD-10-CM

## 2019-11-10 DIAGNOSIS — I6522 Occlusion and stenosis of left carotid artery: Secondary | ICD-10-CM

## 2019-11-10 DIAGNOSIS — I6523 Occlusion and stenosis of bilateral carotid arteries: Secondary | ICD-10-CM

## 2020-06-23 ENCOUNTER — Encounter: Payer: Self-pay | Admitting: Cardiovascular Disease

## 2020-06-23 ENCOUNTER — Ambulatory Visit (INDEPENDENT_AMBULATORY_CARE_PROVIDER_SITE_OTHER): Payer: Medicare (Managed Care) | Admitting: Cardiovascular Disease

## 2020-06-23 ENCOUNTER — Other Ambulatory Visit: Payer: Self-pay

## 2020-06-23 VITALS — BP 130/68 | HR 86 | Ht 73.0 in | Wt 195.0 lb

## 2020-06-23 DIAGNOSIS — E782 Mixed hyperlipidemia: Secondary | ICD-10-CM

## 2020-06-23 DIAGNOSIS — I6523 Occlusion and stenosis of bilateral carotid arteries: Secondary | ICD-10-CM

## 2020-06-23 DIAGNOSIS — Z794 Long term (current) use of insulin: Secondary | ICD-10-CM | POA: Diagnosis not present

## 2020-06-23 DIAGNOSIS — E1140 Type 2 diabetes mellitus with diabetic neuropathy, unspecified: Secondary | ICD-10-CM

## 2020-06-23 DIAGNOSIS — Z716 Tobacco abuse counseling: Secondary | ICD-10-CM | POA: Diagnosis not present

## 2020-06-23 NOTE — Assessment & Plan Note (Signed)
History of critical limb ischemia status post right BKA by Dr. Lajoyce Corners in 2005.  Because of a stump ulcer I angiogram him 05/05/2017 revealing an occluded right SFA which I was able to revascularize using atherectomy and drug-coated balloon angioplasty.  He had continued infection and I ultimately restudied him 10/19/2018 revealing occluded right SFA which I was able to repeat vascularize.  He ultimately had a right above-the-knee amputation which ultimately healed.  His most recent Doppler studies performed 11/09/2019 revealed an occluded right SFA with a moderate stenosis in the left popliteal artery.  He does get around with a Hoveround.

## 2020-06-23 NOTE — Assessment & Plan Note (Signed)
History of hyperlipidemia on high-dose statin therapy with lipid profile performed 06/09/2019 revealing total cholesterol 86, LDL 36 and HDL 31.

## 2020-06-23 NOTE — Assessment & Plan Note (Signed)
History of moderate bilateral ICA stenosis by duplex ultrasound 11/09/2019.  This will be repeated on an annual basis.

## 2020-06-23 NOTE — Assessment & Plan Note (Signed)
Long history of tobacco use having ultimately discontinued.

## 2020-06-23 NOTE — Progress Notes (Signed)
06/23/2020 Brian Zavala   [birth year]  [id number]  [facility] Primary Cardiologist: [name] MD FACP, [facility], [place], [place]  HPI:  Brian Zavala is a 70 y.o.  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 He was referred by Dr. Lajoyce Corners for peripheral vascular evaluation because of critical limb ischemia.I last saw him in the office 06/29/2019. He has a history of 75-pack-years of tobacco abuse currently smoking 1-1/2 packs a day as well as 2 hypertension, diabetes and hyperlipidemia. He has never had a heart attack or stroke. 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 In June of last year he developed leg stump ulcer which has not healed. Dopplers performed in our office yesterday showed an occluded right SFA.. I performed angiography on him 05/05/17 revealing an occluded right SFA from the origin down the adductor canal which I was able to revascularize using directional atherectomy with drug-eluting balloon angioplasty. Did have an occluded popliteal as well. Follow-up lower extremity arterial Doppler studies performed 05/14/17 showed marked improvement. He did also stop smoking at that time.  Hehad begun hyperbaric therapy and his right stump ulcer has completely healedafter 6 months. He did start smoking again. Unfortunately developed a recurrent ulcer several months ago and had repeat surgery by Dr. Lajoyce Corners 09/09/2018 with revision of his stump. Unfortunately, the wound has been slow to heal. Follow-up Dopplers performed today revealed occluded right SFA.  I performed peripheral angiography on him 10/19/2018 revealing occluded right SFA which I was able to revascularized using atherectomy and drug-coated balloon angioplasty. Unfortunate there was slow flow although follow-up Doppler studies performed 11/04/2018 revealed his SFA to be widely patent. He continues to have  infection in his stump with severe pain and I suspect will ultimately require right above-the-knee amputation. He did stop smoking on August 1 however.  Since I saw him in the office a year ago he continues to do well.  His right AKA stump is healed.  He gets around with a Hoveround.  He has stopped smoking.  He denies chest pain or shortness of breath.   Current Meds  Medication Sig  . aspirin 81 MG chewable tablet Chew 81 mg by mouth daily.  Marland Kitchen atorvastatin (LIPITOR) 80 MG tablet Take 1 tablet (80 mg total) by mouth daily at 6 PM.  . calcium carbonate (OSCAL) 1500 (600 Ca) MG TABS tablet Take 600 mg of elemental calcium by mouth daily with breakfast.   . cholecalciferol (VITAMIN D) 1000 UNITS tablet Take 1,000 Units by mouth daily.  . clopidogrel (PLAVIX) 75 MG tablet Take 1 tablet (75 mg total) by mouth daily with breakfast.  . docusate sodium (COLACE) 100 MG capsule Take 100 mg by mouth 2 (two) times daily.  . furosemide (LASIX) 20 MG tablet Take 20 mg by mouth daily.  . hydrochlorothiazide (MICROZIDE) 12.5 MG capsule Take 12.5 mg by mouth daily.  . Insulin Glargine, 1 Unit Dial, (TOUJEO SOLOSTAR) 300 UNIT/ML SOPN Inject 40 Units into the skin daily with lunch. (Patient taking differently: Inject 80 Units into the skin daily with lunch.)  . leflunomide (ARAVA) 20 MG tablet Take 20 mg by mouth daily.  . Liraglutide (VICTOZA) 18 MG/3ML SOLN injection Inject 1.8 mg into the skin every evening.   . metFORMIN (GLUCOPHAGE) 1000 MG tablet Take 1,000 mg by mouth 2 (two) times daily.  . metoprolol tartrate (LOPRESSOR) 25  MG tablet Take 0.5 tablets (12.5 mg total) by mouth 2 (two) times daily.  Marland Kitchen morphine (MS CONTIN) 100 MG 12 hr tablet Take 100 mg by mouth every 12 (twelve) hours.  Marland Kitchen oxymetazoline (AFRIN) 0.05 % nasal spray Place 1 spray into both nostrils 2 (two) times daily as needed for congestion.  . predniSONE (DELTASONE) 1 MG tablet Take 2 mg by mouth daily with breakfast.  . pregabalin  (LYRICA) 75 MG capsule Take 75 mg by mouth See admin instructions. Take 75 mg by mouth in the morning and 150 mg in the afternoon  . Probiotic Product (PROBIOTIC DAILY PO) Take 1 capsule by mouth daily.  . vitamin B-12 (CYANOCOBALAMIN) 100 MCG tablet Take 100 mcg by mouth daily.     Allergies  Allergen Reactions  . Varenicline Nausea Only    Social History   Socioeconomic History  . Marital status: Divorced    Spouse name: Not on file  . Number of children: Not on file  . Years of education: Not on file  . Highest education level: Not on file  Occupational History  . Not on file  Tobacco Use  . Smoking status: Former Smoker    Packs/day: 1.00    Years: 47.00    Pack years: 47.00    Types: Cigarettes    Quit date: 10/03/2018    Years since quitting: 1.7  . Smokeless tobacco: Never Used  Vaping Use  . Vaping Use: Never used  Substance and Sexual Activity  . Alcohol use: No  . Drug use: No  . Sexual activity: Not Currently  Other Topics Concern  . Not on file  Social History Narrative  . Not on file   Social Determinants of Health   Financial Resource Strain: Not on file  Food Insecurity: Not on file  Transportation Needs: Not on file  Physical Activity: Not on file  Stress: Not on file  Social Connections: Not on file  Intimate Partner Violence: Not on file     Review of Systems: General: negative for chills, fever, night sweats or weight changes.  Cardiovascular: negative for chest pain, dyspnea on exertion, edema, orthopnea, palpitations, paroxysmal nocturnal dyspnea or shortness of breath Dermatological: negative for rash Respiratory: negative for cough or wheezing Urologic: negative for hematuria Abdominal: negative for nausea, vomiting, diarrhea, bright red blood per rectum, melena, or hematemesis Neurologic: negative for visual changes, syncope, or dizziness All other systems reviewed and are otherwise negative except as noted above.    Blood pressure  130/68, pulse 86, height 6\' 1"  (1.854 m), weight 195 lb (88.5 kg).  General appearance: alert and no distress Neck: no adenopathy, no JVD, supple, symmetrical, trachea midline, thyroid not enlarged, symmetric, no tenderness/mass/nodules and Bilateral carotid bruits Lungs: clear to auscultation bilaterally Heart: regular rate and rhythm, S1, S2 normal, no murmur, click, rub or gallop Extremities: extremities normal, atraumatic, no cyanosis or edema Pulses: Right AKA, diminished left pedal pulse Skin: Skin color, texture, turgor normal. No rashes or lesions Neurologic: Alert and oriented X 3, normal strength and tone. Normal symmetric reflexes. Normal coordination and gait  EKG normal sinus rhythm at 86 with borderline voltage for LVH, septal Q waves, left axis deviation nonspecific ST and T wave changes.  Personally reviewed this EKG.  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  Because of a stump ulcer I angiogram him 05/05/2017 revealing an occluded right SFA which  I was able to revascularize using atherectomy and drug-coated balloon angioplasty.  He had continued infection and I ultimately restudied him 10/19/2018 revealing occluded right SFA which I was able to repeat vascularize.  He ultimately had a right above-the-knee amputation which ultimately healed.  His most recent Doppler studies performed 11/09/2019 revealed an occluded right SFA with a moderate stenosis in the left popliteal artery.  He does get around with a Hoveround.  Tobacco abuse counseling Long history of tobacco use having ultimately discontinued.  Hyperlipidemia History of hyperlipidemia on high-dose statin therapy with lipid profile performed 06/09/2019 revealing total cholesterol 86, LDL 36 and HDL 31.  Carotid artery disease (HCC) History of moderate bilateral ICA stenosis by duplex ultrasound 11/09/2019.  This will be repeated on an annual  basis.      [name] MD FACP,FACC,FAHA, [facility] 06/23/2020 10:06 AM

## 2020-06-23 NOTE — Patient Instructions (Addendum)
Medication Instructions:  Your physician recommends that you continue on your current medications as directed. Please refer to the Current Medication list given to you today.  *If you need a refill on your cardiac medications before your next appointment, please call your pharmacy*   Testing/Procedures: Your physician has requested that you have a lower extremity arterial duplex. This test is an ultrasound of the arteries in the legs. It looks at arterial blood flow in the legs. Allow one hour for Lower Arterial scans. There are no restrictions or special instructions  Your physician has requested that you have an ankle brachial index (ABI). During this test an ultrasound and blood pressure cuff are used to evaluate the arteries that supply the arms and legs with blood. Allow thirty minutes for this exam. There are no restrictions or special instructions.  Your physician has requested that you have a carotid duplex. This test is an ultrasound of the carotid arteries in your neck. It looks at blood flow through these arteries that supply the brain with blood. Allow one hour for this exam. There are no restrictions or special instructions.  These procedures are done at [address] [facility]. 2nd Floor. To be done in Sept 2022.  Follow-Up: At [facility], you and your health needs are our priority.  As part of our continuing mission to provide you with exceptional heart care, we have created designated Provider Care Teams.  These Care Teams include your primary Cardiologist (physician) and Advanced Practice Providers (APPs -  Physician Assistants and Nurse Practitioners) who all work together to provide you with the care you need, when you need it.  We recommend signing up for the patient portal called "MyChart".  Sign up information is provided on this After Visit Summary.  MyChart is used to connect with patients for Virtual Visits (Telemedicine).  Patients are able to view lab/test results,  encounter notes, upcoming appointments, etc.  Non-urgent messages can be sent to your provider as well.   To learn more about what you can do with MyChart, go to [email or web address].    Your next appointment:   12 month(s)  The format for your next appointment:   In Person  Provider:   Nanetta Batty, MD

## 2020-08-17 ENCOUNTER — Telehealth: Payer: Self-pay | Admitting: Cardiovascular Disease

## 2020-08-17 NOTE — Telephone Encounter (Signed)
Jola Babinski would like to know if there is a separate order needed for an ultrasound or is this inclusive  with both appointments pt is sch for

## 2020-08-17 NOTE — Telephone Encounter (Signed)
Spoke to [place] with [facility] she was calling to verify patient's doppler appointments.Advised patient is scheduled on 11/07/20 at 9:00 am lower ext dopplers and at 11:00 am carotid dopplers.

## 2020-11-07 ENCOUNTER — Ambulatory Visit (HOSPITAL_BASED_OUTPATIENT_CLINIC_OR_DEPARTMENT_OTHER)
Admission: RE | Admit: 2020-11-07 | Discharge: 2020-11-07 | Disposition: A | Discharge status: Home / Self Care | Payer: Medicare (Managed Care) | Source: Ambulatory Visit | Attending: Cardiovascular Disease | Admitting: Cardiovascular Disease

## 2020-11-07 ENCOUNTER — Other Ambulatory Visit (HOSPITAL_COMMUNITY): Payer: Self-pay | Admitting: Cardiovascular Disease

## 2020-11-07 ENCOUNTER — Other Ambulatory Visit: Payer: Self-pay

## 2020-11-07 ENCOUNTER — Ambulatory Visit (HOSPITAL_COMMUNITY)
Admission: RE | Admit: 2020-11-07 | Discharge: 2020-11-07 | Disposition: A | Discharge status: Home / Self Care | Payer: Medicare (Managed Care) | Source: Ambulatory Visit | Attending: Cardiovascular Disease | Admitting: Cardiovascular Disease

## 2020-11-07 DIAGNOSIS — I6522 Occlusion and stenosis of left carotid artery: Secondary | ICD-10-CM | POA: Insufficient documentation

## 2020-11-07 DIAGNOSIS — I739 Peripheral vascular disease, unspecified: Secondary | ICD-10-CM

## 2020-11-07 DIAGNOSIS — I771 Stricture of artery: Secondary | ICD-10-CM

## 2020-11-07 DIAGNOSIS — I6523 Occlusion and stenosis of bilateral carotid arteries: Secondary | ICD-10-CM

## 2021-02-14 ENCOUNTER — Telehealth: Payer: Self-pay | Admitting: Cardiovascular Disease

## 2021-02-14 NOTE — Telephone Encounter (Signed)
Tonye Becket, NP, from [facility] calling to discuss whether the patient should be seen sooner. She says they were unsuccessful with his dopplers and she is concerned there is a progression of his PVD. Phone: [phone]

## 2021-02-14 NOTE — Telephone Encounter (Signed)
Spoke to Tylene Fantasia NP from [facility].Stated she saw patient today and had trouble getting patient's pedal pulses.Stated patient has severe neuropathy and has no feeling in both lower legs.She requested a sooner appointment with Dr.Berry.Appointment scheduled with Dr.Berry 03/14/21 at 11:30 am.Advised I will make Dr.Berry aware.

## 2021-02-19 ENCOUNTER — Other Ambulatory Visit: Payer: Self-pay | Admitting: Vascular Surgery

## 2021-02-19 DIAGNOSIS — L89151 Pressure ulcer of sacral region, stage 1: Secondary | ICD-10-CM

## 2021-02-19 DIAGNOSIS — Z0000 Encounter for general adult medical examination without abnormal findings: Secondary | ICD-10-CM

## 2021-02-19 DIAGNOSIS — Z7982 Long term (current) use of aspirin: Secondary | ICD-10-CM

## 2021-03-14 ENCOUNTER — Ambulatory Visit (INDEPENDENT_AMBULATORY_CARE_PROVIDER_SITE_OTHER): Payer: Medicare (Managed Care) | Admitting: Cardiovascular Disease

## 2021-03-14 ENCOUNTER — Other Ambulatory Visit: Payer: Self-pay

## 2021-03-14 ENCOUNTER — Encounter: Payer: Self-pay | Admitting: Cardiovascular Disease

## 2021-03-14 DIAGNOSIS — I6523 Occlusion and stenosis of bilateral carotid arteries: Secondary | ICD-10-CM | POA: Diagnosis not present

## 2021-03-14 DIAGNOSIS — E782 Mixed hyperlipidemia: Secondary | ICD-10-CM | POA: Diagnosis not present

## 2021-03-14 DIAGNOSIS — I70229 Atherosclerosis of native arteries of extremities with rest pain, unspecified extremity: Secondary | ICD-10-CM

## 2021-03-14 DIAGNOSIS — Z716 Tobacco abuse counseling: Secondary | ICD-10-CM | POA: Diagnosis not present

## 2021-03-14 NOTE — Progress Notes (Addendum)
04/03/2021 Brian Zavala   25-Jan-1951  [id number]  [place], [place] Primary Cardiologist: Lorretta Harp MD FACP, [facility], [place], [place]  HPI:  Brian Zavala is a 71 y.o.  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 He was referred by Dr. Sharol Given for peripheral vascular evaluation because of critical limb ischemia.  I last saw him in the office 06/23/2020.  He has a history of 75-pack-years of tobacco abuse currently smoking 1-1/2 packs a day as well as 2 hypertension, diabetes and hyperlipidemia. He has never had a heart attack or stroke. 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 In June of last year he developed leg stump ulcer which has not healed. Dopplers performed in our office yesterday showed an occluded right SFA.. I performed angiography on him 05/05/17 revealing an occluded right SFA from the origin down the adductor canal which I was able to revascularize using directional atherectomy with drug-eluting balloon angioplasty. Did have an occluded popliteal as well. Follow-up lower extremity arterial Doppler studies performed 05/14/17 showed marked improvement. He did also stop smoking at that time.    He had begun hyperbaric therapy and his right stump ulcer has completely healed after 6 months.  He did start smoking again.  Unfortunately developed a recurrent ulcer several months ago and had repeat surgery by Dr. Sharol Given 09/09/2018 with revision of his stump.  Unfortunately, the wound has been slow to heal.  Follow-up Dopplers performed today revealed occluded right SFA.   I performed peripheral angiography on him 10/19/2018 revealing occluded right SFA which I was able to revascularized using atherectomy and drug-coated balloon angioplasty.  Unfortunate there was slow flow although follow-up Doppler studies performed 11/04/2018 revealed his SFA to be widely patent.  He continues to have  infection in his stump with severe pain and I suspect will ultimately require right above-the-knee amputation.  He did stop smoking on August 1 however.   Since I saw him in the office 9 months ago he continues to do well.  His above-the-knee amputation has healed well.  He does continue to smoke a pack and a half a day unfortunately.  His lower extremity arterial Doppler studies performed 11/07/2020 revealed an occluded right SFA.  He is nonambulatory and wheelchair-bound.  He denies chest pain or shortness of breath.     Current Meds  Medication Sig   aspirin 81 MG chewable tablet Chew 81 mg by mouth daily.   atorvastatin (LIPITOR) 80 MG tablet Take 1 tablet (80 mg total) by mouth daily at 6 PM.   calcium carbonate (OSCAL) 1500 (600 Ca) MG TABS tablet Take 600 mg of elemental calcium by mouth daily with breakfast.    cholecalciferol (VITAMIN D) 1000 UNITS tablet Take 1,000 Units by mouth daily.   clopidogrel (PLAVIX) 75 MG tablet Take 1 tablet (75 mg total) by mouth daily with breakfast.   docusate sodium (COLACE) 100 MG capsule Take 100 mg by mouth 2 (two) times daily.   furosemide (LASIX) 20 MG tablet Take 20 mg by mouth daily.   hydrochlorothiazide (MICROZIDE) 12.5 MG capsule Take 12.5 mg by mouth daily.   Insulin Glargine, 1 Unit Dial, (TOUJEO SOLOSTAR) 300 UNIT/ML SOPN Inject 40 Units into the skin daily with Zavala. (Patient taking differently: Inject 80 Units into the skin daily with Zavala.)   leflunomide (ARAVA) 20 MG tablet Take 20 mg by mouth  daily.   Liraglutide (VICTOZA) 18 MG/3ML SOLN injection Inject 1.8 mg into the skin every evening.    metFORMIN (GLUCOPHAGE) 1000 MG tablet Take 1,000 mg by mouth 2 (two) times daily.   morphine (MS CONTIN) 100 MG 12 hr tablet Take 100 mg by mouth every 12 (twelve) hours.   oxymetazoline (AFRIN) 0.05 % nasal spray Place 1 spray into both nostrils 2 (two) times daily as needed for congestion.   predniSONE (DELTASONE) 1 MG tablet Take 2 mg by mouth  daily with breakfast.   pregabalin (LYRICA) 75 MG capsule Take 75 mg by mouth See admin instructions. Take 75 mg by mouth in the morning and 150 mg in the afternoon   vitamin B-12 (CYANOCOBALAMIN) 100 MCG tablet Take 100 mcg by mouth daily.     Allergies  Allergen Reactions   Varenicline Nausea Only    Social History   Socioeconomic History   Marital status: Divorced    Spouse name: Not on file   Number of children: Not on file   Years of education: Not on file   Highest education level: Not on file  Occupational History   Not on file  Tobacco Use   Smoking status: Every Day    Packs/day: 1.00    Years: 47.00    Pack years: 47.00    Types: Cigarettes    Start date: 03/04/1965   Smokeless tobacco: Never  Vaping Use   Vaping Use: Never used  Substance and Sexual Activity   Alcohol use: No   Drug use: No   Sexual activity: Not Currently  Other Topics Concern   Not on file  Social History Narrative   Not on file   Social Determinants of Health   Financial Resource Strain: Not on file  Food Insecurity: Not on file  Transportation Needs: Not on file  Physical Activity: Not on file  Stress: Not on file  Social Connections: Not on file  Intimate Partner Violence: Not on file     Review of Systems: General: negative for chills, fever, night sweats or weight changes.  Cardiovascular: negative for chest pain, dyspnea on exertion, edema, orthopnea, palpitations, paroxysmal nocturnal dyspnea or shortness of breath Dermatological: negative for rash Respiratory: negative for cough or wheezing Urologic: negative for hematuria Abdominal: negative for nausea, vomiting, diarrhea, bright red blood per rectum, melena, or hematemesis Neurologic: negative for visual changes, syncope, or dizziness All other systems reviewed and are otherwise negative except as noted above.    Blood pressure (!) 110/58, pulse 84, resp. rate 20, height 6' (1.829 m), weight 161 lb (73 kg), SpO2 96 %.   General appearance: alert and no distress Neck: no adenopathy, no JVD, supple, symmetrical, trachea midline, thyroid not enlarged, symmetric, no tenderness/mass/nodules, and bilateral carotid bruits left lateral the right Lungs: clear to auscultation bilaterally Heart: regular rate and rhythm, S1, S2 normal, no murmur, click, rub or gallop Extremities: Status post right AKA Pulses: Absent left pedal pulse Skin: Skin color, texture, turgor normal. No rashes or lesions Neurologic: Grossly normal  EKG sinus rhythm at 84 with septal Q waves, left axis deviation.  I personally reviewed this EKG.  ASSESSMENT AND PLAN:   Critical lower limb ischemia History of critical limb ischemia status post right SFA intervention twice by myself 05/05/2017 and again 10/19/2018.  He originally had a right BKA with revision of his stump ultimately requiring an AKA which has since healed.  He is wheelchair-bound.  His most recent lower extremity arterial Doppler study  performed 11/07/2020 revealed an occluded right SFA and high-grade left popliteal artery stenosis.  Tobacco abuse counseling Ongoing tobacco abuse of 1-1/2 packs/day recalcitrant to risk factor modification.  Hyperlipidemia History of hyperlipidemia on high-dose statin therapy.  His most recent lipid profile performed 05/15/2020 revealed a total cholesterol of 74, LDL 29 and HDL 31.  Carotid artery disease ([place]) History of moderate bilateral ICA stenosis by duplex ultrasound performed 11/07/2020.  He has a loud left carotid bruit and a softer right.  We will repeat this on an annual basis.     Lorretta Harp MD FACP,FACC,FAHA, [facility] 04/03/2021 4:20 PM

## 2021-03-14 NOTE — Assessment & Plan Note (Signed)
History of critical limb ischemia status post right SFA intervention twice by myself 05/05/2017 and again 10/19/2018.  He originally had a right BKA with revision of his stump ultimately requiring an AKA which has since healed.  He is wheelchair-bound.  His most recent lower extremity arterial Doppler study performed 11/07/2020 revealed an occluded right SFA and high-grade left popliteal artery stenosis.

## 2021-03-14 NOTE — Assessment & Plan Note (Signed)
Ongoing tobacco abuse of 1-1/2 packs/day recalcitrant to risk factor modification. 

## 2021-03-14 NOTE — Patient Instructions (Signed)
Medication Instructions:  Your physician recommends that you continue on your current medications as directed. Please refer to the Current Medication list given to you today.  *If you need a refill on your cardiac medications before your next appointment, please call your pharmacy*   Lab Work: Your physician recommends that you return for lab work in: next week or 2 for FASTING lipid/liver profile.  If you have labs (blood work) drawn today and your tests are completely normal, you will receive your results only by: MyChart Message (if you have MyChart) OR A paper copy in the mail If you have any lab test that is abnormal or we need to change your treatment, we will call you to review the results.   Testing/Procedures: Your physician has requested that you have a carotid duplex. This test is an ultrasound of the carotid arteries in your neck. It looks at blood flow through these arteries that supply the brain with blood. Allow one hour for this exam. There are no restrictions or special instructions. To be done in Sept 2023. This procedure is done at [facility] [facility].   Follow-Up: At [facility], you and your health needs are our priority.  As part of our continuing mission to provide you with exceptional heart care, we have created designated Provider Care Teams.  These Care Teams include your primary Cardiologist (physician) and Advanced Practice Providers (APPs -  Physician Assistants and Nurse Practitioners) who all work together to provide you with the care you need, when you need it.  We recommend signing up for the patient portal called "MyChart".  Sign up information is provided on this After Visit Summary.  MyChart is used to connect with patients for Virtual Visits (Telemedicine).  Patients are able to view lab/test results, encounter notes, upcoming appointments, etc.  Non-urgent messages can be sent to your provider as well.   To learn more about what you can do with MyChart,  go to [email or web address].    Your next appointment:   12 month(s)  The format for your next appointment:   In Person  Provider:   Nanetta Batty, MD

## 2021-03-14 NOTE — Assessment & Plan Note (Signed)
History of moderate bilateral ICA stenosis by duplex ultrasound performed 11/07/2020.  He has a loud left carotid bruit and a softer right.  We will repeat this on an annual basis.

## 2021-03-14 NOTE — Assessment & Plan Note (Addendum)
History of hyperlipidemia on high-dose statin therapy.  His most recent lipid profile performed 05/15/2020 revealed a total cholesterol of 74, LDL 29 and HDL 31.

## 2021-03-15 ENCOUNTER — Ambulatory Visit
Admission: RE | Admit: 2021-03-15 | Discharge: 2021-03-15 | Disposition: A | Discharge status: Home / Self Care | Payer: Medicare (Managed Care) | Source: Ambulatory Visit | Attending: Vascular Surgery | Admitting: Vascular Surgery

## 2021-03-15 DIAGNOSIS — L89151 Pressure ulcer of sacral region, stage 1: Secondary | ICD-10-CM

## 2021-03-15 DIAGNOSIS — Z0000 Encounter for general adult medical examination without abnormal findings: Secondary | ICD-10-CM

## 2021-03-15 DIAGNOSIS — Z7982 Long term (current) use of aspirin: Secondary | ICD-10-CM

## 2021-03-21 ENCOUNTER — Telehealth: Payer: Self-pay | Admitting: Cardiovascular Disease

## 2021-03-21 NOTE — Telephone Encounter (Signed)
Returned call to Tonye Becket, NP   [facility] she states that she will have these labs drawn with hers and have the results faxed here.

## 2021-03-21 NOTE — Telephone Encounter (Signed)
Tonye Becket, NP with [facility], is calling regarding lab orders. She states patient had lipids drawn with them in 05/2020 and a CMP on 02/14/21. [place] plans to fax the lab report to the office. She would like to know whether patient will still need additional labs drawn this month. If not, she states the patient does have repeat labs scheduled for March. Please advise.

## 2021-05-30 ENCOUNTER — Ambulatory Visit: Payer: Medicare (Managed Care) | Admitting: Cardiovascular Disease

## 2021-08-03 ENCOUNTER — Telehealth: Payer: Self-pay | Admitting: Cardiovascular Disease

## 2021-08-03 NOTE — Telephone Encounter (Signed)
Spoke with Tonye BecketBrittany Miller, NP- she saw patient with [facility], and they were able to get dopplers on him today. He is not showing any signs of lower leg concerns at this time. She just wanted to make sure patient would be due for lower extremity and ABIs in September. Advised I did see the result note to have him scheduled for follow up in 12 months (which is due for September) advised we could go ahead and place the orders if okay.   Thanks!

## 2021-08-03 NOTE — Telephone Encounter (Signed)
PCP is calling to discuss a planfor the patient. and he needs an ABI done. Please advise

## 2021-08-07 ENCOUNTER — Other Ambulatory Visit: Payer: Self-pay | Admitting: Vascular Surgery

## 2021-08-09 ENCOUNTER — Other Ambulatory Visit: Payer: Self-pay | Admitting: Vascular Surgery

## 2021-11-01 ENCOUNTER — Ambulatory Visit (INDEPENDENT_AMBULATORY_CARE_PROVIDER_SITE_OTHER): Payer: Medicare (Managed Care) | Admitting: Orthopedic Surgery

## 2021-11-01 ENCOUNTER — Ambulatory Visit (INDEPENDENT_AMBULATORY_CARE_PROVIDER_SITE_OTHER): Payer: Medicare (Managed Care)

## 2021-11-01 ENCOUNTER — Encounter: Payer: Self-pay | Admitting: Orthopedic Surgery

## 2021-11-01 DIAGNOSIS — M25522 Pain in left elbow: Secondary | ICD-10-CM

## 2021-11-01 DIAGNOSIS — M7022 Olecranon bursitis, left elbow: Secondary | ICD-10-CM | POA: Diagnosis not present

## 2021-11-01 NOTE — Progress Notes (Signed)
Office Visit Note   Patient: Brian Zavala           Date of Birth: 12/08/1950           MRN: [id number] Visit Date: 11/01/2021              Requested by: [facility], [place] [facility] [address] [place],  [place] [address] PCP: [facility] [place] [facility]  Chief Complaint  Patient presents with   Left Elbow - Follow-up      HPI: Patient is a 71 year old gentleman who states he has had several year history of off-and-on drainage from his olecranon bursa left elbow.  Patient states he has completed several courses of antibiotics denies any recent trauma.  Assessment & Plan: Visit Diagnoses:  1. Pain in left elbow   2. Olecranon bursitis, left elbow     Plan: Recommended elbow pads and consciously trying to keep pressure off his elbow.  He does ambulate in a motorized wheelchair.  Discussed that there are risks with olecranon bursa resection and we will continue to follow this expectantly.  Follow-Up Instructions: Return if symptoms worsen or fail to improve.   Ortho Exam  Patient is alert, oriented, no adenopathy, well-dressed, normal affect, normal respiratory effort. Examination there is no swelling or cellulitis of the olecranon bursa.  There is a small ulcer which is superficial 2 mm in diameter.  There is no tenderness to palpation he has good range of motion of his elbow.  Imaging: XR Elbow 2 Views Left  Result Date: 11/01/2021 2 view radiographs of the left ankle shows degenerative arthritis no destructive bony changes.  No images are attached to the encounter.  Labs: Lab Results  Component Value Date   HGBA1C 9.0 (H) 11/27/2018   HGBA1C 7.6 (H) 08/21/2018   HGBA1C 8.0 (H) 04/19/2018   ESRSEDRATE 46 (H) 04/18/2018   ESRSEDRATE 2 12/04/2017   CRP 1.4 (H) 04/21/2018   CRP 6.8 (H) 04/18/2018   CRP 3.1 12/04/2017   REPTSTATUS 09/18/2018 FINAL 09/09/2018   GRAMSTAIN  09/09/2018    ABUNDANT WBC PRESENT,BOTH PMN AND  MONONUCLEAR FEW GRAM POSITIVE COCCI Performed at [facility], [address] [place], [place] [address]    CULT  09/09/2018    MODERATE STAPHYLOCOCCUS AUREUS FEW PEPTOSTREPTOCOCCUS SPECIES    LABORGA STAPHYLOCOCCUS AUREUS 09/09/2018     Lab Results  Component Value Date   ALBUMIN 3.9 06/29/2019   ALBUMIN 3.0 (L) 04/19/2018   ALBUMIN 3.3 (L) 04/18/2018    Lab Results  Component Value Date   MG 1.8 12/03/2014   No results found for: "VD25OH"  No results found for: "PREALBUMIN"    Latest Ref Rng & Units 11/27/2018    9:31 AM 10/20/2018    3:55 AM 09/09/2018   10:47 AM  CBC EXTENDED  WBC 4.0 - 10.5 K/uL 9.0  8.4  9.8   RBC 4.22 - 5.81 MIL/uL 4.16  4.87  4.76   Hemoglobin 13.0 - 17.0 g/dL 82.9  56.2  13.0   HCT 39.0 - 52.0 % 34.7  41.7  40.9   Platelets 150 - 400 K/uL 408  300  349      There is no height or weight on file to calculate BMI.  Orders:  Orders Placed This Encounter  Procedures   XR Elbow 2 Views Left   No orders of the defined types were placed in this encounter.    Procedures: No  procedures performed  Clinical Data: No additional findings.  ROS:  All other systems negative, except as noted in the HPI. Review of Systems  Objective: Vital Signs: There were no vitals taken for this visit.  Specialty Comments:  No specialty comments available.  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) 11/27/2018   Hyperlipidemia 11/25/2018   Carotid artery disease (HCC) 11/25/2018   Critical limb ischemia with history of revascularization of same extremity (HCC) 10/19/2018   Below-knee amputation of right lower extremity (HCC) 09/09/2018   Dehiscence of amputation stump (HCC) 08/18/2018   H/O amputation of lesser toe, left (HCC) 05/06/2018   Peripheral vascular disease (HCC) 04/19/2018   Diabetic foot infection (HCC) 04/18/2018   Tobacco abuse counseling    Critical lower  limb ischemia (HCC) 05/02/2017   Subacute osteomyelitis of right fibula (HCC)    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, right, sequela (HCC) 02/03/2017   Diabetes mellitus, insulin dependent (IDDM), controlled    Type 2 diabetes mellitus with diabetic neuropathy, with long-term current use of insulin (HCC)    S/P unilateral BKA (below knee amputation) (HCC)    Rheumatoid arthritis (HCC)    Gallstones 09/23/2013   Non-intractable cyclical vomiting with nausea 09/23/2013   Epidermal inclusion cyst 11/30/2012   Past Medical History:  Diagnosis Date   Arthritis    "pretty much all over"   Cellulitis of left foot    COPD (chronic obstructive pulmonary disease) (HCC)    Dyspnea    Hyperlipidemia    Hypertension    Neuromuscular disorder (HCC)    neuropathy legs   Neuropathy    legs   Osteomyelitis (HCC) 04/2018   4th toe left foot   Osteomyelitis of fourth toe of left foot (HCC) 04/19/2018   Peripheral vascular disease (HCC)    Type II diabetes mellitus (HCC)    Wears glasses    Wears partial dentures    top and bottom partials    Family History  Problem Relation Age of Onset   Cirrhosis Father     Past Surgical History:  Procedure Laterality Date   ABDOMINAL AORTOGRAM W/LOWER EXTREMITY Bilateral 10/19/2018   Procedure: ABDOMINAL AORTOGRAM W/LOWER EXTREMITY;  Surgeon: [name], MD;  [facility];  Service: Cardiovascular;  Laterality: Bilateral;   ABOVE KNEE LEG AMPUTATION Right 11/27/2018   AMPUTATION Left 04/22/2018   Procedure: LEFT FOOT 4TH RAY AMPUTATION;  Surgeon: [name], MD;  Location: [facility] OR;  Service: Orthopedics;  Laterality: Left;   AMPUTATION Right 11/27/2018   Procedure: RIGHT ABOVE KNEE AMPUTATION;  Surgeon: [name], MD;  [facility];  Service: Orthopedics;  Laterality: Right;   BACK SURGERY     CHOLECYSTECTOMY N/A 10/26/2013   Procedure: LAPAROSCOPIC CHOLECYSTECTOMY WITH INTRAOPERATIVE CHOLANGIOGRAM;  Surgeon:  Clovis Pu. Cornett, MD;  [facility]: [facility];  Service: General;  Laterality: N/A;   COLONOSCOPY     CYST EXCISION Bilateral 02/15/2015   Procedure: LEFT INDEX FINGER AND RIGHT MIDDLE FINGER NODULE EXCISION;  Surgeon: Tarry Kos, MD;  Location: [facility];  Service: Orthopedics;  Laterality: Bilateral;   CYST EXCISION PERINEAL N/A 12/08/2012   Procedure: CYST EXCISION PERINeum;  Surgeon: Maisie Fus A. Cornett, MD;  Location: [facility];  Service: General;  Laterality: N/A;   FOOT AMPUTATION Right 2005   I & D EXTREMITY Right 03/07/2017   Procedure: EXCISION FIBULAR HEAD  RIGHT BELOW KNEE AMPUTATION;  Surgeon: [name], MD;  Location: [facility] OR;  Service: Orthopedics;  Laterality: Right;   INCISE AND DRAIN ABCESS  12/02/2014   PERINEAL ABSCESS   INCISION AND DRAINAGE PERIRECTAL ABSCESS Left 12/02/2014   Procedure: IRRIGATION AND DEBRIDEMENT PERINEAL ABSCESS;  Surgeon: Abigail Miyamoto, MD;  [facility];  Service: General;  Laterality: Left;   LEG AMPUTATION BELOW KNEE Right 2005   LOWER EXTREMITY ANGIOGRAPHY N/A 05/05/2017   Procedure: LOWER EXTREMITY ANGIOGRAPHY;  Surgeon: [name], MD;  [facility];  Service: Cardiovascular;  Laterality: N/A;   LUMBAR DISC SURGERY  82,90   ruptured disc   PERIPHERAL VASCULAR INTERVENTION Right 05/05/2017   Procedure: PERIPHERAL VASCULAR INTERVENTION;  Surgeon: [name], MD;  [facility];  Service: Cardiovascular;  Laterality: Right;   PERIPHERAL VASCULAR INTERVENTION Right 10/19/2018   Procedure: PERIPHERAL VASCULAR INTERVENTION;  Surgeon: [name], MD;  [facility];  Service: Cardiovascular;  Laterality: Right;   SCAR REVISION Right 2005   @ amputation   STUMP REVISION Right 08/21/2018   Procedure: REVISION RIGHT BELOW KNEE AMPUTATION, EXCISION FIBULA;  Surgeon: [name], MD;  [facility];  Service: Orthopedics;  Laterality: Right;   STUMP REVISION  Right 09/09/2018   Procedure: REVISION RIGHT BELOW KNEE AMPUTATION;  Surgeon: [name], MD;  Location: [facility] OR;  Service: Orthopedics;  Laterality: Right;   TOE AMPUTATION Right 2005   Social History   Occupational History   Not on file  Tobacco Use   Smoking status: Every Day    Packs/day: 1.00    Years: 47.00    Total pack years: 47.00    Types: Cigarettes    Start date: 03/04/1965   Smokeless tobacco: Never  Vaping Use   Vaping Use: Never used  Substance and Sexual Activity   Alcohol use: No   Drug use: No   Sexual activity: Not Currently

## 2021-11-07 ENCOUNTER — Ambulatory Visit (HOSPITAL_COMMUNITY)
Admission: RE | Admit: 2021-11-07 | Discharge: 2021-11-07 | Disposition: A | Discharge status: Home / Self Care | Payer: Medicare (Managed Care) | Source: Ambulatory Visit | Attending: Cardiology | Admitting: Cardiology

## 2021-11-07 DIAGNOSIS — I6523 Occlusion and stenosis of bilateral carotid arteries: Secondary | ICD-10-CM | POA: Insufficient documentation

## 2021-11-07 DIAGNOSIS — I771 Stricture of artery: Secondary | ICD-10-CM | POA: Diagnosis not present

## 2021-12-19 ENCOUNTER — Other Ambulatory Visit (HOSPITAL_COMMUNITY): Payer: Self-pay | Admitting: Cardiovascular Disease

## 2021-12-19 DIAGNOSIS — I779 Disorder of arteries and arterioles, unspecified: Secondary | ICD-10-CM

## 2021-12-27 ENCOUNTER — Telehealth: Payer: Self-pay

## 2021-12-27 NOTE — Telephone Encounter (Signed)
Received VM from Clemens Catholic from [facility] requesting 2nd opinion appt for patient with Dr Ninfa Linden

## 2022-01-01 ENCOUNTER — Ambulatory Visit: Payer: Medicare (Managed Care) | Admitting: Orthopedic Surgery

## 2022-01-09 ENCOUNTER — Ambulatory Visit (INDEPENDENT_AMBULATORY_CARE_PROVIDER_SITE_OTHER): Payer: Medicare (Managed Care)

## 2022-01-09 ENCOUNTER — Ambulatory Visit (INDEPENDENT_AMBULATORY_CARE_PROVIDER_SITE_OTHER): Payer: Medicare (Managed Care) | Admitting: Orthopaedic Surgery

## 2022-01-09 DIAGNOSIS — M79604 Pain in right leg: Secondary | ICD-10-CM

## 2022-01-09 DIAGNOSIS — T8789 Other complications of amputation stump: Secondary | ICD-10-CM

## 2022-01-09 NOTE — Progress Notes (Signed)
The patient is a 71 year old gentleman I am seeing as a second opinion as it relates to chronic pain from a right above-knee amputation.  He has peripheral vascular disease and my partner Dr. Lajoyce Corners performed an AKA on the right side in September 2020.  The patient has dealt with significant nerve pain since then.  He did not have any other operations that were needed after that above the amputation.  He is on Lyrica and chronic narcotic pain medications.  He does not use a prosthesis on that side from what I can tell.  He also has chronic wounds over his olecranon bursa on both elbows.  He is a diabetic but reports his blood glucose is under decent control with a hemoglobin A1c in the low 7 range.  I did examine his right AKA stump.  He is very in and cachectic but there is no soft tissue breakdown at the stump itself.  He does have a Tinel's sign over the lateral aspect and just posterior and this could be representing a neuroma.  X-rays of the amputation stump show no evidence of osteomyelitis or cortical irregularities or even calcifications in the soft tissue.  Examination of his left elbow shows a small open wound with draining fluid consistent with chronic olecranon bursitis.  There is no evidence of infection at all and there is no redness and no significant collection of fluid.  This is a chronic wound.  Certainly it may be worth Dr. Lajoyce Corners taking a look at him again in terms of the revision amputation and assessing for a neuroma to see if this could be back up further to decrease his chronic pain.  However there is no guarantee that that would work and the concern would be could heal this type of wound.  I did feel that it is worth Dr. Due to taking a look at this again and requested a follow-up appoint with Dr. Lajoyce Corners.  The patient agrees with this as well.

## 2022-01-29 ENCOUNTER — Ambulatory Visit: Payer: Medicare (Managed Care) | Admitting: Orthopedic Surgery

## 2022-02-07 ENCOUNTER — Encounter: Payer: Self-pay | Admitting: Orthopedic Surgery

## 2022-02-07 ENCOUNTER — Ambulatory Visit (INDEPENDENT_AMBULATORY_CARE_PROVIDER_SITE_OTHER): Payer: Medicare (Managed Care) | Admitting: Orthopedic Surgery

## 2022-02-07 DIAGNOSIS — T8789 Other complications of amputation stump: Secondary | ICD-10-CM | POA: Diagnosis not present

## 2022-02-07 DIAGNOSIS — M5410 Radiculopathy, site unspecified: Secondary | ICD-10-CM

## 2022-02-07 DIAGNOSIS — M79604 Pain in right leg: Secondary | ICD-10-CM

## 2022-02-07 NOTE — Progress Notes (Signed)
Office Visit Note   Patient: Brian Zavala           Date of Birth: August 30, 1950           MRN: [id number] Visit Date: 02/07/2022              Requested by: No referring provider defined for this encounter. PCP: No primary care provider on file.  Chief Complaint  Patient presents with   Right Leg - Pain      HPI: Patient is a 71 year old gentleman who complains of lateral right thigh pain that radiates along the iliotibial band.  Patient states that his bursitis on the elbows has resolved.  Patient states that despite conservative therapy with Lyrica 150 mg twice a day and high doses of oral pain medicine he still has persistent lateral pain radiating on the lateral thigh.  Assessment & Plan: Visit Diagnoses:  1. Pain of amputation stump of right lower extremity (HCC)   2. Radicular pain of right lower extremity     Plan: Will order an MRI scan of his lumbar spine to evaluate for possible impingement.  Patient may benefit from an epidural steroid injection.  Follow-Up Instructions: Return if symptoms worsen or fail to improve.   Ortho Exam  Patient is alert, oriented, no adenopathy, well-dressed, normal affect, normal respiratory effort. Examination patient has pain laterally over the right thigh over the iliotibial band.  The lateral femoral cutaneous nerve does not have any numbness in its distribution.  There is no pain over the posterior aspect of the right thigh no evidence of a neuroma.  There is no tenderness to palpation over the lateral aspect of the thigh his pain seems to be consistent with radicular pain.  There is no swelling no cellulitis.  Imaging: No results found. No images are attached to the encounter.  Labs: Lab Results  Component Value Date   HGBA1C 9.0 (H) 11/27/2018   HGBA1C 7.6 (H) 08/21/2018   HGBA1C 8.0 (H) 04/19/2018   ESRSEDRATE 46 (H) 04/18/2018   ESRSEDRATE 2 12/04/2017   CRP 1.4 (H) 04/21/2018   CRP 6.8 (H) 04/18/2018   CRP 3.1  12/04/2017   REPTSTATUS 09/18/2018 FINAL 09/09/2018   GRAMSTAIN  09/09/2018    ABUNDANT WBC PRESENT,BOTH PMN AND MONONUCLEAR FEW GRAM POSITIVE COCCI Performed at [facility], [address], [place], [place] [address]    CULT  09/09/2018    MODERATE STAPHYLOCOCCUS AUREUS FEW PEPTOSTREPTOCOCCUS SPECIES    LABORGA STAPHYLOCOCCUS AUREUS 09/09/2018     Lab Results  Component Value Date   ALBUMIN 3.9 06/29/2019   ALBUMIN 3.0 (L) 04/19/2018   ALBUMIN 3.3 (L) 04/18/2018    Lab Results  Component Value Date   MG 1.8 12/03/2014   No results found for: "VD25OH"  No results found for: "PREALBUMIN"    Latest Ref Rng & Units 11/27/2018    9:31 AM 10/20/2018    3:55 AM 09/09/2018   10:47 AM  CBC EXTENDED  WBC 4.0 - 10.5 K/uL 9.0  8.4  9.8   RBC 4.22 - 5.81 MIL/uL 4.16  4.87  4.76   Hemoglobin 13.0 - 17.0 g/dL 81.1  57.2  62.0   HCT 39.0 - 52.0 % 34.7  41.7  40.9   Platelets 150 - 400 K/uL 408  300  349      There is no height or weight on file to calculate BMI.  Orders:  No orders of the defined types were placed in  this encounter.  No orders of the defined types were placed in this encounter.    Procedures: No procedures performed  Clinical Data: No additional findings.  ROS:  All other systems negative, except as noted in the HPI. Review of Systems  Objective: Vital Signs: There were no vitals taken for this visit.  Specialty Comments:  No specialty comments available.  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) 11/27/2018   Hyperlipidemia 11/25/2018   Carotid artery disease (HCC) 11/25/2018   Critical limb ischemia with history of revascularization of same extremity (HCC) 10/19/2018   Below-knee amputation of right lower extremity (HCC) 09/09/2018   Dehiscence of amputation stump (HCC) 08/18/2018   H/O amputation of lesser toe, left (HCC) 05/06/2018   Peripheral vascular  disease (HCC) 04/19/2018   Diabetic foot infection (HCC) 04/18/2018   Tobacco abuse counseling    Critical lower limb ischemia (HCC) 05/02/2017   Subacute osteomyelitis of right fibula (HCC)    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, right, sequela (HCC) 02/03/2017   Diabetes mellitus, insulin dependent (IDDM), controlled    Type 2 diabetes mellitus with diabetic neuropathy, with long-term current use of insulin (HCC)    S/P unilateral BKA (below knee amputation) (HCC)    Rheumatoid arthritis (HCC)    Gallstones 09/23/2013   Non-intractable cyclical vomiting with nausea 09/23/2013   Epidermal inclusion cyst 11/30/2012   Past Medical History:  Diagnosis Date   Arthritis    "pretty much all over"   Cellulitis of left foot    COPD (chronic obstructive pulmonary disease) (HCC)    Dyspnea    Hyperlipidemia    Hypertension    Neuromuscular disorder (HCC)    neuropathy legs   Neuropathy    legs   Osteomyelitis (HCC) 04/2018   4th toe left foot   Osteomyelitis of fourth toe of left foot (HCC) 04/19/2018   Peripheral vascular disease (HCC)    Type II diabetes mellitus (HCC)    Wears glasses    Wears partial dentures    top and bottom partials    Family History  Problem Relation Age of Onset   Cirrhosis Father     Past Surgical History:  Procedure Laterality Date   ABDOMINAL AORTOGRAM W/LOWER EXTREMITY Bilateral 10/19/2018   Procedure: ABDOMINAL AORTOGRAM W/LOWER EXTREMITY;  Surgeon: [name], MD;  [facility];  Service: Cardiovascular;  Laterality: Bilateral;   ABOVE KNEE LEG AMPUTATION Right 11/27/2018   AMPUTATION Left 04/22/2018   Procedure: LEFT FOOT 4TH RAY AMPUTATION;  Surgeon: [name], MD;  Location: [facility] OR;  Service: Orthopedics;  Laterality: Left;   AMPUTATION Right 11/27/2018   Procedure: RIGHT ABOVE KNEE AMPUTATION;  Surgeon: [name], MD;  Location: [facility] OR;  Service: Orthopedics;  Laterality: Right;   BACK SURGERY      CHOLECYSTECTOMY N/A 10/26/2013   Procedure: LAPAROSCOPIC CHOLECYSTECTOMY WITH INTRAOPERATIVE CHOLANGIOGRAM;  Surgeon: Clovis Pu. Cornett, MD;  Location[facility];  Service: General;  Laterality: N/A;   COLONOSCOPY     CYST EXCISION Bilateral 02/15/2015   Procedure: LEFT INDEX FINGER AND RIGHT MIDDLE FINGER NODULE EXCISION;  Surgeon: Tarry Kos, MD;  Location: [facility];  Service: Orthopedics;  Laterality: Bilateral;   CYST EXCISION PERINEAL N/A 12/08/2012   Procedure: CYST EXCISION PERINeum;  Surgeon: Maisie Fus A. Cornett, MD;  Location: [facility];  Service: General;  Laterality: N/A;  FOOT AMPUTATION Right 2005   I & D EXTREMITY Right 03/07/2017   Procedure: EXCISION FIBULAR HEAD RIGHT BELOW KNEE AMPUTATION;  Surgeon: [name], MD;  Location: [facility] OR;  Service: Orthopedics;  Laterality: Right;   INCISE AND DRAIN ABCESS  12/02/2014   PERINEAL ABSCESS   INCISION AND DRAINAGE PERIRECTAL ABSCESS Left 12/02/2014   Procedure: IRRIGATION AND DEBRIDEMENT PERINEAL ABSCESS;  Surgeon: Abigail Miyamoto, MD;  [facility];  Service: General;  Laterality: Left;   LEG AMPUTATION BELOW KNEE Right 2005   LOWER EXTREMITY ANGIOGRAPHY N/A 05/05/2017   Procedure: LOWER EXTREMITY ANGIOGRAPHY;  Surgeon: [name], MD;  [facility];  Service: Cardiovascular;  Laterality: N/A;   LUMBAR DISC SURGERY  82,90   ruptured disc   PERIPHERAL VASCULAR INTERVENTION Right 05/05/2017   Procedure: PERIPHERAL VASCULAR INTERVENTION;  Surgeon: [name], MD;  [facility];  Service: Cardiovascular;  Laterality: Right;   PERIPHERAL VASCULAR INTERVENTION Right 10/19/2018   Procedure: PERIPHERAL VASCULAR INTERVENTION;  Surgeon: [name], MD;  [facility];  Service: Cardiovascular;  Laterality: Right;   SCAR REVISION Right 2005   @ amputation   STUMP REVISION Right 08/21/2018   Procedure: REVISION RIGHT BELOW KNEE AMPUTATION, EXCISION  FIBULA;  Surgeon: [name], MD;  [facility];  Service: Orthopedics;  Laterality: Right;   STUMP REVISION Right 09/09/2018   Procedure: REVISION RIGHT BELOW KNEE AMPUTATION;  Surgeon: [name], MD;  Location: [facility] OR;  Service: Orthopedics;  Laterality: Right;   TOE AMPUTATION Right 2005   Social History   Occupational History   Not on file  Tobacco Use   Smoking status: Every Day    Packs/day: 1.00    Years: 47.00    Total pack years: 47.00    Types: Cigarettes    Start date: 03/04/1965   Smokeless tobacco: Never  Vaping Use   Vaping Use: Never used  Substance and Sexual Activity   Alcohol use: No   Drug use: No   Sexual activity: Not Currently

## 2022-02-11 ENCOUNTER — Other Ambulatory Visit: Payer: Self-pay | Admitting: Vascular Surgery

## 2022-02-11 DIAGNOSIS — Z9862 Peripheral vascular angioplasty status: Secondary | ICD-10-CM

## 2022-02-11 DIAGNOSIS — M792 Neuralgia and neuritis, unspecified: Secondary | ICD-10-CM

## 2022-02-11 DIAGNOSIS — E1151 Type 2 diabetes mellitus with diabetic peripheral angiopathy without gangrene: Secondary | ICD-10-CM

## 2022-02-14 ENCOUNTER — Other Ambulatory Visit: Payer: Self-pay | Admitting: Family Medicine

## 2022-02-14 DIAGNOSIS — Z0000 Encounter for general adult medical examination without abnormal findings: Secondary | ICD-10-CM

## 2022-02-14 DIAGNOSIS — I6523 Occlusion and stenosis of bilateral carotid arteries: Secondary | ICD-10-CM

## 2022-03-05 ENCOUNTER — Other Ambulatory Visit: Payer: Self-pay

## 2022-03-05 DIAGNOSIS — I739 Peripheral vascular disease, unspecified: Secondary | ICD-10-CM

## 2022-03-07 ENCOUNTER — Ambulatory Visit
Admission: RE | Admit: 2022-03-07 | Discharge: 2022-03-07 | Disposition: A | Discharge status: Home / Self Care | Payer: Medicare (Managed Care) | Source: Ambulatory Visit | Attending: Vascular Surgery | Admitting: Vascular Surgery

## 2022-03-07 DIAGNOSIS — E1151 Type 2 diabetes mellitus with diabetic peripheral angiopathy without gangrene: Secondary | ICD-10-CM

## 2022-03-07 DIAGNOSIS — Z9862 Peripheral vascular angioplasty status: Secondary | ICD-10-CM

## 2022-03-07 DIAGNOSIS — M792 Neuralgia and neuritis, unspecified: Secondary | ICD-10-CM

## 2022-03-14 ENCOUNTER — Telehealth: Payer: Self-pay

## 2022-03-14 ENCOUNTER — Other Ambulatory Visit: Payer: Self-pay

## 2022-03-14 DIAGNOSIS — M5410 Radiculopathy, site unspecified: Secondary | ICD-10-CM

## 2022-03-14 NOTE — Telephone Encounter (Signed)
-----   Message from Newt Minion, MD sent at 03/09/2022 10:09 AM EST ----- Have him follow up with [facility], for ddd lumbar spine, s/p mri ----- Message ----- From: Interface, Rad Results In Sent: 03/09/2022   9:07 AM EST To: Newt Minion, MD

## 2022-03-14 NOTE — Telephone Encounter (Signed)
Pt is aware of the results and would like to proceed with ESI. The order for this has been placed int he chart and advised that someone will call to schedule.

## 2022-03-22 ENCOUNTER — Ambulatory Visit
Admission: RE | Admit: 2022-03-22 | Discharge: 2022-03-22 | Disposition: A | Discharge status: Home / Self Care | Payer: Medicare (Managed Care) | Source: Ambulatory Visit | Attending: Family Medicine | Admitting: Family Medicine

## 2022-03-22 DIAGNOSIS — Z0000 Encounter for general adult medical examination without abnormal findings: Secondary | ICD-10-CM

## 2022-03-25 ENCOUNTER — Ambulatory Visit (HOSPITAL_COMMUNITY): Payer: Medicare (Managed Care)

## 2022-03-26 ENCOUNTER — Ambulatory Visit (INDEPENDENT_AMBULATORY_CARE_PROVIDER_SITE_OTHER): Payer: Medicare (Managed Care) | Admitting: Physical Medicine and Rehabilitation

## 2022-03-26 ENCOUNTER — Encounter: Payer: Self-pay | Admitting: Physical Medicine and Rehabilitation

## 2022-03-26 DIAGNOSIS — M4726 Other spondylosis with radiculopathy, lumbar region: Secondary | ICD-10-CM | POA: Diagnosis not present

## 2022-03-26 DIAGNOSIS — M5116 Intervertebral disc disorders with radiculopathy, lumbar region: Secondary | ICD-10-CM | POA: Diagnosis not present

## 2022-03-26 DIAGNOSIS — M48062 Spinal stenosis, lumbar region with neurogenic claudication: Secondary | ICD-10-CM | POA: Diagnosis not present

## 2022-03-26 DIAGNOSIS — M5416 Radiculopathy, lumbar region: Secondary | ICD-10-CM

## 2022-03-26 NOTE — Progress Notes (Signed)
   03/26/22 [place] in the past year? 1  Number of falls in past year 0  Was there an injury with Fall? 0  Fall Risk Category Calculator 1  Fall Risk  Patient at Risk for Falls Due to Impaired mobility;History of fall(s);Impaired balance/gait  Fall risk Follow up Falls evaluation completed;Falls prevention discussed

## 2022-03-26 NOTE — Progress Notes (Unsigned)
Functional Pain Scale - descriptive words and definitions  Moderate (4)   Constantly aware of pain, can complete ADLs with modification/sleep marginally affected at times/passive distraction is of no use, but active distraction gives some relief. Moderate range order  Average Pain 5-6    Lower back pain that radiates into stump on right side, can have numbness and tingling

## 2022-03-26 NOTE — Progress Notes (Unsigned)
Brian Zavala - 72 y.o. male MRN [id number]  Date of birth: [birth year]  Office Visit Note: Visit Date: 03/26/2022 PCP: Pcp, No Referred by: No ref. provider found  Subjective: Chief Complaint  Patient presents with   Lower Back - Pain   HPI: Brian Zavala is a 72 y.o. male who comes in today per the request of Dr. Meridee Score for evaluation of chronic, worsening and severe right sided buttock pain radiating down right posterolateral thigh down to stump. History of right above the knee amputation with Dr. Sharol Given in 2020. Patient states his pain prevents him from wearing prosthesis. Pain ongoing for several years, no aggravating factors, states pain is constant. He describes pain as sharp, sore and aching sensation, currently rates as 8 out of 10. Some relief of rest and use of medications. He is prescribed 56 tablets of 100 mg Morphine monthly by Clemens Catholic, NP with [facility]. He also takes Lyrica. Recent lumbar MRI imaging exhibits moderately severe spinal canal stenosis at L2-L3 and severe spinal canal stenosis at L3-L4. There is also right-sided disc osteophyte complex at L5-S1 with mass effect on the right side of the thecal sac and likely affecting the right S1 nerve root. No history of lumbar surgery/injections. Patient denies focal weakness. No recent trauma or falls.     Review of Systems  Musculoskeletal:  Positive for back pain.  Neurological:  Negative for tingling, sensory change, focal weakness and weakness.  All other systems reviewed and are negative.  Otherwise per HPI.  Assessment & Plan: Visit Diagnoses:    ICD-10-CM   1. Lumbar radiculopathy  M54.16     2. Intervertebral disc disorders with radiculopathy, lumbar region  M51.16     3. Other spondylosis with radiculopathy, lumbar region  M47.26     4. Spinal stenosis of lumbar region with neurogenic claudication  M48.062        Plan: Findings:  Chronic, worsening and severe right sided buttock  pain radiating down right posterolateral thigh down to stump. Patient continues to have pain despite good conservative therapies such as rest and use of medications. Patients clinical presentation and exam are consistent with more of S1 nerve pattern. There is right sided disc osteophyte at the level of L5-S1 likely affecting the right S1 nerve root. Next step is to perform diagnostic and hopefully therapeutic right S1 transforaminal epidural steroid injection under fluoroscopic guidance. Order has been placed, we are waiting approval from [facility]. If good relief of pain with injection we can repeat infrequently as needed. If pain persists we could look at injection at the level of L3 where there is severe spinal canal stenosis. I did discuss injection procedure with patient in detail today, he has no questions at this time. I instructed him to continue with current medication regimen. No red flag symptoms noted upon exam today.   Dr. Ernestina Patches participated with direct patient care including clinical review, exam when needed and significant portion of diagnostic and treatment plan.     Meds & Orders: No orders of the defined types were placed in this encounter.  No orders of the defined types were placed in this encounter.   Follow-up: Return for Right S1 transforaminal epidural steroid injection.   Procedures: No procedures performed      Clinical History: MRI LUMBAR SPINE WITHOUT CONTRAST   TECHNIQUE: Multiplanar, multisequence MR imaging of the lumbar spine was performed. No intravenous contrast was administered.   COMPARISON:  None Available.  FINDINGS: Segmentation: There are five lumbar type vertebral bodies. The last full intervertebral disc space is labeled L5-S1. There is partial sacralization of L5 on the left side with a large transverse process pseudo articulating with the left sacrum.   Alignment: Advanced degenerative lumbar spondylosis with degenerative anterolisthesis of  L3.   Vertebrae: Fatty endplate reactive changes at L3-4 and L4-5 but no bone lesions or fractures.   Conus medullaris and cauda equina: Conus extends to the T12 level. Conus and cauda equina appear normal.   Paraspinal and other soft tissues: No significant paraspinal or retroperitoneal findings. Simple right renal cyst measuring 2 cm. No change since prior CT scan from 2015. No further imaging evaluation follow-up is necessary.   Disc levels:   T12 ? L1 No disc protrusion. No foraminal stenosis. No central canal stenosis.   L1-2: Mild annular bulge and mild to moderate facet disease contributing to mild bilateral lateral recess stenosis. No significant spinal or foraminal stenosis.   L2-3: Diffuse bulging degenerated annulus and large broad-based left paracentral, foraminal and far lateral disc protrusion. There is also advanced facet disease and ligamentum flavum thickening and these findings contribute to moderately severe spinal and lateral recess stenosis. There is also mild to moderate left foraminal stenosis with extraforaminal displacement of the left L2 nerve root.   L3-4: Degenerative anterolisthesis of L3 due to severe facet disease. There is a bulging uncovered disc and osteophytic ridging. These findings contribute to severe spinal and bilateral lateral recess stenosis and moderately severe bilateral foraminal stenosis.   L4-5: Bulging degenerated annulus, osteophytic spurring and facet disease contributing to mild spinal stenosis and moderately severe bilateral lateral recess stenosis. There is also mild bilateral foraminal stenosis.   L5-S1: Surgical changes with right-sided laminectomy. Right-sided disc osteophyte complex with mass effect on the right side of the thecal sac and likely affecting the right S1 nerve root which appears somewhat enlarged and edematous. The exiting L5 nerve roots are unremarkable. Advanced facet disease.   IMPRESSION: 1.  Degenerative lumbar spondylosis with multilevel disc disease and facet disease. 2. Multilevel multifactorial spinal, lateral recess and foraminal stenosis as discussed above at the individual levels. The most significant levels are L2-3 and L3-4. 3. Right-sided disc osteophyte complex at L5-S1 with mass effect on the right side of the thecal sac and likely affecting the right S1 nerve root which appears somewhat enlarged and edematous.     Electronically Signed   By: Marijo Sanes M.D.   On: 03/09/2022 09:05   He reports that he has been smoking cigarettes. He started smoking about 57 years ago. He has a 47.00 pack-year smoking history. He has never used smokeless tobacco. No results for input(s): "HGBA1C", "LABURIC" in the last 8760 hours.  Objective:  VS:  HT:    WT:   BMI:     BP:   HR: bpm  TEMP: ( )  RESP:  Physical Exam Vitals and nursing note reviewed.  HENT:     Head: Normocephalic and atraumatic.     Right Ear: External ear normal.     Left Ear: External ear normal.     Nose: Nose normal.     Mouth/Throat:     Mouth: Mucous membranes are moist.  Eyes:     Extraocular Movements: Extraocular movements intact.  Cardiovascular:     Rate and Rhythm: Normal rate.     Pulses: Normal pulses.  Pulmonary:     Effort: Pulmonary effort is normal.  Abdominal:  General: Abdomen is flat. There is no distension.  Musculoskeletal:        General: Tenderness present.     Cervical back: Normal range of motion.     Comments: Right AKA. Pt able to rise from sitting to standing position with assistance. Good lumbar range of motion. Strong distal strength without clonus on left, no pain upon palpation of greater trochanters. Sensation intact bilaterally.   Skin:    General: Skin is warm and dry.     Capillary Refill: Capillary refill takes less than 2 seconds.  Neurological:     Mental Status: He is alert and oriented to person, place, and time.     Gait: Gait abnormal.   Psychiatric:        Mood and Affect: Mood normal.        Behavior: Behavior normal.     Ortho Exam  Imaging: No results found.  Past Medical/Family/Surgical/Social History: Medications & Allergies reviewed per EMR, new medications updated. 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) 11/27/2018   Hyperlipidemia 11/25/2018   Carotid artery disease (HCC) 11/25/2018   Critical limb ischemia with history of revascularization of same extremity (HCC) 10/19/2018   Below-knee amputation of right lower extremity (HCC) 09/09/2018   Dehiscence of amputation stump (HCC) 08/18/2018   H/O amputation of lesser toe, left (HCC) 05/06/2018   Peripheral vascular disease (HCC) 04/19/2018   Diabetic foot infection (HCC) 04/18/2018   Tobacco abuse counseling    Critical lower limb ischemia (HCC) 05/02/2017   Subacute osteomyelitis of right fibula (HCC)    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, right, sequela (HCC) 02/03/2017   Diabetes mellitus, insulin dependent (IDDM), controlled    Type 2 diabetes mellitus with diabetic neuropathy, with long-term current use of insulin (HCC)    S/P unilateral BKA (below knee amputation) (HCC)    Rheumatoid arthritis (HCC)    Gallstones 09/23/2013   Non-intractable cyclical vomiting with nausea 09/23/2013   Epidermal inclusion cyst 11/30/2012   Past Medical History:  Diagnosis Date   Arthritis    "pretty much all over"   Cellulitis of left foot    COPD (chronic obstructive pulmonary disease) (HCC)    Dyspnea    Hyperlipidemia    Hypertension    Neuromuscular disorder (HCC)    neuropathy legs   Neuropathy    legs   Osteomyelitis (HCC) 04/2018   4th toe left foot   Osteomyelitis of fourth toe of left foot (HCC) 04/19/2018   Peripheral vascular disease (HCC)    Type II diabetes mellitus (HCC)    Wears glasses    Wears partial dentures    top and bottom partials   Family History   Problem Relation Age of Onset   Cirrhosis Father    Past Surgical History:  Procedure Laterality Date   ABDOMINAL AORTOGRAM W/LOWER EXTREMITY Bilateral 10/19/2018   Procedure: ABDOMINAL AORTOGRAM W/LOWER EXTREMITY;  Surgeon: [name], MD;  Location: [facility];  Service: Cardiovascular;  Laterality: Bilateral;   ABOVE KNEE LEG AMPUTATION Right 11/27/2018   AMPUTATION Left 04/22/2018   Procedure: LEFT FOOT 4TH RAY AMPUTATION;  Surgeon: [name], MD;  Location: [facility];  Service: Orthopedics;  Laterality: Left;   AMPUTATION Right 11/27/2018   Procedure: RIGHT ABOVE KNEE AMPUTATION;  Surgeon: [name], MD;  Location: [facility] OR;  Service: Orthopedics;  Laterality: Right;   BACK SURGERY  CHOLECYSTECTOMY N/A 10/26/2013   Procedure: LAPAROSCOPIC CHOLECYSTECTOMY WITH INTRAOPERATIVE CHOLANGIOGRAM;  Surgeon: Clovis Pu. Cornett, MD;  Location: [facility] OR;  Service: General;  Laterality: N/A;   COLONOSCOPY     CYST EXCISION Bilateral 02/15/2015   Procedure: LEFT INDEX FINGER AND RIGHT MIDDLE FINGER NODULE EXCISION;  Surgeon: Tarry Kos, MD;  Location: [facility];  Service: Orthopedics;  Laterality: Bilateral;   CYST EXCISION PERINEAL N/A 12/08/2012   Procedure: CYST EXCISION PERINeum;  Surgeon: Maisie Fus A. Cornett, MD;  Location: [facility];  Service: General;  Laterality: N/A;   FOOT AMPUTATION Right 2005   I & D EXTREMITY Right 03/07/2017   Procedure: EXCISION FIBULAR HEAD RIGHT BELOW KNEE AMPUTATION;  Surgeon: [name], MD;  Location: [facility];  Service: Orthopedics;  Laterality: Right;   INCISE AND DRAIN ABCESS  12/02/2014   PERINEAL ABSCESS   INCISION AND DRAINAGE PERIRECTAL ABSCESS Left 12/02/2014   Procedure: IRRIGATION AND DEBRIDEMENT PERINEAL ABSCESS;  Surgeon: Abigail Miyamoto, MD;  Location: [facility];  Service: General;  Laterality: Left;   LEG AMPUTATION BELOW KNEE Right 2005   LOWER EXTREMITY ANGIOGRAPHY N/A 05/05/2017   Procedure: LOWER  EXTREMITY ANGIOGRAPHY;  Surgeon: [name], MD;  Location: [facility];  Service: Cardiovascular;  Laterality: N/A;   LUMBAR DISC SURGERY  82,90   ruptured disc   PERIPHERAL VASCULAR INTERVENTION Right 05/05/2017   Procedure: PERIPHERAL VASCULAR INTERVENTION;  Surgeon: [name], MD;  Location: [facility];  Service: Cardiovascular;  Laterality: Right;   PERIPHERAL VASCULAR INTERVENTION Right 10/19/2018   Procedure: PERIPHERAL VASCULAR INTERVENTION;  Surgeon: [name], MD;  Location: [facility];  Service: Cardiovascular;  Laterality: Right;   SCAR REVISION Right 2005   @ amputation   STUMP REVISION Right 08/21/2018   Procedure: REVISION RIGHT BELOW KNEE AMPUTATION, EXCISION FIBULA;  Surgeon: [name], MD;  Location: [facility];  Service: Orthopedics;  Laterality: Right;   STUMP REVISION Right 09/09/2018   Procedure: REVISION RIGHT BELOW KNEE AMPUTATION;  Surgeon: [name], MD;  Location: [facility] OR;  Service: Orthopedics;  Laterality: Right;   TOE AMPUTATION Right 2005   Social History   Occupational History   Not on file  Tobacco Use   Smoking status: Every Day    Packs/day: 1.00    Years: 47.00    Total pack years: 47.00    Types: Cigarettes    Start date: 03/04/1965   Smokeless tobacco: Never  Vaping Use   Vaping Use: Never used  Substance and Sexual Activity   Alcohol use: No   Drug use: No   Sexual activity: Not Currently

## 2022-03-28 ENCOUNTER — Telehealth: Payer: Self-pay | Admitting: Physical Medicine and Rehabilitation

## 2022-03-28 NOTE — Telephone Encounter (Signed)
Patient called needing to schedule an appointment for an injection in his back. The number to contact patient is [phone]

## 2022-03-28 NOTE — Telephone Encounter (Signed)
Spoke with patient and scheduled injection for 04/02/22 

## 2022-04-02 ENCOUNTER — Ambulatory Visit: Payer: Self-pay

## 2022-04-02 ENCOUNTER — Ambulatory Visit (INDEPENDENT_AMBULATORY_CARE_PROVIDER_SITE_OTHER): Payer: Medicare (Managed Care) | Admitting: Physical Medicine and Rehabilitation

## 2022-04-02 VITALS — BP 129/80 | HR 71

## 2022-04-02 DIAGNOSIS — M5416 Radiculopathy, lumbar region: Secondary | ICD-10-CM | POA: Diagnosis not present

## 2022-04-02 MED ORDER — METHYLPREDNISOLONE ACETATE 80 MG/ML IJ SUSP
80.0000 mg | Freq: Once | INTRAMUSCULAR | Status: AC
  Administered 2022-04-02: 80 mg

## 2022-04-02 NOTE — Patient Instructions (Signed)

## 2022-04-02 NOTE — Progress Notes (Unsigned)
Functional Pain Scale - descriptive words and definitions  Moderate (4)   Constantly aware of pain, can complete ADLs with modification/sleep marginally affected at times/passive distraction is of no use, but active distraction gives some relief. Moderate range order  Average Pain 8   +Driver, -BT, -Dye Allergies.  Pain in right stump

## 2022-04-03 NOTE — Progress Notes (Signed)
DRU PRIMEAU - 72 y.o. male MRN [id number]  Date of birth: 1950/10/19  Office Visit Note: Visit Date: 04/02/2022 PCP: Pcp, No Referred by: Newt Minion, MD  Subjective: Chief Complaint  Patient presents with   Lower Back - Pain   HPI:  Brian Zavala is a 72 y.o. male who comes in today at the request of Barnet Pall, FNP and Dr. Meridee Score for planned Right S1-2 Lumbar Transforaminal epidural steroid injection with fluoroscopic guidance.  The patient has failed conservative care including home exercise, medications, time and activity modification.  This injection will be diagnostic and hopefully therapeutic.  Please see requesting physician notes for further details and justification. See procedure note, transitional segment, consider L5 injection.   ROS Otherwise per HPI.  Assessment & Plan: Visit Diagnoses:    ICD-10-CM   1. Lumbar radiculopathy  M54.16 XR C-ARM NO REPORT    Epidural Steroid injection    methylPREDNISolone acetate (DEPO-MEDROL) injection 80 mg      Plan: No additional findings.   Meds & Orders:  Meds ordered this encounter  Medications   methylPREDNISolone acetate (DEPO-MEDROL) injection 80 mg    Orders Placed This Encounter  Procedures   XR C-ARM NO REPORT   Epidural Steroid injection    Follow-up: Return for visit to requesting provider as needed.   Procedures: No procedures performed  S1 Lumbosacral Transforaminal Epidural Steroid Injection - Sub-Pedicular Approach with Fluoroscopic Guidance   Patient: Brian Zavala      Date of Birth: 02/11/1951 MRN: [id number] PCP: Pcp, No      Visit Date: 04/02/2022   Universal Protocol:    Date/Time: 01/31/248:06 AM  Consent Given By: the patient  Position:  PRONE  Additional Comments: Vital signs were monitored before and after the procedure. Patient was prepped and draped in the usual sterile fashion. The correct patient, procedure, and site was verified.   Injection Procedure  Details:  Procedure Site One Meds Administered:  Meds ordered this encounter  Medications   methylPREDNISolone acetate (DEPO-MEDROL) injection 80 mg    Laterality: Right  Location/Site:  S1 Foramen   Needle size: 22 ga.  Needle type: Spinal  Needle Placement: Transforaminal  Findings:   -Comments: Excellent flow of contrast along the nerve, nerve root and into the epidural space. 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 Initial visualization of the S1 foramen on the right was difficult.   Epidurogram: Contrast epidurogram showed no nerve root cut off or restricted flow pattern.  Procedure Details: After squaring off the sacral end-plate to get a true AP view, the C-arm was positioned so that the best possible view of the S1 foramen was visualized. The soft tissues overlying this structure were infiltrated with 2-3 ml. of 1% Lidocaine without Epinephrine.    The spinal needle was inserted toward the target using a "trajectory" view along the fluoroscope beam.  Under AP and lateral visualization, the needle was advanced so it did not puncture dura. Biplanar projections were used to confirm position. Aspiration was confirmed to be negative for CSF and/or blood. A 1-2 ml. volume of Isovue-250 was injected and flow of contrast was noted at each level. Radiographs were obtained for documentation purposes.   After attaining the desired flow of contrast documented above, a 0.5 to 1.0 ml test dose of 0.25% Marcaine was injected into each respective transforaminal space.  The patient was observed for 90 seconds post injection.  After no sensory  deficits were reported, and normal lower extremity motor function was noted,   the above injectate was administered so that equal amounts of the injectate were placed at each foramen (level) into the transforaminal epidural space.   Additional Comments:  No complications occurred Dressing: Band-Aid with 2 x 2  sterile gauze    Post-procedure details: Patient was observed during the procedure. Post-procedure instructions were reviewed.  Patient left the clinic in stable condition.   Clinical History: MRI LUMBAR SPINE WITHOUT CONTRAST   TECHNIQUE: Multiplanar, multisequence MR imaging of the lumbar spine was performed. No intravenous contrast was administered.   COMPARISON:  None Available.   FINDINGS: Segmentation: There are five lumbar type vertebral bodies. The last full intervertebral disc space is labeled L5-S1. There is partial sacralization of L5 on the left side with a large transverse process pseudo articulating with the left sacrum.   Alignment: Advanced degenerative lumbar spondylosis with degenerative anterolisthesis of L3.   Vertebrae: Fatty endplate reactive changes at L3-4 and L4-5 but no bone lesions or fractures.   Conus medullaris and cauda equina: Conus extends to the T12 level. Conus and cauda equina appear normal.   Paraspinal and other soft tissues: No significant paraspinal or retroperitoneal findings. Simple right renal cyst measuring 2 cm. No change since prior CT scan from 2015. No further imaging evaluation follow-up is necessary.   Disc levels:   T12 ? L1 No disc protrusion. No foraminal stenosis. No central canal stenosis.   L1-2: Mild annular bulge and mild to moderate facet disease contributing to mild bilateral lateral recess stenosis. No significant spinal or foraminal stenosis.   L2-3: Diffuse bulging degenerated annulus and large broad-based left paracentral, foraminal and far lateral disc protrusion. There is also advanced facet disease and ligamentum flavum thickening and these findings contribute to moderately severe spinal and lateral recess stenosis. There is also mild to moderate left foraminal stenosis with extraforaminal displacement of the left L2 nerve root.   L3-4: Degenerative anterolisthesis of L3 due to severe  facet disease. There is a bulging uncovered disc and osteophytic ridging. These findings contribute to severe spinal and bilateral lateral recess stenosis and moderately severe bilateral foraminal stenosis.   L4-5: Bulging degenerated annulus, osteophytic spurring and facet disease contributing to mild spinal stenosis and moderately severe bilateral lateral recess stenosis. There is also mild bilateral foraminal stenosis.   L5-S1: Surgical changes with right-sided laminectomy. Right-sided disc osteophyte complex with mass effect on the right side of the thecal sac and likely affecting the right S1 nerve root which appears somewhat enlarged and edematous. The exiting L5 nerve roots are unremarkable. Advanced facet disease.   IMPRESSION: 1. Degenerative lumbar spondylosis with multilevel disc disease and facet disease. 2. Multilevel multifactorial spinal, lateral recess and foraminal stenosis as discussed above at the individual levels. The most significant levels are L2-3 and L3-4. 3. Right-sided disc osteophyte complex at L5-S1 with mass effect on the right side of the thecal sac and likely affecting the right S1 nerve root which appears somewhat enlarged and edematous.     Electronically Signed   By: Marijo Sanes M.D.   On: 03/09/2022 09:05     Objective:  VS:  HT:    WT:   BMI:     BP:129/80  HR:71bpm  TEMP: ( )  RESP:  Physical Exam Vitals and nursing note reviewed.  Constitutional:      General: He is not in acute distress.    Appearance: Normal appearance. He is not ill-appearing.  HENT:     Head: Normocephalic and atraumatic.     Right Ear: External ear normal.     Left Ear: External ear normal.     Nose: No congestion.  Eyes:     Extraocular Movements: Extraocular movements intact.  Cardiovascular:     Rate and Rhythm: Normal rate.     Pulses: Normal pulses.  Pulmonary:     Effort: Pulmonary effort is normal. No respiratory distress.  Abdominal:      General: There is no distension.     Palpations: Abdomen is soft.  Musculoskeletal:        General: No tenderness or signs of injury.     Cervical back: Neck supple.     Right lower leg: No edema.     Left lower leg: No edema.     Comments: Patient has good distal strength without clonus. Right lower limb amputation.  Skin:    Findings: No erythema or rash.  Neurological:     General: No focal deficit present.     Mental Status: He is alert and oriented to person, place, and time.     Sensory: No sensory deficit.     Motor: No weakness or abnormal muscle tone.     Coordination: Coordination normal.  Psychiatric:        Mood and Affect: Mood normal.        Behavior: Behavior normal.      Imaging: XR C-ARM NO REPORT  Result Date: 04/02/2022 Please see Notes tab for imaging impression.

## 2022-04-03 NOTE — Procedures (Signed)
S1 Lumbosacral Transforaminal Epidural Steroid Injection - Sub-Pedicular Approach with Fluoroscopic Guidance   Patient: Brian Zavala      Date of Birth: September 20, 1950 MRN: [id number] PCP: Pcp, No      Visit Date: 04/02/2022   Universal Protocol:    Date/Time: 01/31/248:06 AM  Consent Given By: the patient  Position:  PRONE  Additional Comments: Vital signs were monitored before and after the procedure. Patient was prepped and draped in the usual sterile fashion. The correct patient, procedure, and site was verified.   Injection Procedure Details:  Procedure Site One Meds Administered:  Meds ordered this encounter  Medications   methylPREDNISolone acetate (DEPO-MEDROL) injection 80 mg    Laterality: Right  Location/Site:  S1 Foramen   Needle size: 22 ga.  Needle type: Spinal  Needle Placement: Transforaminal  Findings:   -Comments: Excellent flow of contrast along the nerve, nerve root and into the epidural space. 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 Initial visualization of the S1 foramen on the right was difficult.   Epidurogram: Contrast epidurogram showed no nerve root cut off or restricted flow pattern.  Procedure Details: After squaring off the sacral end-plate to get a true AP view, the C-arm was positioned so that the best possible view of the S1 foramen was visualized. The soft tissues overlying this structure were infiltrated with 2-3 ml. of 1% Lidocaine without Epinephrine.    The spinal needle was inserted toward the target using a "trajectory" view along the fluoroscope beam.  Under AP and lateral visualization, the needle was advanced so it did not puncture dura. Biplanar projections were used to confirm position. Aspiration was confirmed to be negative for CSF and/or blood. A 1-2 ml. volume of Isovue-250 was injected and flow of contrast was noted at each level. Radiographs were obtained for documentation purposes.    After attaining the desired flow of contrast documented above, a 0.5 to 1.0 ml test dose of 0.25% Marcaine was injected into each respective transforaminal space.  The patient was observed for 90 seconds post injection.  After no sensory deficits were reported, and normal lower extremity motor function was noted,   the above injectate was administered so that equal amounts of the injectate were placed at each foramen (level) into the transforaminal epidural space.   Additional Comments:  No complications occurred Dressing: Band-Aid with 2 x 2 sterile gauze    Post-procedure details: Patient was observed during the procedure. Post-procedure instructions were reviewed.  Patient left the clinic in stable condition.

## 2022-04-16 ENCOUNTER — Ambulatory Visit: Payer: Medicare (Managed Care) | Attending: Cardiovascular Disease | Admitting: Cardiovascular Disease

## 2022-04-16 ENCOUNTER — Encounter: Payer: Self-pay | Admitting: Cardiovascular Disease

## 2022-04-16 VITALS — BP 104/50 | HR 99 | Ht 73.0 in | Wt 165.0 lb

## 2022-04-16 DIAGNOSIS — Z716 Tobacco abuse counseling: Secondary | ICD-10-CM | POA: Diagnosis not present

## 2022-04-16 DIAGNOSIS — I6523 Occlusion and stenosis of bilateral carotid arteries: Secondary | ICD-10-CM | POA: Diagnosis not present

## 2022-04-16 DIAGNOSIS — E782 Mixed hyperlipidemia: Secondary | ICD-10-CM

## 2022-04-16 DIAGNOSIS — I739 Peripheral vascular disease, unspecified: Secondary | ICD-10-CM

## 2022-04-16 DIAGNOSIS — I70229 Atherosclerosis of native arteries of extremities with rest pain, unspecified extremity: Secondary | ICD-10-CM

## 2022-04-16 NOTE — Assessment & Plan Note (Signed)
Ongoing tobacco abuse of 1 pack/day recalcitrant to risk factor modification.

## 2022-04-16 NOTE — Progress Notes (Signed)
04/16/2022 Brian Zavala   Nov 04, 1950  [id number]  Primary Physician Pcp, No Primary Cardiologist: Lorretta Harp MD Brian Zavala, [place]  HPI:  Brian Zavala is a 72 y.o.   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 He was referred by Dr. Sharol Given for peripheral vascular evaluation because of critical limb ischemia.  I last saw him in the office 04/03/2021.  He has a history of 75-pack-years of tobacco abuse currently smoking 1-1/2 packs a day as well as 2 hypertension, diabetes and hyperlipidemia. He has never had a heart attack or stroke. 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 In June of last year he developed leg stump ulcer which has not healed. Dopplers performed in our office yesterday showed an occluded right SFA.. I performed angiography on him 05/05/17 revealing an occluded right SFA from the origin down the adductor canal which I was able to revascularize using directional atherectomy with drug-eluting balloon angioplasty. Did have an occluded popliteal as well. Follow-up lower extremity arterial Doppler studies performed 05/14/17 showed marked improvement. He did also stop smoking at that time.    He had begun hyperbaric therapy and his right stump ulcer has completely healed after 6 months.  He did start smoking again.  Unfortunately developed a recurrent ulcer several months ago and had repeat surgery by Dr. Sharol Given 09/09/2018 with revision of his stump.  Unfortunately, the wound has been slow to heal.  Follow-up Dopplers performed today revealed occluded right SFA.   I performed peripheral angiography on him 10/19/2018 revealing occluded right SFA which I was able to revascularized using atherectomy and drug-coated balloon angioplasty.  Unfortunate there was slow flow although follow-up Doppler studies performed 11/04/2018 revealed his SFA to be widely patent.  He continues to have infection in his stump with severe  pain and I suspect will ultimately require right above-the-knee amputation.  He did stop smoking on August 1 however.   Since I saw him in the office a year or ago he continues to do well.  His above-the-knee amputation has healed well.  He does continue to smoke a pack and a half a day unfortunately.  His lower extremity arterial Doppler studies performed 11/07/2020 revealed an occluded right SFA.  He is nonambulatory and wheelchair-bound.  He denies chest pain or shortness of breath.   Current Meds  Medication Sig   aspirin 81 MG chewable tablet Chew 81 mg by mouth daily.   atorvastatin (LIPITOR) 80 MG tablet Take 1 tablet (80 mg total) by mouth daily at 6 PM.   calcium carbonate (OSCAL) 1500 (600 Ca) MG TABS tablet Take 600 mg of elemental calcium by mouth daily with breakfast.    cholecalciferol (VITAMIN D) 1000 UNITS tablet Take 1,000 Units by mouth daily.   docusate sodium (COLACE) 100 MG capsule Take 100 mg by mouth 2 (two) times daily.   furosemide (LASIX) 20 MG tablet Take 20 mg by mouth daily.   hydrochlorothiazide (MICROZIDE) 12.5 MG capsule Take 12.5 mg by mouth daily.   leflunomide (ARAVA) 20 MG tablet Take 20 mg by mouth daily.   Liraglutide (VICTOZA) 18 MG/3ML SOLN injection Inject 1.8 mg into the skin every evening.    metFORMIN (GLUCOPHAGE) 1000 MG tablet Take 1,000 mg by mouth 2 (two) times daily.   morphine (MS CONTIN) 100 MG 12 hr tablet Take 100 mg by mouth every 12 (twelve) hours.   oxymetazoline (  AFRIN) 0.05 % nasal spray Place 1 spray into both nostrils 2 (two) times daily as needed for congestion.   predniSONE (DELTASONE) 1 MG tablet Take 2 mg by mouth daily with breakfast.   pregabalin (LYRICA) 75 MG capsule Take 75 mg by mouth See admin instructions. Take 75 mg by mouth in the morning and 150 mg in the afternoon   vitamin B-12 (CYANOCOBALAMIN) 100 MCG tablet Take 100 mcg by mouth daily.   [DISCONTINUED] clopidogrel (PLAVIX) 75 MG tablet Take 1 tablet (75 mg total) by  mouth daily with breakfast.   [DISCONTINUED] Insulin Glargine, 1 Unit Dial, (TOUJEO SOLOSTAR) 300 UNIT/ML SOPN Inject 40 Units into the skin daily with Zavala. (Patient taking differently: Inject 80 Units into the skin daily with Zavala.)     Allergies  Allergen Reactions   Varenicline Nausea Only    Social History   Socioeconomic History   Marital status: Divorced    Spouse name: Not on file   Number of children: Not on file   Years of education: Not on file   Highest education level: Not on file  Occupational History   Not on file  Tobacco Use   Smoking status: Every Day    Packs/day: 1.00    Years: 47.00    Total pack years: 47.00    Types: Cigarettes    Start date: 03/04/1965   Smokeless tobacco: Never  Vaping Use   Vaping Use: Never used  Substance and Sexual Activity   Alcohol use: No   Drug use: No   Sexual activity: Not Currently  Other Topics Concern   Not on file  Social History Narrative   Not on file   Social Determinants of Health   Financial Resource Strain: Not on file  Food Insecurity: Not on file  Transportation Needs: Not on file  Physical Activity: Not on file  Stress: Not on file  Social Connections: Not on file  Intimate Partner Violence: Not on file     Review of Systems: General: negative for chills, fever, night sweats or weight changes.  Cardiovascular: negative for chest pain, dyspnea on exertion, edema, orthopnea, palpitations, paroxysmal nocturnal dyspnea or shortness of breath Dermatological: negative for rash Respiratory: negative for cough or wheezing Urologic: negative for hematuria Abdominal: negative for nausea, vomiting, diarrhea, bright red blood per rectum, melena, or hematemesis Neurologic: negative for visual changes, syncope, or dizziness All other systems reviewed and are otherwise negative except as noted above.    Blood pressure (!) 104/50, pulse 99, height 6' 1"$  (1.854 m), weight 165 lb (74.8 kg).  General  appearance: alert and no distress Neck: no adenopathy, no JVD, supple, symmetrical, trachea midline, thyroid not enlarged, symmetric, no tenderness/mass/nodules, and bilateral carotid bruits Lungs: clear to auscultation bilaterally Heart: regular rate and rhythm, S1, S2 normal, no murmur, click, rub or gallop Extremities: Status post right AKA Pulses: Status post right AKA, otherwise diminished pedal pulses. Skin: Skin color, texture, turgor normal. No rashes or lesions Neurologic: Grossly normal  EKG sinus rhythm at 99 with septal Q waves and nonspecific ST and T wave changes.  I personally reviewed this EKG.  ASSESSMENT AND PLAN:   Critical lower limb ischemia History of chronic limb ischemia status post SFA intervention by myself twice most recently 10/19/2018.  He is status post BKA which eventually was revised to an AKA.  He is currently wheelchair-bound.  His stump is well-healed.  Tobacco abuse counseling Ongoing tobacco abuse of 1 pack/day recalcitrant to risk factor modification.  Hyperlipidemia History of hyperlipidemia on high-dose statin therapy with lipid profile performed 06/29/19 revealing total cholesterol 86, LDL 36 and HDL 31.  Carotid artery disease ([place]) History of moderate bilateral ICA stenosis by duplex ultrasound performed 11/07/2021.  This will be repeated on an annual basis.     Lorretta Harp MD FACP,FACC,FAHA, [facility] 04/16/2022 10:37 AM

## 2022-04-16 NOTE — Assessment & Plan Note (Signed)
History of chronic limb ischemia status post SFA intervention by myself twice most recently 10/19/2018.  He is status post BKA which eventually was revised to an AKA.  He is currently wheelchair-bound.  His stump is well-healed.

## 2022-04-16 NOTE — Assessment & Plan Note (Signed)
History of moderate bilateral ICA stenosis by duplex ultrasound performed 11/07/2021.  This will be repeated on an annual basis.

## 2022-04-16 NOTE — Assessment & Plan Note (Signed)
History of hyperlipidemia on high-dose statin therapy with lipid profile performed 06/29/19 revealing total cholesterol 86, LDL 36 and HDL 31.

## 2022-04-16 NOTE — Patient Instructions (Signed)
Medication Instructions:  Your physician recommends that you continue on your current medications as directed. Please refer to the Current Medication list given to you today.  *If you need a refill on your cardiac medications before your next appointment, please call your pharmacy*   Testing/Procedures: Your physician has requested that you have a carotid duplex. This test is an ultrasound of the carotid arteries in your neck. It looks at blood flow through these arteries that supply the brain with blood. Allow one hour for this exam. There are no restrictions or special instructions. This will take place at [place], [address]. To be done in September.   Follow-Up: At [facility], you and your health needs are our priority.  As part of our continuing mission to provide you with exceptional heart care, we have created designated Provider Care Teams.  These Care Teams include your primary Cardiologist (physician) and Advanced Practice Providers (APPs -  Physician Assistants and Nurse Practitioners) who all work together to provide you with the care you need, when you need it.  We recommend signing up for the patient portal called "MyChart".  Sign up information is provided on this After Visit Summary.  MyChart is used to connect with patients for Virtual Visits (Telemedicine).  Patients are able to view lab/test results, encounter notes, upcoming appointments, etc.  Non-urgent messages can be sent to your provider as well.   To learn more about what you can do with MyChart, go to [email or web address].    Your next appointment:   12 month(s)  Provider:   Quay Burow, MD

## 2022-05-03 ENCOUNTER — Ambulatory Visit (HOSPITAL_COMMUNITY)
Admission: RE | Admit: 2022-05-03 | Discharge: 2022-05-03 | Disposition: A | Discharge status: Home / Self Care | Payer: Medicare (Managed Care) | Source: Ambulatory Visit | Attending: Cardiology | Admitting: Cardiology

## 2022-05-03 DIAGNOSIS — I739 Peripheral vascular disease, unspecified: Secondary | ICD-10-CM | POA: Diagnosis present

## 2022-05-03 LAB — VAS US ABI WITH/WO TBI: Left ABI: 0.98

## 2022-08-12 ENCOUNTER — Other Ambulatory Visit: Payer: Self-pay | Admitting: Vascular Surgery

## 2022-08-12 DIAGNOSIS — R911 Solitary pulmonary nodule: Secondary | ICD-10-CM

## 2022-08-13 ENCOUNTER — Encounter (HOSPITAL_COMMUNITY): Payer: Self-pay

## 2022-08-13 ENCOUNTER — Other Ambulatory Visit: Payer: Self-pay

## 2022-08-13 ENCOUNTER — Emergency Department (HOSPITAL_COMMUNITY): Payer: Medicare (Managed Care)

## 2022-08-13 ENCOUNTER — Emergency Department (HOSPITAL_COMMUNITY)
Admission: EM | Admit: 2022-08-13 | Discharge: 2022-08-13 | Disposition: A | Discharge status: Home / Self Care | Payer: Medicare (Managed Care) | Attending: Emergency Medicine | Admitting: Emergency Medicine

## 2022-08-13 ENCOUNTER — Other Ambulatory Visit: Payer: Self-pay | Admitting: Vascular Surgery

## 2022-08-13 DIAGNOSIS — R1030 Lower abdominal pain, unspecified: Secondary | ICD-10-CM | POA: Diagnosis present

## 2022-08-13 DIAGNOSIS — Z7984 Long term (current) use of oral hypoglycemic drugs: Secondary | ICD-10-CM | POA: Insufficient documentation

## 2022-08-13 DIAGNOSIS — J449 Chronic obstructive pulmonary disease, unspecified: Secondary | ICD-10-CM | POA: Insufficient documentation

## 2022-08-13 DIAGNOSIS — K529 Noninfective gastroenteritis and colitis, unspecified: Secondary | ICD-10-CM | POA: Insufficient documentation

## 2022-08-13 DIAGNOSIS — E119 Type 2 diabetes mellitus without complications: Secondary | ICD-10-CM | POA: Diagnosis not present

## 2022-08-13 DIAGNOSIS — E1151 Type 2 diabetes mellitus with diabetic peripheral angiopathy without gangrene: Secondary | ICD-10-CM

## 2022-08-13 DIAGNOSIS — Z9862 Peripheral vascular angioplasty status: Secondary | ICD-10-CM

## 2022-08-13 DIAGNOSIS — Z7982 Long term (current) use of aspirin: Secondary | ICD-10-CM | POA: Diagnosis not present

## 2022-08-13 DIAGNOSIS — E1140 Type 2 diabetes mellitus with diabetic neuropathy, unspecified: Secondary | ICD-10-CM

## 2022-08-13 DIAGNOSIS — R112 Nausea with vomiting, unspecified: Secondary | ICD-10-CM | POA: Diagnosis not present

## 2022-08-13 LAB — COMPREHENSIVE METABOLIC PANEL
ALT: 44 U/L (ref 0–44)
AST: 50 U/L — ABNORMAL HIGH (ref 15–41)
Albumin: 3.5 g/dL (ref 3.5–5.0)
Alkaline Phosphatase: 53 U/L (ref 38–126)
Anion gap: 10 (ref 5–15)
BUN: 14 mg/dL (ref 8–23)
CO2: 25 mmol/L (ref 22–32)
Calcium: 8.6 mg/dL — ABNORMAL LOW (ref 8.9–10.3)
Chloride: 100 mmol/L (ref 98–111)
Creatinine, Ser: 0.64 mg/dL (ref 0.61–1.24)
GFR, Estimated: >60 mL/min (ref >60)
Glucose, Bld: 231 mg/dL — ABNORMAL HIGH (ref 70–99)
Potassium: 2.9 mmol/L — ABNORMAL LOW (ref 3.5–5.1)
Sodium: 135 mmol/L (ref 135–145)
Total Bilirubin: 1.1 mg/dL (ref 0.3–1.2)
Total Protein: 6.7 g/dL (ref 6.5–8.1)

## 2022-08-13 LAB — LIPASE, BLOOD: Lipase: 49 U/L (ref 11–51)

## 2022-08-13 LAB — CBC
HCT: 47.3 % (ref 39.0–52.0)
Hemoglobin: 15.8 g/dL (ref 13.0–17.0)
MCH: 29.5 pg (ref 26.0–34.0)
MCHC: 33.4 g/dL (ref 30.0–36.0)
MCV: 88.2 fL (ref 80.0–100.0)
Platelets: 218 10*3/uL (ref 150–400)
RBC: 5.36 MIL/uL (ref 4.22–5.81)
RDW: 14.4 % (ref 11.5–15.5)
WBC: 7.5 10*3/uL (ref 4.0–10.5)
nRBC: 0 % (ref 0.0–0.2)

## 2022-08-13 LAB — URINALYSIS, ROUTINE W REFLEX MICROSCOPIC
Bilirubin Urine: NEGATIVE
Glucose, UA: 50 mg/dL — AB
Hgb urine dipstick: NEGATIVE
Ketones, ur: NEGATIVE mg/dL
Nitrite: NEGATIVE
Protein, ur: NEGATIVE mg/dL
Specific Gravity, Urine: 1.044 — ABNORMAL HIGH (ref 1.005–1.030)
WBC, UA: >50 WBC/hpf (ref 0–5)
pH: 5 (ref 5.0–8.0)

## 2022-08-13 MED ORDER — SODIUM CHLORIDE 0.9 % IV BOLUS: 1000.0000 mL | Freq: Once | INTRAVENOUS | Status: DC

## 2022-08-13 MED ORDER — POTASSIUM CHLORIDE CRYS ER 20 MEQ PO TBCR
40.0000 meq | EXTENDED_RELEASE_TABLET | Freq: Once | ORAL | Status: AC
  Administered 2022-08-13: 40 meq via ORAL
  Filled 2022-08-13: qty 2

## 2022-08-13 MED ORDER — FENTANYL CITRATE PF 50 MCG/ML IJ SOSY
50.0000 ug | PREFILLED_SYRINGE | Freq: Once | INTRAMUSCULAR | Status: AC
  Administered 2022-08-13: 50 ug via INTRAVENOUS
  Filled 2022-08-13: qty 1

## 2022-08-13 MED ORDER — AMOXICILLIN-POT CLAVULANATE 875-125 MG PO TABS
1.0000 | ORAL_TABLET | Freq: Once | ORAL | Status: AC
  Administered 2022-08-13: 1 via ORAL
  Filled 2022-08-13: qty 1

## 2022-08-13 MED ORDER — IOHEXOL 300 MG/ML  SOLN
100.0000 mL | Freq: Once | INTRAMUSCULAR | Status: AC | PRN
  Administered 2022-08-13: 100 mL via INTRAVENOUS

## 2022-08-13 MED ORDER — POTASSIUM CHLORIDE 10 MEQ/100ML IV SOLN
10.0000 meq | Freq: Once | INTRAVENOUS | Status: AC
  Administered 2022-08-13: 10 meq via INTRAVENOUS
  Filled 2022-08-13: qty 100

## 2022-08-13 MED ORDER — AMOXICILLIN-POT CLAVULANATE 875-125 MG PO TABS: 1.0000 | ORAL_TABLET | Freq: Two times a day (BID) | ORAL | 0 refills | Status: DC

## 2022-08-13 MED ORDER — SODIUM CHLORIDE 0.9 % IV BOLUS
1000.0000 mL | Freq: Once | INTRAVENOUS | Status: AC
  Administered 2022-08-13: 1000 mL via INTRAVENOUS

## 2022-08-13 MED ORDER — ONDANSETRON HCL 4 MG/2ML IJ SOLN
4.0000 mg | Freq: Once | INTRAMUSCULAR | Status: AC
  Administered 2022-08-13: 4 mg via INTRAVENOUS
  Filled 2022-08-13: qty 2

## 2022-08-13 MED ORDER — OXYMETAZOLINE HCL 0.05 % NA SOLN
1.0000 | Freq: Once | NASAL | Status: AC
  Administered 2022-08-13: 1 via NASAL
  Filled 2022-08-13: qty 30

## 2022-08-13 NOTE — ED Provider Notes (Signed)
[place] EMERGENCY DEPARTMENT AT [facility] Provider Note   CSN: [id number] Arrival date & time: 08/13/22  1916     History  Chief Complaint  Patient presents with   Abdominal Pain    Brian Zavala is a 72 y.o. male.  Patient here with lower abdominal pain.  History of gastroparesis.  History of diabetes, COPD.  Denies any diarrhea.  He is thrown up 3 times.  Nothing makes it worse or better.  Denies any chest pain shortness of breath fever or chills.  The history is provided by the patient.       Home Medications Prior to Admission medications   Medication Sig Start Date End Date Taking? Authorizing Provider  amoxicillin-clavulanate (AUGMENTIN) 875-125 MG tablet Take 1 tablet by mouth every 12 (twelve) hours for 7 days. 08/13/22 08/20/22 Yes Vivica Dobosz, DO  aspirin 81 MG chewable tablet Chew 81 mg by mouth daily.    [provider]  atorvastatin (LIPITOR) 80 MG tablet Take 1 tablet (80 mg total) by mouth daily at 6 PM. 05/06/17   Arty Baumgartner, NP  calcium carbonate (OSCAL) 1500 (600 Ca) MG TABS tablet Take 600 mg of elemental calcium by mouth daily with breakfast.     [provider]  cholecalciferol (VITAMIN D) 1000 UNITS tablet Take 1,000 Units by mouth daily.    [provider]  docusate sodium (COLACE) 100 MG capsule Take 100 mg by mouth 2 (two) times daily.    [provider]  furosemide (LASIX) 20 MG tablet Take 20 mg by mouth daily.    [provider]  hydrochlorothiazide (MICROZIDE) 12.5 MG capsule Take 12.5 mg by mouth daily.    [provider]  leflunomide (ARAVA) 20 MG tablet Take 20 mg by mouth daily.    [provider]  Liraglutide (VICTOZA) 18 MG/3ML SOLN injection Inject 1.8 mg into the skin every evening.     [provider]  metFORMIN (GLUCOPHAGE) 1000 MG tablet Take 1,000 mg by mouth 2 (two) times daily.    [provider]  metoprolol tartrate (LOPRESSOR) 25  MG tablet Take 0.5 tablets (12.5 mg total) by mouth 2 (two) times daily. 10/20/18 12/19/18  Arty Baumgartner, NP  morphine (MS CONTIN) 100 MG 12 hr tablet Take 100 mg by mouth every 12 (twelve) hours.    [provider]  oxymetazoline (AFRIN) 0.05 % nasal spray Place 1 spray into both nostrils 2 (two) times daily as needed for congestion.    [provider]  predniSONE (DELTASONE) 1 MG tablet Take 2 mg by mouth daily with breakfast.    [provider]  pregabalin (LYRICA) 75 MG capsule Take 75 mg by mouth See admin instructions. Take 75 mg by mouth in the morning and 150 mg in the afternoon    [provider]  vitamin B-12 (CYANOCOBALAMIN) 100 MCG tablet Take 100 mcg by mouth daily.    [provider]      Allergies    Varenicline    Review of Systems   Review of Systems  Physical Exam Updated Vital Signs BP 125/69   Pulse (!) 111   Temp 97.9 F (36.6 C) (Oral)   Resp 15   SpO2 95%  Physical Exam Vitals and nursing note reviewed.  Constitutional:      General: He is not in acute distress.    Appearance: He is well-developed. He is not ill-appearing.  HENT:     Head: Normocephalic and  atraumatic.     Mouth/Throat:     Mouth: Mucous membranes are moist.  Eyes:     Extraocular Movements: Extraocular movements intact.     Conjunctiva/sclera: Conjunctivae normal.     Pupils: Pupils are equal, round, and reactive to light.  Cardiovascular:     Rate and Rhythm: Normal rate and regular rhythm.     Heart sounds: Normal heart sounds. No murmur heard. Pulmonary:     Effort: Pulmonary effort is normal. No respiratory distress.     Breath sounds: Normal breath sounds.  Abdominal:     Palpations: Abdomen is soft.     Tenderness: There is abdominal tenderness.  Musculoskeletal:        General: No swelling.     Cervical back: Neck supple.  Skin:    General: Skin is warm and dry.     Capillary Refill: Capillary refill takes less than 2  seconds.  Neurological:     General: No focal deficit present.     Mental Status: He is alert.  Psychiatric:        Mood and Affect: Mood normal.     ED Results / Procedures / Treatments   Labs (all labs ordered are listed, but only abnormal results are displayed) Labs Reviewed  COMPREHENSIVE METABOLIC PANEL - Abnormal; Notable for the following components:      Result Value   Potassium 2.9 (*)    Glucose, Bld 231 (*)    Calcium 8.6 (*)    AST 50 (*)    All other components within normal limits  LIPASE, BLOOD  CBC  URINALYSIS, ROUTINE W REFLEX MICROSCOPIC    EKG EKG Interpretation  Date/Time:  Tuesday August 13 2022 19:34:35 EDT Ventricular Rate:  120 PR Interval:  130 QRS Duration: 109 QT Interval:  325 QTC Calculation: 460 R Axis:   -44 Text Interpretation: Sinus tachycardia Incomplete left bundle branch block Confirmed by Lockie Mola, Dmario Russom ([id number]) on 08/13/2022 8:55:01 PM  Radiology CT ABDOMEN PELVIS W CONTRAST  Result Date: 08/13/2022 CLINICAL DATA:  Acute abdominal pain. EXAM: CT ABDOMEN AND PELVIS WITH CONTRAST TECHNIQUE: Multidetector CT imaging of the abdomen and pelvis was performed using the standard protocol following bolus administration of intravenous contrast. RADIATION DOSE REDUCTION: This exam was performed according to the departmental dose-optimization program which includes automated exposure control, adjustment of the mA and/or kV according to patient size and/or use of iterative reconstruction technique. CONTRAST:  OMNIPAQUE IOHEXOL 300 MG/ML  SOLN COMPARISON:  Pelvic CT [id number], chest CT 03/15/2021 reviewed FINDINGS: Lower chest: Chronic interstitial lung disease at the bases. Normal heart size with coronary artery calcifications. Mild wall thickening of the distal esophagus. Hepatobiliary: Diffuse hepatic steatosis. No focal liver abnormality. Clips in the gallbladder fossa postcholecystectomy. No biliary dilatation. Pancreas: No ductal dilatation or  inflammation. Spleen: Normal in size without focal abnormality. Trace perisplenic free fluid. Adrenals/Urinary Tract: No adrenal nodule. No hydronephrosis or perinephric edema. Homogeneous renal enhancement with symmetric excretion on delayed phase imaging. No renal calculi. Simple right renal cyst needs no further imaging follow-up. There is mild diffuse bladder wall thickening questionable enhancement. No definite perivesicular fat stranding. Stomach/Bowel: Mixed liquid and solid stool throughout the colon, equivocal areas of colonic enhancement in the descending colon, for example series 2, image 50. No colonic wall thickening. The appendix is normal. Occasional fluid-filled small bowel without inflammatory change mild wall thickening of the distal esophagus. There is a moderate to large periampullary duodenal diverticulum. Vascular/Lymphatic: Advanced aortic atherosclerosis. No aneurysm. Common  and external iliac arteries are moderately calcified. There is superficial femoral artery appears to be occluded, likely chronic. Portal vein is patent. Multiple calcified lymph nodes in the porta hepatis and upper abdomen, likely sequela of prior granulomatous disease. No noncalcified adenopathy. Reproductive: Prostate is unremarkable. Other: Trace perisplenic free fluid. No free air. No abdominopelvic collection. Soft tissue densities in the anterior abdominal wall typical of medication injection sites. Musculoskeletal: There are no acute or suspicious osseous abnormalities. Mild T11 superior endplate compression fracture appears chronic. There is mild fatty atrophy of the right gluteal musculature. IMPRESSION: 1. Mixed liquid and solid stool throughout the colon, equivocal areas of colonic enhancement in the descending colon. This may represent mild colitis. 2. Mild diffuse bladder wall thickening, possible cystitis. Recommend correlation with urinalysis. 3. Mild wall thickening of the distal esophagus, can be seen  with reflux or esophagitis. 4. Hepatic steatosis. 5. Chronic interstitial lung disease at the bases. 6. Advanced aortic and branch atherosclerosis. Right superficial femoral artery appears to be occluded, likely a chronic finding. Aortic Atherosclerosis (ICD10-I70.0). Electronically Signed   By: Narda Rutherford M.D.   On: 08/13/2022 21:16    Procedures Procedures    Medications Ordered in ED Medications  amoxicillin-clavulanate (AUGMENTIN) 875-125 MG per tablet 1 tablet (has no administration in time range)  fentaNYL (SUBLIMAZE) injection 50 mcg (50 mcg Intravenous Given 08/13/22 2018)  ondansetron (ZOFRAN) injection 4 mg (4 mg Intravenous Given 08/13/22 2017)  iohexol (OMNIPAQUE) 300 MG/ML solution 100 mL (100 mLs Intravenous Contrast Given 08/13/22 2050)  potassium chloride 10 mEq in 100 mL IVPB (0 mEq Intravenous Stopped 08/13/22 2237)  potassium chloride SA (KLOR-CON M) CR tablet 40 mEq (40 mEq Oral Given 08/13/22 2126)  oxymetazoline (AFRIN) 0.05 % nasal spray 1 spray (1 spray Each Nare Given 08/13/22 2157)  sodium chloride 0.9 % bolus 1,000 mL (1,000 mLs Intravenous New Bag/Given 08/13/22 2205)    ED Course/ Medical Decision Making/ A&P                             Medical Decision Making Amount and/or Complexity of Data Reviewed Labs: ordered. Radiology: ordered.  Risk OTC drugs. Prescription drug management.   [name] is here with abdominal pain.  History of COPD, gastroparesis/diabetes, peripheral vascular disease.  Lower abdominal pain for last day or 2 with nausea and vomiting.  Differential diagnosis likely gastroparesis seems less likely bowel obstruction or infectious process but could be colitis.  Will get CBC, CMP, lipase, CT scan abdomen pelvis.  Will give IV fluids, IV fentanyl and reevaluate.  Potassium was 2.9 and this was repleted.  Lab work otherwise unremarkable.  CT scan possibly with some colitis.  Will treat with Augmentin.  Symptoms improved on  reevaluation.  Will refer to gastroenterology as may be some esophagitis seen on CT scan.  Discharged in good condition.  Understands return precautions.  This chart was dictated using voice recognition software.  Despite best efforts to proofread,  errors can occur which can change the documentation meaning.         Final Clinical Impression(s) / ED Diagnoses Final diagnoses:  Colitis    Rx / DC Orders ED Discharge Orders          Ordered    amoxicillin-clavulanate (AUGMENTIN) 875-125 MG tablet  Every 12 hours        08/13/22 2308              Corrine Tillis,  Clearnce Leja, DO 08/13/22 2309

## 2022-08-13 NOTE — ED Triage Notes (Addendum)
Patient brought in from home. EMS was called out for abdominal pain in the BLQ for 3 days. EMS gave normal saline and 4mg  zofran. Stools have been normal.

## 2022-08-13 NOTE — Discharge Instructions (Addendum)
Take next dose of Augmentin tomorrow morning.  Follow-up with primary care doctor and gastroenterology.

## 2022-08-14 ENCOUNTER — Inpatient Hospital Stay: Admission: RE | Admit: 2022-08-14 | Payer: Medicare (Managed Care) | Source: Ambulatory Visit

## 2022-08-19 NOTE — Progress Notes (Deleted)
Patient name: Brian Zavala MRN: [id number] DOB: 12-Jan-1951 Sex: male  REASON FOR CONSULT: Abdominal pain, evaluate for chronic mesenteric ischemia  HPI: Brian Zavala is a 72 y.o. male, with hx COPD, tobacco abuse, PAD s/p right AKA, HTN, HLD, DM resents for evaluation of chronic mesenteric ischemia.  She was recently seen in the ED on 08/13/2022 with lower abdominal pain and emesis x 3.  He underwent a CT scan concerning for mild colitis.  He was treated with antibiotics.  Past Medical History:  Diagnosis Date   Arthritis    "pretty much all over"   Cellulitis of left foot    COPD (chronic obstructive pulmonary disease) (HCC)    Dyspnea    Hyperlipidemia    Hypertension    Neuromuscular disorder (HCC)    neuropathy legs   Neuropathy    legs   Osteomyelitis (HCC) 04/2018   4th toe left foot   Osteomyelitis of fourth toe of left foot (HCC) 04/19/2018   Peripheral vascular disease (HCC)    Type II diabetes mellitus (HCC)    Wears glasses    Wears partial dentures    top and bottom partials    Past Surgical History:  Procedure Laterality Date   ABDOMINAL AORTOGRAM W/LOWER EXTREMITY Bilateral 10/19/2018   Procedure: ABDOMINAL AORTOGRAM W/LOWER EXTREMITY;  Surgeon: [name], MD;  [facility];  Service: Cardiovascular;  Laterality: Bilateral;   ABOVE KNEE LEG AMPUTATION Right 11/27/2018   AMPUTATION Left 04/22/2018   Procedure: LEFT FOOT 4TH RAY AMPUTATION;  Surgeon: [name], MD;  Location: [facility] OR;  Service: Orthopedics;  Laterality: Left;   AMPUTATION Right 11/27/2018   Procedure: RIGHT ABOVE KNEE AMPUTATION;  Surgeon: [name], MD;  Location: [facility] OR;  Service: Orthopedics;  Laterality: Right;   BACK SURGERY     CHOLECYSTECTOMY N/A 10/26/2013   Procedure: LAPAROSCOPIC CHOLECYSTECTOMY WITH INTRAOPERATIVE CHOLANGIOGRAM;  Surgeon: Clovis Pu. Cornett, MD;  [facility];  Service: General;  Laterality: N/A;   COLONOSCOPY     CYST EXCISION  Bilateral 02/15/2015   Procedure: LEFT INDEX FINGER AND RIGHT MIDDLE FINGER NODULE EXCISION;  Surgeon: Tarry Kos, MD;  Location: [facility];  Service: Orthopedics;  Laterality: Bilateral;   CYST EXCISION PERINEAL N/A 12/08/2012   Procedure: CYST EXCISION PERINeum;  Surgeon: Maisie Fus A. Cornett, MD;  Location: [facility];  Service: General;  Laterality: N/A;   FOOT AMPUTATION Right 2005   I & D EXTREMITY Right 03/07/2017   Procedure: EXCISION FIBULAR HEAD RIGHT BELOW KNEE AMPUTATION;  Surgeon: [name], MD;  [facility];  Service: Orthopedics;  Laterality: Right;   INCISE AND DRAIN ABCESS  12/02/2014   PERINEAL ABSCESS   INCISION AND DRAINAGE PERIRECTAL ABSCESS Left 12/02/2014   Procedure: IRRIGATION AND DEBRIDEMENT PERINEAL ABSCESS;  Surgeon: Abigail Miyamoto, MD;  [facility];  Service: General;  Laterality: Left;   LEG AMPUTATION BELOW KNEE Right 2005   LOWER EXTREMITY ANGIOGRAPHY N/A 05/05/2017   Procedure: LOWER EXTREMITY ANGIOGRAPHY;  Surgeon: [name], MD;  [facility];  Service: Cardiovascular;  Laterality: N/A;   LUMBAR DISC SURGERY  82,90   ruptured disc   PERIPHERAL VASCULAR INTERVENTION Right 05/05/2017   Procedure: PERIPHERAL VASCULAR INTERVENTION;  Surgeon: [name], MD;  [facility];  Service: Cardiovascular;  Laterality: Right;   PERIPHERAL VASCULAR INTERVENTION Right 10/19/2018   Procedure: PERIPHERAL VASCULAR INTERVENTION;  Surgeon: Allyson Sabal,  Delton See, MD;  [facility];  Service: Cardiovascular;  Laterality: Right;   SCAR REVISION Right 2005   @ amputation   STUMP REVISION Right 08/21/2018   Procedure: REVISION RIGHT BELOW KNEE AMPUTATION, EXCISION FIBULA;  Surgeon: [name], MD;  [facility];  Service: Orthopedics;  Laterality: Right;   STUMP REVISION Right 09/09/2018   Procedure: REVISION RIGHT BELOW KNEE AMPUTATION;  Surgeon: [name], MD;  Location: [facility] OR;   Service: Orthopedics;  Laterality: Right;   TOE AMPUTATION Right 2005    Family History  Problem Relation Age of Onset   Cirrhosis Father     SOCIAL HISTORY: Social History   Socioeconomic History   Marital status: Divorced    Spouse name: Not on file   Number of children: Not on file   Years of education: Not on file   Highest education level: Not on file  Occupational History   Not on file  Tobacco Use   Smoking status: Every Day    Packs/day: 1.00    Years: 47.00    Additional pack years: 0.00    Total pack years: 47.00    Types: Cigarettes    Start date: 03/04/1965   Smokeless tobacco: Never  Vaping Use   Vaping Use: Never used  Substance and Sexual Activity   Alcohol use: No   Drug use: No   Sexual activity: Not Currently  Other Topics Concern   Not on file  Social History Narrative   Not on file   Social Determinants of Health   Financial Resource Strain: Not on file  Food Insecurity: Not on file  Transportation Needs: Not on file  Physical Activity: Not on file  Stress: Not on file  Social Connections: Not on file  Intimate Partner Violence: Not on file    Allergies  Allergen Reactions   Varenicline Nausea Only    Current Outpatient Medications  Medication Sig Dispense Refill   amoxicillin-clavulanate (AUGMENTIN) 875-125 MG tablet Take 1 tablet by mouth every 12 (twelve) hours for 7 days. 14 tablet 0   aspirin 81 MG chewable tablet Chew 81 mg by mouth daily.     atorvastatin (LIPITOR) 80 MG tablet Take 1 tablet (80 mg total) by mouth daily at 6 PM. 30 tablet 2   calcium carbonate (OSCAL) 1500 (600 Ca) MG TABS tablet Take 600 mg of elemental calcium by mouth daily with breakfast.      cholecalciferol (VITAMIN D) 1000 UNITS tablet Take 1,000 Units by mouth daily.     docusate sodium (COLACE) 100 MG capsule Take 100 mg by mouth 2 (two) times daily.     furosemide (LASIX) 20 MG tablet Take 20 mg by mouth daily.     hydrochlorothiazide (MICROZIDE) 12.5  MG capsule Take 12.5 mg by mouth daily.     leflunomide (ARAVA) 20 MG tablet Take 20 mg by mouth daily.     Liraglutide (VICTOZA) 18 MG/3ML SOLN injection Inject 1.8 mg into the skin every evening.      metFORMIN (GLUCOPHAGE) 1000 MG tablet Take 1,000 mg by mouth 2 (two) times daily.     metoprolol tartrate (LOPRESSOR) 25 MG tablet Take 0.5 tablets (12.5 mg total) by mouth 2 (two) times daily. 60 tablet 0   morphine (MS CONTIN) 100 MG 12 hr tablet Take 100 mg by mouth every 12 (twelve) hours.     oxymetazoline (AFRIN) 0.05 % nasal spray Place 1 spray into both nostrils 2 (two) times daily  as needed for congestion.     predniSONE (DELTASONE) 1 MG tablet Take 2 mg by mouth daily with breakfast.     pregabalin (LYRICA) 75 MG capsule Take 75 mg by mouth See admin instructions. Take 75 mg by mouth in the morning and 150 mg in the afternoon     vitamin B-12 (CYANOCOBALAMIN) 100 MCG tablet Take 100 mcg by mouth daily.     No current facility-administered medications for this visit.    REVIEW OF SYSTEMS:  [X]  denotes positive finding, [ ]  denotes negative finding Cardiac  Comments:  Chest pain or chest pressure: ***   Shortness of breath upon exertion:    Short of breath when lying flat:    Irregular heart rhythm:        Vascular    Pain in calf, thigh, or hip brought on by ambulation:    Pain in feet at night that wakes you up from your sleep:     Blood clot in your veins:    Leg swelling:         Pulmonary    Oxygen at home:    Productive cough:     Wheezing:         Neurologic    Sudden weakness in arms or legs:     Sudden numbness in arms or legs:     Sudden onset of difficulty speaking or slurred speech:    Temporary loss of vision in one eye:     Problems with dizziness:         Gastrointestinal    Blood in stool:     Vomited blood:         Genitourinary    Burning when urinating:     Blood in urine:        Psychiatric    Major depression:         Hematologic     Bleeding problems:    Problems with blood clotting too easily:        Skin    Rashes or ulcers:        Constitutional    Fever or chills:      PHYSICAL EXAM: There were no vitals filed for this visit.  GENERAL: The patient is a well-nourished male, in no acute distress. The vital signs are documented above. CARDIAC: There is a regular rate and rhythm.  VASCULAR: *** PULMONARY: There is good air exchange bilaterally without wheezing or rales. ABDOMEN: Soft and non-tender with normal pitched bowel sounds.  MUSCULOSKELETAL: There are no major deformities or cyanosis. NEUROLOGIC: No focal weakness or paresthesias are detected. SKIN: There are no ulcers or rashes noted. PSYCHIATRIC: The patient has a normal affect.  DATA:   ***  Assessment/Plan:  ***   Cephus Shelling, MD [facility] Office: [phone]

## 2022-08-20 ENCOUNTER — Ambulatory Visit: Payer: Medicare (Managed Care) | Admitting: Vascular Surgery

## 2022-08-20 ENCOUNTER — Observation Stay (HOSPITAL_COMMUNITY)
Admission: EM | Admit: 2022-08-20 | Discharge: 2022-08-21 | Disposition: A | Discharge status: Home / Self Care | Payer: Medicare (Managed Care) | Attending: Family Medicine | Admitting: Family Medicine

## 2022-08-20 ENCOUNTER — Emergency Department (HOSPITAL_COMMUNITY): Payer: Medicare (Managed Care)

## 2022-08-20 ENCOUNTER — Other Ambulatory Visit: Payer: Self-pay

## 2022-08-20 ENCOUNTER — Encounter (HOSPITAL_COMMUNITY): Payer: Self-pay

## 2022-08-20 DIAGNOSIS — Z79899 Other long term (current) drug therapy: Secondary | ICD-10-CM | POA: Insufficient documentation

## 2022-08-20 DIAGNOSIS — Z87891 Personal history of nicotine dependence: Secondary | ICD-10-CM | POA: Insufficient documentation

## 2022-08-20 DIAGNOSIS — R112 Nausea with vomiting, unspecified: Principal | ICD-10-CM | POA: Insufficient documentation

## 2022-08-20 DIAGNOSIS — N390 Urinary tract infection, site not specified: Secondary | ICD-10-CM | POA: Diagnosis not present

## 2022-08-20 DIAGNOSIS — R110 Nausea: Secondary | ICD-10-CM | POA: Diagnosis not present

## 2022-08-20 DIAGNOSIS — R109 Unspecified abdominal pain: Secondary | ICD-10-CM

## 2022-08-20 DIAGNOSIS — M069 Rheumatoid arthritis, unspecified: Secondary | ICD-10-CM

## 2022-08-20 DIAGNOSIS — Z7982 Long term (current) use of aspirin: Secondary | ICD-10-CM | POA: Diagnosis not present

## 2022-08-20 DIAGNOSIS — K529 Noninfective gastroenteritis and colitis, unspecified: Secondary | ICD-10-CM | POA: Diagnosis not present

## 2022-08-20 DIAGNOSIS — I10 Essential (primary) hypertension: Secondary | ICD-10-CM | POA: Diagnosis not present

## 2022-08-20 DIAGNOSIS — J449 Chronic obstructive pulmonary disease, unspecified: Secondary | ICD-10-CM | POA: Insufficient documentation

## 2022-08-20 DIAGNOSIS — Z794 Long term (current) use of insulin: Secondary | ICD-10-CM | POA: Diagnosis not present

## 2022-08-20 DIAGNOSIS — E1140 Type 2 diabetes mellitus with diabetic neuropathy, unspecified: Secondary | ICD-10-CM | POA: Diagnosis not present

## 2022-08-20 DIAGNOSIS — Z89519 Acquired absence of unspecified leg below knee: Secondary | ICD-10-CM

## 2022-08-20 DIAGNOSIS — I2510 Atherosclerotic heart disease of native coronary artery without angina pectoris: Secondary | ICD-10-CM | POA: Insufficient documentation

## 2022-08-20 DIAGNOSIS — I739 Peripheral vascular disease, unspecified: Secondary | ICD-10-CM | POA: Diagnosis present

## 2022-08-20 DIAGNOSIS — N3000 Acute cystitis without hematuria: Secondary | ICD-10-CM | POA: Diagnosis not present

## 2022-08-20 DIAGNOSIS — Z720 Tobacco use: Secondary | ICD-10-CM

## 2022-08-20 DIAGNOSIS — G8929 Other chronic pain: Secondary | ICD-10-CM

## 2022-08-20 HISTORY — DX: Nausea with vomiting, unspecified: R11.2

## 2022-08-20 LAB — CBC WITH DIFFERENTIAL/PLATELET
Abs Immature Granulocytes: 0.04 10*3/uL (ref 0.00–0.07)
Basophils Absolute: 0.1 10*3/uL (ref 0.0–0.1)
Basophils Relative: 1 %
Eosinophils Absolute: 0.3 10*3/uL (ref 0.0–0.5)
Eosinophils Relative: 4 %
HCT: 46.7 % (ref 39.0–52.0)
Hemoglobin: 14.8 g/dL (ref 13.0–17.0)
Immature Granulocytes: 1 %
Lymphocytes Relative: 13 %
Lymphs Abs: 1.1 10*3/uL (ref 0.7–4.0)
MCH: 29 pg (ref 26.0–34.0)
MCHC: 31.7 g/dL (ref 30.0–36.0)
MCV: 91.4 fL (ref 80.0–100.0)
Monocytes Absolute: 0.6 10*3/uL (ref 0.1–1.0)
Monocytes Relative: 7 %
Neutro Abs: 6.1 10*3/uL (ref 1.7–7.7)
Neutrophils Relative %: 74 %
Platelets: 258 10*3/uL (ref 150–400)
RBC: 5.11 MIL/uL (ref 4.22–5.81)
RDW: 14.5 % (ref 11.5–15.5)
WBC: 8.2 10*3/uL (ref 4.0–10.5)
nRBC: 0 % (ref 0.0–0.2)

## 2022-08-20 LAB — COMPREHENSIVE METABOLIC PANEL
ALT: 31 U/L (ref 0–44)
AST: 37 U/L (ref 15–41)
Albumin: 3.4 g/dL — ABNORMAL LOW (ref 3.5–5.0)
Alkaline Phosphatase: 58 U/L (ref 38–126)
Anion gap: 13 (ref 5–15)
BUN: 8 mg/dL (ref 8–23)
CO2: 23 mmol/L (ref 22–32)
Calcium: 9.1 mg/dL (ref 8.9–10.3)
Chloride: 98 mmol/L (ref 98–111)
Creatinine, Ser: 0.71 mg/dL (ref 0.61–1.24)
GFR, Estimated: >60 mL/min (ref >60)
Glucose, Bld: 201 mg/dL — ABNORMAL HIGH (ref 70–99)
Potassium: 4 mmol/L (ref 3.5–5.1)
Sodium: 134 mmol/L — ABNORMAL LOW (ref 135–145)
Total Bilirubin: 0.8 mg/dL (ref 0.3–1.2)
Total Protein: 6.7 g/dL (ref 6.5–8.1)

## 2022-08-20 LAB — GLUCOSE, CAPILLARY
Glucose-Capillary: 144 mg/dL — ABNORMAL HIGH (ref 70–99)
Glucose-Capillary: 252 mg/dL — ABNORMAL HIGH (ref 70–99)

## 2022-08-20 LAB — LIPASE, BLOOD: Lipase: 53 U/L — ABNORMAL HIGH (ref 11–51)

## 2022-08-20 MED ORDER — ONDANSETRON HCL 4 MG/2ML IJ SOLN: 4.0000 mg | Freq: Four times a day (QID) | INTRAMUSCULAR | Status: DC | PRN

## 2022-08-20 MED ORDER — SODIUM CHLORIDE 0.9% FLUSH
3.0000 mL | Freq: Two times a day (BID) | INTRAVENOUS | Status: DC
  Administered 2022-08-20 – 2022-08-21 (×3): 3 mL via INTRAVENOUS

## 2022-08-20 MED ORDER — NICOTINE 21 MG/24HR TD PT24
21.0000 mg | MEDICATED_PATCH | Freq: Every day | TRANSDERMAL | Status: DC
  Administered 2022-08-20 – 2022-08-21 (×2): 21 mg via TRANSDERMAL
  Filled 2022-08-20 (×2): qty 1

## 2022-08-20 MED ORDER — MORPHINE SULFATE (PF) 4 MG/ML IV SOLN
4.0000 mg | Freq: Once | INTRAVENOUS | Status: AC
  Administered 2022-08-20: 4 mg via INTRAVENOUS
  Filled 2022-08-20: qty 1

## 2022-08-20 MED ORDER — ASPIRIN 81 MG PO CHEW
81.0000 mg | CHEWABLE_TABLET | Freq: Every day | ORAL | Status: DC
  Administered 2022-08-21: 81 mg via ORAL
  Filled 2022-08-20: qty 1

## 2022-08-20 MED ORDER — SALINE SPRAY 0.65 % NA SOLN: 1.0000 | Freq: Four times a day (QID) | NASAL | Status: DC | PRN

## 2022-08-20 MED ORDER — HYDROCERIN EX CREA
1.0000 | TOPICAL_CREAM | Freq: Two times a day (BID) | CUTANEOUS | Status: DC
  Administered 2022-08-20: 1 via TOPICAL
  Filled 2022-08-20: qty 113

## 2022-08-20 MED ORDER — METOPROLOL SUCCINATE ER 25 MG PO TB24
25.0000 mg | ORAL_TABLET | Freq: Every evening | ORAL | Status: DC
  Administered 2022-08-20: 25 mg via ORAL
  Filled 2022-08-20: qty 1

## 2022-08-20 MED ORDER — ATORVASTATIN CALCIUM 80 MG PO TABS: 80.0000 mg | ORAL_TABLET | Freq: Every day | ORAL | Status: DC

## 2022-08-20 MED ORDER — MORPHINE SULFATE ER 100 MG PO TBCR
100.0000 mg | EXTENDED_RELEASE_TABLET | Freq: Two times a day (BID) | ORAL | Status: DC
  Administered 2022-08-20 – 2022-08-21 (×2): 100 mg via ORAL
  Filled 2022-08-20 (×2): qty 1

## 2022-08-20 MED ORDER — LEFLUNOMIDE 10 MG PO TABS
20.0000 mg | ORAL_TABLET | Freq: Every day | ORAL | Status: DC
  Administered 2022-08-21: 20 mg via ORAL
  Filled 2022-08-20: qty 2

## 2022-08-20 MED ORDER — INSULIN ASPART 100 UNIT/ML IJ SOLN
0.0000 [IU] | Freq: Three times a day (TID) | INTRAMUSCULAR | Status: DC
  Administered 2022-08-21: 11 [IU] via SUBCUTANEOUS
  Administered 2022-08-21: 3 [IU] via SUBCUTANEOUS

## 2022-08-20 MED ORDER — METRONIDAZOLE 500 MG/100ML IV SOLN
500.0000 mg | Freq: Two times a day (BID) | INTRAVENOUS | Status: DC
  Administered 2022-08-20 – 2022-08-21 (×2): 500 mg via INTRAVENOUS
  Filled 2022-08-20 (×2): qty 100

## 2022-08-20 MED ORDER — IOHEXOL 350 MG/ML SOLN
100.0000 mL | Freq: Once | INTRAVENOUS | Status: AC | PRN
  Administered 2022-08-20: 100 mL via INTRAVENOUS

## 2022-08-20 MED ORDER — ACETAMINOPHEN 650 MG RE SUPP: 650.0000 mg | Freq: Four times a day (QID) | RECTAL | Status: DC | PRN

## 2022-08-20 MED ORDER — PREDNISONE 1 MG PO TABS
2.0000 mg | ORAL_TABLET | Freq: Every day | ORAL | Status: DC
  Administered 2022-08-21: 2 mg via ORAL
  Filled 2022-08-20 (×3): qty 2

## 2022-08-20 MED ORDER — INSULIN GLARGINE-YFGN 100 UNIT/ML ~~LOC~~ SOLN
60.0000 [IU] | Freq: Every day | SUBCUTANEOUS | Status: DC
  Filled 2022-08-20: qty 0.6

## 2022-08-20 MED ORDER — SODIUM CHLORIDE 0.9 % IV SOLN: INTRAVENOUS | Status: DC

## 2022-08-20 MED ORDER — ONDANSETRON HCL 4 MG/2ML IJ SOLN
4.0000 mg | Freq: Once | INTRAMUSCULAR | Status: AC
  Administered 2022-08-20: 4 mg via INTRAVENOUS
  Filled 2022-08-20: qty 2

## 2022-08-20 MED ORDER — SODIUM CHLORIDE 0.9 % IV SOLN
1.0000 g | INTRAVENOUS | Status: DC
  Administered 2022-08-20: 1 g via INTRAVENOUS
  Filled 2022-08-20: qty 10

## 2022-08-20 MED ORDER — ACETAMINOPHEN 325 MG PO TABS: 650.0000 mg | ORAL_TABLET | Freq: Four times a day (QID) | ORAL | Status: DC | PRN

## 2022-08-20 MED ORDER — INSULIN GLARGINE-YFGN 100 UNIT/ML ~~LOC~~ SOLN
60.0000 [IU] | Freq: Every day | SUBCUTANEOUS | Status: DC
  Administered 2022-08-21: 60 [IU] via SUBCUTANEOUS
  Filled 2022-08-20: qty 0.6

## 2022-08-20 MED ORDER — PREGABALIN 75 MG PO CAPS
150.0000 mg | ORAL_CAPSULE | Freq: Two times a day (BID) | ORAL | Status: DC
  Administered 2022-08-20 – 2022-08-21 (×2): 150 mg via ORAL
  Filled 2022-08-20 (×2): qty 2

## 2022-08-20 MED ORDER — MORPHINE SULFATE (PF) 2 MG/ML IV SOLN
2.0000 mg | INTRAVENOUS | Status: DC | PRN
  Administered 2022-08-20: 2 mg via INTRAVENOUS
  Filled 2022-08-20: qty 1

## 2022-08-20 MED ORDER — ALBUTEROL SULFATE (2.5 MG/3ML) 0.083% IN NEBU: 2.5000 mg | INHALATION_SOLUTION | Freq: Four times a day (QID) | RESPIRATORY_TRACT | Status: DC | PRN

## 2022-08-20 MED ORDER — ENOXAPARIN SODIUM 40 MG/0.4ML IJ SOSY
40.0000 mg | PREFILLED_SYRINGE | INTRAMUSCULAR | Status: DC
  Administered 2022-08-20: 40 mg via SUBCUTANEOUS
  Filled 2022-08-20: qty 0.4

## 2022-08-20 MED ORDER — SODIUM CHLORIDE 0.9 % IV BOLUS
1000.0000 mL | Freq: Once | INTRAVENOUS | Status: AC
  Administered 2022-08-20: 1000 mL via INTRAVENOUS

## 2022-08-20 MED ORDER — HYDROCHLOROTHIAZIDE 12.5 MG PO TABS
12.5000 mg | ORAL_TABLET | Freq: Every day | ORAL | Status: DC
  Administered 2022-08-21: 12.5 mg via ORAL
  Filled 2022-08-20: qty 1

## 2022-08-20 MED ORDER — ONDANSETRON HCL 4 MG PO TABS: 4.0000 mg | ORAL_TABLET | Freq: Four times a day (QID) | ORAL | Status: DC | PRN

## 2022-08-20 NOTE — ED Notes (Signed)
ED TO INPATIENT HANDOFF REPORT  ED Nurse Name and Phone #: [phone]  S Name/Age/Gender Brian Zavala 72 y.o. male Room/Bed: 031C/031C  Code Status   Code Status: Prior  Home/SNF/Other Home Patient oriented to: self, place, time, and situation Is this baseline? Yes   Triage Complete: Triage complete  Chief Complaint Nausea and vomiting [R11.2]  Triage Note Pt to the ed from home with a CC of abd pain x 3 days. Pt was dxed with colitis on Sunday in the ed and given abx. Pt relays he stopped taking abx because it hurt his stomach even more. Pt relays nausea has increased. Pt denies fever, chills, cp, sob, dizziness.    Allergies Allergies  Allergen Reactions   Varenicline Nausea Only    Level of Care/Admitting Diagnosis ED Disposition     ED Disposition  Admit   Condition  --   Comment  Hospital Area: [facility] [[id number]]  Level of Care: Telemetry Medical [104]  May place patient in observation at [facility] or [facility] if equivalent level of care is available:: No  Covid Evaluation: Asymptomatic - no recent exposure (last 10 days) testing not required  Diagnosis: Nausea and vomiting [744752]  Admitting Physician: [name] [[id number]]  Attending Physician: [name] [[id number]]          B Medical/Surgery History Past Medical History:  Diagnosis Date   Arthritis    "pretty much all over"   Cellulitis of left foot    COPD (chronic obstructive pulmonary disease) (HCC)    Dyspnea    Hyperlipidemia    Hypertension    Nausea and vomiting 08/20/2022   Neuromuscular disorder (HCC)    neuropathy legs   Neuropathy    legs   Osteomyelitis (HCC) 04/2018   4th toe left foot   Osteomyelitis of fourth toe of left foot (HCC) 04/19/2018   Peripheral vascular disease (HCC)    Type II diabetes mellitus (HCC)    Wears glasses    Wears partial dentures    top and bottom partials   Past Surgical History:  Procedure Laterality Date    ABDOMINAL AORTOGRAM W/LOWER EXTREMITY Bilateral 10/19/2018   Procedure: ABDOMINAL AORTOGRAM W/LOWER EXTREMITY;  Surgeon: [name], MD;  [facility];  Service: Cardiovascular;  Laterality: Bilateral;   ABOVE KNEE LEG AMPUTATION Right 11/27/2018   AMPUTATION Left 04/22/2018   Procedure: LEFT FOOT 4TH RAY AMPUTATION;  Surgeon: [name], MD;  Location: [facility] OR;  Service: Orthopedics;  Laterality: Left;   AMPUTATION Right 11/27/2018   Procedure: RIGHT ABOVE KNEE AMPUTATION;  Surgeon: [name], MD;  Location: [facility] OR;  Service: Orthopedics;  Laterality: Right;   BACK SURGERY     CHOLECYSTECTOMY N/A 10/26/2013   Procedure: LAPAROSCOPIC CHOLECYSTECTOMY WITH INTRAOPERATIVE CHOLANGIOGRAM;  Surgeon: Clovis Pu. Cornett, MD;  [facility];  Service: General;  Laterality: N/A;   COLONOSCOPY     CYST EXCISION Bilateral 02/15/2015   Procedure: LEFT INDEX FINGER AND RIGHT MIDDLE FINGER NODULE EXCISION;  Surgeon: Tarry Kos, MD;  Location: [facility];  Service: Orthopedics;  Laterality: Bilateral;   CYST EXCISION PERINEAL N/A 12/08/2012   Procedure: CYST EXCISION PERINeum;  Surgeon: Maisie Fus A. Cornett, MD;  Location: [facility];  Service: General;  Laterality: N/A;   FOOT AMPUTATION Right 2005   I & D EXTREMITY Right 03/07/2017   Procedure: EXCISION FIBULAR HEAD RIGHT BELOW KNEE AMPUTATION;  Surgeon: [name],  MD;  [facility];  Service: Orthopedics;  Laterality: Right;   INCISE AND DRAIN ABCESS  12/02/2014   PERINEAL ABSCESS   INCISION AND DRAINAGE PERIRECTAL ABSCESS Left 12/02/2014   Procedure: IRRIGATION AND DEBRIDEMENT PERINEAL ABSCESS;  Surgeon: Abigail Miyamoto, MD;  [facility];  Service: General;  Laterality: Left;   LEG AMPUTATION BELOW KNEE Right 2005   LOWER EXTREMITY ANGIOGRAPHY N/A 05/05/2017   Procedure: LOWER EXTREMITY ANGIOGRAPHY;  Surgeon: [name], MD;  [facility];  Service: Cardiovascular;   Laterality: N/A;   LUMBAR DISC SURGERY  82,90   ruptured disc   PERIPHERAL VASCULAR INTERVENTION Right 05/05/2017   Procedure: PERIPHERAL VASCULAR INTERVENTION;  Surgeon: [name], MD;  [facility];  Service: Cardiovascular;  Laterality: Right;   PERIPHERAL VASCULAR INTERVENTION Right 10/19/2018   Procedure: PERIPHERAL VASCULAR INTERVENTION;  Surgeon: [name], MD;  [facility];  Service: Cardiovascular;  Laterality: Right;   SCAR REVISION Right 2005   @ amputation   STUMP REVISION Right 08/21/2018   Procedure: REVISION RIGHT BELOW KNEE AMPUTATION, EXCISION FIBULA;  Surgeon: [name], MD;  [facility];  Service: Orthopedics;  Laterality: Right;   STUMP REVISION Right 09/09/2018   Procedure: REVISION RIGHT BELOW KNEE AMPUTATION;  Surgeon: [name], MD;  Location: [facility] OR;  Service: Orthopedics;  Laterality: Right;   TOE AMPUTATION Right 2005     A IV Location/Drains/Wounds Patient Lines/Drains/Airways Status     Active Line/Drains/Airways     Name Placement date Placement time Site Days   Peripheral IV 08/20/22 20 G Anterior;Right Forearm 08/20/22  0813  Forearm  less than 1   PICC Single Lumen  PICC Right Basilic --  --  Basilic  --   Negative Pressure Wound Therapy Leg Right 08/21/18  0845  --  1460   Negative Pressure Wound Therapy Leg Right 09/09/18  1403  --  1441   Negative Pressure Wound Therapy Leg Right 11/27/18  1220  --  1362   Wound / Incision (Open or Dehisced) 10/19/18 Incision - Open Leg Right  10/19/18  1645  Leg  1401            Intake/Output Last 24 hours No intake or output data in the 24 hours ending 08/20/22 1341  Labs/Imaging Results for orders placed or performed during the hospital encounter of 08/20/22 (from the past 48 hour(s))  CBC with Differential     Status: None   Collection Time: 08/20/22  7:54 AM  Result Value Ref Range   WBC 8.2 4.0 - 10.5 K/uL   RBC 5.11 4.22 - 5.81 MIL/uL    Hemoglobin 14.8 13.0 - 17.0 g/dL   HCT 16.1 09.6 - 04.5 %   MCV 91.4 80.0 - 100.0 fL   MCH 29.0 26.0 - 34.0 pg   MCHC 31.7 30.0 - 36.0 g/dL   RDW 40.9 81.1 - 91.4 %   Platelets 258 150 - 400 K/uL   nRBC 0.0 0.0 - 0.2 %   Neutrophils Relative % 74 %   Neutro Abs 6.1 1.7 - 7.7 K/uL   Lymphocytes Relative 13 %   Lymphs Abs 1.1 0.7 - 4.0 K/uL   Monocytes Relative 7 %   Monocytes Absolute 0.6 0.1 - 1.0 K/uL   Eosinophils Relative 4 %   Eosinophils Absolute 0.3 0.0 - 0.5 K/uL   Basophils Relative 1 %   Basophils Absolute 0.1 0.0 - 0.1 K/uL  Immature Granulocytes 1 %   Abs Immature Granulocytes 0.04 0.00 - 0.07 K/uL    Comment: Performed at [facility], [address] [place], [place] [address]  Comprehensive metabolic panel     Status: Abnormal   Collection Time: 08/20/22  7:54 AM  Result Value Ref Range   Sodium 134 (L) 135 - 145 mmol/L   Potassium 4.0 3.5 - 5.1 mmol/L   Chloride 98 98 - 111 mmol/L   CO2 23 22 - 32 mmol/L   Glucose, Bld 201 (H) 70 - 99 mg/dL    Comment: Glucose reference range applies only to samples taken after fasting for at least 8 hours.   BUN 8 8 - 23 mg/dL   Creatinine, Ser 6.04 0.61 - 1.24 mg/dL   Calcium 9.1 8.9 - 54.0 mg/dL   Total Protein 6.7 6.5 - 8.1 g/dL   Albumin 3.4 (L) 3.5 - 5.0 g/dL   AST 37 15 - 41 U/L   ALT 31 0 - 44 U/L   Alkaline Phosphatase 58 38 - 126 U/L   Total Bilirubin 0.8 0.3 - 1.2 mg/dL   GFR, Estimated >98 >11 mL/min    Comment: (NOTE) Calculated using the CKD-EPI Creatinine Equation (2021)    Anion gap 13 5 - 15    Comment: Performed at [facility], [address] [place], [place] [address]  Lipase, blood     Status: Abnormal   Collection Time: 08/20/22  7:54 AM  Result Value Ref Range   Lipase 53 (H) 11 - 51 U/L    Comment: Performed at [facility], [address] [place], [place] [address]   CT Angio Abd/Pel W and/or Wo Contrast  Result Date: 08/20/2022 CLINICAL DATA:  Recent colitis worsening  over the last 3 days. EXAM: CTA ABDOMEN AND PELVIS WITHOUT AND WITH CONTRAST TECHNIQUE: Multidetector CT imaging of the abdomen and pelvis was performed using the standard protocol during bolus administration of intravenous contrast. Multiplanar reconstructed images and MIPs were obtained and reviewed to evaluate the vascular anatomy. RADIATION DOSE REDUCTION: This exam was performed according to the departmental dose-optimization program which includes automated exposure control, adjustment of the mA and/or kV according to patient size and/or use of iterative reconstruction technique. CONTRAST:  OMNIPAQUE IOHEXOL 350 MG/ML SOLN COMPARISON:  Standard contrast CT 08/13/2022. FINDINGS: VASCULAR Aorta: Moderate calcified and noncalcified plaque throughout the abdominal aorta without dissection or aneurysm formation. Celiac: Standard branching pattern. Mild calcified plaque at the origin of the celiac. SMA: Mild calcified plaque at the origin of the SMA. Renals: Single bilateral main renal arteries. There is some calcified plaque at the origin of both vessels. Up to 50% stenosis on the left. Minimal on the right. IMA: Patent without evidence of aneurysm, dissection, vasculitis or significant stenosis. Inflow: Advanced partially calcified plaque along the iliac vessels with the areas of significant stenosis along the common iliac arteries. Patent lumen narrows to as small as 2.5 mm on the left and similar on the right. Please correlate for any evidence of symptoms including claudication. There is significant, severe calcified plaque along the internal iliac vessels and moderate severe along the external iliac is diffusely also with moderate areas of stenosis. Proximal Outflow: Calcified plaque also seen along the common femoral arteries with areas of significant stenosis greatest involving the proximal left common femoral artery with a severe stenosis. The right SFA is occluded proximally. Veins: No obvious venous  abnormality within the limitations of this arterial  phase study. Review of the MIP images confirms the above findings. NON-VASCULAR Lower chest: Once again changes of chronic interstitial lung disease with peripheral areas of fibrosis with some bronchiectasis and honeycombing. No pleural effusion. Hepatobiliary: No focal liver abnormality is seen. Status post cholecystectomy. No biliary dilatation. Pancreas: Unremarkable. No pancreatic ductal dilatation or surrounding inflammatory changes. Spleen: Normal in size without focal abnormality. Adrenals/Urinary Tract: Adrenal glands are preserved. No enhancing renal mass or collecting system filling defect. Once again there is some small Bosniak 1 and 2 renal lesions, no specific imaging follow-up. Stomach/Bowel: No oral contrast. Large second portion duodenal diverticulum. The stomach is nondilated. Large bowel is of normal course and caliber with moderate colonic stool. Normal appendix. Distal ileal small bowel stool appearance. On the prior examination there was a subtle area of wall thickening along the distal descending colon. This area is less apparent today. This loop of bowel is nondilated. Lymphatic: Calcified lymph nodes in the upper abdomen. No other areas of specific lymph node enlargement. Reproductive: Prostate is unremarkable. Other: No free air or free fluid. Stable areas of wall thickening along the subcutaneous fat along the left side anteriorly. Musculoskeletal: Advanced degenerative changes of the spine and pelvis. Trace anterolisthesis of L3 on L4. Laminectomy changes at L5. Partial sacralization of the left side of L5. IMPRESSION: VASCULAR Extensive calcified plaque along the aorta and iliac vessels with areas of significant high-grade stenosis along the iliac vessels. There is occlusion of the right SFA and high-grade stenosis on the left. Please correlate with particular symptoms. Minimal calcified plaque along the celiac, SMA and IMA. NON-VASCULAR  Previous areas subtle wall thickening along the distal descending colon is less apparent today. No obstruction, free air or free fluid. Scattered colonic stool. Changes again of chronic interstitial lung disease at the lung bases. Stable mild wall thickening of the distal esophagus. Electronically Signed   By: Karen Kays M.D.   On: 08/20/2022 11:55    Pending Labs Unresulted Labs (From admission, onward)     Start     Ordered   08/20/22 0735  Urinalysis, Routine w reflex microscopic -Urine, Clean Catch  (ED Abdominal Pain)  Once,   URGENT       Question:  Specimen Source  Answer:  Urine, Clean Catch   08/20/22 0735            Vitals/Pain Today's Vitals   08/20/22 0859 08/20/22 1030 08/20/22 1215 08/20/22 1253  BP:   126/84   Pulse:  100 96   Resp:  (!) 23 13   Temp: (!) 97.1 F (36.2 C)   98.1 F (36.7 C)  TempSrc: Oral     SpO2:  93% 96%   Weight:      Height:        Isolation Precautions No active isolations  Medications Medications  sodium chloride 0.9 % bolus 1,000 mL (1,000 mLs Intravenous New Bag/Given 08/20/22 0826)  ondansetron (ZOFRAN) injection 4 mg (4 mg Intravenous Given 08/20/22 0826)  morphine (PF) 4 MG/ML injection 4 mg (4 mg Intravenous Given 08/20/22 0826)  iohexol (OMNIPAQUE) 350 MG/ML injection 100 mL (100 mLs Intravenous Contrast Given 08/20/22 0916)  morphine (PF) 4 MG/ML injection 4 mg (4 mg Intravenous Given 08/20/22 1319)    Mobility walks with device     Focused Assessments     R Recommendations: See Admitting Provider Note  Report given to:   Additional Notes:

## 2022-08-20 NOTE — Progress Notes (Signed)
Patient transferred from ED via bed at 1600 PM. Alert and oriented. Will continue to monitor.

## 2022-08-20 NOTE — Consult Note (Signed)
WOC Nurse Consult Note: Reason for Consult:skin tear to LLE Wound type:trauma Pressure Injury POA: NA Measurement:Bedside RN to measure and document measurements on Nursing flow Sheet with next dressing application Wound bed:dry Drainage (amount, consistency, odor) scant serosanguinous, dried Periwound:intact Dressing procedure/placement/frequency:I have provided Nursing with conservative management strategies for the tissue repair and regeneration at the site of the injury using a daily saline cleanse followed by covering with an antimicrobial nonadherent (xeroform) gauze topped with dry gauze and secured with silicone foam.   WOC nursing team will not follow, but will remain available to this patient, the nursing and medical teams.  Please re-consult if needed.  Thank you for inviting [place] to participate in this patient's Plan of Care.  Ladona Mow, MSN, RN, CNS, GNP, Leda Min, [employer], [employer] phone:  [phone]

## 2022-08-20 NOTE — H&P (Addendum)
History and Physical    Patient: Brian Zavala:[id number] DOB: August 31, 1950 DOA: 08/20/2022 DOS: the patient was seen and examined on 08/20/2022 PCP: Pcp, No  Patient coming from: Home  Chief Complaint:  Chief Complaint  Patient presents with   Abdominal Pain   HPI: Brian Zavala is a 72 y.o. male with medical history significant of hypertension, hyperlipidemia, diabetes mellitus type 2, PVD s/p right AKA, rheumatoid arthritis, who presents with complaints of abdominal pain.  History is obtained from the patient with assistance of his mother and sister.  Apparently patient has been having symptoms for approximately 5 years, but have been less frequent and less severe.  Patient describes having a twisting pain in the middle of the stomach with nausea causes him to break out into a cold sweat.  Over the last 2 weeks episodes have become more frequent and more severe.  Denies having any episodes of vomiting, but had had decreased p.o. intake.  Symptoms previously have been occurring usually in the afternoon and to his knowledge had no association with eating.  He did report having loose stools the other day. Patient had initially been seen on 6/11 for symptoms and diagnosed with colitis. Urinalysis at that time also showed possible concern for UTI.  He had been discharged home with a prescription for Augmentin.  He tried taking the medication for a week but stopped it yesterday as seem to make his symptoms worse.  His sister notes that normally she could give him oral Phenergan with some improvement in nausea symptoms, but she had even been giving it to him IM without improvement.  In the emergency department patient was noted to be afebrile with mild tachycardia and tachypnea and all other vital signs maintained.  Labs sodium 134, glucose 201, and lipase 53.  CT angiogram of the abdomen and pelvis with contrast noted extensive calcified plaque along the aorta and iliac vessels with significant  high-grade stenosis along the iliac vessels, occlusion of the right SFA and high-grade stenosis on the left, subtle wall thickening along the distal descending colon less apparent than prior study done from 6/11.  Review of Systems: As mentioned in the history of present illness. All other systems reviewed and are negative. Past Medical History:  Diagnosis Date   Arthritis    "pretty much all over"   Cellulitis of left foot    COPD (chronic obstructive pulmonary disease) (HCC)    Dyspnea    Hyperlipidemia    Hypertension    Nausea and vomiting 08/20/2022   Neuromuscular disorder (HCC)    neuropathy legs   Neuropathy    legs   Osteomyelitis (HCC) 04/2018   4th toe left foot   Osteomyelitis of fourth toe of left foot (HCC) 04/19/2018   Peripheral vascular disease (HCC)    Type II diabetes mellitus (HCC)    Wears glasses    Wears partial dentures    top and bottom partials   Past Surgical History:  Procedure Laterality Date   ABDOMINAL AORTOGRAM W/LOWER EXTREMITY Bilateral 10/19/2018   Procedure: ABDOMINAL AORTOGRAM W/LOWER EXTREMITY;  Surgeon: [name], MD;  Location: [facility];  Service: Cardiovascular;  Laterality: Bilateral;   ABOVE KNEE LEG AMPUTATION Right 11/27/2018   AMPUTATION Left 04/22/2018   Procedure: LEFT FOOT 4TH RAY AMPUTATION;  Surgeon: [name], MD;  Location: [facility] OR;  Service: Orthopedics;  Laterality: Left;   AMPUTATION Right 11/27/2018   Procedure: RIGHT ABOVE KNEE AMPUTATION;  Surgeon: [name]  V, MD;  Location: [facility] OR;  Service: Orthopedics;  Laterality: Right;   BACK SURGERY     CHOLECYSTECTOMY N/A 10/26/2013   Procedure: LAPAROSCOPIC CHOLECYSTECTOMY WITH INTRAOPERATIVE CHOLANGIOGRAM;  Surgeon: Clovis Pu. Cornett, MD;  Location: [facility] OR;  Service: General;  Laterality: N/A;   COLONOSCOPY     CYST EXCISION Bilateral 02/15/2015   Procedure: LEFT INDEX FINGER AND RIGHT MIDDLE FINGER NODULE EXCISION;  Surgeon: Tarry Kos, MD;  Location:  [facility];  Service: Orthopedics;  Laterality: Bilateral;   CYST EXCISION PERINEAL N/A 12/08/2012   Procedure: CYST EXCISION PERINeum;  Surgeon: Maisie Fus A. Cornett, MD;  Location: [facility];  Service: General;  Laterality: N/A;   FOOT AMPUTATION Right 2005   I & D EXTREMITY Right 03/07/2017   Procedure: EXCISION FIBULAR HEAD RIGHT BELOW KNEE AMPUTATION;  Surgeon: [name], MD;  Location: [facility];  Service: Orthopedics;  Laterality: Right;   INCISE AND DRAIN ABCESS  12/02/2014   PERINEAL ABSCESS   INCISION AND DRAINAGE PERIRECTAL ABSCESS Left 12/02/2014   Procedure: IRRIGATION AND DEBRIDEMENT PERINEAL ABSCESS;  Surgeon: Abigail Miyamoto, MD;  Location: [facility];  Service: General;  Laterality: Left;   LEG AMPUTATION BELOW KNEE Right 2005   LOWER EXTREMITY ANGIOGRAPHY N/A 05/05/2017   Procedure: LOWER EXTREMITY ANGIOGRAPHY;  Surgeon: [name], MD;  Location: [facility];  Service: Cardiovascular;  Laterality: N/A;   LUMBAR DISC SURGERY  82,90   ruptured disc   PERIPHERAL VASCULAR INTERVENTION Right 05/05/2017   Procedure: PERIPHERAL VASCULAR INTERVENTION;  Surgeon: [name], MD;  Location: [facility];  Service: Cardiovascular;  Laterality: Right;   PERIPHERAL VASCULAR INTERVENTION Right 10/19/2018   Procedure: PERIPHERAL VASCULAR INTERVENTION;  Surgeon: [name], MD;  Location: [facility];  Service: Cardiovascular;  Laterality: Right;   SCAR REVISION Right 2005   @ amputation   STUMP REVISION Right 08/21/2018   Procedure: REVISION RIGHT BELOW KNEE AMPUTATION, EXCISION FIBULA;  Surgeon: [name], MD;  Location: [facility];  Service: Orthopedics;  Laterality: Right;   STUMP REVISION Right 09/09/2018   Procedure: REVISION RIGHT BELOW KNEE AMPUTATION;  Surgeon: [name], MD;  Location: [facility];  Service: Orthopedics;  Laterality: Right;   TOE AMPUTATION Right 2005   Social History:  reports that he has been smoking  cigarettes. He started smoking about 57 years ago. He has a 47.00 pack-year smoking history. He has never used smokeless tobacco. He reports that he does not drink alcohol and does not use drugs.  Allergies  Allergen Reactions   Varenicline Nausea Only    Family History  Problem Relation Age of Onset   Cirrhosis Father     Prior to Admission medications   Medication Sig Start Date End Date Taking? Authorizing Provider  amoxicillin-clavulanate (AUGMENTIN) 875-125 MG tablet Take 1 tablet by mouth every 12 (twelve) hours for 7 days. 08/13/22 08/20/22  Curatolo, Adam, DO  aspirin 81 MG chewable tablet Chew 81 mg by mouth daily.    [provider]  atorvastatin (LIPITOR) 80 MG tablet Take 1 tablet (80 mg total) by mouth daily at 6 PM. 05/06/17   Arty Baumgartner, NP  calcium carbonate (OSCAL) 1500 (600 Ca) MG TABS tablet Take 600 mg of elemental calcium by mouth daily with breakfast.     [provider]  cholecalciferol (VITAMIN D) 1000 UNITS tablet Take 1,000 Units by mouth daily.    [provider]  docusate  sodium (COLACE) 100 MG capsule Take 100 mg by mouth 2 (two) times daily.    [provider]  furosemide (LASIX) 20 MG tablet Take 20 mg by mouth daily.    [provider]  hydrochlorothiazide (MICROZIDE) 12.5 MG capsule Take 12.5 mg by mouth daily.    [provider]  leflunomide (ARAVA) 20 MG tablet Take 20 mg by mouth daily.    [provider]  Liraglutide (VICTOZA) 18 MG/3ML SOLN injection Inject 1.8 mg into the skin every evening.     [provider]  metFORMIN (GLUCOPHAGE) 1000 MG tablet Take 1,000 mg by mouth 2 (two) times daily.    [provider]  metoprolol tartrate (LOPRESSOR) 25 MG tablet Take 0.5 tablets (12.5 mg total) by mouth 2 (two) times daily. 10/20/18 12/19/18  Arty Baumgartner, NP  morphine (MS CONTIN) 100 MG 12 hr tablet Take 100 mg by mouth every 12 (twelve) hours.    [provider]  oxymetazoline (AFRIN) 0.05 % nasal spray Place 1 spray into both nostrils 2 (two) times daily as needed for congestion.    [provider]  predniSONE (DELTASONE) 1 MG tablet Take 2 mg by mouth daily with breakfast.    [provider]  pregabalin (LYRICA) 75 MG capsule Take 75 mg by mouth See admin instructions. Take 75 mg by mouth in the morning and 150 mg in the afternoon    [provider]  vitamin B-12 (CYANOCOBALAMIN) 100 MCG tablet Take 100 mcg by mouth daily.    [provider]    Physical Exam: Vitals:   08/20/22 0859 08/20/22 1030 08/20/22 1215 08/20/22 1253  BP:   126/84   Pulse:  100 96   Resp:  (!) 23 13   Temp: (!) 97.1 F (36.2 C)   98.1 F (36.7 C)  TempSrc: Oral     SpO2:  93% 96%   Weight:      Height:        Constitutional:  Elderly male currently in NAD, calm, comfortable Eyes: PERRL, lids and conjunctivae normal ENMT: Mucous membranes are moist. Posterior pharynx clear of any exudate or lesions.  Neck: normal, supple, no masses, no thyromegaly Respiratory: clear to auscultation bilaterally, no wheezing, no crackles. Normal respiratory effort. No accessory muscle use.  Cardiovascular: Regular rate and rhythm, no murmurs / rubs / gallops.   Abdomen: Epigastric tenderness palpation.  Bowel sounds present all 4 quadrants. Musculoskeletal: no clubbing / cyanosis.  Right BKA Skin: Left lower extremity wounds present. Neurologic: CN 2-12 grossly intact. Strength 5/5 in all 4.  Psychiatric: Normal judgment and insight. Alert and oriented x 3. Normal mood.   Data Reviewed:  EKG revealed sinus tachycardia 106 bpm.  Reviewed labs, imaging, and pertinent records as noted above in HPI.  Assessment and Plan:   Colitis Intractable nausea Present prior to arrival.  Patient presents with complaints of severe epigastric abdominal pain with complaints of nausea and cold sweats.  Symptoms possibly been present for several years  without clear cause.  Records note prior gastric emptying study normal back in 2015.  He had been placed on Augmentin for treatment of colitis.  Urinalysis on 6/11 had noted concern for possible urinary tract infection which may be a factor in patient's symptoms.  Unclear if colitis had been inadequately treated, but appeared to be somewhat better on repeat meeting today.  Patient was noted to have significant atherosclerotic disease of the aorta, but vessels supplying the intestines were  thought to be patent for which abdominal angina was thought to be less likely.  Lastly patient has been on chronic pain medicines question possibility of opiate withdrawals. -Admit to a telemetry bed -Clear liquid diet and advance as tolerated to heart healthy carb modified -Check CRP -Rocephin and metronidazole IV  -Normal saline IV fluids at 75 mL/h -Antiemetics as needed   Urinary tract infection PTA.  Prior urinalysis that noted concern for infection on 6/11 noted large leukocytes with few bacteria, 11-20 RBCs/hpf, and greater than 50 WBCs. -Check urinalysis and urine culture  Essential hypertension Blood pressures 105/91 -160/76. -Continue metoprolol and hydrochlorothiazide  Diabetes mellitus type 2, with long-term use of insulin Glucose initially 201. -Hypoglycemic protocols -Check hemoglobin A1c in a.m. -Hold metformin and Victoza -Reduced Semglee to 60 units daily -CBGs before every meal with moderate SSI  Chronic pain -Continue morphine and Lyrica  Peripheral vascular disease S/p right BKA Patient followed by Dr. Allyson Sabal of cardiology in the outpatient setting.  Prior history of DES to right superficial femoral artery 2019.  Left -Continue aspirin and statin -Continue outpatient follow-up with Dr. Allyson Sabal  Rheumatoid arthritis Patient on chronic steroids and Enbrel in the outpatient setting. -Continue prednisone  Dyslipidemia -Continue atorvastatin  Tobacco abuse Patient still  smokes. -Continue nicotine patch  DVT prophylaxis: Lovenox Advance Care Planning:   Code Status: Full Code   Consults: vascular  Family Communication:  sister updated over the phone.  Severity of Illness: The appropriate patient status for this patient is OBSERVATION. Observation status is judged to be reasonable and necessary in order to provide the required intensity of service to ensure the patient's safety. The patient's presenting symptoms, physical exam findings, and initial radiographic and laboratory data in the context of their medical condition is felt to place them at decreased risk for further clinical deterioration. Furthermore, it is anticipated that the patient will be medically stable for discharge from the hospital within 2 midnights of admission.   Author: [name], MD 08/20/2022 1:53 PM  For on call review [email or web address].

## 2022-08-20 NOTE — ED Provider Notes (Signed)
[place] EMERGENCY DEPARTMENT AT [facility] Provider Note  CSN: [id number] Arrival date & time: 08/20/22 0160  Chief Complaint(s) Abdominal Pain  HPI Brian Zavala is a 72 y.o. male with past medical history as below, significant for HLD, CAD, BKA RLE DM RA who presents to the ED with complaint of abdominal pain.  He was diagnosed with colitis on 611 that time presented with abdominal pain lower, vomiting and nausea.  CT scan at that time was consistent with colitis and esophagitis.  Started on oral antibiotics and discharged with GI follow-up.  Patient returns today because his abdominal pain and nausea and vomiting worse.  Last dose of Augmentin was yesterday.  Pain is progressed since the onset.  He is having ongoing diarrhea.  Reduced urination.  No fevers.  Nausea unrelieved with phenergan/zofran per sister at bedside given PTA.  Past Medical History Past Medical History:  Diagnosis Date   Arthritis    "pretty much all over"   Cellulitis of left foot    COPD (chronic obstructive pulmonary disease) (HCC)    Dyspnea    Hyperlipidemia    Hypertension    Neuromuscular disorder (HCC)    neuropathy legs   Neuropathy    legs   Osteomyelitis (HCC) 04/2018   4th toe left foot   Osteomyelitis of fourth toe of left foot (HCC) 04/19/2018   Peripheral vascular disease (HCC)    Type II diabetes mellitus (HCC)    Wears glasses    Wears partial dentures    top and bottom partials   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) 11/27/2018   Hyperlipidemia 11/25/2018   Carotid artery disease (HCC) 11/25/2018   Critical limb ischemia with history of revascularization of same extremity (HCC) 10/19/2018   Below-knee amputation of right lower extremity (HCC) 09/09/2018   Dehiscence of amputation stump (HCC) 08/18/2018   H/O amputation of lesser toe, left (HCC) 05/06/2018   Peripheral vascular disease (HCC) 04/19/2018    Diabetic foot infection (HCC) 04/18/2018   Tobacco abuse counseling    Critical lower limb ischemia (HCC) 05/02/2017   Subacute osteomyelitis of right fibula (HCC)    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 Authorizing Provider  amoxicillin-clavulanate (AUGMENTIN) 875-125 MG tablet Take 1 tablet by mouth every 12 (twelve) hours for 7 days. 08/13/22 08/20/22  Curatolo, Adam, DO  aspirin 81 MG chewable tablet Chew 81 mg by mouth daily.    [provider]  atorvastatin (LIPITOR) 80 MG tablet Take 1 tablet (80 mg total) by mouth daily at 6 PM. 05/06/17   Arty Baumgartner, NP  calcium carbonate (OSCAL) 1500 (600 Ca) MG TABS tablet Take 600 mg of elemental calcium by mouth daily with breakfast.     [provider]  cholecalciferol (VITAMIN D) 1000 UNITS tablet Take 1,000 Units by mouth daily.    [provider]  docusate sodium (COLACE) 100 MG capsule Take 100 mg by mouth 2 (two)  times daily.    [provider]  furosemide (LASIX) 20 MG tablet Take 20 mg by mouth daily.    [provider]  hydrochlorothiazide (MICROZIDE) 12.5 MG capsule Take 12.5 mg by mouth daily.    [provider]  leflunomide (ARAVA) 20 MG tablet Take 20 mg by mouth daily.    [provider]  Liraglutide (VICTOZA) 18 MG/3ML SOLN injection Inject 1.8 mg into the skin every evening.     [provider]  metFORMIN (GLUCOPHAGE) 1000 MG tablet Take 1,000 mg by mouth 2  (two) times daily.    [provider]  metoprolol tartrate (LOPRESSOR) 25 MG tablet Take 0.5 tablets (12.5 mg total) by mouth 2 (two) times daily. 10/20/18 12/19/18  Arty Baumgartner, NP  morphine (MS CONTIN) 100 MG 12 hr tablet Take 100 mg by mouth every 12 (twelve) hours.    [provider]  oxymetazoline (AFRIN) 0.05 % nasal spray Place 1 spray into both nostrils 2 (two) times daily as needed for congestion.    [provider]  predniSONE (DELTASONE) 1 MG tablet Take 2 mg by mouth daily with breakfast.    [provider]  pregabalin (LYRICA) 75 MG capsule Take 75 mg by mouth See admin instructions. Take 75 mg by mouth in the morning and 150 mg in the afternoon    [provider]  vitamin B-12 (CYANOCOBALAMIN) 100 MCG tablet Take 100 mcg by mouth daily.    [provider]                                                                                                                                    Past Surgical History Past Surgical History:  Procedure Laterality Date   ABDOMINAL AORTOGRAM W/LOWER EXTREMITY Bilateral 10/19/2018   Procedure: ABDOMINAL AORTOGRAM W/LOWER EXTREMITY;  Surgeon: [name], MD;  Location: [facility] INVASIVE CV LAB;  Service: Cardiovascular;  Laterality: Bilateral;   ABOVE KNEE LEG AMPUTATION Right 11/27/2018   AMPUTATION Left 04/22/2018   Procedure: LEFT FOOT 4TH RAY AMPUTATION;  Surgeon: [name], MD;  Location: [facility] OR;  Service: Orthopedics;  Laterality: Left;   AMPUTATION Right 11/27/2018   Procedure: RIGHT ABOVE KNEE AMPUTATION;  Surgeon: [name], MD;  Location: [facility] OR;  Service: Orthopedics;  Laterality: Right;   BACK SURGERY     CHOLECYSTECTOMY N/A 10/26/2013   Procedure: LAPAROSCOPIC CHOLECYSTECTOMY WITH INTRAOPERATIVE CHOLANGIOGRAM;  Surgeon: Clovis Pu. Cornett, MD;  Location: [facility] OR;  Service: General;  Laterality: N/A;   COLONOSCOPY     CYST EXCISION Bilateral 02/15/2015   Procedure: LEFT  INDEX FINGER AND RIGHT MIDDLE FINGER NODULE EXCISION;  Surgeon: Tarry Kos, MD;  Location: [facility];  Service: Orthopedics;  Laterality: Bilateral;   CYST EXCISION PERINEAL N/A 12/08/2012   Procedure: CYST EXCISION PERINeum;  Surgeon: Maisie Fus A. Cornett, MD;  Location: [facility];  Service: General;  Laterality: N/A;   FOOT AMPUTATION Right 2005   I & D EXTREMITY Right 03/07/2017   Procedure: EXCISION FIBULAR HEAD RIGHT BELOW KNEE AMPUTATION;  Surgeon: [name], MD;  Location: [facility] OR;  Service: Orthopedics;  Laterality: Right;   INCISE AND DRAIN ABCESS  12/02/2014   PERINEAL ABSCESS   INCISION AND DRAINAGE PERIRECTAL ABSCESS Left 12/02/2014   Procedure: IRRIGATION AND DEBRIDEMENT PERINEAL ABSCESS;  Surgeon: Abigail Miyamoto, MD;  Location: [facility] OR;  Service: General;  Laterality: Left;   LEG AMPUTATION BELOW KNEE Right 2005   LOWER EXTREMITY ANGIOGRAPHY N/A 05/05/2017   Procedure: LOWER EXTREMITY ANGIOGRAPHY;  Surgeon: [name], MD;  Location: [facility] INVASIVE CV LAB;  Service: Cardiovascular;  Laterality: N/A;   LUMBAR DISC SURGERY  82,90   ruptured disc   PERIPHERAL VASCULAR INTERVENTION Right 05/05/2017   Procedure: PERIPHERAL VASCULAR INTERVENTION;  Surgeon: [name], MD;  Location: [facility] INVASIVE CV LAB;  Service: Cardiovascular;  Laterality: Right;   PERIPHERAL VASCULAR INTERVENTION Right 10/19/2018   Procedure: PERIPHERAL VASCULAR INTERVENTION;  Surgeon: [name], MD;  Location: [facility] INVASIVE CV LAB;  Service: Cardiovascular;  Laterality: Right;   SCAR REVISION Right 2005   @ amputation   STUMP REVISION Right 08/21/2018   Procedure: REVISION RIGHT BELOW KNEE AMPUTATION, EXCISION FIBULA;  Surgeon: [name], MD;  Location: [facility] OR;  Service: Orthopedics;  Laterality: Right;   STUMP REVISION Right 09/09/2018   Procedure: REVISION RIGHT BELOW KNEE AMPUTATION;  Surgeon: [name], MD;  Location: [facility] OR;  Service: Orthopedics;  Laterality:  Right;   TOE AMPUTATION Right 2005   Family History Family History  Problem Relation Age of Onset   Cirrhosis Father     Social History Social History   Tobacco Use   Smoking status: Every Day    Packs/day: 1.00    Years: 47.00    Additional pack years: 0.00    Total pack years: 47.00    Types: Cigarettes    Start date: 03/04/1965   Smokeless tobacco: Never  Vaping Use   Vaping Use: Never used  Substance Use Topics   Alcohol use: No   Drug use: No   Allergies Varenicline  Review of Systems Review of Systems  Constitutional:  Positive for appetite change. Negative for chills and fever.  HENT:  Negative for facial swelling and trouble swallowing.   Eyes:  Negative for photophobia and visual disturbance.  Respiratory:  Negative for cough and shortness of breath.   Cardiovascular:  Negative for chest pain and palpitations.  Gastrointestinal:  Positive for abdominal pain, diarrhea and nausea. Negative for vomiting.  Endocrine: Negative for polydipsia and polyuria.  Genitourinary:  Negative for difficulty urinating and hematuria.  Musculoskeletal:  Negative for gait problem and joint swelling.  Skin:  Negative for pallor and rash.  Neurological:  Negative for syncope and headaches.  Psychiatric/Behavioral:  Negative for agitation and confusion.     Physical Exam Vital Signs  I have reviewed the triage vital signs BP 126/84 (BP Location: Right Arm)   Pulse 96   Temp 98.1 F (36.7 C)   Resp 13   Ht 6\' 1"  (1.854 m)   Wt 74 kg   SpO2 96%   BMI 21.52 kg/m  Physical Exam Vitals and nursing note reviewed.  Constitutional:      General: He is not in acute distress.    Appearance: He is well-developed.  HENT:     Head: Normocephalic and atraumatic.  Right Ear: External ear normal.     Left Ear: External ear normal.     Mouth/Throat:     Mouth: Mucous membranes are dry.  Eyes:     General: No scleral icterus. Cardiovascular:     Rate and Rhythm: Normal rate  and regular rhythm.     Pulses: Normal pulses.     Heart sounds: Normal heart sounds.  Pulmonary:     Effort: Pulmonary effort is normal. No respiratory distress.     Breath sounds: Normal breath sounds.  Abdominal:     General: Abdomen is flat.     Palpations: Abdomen is soft.     Tenderness: There is abdominal tenderness in the right lower quadrant, suprapubic area and left lower quadrant.       Comments: Not peritoneal  Musculoskeletal:        General: Normal range of motion.     Right lower leg: No edema.     Left lower leg: No edema.  Skin:    General: Skin is warm and dry.     Capillary Refill: Capillary refill takes less than 2 seconds.  Neurological:     Mental Status: He is alert and oriented to person, place, and time.  Psychiatric:        Mood and Affect: Mood normal.        Behavior: Behavior normal.     ED Results and Treatments Labs (all labs ordered are listed, but only abnormal results are displayed) Labs Reviewed  COMPREHENSIVE METABOLIC PANEL - Abnormal; Notable for the following components:      Result Value   Sodium 134 (*)    Glucose, Bld 201 (*)    Albumin 3.4 (*)    All other components within normal limits  LIPASE, BLOOD - Abnormal; Notable for the following components:   Lipase 53 (*)    All other components within normal limits  CBC WITH DIFFERENTIAL/PLATELET  URINALYSIS, ROUTINE W REFLEX MICROSCOPIC                                                                                                                          Radiology CT Angio Abd/Pel W and/or Wo Contrast  Result Date: 08/20/2022 CLINICAL DATA:  Recent colitis worsening over the last 3 days. EXAM: CTA ABDOMEN AND PELVIS WITHOUT AND WITH CONTRAST TECHNIQUE: Multidetector CT imaging of the abdomen and pelvis was performed using the standard protocol during bolus administration of intravenous contrast. Multiplanar reconstructed images and MIPs were obtained and reviewed to evaluate the  vascular anatomy. RADIATION DOSE REDUCTION: This exam was performed according to the departmental dose-optimization program which includes automated exposure control, adjustment of the mA and/or kV according to patient size and/or use of iterative reconstruction technique. CONTRAST:  OMNIPAQUE IOHEXOL 350 MG/ML SOLN COMPARISON:  Standard contrast CT 08/13/2022. FINDINGS: VASCULAR Aorta: Moderate calcified and noncalcified plaque throughout the abdominal aorta without dissection or aneurysm formation. Celiac: Standard branching pattern. Mild calcified plaque at the  origin of the celiac. SMA: Mild calcified plaque at the origin of the SMA. Renals: Single bilateral main renal arteries. There is some calcified plaque at the origin of both vessels. Up to 50% stenosis on the left. Minimal on the right. IMA: Patent without evidence of aneurysm, dissection, vasculitis or significant stenosis. Inflow: Advanced partially calcified plaque along the iliac vessels with the areas of significant stenosis along the common iliac arteries. Patent lumen narrows to as small as 2.5 mm on the left and similar on the right. Please correlate for any evidence of symptoms including claudication. There is significant, severe calcified plaque along the internal iliac vessels and moderate severe along the external iliac is diffusely also with moderate areas of stenosis. Proximal Outflow: Calcified plaque also seen along the common femoral arteries with areas of significant stenosis greatest involving the proximal left common femoral artery with a severe stenosis. The right SFA is occluded proximally. Veins: No obvious venous abnormality within the limitations of this arterial phase study. Review of the MIP images confirms the above findings. NON-VASCULAR Lower chest: Once again changes of chronic interstitial lung disease with peripheral areas of fibrosis with some bronchiectasis and honeycombing. No pleural effusion. Hepatobiliary: No  focal liver abnormality is seen. Status post cholecystectomy. No biliary dilatation. Pancreas: Unremarkable. No pancreatic ductal dilatation or surrounding inflammatory changes. Spleen: Normal in size without focal abnormality. Adrenals/Urinary Tract: Adrenal glands are preserved. No enhancing renal mass or collecting system filling defect. Once again there is some small Bosniak 1 and 2 renal lesions, no specific imaging follow-up. Stomach/Bowel: No oral contrast. Large second portion duodenal diverticulum. The stomach is nondilated. Large bowel is of normal course and caliber with moderate colonic stool. Normal appendix. Distal ileal small bowel stool appearance. On the prior examination there was a subtle area of wall thickening along the distal descending colon. This area is less apparent today. This loop of bowel is nondilated. Lymphatic: Calcified lymph nodes in the upper abdomen. No other areas of specific lymph node enlargement. Reproductive: Prostate is unremarkable. Other: No free air or free fluid. Stable areas of wall thickening along the subcutaneous fat along the left side anteriorly. Musculoskeletal: Advanced degenerative changes of the spine and pelvis. Trace anterolisthesis of L3 on L4. Laminectomy changes at L5. Partial sacralization of the left side of L5. IMPRESSION: VASCULAR Extensive calcified plaque along the aorta and iliac vessels with areas of significant high-grade stenosis along the iliac vessels. There is occlusion of the right SFA and high-grade stenosis on the left. Please correlate with particular symptoms. Minimal calcified plaque along the celiac, SMA and IMA. NON-VASCULAR Previous areas subtle wall thickening along the distal descending colon is less apparent today. No obstruction, free air or free fluid. Scattered colonic stool. Changes again of chronic interstitial lung disease at the lung bases. Stable mild wall thickening of the distal esophagus. Electronically Signed   By:  Karen Kays M.D.   On: 08/20/2022 11:55    Pertinent labs & imaging results that were available during my care of the patient were reviewed by me and considered in my medical decision making (see MDM for details).  Medications Ordered in ED Medications  sodium chloride 0.9 % bolus 1,000 mL (1,000 mLs Intravenous New Bag/Given 08/20/22 0826)  ondansetron (ZOFRAN) injection 4 mg (4 mg Intravenous Given 08/20/22 0826)  morphine (PF) 4 MG/ML injection 4 mg (4 mg Intravenous Given 08/20/22 0826)  iohexol (OMNIPAQUE) 350 MG/ML injection 100 mL (100 mLs Intravenous Contrast Given 08/20/22 0916)  morphine (  PF) 4 MG/ML injection 4 mg (4 mg Intravenous Given 08/20/22 1319)                                                                                                                                     Procedures Procedures  (including critical care time)  Medical Decision Making / ED Course    Medical Decision Making:    Brian Zavala is a 72 y.o. male with past medical history as below, significant for HLD, CAD, BKA RLE DM RA who presents to the ED with complaint of abdominal pain.  . The complaint involves an extensive differential diagnosis and also carries with it a high risk of complications and morbidity.  Serious etiology was considered. Ddx includes but is not limited to: Differential diagnosis includes but is not exclusive to acute appendicitis, renal colic, testicular torsion, urinary tract infection, prostatitis,  diverticulitis, small bowel obstruction, colitis, abdominal aortic aneurysm, gastroenteritis, constipation etc.   Complete initial physical exam performed, notably the patient  was HDS, abdomen is tender lower quadrants but not peritoneal, he appears clinically dry.    Reviewed and confirmed nursing documentation for past medical history, family history, social history.  Vital signs reviewed.    Clinical Course as of 08/20/22 1335  Tue Aug 20, 2022  0945 Pain mildly  improved following intervention. Labs stable. CT imaging pending [SG]  1028 Spoke with primary care provider on telephone, given update.  She was concern for possible mesenteric ischemia, he has appointment with vascular surgery this morning at 10:15 AM. [SG]  1220 CT angio "IMPRESSION: VASCULAR  Extensive calcified plaque along the aorta and iliac vessels with areas of significant high-grade stenosis along the iliac vessels. There is occlusion of the right SFA and high-grade stenosis on the left. Please correlate with particular symptoms.  Minimal calcified plaque along the celiac, SMA and IMA.  NON-VASCULAR  Previous areas subtle wall thickening along the distal descending colon is less apparent today. No obstruction, free air or free fluid. Scattered colonic stool.  Changes again of chronic interstitial lung disease at the lung bases.  Stable mild wall thickening of the distal esophagus." [SG]  1234 Spoke w/ Dr Edilia Bo vascular, recommend o/p f/u He follows w/ Dr Allyson Sabal in the office, known SFA occlusion  [SG]  1307 Pt with failure of op abx treatment of colitis, worsening nausea/abdominal pain. Recommend admission at this time for further tx of his colitis in setting of failed o/p tx, intractable abd pain [SG]    Clinical Course User Index [SG] Sloan Leiter, DO    Patient diagnosed with colitis around a week ago, has had progressively worsening abdominal pain, nausea and vomiting, poor p.o. intake. Difficulty tolerate p.o. intake, ongoing nausea despite Zofran and Phenergan taken at home. Patient continues to have ongoing abdominal pain requiring parenteral analgesics and parenteral antiemetics. He was unable to tolerate Augmentin as prescribed to him at prior  visit due to worsening nausea and vomiting. At this point recommend admission for intractable abdominal pain intractable nausea and vomiting. Patient/family agreeable. Admitted TR[facility] Dr Katrinka Blazing        Additional  history obtained: -Additional history obtained from family -External records from outside source obtained and reviewed including: Chart review including previous notes, labs, imaging, consultation notes including recent ED visit, medications, prior labs and imaging   Lab Tests: -I ordered, reviewed, and interpreted labs.   The pertinent results include:   Labs Reviewed  COMPREHENSIVE METABOLIC PANEL - Abnormal; Notable for the following components:      Result Value   Sodium 134 (*)    Glucose, Bld 201 (*)    Albumin 3.4 (*)    All other components within normal limits  LIPASE, BLOOD - Abnormal; Notable for the following components:   Lipase 53 (*)    All other components within normal limits  CBC WITH DIFFERENTIAL/PLATELET  URINALYSIS, ROUTINE W REFLEX MICROSCOPIC    Notable for lipase mildly elev, sodium slight low  EKG   EKG Interpretation  Date/Time:  Tuesday August 20 2022 07:49:21 EDT Ventricular Rate:  106 PR Interval:  145 QRS Duration: 109 QT Interval:  349 QTC Calculation: 464 R Axis:   -46 Text Interpretation: Sinus tachycardia Atrial premature complex Left anterior fascicular block Abnormal R-wave progression, early transition LVH with secondary repolarization abnormality similar to prior Confirmed by Tanda Rockers ([id number]) on 08/20/2022 8:08:01 AM         Imaging Studies ordered: I ordered imaging studies including CT angio a/p I independently visualized the following imaging with scope of interpretation limited to determining acute life threatening conditions related to emergency care; findings noted above, significant for vascular abnormalities, ?colitis  I independently visualized and interpreted imaging. I agree with the radiologist interpretation   Medicines ordered and prescription drug management: Meds ordered this encounter  Medications   sodium chloride 0.9 % bolus 1,000 mL   ondansetron (ZOFRAN) injection 4 mg   morphine (PF) 4 MG/ML injection 4 mg    iohexol (OMNIPAQUE) 350 MG/ML injection 100 mL   morphine (PF) 4 MG/ML injection 4 mg    -I have reviewed the patients home medicines and have made adjustments as needed   Consultations Obtained: I requested consultation with the Dr Edilia Bo vascular,  and discussed lab and imaging findings as well as pertinent plan - they recommend: f/u office   Cardiac Monitoring: The patient was maintained on a cardiac monitor.  I personally viewed and interpreted the cardiac monitored which showed an underlying rhythm of: SR  Social Determinants of Health:  Diagnosis or treatment significantly limited by social determinants of health: current smoker   Reevaluation: After the interventions noted above, I reevaluated the patient and found that they have improved  Co morbidities that complicate the patient evaluation  Past Medical History:  Diagnosis Date   Arthritis    "pretty much all over"   Cellulitis of left foot    COPD (chronic obstructive pulmonary disease) (HCC)    Dyspnea    Hyperlipidemia    Hypertension    Neuromuscular disorder (HCC)    neuropathy legs   Neuropathy    legs   Osteomyelitis (HCC) 04/2018   4th toe left foot   Osteomyelitis of fourth toe of left foot (HCC) 04/19/2018   Peripheral vascular disease (HCC)    Type II diabetes mellitus (HCC)    Wears glasses    Wears partial dentures    top and  bottom partials      Dispostion: Disposition decision including need for hospitalization was considered, and patient admitted to the hospital.    Final Clinical Impression(s) / ED Diagnoses Final diagnoses:  Abdominal pain, unspecified abdominal location  Colitis  Intractable abdominal pain  Intractable nausea and vomiting     This chart was dictated using voice recognition software.  Despite best efforts to proofread,  errors can occur which can change the documentation meaning.    Tanda Rockers A, DO 08/20/22 1335

## 2022-08-20 NOTE — ED Triage Notes (Signed)
Pt to the ed from home with a CC of abd pain x 3 days. Pt was dxed with colitis on Sunday in the ed and given abx. Pt relays he stopped taking abx because it hurt his stomach even more. Pt relays nausea has increased. Pt denies fever, chills, cp, sob, dizziness.

## 2022-08-21 ENCOUNTER — Telehealth: Payer: Self-pay | Admitting: Nurse Practitioner

## 2022-08-21 DIAGNOSIS — R110 Nausea: Secondary | ICD-10-CM | POA: Diagnosis not present

## 2022-08-21 LAB — COMPREHENSIVE METABOLIC PANEL
ALT: 28 U/L (ref 0–44)
AST: 29 U/L (ref 15–41)
Albumin: 2.9 g/dL — ABNORMAL LOW (ref 3.5–5.0)
Alkaline Phosphatase: 47 U/L (ref 38–126)
Anion gap: 9 (ref 5–15)
BUN: 9 mg/dL (ref 8–23)
CO2: 28 mmol/L (ref 22–32)
Calcium: 8.2 mg/dL — ABNORMAL LOW (ref 8.9–10.3)
Chloride: 99 mmol/L (ref 98–111)
Creatinine, Ser: 0.79 mg/dL (ref 0.61–1.24)
GFR, Estimated: >60 mL/min (ref >60)
Glucose, Bld: 159 mg/dL — ABNORMAL HIGH (ref 70–99)
Potassium: 4 mmol/L (ref 3.5–5.1)
Sodium: 136 mmol/L (ref 135–145)
Total Bilirubin: 0.5 mg/dL (ref 0.3–1.2)
Total Protein: 6.2 g/dL — ABNORMAL LOW (ref 6.5–8.1)

## 2022-08-21 LAB — GLUCOSE, CAPILLARY
Glucose-Capillary: 154 mg/dL — ABNORMAL HIGH (ref 70–99)
Glucose-Capillary: 200 mg/dL — ABNORMAL HIGH (ref 70–99)
Glucose-Capillary: 311 mg/dL — ABNORMAL HIGH (ref 70–99)

## 2022-08-21 LAB — CBC
HCT: 40 % (ref 39.0–52.0)
Hemoglobin: 13 g/dL (ref 13.0–17.0)
MCH: 28.5 pg (ref 26.0–34.0)
MCHC: 32.5 g/dL (ref 30.0–36.0)
MCV: 87.7 fL (ref 80.0–100.0)
Platelets: 232 10*3/uL (ref 150–400)
RBC: 4.56 MIL/uL (ref 4.22–5.81)
RDW: 14.6 % (ref 11.5–15.5)
WBC: 5.7 10*3/uL (ref 4.0–10.5)
nRBC: 0 % (ref 0.0–0.2)

## 2022-08-21 LAB — C-REACTIVE PROTEIN: CRP: 0.6 mg/dL (ref <1.0)

## 2022-08-21 LAB — HEMOGLOBIN A1C
Hgb A1c MFr Bld: 7 % — ABNORMAL HIGH (ref 4.8–5.6)
Mean Plasma Glucose: 154.2 mg/dL

## 2022-08-21 MED ORDER — ONDANSETRON 4 MG PO TBDP: 4.0000 mg | ORAL_TABLET | Freq: Three times a day (TID) | ORAL | 0 refills | Status: DC | PRN

## 2022-08-21 MED ORDER — METRONIDAZOLE 500 MG PO TABS: 500.0000 mg | ORAL_TABLET | Freq: Two times a day (BID) | ORAL | 0 refills | Status: AC

## 2022-08-21 MED ORDER — PROMETHAZINE HCL 25 MG/ML IJ SOLN: 12.5000 mg | Freq: Once | INTRAMUSCULAR | 0 refills | Status: DC | PRN

## 2022-08-21 MED ORDER — CEFDINIR 300 MG PO CAPS: 300.0000 mg | ORAL_CAPSULE | Freq: Two times a day (BID) | ORAL | 0 refills | Status: DC

## 2022-08-21 NOTE — Discharge Summary (Signed)
Physician Discharge Summary   Patient: Brian Zavala MRN: [id number] DOB: 1950-05-06  Admit date:     08/20/2022  Discharge date: 08/21/22  Discharge Physician: Tyrone Nine   PCP: Pcp, No   Recommendations at discharge:  Follow up with PCP in 1-2 weeks.  Note plan to hold metformin and decrease IM phenergan dose to 12.5mg  (only to be administered if zofran ODT ineffective).  Re-establish care with [facility] for chronic GI symptoms.  Follow up routinely with cardiology, orthopedics  Discharge Diagnoses: Principal Problem:   Nausea Active Problems:   Colitis   UTI (urinary tract infection)   Essential hypertension   Type 2 diabetes mellitus with diabetic neuropathy, with long-term current use of insulin (HCC)   Chronic pain   S/P unilateral BKA (below knee amputation) (HCC)   Peripheral vascular disease (HCC)   Rheumatoid arthritis (HCC)   Tobacco abuse  HPI: Brian Zavala is a 72 y.o. male with medical history significant of hypertension, hyperlipidemia, diabetes mellitus type 2, PVD s/p right AKA, rheumatoid arthritis, who presents with complaints of abdominal pain.  History is obtained from the patient with assistance of his mother and sister.  Apparently patient has been having symptoms for approximately 5 years, but have been less frequent and less severe.  Patient describes having a twisting pain in the middle of the stomach with nausea causes him to break out into a cold sweat.  Over the last 2 weeks episodes have become more frequent and more severe.  Denies having any episodes of vomiting, but had had decreased p.o. intake.  Symptoms previously have been occurring usually in the afternoon and to his knowledge had no association with eating.  He did report having loose stools the other day. Patient had initially been seen on 6/11 for symptoms and diagnosed with colitis. Urinalysis at that time also showed possible concern for UTI.  He had been discharged home with a  prescription for Augmentin.  He tried taking the medication for a week but stopped it yesterday as seem to make his symptoms worse.  His sister notes that normally she could give him oral Phenergan with some improvement in nausea symptoms, but she had even been giving it to him IM without improvement.   In the emergency department patient was noted to be afebrile with mild tachycardia and tachypnea and all other vital signs maintained.  Labs sodium 134, glucose 201, and lipase 53.  CT angiogram of the abdomen and pelvis with contrast noted extensive calcified plaque along the aorta and iliac vessels with significant high-grade stenosis along the iliac vessels, occlusion of the right SFA and high-grade stenosis on the left, subtle wall thickening along the distal descending colon less apparent than prior study done from 6/11.  Hospital Course:  With changed antibiotics and a single dose of zofran, the patient's symptoms have abated entirely. He's tolerating a diet and would like to go home.  Assessment and Plan: Colitis Intractable nausea Present prior to arrival.  Patient presents with complaints of severe epigastric abdominal pain with complaints of nausea and cold sweats.  Symptoms possibly been present for several years without clear cause.  Records note prior gastric emptying study normal back in 2015.  He had been placed on Augmentin for treatment of colitis.  Urinalysis on 6/11 had noted concern for possible urinary tract infection which may be a factor in patient's symptoms.  Unclear if colitis had been inadequately treated, but appeared to be somewhat better on repeat meeting  today.  Patient was noted to have significant atherosclerotic disease of the aorta, but vessels supplying the intestines were thought to be patent for which abdominal angina was thought to be less likely.  Lastly patient has been on chronic pain medicines question possibility of opiate withdrawals. - CRP reassuring, WBC 5.7k,  afebrile.  - Continue alternative abx with cephalosporin and flagyl. Has not required any further antiemetic since 4mg  po dose in ED, will discharge with prescription for ODT.  - DC metformin for now given a very chronic component to GI complaints.  - Discussed the case with GI who will arrange outpatient follow up. Since the patient does not require IV therapies and feels well, he can be safely discharged home with return precautions.     Urinary tract infection PTA.  Prior urinalysis that noted concern for infection on 6/11 noted large leukocytes with few bacteria, 11-20 RBCs/hpf, and greater than 50 WBCs. - Abx as above   Essential hypertension Blood pressures 105/91 -160/76. -Continue metoprolol and hydrochlorothiazide   Diabetes mellitus type 2, with long-term use of insulin Hold metformin, otherwise continue home Tx.    Chronic pain -Continue morphine and Lyrica   Peripheral vascular disease S/p right BKA Patient followed by Dr. Allyson Sabal of cardiology in the outpatient setting.  Prior history of DES to right superficial femoral artery 2019.  Left -Continue aspirin and statin -Continue outpatient follow-up with Dr. Allyson Sabal   Rheumatoid arthritis Patient on chronic steroids and Enbrel in the outpatient setting. -Continue prednisone   Dyslipidemia -Continue atorvastatin   Tobacco abuse Patient still smokes. -Continue nicotine patch  Consultants: [facility] (no inpatient evaluation requested) Procedures performed: None  Disposition: Home Diet recommendation: Carb-modified, heart healthy DISCHARGE MEDICATION: Allergies as of 08/21/2022       Reactions   Varenicline Nausea Only        Medication List     STOP taking these medications    metFORMIN 1000 MG tablet Commonly known as: GLUCOPHAGE   metoprolol tartrate 25 MG tablet Commonly known as: LOPRESSOR       TAKE these medications    acetaminophen 500 MG tablet Commonly known as: TYLENOL Take 1,000 mg by  mouth every 6 (six) hours as needed.   aspirin 81 MG chewable tablet Chew 81 mg by mouth daily.   atorvastatin 80 MG tablet Commonly known as: LIPITOR Take 1 tablet (80 mg total) by mouth daily at 6 PM.   calcium carbonate 1500 (600 Ca) MG Tabs tablet Commonly known as: OSCAL Take 600 mg of elemental calcium by mouth daily with breakfast.   cefdinir 300 MG capsule Commonly known as: OMNICEF Take 1 capsule (300 mg total) by mouth 2 (two) times daily.   cholecalciferol 1000 units tablet Commonly known as: VITAMIN D Take 1,000 Units by mouth daily.   Enbrel 50 MG/ML injection Generic drug: etanercept Inject 50 mg into the skin once a week.   Eucerin Advanced Repair Crea Apply 1 application  topically 2 (two) times daily.   fexofenadine 180 MG tablet Commonly known as: ALLEGRA Take 180 mg by mouth daily as needed for allergies.   furosemide 20 MG tablet Commonly known as: LASIX Take 20 mg by mouth daily as needed for fluid or edema.   hydrochlorothiazide 12.5 MG capsule Commonly known as: MICROZIDE Take 12.5 mg by mouth daily.   leflunomide 20 MG tablet Commonly known as: ARAVA Take 20 mg by mouth daily.   Liraglutide 18 MG/3ML Soln injection Commonly known as: VICTOZA Inject  1.8 mg into the skin every evening.   metoprolol succinate 25 MG 24 hr tablet Commonly known as: TOPROL-XL Take 25 mg by mouth every evening.   metroNIDAZOLE 500 MG tablet Commonly known as: FLAGYL Take 1 tablet (500 mg total) by mouth 2 (two) times daily for 7 days.   morphine 100 MG 12 hr tablet Commonly known as: MS CONTIN Take 100 mg by mouth every 12 (twelve) hours.   nicotine 21 mg/24hr patch Commonly known as: NICODERM CQ - dosed in mg/24 hours Place 21 mg onto the skin daily.   ondansetron 4 MG disintegrating tablet Commonly known as: ZOFRAN-ODT Take 1 tablet (4 mg total) by mouth every 8 (eight) hours as needed for nausea or vomiting.   predniSONE 1 MG tablet Commonly  known as: DELTASONE Take 2 mg by mouth daily with breakfast.   pregabalin 150 MG capsule Commonly known as: LYRICA Take 150 mg by mouth 2 (two) times daily.   PREVIDENT 5000 BOOSTER PLUS DT Place 1 application  onto teeth every evening.   promethazine 25 MG tablet Commonly known as: PHENERGAN Take 25 mg by mouth every 8 (eight) hours as needed for nausea or vomiting. What changed: Another medication with the same name was changed. Make sure you understand how and when to take each.   promethazine 25 MG/ML injection Commonly known as: PHENERGAN Inject 0.5 mLs (12.5 mg total) into the vein once as needed for nausea or vomiting. If not responding to oral promethazine. What changed: how much to take   Semglee 100 UNIT/ML injection Generic drug: insulin glargine Inject 75 Units into the skin daily.   sodium chloride 0.65 % Soln nasal spray Commonly known as: OCEAN Place 1 spray into both nostrils 4 (four) times daily as needed for congestion.   vitamin B-12 100 MCG tablet Commonly known as: CYANOCOBALAMIN Take 100 mcg by mouth daily.        Follow-up Information     [facility]. Call.   Specialty: Gastroenterology Why: Call the office if they do not contact you in the next 24-48 hours to arrange an appointment. Alcide Evener, NP has initiated the referral. Contact information: [address] [place] [address] [phone]               Discharge Exam: Ceasar Mons Weights   08/20/22 0729  Weight: 74 kg  BP 131/65 (BP Location: Left Arm)   Pulse 88   Temp 97.6 F (36.4 C) (Oral)   Resp 18   Ht 6\' 1"  (1.854 m)   Wt 74 kg   SpO2 96%   BMI 21.52 kg/m   No distress, appears older than stated age. Reporting he feels completely fine. Sister who is RN at bedside a bit exasperated as his symptoms were much worse PTA.  Clear, diminished Soft, NT, ND, +BS Healed scars from laparoscopic abdominal surgery and lumbar  spinal instrumentation. RRR, no MRG or edema. RLE amputation with in exudate at stump site.   Condition at discharge: stable  The results of significant diagnostics from this hospitalization (including imaging, microbiology, ancillary and laboratory) are listed below for reference.   Imaging Studies: CT Angio Abd/Pel W and/or Wo Contrast  Result Date: 08/20/2022 CLINICAL DATA:  Recent colitis worsening over the last 3 days. EXAM: CTA ABDOMEN AND PELVIS WITHOUT AND WITH CONTRAST TECHNIQUE: Multidetector CT imaging of the abdomen and pelvis was performed using the standard protocol during bolus administration of intravenous contrast. Multiplanar reconstructed images and MIPs  were obtained and reviewed to evaluate the vascular anatomy. RADIATION DOSE REDUCTION: This exam was performed according to the departmental dose-optimization program which includes automated exposure control, adjustment of the mA and/or kV according to patient size and/or use of iterative reconstruction technique. CONTRAST:  OMNIPAQUE IOHEXOL 350 MG/ML SOLN COMPARISON:  Standard contrast CT 08/13/2022. FINDINGS: VASCULAR Aorta: Moderate calcified and noncalcified plaque throughout the abdominal aorta without dissection or aneurysm formation. Celiac: Standard branching pattern. Mild calcified plaque at the origin of the celiac. SMA: Mild calcified plaque at the origin of the SMA. Renals: Single bilateral main renal arteries. There is some calcified plaque at the origin of both vessels. Up to 50% stenosis on the left. Minimal on the right. IMA: Patent without evidence of aneurysm, dissection, vasculitis or significant stenosis. Inflow: Advanced partially calcified plaque along the iliac vessels with the areas of significant stenosis along the common iliac arteries. Patent lumen narrows to as small as 2.5 mm on the left and similar on the right. Please correlate for any evidence of symptoms including claudication. There is  significant, severe calcified plaque along the internal iliac vessels and moderate severe along the external iliac is diffusely also with moderate areas of stenosis. Proximal Outflow: Calcified plaque also seen along the common femoral arteries with areas of significant stenosis greatest involving the proximal left common femoral artery with a severe stenosis. The right SFA is occluded proximally. Veins: No obvious venous abnormality within the limitations of this arterial phase study. Review of the MIP images confirms the above findings. NON-VASCULAR Lower chest: Once again changes of chronic interstitial lung disease with peripheral areas of fibrosis with some bronchiectasis and honeycombing. No pleural effusion. Hepatobiliary: No focal liver abnormality is seen. Status post cholecystectomy. No biliary dilatation. Pancreas: Unremarkable. No pancreatic ductal dilatation or surrounding inflammatory changes. Spleen: Normal in size without focal abnormality. Adrenals/Urinary Tract: Adrenal glands are preserved. No enhancing renal mass or collecting system filling defect. Once again there is some small Bosniak 1 and 2 renal lesions, no specific imaging follow-up. Stomach/Bowel: No oral contrast. Large second portion duodenal diverticulum. The stomach is nondilated. Large bowel is of normal course and caliber with moderate colonic stool. Normal appendix. Distal ileal small bowel stool appearance. On the prior examination there was a subtle area of wall thickening along the distal descending colon. This area is less apparent today. This loop of bowel is nondilated. Lymphatic: Calcified lymph nodes in the upper abdomen. No other areas of specific lymph node enlargement. Reproductive: Prostate is unremarkable. Other: No free air or free fluid. Stable areas of wall thickening along the subcutaneous fat along the left side anteriorly. Musculoskeletal: Advanced degenerative changes of the spine and pelvis. Trace  anterolisthesis of L3 on L4. Laminectomy changes at L5. Partial sacralization of the left side of L5. IMPRESSION: VASCULAR Extensive calcified plaque along the aorta and iliac vessels with areas of significant high-grade stenosis along the iliac vessels. There is occlusion of the right SFA and high-grade stenosis on the left. Please correlate with particular symptoms. Minimal calcified plaque along the celiac, SMA and IMA. NON-VASCULAR Previous areas subtle wall thickening along the distal descending colon is less apparent today. No obstruction, free air or free fluid. Scattered colonic stool. Changes again of chronic interstitial lung disease at the lung bases. Stable mild wall thickening of the distal esophagus. Electronically Signed   By: Karen Kays M.D.   On: 08/20/2022 11:55   CT ABDOMEN PELVIS W CONTRAST  Result Date: 08/13/2022 CLINICAL DATA:  Acute abdominal pain. EXAM: CT ABDOMEN AND PELVIS WITH CONTRAST TECHNIQUE: Multidetector CT imaging of the abdomen and pelvis was performed using the standard protocol following bolus administration of intravenous contrast. RADIATION DOSE REDUCTION: This exam was performed according to the departmental dose-optimization program which includes automated exposure control, adjustment of the mA and/or kV according to patient size and/or use of iterative reconstruction technique. CONTRAST:  OMNIPAQUE IOHEXOL 300 MG/ML  SOLN COMPARISON:  Pelvic CT 161096, chest CT 03/15/2021 reviewed FINDINGS: Lower chest: Chronic interstitial lung disease at the bases. Normal heart size with coronary artery calcifications. Mild wall thickening of the distal esophagus. Hepatobiliary: Diffuse hepatic steatosis. No focal liver abnormality. Clips in the gallbladder fossa postcholecystectomy. No biliary dilatation. Pancreas: No ductal dilatation or inflammation. Spleen: Normal in size without focal abnormality. Trace perisplenic free fluid. Adrenals/Urinary Tract: No adrenal nodule.  No hydronephrosis or perinephric edema. Homogeneous renal enhancement with symmetric excretion on delayed phase imaging. No renal calculi. Simple right renal cyst needs no further imaging follow-up. There is mild diffuse bladder wall thickening questionable enhancement. No definite perivesicular fat stranding. Stomach/Bowel: Mixed liquid and solid stool throughout the colon, equivocal areas of colonic enhancement in the descending colon, for example series 2, image 50. No colonic wall thickening. The appendix is normal. Occasional fluid-filled small bowel without inflammatory change mild wall thickening of the distal esophagus. There is a moderate to large periampullary duodenal diverticulum. Vascular/Lymphatic: Advanced aortic atherosclerosis. No aneurysm. Common and external iliac arteries are moderately calcified. There is superficial femoral artery appears to be occluded, likely chronic. Portal vein is patent. Multiple calcified lymph nodes in the porta hepatis and upper abdomen, likely sequela of prior granulomatous disease. No noncalcified adenopathy. Reproductive: Prostate is unremarkable. Other: Trace perisplenic free fluid. No free air. No abdominopelvic collection. Soft tissue densities in the anterior abdominal wall typical of medication injection sites. Musculoskeletal: There are no acute or suspicious osseous abnormalities. Mild T11 superior endplate compression fracture appears chronic. There is mild fatty atrophy of the right gluteal musculature. IMPRESSION: 1. Mixed liquid and solid stool throughout the colon, equivocal areas of colonic enhancement in the descending colon. This may represent mild colitis. 2. Mild diffuse bladder wall thickening, possible cystitis. Recommend correlation with urinalysis. 3. Mild wall thickening of the distal esophagus, can be seen with reflux or esophagitis. 4. Hepatic steatosis. 5. Chronic interstitial lung disease at the bases. 6. Advanced aortic and branch  atherosclerosis. Right superficial femoral artery appears to be occluded, likely a chronic finding. Aortic Atherosclerosis (ICD10-I70.0). Electronically Signed   By: Narda Rutherford M.D.   On: 08/13/2022 21:16    Microbiology: Results for orders placed or performed during the hospital encounter of 11/26/18  SARS CORONAVIRUS 2 (TAT 6-24 HRS) Nasopharyngeal Nasopharyngeal Swab     Status: None   Collection Time: 11/26/18 10:27 AM   Specimen: Nasopharyngeal Swab  Result Value Ref Range Status   SARS Coronavirus 2 NEGATIVE NEGATIVE Final    Comment: (NOTE) SARS-CoV-2 target nucleic acids are NOT DETECTED. The SARS-CoV-2 RNA is generally detectable in upper and lower respiratory specimens during the acute phase of infection. Negative results do not preclude SARS-CoV-2 infection, do not rule out co-infections with other pathogens, and should not be used as the sole basis for treatment or other patient management decisions. Negative results must be combined with clinical observations, patient history, and epidemiological information. The expected result is Negative. Fact Sheet for Patients: HairSlick.no Fact Sheet for Healthcare Providers: quierodirigir.com This test is not yet approved or  cleared by the [place] and  has been authorized for detection and/or diagnosis of SARS-CoV-2 by FDA under an Emergency Use Authorization (EUA). This EUA will remain  in effect (meaning this test can be used) for the duration of the COVID-19 declaration under Section 56 4(b)(1) of the Act, 21 U.S.C. section 360bbb-3(b)(1), unless the authorization is terminated or revoked sooner. Performed at [facility], [address], [place], [place] [address]     Labs: CBC: Recent Labs  Lab 08/20/22 0754 08/21/22 0308  WBC 8.2 5.7  NEUTROABS 6.1  --   HGB 14.8 13.0  HCT 46.7 40.0  MCV 91.4 87.7  PLT 258 232   Basic Metabolic  Panel: Recent Labs  Lab 08/20/22 0754 08/21/22 0308  NA 134* 136  K 4.0 4.0  CL 98 99  CO2 23 28  GLUCOSE 201* 159*  BUN 8 9  CREATININE 0.71 0.79  CALCIUM 9.1 8.2*   Liver Function Tests: Recent Labs  Lab 08/20/22 0754 08/21/22 0308  AST 37 29  ALT 31 28  ALKPHOS 58 47  BILITOT 0.8 0.5  PROT 6.7 6.2*  ALBUMIN 3.4* 2.9*   CBG: Recent Labs  Lab 08/20/22 1739 08/20/22 2014 08/21/22 0650 08/21/22 0722  GLUCAP 144* 252* 154* 200*    Discharge time spent: greater than 30 minutes.  Signed: Tyrone Nine, MD [facility] 08/21/2022

## 2022-08-21 NOTE — Telephone Encounter (Signed)
Patient called to schedule an appt to be seen by [facility] on 10/28/22 at 8:30am.

## 2022-08-21 NOTE — Plan of Care (Signed)

## 2022-08-21 NOTE — Telephone Encounter (Signed)
Brian Zavala, patient notes with colitis with nausea and vomiting, he will be discharged from the hospital today.  Prior patient of Dr. Marzetta Board.  Please contact the patient and schedule him for a new patient office appointment with any provider.  Thank you

## 2022-08-21 NOTE — Plan of Care (Signed)

## 2022-08-22 ENCOUNTER — Encounter: Payer: Self-pay | Admitting: Cardiovascular Disease

## 2022-08-22 ENCOUNTER — Ambulatory Visit: Payer: Medicare (Managed Care) | Attending: Cardiovascular Disease | Admitting: Cardiovascular Disease

## 2022-08-22 VITALS — BP 120/62 | HR 102 | Ht 73.0 in | Wt 163.0 lb

## 2022-08-22 DIAGNOSIS — Z716 Tobacco abuse counseling: Secondary | ICD-10-CM | POA: Diagnosis not present

## 2022-08-22 DIAGNOSIS — I739 Peripheral vascular disease, unspecified: Secondary | ICD-10-CM

## 2022-08-22 DIAGNOSIS — I70229 Atherosclerosis of native arteries of extremities with rest pain, unspecified extremity: Secondary | ICD-10-CM

## 2022-08-22 DIAGNOSIS — E782 Mixed hyperlipidemia: Secondary | ICD-10-CM | POA: Diagnosis not present

## 2022-08-22 DIAGNOSIS — I6523 Occlusion and stenosis of bilateral carotid arteries: Secondary | ICD-10-CM | POA: Diagnosis not present

## 2022-08-22 NOTE — Assessment & Plan Note (Signed)
History of critical limb ischemia status post right BKA by Dr. Due to in 2005.  I intervened on his right SFA because of a stump ulcer 05/05/2017 and again 10/19/2018.  He ultimately had a right AKA and his Doppler showed occlusion of his right SFA.  He is wheelchair-bound bound.  Recent Doppler studies reveal a left ABI of 0.98.

## 2022-08-22 NOTE — Assessment & Plan Note (Signed)
History of carotid artery disease with Dopplers performed 11/07/2021 showing moderate bilateral ICA stenosis.  He does have bilateral carotid bruits.  We will recheck carotid Dopplers this coming September.

## 2022-08-22 NOTE — Assessment & Plan Note (Signed)
Ongoing tobacco abuse of 1-1/2 packs a day recalcitrant to resector modification.

## 2022-08-22 NOTE — Progress Notes (Signed)
08/22/2022 Brian Zavala   [birth year]  [id number]  Primary Physician Pcp, No Primary Cardiologist: [name] MD Nicholes Calamity, [place]  HPI:  Brian Zavala is a 72 y.o.   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 He was referred by Dr. Lajoyce Corners for peripheral vascular evaluation because of critical limb ischemia.  I last saw him in the office 04/16/2022.  He has a history of 75-pack-years of tobacco abuse currently smoking 1-1/2 packs a day as well as 2 hypertension, diabetes and hyperlipidemia. He has never had a heart attack or stroke. 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 In June of last year he developed leg stump ulcer which has not healed. Dopplers performed in our office yesterday showed an occluded right SFA.. I performed angiography on him 05/05/17 revealing an occluded right SFA from the origin down the adductor canal which I was able to revascularize using directional atherectomy with drug-eluting balloon angioplasty. Did have an occluded popliteal as well. Follow-up lower extremity arterial Doppler studies performed 05/14/17 showed marked improvement. He did also stop smoking at that time.    He had begun hyperbaric therapy and his right stump ulcer has completely healed after 6 months.  He did start smoking again.  Unfortunately developed a recurrent ulcer several months ago and had repeat surgery by Dr. Lajoyce Corners 09/09/2018 with revision of his stump.  Unfortunately, the wound has been slow to heal.  Follow-up Dopplers performed today revealed occluded right SFA.   I performed peripheral angiography on him 10/19/2018 revealing occluded right SFA which I was able to revascularized using atherectomy and drug-coated balloon angioplasty.  Unfortunate there was slow flow although follow-up Doppler studies performed 11/04/2018 revealed his SFA to be widely patent.  He continues to have infection in his stump with severe  pain and I suspect will ultimately require right above-the-knee amputation.  He did stop smoking on August 1 however.   Since I saw him in the office 4 months ago he continues to do well.  He was hospitalized recently for GI related issue.  He remains wheelchair-bound.  He continues to smoke 1-1/2 packs a day.  He denies chest pain or shortness of breath.   Current Meds  Medication Sig   acetaminophen (TYLENOL) 500 MG tablet Take 1,000 mg by mouth every 6 (six) hours as needed.   aspirin 81 MG chewable tablet Chew 81 mg by mouth daily.   atorvastatin (LIPITOR) 80 MG tablet Take 1 tablet (80 mg total) by mouth daily at 6 PM.   calcium carbonate (OSCAL) 1500 (600 Ca) MG TABS tablet Take 600 mg of elemental calcium by mouth daily with breakfast.    cefdinir (OMNICEF) 300 MG capsule Take 1 capsule (300 mg total) by mouth 2 (two) times daily.   cholecalciferol (VITAMIN D) 1000 UNITS tablet Take 1,000 Units by mouth daily.   Emollient (EUCERIN ADVANCED REPAIR) CREA Apply 1 application  topically 2 (two) times daily.   etanercept (ENBREL) 50 MG/ML injection Inject 50 mg into the skin once a week.   fexofenadine (ALLEGRA) 180 MG tablet Take 180 mg by mouth daily as needed for allergies.   furosemide (LASIX) 20 MG tablet Take 20 mg by mouth daily as needed for fluid or edema.   hydrochlorothiazide (MICROZIDE) 12.5 MG capsule Take 12.5 mg by mouth daily.   insulin glargine (SEMGLEE) 100 UNIT/ML injection Inject 75 Units into the  skin daily.   leflunomide (ARAVA) 20 MG tablet Take 20 mg by mouth daily.   Liraglutide (VICTOZA) 18 MG/3ML SOLN injection Inject 1.8 mg into the skin every evening.    metroNIDAZOLE (FLAGYL) 500 MG tablet Take 1 tablet (500 mg total) by mouth 2 (two) times daily for 7 days.   morphine (MS CONTIN) 100 MG 12 hr tablet Take 100 mg by mouth every 12 (twelve) hours.   nicotine (NICODERM CQ - DOSED IN MG/24 HOURS) 21 mg/24hr patch Place 21 mg onto the skin daily.   ondansetron  (ZOFRAN-ODT) 4 MG disintegrating tablet Take 1 tablet (4 mg total) by mouth every 8 (eight) hours as needed for nausea or vomiting.   predniSONE (DELTASONE) 1 MG tablet Take 2 mg by mouth daily with breakfast.   pregabalin (LYRICA) 150 MG capsule Take 150 mg by mouth 2 (two) times daily.   promethazine (PHENERGAN) 25 MG tablet Take 25 mg by mouth every 8 (eight) hours as needed for nausea or vomiting.   promethazine (PHENERGAN) 25 MG/ML injection Inject 0.5 mLs (12.5 mg total) into the vein once as needed for nausea or vomiting. If not responding to oral promethazine.   sodium chloride (OCEAN) 0.65 % SOLN nasal spray Place 1 spray into both nostrils 4 (four) times daily as needed for congestion.   Sodium Fluoride (PREVIDENT 5000 BOOSTER PLUS DT) Place 1 application  onto teeth every evening.   vitamin B-12 (CYANOCOBALAMIN) 100 MCG tablet Take 100 mcg by mouth daily.     Allergies  Allergen Reactions   Varenicline Nausea Only    Social History   Socioeconomic History   Marital status: Divorced    Spouse name: Not on file   Number of children: Not on file   Years of education: Not on file   Highest education level: Not on file  Occupational History   Not on file  Tobacco Use   Smoking status: Every Day    Packs/day: 1.00    Years: 47.00    Additional pack years: 0.00    Total pack years: 47.00    Types: Cigarettes    Start date: 03/04/1965   Smokeless tobacco: Never  Vaping Use   Vaping Use: Never used  Substance and Sexual Activity   Alcohol use: No   Drug use: No   Sexual activity: Not Currently  Other Topics Concern   Not on file  Social History Narrative   Not on file   Social Determinants of Health   Financial Resource Strain: Not on file  Food Insecurity: No Food Insecurity (08/20/2022)   Hunger Vital Sign    Worried About Running Out of Food in the Last Year: Never true    Ran Out of Food in the Last Year: Never true  Transportation Needs: No Transportation Needs  (08/20/2022)   PRAPARE - Administrator, Civil Service (Medical): No    Lack of Transportation (Non-Medical): No  Physical Activity: Not on file  Stress: Not on file  Social Connections: Not on file  Intimate Partner Violence: Not At Risk (08/20/2022)   Humiliation, Afraid, Rape, and Kick questionnaire    Fear of Current or Ex-Partner: No    Emotionally Abused: No    Physically Abused: No    Sexually Abused: No     Review of Systems: General: negative for chills, fever, night sweats or weight changes.  Cardiovascular: negative for chest pain, dyspnea on exertion, edema, orthopnea, palpitations, paroxysmal nocturnal dyspnea or shortness of breath  Dermatological: negative for rash Respiratory: negative for cough or wheezing Urologic: negative for hematuria Abdominal: negative for nausea, vomiting, diarrhea, bright red blood per rectum, melena, or hematemesis Neurologic: negative for visual changes, syncope, or dizziness All other systems reviewed and are otherwise negative except as noted above.    Blood pressure 120/62, pulse (!) 102, height 6\' 1"  (1.854 m), weight 163 lb (73.9 kg), SpO2 96 %.  General appearance: alert and no distress Neck: no adenopathy, no JVD, supple, symmetrical, trachea midline, thyroid not enlarged, symmetric, no tenderness/mass/nodules, and no carotid bruits Lungs: clear to auscultation bilaterally Heart: regular rate and rhythm, S1, S2 normal, no murmur, click, rub or gallop Extremities: extremities normal, atraumatic, no cyanosis or edema and status post right AKA.  Stump is healed. Pulses: Right AKA, diminished left pedal pulse Skin: Skin color, texture, turgor normal. No rashes or lesions Neurologic: Grossly normal  EKG EKG Interpretation  Date/Time:  Thursday August 22 2022 13:01:08 EDT Ventricular Rate:  102 PR Interval:  162 QRS Duration: 112 QT Interval:  356 QTC Calculation: 463 R Axis:   -47 Text Interpretation: Sinus tachycardia  Left axis deviation Left ventricular hypertrophy with repolarization abnormality ( R in aVL , Cornell product ) When compared with ECG of 20-Aug-2022 07:49, PREVIOUS ECG IS PRESENT Confirmed by Nanetta Batty [phone]) on 08/22/2022 1:07:09 PM    ASSESSMENT AND PLAN:   Critical lower limb ischemia History of critical limb ischemia status post right BKA by Dr. Due to in 2005.  I intervened on his right SFA because of a stump ulcer 05/05/2017 and again 10/19/2018.  He ultimately had a right AKA and his Doppler showed occlusion of his right SFA.  He is wheelchair-bound bound.  Recent Doppler studies reveal a left ABI of 0.98.  Tobacco abuse counseling Ongoing tobacco abuse of 1-1/2 packs a day recalcitrant to resector modification.  Hyperlipidemia History of hyperlipidemia on statin therapy.  He has not had a lipid profile in close to 3 years.  Will recheck a lipid liver profile today.  Carotid artery disease (HCC) History of carotid artery disease with Dopplers performed 11/07/2021 showing moderate bilateral ICA stenosis.  He does have bilateral carotid bruits.  We will recheck carotid Dopplers this coming September.     [name] MD FACP,FACC,FAHA, [facility] 08/22/2022 1:15 PM

## 2022-08-22 NOTE — Assessment & Plan Note (Signed)
History of hyperlipidemia on statin therapy.  He has not had a lipid profile in close to 3 years.  Will recheck a lipid liver profile today.

## 2022-08-22 NOTE — Patient Instructions (Signed)
Medication Instructions:  Your physician recommends that you continue on your current medications as directed. Please refer to the Current Medication list given to you today.  *If you need a refill on your cardiac medications before your next appointment, please call your pharmacy*   Lab Work: TODAY: LIPID AND LFTS If you have labs (blood work) drawn today and your tests are completely normal, you will receive your results only by: MyChart Message (if you have MyChart) OR A paper copy in the mail If you have any lab test that is abnormal or we need to change your treatment, we will call you to review the results.   Testing/Procedures: Your physician has requested that you have a carotid duplex. This test is an ultrasound of the carotid arteries in your neck. It looks at blood flow through these arteries that supply the brain with blood. Allow one hour for this exam. There are no restrictions or special instructions. TO BE DONE IN SEPT 2024   Follow-Up: At [facility], you and your health needs are our priority.  As part of our continuing mission to provide you with exceptional heart care, we have created designated Provider Care Teams.  These Care Teams include your primary Cardiologist (physician) and Advanced Practice Providers (APPs -  Physician Assistants and Nurse Practitioners) who all work together to provide you with the care you need, when you need it.  We recommend signing up for the patient portal called "MyChart".  Sign up information is provided on this After Visit Summary.  MyChart is used to connect with patients for Virtual Visits (Telemedicine).  Patients are able to view lab/test results, encounter notes, upcoming appointments, etc.  Non-urgent messages can be sent to your provider as well.   To learn more about what you can do with MyChart, go to [email or web address].    Your next appointment:   1 year(s)  Provider:   DR. Allyson Sabal

## 2022-08-23 LAB — HEPATIC FUNCTION PANEL
ALT: 49 IU/L — ABNORMAL HIGH (ref 0–44)
AST: 38 IU/L (ref 0–40)
Albumin: 4 g/dL (ref 3.8–4.8)
Alkaline Phosphatase: 76 IU/L (ref 44–121)
Bilirubin Total: 0.4 mg/dL (ref 0.0–1.2)
Bilirubin, Direct: 0.17 mg/dL (ref 0.00–0.40)
Total Protein: 6.8 g/dL (ref 6.0–8.5)

## 2022-08-31 ENCOUNTER — Emergency Department (HOSPITAL_COMMUNITY): Payer: Medicare (Managed Care)

## 2022-08-31 ENCOUNTER — Encounter (HOSPITAL_COMMUNITY): Payer: Self-pay

## 2022-08-31 ENCOUNTER — Other Ambulatory Visit: Payer: Self-pay

## 2022-08-31 ENCOUNTER — Inpatient Hospital Stay (HOSPITAL_COMMUNITY): Payer: Medicare (Managed Care)

## 2022-08-31 ENCOUNTER — Inpatient Hospital Stay (HOSPITAL_COMMUNITY)
Admission: EM | Admit: 2022-08-31 | Discharge: 2022-09-02 | DRG: 066 | Disposition: A | Discharge status: Home / Self Care | Payer: Medicare (Managed Care) | Attending: Internal Medicine | Admitting: Internal Medicine

## 2022-08-31 DIAGNOSIS — I63032 Cerebral infarction due to thrombosis of left carotid artery: Secondary | ICD-10-CM | POA: Diagnosis not present

## 2022-08-31 DIAGNOSIS — M069 Rheumatoid arthritis, unspecified: Secondary | ICD-10-CM | POA: Diagnosis present

## 2022-08-31 DIAGNOSIS — R29701 NIHSS score 1: Secondary | ICD-10-CM | POA: Diagnosis not present

## 2022-08-31 DIAGNOSIS — Z89511 Acquired absence of right leg below knee: Secondary | ICD-10-CM | POA: Diagnosis not present

## 2022-08-31 DIAGNOSIS — F17210 Nicotine dependence, cigarettes, uncomplicated: Secondary | ICD-10-CM | POA: Diagnosis present

## 2022-08-31 DIAGNOSIS — I708 Atherosclerosis of other arteries: Secondary | ICD-10-CM | POA: Diagnosis present

## 2022-08-31 DIAGNOSIS — M1990 Unspecified osteoarthritis, unspecified site: Secondary | ICD-10-CM | POA: Diagnosis present

## 2022-08-31 DIAGNOSIS — J449 Chronic obstructive pulmonary disease, unspecified: Secondary | ICD-10-CM | POA: Diagnosis present

## 2022-08-31 DIAGNOSIS — I639 Cerebral infarction, unspecified: Secondary | ICD-10-CM | POA: Diagnosis not present

## 2022-08-31 DIAGNOSIS — Z89611 Acquired absence of right leg above knee: Secondary | ICD-10-CM | POA: Diagnosis not present

## 2022-08-31 DIAGNOSIS — Z7982 Long term (current) use of aspirin: Secondary | ICD-10-CM

## 2022-08-31 DIAGNOSIS — Z79899 Other long term (current) drug therapy: Secondary | ICD-10-CM

## 2022-08-31 DIAGNOSIS — Z7952 Long term (current) use of systemic steroids: Secondary | ICD-10-CM

## 2022-08-31 DIAGNOSIS — Z794 Long term (current) use of insulin: Secondary | ICD-10-CM | POA: Diagnosis not present

## 2022-08-31 DIAGNOSIS — E785 Hyperlipidemia, unspecified: Secondary | ICD-10-CM | POA: Diagnosis present

## 2022-08-31 DIAGNOSIS — E1151 Type 2 diabetes mellitus with diabetic peripheral angiopathy without gangrene: Secondary | ICD-10-CM | POA: Diagnosis present

## 2022-08-31 DIAGNOSIS — I5030 Unspecified diastolic (congestive) heart failure: Secondary | ICD-10-CM | POA: Diagnosis not present

## 2022-08-31 DIAGNOSIS — I63532 Cerebral infarction due to unspecified occlusion or stenosis of left posterior cerebral artery: Principal | ICD-10-CM | POA: Diagnosis present

## 2022-08-31 DIAGNOSIS — R000 Tachycardia, unspecified: Secondary | ICD-10-CM | POA: Diagnosis present

## 2022-08-31 DIAGNOSIS — E538 Deficiency of other specified B group vitamins: Secondary | ICD-10-CM | POA: Diagnosis present

## 2022-08-31 DIAGNOSIS — E1165 Type 2 diabetes mellitus with hyperglycemia: Secondary | ICD-10-CM | POA: Diagnosis present

## 2022-08-31 DIAGNOSIS — Z91199 Patient's noncompliance with other medical treatment and regimen due to unspecified reason: Secondary | ICD-10-CM | POA: Diagnosis not present

## 2022-08-31 DIAGNOSIS — I6523 Occlusion and stenosis of bilateral carotid arteries: Secondary | ICD-10-CM | POA: Diagnosis present

## 2022-08-31 DIAGNOSIS — G4730 Sleep apnea, unspecified: Secondary | ICD-10-CM | POA: Diagnosis present

## 2022-08-31 DIAGNOSIS — E1140 Type 2 diabetes mellitus with diabetic neuropathy, unspecified: Secondary | ICD-10-CM | POA: Diagnosis present

## 2022-08-31 DIAGNOSIS — I700 Atherosclerosis of aorta: Secondary | ICD-10-CM | POA: Diagnosis present

## 2022-08-31 DIAGNOSIS — R739 Hyperglycemia, unspecified: Secondary | ICD-10-CM

## 2022-08-31 DIAGNOSIS — R29702 NIHSS score 2: Secondary | ICD-10-CM | POA: Diagnosis present

## 2022-08-31 DIAGNOSIS — I10 Essential (primary) hypertension: Secondary | ICD-10-CM | POA: Diagnosis present

## 2022-08-31 DIAGNOSIS — I119 Hypertensive heart disease without heart failure: Secondary | ICD-10-CM | POA: Diagnosis present

## 2022-08-31 DIAGNOSIS — Z993 Dependence on wheelchair: Secondary | ICD-10-CM

## 2022-08-31 DIAGNOSIS — R233 Spontaneous ecchymoses: Secondary | ICD-10-CM | POA: Diagnosis present

## 2022-08-31 DIAGNOSIS — G8929 Other chronic pain: Secondary | ICD-10-CM | POA: Diagnosis present

## 2022-08-31 DIAGNOSIS — I6522 Occlusion and stenosis of left carotid artery: Secondary | ICD-10-CM | POA: Diagnosis not present

## 2022-08-31 DIAGNOSIS — I2510 Atherosclerotic heart disease of native coronary artery without angina pectoris: Secondary | ICD-10-CM | POA: Diagnosis present

## 2022-08-31 DIAGNOSIS — I6340 Cerebral infarction due to embolism of unspecified cerebral artery: Secondary | ICD-10-CM | POA: Diagnosis not present

## 2022-08-31 DIAGNOSIS — Z7985 Long-term (current) use of injectable non-insulin antidiabetic drugs: Secondary | ICD-10-CM

## 2022-08-31 DIAGNOSIS — G936 Cerebral edema: Secondary | ICD-10-CM | POA: Diagnosis present

## 2022-08-31 DIAGNOSIS — Z79891 Long term (current) use of opiate analgesic: Secondary | ICD-10-CM

## 2022-08-31 DIAGNOSIS — Z7902 Long term (current) use of antithrombotics/antiplatelets: Secondary | ICD-10-CM

## 2022-08-31 DIAGNOSIS — H53461 Homonymous bilateral field defects, right side: Secondary | ICD-10-CM | POA: Diagnosis present

## 2022-08-31 DIAGNOSIS — H5461 Unqualified visual loss, right eye, normal vision left eye: Secondary | ICD-10-CM | POA: Diagnosis not present

## 2022-08-31 DIAGNOSIS — Z89422 Acquired absence of other left toe(s): Secondary | ICD-10-CM

## 2022-08-31 LAB — COMPREHENSIVE METABOLIC PANEL
ALT: 45 U/L — ABNORMAL HIGH (ref 0–44)
AST: 23 U/L (ref 15–41)
Albumin: 3.4 g/dL — ABNORMAL LOW (ref 3.5–5.0)
Alkaline Phosphatase: 80 U/L (ref 38–126)
Anion gap: 15 (ref 5–15)
BUN: 11 mg/dL (ref 8–23)
CO2: 28 mmol/L (ref 22–32)
Calcium: 9.2 mg/dL (ref 8.9–10.3)
Chloride: 91 mmol/L — ABNORMAL LOW (ref 98–111)
Creatinine, Ser: 0.97 mg/dL (ref 0.61–1.24)
GFR, Estimated: >60 mL/min (ref >60)
Glucose, Bld: 454 mg/dL — ABNORMAL HIGH (ref 70–99)
Potassium: 3.8 mmol/L (ref 3.5–5.1)
Sodium: 134 mmol/L — ABNORMAL LOW (ref 135–145)
Total Bilirubin: 0.7 mg/dL (ref 0.3–1.2)
Total Protein: 7.1 g/dL (ref 6.5–8.1)

## 2022-08-31 LAB — CBC
HCT: 44.1 % (ref 39.0–52.0)
HCT: 44.3 % (ref 39.0–52.0)
Hemoglobin: 14.8 g/dL (ref 13.0–17.0)
Hemoglobin: 14.6 g/dL (ref 13.0–17.0)
MCH: 29.8 pg (ref 26.0–34.0)
MCH: 29.3 pg (ref 26.0–34.0)
MCHC: 33.6 g/dL (ref 30.0–36.0)
MCHC: 33 g/dL (ref 30.0–36.0)
MCV: 88.9 fL (ref 80.0–100.0)
MCV: 89 fL (ref 80.0–100.0)
Platelets: 242 10*3/uL (ref 150–400)
Platelets: 243 10*3/uL (ref 150–400)
RBC: 4.96 MIL/uL (ref 4.22–5.81)
RBC: 4.98 MIL/uL (ref 4.22–5.81)
RDW: 14.9 % (ref 11.5–15.5)
RDW: 14.8 % (ref 11.5–15.5)
WBC: 7 10*3/uL (ref 4.0–10.5)
WBC: 6.6 10*3/uL (ref 4.0–10.5)
nRBC: 0 % (ref 0.0–0.2)
nRBC: 0 % (ref 0.0–0.2)

## 2022-08-31 LAB — DIFFERENTIAL
Abs Immature Granulocytes: 0.02 10*3/uL (ref 0.00–0.07)
Basophils Absolute: 0.1 10*3/uL (ref 0.0–0.1)
Basophils Relative: 1 %
Eosinophils Absolute: 0.3 10*3/uL (ref 0.0–0.5)
Eosinophils Relative: 5 %
Immature Granulocytes: 0 %
Lymphocytes Relative: 17 %
Lymphs Abs: 1.2 10*3/uL (ref 0.7–4.0)
Monocytes Absolute: 0.7 10*3/uL (ref 0.1–1.0)
Monocytes Relative: 10 %
Neutro Abs: 4.7 10*3/uL (ref 1.7–7.7)
Neutrophils Relative %: 67 %

## 2022-08-31 LAB — APTT: aPTT: 24 seconds (ref 24–36)

## 2022-08-31 LAB — GLUCOSE, CAPILLARY: Glucose-Capillary: 239 mg/dL — ABNORMAL HIGH (ref 70–99)

## 2022-08-31 LAB — CREATININE, SERUM
Creatinine, Ser: 0.77 mg/dL (ref 0.61–1.24)
GFR, Estimated: >60 mL/min (ref >60)

## 2022-08-31 LAB — CBG MONITORING, ED: Glucose-Capillary: 396 mg/dL — ABNORMAL HIGH (ref 70–99)

## 2022-08-31 LAB — PROTIME-INR
INR: 1 (ref 0.8–1.2)
Prothrombin Time: 13.6 seconds (ref 11.4–15.2)

## 2022-08-31 LAB — ETHANOL: Alcohol, Ethyl (B): <10 mg/dL (ref <10)

## 2022-08-31 MED ORDER — ASPIRIN 325 MG PO TABS
325.0000 mg | ORAL_TABLET | Freq: Once | ORAL | Status: AC
  Administered 2022-09-01: 325 mg via ORAL
  Filled 2022-08-31: qty 1

## 2022-08-31 MED ORDER — STROKE: EARLY STAGES OF RECOVERY BOOK
Freq: Once | Status: AC
  Filled 2022-08-31: qty 1

## 2022-08-31 MED ORDER — MORPHINE SULFATE ER 100 MG PO TBCR
100.0000 mg | EXTENDED_RELEASE_TABLET | Freq: Two times a day (BID) | ORAL | Status: DC
  Administered 2022-09-01 (×2): 100 mg via ORAL
  Filled 2022-08-31 (×3): qty 1

## 2022-08-31 MED ORDER — HYDROCHLOROTHIAZIDE 12.5 MG PO TABS
12.5000 mg | ORAL_TABLET | Freq: Every day | ORAL | Status: DC
  Administered 2022-09-01: 12.5 mg via ORAL
  Filled 2022-08-31: qty 1

## 2022-08-31 MED ORDER — ASPIRIN 81 MG PO TBEC
81.0000 mg | DELAYED_RELEASE_TABLET | Freq: Every day | ORAL | Status: DC
  Administered 2022-09-01: 81 mg via ORAL
  Filled 2022-08-31: qty 1

## 2022-08-31 MED ORDER — PREGABALIN 75 MG PO CAPS
150.0000 mg | ORAL_CAPSULE | Freq: Two times a day (BID) | ORAL | Status: DC
  Administered 2022-08-31 – 2022-09-01 (×3): 150 mg via ORAL
  Filled 2022-08-31 (×3): qty 2

## 2022-08-31 MED ORDER — ATORVASTATIN CALCIUM 80 MG PO TABS
80.0000 mg | ORAL_TABLET | Freq: Every day | ORAL | Status: DC
  Administered 2022-09-01: 80 mg via ORAL
  Filled 2022-08-31: qty 1

## 2022-08-31 MED ORDER — INSULIN ASPART 100 UNIT/ML IJ SOLN
0.0000 [IU] | Freq: Three times a day (TID) | INTRAMUSCULAR | Status: DC
  Administered 2022-08-31: 20 [IU] via SUBCUTANEOUS
  Administered 2022-09-01 (×2): 2 [IU] via SUBCUTANEOUS

## 2022-08-31 MED ORDER — ACETAMINOPHEN 325 MG PO TABS: 650.0000 mg | ORAL_TABLET | ORAL | Status: DC | PRN

## 2022-08-31 MED ORDER — PREDNISONE 1 MG PO TABS
2.0000 mg | ORAL_TABLET | Freq: Every day | ORAL | Status: DC
  Administered 2022-09-01: 2 mg via ORAL
  Filled 2022-08-31 (×3): qty 2

## 2022-08-31 MED ORDER — INSULIN DETEMIR 100 UNIT/ML ~~LOC~~ SOLN
30.0000 [IU] | Freq: Two times a day (BID) | SUBCUTANEOUS | Status: DC
  Administered 2022-08-31 – 2022-09-01 (×2): 30 [IU] via SUBCUTANEOUS
  Filled 2022-08-31 (×5): qty 0.3

## 2022-08-31 MED ORDER — CLOPIDOGREL BISULFATE 75 MG PO TABS
75.0000 mg | ORAL_TABLET | Freq: Every day | ORAL | Status: DC
  Administered 2022-09-01: 75 mg via ORAL
  Filled 2022-08-31: qty 1

## 2022-08-31 MED ORDER — CLOPIDOGREL BISULFATE 75 MG PO TABS
300.0000 mg | ORAL_TABLET | Freq: Once | ORAL | Status: AC
  Administered 2022-09-01: 300 mg via ORAL
  Filled 2022-08-31: qty 4

## 2022-08-31 MED ORDER — ACETAMINOPHEN 160 MG/5ML PO SOLN: 650.0000 mg | ORAL | Status: DC | PRN

## 2022-08-31 MED ORDER — HYDROCHLOROTHIAZIDE 12.5 MG PO CAPS: 12.5000 mg | ORAL_CAPSULE | Freq: Every day | ORAL | Status: DC

## 2022-08-31 MED ORDER — LEFLUNOMIDE 10 MG PO TABS
20.0000 mg | ORAL_TABLET | Freq: Every day | ORAL | Status: DC
  Administered 2022-08-31 – 2022-09-01 (×2): 20 mg via ORAL
  Filled 2022-08-31 (×3): qty 2

## 2022-08-31 MED ORDER — NICOTINE 21 MG/24HR TD PT24
21.0000 mg | MEDICATED_PATCH | Freq: Every day | TRANSDERMAL | Status: DC
  Administered 2022-09-01 (×2): 21 mg via TRANSDERMAL
  Filled 2022-08-31 (×2): qty 1

## 2022-08-31 MED ORDER — ENOXAPARIN SODIUM 40 MG/0.4ML IJ SOSY
40.0000 mg | PREFILLED_SYRINGE | INTRAMUSCULAR | Status: DC
  Administered 2022-08-31 – 2022-09-01 (×2): 40 mg via SUBCUTANEOUS
  Filled 2022-08-31 (×2): qty 0.4

## 2022-08-31 MED ORDER — ACETAMINOPHEN 650 MG RE SUPP: 650.0000 mg | RECTAL | Status: DC | PRN

## 2022-08-31 NOTE — ED Triage Notes (Signed)
Pt c/o loss of peripheral vision in R eye x2 days.  Denies pain and injury.

## 2022-08-31 NOTE — ED Notes (Signed)
Spoke to nurse, Eduardo Osier on the phone. Report given and updated about morphine withheld.

## 2022-08-31 NOTE — ED Provider Notes (Signed)
[facility] DEPARTMENT AT [facility] Provider Note   CSN: [id number] Arrival date & time: 08/31/22  1715     History {Add pertinent medical, surgical, social history, OB history to HPI:1} Chief Complaint  Patient presents with   Loss of Vision    Brian Zavala is a 72 y.o. male.  He is brought in by his sister for evaluation of visual change.  He wears glasses, has a history of diabetes, rheumatoid arthritis, AKA on the right.  He feels like since Thursday he has had diminished vision in his right eye.  No eye pain.  No headache.  No known trauma.  No other numbness or weakness no difficulty with speech.  No prior history of same.  Says he feels very tired otherwise.  No other illness acute.  Takes aspirin no other blood thinners identified  The history is provided by the patient.  Cerebrovascular Accident This is a new problem. The current episode started more than 2 days ago. The problem occurs constantly. The problem has not changed since onset.Pertinent negatives include no chest pain, no abdominal pain, no headaches and no shortness of breath. Nothing aggravates the symptoms. Nothing relieves the symptoms. He has tried rest for the symptoms. The treatment provided no relief.       Home Medications Prior to Admission medications   Medication Sig Start Date End Date Taking? Authorizing Provider  acetaminophen (TYLENOL) 500 MG tablet Take 1,000 mg by mouth every 6 (six) hours as needed.    [provider]  aspirin 81 MG chewable tablet Chew 81 mg by mouth daily.    [provider]  atorvastatin (LIPITOR) 80 MG tablet Take 1 tablet (80 mg total) by mouth daily at 6 PM. 05/06/17   Arty Baumgartner, NP  calcium carbonate (OSCAL) 1500 (600 Ca) MG TABS tablet Take 600 mg of elemental calcium by mouth daily with breakfast.     [provider]  cefdinir (OMNICEF) 300 MG capsule Take 1 capsule (300 mg total) by mouth 2 (two) times daily.  08/21/22   Tyrone Nine, MD  cholecalciferol (VITAMIN D) 1000 UNITS tablet Take 1,000 Units by mouth daily.    [provider]  Emollient (EUCERIN ADVANCED REPAIR) CREA Apply 1 application  topically 2 (two) times daily.    [provider]  etanercept (ENBREL) 50 MG/ML injection Inject 50 mg into the skin once a week.    [provider]  fexofenadine (ALLEGRA) 180 MG tablet Take 180 mg by mouth daily as needed for allergies.    [provider]  furosemide (LASIX) 20 MG tablet Take 20 mg by mouth daily as needed for fluid or edema.    [provider]  hydrochlorothiazide (MICROZIDE) 12.5 MG capsule Take 12.5 mg by mouth daily.    [provider]  insulin glargine (SEMGLEE) 100 UNIT/ML injection Inject 75 Units into the skin daily.    [provider]  leflunomide (ARAVA) 20 MG tablet Take 20 mg by mouth daily.    [provider]  Liraglutide (VICTOZA) 18 MG/3ML SOLN injection Inject 1.8 mg into the skin every evening.     [provider]  metoprolol succinate (TOPROL-XL) 25 MG 24 hr tablet Take 25 mg by mouth every evening. Patient not taking: Reported on 08/22/2022    [provider]  morphine (MS CONTIN) 100 MG 12 hr tablet Take 100 mg by mouth every 12 (twelve) hours.    [provider]  nicotine (NICODERM CQ - DOSED IN MG/24 HOURS) 21 mg/24hr patch Place 21 mg onto the skin daily.    [provider]  ondansetron (ZOFRAN-ODT) 4 MG disintegrating tablet Take 1 tablet (4 mg total) by mouth every 8 (eight) hours as needed for nausea or vomiting. 08/21/22   Tyrone Nine, MD  predniSONE (DELTASONE) 1 MG tablet Take 2 mg by mouth daily with breakfast.    [provider]  pregabalin (LYRICA) 150 MG capsule Take 150 mg by mouth 2 (two) times daily.    [provider]  promethazine (PHENERGAN) 25 MG tablet Take 25 mg by mouth every 8 (eight) hours as needed for nausea or vomiting.     [provider]  promethazine (PHENERGAN) 25 MG/ML injection Inject 0.5 mLs (12.5 mg total) into the vein once as needed for nausea or vomiting. If not responding to oral promethazine. 08/21/22   Tyrone Nine, MD  sodium chloride (OCEAN) 0.65 % SOLN nasal spray Place 1 spray into both nostrils 4 (four) times daily as needed for congestion.    [provider]  Sodium Fluoride (PREVIDENT 5000 BOOSTER PLUS DT) Place 1 application  onto teeth every evening.    [provider]  vitamin B-12 (CYANOCOBALAMIN) 100 MCG tablet Take 100 mcg by mouth daily.    [provider]      Allergies    Varenicline    Review of Systems   Review of Systems  Constitutional:  Positive for fatigue. Negative for fever.  HENT:  Negative for sore throat.   Eyes:  Positive for visual disturbance. Negative for pain.  Respiratory:  Negative for shortness of breath.   Cardiovascular:  Negative for chest pain.  Gastrointestinal:  Negative for abdominal pain.  Genitourinary:  Negative for dysuria.  Skin:  Negative for rash.  Neurological:  Negative for headaches.    Physical Exam Updated Vital Signs BP 108/77 (BP Location: Right Arm)   Pulse (!) 105   Temp 98.1 F (36.7 C) (Oral)   Resp 20   Ht 6\' 1"  (1.854 m)   Wt 73.9 kg   SpO2 95%   BMI 21.51 kg/m  Physical Exam Vitals and nursing note reviewed.  Constitutional:      General: He is not in acute distress.    Appearance: Normal appearance. He is well-developed.  HENT:     Head: Normocephalic and atraumatic.  Eyes:     General: Lids are normal. Gaze aligned appropriately.     Extraocular Movements: Extraocular movements intact.     Conjunctiva/sclera: Conjunctivae normal.     Pupils: Pupils are equal, round, and reactive to light.     Funduscopic exam:    Right eye: No hemorrhage.        Left eye: No hemorrhage.     Slit lamp exam:    Right eye: Anterior chamber quiet.     Left eye: Anterior chamber quiet.      Visual Fields:     Right eye: ABS in the upper temporal quadrant. CF in the upper nasal quadrant. ABS in the lower temporal quadrant. CF in the lower nasal quadrant.     Left eye: ABS in the upper nasal quadrant. CF in the upper temporal quadrant. ABS in the lower nasal quadrant. CF in the lower temporal quadrant.  Cardiovascular:     Rate and Rhythm: Normal rate and regular rhythm.     Heart sounds: No murmur heard. Pulmonary:     Effort: Pulmonary effort  is normal. No respiratory distress.     Breath sounds: Normal breath sounds.  Abdominal:     Palpations: Abdomen is soft.     Tenderness: There is no abdominal tenderness.  Musculoskeletal:        General: No swelling.     Cervical back: Neck supple.  Skin:    General: Skin is warm and dry.     Capillary Refill: Capillary refill takes less than 2 seconds.  Neurological:     Mental Status: He is alert.  Psychiatric:        Mood and Affect: Mood normal.     ED Results / Procedures / Treatments   Labs (all labs ordered are listed, but only abnormal results are displayed) Labs Reviewed  CBC  DIFFERENTIAL  PROTIME-INR  APTT  COMPREHENSIVE METABOLIC PANEL  ETHANOL    EKG None  Radiology No results found.  Procedures Procedures  {Document cardiac monitor, telemetry assessment procedure when appropriate:1}  Medications Ordered in ED Medications - No data to display  ED Course/ Medical Decision Making/ A&P   {   Click here for ABCD2, HEART and other calculatorsREFRESH Note before signing :1}                          Medical Decision Making Amount and/or Complexity of Data Reviewed Labs: ordered. Radiology: ordered.   This patient complains of ***; this involves an extensive number of treatment Options and is a complaint that carries with it a high risk of complications and morbidity. The differential includes ***  I ordered, reviewed and interpreted labs, which included *** I ordered medication *** and  reviewed PMP when indicated. I ordered imaging studies which included *** and I independently    visualized and interpreted imaging which showed *** Additional history obtained from *** Previous records obtained and reviewed *** I consulted *** and discussed lab and imaging findings and discussed disposition.  Cardiac monitoring reviewed, *** Social determinants considered, *** Critical Interventions: ***  After the interventions stated above, I reevaluated the patient and found *** Admission and further testing considered, ***   {Document critical care time when appropriate:1} {Document review of labs and clinical decision tools ie heart score, Chads2Vasc2 etc:1}  {Document your independent review of radiology images, and any outside records:1} {Document your discussion with family members, caretakers, and with consultants:1} {Document social determinants of health affecting pt's care:1} {Document your decision making why or why not admission, treatments were needed:1} Final Clinical Impression(s) / ED Diagnoses Final diagnoses:  None    Rx / DC Orders ED Discharge Orders     None

## 2022-08-31 NOTE — H&P (Signed)
History and Physical    Brian Zavala ZOX:[id number] DOB: 01/19/1951 DOA: 08/31/2022  PCP: Kermit Balo, DO   Chief Complaint: vision loss  HPI: Brian Zavala is a 72 y.o. male with medical history significant of COPD, alcohol usage, neuropathy, type 2 diabetes who presents emergency due to vision loss.  Patient lost vision in his right eye for the last 2 days.  He presented to the ER for further assessment.  On arrival he was afebrile and hemodynamically stable.  Labs were obtained which revealed WBC 6.6, hemoglobin 14.6, platelets 243, sodium 134, glucose 454.  Patient underwent CT head which showed concern for acute infarct of left occipital lobe.  Neurology was consulted and patient was admitted for further workup.  On evaluation he had a notable right homonymous hemianopsia.  He denied any infectious complaints including fevers or chills.  He continues to smoke several packs of cigarettes per day.  He has been given a BiPAP for sleep apnea however is noncompliant.   Review of Systems: Review of Systems  All other systems reviewed and are negative.    As per HPI otherwise 10 point review of systems negative.   Allergies  Allergen Reactions   Varenicline Nausea Only    Past Medical History:  Diagnosis Date   Arthritis    "pretty much all over"   Cellulitis of left foot    COPD (chronic obstructive pulmonary disease) (HCC)    Dyspnea    Hyperlipidemia    Hypertension    Nausea and vomiting 08/20/2022   Neuromuscular disorder (HCC)    neuropathy legs   Neuropathy    legs   Osteomyelitis (HCC) 04/2018   4th toe left foot   Osteomyelitis of fourth toe of left foot (HCC) 04/19/2018   Peripheral vascular disease (HCC)    Type II diabetes mellitus (HCC)    Wears glasses    Wears partial dentures    top and bottom partials    Past Surgical History:  Procedure Laterality Date   ABDOMINAL AORTOGRAM W/LOWER EXTREMITY Bilateral 10/19/2018   Procedure: ABDOMINAL  AORTOGRAM W/LOWER EXTREMITY;  Surgeon: [name], MD;  Location: [facility];  Service: Cardiovascular;  Laterality: Bilateral;   ABOVE KNEE LEG AMPUTATION Right 11/27/2018   AMPUTATION Left 04/22/2018   Procedure: LEFT FOOT 4TH RAY AMPUTATION;  Surgeon: [name], MD;  Location: [facility] OR;  Service: Orthopedics;  Laterality: Left;   AMPUTATION Right 11/27/2018   Procedure: RIGHT ABOVE KNEE AMPUTATION;  Surgeon: [name], MD;  Location: [facility] OR;  Service: Orthopedics;  Laterality: Right;   BACK SURGERY     CHOLECYSTECTOMY N/A 10/26/2013   Procedure: LAPAROSCOPIC CHOLECYSTECTOMY WITH INTRAOPERATIVE CHOLANGIOGRAM;  Surgeon: Clovis Pu. Cornett, MD;  Location: [facility] OR;  Service: General;  Laterality: N/A;   COLONOSCOPY     CYST EXCISION Bilateral 02/15/2015   Procedure: LEFT INDEX FINGER AND RIGHT MIDDLE FINGER NODULE EXCISION;  Surgeon: Tarry Kos, MD;  Location: [facility];  Service: Orthopedics;  Laterality: Bilateral;   CYST EXCISION PERINEAL N/A 12/08/2012   Procedure: CYST EXCISION PERINeum;  Surgeon: Maisie Fus A. Cornett, MD;  Location: [facility];  Service: General;  Laterality: N/A;   FOOT AMPUTATION Right 2005   I & D EXTREMITY Right 03/07/2017   Procedure: EXCISION FIBULAR HEAD RIGHT BELOW KNEE AMPUTATION;  Surgeon: [name], MD;  Location: [facility];  Service: Orthopedics;  Laterality: Right;   INCISE AND DRAIN ABCESS  12/02/2014  PERINEAL ABSCESS   INCISION AND DRAINAGE PERIRECTAL ABSCESS Left 12/02/2014   Procedure: IRRIGATION AND DEBRIDEMENT PERINEAL ABSCESS;  Surgeon: Abigail Miyamoto, MD;  Location: [facility];  Service: General;  Laterality: Left;   LEG AMPUTATION BELOW KNEE Right 2005   LOWER EXTREMITY ANGIOGRAPHY N/A 05/05/2017   Procedure: LOWER EXTREMITY ANGIOGRAPHY;  Surgeon: [name], MD;  Location: [facility];  Service: Cardiovascular;  Laterality: N/A;   LUMBAR DISC SURGERY  82,90   ruptured disc   PERIPHERAL VASCULAR  INTERVENTION Right 05/05/2017   Procedure: PERIPHERAL VASCULAR INTERVENTION;  Surgeon: [name], MD;  Location: [facility];  Service: Cardiovascular;  Laterality: Right;   PERIPHERAL VASCULAR INTERVENTION Right 10/19/2018   Procedure: PERIPHERAL VASCULAR INTERVENTION;  Surgeon: [name], MD;  Location: [facility];  Service: Cardiovascular;  Laterality: Right;   SCAR REVISION Right 2005   @ amputation   STUMP REVISION Right 08/21/2018   Procedure: REVISION RIGHT BELOW KNEE AMPUTATION, EXCISION FIBULA;  Surgeon: [name], MD;  Location: [facility];  Service: Orthopedics;  Laterality: Right;   STUMP REVISION Right 09/09/2018   Procedure: REVISION RIGHT BELOW KNEE AMPUTATION;  Surgeon: [name], MD;  Location: [facility] OR;  Service: Orthopedics;  Laterality: Right;   TOE AMPUTATION Right 2005     reports that he has been smoking cigarettes. He started smoking about 57 years ago. He has a 47.00 pack-year smoking history. He has never used smokeless tobacco. He reports that he does not drink alcohol and does not use drugs.  Family History  Problem Relation Age of Onset   Cirrhosis Father     Prior to Admission medications   Medication Sig Start Date End Date Taking? Authorizing Provider  acetaminophen (TYLENOL) 500 MG tablet Take 1,000 mg by mouth every 6 (six) hours as needed for moderate pain.   Yes [provider]  aspirin 81 MG chewable tablet Chew 81 mg by mouth daily.   Yes [provider]  atorvastatin (LIPITOR) 80 MG tablet Take 1 tablet (80 mg total) by mouth daily at 6 PM. 05/06/17  Yes Laverda Page B, NP  calcium carbonate (OSCAL) 1500 (600 Ca) MG TABS tablet Take 600 mg of elemental calcium by mouth daily with breakfast.    Yes [provider]  cholecalciferol (VITAMIN D) 1000 UNITS tablet Take 1,000 Units by mouth daily.   Yes [provider]  Emollient (EUCERIN ADVANCED REPAIR) CREA Apply 1 application  topically 2  (two) times daily.   Yes [provider]  etanercept (ENBREL) 50 MG/ML injection Inject 50 mg into the skin once a week.   Yes [provider]  fluticasone (FLONASE) 50 MCG/ACT nasal spray Place 2 sprays into both nostrils daily as needed for allergies or rhinitis.   Yes [provider]  furosemide (LASIX) 20 MG tablet Take 20 mg by mouth daily as needed for fluid or edema.   Yes [provider]  hydrochlorothiazide (MICROZIDE) 12.5 MG capsule Take 12.5 mg by mouth daily.   Yes [provider]  insulin glargine (SEMGLEE) 100 UNIT/ML injection Inject 75 Units into the skin daily.   Yes [provider]  leflunomide (ARAVA) 20 MG tablet Take 20 mg by mouth daily.   Yes [provider]  metoprolol succinate (TOPROL-XL) 25 MG 24 hr tablet Take 25 mg by mouth at bedtime.   Yes [provider]  morphine (MS CONTIN) 100 MG 12 hr tablet Take 100 mg  by mouth every 12 (twelve) hours.   Yes [provider]  nicotine (NICODERM CQ - DOSED IN MG/24 HOURS) 21 mg/24hr patch Place 21 mg onto the skin daily.   Yes [provider]  ondansetron (ZOFRAN-ODT) 4 MG disintegrating tablet Take 1 tablet (4 mg total) by mouth every 8 (eight) hours as needed for nausea or vomiting. 08/21/22  Yes Tyrone Nine, MD  predniSONE (DELTASONE) 1 MG tablet Take 2 mg by mouth daily with breakfast.   Yes [provider]  pregabalin (LYRICA) 150 MG capsule Take 150 mg by mouth 2 (two) times daily.   Yes [provider]  promethazine (PHENERGAN) 25 MG tablet Take 25 mg by mouth every 8 (eight) hours as needed for nausea or vomiting.   Yes [provider]  promethazine (PHENERGAN) 25 MG/ML injection Inject 0.5 mLs (12.5 mg total) into the vein once as needed for nausea or vomiting. If not responding to oral promethazine. 08/21/22  Yes Tyrone Nine, MD  Semaglutide,0.25 or 0.5MG /DOS, (OZEMPIC, 0.25 OR 0.5 MG/DOSE,) 2 MG/1.5ML  SOPN Inject 0.5 mg into the skin once a week.   Yes [provider]  Sodium Fluoride (PREVIDENT 5000 BOOSTER PLUS DT) Place 1 application  onto teeth every evening.   Yes [provider]  vitamin B-12 (CYANOCOBALAMIN) 100 MCG tablet Take 100 mcg by mouth daily.   Yes [provider]  cefdinir (OMNICEF) 300 MG capsule Take 1 capsule (300 mg total) by mouth 2 (two) times daily. Patient not taking: Reported on 08/31/2022 08/21/22   Tyrone Nine, MD    Physical Exam: Vitals:   08/31/22 1900 08/31/22 2045 08/31/22 2131 08/31/22 2200  BP: 101/79 118/78  122/89  Pulse: 99 98  99  Resp: 20 12  19   Temp:   98.5 F (36.9 C) 97.7 F (36.5 C)  TempSrc:   Oral Oral  SpO2: 96% 96%  94%  Weight:      Height:       Physical Exam Constitutional:      Appearance: He is normal weight.  HENT:     Head: Normocephalic.     Right Ear: Tympanic membrane normal.     Nose: Nose normal.     Mouth/Throat:     Mouth: Mucous membranes are dry.  Eyes:     Extraocular Movements: Extraocular movements intact.     Pupils: Pupils are equal, round, and reactive to light.  Cardiovascular:     Rate and Rhythm: Normal rate and regular rhythm.  Pulmonary:     Effort: Pulmonary effort is normal.     Breath sounds: Normal breath sounds.  Abdominal:     General: Abdomen is flat.     Palpations: Abdomen is soft.  Musculoskeletal:     Cervical back: Normal range of motion.  Skin:    General: Skin is warm.     Capillary Refill: Capillary refill takes less than 2 seconds.  Neurological:     Mental Status: He is alert.     Cranial Nerves: Cranial nerve deficit present.     Comments: Vision loss on right   Psychiatric:        Mood and Affect: Mood normal.       Labs on Admission: I have personally reviewed the patients's labs and imaging studies.  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 Inpatient status is judged to be reasonable and necessary in order to provide the required intensity of service to ensure the patient's safety. The patient's presenting symptoms, physical exam findings, and initial radiographic and laboratory data in the context of their chronic comorbidities is felt to place them at high risk for further clinical deterioration. Furthermore, it is not anticipated that the patient will be medically stable for discharge from the hospital within 2 midnights of admission.   * I certify that at the point of admission it is my clinical judgment that the patient will require inpatient hospital care spanning beyond 2 midnights from the point of admission due to high intensity of service, high risk for further deterioration and high frequency of surveillance required.Alan Mulder MD [facility] If 7PM-7AM, please contact night-coverage [email or web address]  08/31/2022, 10:51 PM

## 2022-08-31 NOTE — ED Notes (Signed)
ED TO INPATIENT HANDOFF REPORT  ED Nurse Name and Phone #: Dahlia Client Paramedic and Riki Rusk RN [phone]  S Name/Age/Gender Brian Zavala 72 y.o. male Room/Bed: TRACC/TRACC  Code Status   Code Status: Full Code  Home/SNF/Other Home Patient oriented to: self, place, time, and situation Is this baseline? Yes   Triage Complete: Triage complete  Chief Complaint CVA (cerebral vascular accident) [facility]) [I63.9]  Triage Note Pt c/o loss of peripheral vision in R eye x2 days.  Denies pain and injury.     Allergies Allergies  Allergen Reactions   Varenicline Nausea Only    Level of Care/Admitting Diagnosis ED Disposition     ED Disposition  Admit   Condition  --   Comment  Hospital Area: [facility] [[id number]]  Level of Care: Telemetry Medical [104]  May admit patient to [facility] or [facility] if equivalent level of care is available:: Yes  Covid Evaluation: Asymptomatic - no recent exposure (last 10 days) testing not required  Diagnosis: CVA (cerebral vascular accident) [facility]) [[id number]]  Admitting Physician: Alan Mulder [[id number]]  Attending Physician: Alan Mulder [phone]  Certification:: I certify this patient will need inpatient services for at least 2 midnights  Estimated Length of Stay: 3          B Medical/Surgery History Past Medical History:  Diagnosis Date   Arthritis    "pretty much all over"   Cellulitis of left foot    COPD (chronic obstructive pulmonary disease) (HCC)    Dyspnea    Hyperlipidemia    Hypertension    Nausea and vomiting 08/20/2022   Neuromuscular disorder (HCC)    neuropathy legs   Neuropathy    legs   Osteomyelitis (HCC) 04/2018   4th toe left foot   Osteomyelitis of fourth toe of left foot (HCC) 04/19/2018   Peripheral vascular disease (HCC)    Type II diabetes mellitus (HCC)    Wears glasses    Wears partial dentures    top and bottom partials   Past Surgical History:  Procedure Laterality Date    ABDOMINAL AORTOGRAM W/LOWER EXTREMITY Bilateral 10/19/2018   Procedure: ABDOMINAL AORTOGRAM W/LOWER EXTREMITY;  Surgeon: [name], MD;  [facility]: [facility];  Service: Cardiovascular;  Laterality: Bilateral;   ABOVE KNEE LEG AMPUTATION Right 11/27/2018   AMPUTATION Left 04/22/2018   Procedure: LEFT FOOT 4TH RAY AMPUTATION;  Surgeon: [name], MD;  Location: [facility] OR;  Service: Orthopedics;  Laterality: Left;   AMPUTATION Right 11/27/2018   Procedure: RIGHT ABOVE KNEE AMPUTATION;  Surgeon: [name], MD;  Location: [facility] OR;  Service: Orthopedics;  Laterality: Right;   BACK SURGERY     CHOLECYSTECTOMY N/A 10/26/2013   Procedure: LAPAROSCOPIC CHOLECYSTECTOMY WITH INTRAOPERATIVE CHOLANGIOGRAM;  Surgeon: Clovis Pu. Cornett, MD;  Location: [facility] OR;  Service: General;  Laterality: N/A;   COLONOSCOPY     CYST EXCISION Bilateral 02/15/2015   Procedure: LEFT INDEX FINGER AND RIGHT MIDDLE FINGER NODULE EXCISION;  Surgeon: Tarry Kos, MD;  Location: [facility];  Service: Orthopedics;  Laterality: Bilateral;   CYST EXCISION PERINEAL N/A 12/08/2012   Procedure: CYST EXCISION PERINeum;  Surgeon: Maisie Fus A. Cornett, MD;  Location: [facility];  Service: General;  Laterality: N/A;   FOOT AMPUTATION Right 2005   I & D EXTREMITY Right 03/07/2017   Procedure: EXCISION FIBULAR HEAD RIGHT BELOW KNEE AMPUTATION;  Surgeon: [name], MD;  Location: [facility];  Service: Orthopedics;  Laterality: Right;   INCISE AND DRAIN ABCESS  12/02/2014   PERINEAL ABSCESS   INCISION AND DRAINAGE PERIRECTAL ABSCESS Left 12/02/2014   Procedure: IRRIGATION AND DEBRIDEMENT PERINEAL ABSCESS;  Surgeon: Abigail Miyamoto, MD;  Location: [facility] OR;  Service: General;  Laterality: Left;   LEG AMPUTATION BELOW KNEE Right 2005   LOWER EXTREMITY ANGIOGRAPHY N/A 05/05/2017   Procedure: LOWER EXTREMITY ANGIOGRAPHY;  Surgeon: [name], MD;  [facility];  Service: Cardiovascular;   Laterality: N/A;   LUMBAR DISC SURGERY  82,90   ruptured disc   PERIPHERAL VASCULAR INTERVENTION Right 05/05/2017   Procedure: PERIPHERAL VASCULAR INTERVENTION;  Surgeon: [name], MD;  [facility]: [facility];  Service: Cardiovascular;  Laterality: Right;   PERIPHERAL VASCULAR INTERVENTION Right 10/19/2018   Procedure: PERIPHERAL VASCULAR INTERVENTION;  Surgeon: [name], MD;  [facility];  Service: Cardiovascular;  Laterality: Right;   SCAR REVISION Right 2005   @ amputation   STUMP REVISION Right 08/21/2018   Procedure: REVISION RIGHT BELOW KNEE AMPUTATION, EXCISION FIBULA;  Surgeon: [name], MD;  Location: [facility];  Service: Orthopedics;  Laterality: Right;   STUMP REVISION Right 09/09/2018   Procedure: REVISION RIGHT BELOW KNEE AMPUTATION;  Surgeon: [name], MD;  Location: [facility] OR;  Service: Orthopedics;  Laterality: Right;   TOE AMPUTATION Right 2005     A IV Location/Drains/Wounds Patient Lines/Drains/Airways Status     Active Line/Drains/Airways     Name Placement date Placement time Site Days   Peripheral IV 08/31/22 20 G 1" Anterior;Left Forearm 08/31/22  2016  Forearm  less than 1   Negative Pressure Wound Therapy Leg Right 08/21/18  0845  --  1471   Negative Pressure Wound Therapy Leg Right 09/09/18  1403  --  1452   Wound / Incision (Open or Dehisced) 10/19/18 Incision - Open Leg Right  10/19/18  1645  Leg  1412   Wound / Incision (Open or Dehisced) 08/20/22 Pretibial Left;Lateral skin tear 08/20/22  1610  Pretibial  11            Intake/Output Last 24 hours No intake or output data in the 24 hours ending 08/31/22 2021  Labs/Imaging Results for orders placed or performed during the hospital encounter of 08/31/22 (from the past 48 hour(s))  Protime-INR     Status: None   Collection Time: 08/31/22  5:35 PM  Result Value Ref Range   Prothrombin Time 13.6 11.4 - 15.2 seconds   INR 1.0 0.8 - 1.2    Comment: (NOTE) INR  goal varies based on device and disease states. Performed at [facility], [address], [place], [place] [address]   APTT     Status: None   Collection Time: 08/31/22  5:35 PM  Result Value Ref Range   aPTT 24 24 - 36 seconds    Comment: Performed at [facility], [address] [place], [place] [address]  CBC     Status: None   Collection Time: 08/31/22  5:35 PM  Result Value Ref Range   WBC 7.0 4.0 - 10.5 K/uL   RBC 4.96 4.22 - 5.81 MIL/uL   Hemoglobin 14.8 13.0 - 17.0 g/dL   HCT 09.8 11.9 - 14.7 %   MCV 88.9 80.0 - 100.0 fL   MCH 29.8 26.0 - 34.0 pg   MCHC 33.6 30.0 - 36.0 g/dL   RDW 82.9 56.2 - 13.0 %  Platelets 242 150 - 400 K/uL   nRBC 0.0 0.0 - 0.2 %    Comment: Performed at [facility], [address] [place], [place] [address]  Differential     Status: None   Collection Time: 08/31/22  5:35 PM  Result Value Ref Range   Neutrophils Relative % 67 %   Neutro Abs 4.7 1.7 - 7.7 K/uL   Lymphocytes Relative 17 %   Lymphs Abs 1.2 0.7 - 4.0 K/uL   Monocytes Relative 10 %   Monocytes Absolute 0.7 0.1 - 1.0 K/uL   Eosinophils Relative 5 %   Eosinophils Absolute 0.3 0.0 - 0.5 K/uL   Basophils Relative 1 %   Basophils Absolute 0.1 0.0 - 0.1 K/uL   Immature Granulocytes 0 %   Abs Immature Granulocytes 0.02 0.00 - 0.07 K/uL    Comment: Performed at [facility], [address], [place], [place] [address]  Comprehensive metabolic panel     Status: Abnormal   Collection Time: 08/31/22  5:35 PM  Result Value Ref Range   Sodium 134 (L) 135 - 145 mmol/L   Potassium 3.8 3.5 - 5.1 mmol/L   Chloride 91 (L) 98 - 111 mmol/L   CO2 28 22 - 32 mmol/L   Glucose, Bld 454 (H) 70 - 99 mg/dL    Comment: Glucose reference range applies only to samples taken after fasting for at least 8 hours.   BUN 11 8 - 23 mg/dL   Creatinine, Ser 1.91 0.61 - 1.24 mg/dL   Calcium 9.2 8.9 - 47.8 mg/dL   Total Protein 7.1 6.5 - 8.1 g/dL   Albumin 3.4 (L) 3.5 - 5.0 g/dL   AST  23 15 - 41 U/L   ALT 45 (H) 0 - 44 U/L   Alkaline Phosphatase 80 38 - 126 U/L   Total Bilirubin 0.7 0.3 - 1.2 mg/dL   GFR, Estimated >29 >56 mL/min    Comment: (NOTE) Calculated using the CKD-EPI Creatinine Equation (2021)    Anion gap 15 5 - 15    Comment: Performed at [facility], [address], [place], [place] [address]  Ethanol     Status: None   Collection Time: 08/31/22  5:35 PM  Result Value Ref Range   Alcohol, Ethyl (B) <10 <10 mg/dL    Comment: (NOTE) Lowest detectable limit for serum alcohol is 10 mg/dL.  For medical purposes only. Performed at [facility], [address] [place], [place] [address]   CBG monitoring, ED     Status: Abnormal   Collection Time: 08/31/22  8:13 PM  Result Value Ref Range   Glucose-Capillary 396 (H) 70 - 99 mg/dL    Comment: Glucose reference range applies only to samples taken after fasting for at least 8 hours.   CT HEAD WO CONTRAST  Result Date: 08/31/2022 CLINICAL DATA:  Vision loss. EXAM: CT HEAD WITHOUT CONTRAST TECHNIQUE: Contiguous axial images were obtained from the base of the skull through the vertex without intravenous contrast. RADIATION DOSE REDUCTION: This exam was performed according to the departmental dose-optimization program which includes automated exposure control, adjustment of the mA and/or kV according to patient size and/or use of iterative reconstruction technique. COMPARISON:  None Available. FINDINGS: Brain: Patchy cortical and subcortical hypodensities are present in the superior aspect of the left occipital lobe, favored to reflect an acute to subacute infarct. No intracranial hemorrhage, midline shift, or extra-axial fluid collection is identified. Mild cerebral atrophy  is within normal limits for age. Vascular: Calcified atherosclerosis at the skull base. No hyperdense vessel. Skull: No fracture or suspicious osseous lesion. Sinuses/Orbits: Chronic appearing opacification of a posterior left ethmoid  air cell. Clear mastoid air cells. Unremarkable orbits. Other: None. IMPRESSION: 1. Suspected acute to subacute left occipital lobe infarct. 2. No intracranial hemorrhage. Electronically Signed   By: Sebastian Ache M.D.   On: 08/31/2022 19:05    Pending Labs Unresulted Labs (From admission, onward)     Start     Ordered   09/07/22 0500  Creatinine, serum  (enoxaparin (LOVENOX)    CrCl >/= 30 ml/min)  Weekly,   R     Comments: while on enoxaparin therapy    08/31/22 2006   09/01/22 0500  Lipid panel  (Labs)  Tomorrow morning,   R       Comments: Fasting    08/31/22 2006   08/31/22 2003  CBC  (enoxaparin (LOVENOX)    CrCl >/= 30 ml/min)  Once,   R       Comments: Baseline for enoxaparin therapy IF NOT ALREADY DRAWN.  Notify MD if PLT < 100 K.    08/31/22 2006   08/31/22 2003  Creatinine, serum  (enoxaparin (LOVENOX)    CrCl >/= 30 ml/min)  Once,   R       Comments: Baseline for enoxaparin therapy IF NOT ALREADY DRAWN.    08/31/22 2006            Vitals/Pain Today's Vitals   08/31/22 1718 08/31/22 1724 08/31/22 1900  BP: 108/77  101/79  Pulse: (!) 105  99  Resp: 20  20  Temp: 98.1 F (36.7 C)    TempSrc: Oral    SpO2: 95%  96%  Weight:  163 lb (73.9 kg)   Height:  6\' 1"  (1.854 m)   PainSc:  0-No pain     Isolation Precautions No active isolations  Medications Medications  leflunomide (ARAVA) tablet 20 mg (has no administration in time range)  morphine (MS CONTIN) 12 hr tablet 100 mg (has no administration in time range)  atorvastatin (LIPITOR) tablet 80 mg (has no administration in time range)  hydrochlorothiazide (MICROZIDE) capsule 12.5 mg (has no administration in time range)  predniSONE (DELTASONE) tablet 2 mg (has no administration in time range)  insulin detemir (LEVEMIR) injection 30 Units (has no administration in time range)  insulin aspart (novoLOG) injection 0-24 Units (has no administration in time range)  pregabalin (LYRICA) capsule 150 mg (has no  administration in time range)   stroke: early stages of recovery book (has no administration in time range)  acetaminophen (TYLENOL) tablet 650 mg (has no administration in time range)    Or  acetaminophen (TYLENOL) 160 MG/5ML solution 650 mg (has no administration in time range)    Or  acetaminophen (TYLENOL) suppository 650 mg (has no administration in time range)  enoxaparin (LOVENOX) injection 40 mg (has no administration in time range)    Mobility Walk with assisted device due to AKA R side     Focused Assessments Neuro Assessment Handoff:  Swallow screen pass? Yes    NIH Stroke Scale  Dizziness Present: No Headache Present: No Interval: Shift assessment Level of Consciousness (1a.)   : Alert, keenly responsive LOC Questions (1b. )   : Answers both questions correctly LOC Commands (1c. )   : Performs both tasks correctly Best Gaze (2. )  : Normal Visual (3. )  : Partial hemianopia Facial Palsy (  4. )    : Normal symmetrical movements Motor Arm, Left (5a. )   : No drift Motor Arm, Right (5b. ) : No drift Motor Leg, Left (6a. )  : No drift Motor Leg, Right (6b. ) : Amputation or joint fusion Limb Ataxia (7. ): Absent Sensory (8. )  : Normal, no sensory loss Best Language (9. )  : No aphasia Dysarthria (10. ): Normal Extinction/Inattention (11.)   : No Abnormality Complete NIHSS TOTAL: 1     Neuro Assessment: Within Defined Limits Neuro Checks:   Initial (08/31/22 1841)  Has TPA been given? 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

## 2022-08-31 NOTE — Consult Note (Signed)
NEURO HOSPITALIST CONSULT NOTE   Requesting physician: Dr. Charm Barges  Reason for Consult: Right homonymous hemianopsia  History obtained from:  Patient and Chart     HPI:                                                                                                                                          RENSO Zavala is an 72 y.o. 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 Exam in the ED was more consistent with right homonymous hemianopsia suggesting stroke as the etiology. 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 Neurology was consulted for stroke evaluation.   Home medications include ASA and atorvastatin.   Past Medical History:  Diagnosis Date   Arthritis    "pretty much all over"   Cellulitis of left foot    COPD (chronic obstructive pulmonary disease) (HCC)    Dyspnea    Hyperlipidemia    Hypertension    Nausea and vomiting 08/20/2022   Neuromuscular disorder (HCC)    neuropathy legs   Neuropathy    legs   Osteomyelitis (HCC) 04/2018   4th toe left foot   Osteomyelitis of fourth toe of left foot (HCC) 04/19/2018   Peripheral vascular disease (HCC)    Type II diabetes mellitus (HCC)    Wears glasses    Wears partial dentures    top and bottom partials    Past Surgical History:  Procedure Laterality Date   ABDOMINAL AORTOGRAM W/LOWER EXTREMITY Bilateral 10/19/2018   Procedure: ABDOMINAL AORTOGRAM W/LOWER EXTREMITY;  Surgeon: [name], MD;  Location: [facility];  Service: Cardiovascular;  Laterality: Bilateral;   ABOVE KNEE LEG AMPUTATION Right 11/27/2018   AMPUTATION Left 04/22/2018   Procedure: LEFT FOOT 4TH RAY AMPUTATION;  Surgeon: [name], MD;  Location: [facility] OR;  Service:  Orthopedics;  Laterality: Left;   AMPUTATION Right 11/27/2018   Procedure: RIGHT ABOVE KNEE AMPUTATION;  Surgeon: [name], MD;  Location: [facility] OR;  Service: Orthopedics;  Laterality: Right;   BACK SURGERY     CHOLECYSTECTOMY N/A 10/26/2013   Procedure: LAPAROSCOPIC CHOLECYSTECTOMY WITH INTRAOPERATIVE CHOLANGIOGRAM;  Surgeon: Clovis Pu. Cornett, MD;  Location: [facility] OR;  Service: General;  Laterality: N/A;   COLONOSCOPY     CYST EXCISION Bilateral 02/15/2015   Procedure: LEFT INDEX FINGER AND RIGHT MIDDLE FINGER NODULE EXCISION;  Surgeon: Tarry Kos, MD;  Location: [facility];  Service: Orthopedics;  Laterality: Bilateral;   CYST EXCISION PERINEAL N/A 12/08/2012   Procedure:  CYST EXCISION PERINeum;  Surgeon: Clovis Pu. Cornett, MD;  Location: [facility];  Service: General;  Laterality: N/A;   FOOT AMPUTATION Right 2005   I & D EXTREMITY Right 03/07/2017   Procedure: EXCISION FIBULAR HEAD RIGHT BELOW KNEE AMPUTATION;  Surgeon: [name], MD;  Location: [facility];  Service: Orthopedics;  Laterality: Right;   INCISE AND DRAIN ABCESS  12/02/2014   PERINEAL ABSCESS   INCISION AND DRAINAGE PERIRECTAL ABSCESS Left 12/02/2014   Procedure: IRRIGATION AND DEBRIDEMENT PERINEAL ABSCESS;  Surgeon: Abigail Miyamoto, MD;  Location: [facility] OR;  Service: General;  Laterality: Left;   LEG AMPUTATION BELOW KNEE Right 2005   LOWER EXTREMITY ANGIOGRAPHY N/A 05/05/2017   Procedure: LOWER EXTREMITY ANGIOGRAPHY;  Surgeon: [name], MD;  Location: [facility];  Service: Cardiovascular;  Laterality: N/A;   LUMBAR DISC SURGERY  82,90   ruptured disc   PERIPHERAL VASCULAR INTERVENTION Right 05/05/2017   Procedure: PERIPHERAL VASCULAR INTERVENTION;  Surgeon: [name], MD;  Location: [facility];  Service: Cardiovascular;  Laterality: Right;   PERIPHERAL VASCULAR INTERVENTION Right 10/19/2018   Procedure: PERIPHERAL VASCULAR INTERVENTION;  Surgeon: [name], MD;   [facility]: [facility];  Service: Cardiovascular;  Laterality: Right;   SCAR REVISION Right 2005   @ amputation   STUMP REVISION Right 08/21/2018   Procedure: REVISION RIGHT BELOW KNEE AMPUTATION, EXCISION FIBULA;  Surgeon: [name], MD;  Location: [facility];  Service: Orthopedics;  Laterality: Right;   STUMP REVISION Right 09/09/2018   Procedure: REVISION RIGHT BELOW KNEE AMPUTATION;  Surgeon: [name], MD;  Location: [facility] OR;  Service: Orthopedics;  Laterality: Right;   TOE AMPUTATION Right 2005    Family History  Problem Relation Age of Onset   Cirrhosis Father              Social History:  reports that he has been smoking cigarettes. He started smoking about 57 years ago. He has a 47.00 pack-year smoking history. He has never used smokeless tobacco. He reports that he does not drink alcohol and does not use drugs.  Allergies  Allergen Reactions   Varenicline Nausea Only    MEDICATIONS:                                                                                                                     Prior to Admission:  Medications Prior to Admission  Medication Sig Dispense Refill Last Dose   acetaminophen (TYLENOL) 500 MG tablet Take 1,000 mg by mouth every 6 (six) hours as needed for moderate pain.   Past Month   aspirin 81 MG chewable tablet Chew 81 mg by mouth daily.   08/31/2022   atorvastatin (LIPITOR) 80 MG tablet Take 1 tablet (80 mg total) by mouth daily at 6 PM. 30 tablet 2 08/31/2022   calcium carbonate (OSCAL) 1500 (600 Ca) MG TABS tablet Take 600 mg of elemental calcium by mouth daily with breakfast.  08/31/2022   cholecalciferol (VITAMIN D) 1000 UNITS tablet Take 1,000 Units by mouth daily.   08/31/2022   Emollient (EUCERIN ADVANCED REPAIR) CREA Apply 1 application  topically 2 (two) times daily.   08/31/2022   etanercept (ENBREL) 50 MG/ML injection Inject 50 mg into the skin once a week.   08/24/2022   fluticasone (FLONASE) 50 MCG/ACT nasal spray Place 2  sprays into both nostrils daily as needed for allergies or rhinitis.   Past Week   furosemide (LASIX) 20 MG tablet Take 20 mg by mouth daily as needed for fluid or edema.   Past Month   hydrochlorothiazide (MICROZIDE) 12.5 MG capsule Take 12.5 mg by mouth daily.   08/31/2022   insulin glargine (SEMGLEE) 100 UNIT/ML injection Inject 75 Units into the skin daily.   08/31/2022   leflunomide (ARAVA) 20 MG tablet Take 20 mg by mouth daily.   08/31/2022   metoprolol succinate (TOPROL-XL) 25 MG 24 hr tablet Take 25 mg by mouth at bedtime.   08/31/2022 at 4 pm   morphine (MS CONTIN) 100 MG 12 hr tablet Take 100 mg by mouth every 12 (twelve) hours.   08/31/2022   nicotine (NICODERM CQ - DOSED IN MG/24 HOURS) 21 mg/24hr patch Place 21 mg onto the skin daily.   Past Week   ondansetron (ZOFRAN-ODT) 4 MG disintegrating tablet Take 1 tablet (4 mg total) by mouth every 8 (eight) hours as needed for nausea or vomiting. 20 tablet 0 08/10/2022   predniSONE (DELTASONE) 1 MG tablet Take 2 mg by mouth daily with breakfast.   08/31/2022   pregabalin (LYRICA) 150 MG capsule Take 150 mg by mouth 2 (two) times daily.   08/31/2022   promethazine (PHENERGAN) 25 MG tablet Take 25 mg by mouth every 8 (eight) hours as needed for nausea or vomiting.   08/17/2022   promethazine (PHENERGAN) 25 MG/ML injection Inject 0.5 mLs (12.5 mg total) into the vein once as needed for nausea or vomiting. If not responding to oral promethazine. 1 mL 0 08/17/2022   Semaglutide,0.25 or 0.5MG /DOS, (OZEMPIC, 0.25 OR 0.5 MG/DOSE,) 2 MG/1.5ML SOPN Inject 0.5 mg into the skin once a week.   08/31/2022   Sodium Fluoride (PREVIDENT 5000 BOOSTER PLUS DT) Place 1 application  onto teeth every evening.   08/31/2022   vitamin B-12 (CYANOCOBALAMIN) 100 MCG tablet Take 100 mcg by mouth daily.   08/31/2022   cefdinir (OMNICEF) 300 MG capsule Take 1 capsule (300 mg total) by mouth 2 (two) times daily. (Patient not taking: Reported on 08/31/2022) 14 capsule 0 Not Taking    Scheduled:   stroke: early stages of recovery book   Does not apply Once   aspirin EC  81 mg Oral Daily   atorvastatin  80 mg Oral q1800   clopidogrel  75 mg Oral Daily   enoxaparin (LOVENOX) injection  40 mg Subcutaneous Q24H   hydrochlorothiazide  12.5 mg Oral Daily   insulin aspart  0-24 Units Subcutaneous TID WC   insulin detemir  30 Units Subcutaneous BID   leflunomide  20 mg Oral Daily   morphine  100 mg Oral Q12H   nicotine  21 mg Transdermal Daily   predniSONE  2 mg Oral Q breakfast   pregabalin  150 mg Oral BID   Continuous:   ROS:  Denies any facial droop, dysphasia, dysarthria, limb numbness, limb weakness or ataxia. Other ROS as per HPI.    Blood pressure 101/79, pulse 99, temperature 98.1 F (36.7 C), temperature source Oral, resp. rate 20, height 6\' 1"  (1.854 m), weight 73.9 kg, SpO2 96 %.   General Examination:                                                                                                       Physical Exam  HEENT-  /AT   Lungs- Respirations unlabored Extremities- Right AKA. Chronic pigmentary and trophic skin changes to distal LLE below knee with 4th toe amputation noted.   Neurological Examination Mental Status: Awake and alert. Fully oriented. Speech fluent without evidence of aphasia.  Able to follow all commands without difficulty.  Cranial Nerves: II: PERRL. Right upper quadrant of both eyes with crescentic visual field defect. Entire right lower quadrant of both eyes with vision loss (right inferior quadrantanopsia).  III,IV, VI: No ptosis. EOMI.  V: Temp sensation equal bilaterally  VII: Smile symmetric VIII: Hearing intact to voice IX,X: No hoarseness XI: Symmetric shoulder shrug XII: Midline tongue extension Motor: BUE: 5/5 proximally and distally RLE: 5/5 right hip flexion LLE 5/5 hip  flexion, knee extension and ankle plantar and dorsiflexion.  No pronator drift.  Sensory: Temp and light touch intact to BUE. Severely impaired FT and temp sensation below left knee. Proximal sensation of BLE above knees is normal. No extinction to DSS.  Deep Tendon Reflexes: 1+ bilateral brachioradialis and left patellar.  Cerebellar: No ataxia with FNF bilaterally  Gait: Deferred   Lab Results: Basic Metabolic Panel: Recent Labs  Lab 08/31/22 1735  NA 134*  K 3.8  CL 91*  CO2 28  GLUCOSE 454*  BUN 11  CREATININE 0.97  CALCIUM 9.2    CBC: Recent Labs  Lab 08/31/22 1735  WBC 7.0  NEUTROABS 4.7  HGB 14.8  HCT 44.1  MCV 88.9  PLT 242    Cardiac Enzymes: No results for input(s): "CKTOTAL", "CKMB", "CKMBINDEX", "TROPONINI" in the last 168 hours.  Lipid Panel: No results for input(s): "CHOL", "TRIG", "HDL", "CHOLHDL", "VLDL", "LDLCALC" in the last 168 hours.  Imaging: CT HEAD WO CONTRAST  Result Date: 08/31/2022 CLINICAL DATA:  Vision loss. EXAM: CT HEAD WITHOUT CONTRAST TECHNIQUE: Contiguous axial images were obtained from the base of the skull through the vertex without intravenous contrast. RADIATION DOSE REDUCTION: This exam was performed according to the departmental dose-optimization program which includes automated exposure control, adjustment of the mA and/or kV according to patient size and/or use of iterative reconstruction technique. COMPARISON:  None Available. FINDINGS: Brain: Patchy cortical and subcortical hypodensities are present in the superior aspect of the left occipital lobe, favored to reflect an acute to subacute infarct. No intracranial hemorrhage, midline shift, or extra-axial fluid collection is identified. Mild cerebral atrophy is within normal limits for age. Vascular: Calcified atherosclerosis at the skull base. No hyperdense vessel. Skull: No fracture or suspicious osseous lesion. Sinuses/Orbits: Chronic appearing opacification of a posterior left  ethmoid air cell. Clear mastoid  air cells. Unremarkable orbits. Other: None. IMPRESSION: 1. Suspected acute to subacute left occipital lobe infarct. 2. No intracranial hemorrhage. Electronically Signed   By: Sebastian Ache M.D.   On: 08/31/2022 19:05     Assessment: 72 year old male presenting with right homonymous hemianopsia - Exam reveals right inferior homonymous quadrantanopsia and a right superior homonymous crescentic visual field defect.  - CT head: Suspected acute to subacute left occipital lobe infarct. No intracranial hemorrhage - MRI brain:  Patchy multifocal acute ischemic infarcts involving the left cerebral hemisphere, with greatest involvement at the left occipital lobe. Mild associated petechial hemorrhage without frank hemorrhagic transformation or significant mass effect. Underlying mild chronic microvascular ischemic disease.  - EKG 08/22/22: Sinus tachycardia; Left axis deviation; Left ventricular hypertrophy with repolarization abnormality (R in aVL, [employer] product) - Labs:  - Glucose elevated at 464.  - PT, INR and PTT normal.  - EtOH < 10.  - Lipid panel except for low HDL - CBC normal - HgbA1c on 08/21/22 was elevated at 7.0 - Stroke risk factors: HLD, HTN, PVD, DM2 and history of smoking.   Recommendations: - CTA of head and neck - TTE - Agree with adding Plavix to ASA. Continue DAPT indefinietly.  - Continue atorvastatin.  - Cardiac telemetry - PT consult, OT consult, Speech consult - BP management per standard protocol. Out of the permissive HTN time window.  - The patient has been advised not to drive due to the severity of his visual field cut  - Risk factor modification - Frequent neuro checks - NPO until passes stroke swallow screen - Glycemic control   Electronically signed: Dr. Caryl Pina 08/31/2022, 7:20 PM

## 2022-08-31 NOTE — ED Notes (Signed)
Spoke to main pharmacy and to Pt.  Pt states he's not due his morphine until tomorrow.  Pharmacy is holding this medication.  Floor team can decide if it is needed to be given.  If so, they can contact main pharmacy.

## 2022-09-01 ENCOUNTER — Inpatient Hospital Stay (HOSPITAL_COMMUNITY): Payer: Medicare (Managed Care)

## 2022-09-01 DIAGNOSIS — I63032 Cerebral infarction due to thrombosis of left carotid artery: Secondary | ICD-10-CM

## 2022-09-01 DIAGNOSIS — I6522 Occlusion and stenosis of left carotid artery: Secondary | ICD-10-CM

## 2022-09-01 DIAGNOSIS — I5030 Unspecified diastolic (congestive) heart failure: Secondary | ICD-10-CM

## 2022-09-01 DIAGNOSIS — I6340 Cerebral infarction due to embolism of unspecified cerebral artery: Secondary | ICD-10-CM | POA: Diagnosis not present

## 2022-09-01 LAB — LIPID PANEL
Cholesterol: 72 mg/dL (ref 0–200)
HDL: 24 mg/dL — ABNORMAL LOW (ref >40)
LDL Cholesterol: 29 mg/dL (ref 0–99)
Total CHOL/HDL Ratio: 3 RATIO
Triglycerides: 96 mg/dL (ref <150)
VLDL: 19 mg/dL (ref 0–40)

## 2022-09-01 LAB — ECHOCARDIOGRAM COMPLETE
Area-P 1/2: 3.68 cm2
Calc EF: 44.1 %
Height: 73 in
S' Lateral: 2.9 cm
Single Plane A2C EF: 29.5 %
Single Plane A4C EF: 51 %
Weight: 2608 oz

## 2022-09-01 LAB — GLUCOSE, CAPILLARY
Glucose-Capillary: 73 mg/dL (ref 70–99)
Glucose-Capillary: 159 mg/dL — ABNORMAL HIGH (ref 70–99)
Glucose-Capillary: 151 mg/dL — ABNORMAL HIGH (ref 70–99)
Glucose-Capillary: 199 mg/dL — ABNORMAL HIGH (ref 70–99)

## 2022-09-01 MED ORDER — VITAMIN B-12 100 MCG PO TABS
100.0000 ug | ORAL_TABLET | Freq: Every day | ORAL | Status: DC
  Administered 2022-09-01: 100 ug via ORAL
  Filled 2022-09-01 (×3): qty 1

## 2022-09-01 MED ORDER — IOHEXOL 350 MG/ML SOLN
75.0000 mL | Freq: Once | INTRAVENOUS | Status: AC | PRN
  Administered 2022-09-01: 75 mL via INTRAVENOUS

## 2022-09-01 MED ORDER — NICOTINE 21 MG/24HR TD PT24
21.0000 mg | MEDICATED_PATCH | Freq: Once | TRANSDERMAL | Status: DC
  Administered 2022-09-01: 21 mg via TRANSDERMAL
  Filled 2022-09-01: qty 1

## 2022-09-01 NOTE — H&P (View-Only) (Signed)
[facility] [place]  Patient name: Brian Zavala MRN: [id number] DOB: [birth year] Sex: male   REQUESTING PROVIDER:   Hospitalist   REASON FOR CONSULT:    Symptomatic left carotid stenosis  HISTORY OF PRESENT ILLNESS:   Brian Zavala is a 72 y.o. male, who presented to the hospital with right eye vision loss beginning Thursday afternoon.  His symptoms have nearly returned.  He denies any other issues with numbness or weakness, or slurred speech.  The patient has a history of COPD, alcohol abuse and type 2 diabetes complicated by neuropathy.  He has had a right leg amputation by Dr. Lajoyce Corners.  He denies any chest pain.  He sees Dr. Allyson Sabal for his coronary artery disease.  He continues to smoke.  He does not ambulate but gets around with a wheelchair and can do transfers.  PAST MEDICAL HISTORY    Past Medical History:  Diagnosis Date   Arthritis    "pretty much all over"   Cellulitis of left foot    COPD (chronic obstructive pulmonary disease) (HCC)    Dyspnea    Hyperlipidemia    Hypertension    Nausea and vomiting 08/20/2022   Neuromuscular disorder (HCC)    neuropathy legs   Neuropathy    legs   Osteomyelitis (HCC) 04/2018   4th toe left foot   Osteomyelitis of fourth toe of left foot (HCC) 04/19/2018   Peripheral vascular disease (HCC)    Type II diabetes mellitus (HCC)    Wears glasses    Wears partial dentures    top and bottom partials     FAMILY HISTORY   Family History  Problem Relation Age of Onset   Cirrhosis Father     SOCIAL HISTORY:   Social History   Socioeconomic History   Marital status: Divorced    Spouse name: Not on file   Number of children: Not on file   Years of education: Not on file   Highest education level: Not on file  Occupational History   Not on file  Tobacco Use   Smoking status: Every Day    Packs/day: 1.00    Years: 47.00    Additional pack years: 0.00     Total pack years: 47.00    Types: Cigarettes    Start date: 03/04/1965   Smokeless tobacco: Never  Vaping Use   Vaping Use: Never used  Substance and Sexual Activity   Alcohol use: No   Drug use: No   Sexual activity: Not Currently  Other Topics Concern   Not on file  Social History Narrative   Not on file   Social Determinants of Health   Financial Resource Strain: Not on file  Food Insecurity: No Food Insecurity (08/20/2022)   Hunger Vital Sign    Worried About Running Out of Food in the Last Year: Never true    Ran Out of Food in the Last Year: Never true  Transportation Needs: No Transportation Needs (08/20/2022)   PRAPARE - Administrator, Civil Service (Medical): No    Lack of Transportation (Non-Medical): No  Physical Activity: Not on file  Stress: Not on file  Social Connections: Not on file  Intimate Partner Violence: Not At Risk (08/20/2022)   Humiliation, Afraid, Rape, and Kick questionnaire    Fear of Current or Ex-Partner: No    Emotionally Abused: No    Physically Abused: No    Sexually Abused: No    ALLERGIES:  Allergies  Allergen Reactions   Varenicline Nausea Only    CURRENT MEDICATIONS:    Current Facility-Administered Medications  Medication Dose Route Frequency Provider Last Rate Last Admin   acetaminophen (TYLENOL) tablet 650 mg  650 mg Oral Q4H PRN Alan Mulder, MD       Or   acetaminophen (TYLENOL) 160 MG/5ML solution 650 mg  650 mg Per Tube Q4H PRN Alan Mulder, MD       Or   acetaminophen (TYLENOL) suppository 650 mg  650 mg Rectal Q4H PRN Alan Mulder, MD       aspirin EC tablet 81 mg  81 mg Oral Daily Alan Mulder, MD   81 mg at 09/01/22 4782   atorvastatin (LIPITOR) tablet 80 mg  80 mg Oral q1800 Alan Mulder, MD   80 mg at 09/01/22 1632   clopidogrel (PLAVIX) tablet 75 mg  75 mg Oral Daily Alan Mulder, MD   75 mg at 09/01/22 9562   enoxaparin (LOVENOX) injection 40 mg  40 mg Subcutaneous Q24H Alan Mulder, MD   40 mg at 08/31/22 2114   insulin aspart (novoLOG) injection 0-24 Units  0-24 Units Subcutaneous TID WC Alan Mulder, MD   2 Units at 09/01/22 1631   insulin detemir (LEVEMIR) injection 30 Units  30 Units Subcutaneous BID Alan Mulder, MD   30 Units at 08/31/22 2115   leflunomide (ARAVA) tablet 20 mg  20 mg Oral Daily Alan Mulder, MD   20 mg at 09/01/22 1308   morphine (MS CONTIN) 12 hr tablet 100 mg  100 mg Oral Q12H Alan Mulder, MD   100 mg at 09/01/22 6578   nicotine (NICODERM CQ - dosed in mg/24 hours) patch 21 mg  21 mg Transdermal Daily Alan Mulder, MD   21 mg at 09/01/22 0941   predniSONE (DELTASONE) tablet 2 mg  2 mg Oral Q breakfast Alan Mulder, MD   2 mg at 09/01/22 1137   pregabalin (LYRICA) capsule 150 mg  150 mg Oral BID Alan Mulder, MD   150 mg at 09/01/22 4696   vitamin B-12 (CYANOCOBALAMIN) tablet 100 mcg  100 mcg Oral Daily Leroy Sea, MD   100 mcg at 09/01/22 1137    REVIEW OF SYSTEMS:   [X]  denotes positive finding, [ ]  denotes negative finding Cardiac  Comments:  Chest pain or chest pressure:    Shortness of breath upon exertion:    Short of breath when lying flat:    Irregular heart rhythm:        Vascular    Pain in calf, thigh, or hip brought on by ambulation:    Pain in feet at night that wakes you up from your sleep:     Blood clot in your veins:    Leg swelling:         Pulmonary    Oxygen at home:    Productive cough:     Wheezing:         Neurologic    Sudden weakness in arms or legs:     Sudden numbness in arms or legs:     Sudden onset of difficulty speaking or slurred speech:    Temporary loss of vision in one eye:  x   Problems with dizziness:         Gastrointestinal    Blood in stool:      Vomited blood:         Genitourinary    Burning when urinating:  Blood in urine:        Psychiatric    Major depression:         Hematologic    Bleeding problems:    Problems with blood clotting too  easily:        Skin    Rashes or ulcers:        Constitutional    Fever or chills:     PHYSICAL EXAM:   Vitals:   09/01/22 0759 09/01/22 1216 09/01/22 1536 09/01/22 1700  BP: (!) 137/94 132/81 95/62   Pulse: 87 (!) 105 99   Resp: (!) 21 15 20    Temp: 98.1 F (36.7 C) 97.7 F (36.5 C) 97.9 F (36.6 C)   TempSrc: Oral Oral Oral   SpO2:    92%  Weight:      Height:        GENERAL: The patient is a well-nourished male, in no acute distress. The vital signs are documented above. CARDIAC: There is a regular rate and rhythm.  PULMONARY: Nonlabored respirations ABDOMEN: Soft and non-tender with normal pitched bowel sounds.  MUSCULOSKELETAL: Right leg amputation NEUROLOGIC: No focal weakness or paresthesias are detected. SKIN: There are no ulcers or rashes noted. PSYCHIATRIC: The patient has a normal affect.  STUDIES:   I have reviewed the following: CT angiogram neck: 1. Negative for large vessel occlusion but Positive for Left PCA P3 or P4 branch occlusion. Left MCA is within normal limits.   2. Positive also for Severe atherosclerosis in the neck and at the thoracic inlet. Aortic Atherosclerosis (ICD10-I70.0), and high-grade stenoses: - 70% estimated stenosis at both ICA bulbs, both approaching a RADIOGRAPHIC STRING SIGN. Recommend follow-up with Vascular Surgery. 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 - Moderate to Severe stenosis of the Dominant Right Vertebral Artery origin.   3. Mild progression of Left hemisphere cytotoxic edema since the CT yesterday. No malignant hemorrhagic transformation or intracranial mass effect.  MRI brain 1. Patchy multifocal acute ischemic infarcts involving the left cerebral hemisphere, with greatest involvement at the left occipital lobe. Mild associated petechial hemorrhage without frank hemorrhagic transformation or significant mass effect. 2. Underlying mild chronic microvascular ischemic  disease.   ASSESSMENT and PLAN   Symptomatic left carotid stenosis: Fortunately patient's vision loss is close to baseline.  Workup has revealed a high-grade bilateral carotid stenosis.  The left side is likely the etiology for his stroke.  We talked about endarterectomy versus TCAR.  I am going to review his images with [facility] and confirmed that we can proceed with TCAR.  I think that his lesion is high and that carotid stenting would be favorable to minimize the risk of nerve injury.  I discussed the risk of stroke with the procedure as well as bleeding.  He needs to be diligent about taking his aspirin, statin, and Plavix.  He would like to go home for the procedure.  From my perspective that would be fine.  If he does my office will contact him and tell him when to return for his procedure on Wednesday   Durene Cal, IV, MD, FACS [facility] [phone] Pager [phone]

## 2022-09-01 NOTE — Consult Note (Signed)
 [facility]  Patient name: Brian Zavala MRN: [id number] DOB: 05/17/1950 Sex: male   REQUESTING PROVIDER:   Hospitalist   REASON FOR CONSULT:    Symptomatic left carotid stenosis  HISTORY OF PRESENT ILLNESS:   Brian Zavala is a 72 y.o. male, who presented to the hospital with right eye vision loss beginning Thursday afternoon.  His symptoms have nearly returned.  He denies any other issues with numbness or weakness, or slurred speech.  The patient has a history of COPD, alcohol abuse and type 2 diabetes complicated by neuropathy.  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  He denies any chest pain.  He sees [name] for his coronary artery disease.  He continues to smoke.  He does not ambulate but gets around with a wheelchair and can do transfers.  PAST MEDICAL HISTORY    Past Medical History:  Diagnosis Date   Arthritis    "pretty much all over"   Cellulitis of left foot    COPD (chronic obstructive pulmonary disease) (HCC)    Dyspnea    Hyperlipidemia    Hypertension    Nausea and vomiting 08/20/2022   Neuromuscular disorder (HCC)    neuropathy legs   Neuropathy    legs   Osteomyelitis (HCC) 04/2018   4th toe left foot   Osteomyelitis of fourth toe of left foot (HCC) 04/19/2018   Peripheral vascular disease (HCC)    Type II diabetes mellitus (HCC)    Wears glasses    Wears partial dentures    top and bottom partials     FAMILY HISTORY   Family History  Problem Relation Age of Onset   Cirrhosis Father     SOCIAL HISTORY:   Social History   Socioeconomic History   Marital status: Divorced    Spouse name: Not on file   Number of children: Not on file   Years of education: Not on file   Highest education level: Not on file  Occupational History   Not on file  Tobacco Use   Smoking status: Every Day    Packs/day: 1.00    Years: 47.00    Additional pack years: 0.00     Total pack years: 47.00    Types: Cigarettes    Start date: 03/04/1965   Smokeless tobacco: Never  Vaping Use   Vaping Use: Never used  Substance and Sexual Activity   Alcohol use: No   Drug use: No   Sexual activity: Not Currently  Other Topics Concern   Not on file  Social History Narrative   Not on file   Social Determinants of Health   Financial Resource Strain: Not on file  Food Insecurity: No Food Insecurity (08/20/2022)   Hunger Vital Sign    Worried About Running Out of Food in the Last Year: Never true    Ran Out of Food in the Last Year: Never true  Transportation Needs: No Transportation Needs (08/20/2022)   PRAPARE - Transportation    Lack of Transportation (Medical): No    Lack of Transportation (Non-Medical): No  Physical Activity: Not on file  Stress: Not on file  Social Connections: Not on file  Intimate Partner Violence: Not At Risk (08/20/2022)   Humiliation, Afraid, Rape, and Kick questionnaire    Fear of Current or Ex-Partner: No    Emotionally Abused: No    Physically Abused: No    Sexually Abused: No    ALLERGIES:      Allergies  Allergen Reactions   Varenicline Nausea Only    CURRENT MEDICATIONS:    Current Facility-Administered Medications  Medication Dose Route Frequency Provider Last Rate Last Admin   acetaminophen (TYLENOL) tablet 650 mg  650 mg Oral Q4H PRN Dorrell, Robert, MD       Or   acetaminophen (TYLENOL) 160 MG/5ML solution 650 mg  650 mg Per Tube Q4H PRN Dorrell, Robert, MD       Or   acetaminophen (TYLENOL) suppository 650 mg  650 mg Rectal Q4H PRN Dorrell, Robert, MD       aspirin EC tablet 81 mg  81 mg Oral Daily Dorrell, Robert, MD   81 mg at 09/01/22 0938   atorvastatin (LIPITOR) tablet 80 mg  80 mg Oral q1800 Dorrell, Robert, MD   80 mg at 09/01/22 1632   clopidogrel (PLAVIX) tablet 75 mg  75 mg Oral Daily Dorrell, Robert, MD   75 mg at 09/01/22 0938   enoxaparin (LOVENOX) injection 40 mg  40 mg Subcutaneous Q24H Dorrell,  Robert, MD   40 mg at 08/31/22 2114   insulin aspart (novoLOG) injection 0-24 Units  0-24 Units Subcutaneous TID WC Dorrell, Robert, MD   2 Units at 09/01/22 1631   insulin detemir (LEVEMIR) injection 30 Units  30 Units Subcutaneous BID Dorrell, Robert, MD   30 Units at 08/31/22 2115   leflunomide (ARAVA) tablet 20 mg  20 mg Oral Daily Dorrell, Robert, MD   20 mg at 09/01/22 0939   morphine (MS CONTIN) 12 hr tablet 100 mg  100 mg Oral Q12H Dorrell, Robert, MD   100 mg at 09/01/22 0938   nicotine (NICODERM CQ - dosed in mg/24 hours) patch 21 mg  21 mg Transdermal Daily Dorrell, Robert, MD   21 mg at 09/01/22 0941   predniSONE (DELTASONE) tablet 2 mg  2 mg Oral Q breakfast Dorrell, Robert, MD   2 mg at 09/01/22 1137   pregabalin (LYRICA) capsule 150 mg  150 mg Oral BID Dorrell, Robert, MD   150 mg at 09/01/22 0939   vitamin B-12 (CYANOCOBALAMIN) tablet 100 mcg  100 mcg Oral Daily Singh, Prashant K, MD   100 mcg at 09/01/22 1137    REVIEW OF SYSTEMS:   [X] denotes positive finding, [ ] denotes negative finding Cardiac  Comments:  Chest pain or chest pressure:    Shortness of breath upon exertion:    Short of breath when lying flat:    Irregular heart rhythm:        Vascular    Pain in calf, thigh, or hip brought on by ambulation:    Pain in feet at night that wakes you up from your sleep:     Blood clot in your veins:    Leg swelling:         Pulmonary    Oxygen at home:    Productive cough:     Wheezing:         Neurologic    Sudden weakness in arms or legs:     Sudden numbness in arms or legs:     Sudden onset of difficulty speaking or slurred speech:    Temporary loss of vision in one eye:  x   Problems with dizziness:         Gastrointestinal    Blood in stool:      Vomited blood:         Genitourinary    Burning when urinating:       Blood in urine:        Psychiatric    Major depression:         Hematologic    Bleeding problems:    Problems with blood clotting too  easily:        Skin    Rashes or ulcers:        Constitutional    Fever or chills:     PHYSICAL EXAM:   Vitals:   09/01/22 0759 09/01/22 1216 09/01/22 1536 09/01/22 1700  BP: (!) 137/94 132/81 95/62   Pulse: 87 (!) 105 99   Resp: (!) 21 15 20   Temp: 98.1 F (36.7 C) 97.7 F (36.5 C) 97.9 F (36.6 C)   TempSrc: Oral Oral Oral   SpO2:    92%  Weight:      Height:        GENERAL: The patient is a well-nourished male, in no acute distress. The vital signs are documented above. CARDIAC: There is a regular rate and rhythm.  PULMONARY: Nonlabored respirations ABDOMEN: Soft and non-tender with normal pitched bowel sounds.  MUSCULOSKELETAL: Right leg amputation NEUROLOGIC: No focal weakness or paresthesias are detected. SKIN: There are no ulcers or rashes noted. PSYCHIATRIC: The patient has a normal affect.  STUDIES:   I have reviewed the following: CT angiogram neck: 1. Negative for large vessel occlusion but Positive for Left PCA P3 or P4 branch occlusion. Left MCA is within normal limits.   2. Positive also for Severe atherosclerosis in the neck and at the thoracic inlet. Aortic Atherosclerosis (ICD10-I70.0), and high-grade stenoses: - 70% estimated stenosis at both ICA bulbs, both approaching a RADIOGRAPHIC STRING SIGN. Recommend follow-up with Vascular Surgery. 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 - Moderate to Severe stenosis of the Dominant Right Vertebral Artery origin.   3. Mild progression of Left hemisphere cytotoxic edema since the CT yesterday. No malignant hemorrhagic transformation or intracranial mass effect.  MRI brain 1. Patchy multifocal acute ischemic infarcts involving the left cerebral hemisphere, with greatest involvement at the left occipital lobe. Mild associated petechial hemorrhage without frank hemorrhagic transformation or significant mass effect. 2. Underlying mild chronic microvascular ischemic  disease.   ASSESSMENT and PLAN   Symptomatic left carotid stenosis: Fortunately patient's vision loss is close to baseline.  Workup has revealed a high-grade bilateral carotid stenosis.  The left side is likely the etiology for his stroke.  We talked about endarterectomy versus TCAR.  I am going to review his images with [facility] and confirmed that we can proceed with TCAR.  I think that his lesion is high and that carotid stenting would be favorable to minimize the risk of nerve injury.  I discussed the risk of stroke with the procedure as well as bleeding.  He needs to be diligent about taking his aspirin, statin, and Plavix.  He would like to go home for the procedure.  From my perspective that would be fine.  If he does my office will contact him and tell him when to return for his procedure on Wednesday   Wells Fredis Malkiewicz, IV, MD, FACS [facility] Tel [phone] Pager [phone]  

## 2022-09-01 NOTE — Progress Notes (Addendum)
PROGRESS NOTE                                                                                                                                                                                                             Patient Demographics:    Brian Zavala, is a 72 y.o. male, DOB - Aug 10, 1950, ZOX:[id number]  Outpatient Primary MD for the patient is Kermit Balo, DO    LOS - 1  Admit date - 08/31/2022    Chief Complaint  Patient presents with   Loss of Vision       Brief Narrative (HPI from H&P)   72 y.o. male with medical history significant of COPD, alcohol usage, neuropathy, type 2 diabetes who presents emergency due to vision loss.  Patient lost vision in his right eye for the last 2 days, workup consistent with CVA.   Subjective:    Brian Zavala today has, No headache, No chest pain, No abdominal pain - No Nausea, No new weakness tingling or numbness, no SOB, vision improving   Assessment  & Plan :    Right eye visual loss due to left occipital and cerebral ischemic infarcts, CTA confirms left PCA P3 or poor P4 branch occlusion, severe atherosclerosis in the neck and at thoracic inlet, high-grade stenosis 70% at both ICA bulbs. He has been started on DAPT along with high intensity statin, of note he was taking aspirin and high intensity statin prior to admission, LDL stable, recent diagnosis of DM type II, A1c noted currently on insulin regimen which will be continued, full stroke protocol, echocardiogram pending, await stroke team input, vascular surgery also consulted.    Hypertension.  Permissive hypertension for now.  Dyslipidemia.  Continue high intensity statin at home dose, LDL stable.  Smoking.  Counseled to quit.  NicoDerm patch.  PAD - R BKA, L foot 4 th ray amputation.  History of B12 deficiency.  Continue oral replacement.  Chronic pain.  On MS Contin chronically which will be continued.     History of chronic arthritis.  Claims he is on chronic prednisone for it.  Continue.    DM type II.  A1c noted.  Continue present dose insulin.  Lab Results  Component Value Date   HGBA1C 7.0 (H) 08/21/2022   CBG (last 3)  Recent Labs    08/31/22 2013  08/31/22 2234 09/01/22 0758  GLUCAP 396* 239* 73        Condition - Extremely Guarded  Family Communication  :  mother bedside 09/01/22  Code Status :  Full  Consults  :  Neuro, vascular surgery Dr. Myra Gianotti  PUD Prophylaxis :     Procedures  :     TTE -   MRI -  1. Patchy multifocal acute ischemic infarcts involving the left cerebral hemisphere, with greatest involvement at the left occipital lobe. Mild associated petechial hemorrhage without frank hemorrhagic transformation or significant mass effect. 2. Underlying mild chronic microvascular ischemic disease.  CTA -  1. Negative for large vessel occlusion but Positive for Left PCA P3 or P4 branch occlusion. Left MCA is within normal limits. 2. Positive also for Severe atherosclerosis in the neck and at the thoracic inlet. Aortic Atherosclerosis (ICD10-I70.0), and high-grade stenoses: - 70% estimated stenosis at both ICA bulbs, both approaching a RADIOGRAPHIC STRING SIGN. Recommend follow-up with Vascular Surgery. 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 - Moderate to Severe stenosis of the Dominant Right Vertebral Artery origin. 3. Mild progression of Left hemisphere cytotoxic edema since the CT yesterday. No malignant hemorrhagic transformation or intracranial mass effect.      Disposition Plan  :    Status is: Inpatient  DVT Prophylaxis  :    enoxaparin (LOVENOX) injection 40 mg Start: 08/31/22 2200 SCD's Start: 08/31/22 2003    Lab Results  Component Value Date   PLT 243 08/31/2022    Diet :  Diet Order             DIET DYS 3 Room service appropriate? Yes; Fluid consistency: Thin  Diet effective now                     Inpatient Medications  Scheduled Meds:   stroke: early stages of recovery book   Does not apply Once   aspirin EC  81 mg Oral Daily   atorvastatin  80 mg Oral q1800   clopidogrel  75 mg Oral Daily   enoxaparin (LOVENOX) injection  40 mg Subcutaneous Q24H   hydrochlorothiazide  12.5 mg Oral Daily   insulin aspart  0-24 Units Subcutaneous TID WC   insulin detemir  30 Units Subcutaneous BID   leflunomide  20 mg Oral Daily   morphine  100 mg Oral Q12H   nicotine  21 mg Transdermal Daily   predniSONE  2 mg Oral Q breakfast   pregabalin  150 mg Oral BID   Continuous Infusions: PRN Meds:.acetaminophen **OR** acetaminophen (TYLENOL) oral liquid 160 mg/5 mL **OR** acetaminophen  Antibiotics  :    Anti-infectives (From admission, onward)    None         Objective:   Vitals:   08/31/22 2045 08/31/22 2131 08/31/22 2200 09/01/22 0759  BP: 118/78  122/89 (!) 137/94  Pulse: 98  99 87  Resp: 12  19 (!) 21  Temp:  98.5 F (36.9 C) 97.7 F (36.5 C) 98.1 F (36.7 C)  TempSrc:  Oral Oral Oral  SpO2: 96%  94%   Weight:      Height:        Wt Readings from Last 3 Encounters:  08/31/22 73.9 kg  08/22/22 73.9 kg  08/20/22 74 kg     Intake/Output Summary (Last 24 hours) at 09/01/2022 0932 Last data filed at 08/31/2022 2314 Gross per 24 hour  Intake --  Output 450 ml  Net -  450 ml     Physical Exam  Awake Alert, No new F.N deficits, Normal affect [place].AT,PERRAL Supple Neck, No JVD,   Symmetrical Chest wall movement, Good air movement bilaterally, CTAB RRR,No Gallops,Rubs or new Murmurs,  +ve B.Sounds, Abd Soft, No tenderness,   R BKA, L foot 4 th ray amputation.       Data Review:    Recent Labs  Lab 08/31/22 1735 08/31/22 2027  WBC 7.0 6.6  HGB 14.8 14.6  HCT 44.1 44.3  PLT 242 243  MCV 88.9 89.0  MCH 29.8 29.3  MCHC 33.6 33.0  RDW 14.9 14.8  LYMPHSABS 1.2  --   MONOABS 0.7  --   EOSABS 0.3  --   BASOSABS 0.1  --     Recent Labs  Lab  08/31/22 1735 08/31/22 2027  NA 134*  --   K 3.8  --   CL 91*  --   CO2 28  --   ANIONGAP 15  --   GLUCOSE 454*  --   BUN 11  --   CREATININE 0.97 0.77  AST 23  --   ALT 45*  --   ALKPHOS 80  --   BILITOT 0.7  --   ALBUMIN 3.4*  --   INR 1.0  --   CALCIUM 9.2  --      Lab Results  Component Value Date   CHOL 72 09/01/2022   HDL 24 (L) 09/01/2022   LDLCALC 29 09/01/2022   TRIG 96 09/01/2022   CHOLHDL 3.0 09/01/2022    Lab Results  Component Value Date   HGBA1C 7.0 (H) 08/21/2022     Radiology Reports CT ANGIO HEAD NECK W WO CM  Result Date: 09/01/2022 CLINICAL DATA:  73 year old male with vision loss, left MCA and left MCA/PCA watershed infarcts on brain MRI. EXAM: CT ANGIOGRAPHY HEAD AND NECK WITH AND WITHOUT CONTRAST TECHNIQUE: Multidetector CT imaging of the head and neck was performed using the standard protocol during bolus administration of intravenous contrast. Multiplanar CT image reconstructions and MIPs were obtained to evaluate the vascular anatomy. Carotid stenosis measurements (when applicable) are obtained utilizing NASCET criteria, using the distal internal carotid diameter as the denominator. RADIATION DOSE REDUCTION: This exam was performed according to the departmental dose-optimization program which includes automated exposure control, adjustment of the mA and/or kV according to patient size and/or use of iterative reconstruction technique. CONTRAST:  75mL OMNIPAQUE IOHEXOL 350 MG/ML SOLN COMPARISON:  Brain MRI 0006 hours today.  Head CT yesterday. FINDINGS: CT HEAD Brain: Left hemisphere cytotoxic edema corresponding to MRI diffusion abnormality is most confluent at the junction of the left parietal and occipital lobes (sagittal image 39), mildly progressed from the CT yesterday. No malignant hemorrhagic transformation. No intracranial mass effect. Stable ventricular system without enlargement. Stable gray-white differentiation elsewhere. Normal basilar  cisterns. Calvarium and skull base: No acute osseous abnormality identified. Paranasal sinuses: Visualized paranasal sinuses and mastoids are stable and well aerated. Orbits: Visualized orbits and scalp soft tissues are within normal limits. CTA NECK Skeleton: Absent maxillary dentition. Cervical spine degeneration superimposed on C5-C6 ankylosis. Mild T1 and T2 superior endplate compression. No acute osseous abnormality identified. Upper chest: Mild upper lobe emphysema and subpleural lung scarring. Negative visible superior mediastinum. Other neck: No acute finding. Aortic arch: Extensive Calcified aortic atherosclerosis. 3 vessel arch. Right carotid system: Extensive brachiocephalic artery calcified plaque but no hemodynamically significant stenosis. Similar plaque at the right CCA origin and intermittently in the right CCA before  the bifurcation. Less than 50 % stenosis with respect to the distal vessel. Combined soft and calcified plaque at the right ICA origin, continuing into the bulb, and high-grade right ICA bulb stenosis numerically estimated at 70 % with respect to the distal vessel but approaching a radiographic string sign on series 11, image 122. Right ICA remains patent to the skull base. Left carotid system: Similar widespread calcified atherosclerosis in the left CCA and proximal ICA. Bulky left ICA bulb calcified plaque and high-grade stenosis approaching a radiographic string sign (series 13, image 114, numerically estimated at 70 % with respect to the distal vessel. Left ICA remains patent to the skull base. Vertebral arteries: Soft and calcified plaque at the right subclavian artery origin with subsequent stenosis of 73 % with respect to the distal vessel (series 12, image 95). Calcified right vertebral artery origin with moderate to severe stenosis on series 9, image 311 but the right vertebral artery remains patent. Right vertebral artery is dominant but also somewhat diminutive. No other  right vertebral plaque or stenosis to the skull base. Multifocal proximal left subclavian artery calcified plaque with stenosis estimated at 72 % with respect to the distal vessel on series 9, image 351. Left subclavian remains patent. Left vertebral artery origin is patent with mild adjacent calcified plaque but no significant stenosis. Non dominant and diminutive left vertebral artery is patent to the skull base with no stenosis. CTA HEAD Posterior circulation: Diminutive distal vertebral arteries are patent to the vertebrobasilar junction without atherosclerosis. The right V4 is dominant. Both PICA origins are patent. Patent but highly diminutive basilar artery with fetal type bilateral PCA origins. Diminutive basilar tip and both SCA origins remain patent (series 15, image 26. No discrete basilar stenosis. Right PCA branches are within normal limits. Left P1 and P2 segments are patent without stenosis. Partial P3 or P4 inferior division branch occlusion on series 16, image 24. Anterior circulation: Both ICA siphons are patent. Moderate left siphon calcified plaque but only mild left siphon stenosis. Normal left posterior communicating artery origin. Similar right siphon calcified plaque, mild right siphon stenosis. Normal right posterior communicating artery origin. Patent carotid termini, MCA and ACA origins. Dominant right A1 segment. Anterior communicating artery and bilateral ACA branches are within normal limits. Right MCA M1 segment and bifurcation are patent without stenosis. Right MCA branches are within normal limits. Left MCA M1 segment and trifurcation are patent without stenosis. No left MCA branch occlusion or irregularity is identified. Venous sinuses: Patent. Anatomic variants: Diminutive vertebrobasilar system on the basis of fetal type PCA origins, dominant right vertebral artery. Dominant right ACA A1 segment. Review of the MIP images confirms the above findings IMPRESSION: 1. Negative for  large vessel occlusion but Positive for Left PCA P3 or P4 branch occlusion. Left MCA is within normal limits. 2. Positive also for Severe atherosclerosis in the neck and at the thoracic inlet. Aortic Atherosclerosis (ICD10-I70.0), and high-grade stenoses: - 70% estimated stenosis at both ICA bulbs, both approaching a RADIOGRAPHIC STRING SIGN. Recommend follow-up with Vascular Surgery. 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 - Moderate to Severe stenosis of the Dominant Right Vertebral Artery origin. 3. Mild progression of Left hemisphere cytotoxic edema since the CT yesterday. No malignant hemorrhagic transformation or intracranial mass effect. Electronically Signed   By: Odessa Fleming M.D.   On: 09/01/2022 07:26   MR BRAIN WO CONTRAST  Result Date: 09/01/2022 CLINICAL DATA:  Initial evaluation for stroke. EXAM: MRI HEAD WITHOUT CONTRAST TECHNIQUE:  Multiplanar, multiecho pulse sequences of the brain and surrounding structures were obtained without intravenous contrast. COMPARISON:  CT from 08/31/2022. FINDINGS: Brain: Examination degraded by motion artifact. Cerebral volume within normal limits. Mild scattered patchy T2/FLAIR hyperintensity involving the periventricular and deep white matter, consistent with chronic small vessel ischemic disease, mild in nature. Patchy multifocal areas of restricted diffusion involving the left cerebral hemisphere, with greatest involvement at the left occipital lobe, consistent with acute ischemic infarcts. These are somewhat watershed in distribution. Largest area of infarction at the left occipital lobe measures up to 3.3 cm. Mild associated petechial hemorrhage without frank hemorrhagic transformation or significant mass effect. No other evidence for acute or subacute ischemia. Gray-white matter differentiation otherwise maintained. No other acute or chronic intracranial blood products. No mass lesion, midline shift or mass effect. No hydrocephalus or  extra-axial fluid collection. Pituitary gland suprasellar region within normal limits. Vascular: Major intracranial vascular flow voids are maintained. Diminutive vertebrobasilar system noted. Skull and upper cervical spine: Craniocervical junction within normal limits. Bone marrow signal intensity grossly normal. No scalp soft tissue abnormality. Sinuses/Orbits: Globes orbital soft tissues within normal limits. Paranasal sinuses are clear. No significant mastoid effusion. Other: None. IMPRESSION: 1. Patchy multifocal acute ischemic infarcts involving the left cerebral hemisphere, with greatest involvement at the left occipital lobe. Mild associated petechial hemorrhage without frank hemorrhagic transformation or significant mass effect. 2. Underlying mild chronic microvascular ischemic disease. Electronically Signed   By: Rise Mu M.D.   On: 09/01/2022 00:58   CT HEAD WO CONTRAST  Result Date: 08/31/2022 CLINICAL DATA:  Vision loss. EXAM: CT HEAD WITHOUT CONTRAST TECHNIQUE: Contiguous axial images were obtained from the base of the skull through the vertex without intravenous contrast. RADIATION DOSE REDUCTION: This exam was performed according to the departmental dose-optimization program which includes automated exposure control, adjustment of the mA and/or kV according to patient size and/or use of iterative reconstruction technique. COMPARISON:  None Available. FINDINGS: Brain: Patchy cortical and subcortical hypodensities are present in the superior aspect of the left occipital lobe, favored to reflect an acute to subacute infarct. No intracranial hemorrhage, midline shift, or extra-axial fluid collection is identified. Mild cerebral atrophy is within normal limits for age. Vascular: Calcified atherosclerosis at the skull base. No hyperdense vessel. Skull: No fracture or suspicious osseous lesion. Sinuses/Orbits: Chronic appearing opacification of a posterior left ethmoid air cell. Clear  mastoid air cells. Unremarkable orbits. Other: None. IMPRESSION: 1. Suspected acute to subacute left occipital lobe infarct. 2. No intracranial hemorrhage. Electronically Signed   By: Sebastian Ache M.D.   On: 08/31/2022 19:05      Signature  -   Susa Raring M.D on 09/01/2022 at 9:32 AM   -  To page go to [email or web address]

## 2022-09-01 NOTE — Evaluation (Signed)
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) 11/27/2018   Hyperlipidemia 11/25/2018   Carotid artery disease (HCC) 11/25/2018   Critical limb ischemia with history of revascularization of same extremity (HCC) 10/19/2018   Below-knee amputation of right lower extremity (HCC) 09/09/2018   Dehiscence of amputation stump (HCC) 08/18/2018   H/O amputation of lesser toe, left (HCC) 05/06/2018   Peripheral vascular disease (HCC) 04/19/2018   Diabetic foot infection (HCC) 04/18/2018   Tobacco abuse counseling    Critical lower limb ischemia (HCC) 05/02/2017   Subacute osteomyelitis of right fibula (HCC)    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, right, sequela (HCC) 02/03/2017   Diabetes mellitus, insulin dependent (IDDM), controlled    Type 2 diabetes mellitus with diabetic neuropathy, with long-term current use of insulin (HCC)    S/P unilateral BKA (below knee amputation) (HCC)    Rheumatoid arthritis (HCC)    Gallstones 09/23/2013   Non-intractable cyclical vomiting with nausea 09/23/2013   Epidermal inclusion cyst 11/30/2012   Past Medical History:  Past Medical History:  Diagnosis Date   Arthritis    "pretty much all over"   Cellulitis of left foot    COPD (chronic obstructive pulmonary disease) (HCC)    Dyspnea    Hyperlipidemia    Hypertension    Nausea and vomiting 08/20/2022   Neuromuscular disorder (HCC)    neuropathy legs   Neuropathy    legs   Osteomyelitis (HCC)  04/2018   4th toe left foot   Osteomyelitis of fourth toe of left foot (HCC) 04/19/2018   Peripheral vascular disease (HCC)    Type II diabetes mellitus (HCC)    Wears glasses    Wears partial dentures    top and bottom partials   Past Surgical History:  Past Surgical History:  Procedure Laterality Date   ABDOMINAL AORTOGRAM W/LOWER EXTREMITY Bilateral 10/19/2018   Procedure: ABDOMINAL AORTOGRAM W/LOWER EXTREMITY;  Surgeon: [name], MD;  Location: [facility];  Service: Cardiovascular;  Laterality: Bilateral;   ABOVE KNEE LEG AMPUTATION Right 11/27/2018   AMPUTATION Left 04/22/2018   Procedure: LEFT FOOT 4TH RAY AMPUTATION;  Surgeon: [name], MD;  Location: [facility] OR;  Service: Orthopedics;  Laterality: Left;   AMPUTATION Right 11/27/2018   Procedure: RIGHT ABOVE KNEE AMPUTATION;  Surgeon: [name], MD;  Location: [facility] OR;  Service: Orthopedics;  Laterality: Right;   BACK SURGERY     CHOLECYSTECTOMY N/A 10/26/2013   Procedure: LAPAROSCOPIC CHOLECYSTECTOMY WITH INTRAOPERATIVE CHOLANGIOGRAM;  Surgeon: Clovis Pu. Cornett, MD;  Location: [facility] OR;  Service: General;  Laterality: N/A;   COLONOSCOPY     CYST EXCISION Bilateral 02/15/2015   Procedure: LEFT INDEX FINGER AND RIGHT MIDDLE FINGER NODULE EXCISION;  Surgeon: Tarry Kos, MD;  Location: [facility];  Service: Orthopedics;  Laterality: Bilateral;   CYST EXCISION PERINEAL N/A 12/08/2012   Procedure: CYST EXCISION PERINeum;  Surgeon: Maisie Fus A. Cornett, MD;  Location: [facility];  Service: General;  Laterality: N/A;  FOOT AMPUTATION Right 2005   I & D EXTREMITY Right 03/07/2017   Procedure: EXCISION FIBULAR HEAD RIGHT BELOW KNEE AMPUTATION;  Surgeon: [name], MD;  Location: [facility] OR;  Service: Orthopedics;  Laterality: Right;   INCISE AND DRAIN ABCESS  12/02/2014   PERINEAL ABSCESS   INCISION AND DRAINAGE PERIRECTAL ABSCESS Left 12/02/2014   Procedure: IRRIGATION AND DEBRIDEMENT PERINEAL  ABSCESS;  Surgeon: Abigail Miyamoto, MD;  Location: [facility] OR;  Service: General;  Laterality: Left;   LEG AMPUTATION BELOW KNEE Right 2005   LOWER EXTREMITY ANGIOGRAPHY N/A 05/05/2017   Procedure: LOWER EXTREMITY ANGIOGRAPHY;  Surgeon: [name], MD;  Location: [facility];  Service: Cardiovascular;  Laterality: N/A;   LUMBAR DISC SURGERY  82,90   ruptured disc   PERIPHERAL VASCULAR INTERVENTION Right 05/05/2017   Procedure: PERIPHERAL VASCULAR INTERVENTION;  Surgeon: [name], MD;  Location: [facility];  Service: Cardiovascular;  Laterality: Right;   PERIPHERAL VASCULAR INTERVENTION Right 10/19/2018   Procedure: PERIPHERAL VASCULAR INTERVENTION;  Surgeon: [name], MD;  Location: [facility];  Service: Cardiovascular;  Laterality: Right;   SCAR REVISION Right 2005   @ amputation   STUMP REVISION Right 08/21/2018   Procedure: REVISION RIGHT BELOW KNEE AMPUTATION, EXCISION FIBULA;  Surgeon: [name], MD;  Location: [facility] OR;  Service: Orthopedics;  Laterality: Right;   STUMP REVISION Right 09/09/2018   Procedure: REVISION RIGHT BELOW KNEE AMPUTATION;  Surgeon: [name], MD;  Location: [facility] OR;  Service: Orthopedics;  Laterality: Right;   TOE AMPUTATION Right 20020   HPI:  72 year old male admitted with 2 day h/o right sided vision loss. Head CT with Patchy multifocal acute ischemic infarcts involving the left cerebral hemisphere, with greatest involvement at the left occipital lobe. Mild associated petechial hemorrhage without frank hemorrhagic transformation or significant mass effect.   Assessment / Plan / Recommendation Clinical Impression  Limited cognitive-linguistic evaluation complete. Overall, speech and language function appearing intact without evidence of dysarthria or aphasia. Auditory comprehension good. Short term memory appearing intact. With initiation of higher level mathmatical reasoning questions, patient became tearful and stated he  was overwhelmed, unable to focus to answer questions. Per son, "these types of questions may have been hard before." Also per son, patient appears to be at baseline from a cognitive-linguistic standpoint. Provided options of coming back for continued assessment vs deferring f/u given likely baseline status. Son wishes to defer f/u and will contact MD if changes are noticed.    SLP Assessment  SLP Recommendation/Assessment: Patient does not need any further Speech Lanaguage Pathology Services SLP Visit Diagnosis: Cognitive communication deficit (R41.841)    Recommendations for follow up therapy are one component of a multi-disciplinary discharge planning process, led by the attending physician.  Recommendations may be updated based on patient status, additional functional criteria and insurance authorization.    Follow Up Recommendations  No SLP follow up    Assistance Recommended at Discharge     Functional Status Assessment Patient has not had a recent decline in their functional status        SLP Evaluation Cognition  Overall Cognitive Status: Within Functional Limits for tasks assessed (see HPI) Orientation Level: Oriented X4       Comprehension  Auditory Comprehension Overall Auditory Comprehension: Appears within functional limits for tasks assessed Visual Recognition/Discrimination Discrimination: Not tested Reading Comprehension Reading Status: Not tested    Expression Expression Primary Mode of Expression: Verbal Verbal Expression  Overall Verbal Expression: Appears within functional limits for tasks assessed   Oral / Motor  Oral Motor/Sensory Function Overall Oral Motor/Sensory Function: Within functional limits Motor Speech Overall Motor Speech: Appears within functional limits for tasks assessed           Ferdinand Lango MA, CCC-SLP  Clarrissa Shimkus Meryl 09/01/2022, 9:59 AM

## 2022-09-01 NOTE — Progress Notes (Signed)
Patient is wanting to be discharged. Dr. Thedore Mins notified. MD stated he was not ready for discharge he would have to leave AMA. Charge nurse notified and MD wanted her to speak with patient and family. MD called and spoke with patient's sister and patient and sister have decided to stay until in the morning.

## 2022-09-01 NOTE — Progress Notes (Signed)
  Echocardiogram 2D Echocardiogram has been performed.  Brian Zavala 09/01/2022, 3:46 PM

## 2022-09-01 NOTE — Evaluation (Signed)
Occupational Therapy Evaluation Patient Details Name: Brian Zavala MRN: [id number] DOB: [birth year] Today's Date: 09/01/2022   History of Present Illness Brian Zavala is a 72 y.o. male who presented to the emergency due to vision loss.  CT head shows acute infarct of left occipital lobe. 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 PMHx: COPD, alcohol usage, neuropathy, type 2 diabetes, L toe amputation, R AKA, HLD, HTN, arthritis, current smoker, noncompliant BiPAP.   Clinical Impression   Pt evaluated s/p above admission list. Pt reports modified independence with ADL/IADLs, driving and mobility using power wheelchair at baseline. Pt presents this session with reports of improved vision from admission. Pt with mild visual deficits noted in right upper quadrant however, pt able to adequately scan environment to compensate. BUE AROM was [facility] and 5/5 strength. Mild incoordination noted with LUE during reciprocal finger to thumb, pt reports history of neuropathy. Pt currently requires setup A for seated UB ADLs and generalized supervision for LB ADLs. Pt completed lateral/scoot transfer from EOB>chair with mod I. Pt would benefit from continued acute OT services to maximize functional independence and facilitate transition to pt's natural home environment. Pt would benefit from outpatient OT services upon discharge for further vision assessment.       Recommendations for follow up therapy are one component of a multi-disciplinary discharge planning process, led by the attending physician.  Recommendations may be updated based on patient status, additional functional criteria and insurance authorization.   Assistance Recommended at Discharge Set up Supervision/Assistance  Patient can return home with the following Assist for transportation;Assistance with cooking/housework    Functional Status Assessment  Patient has had a recent decline in their  functional status and demonstrates the ability to make significant improvements in function in a reasonable and predictable amount of time.  Equipment Recommendations  None recommended by OT    Recommendations for Other Services       Precautions / Restrictions Precautions Precautions: Fall Precaution Comments: R AKA, L toe amp Restrictions Weight Bearing Restrictions: No      Mobility Bed Mobility Overal bed mobility: Modified Independent             General bed mobility comments: HOB elevated    Transfers Overall transfer level: Modified independent Equipment used: None               General transfer comment: Pt perfomed lateral scoot transfer from EOB>recliner with mod I.      Balance Overall balance assessment: Modified Independent                                         ADL either performed or assessed with clinical judgement   ADL Overall ADL's : Needs assistance/impaired Eating/Feeding: Sitting;Modified independent   Grooming: Set up;Sitting   Upper Body Bathing: Sitting;Set up   Lower Body Bathing: Supervison/ safety;Sitting/lateral leans   Upper Body Dressing : Set up;Sitting   Lower Body Dressing: Supervision/safety;Sitting/lateral leans   Toilet Transfer: Modified Independent;BSC/3in1 (lateral/scoot) Toilet Transfer Details (indicate cue type and reason): simulated Toileting- Clothing Manipulation and Hygiene: Supervision/safety;Sitting/lateral lean       Functional mobility during ADLs: Modified independent (lateral/scoot transfer) General ADL Comments: Generalized supervision for ADLs, did not formally assess.     Vision Baseline Vision/History: 1 Wears glasses Ability to See in Adequate Light: 0 Adequate Vision Assessment?: Vision impaired- to  be further tested in functional context Additional Comments: Pt with mild visual deficits noted in right upper quadrant. Pt demonstrates ability to adequately scan  environment to compensate for visual deficit. Pt reports improved vision from admission.     Perception Perception Perception Tested?: No   Praxis Praxis Praxis tested?: Not tested    Pertinent Vitals/Pain Pain Assessment Pain Assessment: No/denies pain     Hand Dominance Right   Extremity/Trunk Assessment Upper Extremity Assessment Upper Extremity Assessment: RUE deficits/detail;LUE deficits/detail RUE Deficits / Details: AROM WFL, 5/5 MMT, intact reciprocal finger to thumb, finger to nose and dysdiadochokinesia. RUE Sensation: history of peripheral neuropathy LUE Deficits / Details: AROM WFL, 5/5 MMT, intact dysdiadochokinesia and finger to nose. Mild incoordination noted with reciprocal finger to thumb, history or neuropathy. LUE Sensation: history of peripheral neuropathy   Lower Extremity Assessment Lower Extremity Assessment: Defer to PT evaluation   Cervical / Trunk Assessment Cervical / Trunk Assessment: Kyphotic   Communication Communication Communication: HOH   Cognition Arousal/Alertness: Awake/alert Behavior During Therapy: WFL for tasks assessed/performed Overall Cognitive Status: Within Functional Limits for tasks assessed                                 General Comments: A+O x4 with 1 cue for year, pleasant and cooperative     General Comments  VSS on RA, daughters present throughout    Exercises     Shoulder Instructions      Home Living Family/patient expects to be discharged to:: Private residence Living Arrangements: Alone Available Help at Discharge: Family;Available 24 hours/day Type of Home: Mobile home Home Access: Ramped entrance     Home Layout: One level         Bathroom Toilet: Handicapped height     Home Equipment: Wheelchair - power   Additional Comments: family lives near by  Lives With: Alone    Prior Functioning/Environment Prior Level of Function : Independent/Modified Independent              Mobility Comments: Mobilizes in PWC, indep lateral scoot transfers. Pt reports prosthetic is pain to wear. denies falls ADLs Comments: mod I, drives, manages medicaion/money; active with [facility].        OT Problem List: Impaired vision/perception      OT Treatment/Interventions: Self-care/ADL training;Therapeutic exercise;Neuromuscular education;Energy conservation;DME and/or AE instruction;Therapeutic activities;Visual/perceptual remediation/compensation;Patient/family education;Balance training    OT Goals(Current goals can be found in the care plan section) Acute Rehab OT Goals Patient Stated Goal: to go home OT Goal Formulation: With patient Time For Goal Achievement: 09/15/22 Potential to Achieve Goals: Good ADL Goals Additional ADL Goal #1: Pt will independently use scanning strategies during functional tasks Additional ADL Goal #2: Pt will complete all ADLs with mod I  OT Frequency: Min 2X/week    Co-evaluation              AM-PAC OT "6 Clicks" Daily Activity     Outcome Measure Help from another person eating meals?: None Help from another person taking care of personal grooming?: A Little Help from another person toileting, which includes using toliet, bedpan, or urinal?: A Little Help from another person bathing (including washing, rinsing, drying)?: A Little Help from another person to put on and taking off regular upper body clothing?: A Little Help from another person to put on and taking off regular lower body clothing?: A Little 6 Click Score: 19   End of Session  Equipment Utilized During Treatment: Other (comment) (none) Nurse Communication: Mobility status  Activity Tolerance: Patient tolerated treatment well Patient left: in chair;with call bell/phone within reach;with family/visitor present  OT Visit Diagnosis: Low vision, both eyes (H54.2)                Time: 1610-9604 OT Time Calculation (min): 27 min Charges:  OT General Charges $OT Visit: 1  Visit OT Evaluation $OT Eval Moderate Complexity: 1 Mod OT Treatments $Therapeutic Activity: 8-22 mins  Sherley Bounds, OTS Acute Rehabilitation Services Office [phone] Secure Chat Communication Preferred   Sherley Bounds 09/01/2022, 2:45 PM

## 2022-09-01 NOTE — Progress Notes (Addendum)
STROKE TEAM PROGRESS NOTE   BRIEF HPI [name] is a 72 y.o. male with history of COPD, alcohol usage, neuropathy, type 2 diabetes who presents emergency due to vision loss of the right eye. Vision problems started Thursday afternoon and have started to improve on 6/30. Right homonymous hemianopsia seen in the CT head shows an acute to subacute left occipital lobe infarct.   SIGNIFICANT HOSPITAL EVENTS 6/29 - Admitted to [facility]  INTERM HISTORY/SUBJECTIVE Sister is at the bedside. Dr. Myra Gianotti consulted and plans for intervention on Wednesday.  CT angiogram showed bilateral greater than 70% carotid stenosis.  MRI shows left hemispheric watershed infarcts with the largest 1 in the left parieto-occipital region OBJECTIVE  CBC    Component Value Date/Time   WBC 6.6 08/31/2022 2027   RBC 4.98 08/31/2022 2027   HGB 14.6 08/31/2022 2027   HGB 14.9 05/02/2017 1408   HCT 44.3 08/31/2022 2027   HCT 43.3 05/02/2017 1408   PLT 243 08/31/2022 2027   PLT 235 05/02/2017 1408   MCV 89.0 08/31/2022 2027   MCV 85 05/02/2017 1408   MCH 29.3 08/31/2022 2027   MCHC 33.0 08/31/2022 2027   RDW 14.8 08/31/2022 2027   RDW 14.6 05/02/2017 1408   LYMPHSABS 1.2 08/31/2022 1735   LYMPHSABS 1.4 05/02/2017 1408   MONOABS 0.7 08/31/2022 1735   EOSABS 0.3 08/31/2022 1735   EOSABS 0.2 05/02/2017 1408   BASOSABS 0.1 08/31/2022 1735   BASOSABS 0.0 05/02/2017 1408    BMET    Component Value Date/Time   NA 134 (L) 08/31/2022 1735   NA 138 05/02/2017 1408   K 3.8 08/31/2022 1735   CL 91 (L) 08/31/2022 1735   CO2 28 08/31/2022 1735   GLUCOSE 454 (H) 08/31/2022 1735   BUN 11 08/31/2022 1735   BUN 8 05/02/2017 1408   CREATININE 0.77 08/31/2022 2027   CREATININE 0.84 12/04/2017 1446   CALCIUM 9.2 08/31/2022 1735   GFRNONAA >60 08/31/2022 2027    IMAGING past 24 hours CT ANGIO HEAD NECK W WO CM  Result Date: 09/01/2022 CLINICAL DATA:  72 year old male with vision loss, left MCA and left MCA/PCA  watershed infarcts on brain MRI. EXAM: CT ANGIOGRAPHY HEAD AND NECK WITH AND WITHOUT CONTRAST TECHNIQUE: Multidetector CT imaging of the head and neck was performed using the standard protocol during bolus administration of intravenous contrast. Multiplanar CT image reconstructions and MIPs were obtained to evaluate the vascular anatomy. Carotid stenosis measurements (when applicable) are obtained utilizing NASCET criteria, using the distal internal carotid diameter as the denominator. RADIATION DOSE REDUCTION: This exam was performed according to the departmental dose-optimization program which includes automated exposure control, adjustment of the mA and/or kV according to patient size and/or use of iterative reconstruction technique. CONTRAST:  75mL OMNIPAQUE IOHEXOL 350 MG/ML SOLN COMPARISON:  Brain MRI 0006 hours today.  Head CT yesterday. FINDINGS: CT HEAD Brain: Left hemisphere cytotoxic edema corresponding to MRI diffusion abnormality is most confluent at the junction of the left parietal and occipital lobes (sagittal image 39), mildly progressed from the CT yesterday. No malignant hemorrhagic transformation. No intracranial mass effect. Stable ventricular system without enlargement. Stable gray-white differentiation elsewhere. Normal basilar cisterns. Calvarium and skull base: No acute osseous abnormality identified. Paranasal sinuses: Visualized paranasal sinuses and mastoids are stable and well aerated. Orbits: Visualized orbits and scalp soft tissues are within normal limits. CTA NECK Skeleton: Absent maxillary dentition. Cervical spine degeneration superimposed on C5-C6 ankylosis. Mild T1 and T2 superior endplate compression.  No acute osseous abnormality identified. Upper chest: Mild upper lobe emphysema and subpleural lung scarring. Negative visible superior mediastinum. Other neck: No acute finding. Aortic arch: Extensive Calcified aortic atherosclerosis. 3 vessel arch. Right carotid system: Extensive  brachiocephalic artery calcified plaque but no hemodynamically significant stenosis. Similar plaque at the right CCA origin and intermittently in the right CCA before the bifurcation. Less than 50 % stenosis with respect to the distal vessel. Combined soft and calcified plaque at the right ICA origin, continuing into the bulb, and high-grade right ICA bulb stenosis numerically estimated at 70 % with respect to the distal vessel but approaching a radiographic string sign on series 11, image 122. Right ICA remains patent to the skull base. Left carotid system: Similar widespread calcified atherosclerosis in the left CCA and proximal ICA. Bulky left ICA bulb calcified plaque and high-grade stenosis approaching a radiographic string sign (series 13, image 114, numerically estimated at 70 % with respect to the distal vessel. Left ICA remains patent to the skull base. Vertebral arteries: Soft and calcified plaque at the right subclavian artery origin with subsequent stenosis of 73 % with respect to the distal vessel (series 12, image 95). Calcified right vertebral artery origin with moderate to severe stenosis on series 9, image 311 but the right vertebral artery remains patent. Right vertebral artery is dominant but also somewhat diminutive. No other right vertebral plaque or stenosis to the skull base. Multifocal proximal left subclavian artery calcified plaque with stenosis estimated at 72 % with respect to the distal vessel on series 9, image 351. Left subclavian remains patent. Left vertebral artery origin is patent with mild adjacent calcified plaque but no significant stenosis. Non dominant and diminutive left vertebral artery is patent to the skull base with no stenosis. CTA HEAD Posterior circulation: Diminutive distal vertebral arteries are patent to the vertebrobasilar junction without atherosclerosis. The right V4 is dominant. Both PICA origins are patent. Patent but highly diminutive basilar artery with fetal  type bilateral PCA origins. Diminutive basilar tip and both SCA origins remain patent (series 15, image 26. No discrete basilar stenosis. Right PCA branches are within normal limits. Left P1 and P2 segments are patent without stenosis. Partial P3 or P4 inferior division branch occlusion on series 16, image 24. Anterior circulation: Both ICA siphons are patent. Moderate left siphon calcified plaque but only mild left siphon stenosis. Normal left posterior communicating artery origin. Similar right siphon calcified plaque, mild right siphon stenosis. Normal right posterior communicating artery origin. Patent carotid termini, MCA and ACA origins. Dominant right A1 segment. Anterior communicating artery and bilateral ACA branches are within normal limits. Right MCA M1 segment and bifurcation are patent without stenosis. Right MCA branches are within normal limits. Left MCA M1 segment and trifurcation are patent without stenosis. No left MCA branch occlusion or irregularity is identified. Venous sinuses: Patent. Anatomic variants: Diminutive vertebrobasilar system on the basis of fetal type PCA origins, dominant right vertebral artery. Dominant right ACA A1 segment. Review of the MIP images confirms the above findings IMPRESSION: 1. Negative for large vessel occlusion but Positive for Left PCA P3 or P4 branch occlusion. Left MCA is within normal limits. 2. Positive also for Severe atherosclerosis in the neck and at the thoracic inlet. Aortic Atherosclerosis (ICD10-I70.0), and high-grade stenoses: - 70% estimated stenosis at both ICA bulbs, both approaching a RADIOGRAPHIC STRING SIGN. Recommend follow-up with Vascular Surgery. 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 - Moderate to Severe stenosis of the Dominant Right  Vertebral Artery origin. 3. Mild progression of Left hemisphere cytotoxic edema since the CT yesterday. No malignant hemorrhagic transformation or intracranial mass effect.  Electronically Signed   By: Odessa Fleming M.D.   On: 09/01/2022 07:26   MR BRAIN WO CONTRAST  Result Date: 09/01/2022 CLINICAL DATA:  Initial evaluation for stroke. EXAM: MRI HEAD WITHOUT CONTRAST TECHNIQUE: Multiplanar, multiecho pulse sequences of the brain and surrounding structures were obtained without intravenous contrast. COMPARISON:  CT from 08/31/2022. FINDINGS: Brain: Examination degraded by motion artifact. Cerebral volume within normal limits. Mild scattered patchy T2/FLAIR hyperintensity involving the periventricular and deep white matter, consistent with chronic small vessel ischemic disease, mild in nature. Patchy multifocal areas of restricted diffusion involving the left cerebral hemisphere, with greatest involvement at the left occipital lobe, consistent with acute ischemic infarcts. These are somewhat watershed in distribution. Largest area of infarction at the left occipital lobe measures up to 3.3 cm. Mild associated petechial hemorrhage without frank hemorrhagic transformation or significant mass effect. No other evidence for acute or subacute ischemia. Gray-white matter differentiation otherwise maintained. No other acute or chronic intracranial blood products. No mass lesion, midline shift or mass effect. No hydrocephalus or extra-axial fluid collection. Pituitary gland suprasellar region within normal limits. Vascular: Major intracranial vascular flow voids are maintained. Diminutive vertebrobasilar system noted. Skull and upper cervical spine: Craniocervical junction within normal limits. Bone marrow signal intensity grossly normal. No scalp soft tissue abnormality. Sinuses/Orbits: Globes orbital soft tissues within normal limits. Paranasal sinuses are clear. No significant mastoid effusion. Other: None. IMPRESSION: 1. Patchy multifocal acute ischemic infarcts involving the left cerebral hemisphere, with greatest involvement at the left occipital lobe. Mild associated petechial hemorrhage  without frank hemorrhagic transformation or significant mass effect. 2. Underlying mild chronic microvascular ischemic disease. Electronically Signed   By: Rise Mu M.D.   On: 09/01/2022 00:58   CT HEAD WO CONTRAST  Result Date: 08/31/2022 CLINICAL DATA:  Vision loss. EXAM: CT HEAD WITHOUT CONTRAST TECHNIQUE: Contiguous axial images were obtained from the base of the skull through the vertex without intravenous contrast. RADIATION DOSE REDUCTION: This exam was performed according to the departmental dose-optimization program which includes automated exposure control, adjustment of the mA and/or kV according to patient size and/or use of iterative reconstruction technique. COMPARISON:  None Available. FINDINGS: Brain: Patchy cortical and subcortical hypodensities are present in the superior aspect of the left occipital lobe, favored to reflect an acute to subacute infarct. No intracranial hemorrhage, midline shift, or extra-axial fluid collection is identified. Mild cerebral atrophy is within normal limits for age. Vascular: Calcified atherosclerosis at the skull base. No hyperdense vessel. Skull: No fracture or suspicious osseous lesion. Sinuses/Orbits: Chronic appearing opacification of a posterior left ethmoid air cell. Clear mastoid air cells. Unremarkable orbits. Other: None. IMPRESSION: 1. Suspected acute to subacute left occipital lobe infarct. 2. No intracranial hemorrhage. Electronically Signed   By: Sebastian Ache M.D.   On: 08/31/2022 19:05    Vitals:   08/31/22 2045 08/31/22 2131 08/31/22 2200 09/01/22 0759  BP: 118/78  122/89 (!) 137/94  Pulse: 98  99 87  Resp: 12  19 (!) 21  Temp:  98.5 F (36.9 C) 97.7 F (36.5 C) 98.1 F (36.7 C)  TempSrc:  Oral Oral Oral  SpO2: 96%  94%   Weight:      Height:         PHYSICAL EXAM General:  Alert, well-nourished, well-developed patient in no acute distress Psych:  Mood and affect appropriate for  situation CV: Regular rate and rhythm  on monitor Respiratory:  Regular, unlabored respirations on room air GI: Abdomen soft and nontender   NEURO:  Mental Status: AA&Ox3, patient is able to give clear and coherent history Speech/Language: speech is without dysarthria or aphasia.  Naming, repetition, fluency, and comprehension intact.  Cranial Nerves:  II: PERRL. Visual fields full.  III, IV, VI: EOMI. Eyelids elevate symmetrically.  V: Sensation is intact to light touch and symmetrical to face.  VII: Face is symmetrical resting and smiling VIII: hearing intact to voice. IX, X: Palate elevates symmetrically. Phonation is normal.  ZO:[id number] shrug 5/5. XII: tongue is midline without fasciculations. Motor: 5/5 strength to all muscle groups tested.  Tone: is normal and bulk is normal Sensation- Intact to light touch bilaterally. Extinction absent to light touch to DSS.   Coordination: FTN intact bilaterally, HKS: no ataxia in BLE.No drift.  Gait- deferred   ASSESSMENT/PLAN  Acute Ischemic Infarct:  Left parietal lobe infarct; MCA ACA watershed territory Etiology:  Large vessel disease in left ICA CT head - Suspected acute to subacute left occipital lobe infarct. No intracranial hemorrhage  CTA head & neck - Negative for large vessel occlusion but Positive for Left PCA P3 or P4 branch occlusion. Left MCA is within normal limits. Positive also for Severe atherosclerosis in the neck and at the thoracic inlet. Aortic Atherosclerosis (ICD10-I70.0), and high-grade stenoses: - 70% estimated stenosis at both ICA bulbs, both approaching a RADIOGRAPHIC STRING SIGN. Recommend follow-up with Vascular Surgery. 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 - Moderate to Severe stenosis of the Dominant Right Vertebral Artery origin.  Mild progression of Left hemisphere cytotoxic edema since the CT yesterday. No malignant hemorrhagic transformation or intracranial mass effect. MRI  Patchy multifocal acute ischemic  infarcts involving the left cerebral hemisphere, with greatest involvement at the left occipital lobe. Mild associated petechial hemorrhage without frank hemorrhagic transformation or significant mass effect. Underlying mild chronic microvascular ischemic disease LDL 29 HgbA1c 7.0 VTE prophylaxis - lovenox aspirin 81 mg daily prior to admission, now on asa 81mg  and plavix 75mg  Therapy recommendations:  pending evaluation  Disposition:  pending  Left ICA stenosis symptomatic, right ICA stenosis High-grade stenosis 70% at both ICA bulbs  Dr. 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 Yes Nicotine replacement therapy provided   Other Stroke Risk Factors ETOH use, alcohol level <10, advised to drink no more than 2 drink(s) a day   Hospital day # 1  Patient seen and examined by NP/APP with MD. MD to update note as needed.   Elmer Picker, DNP, FNP-BC [facility] Pager: [phone]  STROKE MD NOTE :  I have personally obtained history,examined this patient, reviewed notes, independently viewed imaging studies, participated in medical decision making and plan of care.ROS completed by me personally and pertinent positives fully documented  I have made any additions or clarifications directly to the above note. Agree  with note above.  Patient evaluated right-sided peripheral vision loss which was transient  and appears to have improved.  MRI scan shows left hemispheric watershed infarcts including left occipital lobe likely from symptomatic high-grade proximal left carotid stenosis.  Recommend dual antiplatelet therapy aspirin and Plavix and aggressive risk factor modification.  Vascular surgery consult follow-up early left carotid revascularization.  Discussed with Dr. Myra Gianotti vascular surgeon.  Discussed with Dr. Thedore Mins.  Greater than 50% time during this 50-minute visit was spent in counseling and coordination of care about symptomatic carotid stenosis and discussion about revascularization options and need for aggressive medical therapy and answered questions.  Delia Heady, MD Medical Director [facility] Pager: [phone] 09/01/2022 2:46 PM   To contact Stroke Continuity provider, please refer to [email or web address]. After hours, contact General Neurology

## 2022-09-02 ENCOUNTER — Other Ambulatory Visit: Payer: Self-pay

## 2022-09-02 DIAGNOSIS — I6522 Occlusion and stenosis of left carotid artery: Secondary | ICD-10-CM

## 2022-09-02 LAB — GLUCOSE, CAPILLARY: Glucose-Capillary: 132 mg/dL — ABNORMAL HIGH (ref 70–99)

## 2022-09-02 MED ORDER — CLOPIDOGREL BISULFATE 75 MG PO TABS: 75.0000 mg | ORAL_TABLET | Freq: Every day | ORAL | 0 refills | Status: AC

## 2022-09-02 NOTE — Discharge Instructions (Addendum)
You have decided to go home today at your own responsibility, you do not want to wait to stay in the hospital for your vascular surgery on 09/04/2022.  You resume all responsibility for any adverse outcome including another stroke, disability or death.  Please do not drive till your vision is back to your baseline or you have been cleared by a primary care physician to do so.    Follow with Primary MD Renato Gails, Tiffany L, DO in 7 days   Get CBC, CMP, 2 view Chest X ray -  checked next visit with your primary MD and the vascular surgery as recommended, call the office today.  Activity: As tolerated with Full fall precautions use walker/cane & assistance as needed  Disposition Home    Diet: Heart Healthy-low carbohydrate dysphagia 3 consistency diet with feeding assistance and aspiration precautions.  Check your CBGs q. ACH S.  Special Instructions: If you have smoked or chewed Tobacco  in the last 2 yrs please stop smoking, stop any regular Alcohol  and or any Recreational drug use.  On your next visit with your primary care physician please Get Medicines reviewed and adjusted.  Please request your Prim.MD to go over all Hospital Tests and Procedure/Radiological results at the follow up, please get all Hospital records sent to your Prim MD by signing hospital release before you go home.  If you experience worsening of your admission symptoms, develop shortness of breath, life threatening emergency, suicidal or homicidal thoughts you must seek medical attention immediately by calling 911 or calling your MD immediately  if symptoms less severe.  You Must read complete instructions/literature along with all the possible adverse reactions/side effects for all the Medicines you take and that have been prescribed to you. Take any new Medicines after you have completely understood and accpet all the possible adverse reactions/side effects.

## 2022-09-02 NOTE — Progress Notes (Signed)
  Progress Note    09/02/2022 7:43 AM * No surgery found *  Subjective:  no complaints.  Denies any further neuro events.  Plan is for patient to discharge home today   Vitals:   09/01/22 2302 09/02/22 0502  BP: 122/75 101/73  Pulse: 94 86  Resp: 18 11  Temp: 98.2 F (36.8 C) 98 F (36.7 C)  SpO2: 93% 95%   Physical Exam: Lungs:  non labored Extremities:  moving all ext well Neurologic: CN grossly intact  CBC    Component Value Date/Time   WBC 6.6 08/31/2022 2027   RBC 4.98 08/31/2022 2027   HGB 14.6 08/31/2022 2027   HGB 14.9 05/02/2017 1408   HCT 44.3 08/31/2022 2027   HCT 43.3 05/02/2017 1408   PLT 243 08/31/2022 2027   PLT 235 05/02/2017 1408   MCV 89.0 08/31/2022 2027   MCV 85 05/02/2017 1408   MCH 29.3 08/31/2022 2027   MCHC 33.0 08/31/2022 2027   RDW 14.8 08/31/2022 2027   RDW 14.6 05/02/2017 1408   LYMPHSABS 1.2 08/31/2022 1735   LYMPHSABS 1.4 05/02/2017 1408   MONOABS 0.7 08/31/2022 1735   EOSABS 0.3 08/31/2022 1735   EOSABS 0.2 05/02/2017 1408   BASOSABS 0.1 08/31/2022 1735   BASOSABS 0.0 05/02/2017 1408    BMET    Component Value Date/Time   NA 134 (L) 08/31/2022 1735   NA 138 05/02/2017 1408   K 3.8 08/31/2022 1735   CL 91 (L) 08/31/2022 1735   CO2 28 08/31/2022 1735   GLUCOSE 454 (H) 08/31/2022 1735   BUN 11 08/31/2022 1735   BUN 8 05/02/2017 1408   CREATININE 0.77 08/31/2022 2027   CREATININE 0.84 12/04/2017 1446   CALCIUM 9.2 08/31/2022 1735   GFRNONAA >60 08/31/2022 2027   GFRAA >60 11/27/2018 0931    INR    Component Value Date/Time   INR 1.0 08/31/2022 1735     Intake/Output Summary (Last 24 hours) at 09/02/2022 0743 Last data filed at 09/02/2022 0500 Gross per 24 hour  Intake 960 ml  Output 925 ml  Net 35 ml     Assessment/Plan:  72 y.o. male with B ICA stenosis  Patient is discharging home today Plan is for L TCAR on Wednesday He will need plavix, aspirin, and statin daily Office will call to discuss plan for  surgery on Wednesday   Emilie Rutter, PA-C [facility] [phone] 09/02/2022 7:43 AM

## 2022-09-02 NOTE — Discharge Summary (Signed)
Brian Zavala ZOX:[id number] DOB: 1950-06-23 DOA: 08/31/2022  PCP: Kermit Balo, DO  Admit date: 08/31/2022  Discharge date: 09/02/2022  Admitted From: Home   Disposition:  Home   Recommendations for Outpatient Follow-up:   Follow up with PCP in 1-2 weeks  PCP Please obtain BMP/CBC, 2 view CXR in 1week,  (see Discharge instructions)   PCP Please follow up on the following pending results: He is make sure he follows closely with vascular surgery, neurology and cardiology on a prompt basis.  He wants to be discharged home right away or else he is going to sign out AMA today.   Home Health: None   Equipment/Devices: None  Consultations: Neuro, VVS Discharge Condition: Stable    CODE STATUS: Full    Diet Recommendation: Heart Healthy low carbohydrate dysphagia 3 consistency diet  Chief Complaint  Patient presents with   Loss of Vision     Brief history of present illness from the day of admission and additional interim summary    72 y.o. male with medical history significant of COPD, alcohol usage, neuropathy, type 2 diabetes who presents emergency due to vision loss.  Patient lost vision in his right eye for the last 2 days, workup consistent with CVA.                                                                  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 He has been started on DAPT along with high intensity statin, of note he was taking aspirin and high intensity statin prior to admission, LDL stable, recent diagnosis of DM type II, A1c noted currently on insulin regimen which will be continued, full  stroke protocol, echocardiogram ordered.  He was seen by vascular surgery and he was due for carotid surgery on 09/04/2022 however he has expressed his desire that he needs to be discharged right away, he has been threatening to leave AMA since yesterday, he has been strictly counseled, mother bedside, he wants to be discharged at his own responsibility and has agreed to follow-up with PCP, neurology and vascular surgery in the outpatient setting.     Hypertension.  Continue home regimen post discharge from tomorrow   Dyslipidemia.  Continue high intensity statin at home dose, LDL stable.   Smoking.  Counseled to quit.  NicoDerm patch.   PAD - R BKA, L foot 4 th ray amputation.   History of B12 deficiency.  Continue oral replacement.   Chronic pain.  On MS Contin chronically which will be continued.     History of chronic arthritis.  Claims he is on chronic prednisone for it.  Continue.     DM type II.  A1c noted.  Continue present dose insulin.    Discharge diagnosis     Principal Problem:   CVA (cerebral vascular accident) [facility])    Discharge instructions    Discharge Instructions     Ambulatory Referral for Lung Cancer Scre   Complete by: As directed    Discharge instructions   Complete by: As directed    You have decided to go home today at your own responsibility, you do not want to wait to stay in the hospital for your vascular surgery on 09/04/2022.  You resume all responsibility for any adverse outcome including another stroke, disability or death.  Please do not drive till your vision is back to your baseline or you have been cleared by a primary care physician to do so.    Follow with Primary MD Renato Gails, Tiffany L, DO in 7 days   Get CBC, CMP, 2 view Chest X ray -  checked next visit with your primary MD and the vascular surgery as recommended, call the office today.  Activity: As tolerated with Full fall precautions use walker/cane & assistance as  needed  Disposition Home    Diet: Heart Healthy-low carbohydrate dysphagia 3 consistency diet with feeding assistance and aspiration precautions.  Check your CBGs q. [facility].  Special Instructions: If you have smoked or chewed Tobacco  in the last 2 yrs please stop smoking, stop any regular Alcohol  and or any Recreational drug use.  On your next visit with your primary care physician please Get Medicines reviewed and adjusted.  Please request your Prim.MD to go over all Hospital Tests and Procedure/Radiological results at the follow up, please get all Hospital records sent to your Prim MD by signing hospital release before you go home.  If you experience worsening of your admission symptoms, develop shortness of breath, life threatening emergency, suicidal or homicidal thoughts you must seek medical attention immediately by calling 911 or calling your MD immediately  if symptoms less severe.  You Must read complete instructions/literature along with all the possible adverse reactions/side effects for all the Medicines you take and that have been prescribed to you. Take any new Medicines after you have completely understood and accpet all the possible adverse reactions/side effects.   Increase activity slowly   Complete by: As directed    No wound care   Complete by: As directed        Discharge Medications   Allergies as of 09/02/2022       Reactions   Varenicline Nausea Only        Medication List     STOP taking these medications    cefdinir 300 MG capsule Commonly known as: OMNICEF       TAKE these medications    acetaminophen 500 MG tablet Commonly known as: TYLENOL Take 1,000 mg by mouth every 6 (six) hours as needed for moderate pain.   aspirin 81 MG chewable tablet Chew 81 mg by mouth daily.   atorvastatin 80 MG tablet Commonly known as: LIPITOR Take 1 tablet (80 mg total) by mouth daily at 6 PM.   calcium carbonate 1500 (600 Ca) MG Tabs tablet Commonly  known as: OSCAL Take 600 mg of elemental calcium by mouth daily with breakfast.   cholecalciferol 1000 units tablet Commonly known as: VITAMIN D Take 1,000 Units by mouth daily.   clopidogrel 75 MG tablet Commonly known  as: PLAVIX Take 1 tablet (75 mg total) by mouth daily.   Enbrel 50 MG/ML injection Generic drug: etanercept Inject 50 mg into the skin once a week.   Eucerin Advanced Repair Crea Apply 1 application  topically 2 (two) times daily.   fluticasone 50 MCG/ACT nasal spray Commonly known as: FLONASE Place 2 sprays into both nostrils daily as needed for allergies or rhinitis.   furosemide 20 MG tablet Commonly known as: LASIX Take 20 mg by mouth daily as needed for fluid or edema.   hydrochlorothiazide 12.5 MG capsule Commonly known as: MICROZIDE Take 12.5 mg by mouth daily.   leflunomide 20 MG tablet Commonly known as: ARAVA Take 20 mg by mouth daily.   metoprolol succinate 25 MG 24 hr tablet Commonly known as: TOPROL-XL Take 25 mg by mouth at bedtime.   morphine 100 MG 12 hr tablet Commonly known as: MS CONTIN Take 100 mg by mouth every 12 (twelve) hours.   nicotine 21 mg/24hr patch Commonly known as: NICODERM CQ - dosed in mg/24 hours Place 21 mg onto the skin daily.   ondansetron 4 MG disintegrating tablet Commonly known as: ZOFRAN-ODT Take 1 tablet (4 mg total) by mouth every 8 (eight) hours as needed for nausea or vomiting.   Ozempic (0.25 or 0.5 MG/DOSE) 2 MG/1.5ML Sopn Generic drug: Semaglutide(0.25 or 0.5MG /DOS) Inject 0.5 mg into the skin once a week.   predniSONE 1 MG tablet Commonly known as: DELTASONE Take 2 mg by mouth daily with breakfast.   pregabalin 150 MG capsule Commonly known as: LYRICA Take 150 mg by mouth 2 (two) times daily.   PREVIDENT 5000 BOOSTER PLUS DT Place 1 application  onto teeth every evening.   promethazine 25 MG tablet Commonly known as: PHENERGAN Take 25 mg by mouth every 8 (eight) hours as needed for  nausea or vomiting.   promethazine 25 MG/ML injection Commonly known as: PHENERGAN Inject 0.5 mLs (12.5 mg total) into the vein once as needed for nausea or vomiting. If not responding to oral promethazine.   Semglee 100 UNIT/ML injection Generic drug: insulin glargine Inject 75 Units into the skin daily.   vitamin B-12 100 MCG tablet Commonly known as: CYANOCOBALAMIN Take 100 mcg by mouth daily.         Follow-up Information     Reed, Tiffany L, DO. Schedule an appointment as soon as possible for a visit in 1 week(s).   Specialty: Geriatric Medicine Contact information: [address] [place] [address] [phone]         Nada Libman, MD. Schedule an appointment as soon as possible for a visit in 1 day(s).   Specialties: Vascular Surgery, Cardiology Contact information: [address] [place] [address] [phone]         [facility]. Schedule an appointment as soon as possible for a visit in 1 week(s).   Specialty: Neurology Contact information: [address] [place] [address] [place] [address] [phone]                Major procedures and Radiology Reports - PLEASE review detailed and final reports thoroughly  -       ECHOCARDIOGRAM COMPLETE  Result Date: 09/01/2022    ECHOCARDIOGRAM REPORT   Patient Name:   Brian Zavala Date of Exam: 09/01/2022 Medical Rec #:  [id number]        Height:       73.0 in Accession #:    [id number]  Weight:       163.0 lb Date of Birth:  [birth year]        BSA:          1.972 m Patient Age:    71 years         BP:           95/65 mmHg Patient Gender: M                HR:           98 bpm. Exam Location:  Inpatient Procedure: 2D Echo, Color Doppler and Cardiac Doppler Indications:    Stroke I63.9  History:        Patient has no prior history of Echocardiogram examinations.                 COPD; Risk Factors:Current Smoker, Hypertension, Dyslipidemia                  and Diabetes.  Sonographer:    Aron Baba Referring Phys: [id number] ROBERT DORRELL  Sonographer Comments: Image acquisition challenging due to respiratory motion. IMPRESSIONS  1. Left ventricular ejection fraction, by estimation, is 45 to 50%. The left ventricle has mildly decreased function. Left ventricular endocardial border not optimally defined to evaluate regional wall motion. Left ventricular diastolic parameters are consistent with Grade I diastolic dysfunction (impaired relaxation).  2. Right ventricular systolic function is normal. The right ventricular size is normal. Tricuspid regurgitation signal is inadequate for assessing PA pressure.  3. The mitral valve was not well visualized. No evidence of mitral valve regurgitation. No evidence of mitral stenosis.  4. The aortic valve was not well visualized. Aortic valve regurgitation is not visualized. No aortic stenosis is present.  5. The inferior vena cava is normal in size with greater than 50% respiratory variability, suggesting right atrial pressure of 3 mmHg. Comparison(s): No prior Echocardiogram. FINDINGS  Left Ventricle: Left ventricular ejection fraction, by estimation, is 45 to 50%. The left ventricle has mildly decreased function. Left ventricular endocardial border not optimally defined to evaluate regional wall motion. The left ventricular internal cavity size was normal in size. There is no left ventricular hypertrophy. Left ventricular diastolic parameters are consistent with Grade I diastolic dysfunction (impaired relaxation). Right Ventricle: The right ventricular size is normal. No increase in right ventricular wall thickness. Right ventricular systolic function is normal. Tricuspid regurgitation signal is inadequate for assessing PA pressure. Left Atrium: Left atrial size was normal in size. Right Atrium: Right atrial size was normal in size. Pericardium: There is no evidence of pericardial effusion. Mitral Valve: The mitral valve was  not well visualized. No evidence of mitral valve regurgitation. No evidence of mitral valve stenosis. Tricuspid Valve: The tricuspid valve is not well visualized. Tricuspid valve regurgitation is not demonstrated. No evidence of tricuspid stenosis. Aortic Valve: The aortic valve was not well visualized. Aortic valve regurgitation is not visualized. No aortic stenosis is present. Pulmonic Valve: The pulmonic valve was not well visualized. Pulmonic valve regurgitation is not visualized. No evidence of pulmonic stenosis. Aorta: The aortic root and ascending aorta are structurally normal, with no evidence of dilitation. Venous: The inferior vena cava is normal in size with greater than 50% respiratory variability, suggesting right atrial pressure of 3 mmHg. IAS/Shunts: No atrial level shunt detected by color flow Doppler.  LEFT VENTRICLE PLAX 2D LVIDd:         3.80 cm     Diastology LVIDs:  2.90 cm     LV e' medial:    5.77 cm/s LV PW:         1.20 cm     LV E/e' medial:  10.6 LV IVS:        0.80 cm     LV e' lateral:   5.55 cm/s LVOT diam:     2.00 cm     LV E/e' lateral: 11.0 LV SV:         41 LV SV Index:   21 LVOT Area:     3.14 cm  LV Volumes (MOD) LV vol d, MOD A2C: 36.9 ml LV vol d, MOD A4C: 82.8 ml LV vol s, MOD A2C: 26.0 ml LV vol s, MOD A4C: 40.6 ml LV SV MOD A2C:     10.9 ml LV SV MOD A4C:     82.8 ml LV SV MOD BP:      26.1 ml RIGHT VENTRICLE RV S prime:     9.88 cm/s TAPSE (M-mode): 1.8 cm LEFT ATRIUM             Index        RIGHT ATRIUM          Index LA diam:        2.70 cm 1.37 cm/m   RA Area:     8.44 cm LA Vol (A2C):   19.4 ml 9.84 ml/m   RA Volume:   12.00 ml 6.08 ml/m LA Vol (A4C):   29.8 ml 15.11 ml/m LA Biplane Vol: 24.8 ml 12.57 ml/m  AORTIC VALVE LVOT Vmax:   80.00 cm/s LVOT Vmean:  50.500 cm/s LVOT VTI:    0.129 m  AORTA Ao Root diam: 3.60 cm Ao Asc diam:  2.70 cm MITRAL VALVE MV Area (PHT): 3.68 cm     SHUNTS MV Decel Time: 206 msec     Systemic VTI:  0.13 m MV E velocity: 61.30  cm/s   Systemic Diam: 2.00 cm MV A velocity: 122.00 cm/s MV E/A ratio:  0.50 Vishnu Priya Mallipeddi Electronically signed by Winfield Rast Mallipeddi Signature Date/Time: 09/01/2022/4:15:37 PM    Final    CT ANGIO HEAD NECK W WO CM  Result Date: 09/01/2022 CLINICAL DATA:  72 year old male with vision loss, left MCA and left MCA/PCA watershed infarcts on brain MRI. EXAM: CT ANGIOGRAPHY HEAD AND NECK WITH AND WITHOUT CONTRAST TECHNIQUE: Multidetector CT imaging of the head and neck was performed using the standard protocol during bolus administration of intravenous contrast. Multiplanar CT image reconstructions and MIPs were obtained to evaluate the vascular anatomy. Carotid stenosis measurements (when applicable) are obtained utilizing NASCET criteria, using the distal internal carotid diameter as the denominator. RADIATION DOSE REDUCTION: This exam was performed according to the departmental dose-optimization program which includes automated exposure control, adjustment of the mA and/or kV according to patient size and/or use of iterative reconstruction technique. CONTRAST:  75mL OMNIPAQUE IOHEXOL 350 MG/ML SOLN COMPARISON:  Brain MRI 0006 hours today.  Head CT yesterday. FINDINGS: CT HEAD Brain: Left hemisphere cytotoxic edema corresponding to MRI diffusion abnormality is most confluent at the junction of the left parietal and occipital lobes (sagittal image 39), mildly progressed from the CT yesterday. No malignant hemorrhagic transformation. No intracranial mass effect. Stable ventricular system without enlargement. Stable gray-white differentiation elsewhere. Normal basilar cisterns. Calvarium and skull base: No acute osseous abnormality identified. Paranasal sinuses: Visualized paranasal sinuses and mastoids are stable and well aerated. Orbits: Visualized orbits and scalp soft tissues are within  normal limits. CTA NECK Skeleton: Absent maxillary dentition. Cervical spine degeneration superimposed on C5-C6  ankylosis. Mild T1 and T2 superior endplate compression. No acute osseous abnormality identified. Upper chest: Mild upper lobe emphysema and subpleural lung scarring. Negative visible superior mediastinum. Other neck: No acute finding. Aortic arch: Extensive Calcified aortic atherosclerosis. 3 vessel arch. Right carotid system: Extensive brachiocephalic artery calcified plaque but no hemodynamically significant stenosis. Similar plaque at the right CCA origin and intermittently in the right CCA before the bifurcation. Less than 50 % stenosis with respect to the distal vessel. Combined soft and calcified plaque at the right ICA origin, continuing into the bulb, and high-grade right ICA bulb stenosis numerically estimated at 70 % with respect to the distal vessel but approaching a radiographic string sign on series 11, image 122. Right ICA remains patent to the skull base. Left carotid system: Similar widespread calcified atherosclerosis in the left CCA and proximal ICA. Bulky left ICA bulb calcified plaque and high-grade stenosis approaching a radiographic string sign (series 13, image 114, numerically estimated at 70 % with respect to the distal vessel. Left ICA remains patent to the skull base. Vertebral arteries: Soft and calcified plaque at the right subclavian artery origin with subsequent stenosis of 73 % with respect to the distal vessel (series 12, image 95). Calcified right vertebral artery origin with moderate to severe stenosis on series 9, image 311 but the right vertebral artery remains patent. Right vertebral artery is dominant but also somewhat diminutive. No other right vertebral plaque or stenosis to the skull base. Multifocal proximal left subclavian artery calcified plaque with stenosis estimated at 72 % with respect to the distal vessel on series 9, image 351. Left subclavian remains patent. Left vertebral artery origin is patent with mild adjacent calcified plaque but no significant stenosis. Non  dominant and diminutive left vertebral artery is patent to the skull base with no stenosis. CTA HEAD Posterior circulation: Diminutive distal vertebral arteries are patent to the vertebrobasilar junction without atherosclerosis. The right V4 is dominant. Both PICA origins are patent. Patent but highly diminutive basilar artery with fetal type bilateral PCA origins. Diminutive basilar tip and both SCA origins remain patent (series 15, image 26. No discrete basilar stenosis. Right PCA branches are within normal limits. Left P1 and P2 segments are patent without stenosis. Partial P3 or P4 inferior division branch occlusion on series 16, image 24. Anterior circulation: Both ICA siphons are patent. Moderate left siphon calcified plaque but only mild left siphon stenosis. Normal left posterior communicating artery origin. Similar right siphon calcified plaque, mild right siphon stenosis. Normal right posterior communicating artery origin. Patent carotid termini, MCA and ACA origins. Dominant right A1 segment. Anterior communicating artery and bilateral ACA branches are within normal limits. Right MCA M1 segment and bifurcation are patent without stenosis. Right MCA branches are within normal limits. Left MCA M1 segment and trifurcation are patent without stenosis. No left MCA branch occlusion or irregularity is identified. Venous sinuses: Patent. Anatomic variants: Diminutive vertebrobasilar system on the basis of fetal type PCA origins, dominant right vertebral artery. Dominant right ACA A1 segment. Review of the MIP images confirms the above findings IMPRESSION: 1. Negative for large vessel occlusion but Positive for Left PCA P3 or P4 branch occlusion. Left MCA is within normal limits. 2. Positive also for Severe atherosclerosis in the neck and at the thoracic inlet. Aortic Atherosclerosis (ICD10-I70.0), and high-grade stenoses: - 70% estimated stenosis at both ICA bulbs, both approaching a RADIOGRAPHIC STRING SIGN.  Recommend follow-up with Vascular Surgery. 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 - Moderate to Severe stenosis of the Dominant Right Vertebral Artery origin. 3. Mild progression of Left hemisphere cytotoxic edema since the CT yesterday. No malignant hemorrhagic transformation or intracranial mass effect. Electronically Signed   By: Odessa Fleming M.D.   On: 09/01/2022 07:26   MR BRAIN WO CONTRAST  Result Date: 09/01/2022 CLINICAL DATA:  Initial evaluation for stroke. EXAM: MRI HEAD WITHOUT CONTRAST TECHNIQUE: Multiplanar, multiecho pulse sequences of the brain and surrounding structures were obtained without intravenous contrast. COMPARISON:  CT from 08/31/2022. FINDINGS: Brain: Examination degraded by motion artifact. Cerebral volume within normal limits. Mild scattered patchy T2/FLAIR hyperintensity involving the periventricular and deep white matter, consistent with chronic small vessel ischemic disease, mild in nature. Patchy multifocal areas of restricted diffusion involving the left cerebral hemisphere, with greatest involvement at the left occipital lobe, consistent with acute ischemic infarcts. These are somewhat watershed in distribution. Largest area of infarction at the left occipital lobe measures up to 3.3 cm. Mild associated petechial hemorrhage without frank hemorrhagic transformation or significant mass effect. No other evidence for acute or subacute ischemia. Gray-white matter differentiation otherwise maintained. No other acute or chronic intracranial blood products. No mass lesion, midline shift or mass effect. No hydrocephalus or extra-axial fluid collection. Pituitary gland suprasellar region within normal limits. Vascular: Major intracranial vascular flow voids are maintained. Diminutive vertebrobasilar system noted. Skull and upper cervical spine: Craniocervical junction within normal limits. Bone marrow signal intensity grossly normal. No scalp soft tissue  abnormality. Sinuses/Orbits: Globes orbital soft tissues within normal limits. Paranasal sinuses are clear. No significant mastoid effusion. Other: None. IMPRESSION: 1. Patchy multifocal acute ischemic infarcts involving the left cerebral hemisphere, with greatest involvement at the left occipital lobe. Mild associated petechial hemorrhage without frank hemorrhagic transformation or significant mass effect. 2. Underlying mild chronic microvascular ischemic disease. Electronically Signed   By: Rise Mu M.D.   On: 09/01/2022 00:58   CT HEAD WO CONTRAST  Result Date: 08/31/2022 CLINICAL DATA:  Vision loss. EXAM: CT HEAD WITHOUT CONTRAST TECHNIQUE: Contiguous axial images were obtained from the base of the skull through the vertex without intravenous contrast. RADIATION DOSE REDUCTION: This exam was performed according to the departmental dose-optimization program which includes automated exposure control, adjustment of the mA and/or kV according to patient size and/or use of iterative reconstruction technique. COMPARISON:  None Available. FINDINGS: Brain: Patchy cortical and subcortical hypodensities are present in the superior aspect of the left occipital lobe, favored to reflect an acute to subacute infarct. No intracranial hemorrhage, midline shift, or extra-axial fluid collection is identified. Mild cerebral atrophy is within normal limits for age. Vascular: Calcified atherosclerosis at the skull base. No hyperdense vessel. Skull: No fracture or suspicious osseous lesion. Sinuses/Orbits: Chronic appearing opacification of a posterior left ethmoid air cell. Clear mastoid air cells. Unremarkable orbits. Other: None. IMPRESSION: 1. Suspected acute to subacute left occipital lobe infarct. 2. No intracranial hemorrhage. Electronically Signed   By: Sebastian Ache M.D.   On: 08/31/2022 19:05   CT Angio Abd/Pel W and/or Wo Contrast  Result Date: 08/20/2022 CLINICAL DATA:  Recent colitis worsening over the  last 3 days. EXAM: CTA ABDOMEN AND PELVIS WITHOUT AND WITH CONTRAST TECHNIQUE: Multidetector CT imaging of the abdomen and pelvis was performed using the standard protocol during bolus administration of intravenous contrast. Multiplanar reconstructed images and MIPs were obtained and reviewed to evaluate the vascular anatomy. RADIATION DOSE REDUCTION: This exam  was performed according to the departmental dose-optimization program which includes automated exposure control, adjustment of the mA and/or kV according to patient size and/or use of iterative reconstruction technique. CONTRAST:  OMNIPAQUE IOHEXOL 350 MG/ML SOLN COMPARISON:  Standard contrast CT 08/13/2022. FINDINGS: VASCULAR Aorta: Moderate calcified and noncalcified plaque throughout the abdominal aorta without dissection or aneurysm formation. Celiac: Standard branching pattern. Mild calcified plaque at the origin of the celiac. SMA: Mild calcified plaque at the origin of the SMA. Renals: Single bilateral main renal arteries. There is some calcified plaque at the origin of both vessels. Up to 50% stenosis on the left. Minimal on the right. IMA: Patent without evidence of aneurysm, dissection, vasculitis or significant stenosis. Inflow: Advanced partially calcified plaque along the iliac vessels with the areas of significant stenosis along the common iliac arteries. Patent lumen narrows to as small as 2.5 mm on the left and similar on the right. Please correlate for any evidence of symptoms including claudication. There is significant, severe calcified plaque along the internal iliac vessels and moderate severe along the external iliac is diffusely also with moderate areas of stenosis. Proximal Outflow: Calcified plaque also seen along the common femoral arteries with areas of significant stenosis greatest involving the proximal left common femoral artery with a severe stenosis. The right SFA is occluded proximally. Veins: No obvious venous  abnormality within the limitations of this arterial phase study. Review of the MIP images confirms the above findings. NON-VASCULAR Lower chest: Once again changes of chronic interstitial lung disease with peripheral areas of fibrosis with some bronchiectasis and honeycombing. No pleural effusion. Hepatobiliary: No focal liver abnormality is seen. Status post cholecystectomy. No biliary dilatation. Pancreas: Unremarkable. No pancreatic ductal dilatation or surrounding inflammatory changes. Spleen: Normal in size without focal abnormality. Adrenals/Urinary Tract: Adrenal glands are preserved. No enhancing renal mass or collecting system filling defect. Once again there is some small Bosniak 1 and 2 renal lesions, no specific imaging follow-up. Stomach/Bowel: No oral contrast. Large second portion duodenal diverticulum. The stomach is nondilated. Large bowel is of normal course and caliber with moderate colonic stool. Normal appendix. Distal ileal small bowel stool appearance. On the prior examination there was a subtle area of wall thickening along the distal descending colon. This area is less apparent today. This loop of bowel is nondilated. Lymphatic: Calcified lymph nodes in the upper abdomen. No other areas of specific lymph node enlargement. Reproductive: Prostate is unremarkable. Other: No free air or free fluid. Stable areas of wall thickening along the subcutaneous fat along the left side anteriorly. Musculoskeletal: Advanced degenerative changes of the spine and pelvis. Trace anterolisthesis of L3 on L4. Laminectomy changes at L5. Partial sacralization of the left side of L5. IMPRESSION: VASCULAR Extensive calcified plaque along the aorta and iliac vessels with areas of significant high-grade stenosis along the iliac vessels. There is occlusion of the right SFA and high-grade stenosis on the left. Please correlate with particular symptoms. Minimal calcified plaque along the celiac, SMA and IMA. NON-VASCULAR  Previous areas subtle wall thickening along the distal descending colon is less apparent today. No obstruction, free air or free fluid. Scattered colonic stool. Changes again of chronic interstitial lung disease at the lung bases. Stable mild wall thickening of the distal esophagus. Electronically Signed   By: Karen Kays M.D.   On: 08/20/2022 11:55   CT ABDOMEN PELVIS W CONTRAST  Result Date: 08/13/2022 CLINICAL DATA:  Acute abdominal pain. EXAM: CT ABDOMEN AND PELVIS WITH CONTRAST TECHNIQUE: Multidetector CT  imaging of the abdomen and pelvis was performed using the standard protocol following bolus administration of intravenous contrast. RADIATION DOSE REDUCTION: This exam was performed according to the departmental dose-optimization program which includes automated exposure control, adjustment of the mA and/or kV according to patient size and/or use of iterative reconstruction technique. CONTRAST:  OMNIPAQUE IOHEXOL 300 MG/ML  SOLN COMPARISON:  Pelvic CT 010932, chest CT 03/15/2021 reviewed FINDINGS: Lower chest: Chronic interstitial lung disease at the bases. Normal heart size with coronary artery calcifications. Mild wall thickening of the distal esophagus. Hepatobiliary: Diffuse hepatic steatosis. No focal liver abnormality. Clips in the gallbladder fossa postcholecystectomy. No biliary dilatation. Pancreas: No ductal dilatation or inflammation. Spleen: Normal in size without focal abnormality. Trace perisplenic free fluid. Adrenals/Urinary Tract: No adrenal nodule. No hydronephrosis or perinephric edema. Homogeneous renal enhancement with symmetric excretion on delayed phase imaging. No renal calculi. Simple right renal cyst needs no further imaging follow-up. There is mild diffuse bladder wall thickening questionable enhancement. No definite perivesicular fat stranding. Stomach/Bowel: Mixed liquid and solid stool throughout the colon, equivocal areas of colonic enhancement in the descending colon,  for example series 2, image 50. No colonic wall thickening. The appendix is normal. Occasional fluid-filled small bowel without inflammatory change mild wall thickening of the distal esophagus. There is a moderate to large periampullary duodenal diverticulum. Vascular/Lymphatic: Advanced aortic atherosclerosis. No aneurysm. Common and external iliac arteries are moderately calcified. There is superficial femoral artery appears to be occluded, likely chronic. Portal vein is patent. Multiple calcified lymph nodes in the porta hepatis and upper abdomen, likely sequela of prior granulomatous disease. No noncalcified adenopathy. Reproductive: Prostate is unremarkable. Other: Trace perisplenic free fluid. No free air. No abdominopelvic collection. Soft tissue densities in the anterior abdominal wall typical of medication injection sites. Musculoskeletal: There are no acute or suspicious osseous abnormalities. Mild T11 superior endplate compression fracture appears chronic. There is mild fatty atrophy of the right gluteal musculature. IMPRESSION: 1. Mixed liquid and solid stool throughout the colon, equivocal areas of colonic enhancement in the descending colon. This may represent mild colitis. 2. Mild diffuse bladder wall thickening, possible cystitis. Recommend correlation with urinalysis. 3. Mild wall thickening of the distal esophagus, can be seen with reflux or esophagitis. 4. Hepatic steatosis. 5. Chronic interstitial lung disease at the bases. 6. Advanced aortic and branch atherosclerosis. Right superficial femoral artery appears to be occluded, likely a chronic finding. Aortic Atherosclerosis (ICD10-I70.0). Electronically Signed   By: Narda Rutherford M.D.   On: 08/13/2022 21:16    Micro Results     No results found for this or any previous visit (from the past 240 hour(s)).  Today   Subjective    Brian Zavala today has no headache,no chest abdominal pain,no new weakness tingling or numbness, adamant  that he needs to be discharged home right away or else he is going to sign out AMA   Objective   Blood pressure 101/73, pulse 86, temperature 98 F (36.7 C), temperature source Oral, resp. rate 11, height 6\' 1"  (1.854 m), weight 73.9 kg, SpO2 95 %.   Intake/Output Summary (Last 24 hours) at 09/02/2022 0740 Last data filed at 09/02/2022 0500 Gross per 24 hour  Intake 960 ml  Output 925 ml  Net 35 ml    Exam  Awake Alert, No new F.N deficits, Normal affect [place].AT,PERRAL Supple Neck, No JVD,   Symmetrical Chest wall movement, Good air movement bilaterally, CTAB RRR,No Gallops,Rubs or new Murmurs,  +ve B.Sounds, Abd Soft, No  tenderness,   R BKA, L foot 4 th ray amputation.   Data Review   Recent Labs  Lab 08/31/22 1735 08/31/22 2027  WBC 7.0 6.6  HGB 14.8 14.6  HCT 44.1 44.3  PLT 242 243  MCV 88.9 89.0  MCH 29.8 29.3  MCHC 33.6 33.0  RDW 14.9 14.8  LYMPHSABS 1.2  --   MONOABS 0.7  --   EOSABS 0.3  --   BASOSABS 0.1  --     Recent Labs  Lab 08/31/22 1735 08/31/22 2027  NA 134*  --   K 3.8  --   CL 91*  --   CO2 28  --   ANIONGAP 15  --   GLUCOSE 454*  --   BUN 11  --   CREATININE 0.97 0.77  AST 23  --   ALT 45*  --   ALKPHOS 80  --   BILITOT 0.7  --   ALBUMIN 3.4*  --   INR 1.0  --   CALCIUM 9.2  --    Lab Results  Component Value Date   CHOL 72 09/01/2022   HDL 24 (L) 09/01/2022   LDLCALC 29 09/01/2022   TRIG 96 09/01/2022   CHOLHDL 3.0 09/01/2022   Lab Results  Component Value Date   HGBA1C 7.0 (H) 08/21/2022     Total Time in preparing paper work, data evaluation and todays exam - 35 minutes  Signature  -    Susa Raring M.D on 09/02/2022 at 7:40 AM   -  To page go to [email or web address]

## 2022-09-03 ENCOUNTER — Other Ambulatory Visit: Payer: Self-pay

## 2022-09-03 ENCOUNTER — Encounter (HOSPITAL_COMMUNITY): Payer: Self-pay | Admitting: Surgery

## 2022-09-03 NOTE — Progress Notes (Signed)
Called pt this AM to review his health history and give instructions for his surgery tomorrow. He asked that I call his sister, Beecher Mcardle and talk with her. I called Jacque and she was able to review his health history with me. Pt recently had a stroke with no lasting weakness or paralysis. Pt has hx of HTN and Diabetes. Pt's last A1C was 7.0 on 08/21/22. Pt is on Ozempic, his last dose was Saturday, 08/31/22. Instructed Jacque to have pt take 1/2 of his regular dose of Insulin Semglee day of surgery. He will take 37 units. Instructed her to have pt check his blood sugar when he wakes up in the AM and every 2 hours until he leaves for the hospital. If blood sugar is 70 or below, treat with 1/2 cup of clear juice (apple or cranberry) and recheck blood sugar 15 minutes after drinking juice. If blood sugar continues to be 70 or below, call the Short Stay department and ask to speak to a nurse. Jacque voiced understanding.   Shower instructions given.

## 2022-09-03 NOTE — Anesthesia Preprocedure Evaluation (Signed)
Anesthesia Evaluation  Patient identified by MRN, date of birth, ID band Patient awake    Reviewed: Allergy & Precautions, NPO status , Patient's Chart, lab work & pertinent test results, reviewed documented beta blocker date and time   History of Anesthesia Complications Negative for: history of anesthetic complications  Airway Mallampati: II  TM Distance: >3 FB Neck ROM: Full    Dental  (+) Partial Lower, Edentulous Upper,    Pulmonary sleep apnea , COPD, Current Smoker and Patient abstained from smoking.   Pulmonary exam normal        Cardiovascular hypertension, Pt. on medications and Pt. on home beta blockers + Peripheral Vascular Disease  Normal cardiovascular exam  TTE 09/01/22: EF 45-50%, grade I DD, valves ok     Neuro/Psych CVA    GI/Hepatic   Endo/Other  diabetes, Type 2, Insulin Dependent    Renal/GU      Musculoskeletal   Abdominal   Peds  Hematology   Anesthesia Other Findings   Reproductive/Obstetrics                              Anesthesia Physical Anesthesia Plan  ASA: 3  Anesthesia Plan: General   Post-op Pain Management: Tylenol PO (pre-op)*   Induction: Intravenous and Rapid sequence  PONV Risk Score and Plan: 1 and Treatment may vary due to age or medical condition, Ondansetron, Dexamethasone and Midazolam  Airway Management Planned: Oral ETT  Additional Equipment: Arterial line  Intra-op Plan:   Post-operative Plan: Extubation in OR  Informed Consent: I have reviewed the patients History and Physical, chart, labs and discussed the procedure including the risks, benefits and alternatives for the proposed anesthesia with the patient or authorized representative who has indicated his/her understanding and acceptance.     Dental advisory given  Plan Discussed with: CRNA  Anesthesia Plan Comments: (PAT note by Antionette Poles, PA-C: 72 y.o. male with  medical history significant of current smoker with associated COPD, alcohol usage, neuropathy, IDDM2 (A1c 7.0 on 08/21/22), PAD s/p right AKA. Recent admission 6/19-09/02/22 for Right eye visual loss due to left occipital and cerebral ischemic infarcts. CTA showed left PCA P3 or  P4 branch occlusion, severe atherosclerosis in the neck and at thoracic inlet, high-grade stenosis at both ICA bulbs approaching radiographic string sign. Vascular surgery consulted and recommended TCAR. He was discharged on DAPT.  Echo ordered during admission showed mildly reduced EF 45-50%, grade 1 dd, normal RV function, no significant valvular abnormalities.   Pt on once weekly GLP1-agonist, last dose 08/31/22.   CBC 08/31/22 reviewed, unremarkable. CMP 08/31/22 reviewed, mild hyponatremia Sodium 134, glucose elevated 454, otherwise unremarkable.   Pt will need DOS evaluation.   EKG 09/02/22: Sinus rhythm. Rate 97. PACs in couplets. LVH with IVCD, LAD, and secondary repol abnormality.   CTA head/neck 09/01/22: IMPRESSION: 1. Negative for large vessel occlusion but Positive for Left PCA P3 or P4 branch occlusion. Left MCA is within normal limits.   2. Positive also for Severe atherosclerosis in the neck and at the thoracic inlet. Aortic Atherosclerosis (ICD10-I70.0), and high-grade stenoses: - 70% estimated stenosis at both ICA bulbs, both approaching a RADIOGRAPHIC STRING SIGN. Recommend follow-up with Vascular Surgery. 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 - Moderate to Severe stenosis of the Dominant Right Vertebral Artery origin.   3. Mild progression of Left hemisphere cytotoxic edema since the CT yesterday. No malignant hemorrhagic transformation or intracranial mass  effect.  TTE 09/01/22:  1. Left ventricular ejection fraction, by estimation, is 45 to 50%. The  left ventricle has mildly decreased function. Left ventricular endocardial  border not optimally defined to evaluate  regional wall motion. Left  ventricular diastolic parameters are  consistent with Grade I diastolic dysfunction (impaired relaxation).   2. Right ventricular systolic function is normal. The right ventricular  size is normal. Tricuspid regurgitation signal is inadequate for assessing  PA pressure.   3. The mitral valve was not well visualized. No evidence of mitral valve  regurgitation. No evidence of mitral stenosis.   4. The aortic valve was not well visualized. Aortic valve regurgitation  is not visualized. No aortic stenosis is present.   5. The inferior vena cava is normal in size with greater than 50%  respiratory variability, suggesting right atrial pressure of 3 mmHg.   Comparison(s): No prior Echocardiogram.     )         Anesthesia Quick Evaluation

## 2022-09-03 NOTE — Progress Notes (Signed)
Anesthesia Chart Review: Same day workup  72 y.o. male with medical history significant of current smoker with associated COPD, alcohol usage, neuropathy, IDDM2 (A1c 7.0 on 08/21/22), PAD s/p right AKA. Recent admission 6/19-09/02/22 for Right eye visual loss due to left occipital and cerebral ischemic infarcts. CTA showed left PCA P3 or  P4 branch occlusion, severe atherosclerosis in the neck and at thoracic inlet, high-grade stenosis at both ICA bulbs approaching radiographic string sign. Vascular surgery consulted and recommended TCAR. He was discharged on DAPT.  Echo ordered during admission showed mildly reduced EF 45-50%, grade 1 dd, normal RV function, no significant valvular abnormalities.   Pt on once weekly GLP1-agonist, last dose 08/31/22.   CBC 08/31/22 reviewed, unremarkable. CMP 08/31/22 reviewed, mild hyponatremia Sodium 134, glucose elevated 454, otherwise unremarkable.   Pt will need DOS evaluation.   EKG 09/02/22: Sinus rhythm. Rate 97. PACs in couplets. LVH with IVCD, LAD, and secondary repol abnormality.   CTA head/neck 09/01/22: IMPRESSION: 1. Negative for large vessel occlusion but Positive for Left PCA P3 or P4 branch occlusion. Left MCA is within normal limits.   2. Positive also for Severe atherosclerosis in the neck and at the thoracic inlet. Aortic Atherosclerosis (ICD10-I70.0), and high-grade stenoses: - 70% estimated stenosis at both ICA bulbs, both approaching a RADIOGRAPHIC STRING SIGN. Recommend follow-up with Vascular Surgery. 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 - Moderate to Severe stenosis of the Dominant Right Vertebral Artery origin.   3. Mild progression of Left hemisphere cytotoxic edema since the CT yesterday. No malignant hemorrhagic transformation or intracranial mass effect.  TTE 09/01/22:  1. Left ventricular ejection fraction, by estimation, is 45 to 50%. The  left ventricle has mildly decreased function. Left  ventricular endocardial  border not optimally defined to evaluate regional wall motion. Left  ventricular diastolic parameters are  consistent with Grade I diastolic dysfunction (impaired relaxation).   2. Right ventricular systolic function is normal. The right ventricular  size is normal. Tricuspid regurgitation signal is inadequate for assessing  PA pressure.   3. The mitral valve was not well visualized. No evidence of mitral valve  regurgitation. No evidence of mitral stenosis.   4. The aortic valve was not well visualized. Aortic valve regurgitation  is not visualized. No aortic stenosis is present.   5. The inferior vena cava is normal in size with greater than 50%  respiratory variability, suggesting right atrial pressure of 3 mmHg.   Comparison(s): No prior Echocardiogram.    Zannie Cove [facility] Short Stay Center/Anesthesiology Phone [phone] 09/03/2022 3:10 PM

## 2022-09-04 ENCOUNTER — Encounter (HOSPITAL_COMMUNITY)
Admission: RE | Disposition: A | Discharge status: Home / Self Care | Payer: Self-pay | Source: Home / Self Care | Attending: Surgery

## 2022-09-04 ENCOUNTER — Other Ambulatory Visit: Payer: Self-pay

## 2022-09-04 ENCOUNTER — Inpatient Hospital Stay (HOSPITAL_COMMUNITY): Payer: Medicare (Managed Care) | Admitting: Physician Assistant

## 2022-09-04 ENCOUNTER — Inpatient Hospital Stay (HOSPITAL_COMMUNITY): Payer: Medicare (Managed Care)

## 2022-09-04 ENCOUNTER — Encounter (HOSPITAL_COMMUNITY): Payer: Self-pay | Admitting: Surgery

## 2022-09-04 ENCOUNTER — Inpatient Hospital Stay (HOSPITAL_COMMUNITY)
Admission: RE | Admit: 2022-09-04 | Discharge: 2022-09-06 | DRG: 035 | Disposition: A | Discharge status: Home / Self Care | Payer: Medicare (Managed Care) | Attending: Surgery | Admitting: Surgery

## 2022-09-04 DIAGNOSIS — Z794 Long term (current) use of insulin: Secondary | ICD-10-CM

## 2022-09-04 DIAGNOSIS — Z8489 Family history of other specified conditions: Secondary | ICD-10-CM

## 2022-09-04 DIAGNOSIS — I6529 Occlusion and stenosis of unspecified carotid artery: Secondary | ICD-10-CM | POA: Diagnosis present

## 2022-09-04 DIAGNOSIS — I6522 Occlusion and stenosis of left carotid artery: Principal | ICD-10-CM | POA: Diagnosis present

## 2022-09-04 DIAGNOSIS — I10 Essential (primary) hypertension: Secondary | ICD-10-CM | POA: Diagnosis present

## 2022-09-04 DIAGNOSIS — I2510 Atherosclerotic heart disease of native coronary artery without angina pectoris: Secondary | ICD-10-CM | POA: Diagnosis present

## 2022-09-04 DIAGNOSIS — G4730 Sleep apnea, unspecified: Secondary | ICD-10-CM | POA: Diagnosis present

## 2022-09-04 DIAGNOSIS — Z8673 Personal history of transient ischemic attack (TIA), and cerebral infarction without residual deficits: Secondary | ICD-10-CM

## 2022-09-04 DIAGNOSIS — Z888 Allergy status to other drugs, medicaments and biological substances status: Secondary | ICD-10-CM

## 2022-09-04 DIAGNOSIS — E1140 Type 2 diabetes mellitus with diabetic neuropathy, unspecified: Secondary | ICD-10-CM | POA: Diagnosis present

## 2022-09-04 DIAGNOSIS — E1151 Type 2 diabetes mellitus with diabetic peripheral angiopathy without gangrene: Secondary | ICD-10-CM | POA: Diagnosis present

## 2022-09-04 DIAGNOSIS — F1010 Alcohol abuse, uncomplicated: Secondary | ICD-10-CM | POA: Diagnosis present

## 2022-09-04 DIAGNOSIS — E785 Hyperlipidemia, unspecified: Secondary | ICD-10-CM | POA: Diagnosis present

## 2022-09-04 DIAGNOSIS — H5461 Unqualified visual loss, right eye, normal vision left eye: Secondary | ICD-10-CM | POA: Diagnosis present

## 2022-09-04 DIAGNOSIS — E871 Hypo-osmolality and hyponatremia: Secondary | ICD-10-CM | POA: Diagnosis present

## 2022-09-04 DIAGNOSIS — Z89611 Acquired absence of right leg above knee: Secondary | ICD-10-CM

## 2022-09-04 DIAGNOSIS — F17210 Nicotine dependence, cigarettes, uncomplicated: Secondary | ICD-10-CM | POA: Diagnosis not present

## 2022-09-04 DIAGNOSIS — Z993 Dependence on wheelchair: Secondary | ICD-10-CM

## 2022-09-04 DIAGNOSIS — J449 Chronic obstructive pulmonary disease, unspecified: Secondary | ICD-10-CM | POA: Diagnosis present

## 2022-09-04 HISTORY — PX: ULTRASOUND GUIDANCE FOR VASCULAR ACCESS: SHX6516

## 2022-09-04 HISTORY — DX: Cerebral infarction, unspecified: I63.9

## 2022-09-04 HISTORY — DX: Sleep apnea, unspecified: G47.30

## 2022-09-04 HISTORY — PX: TRANSCAROTID ARTERY REVASCULARIZATIONÂ: SHX6778

## 2022-09-04 LAB — TYPE AND SCREEN
ABO/RH(D): A NEG
Antibody Screen: NEGATIVE

## 2022-09-04 LAB — CBC
HCT: 41 % (ref 39.0–52.0)
Hemoglobin: 13.5 g/dL (ref 13.0–17.0)
MCH: 28.4 pg (ref 26.0–34.0)
MCHC: 32.9 g/dL (ref 30.0–36.0)
MCV: 86.3 fL (ref 80.0–100.0)
Platelets: 224 10*3/uL (ref 150–400)
RBC: 4.75 MIL/uL (ref 4.22–5.81)
RDW: 14.6 % (ref 11.5–15.5)
WBC: 6.8 10*3/uL (ref 4.0–10.5)
nRBC: 0 % (ref 0.0–0.2)

## 2022-09-04 LAB — CREATININE, SERUM
Creatinine, Ser: 0.62 mg/dL (ref 0.61–1.24)
GFR, Estimated: >60 mL/min (ref >60)

## 2022-09-04 LAB — SURGICAL PCR SCREEN
MRSA, PCR: NEGATIVE
Staphylococcus aureus: NEGATIVE

## 2022-09-04 LAB — ABO/RH: ABO/RH(D): A NEG

## 2022-09-04 LAB — GLUCOSE, CAPILLARY
Glucose-Capillary: 310 mg/dL — ABNORMAL HIGH (ref 70–99)
Glucose-Capillary: 224 mg/dL — ABNORMAL HIGH (ref 70–99)
Glucose-Capillary: 200 mg/dL — ABNORMAL HIGH (ref 70–99)
Glucose-Capillary: 160 mg/dL — ABNORMAL HIGH (ref 70–99)

## 2022-09-04 SURGERY — TRANSCAROTID ARTERY REVASCULARIZATION (TCAR)
Anesthesia: General | Site: Neck | Laterality: Right

## 2022-09-04 MED ORDER — EPHEDRINE 5 MG/ML INJ
INTRAVENOUS | Status: AC
  Filled 2022-09-04: qty 5

## 2022-09-04 MED ORDER — VITAMIN B-12 100 MCG PO TABS
100.0000 ug | ORAL_TABLET | Freq: Every day | ORAL | Status: DC
  Administered 2022-09-05 – 2022-09-06 (×2): 100 ug via ORAL
  Filled 2022-09-04 (×2): qty 1

## 2022-09-04 MED ORDER — SENNOSIDES-DOCUSATE SODIUM 8.6-50 MG PO TABS: 1.0000 | ORAL_TABLET | Freq: Every evening | ORAL | Status: DC | PRN

## 2022-09-04 MED ORDER — ONDANSETRON HCL 4 MG/2ML IJ SOLN: 4.0000 mg | Freq: Four times a day (QID) | INTRAMUSCULAR | Status: DC | PRN

## 2022-09-04 MED ORDER — PANTOPRAZOLE SODIUM 40 MG PO TBEC
40.0000 mg | DELAYED_RELEASE_TABLET | Freq: Every day | ORAL | Status: DC
  Administered 2022-09-04 – 2022-09-06 (×3): 40 mg via ORAL
  Filled 2022-09-04 (×3): qty 1

## 2022-09-04 MED ORDER — HYDROMORPHONE HCL 1 MG/ML IJ SOLN: 0.5000 mg | INTRAMUSCULAR | Status: DC | PRN

## 2022-09-04 MED ORDER — HEPARIN 6000 UNIT IRRIGATION SOLUTION
Status: DC | PRN
  Administered 2022-09-04: 1

## 2022-09-04 MED ORDER — SUGAMMADEX SODIUM 200 MG/2ML IV SOLN
INTRAVENOUS | Status: DC | PRN
  Administered 2022-09-04: 200 mg via INTRAVENOUS

## 2022-09-04 MED ORDER — DOPAMINE-DEXTROSE 3.2-5 MG/ML-% IV SOLN
INTRAVENOUS | Status: AC
  Administered 2022-09-04: 2 ug/kg/min
  Filled 2022-09-04: qty 250

## 2022-09-04 MED ORDER — PROPOFOL 10 MG/ML IV BOLUS
INTRAVENOUS | Status: DC | PRN
  Administered 2022-09-04: 110 mg via INTRAVENOUS
  Administered 2022-09-04: 100 ug/kg/min via INTRAVENOUS

## 2022-09-04 MED ORDER — ROCURONIUM BROMIDE 10 MG/ML (PF) SYRINGE
PREFILLED_SYRINGE | INTRAVENOUS | Status: DC | PRN
  Administered 2022-09-04: 60 mg via INTRAVENOUS
  Administered 2022-09-04: 20 mg via INTRAVENOUS

## 2022-09-04 MED ORDER — PROMETHAZINE HCL 25 MG PO TABS: 25.0000 mg | ORAL_TABLET | Freq: Three times a day (TID) | ORAL | Status: DC | PRN

## 2022-09-04 MED ORDER — INSULIN ASPART 100 UNIT/ML IJ SOLN
0.0000 [IU] | INTRAMUSCULAR | Status: DC | PRN
  Administered 2022-09-04: 5 [IU] via SUBCUTANEOUS

## 2022-09-04 MED ORDER — FLUTICASONE PROPIONATE 50 MCG/ACT NA SUSP: 2.0000 | Freq: Every day | NASAL | Status: DC | PRN

## 2022-09-04 MED ORDER — PHENYLEPHRINE HCL-NACL 20-0.9 MG/250ML-% IV SOLN
INTRAVENOUS | Status: DC | PRN
  Administered 2022-09-04: 80 ug/min via INTRAVENOUS

## 2022-09-04 MED ORDER — METOPROLOL TARTRATE 5 MG/5ML IV SOLN: 2.0000 mg | INTRAVENOUS | Status: DC | PRN

## 2022-09-04 MED ORDER — ASPIRIN 81 MG PO CHEW: 81.0000 mg | CHEWABLE_TABLET | Freq: Every day | ORAL | Status: DC

## 2022-09-04 MED ORDER — FUROSEMIDE 20 MG PO TABS: 20.0000 mg | ORAL_TABLET | Freq: Every day | ORAL | Status: DC | PRN

## 2022-09-04 MED ORDER — ACETAMINOPHEN 325 MG PO TABS: 325.0000 mg | ORAL_TABLET | ORAL | Status: DC | PRN

## 2022-09-04 MED ORDER — SODIUM CHLORIDE 0.9 % IV BOLUS
500.0000 mL | Freq: Once | INTRAVENOUS | Status: AC
  Administered 2022-09-04: 500 mL via INTRAVENOUS

## 2022-09-04 MED ORDER — ACETAMINOPHEN 650 MG RE SUPP: 325.0000 mg | RECTAL | Status: DC | PRN

## 2022-09-04 MED ORDER — ORAL CARE MOUTH RINSE: 15.0000 mL | Freq: Once | OROMUCOSAL | Status: AC

## 2022-09-04 MED ORDER — HEMOSTATIC AGENTS (NO CHARGE) OPTIME
TOPICAL | Status: DC | PRN
  Administered 2022-09-04: 1 via TOPICAL

## 2022-09-04 MED ORDER — LABETALOL HCL 5 MG/ML IV SOLN: 10.0000 mg | INTRAVENOUS | Status: DC | PRN

## 2022-09-04 MED ORDER — CEFAZOLIN SODIUM-DEXTROSE 2-4 GM/100ML-% IV SOLN
2.0000 g | Freq: Three times a day (TID) | INTRAVENOUS | Status: AC
  Administered 2022-09-04 – 2022-09-05 (×2): 2 g via INTRAVENOUS
  Filled 2022-09-04 (×2): qty 100

## 2022-09-04 MED ORDER — PHENYLEPHRINE 80 MCG/ML (10ML) SYRINGE FOR IV PUSH (FOR BLOOD PRESSURE SUPPORT)
PREFILLED_SYRINGE | INTRAVENOUS | Status: AC
  Filled 2022-09-04: qty 10

## 2022-09-04 MED ORDER — FENTANYL CITRATE (PF) 250 MCG/5ML IJ SOLN
INTRAMUSCULAR | Status: DC | PRN
  Administered 2022-09-04: 100 ug via INTRAVENOUS

## 2022-09-04 MED ORDER — HEPARIN 6000 UNIT IRRIGATION SOLUTION
Status: AC
  Filled 2022-09-04: qty 500

## 2022-09-04 MED ORDER — LACTATED RINGERS IV SOLN: INTRAVENOUS | Status: DC | PRN

## 2022-09-04 MED ORDER — NICOTINE 21 MG/24HR TD PT24
21.0000 mg | MEDICATED_PATCH | Freq: Every day | TRANSDERMAL | Status: DC
  Filled 2022-09-04 (×3): qty 1

## 2022-09-04 MED ORDER — 0.9 % SODIUM CHLORIDE (POUR BTL) OPTIME
TOPICAL | Status: DC | PRN
  Administered 2022-09-04: 1000 mL

## 2022-09-04 MED ORDER — ONDANSETRON 4 MG PO TBDP: 4.0000 mg | ORAL_TABLET | Freq: Three times a day (TID) | ORAL | Status: DC | PRN

## 2022-09-04 MED ORDER — HYDROCHLOROTHIAZIDE 12.5 MG PO TABS
12.5000 mg | ORAL_TABLET | Freq: Every day | ORAL | Status: DC
  Administered 2022-09-05 – 2022-09-06 (×2): 12.5 mg via ORAL
  Filled 2022-09-04 (×2): qty 1

## 2022-09-04 MED ORDER — HYDRALAZINE HCL 20 MG/ML IJ SOLN: 5.0000 mg | INTRAMUSCULAR | Status: DC | PRN

## 2022-09-04 MED ORDER — SODIUM CHLORIDE 0.9 % IV SOLN: INTRAVENOUS | Status: DC

## 2022-09-04 MED ORDER — CHLORHEXIDINE GLUCONATE 0.12 % MT SOLN
15.0000 mL | Freq: Once | OROMUCOSAL | Status: AC
  Administered 2022-09-04: 15 mL via OROMUCOSAL
  Filled 2022-09-04: qty 15

## 2022-09-04 MED ORDER — METOPROLOL SUCCINATE ER 25 MG PO TB24
25.0000 mg | ORAL_TABLET | Freq: Every day | ORAL | Status: DC
  Administered 2022-09-05: 25 mg via ORAL
  Filled 2022-09-04: qty 1

## 2022-09-04 MED ORDER — INSULIN ASPART 100 UNIT/ML IJ SOLN
0.0000 [IU] | Freq: Three times a day (TID) | INTRAMUSCULAR | Status: DC
  Administered 2022-09-05: 2 [IU] via SUBCUTANEOUS
  Administered 2022-09-05 (×2): 5 [IU] via SUBCUTANEOUS
  Administered 2022-09-06: 3 [IU] via SUBCUTANEOUS

## 2022-09-04 MED ORDER — DOPAMINE-DEXTROSE 3.2-5 MG/ML-% IV SOLN
3.0000 ug/kg/min | INTRAVENOUS | Status: DC
  Filled 2022-09-04: qty 250

## 2022-09-04 MED ORDER — ACETAMINOPHEN 500 MG PO TABS
1000.0000 mg | ORAL_TABLET | Freq: Once | ORAL | Status: AC
  Administered 2022-09-04: 1000 mg via ORAL
  Filled 2022-09-04: qty 2

## 2022-09-04 MED ORDER — PHENOL 1.4 % MT LIQD: 1.0000 | OROMUCOSAL | Status: DC | PRN

## 2022-09-04 MED ORDER — ALUM & MAG HYDROXIDE-SIMETH 200-200-20 MG/5ML PO SUSP: 15.0000 mL | ORAL | Status: DC | PRN

## 2022-09-04 MED ORDER — MAGNESIUM SULFATE 2 GM/50ML IV SOLN: 2.0000 g | Freq: Every day | INTRAVENOUS | Status: DC | PRN

## 2022-09-04 MED ORDER — ACETAMINOPHEN 500 MG PO TABS: 1000.0000 mg | ORAL_TABLET | Freq: Four times a day (QID) | ORAL | Status: DC | PRN

## 2022-09-04 MED ORDER — CLOPIDOGREL BISULFATE 75 MG PO TABS
75.0000 mg | ORAL_TABLET | Freq: Every day | ORAL | Status: DC
  Administered 2022-09-04 – 2022-09-06 (×3): 75 mg via ORAL
  Filled 2022-09-04 (×3): qty 1

## 2022-09-04 MED ORDER — LEFLUNOMIDE 10 MG PO TABS
20.0000 mg | ORAL_TABLET | Freq: Every day | ORAL | Status: DC
  Administered 2022-09-05 – 2022-09-06 (×2): 20 mg via ORAL
  Filled 2022-09-04 (×2): qty 2

## 2022-09-04 MED ORDER — EPHEDRINE SULFATE-NACL 50-0.9 MG/10ML-% IV SOSY
PREFILLED_SYRINGE | INTRAVENOUS | Status: DC | PRN
  Administered 2022-09-04: 10 mg via INTRAVENOUS

## 2022-09-04 MED ORDER — PROTAMINE SULFATE 10 MG/ML IV SOLN
INTRAVENOUS | Status: DC | PRN
  Administered 2022-09-04: 50 mg via INTRAVENOUS

## 2022-09-04 MED ORDER — OXYCODONE HCL 5 MG PO TABS: 5.0000 mg | ORAL_TABLET | ORAL | Status: DC | PRN

## 2022-09-04 MED ORDER — BISACODYL 5 MG PO TBEC: 5.0000 mg | DELAYED_RELEASE_TABLET | Freq: Every day | ORAL | Status: DC | PRN

## 2022-09-04 MED ORDER — PROMETHAZINE HCL 25 MG/ML IJ SOLN: 12.5000 mg | Freq: Once | INTRAMUSCULAR | Status: DC | PRN

## 2022-09-04 MED ORDER — FENTANYL CITRATE (PF) 100 MCG/2ML IJ SOLN
INTRAMUSCULAR | Status: AC
  Administered 2022-09-04: 25 ug via INTRAVENOUS
  Filled 2022-09-04: qty 2

## 2022-09-04 MED ORDER — MORPHINE SULFATE ER 100 MG PO TBCR: 100.0000 mg | EXTENDED_RELEASE_TABLET | Freq: Two times a day (BID) | ORAL | Status: DC

## 2022-09-04 MED ORDER — IODIXANOL 320 MG/ML IV SOLN
INTRAVENOUS | Status: DC | PRN
  Administered 2022-09-04: 35 mL via INTRA_ARTERIAL

## 2022-09-04 MED ORDER — DOCUSATE SODIUM 100 MG PO CAPS
100.0000 mg | ORAL_CAPSULE | Freq: Every day | ORAL | Status: DC
  Administered 2022-09-05: 100 mg via ORAL
  Filled 2022-09-04 (×2): qty 1

## 2022-09-04 MED ORDER — AMISULPRIDE (ANTIEMETIC) 5 MG/2ML IV SOLN: 10.0000 mg | Freq: Once | INTRAVENOUS | Status: DC | PRN

## 2022-09-04 MED ORDER — VITAMIN D 25 MCG (1000 UNIT) PO TABS
1000.0000 [IU] | ORAL_TABLET | Freq: Every day | ORAL | Status: DC
  Administered 2022-09-04 – 2022-09-06 (×3): 1000 [IU] via ORAL
  Filled 2022-09-04 (×3): qty 1

## 2022-09-04 MED ORDER — FENTANYL CITRATE (PF) 100 MCG/2ML IJ SOLN
25.0000 ug | INTRAMUSCULAR | Status: DC | PRN
  Administered 2022-09-04: 25 ug via INTRAVENOUS

## 2022-09-04 MED ORDER — INSULIN ASPART 100 UNIT/ML IJ SOLN
INTRAMUSCULAR | Status: AC
  Administered 2022-09-04: 3 [IU] via SUBCUTANEOUS
  Filled 2022-09-04: qty 1

## 2022-09-04 MED ORDER — HEPARIN SODIUM (PORCINE) 1000 UNIT/ML IJ SOLN
INTRAMUSCULAR | Status: AC
  Filled 2022-09-04: qty 10

## 2022-09-04 MED ORDER — PHENYLEPHRINE 80 MCG/ML (10ML) SYRINGE FOR IV PUSH (FOR BLOOD PRESSURE SUPPORT)
PREFILLED_SYRINGE | INTRAVENOUS | Status: DC | PRN
  Administered 2022-09-04: 80 ug via INTRAVENOUS
  Administered 2022-09-04: 40 ug via INTRAVENOUS
  Administered 2022-09-04 (×5): 160 ug via INTRAVENOUS

## 2022-09-04 MED ORDER — ETANERCEPT 50 MG/ML ~~LOC~~ SOSY: 50.0000 mg | PREFILLED_SYRINGE | SUBCUTANEOUS | Status: DC

## 2022-09-04 MED ORDER — PROTAMINE SULFATE 10 MG/ML IV SOLN
INTRAVENOUS | Status: AC
  Filled 2022-09-04: qty 5

## 2022-09-04 MED ORDER — HEPARIN SODIUM (PORCINE) 1000 UNIT/ML IJ SOLN
INTRAMUSCULAR | Status: DC | PRN
  Administered 2022-09-04: 8000 [IU] via INTRAVENOUS

## 2022-09-04 MED ORDER — CHLORHEXIDINE GLUCONATE CLOTH 2 % EX PADS: 6.0000 | MEDICATED_PAD | Freq: Once | CUTANEOUS | Status: DC

## 2022-09-04 MED ORDER — HEPARIN SODIUM (PORCINE) 5000 UNIT/ML IJ SOLN
5000.0000 [IU] | Freq: Three times a day (TID) | INTRAMUSCULAR | Status: DC
  Administered 2022-09-04 – 2022-09-06 (×5): 5000 [IU] via SUBCUTANEOUS
  Filled 2022-09-04 (×5): qty 1

## 2022-09-04 MED ORDER — FENTANYL CITRATE (PF) 250 MCG/5ML IJ SOLN
INTRAMUSCULAR | Status: AC
  Filled 2022-09-04: qty 5

## 2022-09-04 MED ORDER — POTASSIUM CHLORIDE CRYS ER 20 MEQ PO TBCR
20.0000 meq | EXTENDED_RELEASE_TABLET | Freq: Every day | ORAL | Status: AC | PRN
  Administered 2022-09-05: 40 meq via ORAL
  Filled 2022-09-04: qty 2

## 2022-09-04 MED ORDER — LACTATED RINGERS IV SOLN: INTRAVENOUS | Status: DC

## 2022-09-04 MED ORDER — OXYCODONE HCL 5 MG/5ML PO SOLN: 5.0000 mg | Freq: Once | ORAL | Status: DC | PRN

## 2022-09-04 MED ORDER — GUAIFENESIN-DM 100-10 MG/5ML PO SYRP: 15.0000 mL | ORAL_SOLUTION | ORAL | Status: DC | PRN

## 2022-09-04 MED ORDER — MORPHINE SULFATE ER 100 MG PO TBCR
100.0000 mg | EXTENDED_RELEASE_TABLET | Freq: Two times a day (BID) | ORAL | Status: DC
  Administered 2022-09-04 – 2022-09-06 (×4): 100 mg via ORAL
  Filled 2022-09-04 (×4): qty 1

## 2022-09-04 MED ORDER — SODIUM CHLORIDE 0.9 % IV SOLN
INTRAVENOUS | Status: DC
  Administered 2022-09-04: 125 mL via INTRAVENOUS

## 2022-09-04 MED ORDER — GLYCOPYRROLATE PF 0.2 MG/ML IJ SOSY
PREFILLED_SYRINGE | INTRAMUSCULAR | Status: DC | PRN
  Administered 2022-09-04 (×2): .1 mg via INTRAVENOUS

## 2022-09-04 MED ORDER — OXYCODONE HCL 5 MG PO TABS: 5.0000 mg | ORAL_TABLET | Freq: Once | ORAL | Status: DC | PRN

## 2022-09-04 MED ORDER — GLYCOPYRROLATE PF 0.2 MG/ML IJ SOSY
PREFILLED_SYRINGE | INTRAMUSCULAR | Status: AC
  Filled 2022-09-04: qty 1

## 2022-09-04 MED ORDER — PROPOFOL 10 MG/ML IV BOLUS
INTRAVENOUS | Status: AC
  Filled 2022-09-04: qty 20

## 2022-09-04 MED ORDER — LIDOCAINE 2% (20 MG/ML) 5 ML SYRINGE
INTRAMUSCULAR | Status: DC | PRN
  Administered 2022-09-04: 100 mg via INTRAVENOUS

## 2022-09-04 MED ORDER — ATORVASTATIN CALCIUM 80 MG PO TABS
80.0000 mg | ORAL_TABLET | Freq: Every day | ORAL | Status: DC
  Administered 2022-09-04 – 2022-09-05 (×2): 80 mg via ORAL
  Filled 2022-09-04 (×2): qty 1

## 2022-09-04 MED ORDER — CEFAZOLIN SODIUM-DEXTROSE 2-4 GM/100ML-% IV SOLN
2.0000 g | INTRAVENOUS | Status: AC
  Administered 2022-09-04: 2 g via INTRAVENOUS
  Filled 2022-09-04: qty 100

## 2022-09-04 MED ORDER — PREDNISONE 1 MG PO TABS
2.0000 mg | ORAL_TABLET | Freq: Every day | ORAL | Status: DC
  Administered 2022-09-05 – 2022-09-06 (×2): 2 mg via ORAL
  Filled 2022-09-04 (×2): qty 2

## 2022-09-04 MED ORDER — PREGABALIN 25 MG PO CAPS
150.0000 mg | ORAL_CAPSULE | Freq: Two times a day (BID) | ORAL | Status: DC
  Administered 2022-09-04 – 2022-09-06 (×4): 150 mg via ORAL
  Filled 2022-09-04 (×4): qty 2

## 2022-09-04 MED ORDER — CALCIUM CARBONATE 1250 (500 CA) MG PO TABS
1250.0000 mg | ORAL_TABLET | Freq: Every day | ORAL | Status: DC
  Administered 2022-09-05 – 2022-09-06 (×2): 1250 mg via ORAL
  Filled 2022-09-04 (×2): qty 1

## 2022-09-04 MED ORDER — ONDANSETRON HCL 4 MG/2ML IJ SOLN
INTRAMUSCULAR | Status: DC | PRN
  Administered 2022-09-04: 4 mg via INTRAVENOUS

## 2022-09-04 MED ORDER — ASPIRIN 81 MG PO TBEC
81.0000 mg | DELAYED_RELEASE_TABLET | Freq: Every day | ORAL | Status: DC
  Administered 2022-09-05 – 2022-09-06 (×2): 81 mg via ORAL
  Filled 2022-09-04 (×2): qty 1

## 2022-09-04 MED ORDER — SODIUM CHLORIDE 0.9 % IV SOLN
500.0000 mL | Freq: Once | INTRAVENOUS | Status: AC | PRN
  Administered 2022-09-04 (×2): 500 mL via INTRAVENOUS

## 2022-09-04 MED ORDER — CALCIUM CARBONATE 1500 (600 CA) MG PO TABS: 600.0000 mg | ORAL_TABLET | Freq: Every day | ORAL | Status: DC

## 2022-09-04 SURGICAL SUPPLY — 53 items
ADH SKN CLS APL DERMABOND .7 (GAUZE/BANDAGES/DRESSINGS) ×4
AGENT HMST KT MTR STRL THRMB (HEMOSTASIS) ×2
BAG BANDED W/RUBBER/TAPE 36X54 (MISCELLANEOUS) ×2 IMPLANT
BAG COUNTER SPONGE SURGICOUNT (BAG) ×2 IMPLANT
BAG EQP BAND 135X91 W/RBR TAPE (MISCELLANEOUS) ×2
BAG SPNG CNTER NS LX DISP (BAG) ×2
BALLN STERLING RX 5X40X80 (BALLOONS) ×2
BALLOON STERLING RX 5X40X80 (BALLOONS) IMPLANT
CANISTER SUCT 3000ML PPV (MISCELLANEOUS) ×2 IMPLANT
CATH SUCT 10FR WHISTLE TIP (CATHETERS) ×2 IMPLANT
CLIP TI MEDIUM 6 (CLIP) ×2 IMPLANT
CLIP TI WIDE RED SMALL 6 (CLIP) ×2 IMPLANT
COVER DOME SNAP 22 D (MISCELLANEOUS) ×2 IMPLANT
COVER PROBE W GEL 5X96 (DRAPES) ×2 IMPLANT
DERMABOND ADVANCED .7 DNX12 (GAUZE/BANDAGES/DRESSINGS) ×2 IMPLANT
DRAPE FEMORAL ANGIO 80X135IN (DRAPES) ×2 IMPLANT
ELECT REM PT RETURN 9FT ADLT (ELECTROSURGICAL) ×2
ELECTRODE REM PT RTRN 9FT ADLT (ELECTROSURGICAL) ×2 IMPLANT
GAUZE KITTNER 1.5X5 (MISCELLANEOUS) IMPLANT
GLOVE SURG SS PI 7.5 STRL IVOR (GLOVE) ×6 IMPLANT
GOWN STRL REUS W/ TWL LRG LVL3 (GOWN DISPOSABLE) ×4 IMPLANT
GOWN STRL REUS W/ TWL XL LVL3 (GOWN DISPOSABLE) ×2 IMPLANT
GOWN STRL REUS W/TWL LRG LVL3 (GOWN DISPOSABLE) ×4
GOWN STRL REUS W/TWL XL LVL3 (GOWN DISPOSABLE) ×2
GUIDEWIRE ENROUTE 0.014 (WIRE) ×2 IMPLANT
HEMOSTAT SNOW SURGICEL 2X4 (HEMOSTASIS) IMPLANT
INTRODUCER KIT GALT 7CM (INTRODUCER) ×2
KIT BASIN OR (CUSTOM PROCEDURE TRAY) ×2 IMPLANT
KIT ENCORE 26 ADVANTAGE (KITS) ×2 IMPLANT
KIT INTRODUCER GALT 7 (INTRODUCER) ×2 IMPLANT
KIT TURNOVER KIT B (KITS) ×2 IMPLANT
NDL HYPO 25GX1X1/2 BEV (NEEDLE) IMPLANT
NEEDLE HYPO 25GX1X1/2 BEV (NEEDLE) IMPLANT
PACK CAROTID (CUSTOM PROCEDURE TRAY) ×2 IMPLANT
POSITIONER HEAD DONUT 9IN (MISCELLANEOUS) ×2 IMPLANT
SET MICROPUNCTURE 5F STIFF (MISCELLANEOUS) ×2 IMPLANT
STENT TRANSCAROTID SYSTEM 8X40 (Permanent Stent) IMPLANT
SURGIFLO W/THROMBIN 8M KIT (HEMOSTASIS) IMPLANT
SUT PROLENE 5 0 C 1 24 (SUTURE) ×4 IMPLANT
SUT SILK 2 0 PERMA HAND 18 BK (SUTURE) ×2 IMPLANT
SUT SILK 2 0 SH (SUTURE) ×2 IMPLANT
SUT VIC AB 3-0 SH 27 (SUTURE) ×4
SUT VIC AB 3-0 SH 27X BRD (SUTURE) ×4 IMPLANT
SUT VIC AB 4-0 PS2 27 (SUTURE) ×2 IMPLANT
SYR 10ML LL (SYRINGE) ×6 IMPLANT
SYR 20ML LL LF (SYRINGE) ×2 IMPLANT
SYR CONTROL 10ML LL (SYRINGE) IMPLANT
SYSTEM TRANSCAROTID NEUROPRTCT (MISCELLANEOUS) ×2 IMPLANT
TOWEL GREEN STERILE (TOWEL DISPOSABLE) ×2 IMPLANT
TRANSCAROTID NEUROPROTECT SYS (MISCELLANEOUS) ×2
WATER STERILE IRR 1000ML POUR (IV SOLUTION) ×2 IMPLANT
WIRE BENTSON .035X145CM (WIRE) ×2 IMPLANT
WIRE TORQFLEX AUST .018X40CM (WIRE) IMPLANT

## 2022-09-04 NOTE — Plan of Care (Signed)
  Problem: Education: Goal: Knowledge of disease or condition will improve Outcome: Progressing Goal: Knowledge of secondary prevention will improve (MUST DOCUMENT ALL) Outcome: Progressing Goal: Knowledge of patient specific risk factors will improve (Mark N/A or DELETE if not current risk factor) Outcome: Progressing   Problem: Ischemic Stroke/TIA Tissue Perfusion: Goal: Complications of ischemic stroke/TIA will be minimized Outcome: Progressing   Problem: Coping: Goal: Will verbalize positive feelings about self Outcome: Progressing Goal: Will identify appropriate support needs Outcome: Progressing   Problem: Health Behavior/Discharge Planning: Goal: Ability to manage health-related needs will improve Outcome: Progressing Goal: Goals will be collaboratively established with patient/family Outcome: Progressing   Problem: Self-Care: Goal: Ability to participate in self-care as condition permits will improve Outcome: Progressing Goal: Verbalization of feelings and concerns over difficulty with self-care will improve Outcome: Progressing Goal: Ability to communicate needs accurately will improve Outcome: Progressing   Problem: Nutrition: Goal: Risk of aspiration will decrease Outcome: Progressing Goal: Dietary intake will improve Outcome: Progressing   Problem: Education: Goal: Ability to describe self-care measures that may prevent or decrease complications (Diabetes Survival Skills Education) will improve Outcome: Progressing Goal: Individualized Educational Video(s) Outcome: Progressing   Problem: Coping: Goal: Ability to adjust to condition or change in health will improve Outcome: Progressing   Problem: Fluid Volume: Goal: Ability to maintain a balanced intake and output will improve Outcome: Progressing   Problem: Health Behavior/Discharge Planning: Goal: Ability to identify and utilize available resources and services will improve Outcome:  Progressing Goal: Ability to manage health-related needs will improve Outcome: Progressing   Problem: Metabolic: Goal: Ability to maintain appropriate glucose levels will improve Outcome: Progressing   Problem: Nutritional: Goal: Maintenance of adequate nutrition will improve Outcome: Progressing Goal: Progress toward achieving an optimal weight will improve Outcome: Progressing   Problem: Skin Integrity: Goal: Risk for impaired skin integrity will decrease Outcome: Progressing   Problem: Tissue Perfusion: Goal: Adequacy of tissue perfusion will improve Outcome: Progressing   

## 2022-09-04 NOTE — Anesthesia Postprocedure Evaluation (Signed)
Anesthesia Post Note  Patient: ABDULKAREEM GOODLY  Procedure(s) Performed: Left Transcarotid Artery Revascularization (Left: Neck) ULTRASOUND GUIDANCE FOR VASCULAR ACCESS, RIGHT FEMORAL VEIN (Right: Groin)     Patient location during evaluation: PACU Anesthesia Type: General Level of consciousness: awake and alert Pain management: pain level controlled Vital Signs Assessment: post-procedure vital signs reviewed and stable Respiratory status: spontaneous breathing, nonlabored ventilation and respiratory function stable Cardiovascular status: blood pressure returned to baseline Postop Assessment: no apparent nausea or vomiting Anesthetic complications: no   There were no known notable events for this encounter.        Shanda Howells

## 2022-09-04 NOTE — Anesthesia Procedure Notes (Signed)
Procedure Name: Intubation Date/Time: 09/04/2022 10:20 AM  Performed by: Sharyn Dross, CRNAPre-anesthesia Checklist: Patient identified, Emergency Drugs available, Suction available and Patient being monitored Patient Re-evaluated:Patient Re-evaluated prior to induction Oxygen Delivery Method: Circle system utilized Preoxygenation: Pre-oxygenation with 100% oxygen Induction Type: IV induction Ventilation: Mask ventilation without difficulty and Oral airway inserted - appropriate to patient size Laryngoscope Size: Mac and 4 Grade View: Grade I Tube type: Oral Tube size: 7.5 mm Number of attempts: 1 Airway Equipment and Method: Stylet and Oral airway Placement Confirmation: ETT inserted through vocal cords under direct vision, positive ETCO2 and breath sounds checked- equal and bilateral Secured at: 22 cm Tube secured with: Tape Dental Injury: Teeth and Oropharynx as per pre-operative assessment

## 2022-09-04 NOTE — Interval H&P Note (Signed)
History and Physical Interval Note:  09/04/2022 8:19 AM  Brian Zavala  has presented today for surgery, with the diagnosis of Symptomatic left carotid artery stenosis.  The various methods of treatment have been discussed with the patient and family. After consideration of risks, benefits and other options for treatment, the patient has consented to  Procedure(s): Transcarotid Artery Revascularization (Left) as a surgical intervention.  The patient's history has been reviewed, patient examined, no change in status, stable for surgery.  I have reviewed the patient's chart and labs.  Questions were answered to the patient's satisfaction.     Durene Cal

## 2022-09-04 NOTE — Anesthesia Procedure Notes (Signed)
Arterial Line Insertion Start/End7/05/2022 9:30 AM Performed by: Laruth Bouchard., CRNA, CRNA  Patient location: Pre-op. Preanesthetic checklist: patient identified, IV checked, site marked, risks and benefits discussed, surgical consent, monitors and equipment checked, pre-op evaluation, timeout performed and anesthesia consent Lidocaine 1% used for infiltration Left, radial was placed Catheter size: 20 G Hand hygiene performed  and maximum sterile barriers used   Attempts: 1 Procedure performed without using ultrasound guided technique. Following insertion, dressing applied and Biopatch. Post procedure assessment: normal  Patient tolerated the procedure well with no immediate complications.

## 2022-09-04 NOTE — Transfer of Care (Signed)
Immediate Anesthesia Transfer of Care Note  Patient: Brian Zavala  Procedure(s) Performed: Left Transcarotid Artery Revascularization (Left: Neck) ULTRASOUND GUIDANCE FOR VASCULAR ACCESS, RIGHT FEMORAL VEIN (Right: Groin)  Patient Location: PACU  Anesthesia Type:General  Level of Consciousness: drowsy and patient cooperative  Airway & Oxygen Therapy: Patient connected to face mask oxygen  Post-op Assessment: Report given to RN, Post -op Vital signs reviewed and stable, and Patient moving all extremities X 4  Post vital signs: Reviewed and stable  Last Vitals:  Vitals Value Taken Time  BP 95/47   Temp    Pulse 85 09/04/22 1223  Resp 20 09/04/22 1223  SpO2 98 % 09/04/22 1223  Vitals shown include unvalidated device data.  Last Pain:  Vitals:   09/04/22 0846  PainSc: 0-No pain         Complications: There were no known notable events for this encounter.

## 2022-09-04 NOTE — Op Note (Signed)
Patient name: Brian Zavala MRN: [id number] DOB: 11-Mar-1950 Sex: male  09/04/2022 Pre-operative Diagnosis: Symptomatic left carotid stenosis Post-operative diagnosis:  Same Surgeon:  Durene Cal Assistants:  Clinton Gallant, PA Procedure:   #1: Left carotid stent (TCAR)   #2: Retrograde flow reversal neuro protection   #3: Ultrasound-guided access, right femoral vein Anesthesia:  General Blood Loss:  minimal Specimens:  none  Findings: 80% carotid stenosis, resolved after stenting.    Stent:  ENROUTE 8x40 Pre-dilation balloon: 5x40 Flow reversal time:  11 minutes Dose area: 1487 Flouro time:  3:31 mnutes Contrast:  35 cc Procedure time:  60 minutes  Indications: This is a 72 year old gentleman who presented to the hospital several days ago with visual disturbances.  He was diagnosed with a left brain stroke.  He was found to have high-grade bilateral carotid stenosis.  Because of the distal extent of his lesion, I felt carotid stenting would be the preferred method of treatment.  He has been started on aspirin statin and Plavix.  He comes in today for his procedure.  He has had no new symptoms.  Procedure:  The patient was identified in the holding area and taken to [facility] OR ROOM 11  The patient was then placed supine on the table. general anesthesia was administered.  The patient was prepped and draped in the usual sterile fashion.  A time out was called and antibiotics were administered.  A PA was necessary to expedite the procedure and assist with technical details.  She helped with exposure by providing suction and retraction.  She helped with wire exchanges.  She helped with cannulation of the right groin.  Ultrasound used to evaluate the right common femoral vein which was widely patent and easily compressible.  I cannulated the vein under ultrasound guidance with a micropuncture needle.  A 018 wire was inserted without resistance followed by placement of micropuncture sheath.   Next, the PA inserted a 035 wire and then placed the TCAR sheath.  I then evaluated the location of the carotid artery in the left supraclavicular region.  A transverse incision was made over top of the carotid.  Cautery was used to divide subcutaneous tissue and platysma muscle.  I then identified the internal jugular vein and reflected laterally.  Identified the vagus nerve and protected it.  The common carotid artery was then mobilized throughout the width of the incision.  There was 1 area of anterior plaque.  Once the carotid artery was fully mobilized, the patient was fully heparinized.  ACT was confirmed to be over 300.  Because of the location of the plaque, I elected to create a tunnel for the cannulation.  The carotid artery was then cannulated after tunneling under the skin.  Just prior to cannulation, a 5-0 Prolene was placed in the adventitia for preclosure.  Once the carotid artery was cannulated, a 018 wire was inserted up to 4 cm.  A micropuncture sheath was placed.  I then performed a carotid angiogram locating the level of the lesion which was marked on the screen.  A carotid Amplatz wire was then inserted up to the marked line and the TCAR sheath was placed.  It was sutured into position with 2-0 silk.  The flow reversal tubing was connected and passive flow reversal was begun, confirmed with a saline flush.  Next the predilation balloon and wire were prepared on the back table and inserted to the tip of the sheath.  The C-arm was rotated to 25  degrees LAO and a contrast injection was performed locating the carotid bifurcation.  There was a high-grade external carotid stenosis with the 80% left internal carotid stenosis.  A TCAR timeout was performed.  Appropriate hemodynamics were confirmed.  The vessel loop on the proximal carotid artery was tightened and active flow reversal was initiated, confirmed with a saline flush.  I then advanced the wire through the lesion without difficulty.  The  balloon was then positioned.  This was a 5 x 40 balloon.  The balloon was inflated for 30 seconds.  There was no hemodynamic response.  The balloon was then removed and the 8 x 40 stent was inserted and deployed across the lesion.  I waited 2 minutes and then performed completion angiography which showed a widely patent carotid artery.  There was a filling defect from the calcified area in the internal carotid artery however this did not create a stenosis.  I then remove the wire.  The vessel loop was released and flow reversal was discontinued.  Blood was returned to the groin.  I then remove the sheath and closed the arteriotomy by securing the previously placed 5-0 Prolene.  Hand-held Doppler was used to confirm excellent signals in the carotid artery.  50 mg of protamine was administered.  The PA remove the sheath in the left groin and held manual pressure.  Once the neck incision was hemostatic, Surgiflo was placed over top of the artery.  The platysma muscle was closed with a running 3-0 Vicryl and the skin was closed with 4 Monocryl followed by Dermabond.  The patient was successfully extubated.  He was neurologically intact.  He was taken recovery in stable condition.   Disposition: To PACU stable.   Juleen China, M.D., [facility] [place] Office: [phone] Pager:  [phone]

## 2022-09-04 NOTE — Progress Notes (Signed)
2051: Dopamine drip was running at 3 mcg/kg/min, order was d/ced at 1345. Cuff BP 90/62, A-line pressure 126/54, 110/56. Paged DR. Brabham to verify dopamine order, updated vital signs.Per MD continue dopamine at 3 mcg/kg/min and keep A-line SBP above 100, can go up to 5 mcg/kg/min if needed. Received order for 500 Normal saline bolus. See MAR for medication administration. Plan of care continues.

## 2022-09-05 ENCOUNTER — Encounter (HOSPITAL_COMMUNITY): Payer: Self-pay | Admitting: Surgery

## 2022-09-05 LAB — GLUCOSE, CAPILLARY
Glucose-Capillary: 201 mg/dL — ABNORMAL HIGH (ref 70–99)
Glucose-Capillary: 162 mg/dL — ABNORMAL HIGH (ref 70–99)
Glucose-Capillary: 238 mg/dL — ABNORMAL HIGH (ref 70–99)
Glucose-Capillary: 253 mg/dL — ABNORMAL HIGH (ref 70–99)

## 2022-09-05 LAB — LIPID PANEL
Cholesterol: 69 mg/dL (ref 0–200)
HDL: 18 mg/dL — ABNORMAL LOW (ref >40)
LDL Cholesterol: 34 mg/dL (ref 0–99)
Total CHOL/HDL Ratio: 3.8 RATIO
Triglycerides: 87 mg/dL (ref <150)
VLDL: 17 mg/dL (ref 0–40)

## 2022-09-05 LAB — CBC
HCT: 38.3 % — ABNORMAL LOW (ref 39.0–52.0)
Hemoglobin: 12.8 g/dL — ABNORMAL LOW (ref 13.0–17.0)
MCH: 28.8 pg (ref 26.0–34.0)
MCHC: 33.4 g/dL (ref 30.0–36.0)
MCV: 86.3 fL (ref 80.0–100.0)
Platelets: 227 10*3/uL (ref 150–400)
RBC: 4.44 MIL/uL (ref 4.22–5.81)
RDW: 14.7 % (ref 11.5–15.5)
WBC: 6.4 10*3/uL (ref 4.0–10.5)
nRBC: 0 % (ref 0.0–0.2)

## 2022-09-05 LAB — BASIC METABOLIC PANEL
Anion gap: 8 (ref 5–15)
BUN: <5 mg/dL — ABNORMAL LOW (ref 8–23)
CO2: 25 mmol/L (ref 22–32)
Calcium: 7.7 mg/dL — ABNORMAL LOW (ref 8.9–10.3)
Chloride: 100 mmol/L (ref 98–111)
Creatinine, Ser: 0.57 mg/dL — ABNORMAL LOW (ref 0.61–1.24)
GFR, Estimated: >60 mL/min (ref >60)
Glucose, Bld: 207 mg/dL — ABNORMAL HIGH (ref 70–99)
Potassium: 3.1 mmol/L — ABNORMAL LOW (ref 3.5–5.1)
Sodium: 133 mmol/L — ABNORMAL LOW (ref 135–145)

## 2022-09-05 MED ORDER — PSEUDOEPHEDRINE HCL 15 MG/5ML PO LIQD
30.0000 mg | Freq: Four times a day (QID) | ORAL | Status: DC
  Administered 2022-09-05 – 2022-09-06 (×4): 30 mg via ORAL
  Filled 2022-09-05 (×6): qty 10

## 2022-09-05 NOTE — Progress Notes (Addendum)
  Progress Note    09/05/2022 9:43 AM 1 Day Post-Op  Subjective:  no complaints. Tolerating diet. Denies any difficulty speaking or swallowing. Would like to go home   Vitals:   09/05/22 0728 09/05/22 0800  BP: 108/69 116/82  Pulse: 78 82  Resp: 19   Temp: 97.8 F (36.6 C)   SpO2: 94% 96%   Physical Exam: Cardiac:  regular Lungs:  non labored Incisions:  left supraclavicular incision is c/d/I without swelling or hematoma Extremities:  moving extremities without deficits Neurologic: alert and oriented. Smile symmetric. Tongue midline  CBC    Component Value Date/Time   WBC 6.4 09/05/2022 0447   RBC 4.44 09/05/2022 0447   HGB 12.8 (L) 09/05/2022 0447   HGB 14.9 05/02/2017 1408   HCT 38.3 (L) 09/05/2022 0447   HCT 43.3 05/02/2017 1408   PLT 227 09/05/2022 0447   PLT 235 05/02/2017 1408   MCV 86.3 09/05/2022 0447   MCV 85 05/02/2017 1408   MCH 28.8 09/05/2022 0447   MCHC 33.4 09/05/2022 0447   RDW 14.7 09/05/2022 0447   RDW 14.6 05/02/2017 1408   LYMPHSABS 1.2 08/31/2022 1735   LYMPHSABS 1.4 05/02/2017 1408   MONOABS 0.7 08/31/2022 1735   EOSABS 0.3 08/31/2022 1735   EOSABS 0.2 05/02/2017 1408   BASOSABS 0.1 08/31/2022 1735   BASOSABS 0.0 05/02/2017 1408    BMET    Component Value Date/Time   NA 133 (L) 09/05/2022 0447   NA 138 05/02/2017 1408   K 3.1 (L) 09/05/2022 0447   CL 100 09/05/2022 0447   CO2 25 09/05/2022 0447   GLUCOSE 207 (H) 09/05/2022 0447   BUN <5 (L) 09/05/2022 0447   BUN 8 05/02/2017 1408   CREATININE 0.57 (L) 09/05/2022 0447   CREATININE 0.84 12/04/2017 1446   CALCIUM 7.7 (L) 09/05/2022 0447   GFRNONAA >60 09/05/2022 0447   GFRAA >60 11/27/2018 0931    INR    Component Value Date/Time   INR 1.0 08/31/2022 1735     Intake/Output Summary (Last 24 hours) at 09/05/2022 0943 Last data filed at 09/05/2022 0500 Gross per 24 hour  Intake 1220 ml  Output 2300 ml  Net -1080 ml     Assessment/Plan:  72 y.o. male is s/p #1: Left  carotid stent (TCAR) #2: Retrograde flow reversal neuro protection #3: Ultrasound-guided access, right femoral vein 1 Day Post-Op   Neurologically intact Left supraclavicular incision is clean, dry and intact. No swelling or hematoma BP soft on Dopamine Will order pseudoephedrine dose and try to wean dopamine Continue Aspirin, statin, Plavix Will likely need 1 more day to wean off dopamine and make sure bp stable for safe d/c  Graceann Congress, PA-C [facility] [phone] 09/05/2022 9:43 AM  I have independently interviewed and examined patient and agree with PA assessment and plan above.  Initiate Sudafed with hopes of weaning dopamine and discharge when blood pressure has stabilized.  Akito Boomhower C. Randie Heinz, MD [facility] [place] Office: [phone] Pager: [phone]

## 2022-09-05 NOTE — Progress Notes (Signed)
PHARMACIST LIPID MONITORING   Brian Zavala is a 72 y.o. male admitted on 09/04/2022 with the diagnosis of Symptomatic left carotid artery stenosis.  Pharmacy has been consulted to optimize lipid-lowering therapy with the indication of secondary prevention for clinical ASCVD.  Recent Labs:  Lipid Panel (last 6 months):   Lab Results  Component Value Date   CHOL 69 09/05/2022   TRIG 87 09/05/2022   HDL 18 (L) 09/05/2022   CHOLHDL 3.8 09/05/2022   VLDL 17 09/05/2022   LDLCALC 34 09/05/2022    Hepatic function panel (last 6 months):   Lab Results  Component Value Date   AST 23 08/31/2022   ALT 45 (H) 08/31/2022   ALKPHOS 80 08/31/2022   BILITOT 0.7 08/31/2022   BILIDIR 0.17 08/22/2022    SCr (since admission):   Serum creatinine: 0.57 mg/dL (L) 16/10/96 0454 Estimated creatinine clearance: 88.5 mL/min (A)  Current therapy and lipid therapy tolerance Current lipid-lowering therapy: lipitor Documented or reported allergies or intolerances to lipid-lowering therapies (if applicable): none  Assessment:   Patient prefers no changes in lipid-lowering therapy at this time due to LDL 34  Plan:    1.Statin intensity (high intensity recommended for all patients regardless of the LDL):  No statin changes. The patient is already on a high intensity statin.  2.Add ezetimibe (if any one of the following):   Not indicated at this time.  3.Refer to lipid clinic:   No  4.Follow-up with:  Primary care provider - Reed, Tiffany L, DO  5.Follow-up labs after discharge:  No changes in lipid therapy, repeat a lipid panel in one year.       Greta Doom BS, PharmD, BCPS Clinical Pharmacist 09/05/2022 9:44 AM  Contact: [phone] after 3 PM  "Be curious, not judgmental..." -Debbora Dus

## 2022-09-06 LAB — POCT ACTIVATED CLOTTING TIME: Activated Clotting Time: 305 seconds

## 2022-09-06 LAB — GLUCOSE, CAPILLARY: Glucose-Capillary: 172 mg/dL — ABNORMAL HIGH (ref 70–99)

## 2022-09-06 NOTE — Progress Notes (Signed)
Discharge instructions reviewed with pt. Pt verbalized understanding of instructions.  Copy of instructions given to pt. No new scripts, no changes in medications.  Pt's wife to be at main entrance in next few minutes to pick him up.  Pt to be d/c'd via wheelchair with belongings.            To be escorted by staff, pt has AKA, and may need assistance into the car.   Alynn Ellithorpe,RN SWOT

## 2022-09-06 NOTE — Progress Notes (Signed)
[facility] [place]  Subjective  - BP improved off Dopamine   Objective 111/65 74 97.6 F (36.4 C) (Oral) 18 100%  Intake/Output Summary (Last 24 hours) at 09/06/2022 8295 Last data filed at 09/06/2022 0347 Gross per 24 hour  Intake 2835.98 ml  Output 1210 ml  Net 1625.98 ml    111-134 systolic Left neck incision healing well No neurologic deficits no tongue deviation Moving all extremities   Assessment/Planning: POD # 2 Left carotid stent (TCAR) #2: Retrograde flow reversal neuro protection #3: Ultrasound-guided access, right femoral vein 1 Day Post-Op   BP 111-134 systolic off dopamine Continue ASA, Plavix, and Statin Neurologically intact Plan for discharge today    Mosetta Pigeon 09/06/2022 7:02 AM --  Laboratory Lab Results: Recent Labs    09/04/22 1545 09/05/22 0447  WBC 6.8 6.4  HGB 13.5 12.8*  HCT 41.0 38.3*  PLT 224 227   BMET Recent Labs    09/04/22 1545 09/05/22 0447  NA  --  133*  K  --  3.1*  CL  --  100  CO2  --  25  GLUCOSE  --  207*  BUN  --  <5*  CREATININE 0.62 0.57*  CALCIUM  --  7.7*    COAG Lab Results  Component Value Date   INR 1.0 08/31/2022   INR 1.1 09/09/2018   INR 1.1 08/21/2018   No results found for: "PTT"

## 2022-09-06 NOTE — Discharge Instructions (Signed)
   [facility] [place]  Discharge Instructions   Carotid Endarterectomy (CEA)  Please refer to the following instructions for your post-procedure care. Your surgeon or physician assistant will discuss any changes with you.  Activity  You are encouraged to walk as much as you can. You can slowly return to normal activities but must avoid strenuous activity and heavy lifting until your doctor tell you it's OK. Avoid activities such as vacuuming or swinging a golf club. You can drive after one week if you are comfortable and you are no longer taking prescription pain medications. It is normal to feel tired for serval weeks after your surgery. It is also normal to have difficulty with sleep habits, eating, and bowel movements after surgery. These will go away with time.  Bathing/Showering  You may shower after you come home. Do not soak in a bathtub, hot tub, or swim until the incision heals completely.  Incision Care  Shower every day. Clean your incision with mild soap and water. Pat the area dry with a clean towel. You do not need a bandage unless otherwise instructed. Do not apply any ointments or creams to your incision. You may have skin glue on your incision. Do not peel it off. It will come off on its own in about one week. Your incision may feel thickened and raised for several weeks after your surgery. This is normal and the skin will soften over time. For Men Only: It's OK to shave around the incision but do not shave the incision itself for 2 weeks. It is common to have numbness under your chin that could last for several months.  Diet  Resume your normal diet. There are no special food restrictions following this procedure. A low fat/low cholesterol diet is recommended for all patients with vascular disease. In order to heal from your surgery, it is CRITICAL to get adequate nutrition. Your body requires vitamins, minerals, and protein. Vegetables are the best  source of vitamins and minerals. Vegetables also provide the perfect balance of protein. Processed food has little nutritional value, so try to avoid this.        Medications  Resume taking all of your medications unless your doctor or physician assistant tells you not to. If your incision is causing pain, you may take over-the- counter pain relievers such as acetaminophen (Tylenol). If you were prescribed a stronger pain medication, please be aware these medications can cause nausea and constipation. Prevent nausea by taking the medication with a snack or meal. Avoid constipation by drinking plenty of fluids and eating foods with a high amount of fiber, such as fruits, vegetables, and grains. Do not take Tylenol if you are taking prescription pain medications.  Follow Up  Our office will schedule a follow up appointment 2-3 weeks following discharge.  Please call us immediately for any of the following conditions  Increased pain, redness, drainage (pus) from your incision site. Fever of 101 degrees or higher. If you should develop stroke (slurred speech, difficulty swallowing, weakness on one side of your body, loss of vision) you should call 911 and go to the nearest emergency room.  Reduce your risk of vascular disease:  Stop smoking. If you would like help call [facility] at 1-800-QUIT-NOW (1-800-784-8669) or [place] at [phone]. Manage your cholesterol Maintain a desired weight Control your diabetes Keep your blood pressure down  If you have any questions, please call the office at [phone].   

## 2022-09-09 ENCOUNTER — Ambulatory Visit: Payer: Medicare (Managed Care) | Admitting: Physician Assistant

## 2022-09-09 NOTE — Discharge Summary (Signed)
[facility] Discharge Summary   Patient ID:  Brian Zavala MRN: [id number] DOB/AGE: [birth year] 72 y.o.  Admit date: 09/04/2022 Discharge date: 09/06/22 Date of Surgery: 09/04/2022 Surgeon: Surgeon(s): Nada Libman, MD  Admission Diagnosis: Carotid stenosis, symptomatic w/o infarct [I65.29]  Discharge Diagnoses:  Carotid stenosis, symptomatic w/o infarct [I65.29]  Secondary Diagnoses: Past Medical History:  Diagnosis Date   Arthritis    "pretty much all over"   Cellulitis of left foot    COPD (chronic obstructive pulmonary disease) (HCC)    Dyspnea    Hyperlipidemia    Hypertension    Nausea and vomiting 08/20/2022   Neuromuscular disorder (HCC)    neuropathy legs   Neuropathy    legs   Osteomyelitis (HCC) 04/2018   4th toe left foot   Osteomyelitis of fourth toe of left foot (HCC) 04/19/2018   Peripheral vascular disease (HCC)    Sleep apnea    does not use a cpap   Stroke (HCC)    Type II diabetes mellitus (HCC)    Wears glasses    Wears partial dentures    top and bottom partials    Procedure(s): Left Transcarotid Artery Revascularization ULTRASOUND GUIDANCE FOR VASCULAR ACCESS, RIGHT FEMORAL VEIN  Discharged Condition: good  HPI: Brian Zavala is a 72 y.o. male, who presented to the hospital with right eye vision loss.     Hospital Course:  Brian Zavala is a 72 y.o. male is S/P {Right, left-initial cap:5607} Procedure(s): Left Transcarotid Artery Revascularization ULTRASOUND GUIDANCE FOR VASCULAR ACCESS, RIGHT FEMORAL VEIN Extubated: POD # {Numbers 1-12 multi-select:20307} Physical exam: *** Post-op wounds {Post-op wounds:31599::healing well} Pt. Ambulating, voiding and taking PO diet without difficulty. Pt pain controlled with PO pain meds. Labs as below Complications:{NONE DEFAULTED:18576::none}  Consults:    Significant Diagnostic Studies: CBC Lab Results  Component Value Date   WBC 6.4 09/05/2022   HGB 12.8  (L) 09/05/2022   HCT 38.3 (L) 09/05/2022   MCV 86.3 09/05/2022   PLT 227 09/05/2022    BMET    Component Value Date/Time   NA 133 (L) 09/05/2022 0447   NA 138 05/02/2017 1408   K 3.1 (L) 09/05/2022 0447   CL 100 09/05/2022 0447   CO2 25 09/05/2022 0447   GLUCOSE 207 (H) 09/05/2022 0447   BUN <5 (L) 09/05/2022 0447   BUN 8 05/02/2017 1408   CREATININE 0.57 (L) 09/05/2022 0447   CREATININE 0.84 12/04/2017 1446   CALCIUM 7.7 (L) 09/05/2022 0447   GFRNONAA >60 09/05/2022 0447   GFRAA >60 11/27/2018 0931   COAG Lab Results  Component Value Date   INR 1.0 08/31/2022   INR 1.1 09/09/2018   INR 1.1 08/21/2018     Disposition:  Discharge to :{Discharge Destination:18313::"Home"} Discharge Instructions     Call MD for:  redness, tenderness, or signs of infection (pain, swelling, bleeding, redness, odor or green/yellow discharge around incision site)   Complete by: As directed    Call MD for:  redness, tenderness, or signs of infection (pain, swelling, bleeding, redness, odor or green/yellow discharge around incision site)   Complete by: As directed    Call MD for:  severe or increased pain, loss or decreased feeling  in affected limb(s)   Complete by: As directed    Call MD for:  severe or increased pain, loss or decreased feeling  in affected limb(s)   Complete by: As directed    Call MD for:  temperature >100.5  Complete by: As directed    Call MD for:  temperature >100.5   Complete by: As directed    Discharge instructions   Complete by: As directed    Shower as needed   Resume previous diet   Complete by: As directed    Resume previous diet   Complete by: As directed       Allergies as of 09/06/2022       Reactions   Varenicline Nausea Only        Medication List     TAKE these medications    acetaminophen 500 MG tablet Commonly known as: TYLENOL Take 1,000 mg by mouth every 6 (six) hours as needed for moderate pain.   aspirin 81 MG chewable  tablet Chew 81 mg by mouth daily.   atorvastatin 80 MG tablet Commonly known as: LIPITOR Take 1 tablet (80 mg total) by mouth daily at 6 PM.   calcium carbonate 1500 (600 Ca) MG Tabs tablet Commonly known as: OSCAL Take 600 mg of elemental calcium by mouth daily with breakfast.   cholecalciferol 1000 units tablet Commonly known as: VITAMIN D Take 1,000 Units by mouth daily.   clopidogrel 75 MG tablet Commonly known as: PLAVIX Take 1 tablet (75 mg total) by mouth daily.   Enbrel 50 MG/ML injection Generic drug: etanercept Inject 50 mg into the skin once a week.   Eucerin Advanced Repair Crea Apply 1 application  topically 2 (two) times daily.   fluticasone 50 MCG/ACT nasal spray Commonly known as: FLONASE Place 2 sprays into both nostrils daily as needed for allergies or rhinitis.   furosemide 20 MG tablet Commonly known as: LASIX Take 20 mg by mouth daily as needed for fluid or edema.   hydrochlorothiazide 12.5 MG capsule Commonly known as: MICROZIDE Take 12.5 mg by mouth daily.   leflunomide 20 MG tablet Commonly known as: ARAVA Take 20 mg by mouth daily.   metoprolol succinate 25 MG 24 hr tablet Commonly known as: TOPROL-XL Take 25 mg by mouth at bedtime.   morphine 100 MG 12 hr tablet Commonly known as: MS CONTIN Take 100 mg by mouth every 12 (twelve) hours.   nicotine 21 mg/24hr patch Commonly known as: NICODERM CQ - dosed in mg/24 hours Place 21 mg onto the skin daily.   ondansetron 4 MG disintegrating tablet Commonly known as: ZOFRAN-ODT Take 1 tablet (4 mg total) by mouth every 8 (eight) hours as needed for nausea or vomiting.   Ozempic (0.25 or 0.5 MG/DOSE) 2 MG/1.5ML Sopn Generic drug: Semaglutide(0.25 or 0.5MG /DOS) Inject 0.5 mg into the skin once a week.   predniSONE 1 MG tablet Commonly known as: DELTASONE Take 2 mg by mouth daily with breakfast.   pregabalin 150 MG capsule Commonly known as: LYRICA Take 150 mg by mouth 2 (two) times  daily.   PREVIDENT 5000 BOOSTER PLUS DT Place 1 application  onto teeth every evening.   promethazine 25 MG tablet Commonly known as: PHENERGAN Take 25 mg by mouth every 8 (eight) hours as needed for nausea or vomiting.   promethazine 25 MG/ML injection Commonly known as: PHENERGAN Inject 0.5 mLs (12.5 mg total) into the vein once as needed for nausea or vomiting. If not responding to oral promethazine.   Semglee 100 UNIT/ML injection Generic drug: insulin glargine Inject 75 Units into the skin daily.   vitamin B-12 100 MCG tablet Commonly known as: CYANOCOBALAMIN Take 100 mcg by mouth daily.       Verbal and  written Discharge instructions given to the patient. Wound care per Discharge AVS  Follow-up Information     Nada Libman, MD Follow up in 4 week(s).   Specialties: Vascular Surgery, Cardiology Why: Office will call you to arrange your appt (sent) Contact information: [address] [place] [address] [phone]                 Signed: Mosetta Pigeon 09/09/2022, 7:59 AM --- For VQI Registry use --- Instructions: Press F2 to tab through selections.  Delete question if not applicable.   Modified Rankin score at D/C (0-6): {Rankin Score:20980}  IV medication needed for:  1. Hypertension: {yes/no:20286} 2. Hypotension: {yes/no:20286}  Post-op Complications: {yes/no:20286}  1. Post-op CVA or TIA: {yes/no:20286}  If yes: Event classification (right eye, left eye, right cortical, left cortical, verterobasilar, other): ***  If yes: Timing of event (intra-op, <6 hrs post-op, >=6 hrs post-op, unknown): ***  2. CN injury: {yes/no:20286}  If yes: CN *** injuried   3. Myocardial infarction: {yes/no:20286}  If yes: Dx by (EKG or clinical, Troponin): ***  4.  CHF: {yes/no:20286}  5.  Dysrhythmia (new): {yes/no:20286}  6. Wound infection: {yes/no:20286}  7. Reperfusion symptoms: {yes/no:20286}  8. Return to OR: {yes/no:20286}  If yes:  return to OR for (bleeding, neurologic, other CEA incision, other): ***  Discharge medications: Statin use:  {VQI Pre-op Medication:20976} ASA use:  {VQI Pre-op Medication:20976} Beta blocker use:  {VQI Pre-op Medication:20976} ACE-Inhibitor use:  {VQI Pre-op Medication:20976} P2Y12 Antagonist use: [ ]  None, [ ]  Plavix, [ ]  Plasugrel, [ ]  Ticlopinine, [ ]  Ticagrelor, [ ]  Other, [ ]  No for medical reason, [ ]  Non-compliant, [ ]  Not-indicated Anti-coagulant use:  [ ]  None, [ ]  Warfarin, [ ]  Rivaroxaban, [ ]  Dabigatran, [ ]  Other, [ ]  No for medical reason, [ ]  Non-compliant, [ ]  Not-indicated

## 2022-09-09 NOTE — Progress Notes (Deleted)
[facility] [place]  ASSESSMENT / PLAN: 72 y.o. male with *** - ***  CHIEF COMPLAINT: ***  HISTORY OF PRESENT ILLNESS: Brian Zavala is a 72 y.o. male ***  VASCULAR SURGICAL HISTORY: ***  VASCULAR RISK FACTORS: {FINDINGS; POSITIVE NEGATIVE:[id number]} history of stroke / transient ischemic attack. {FINDINGS; POSITIVE NEGATIVE:[id number]} history of coronary artery disease. *** history of PCI. *** history of CABG.  {FINDINGS; POSITIVE NEGATIVE:[id number]} history of diabetes mellitus. Last A1c ***. {FINDINGS; POSITIVE NEGATIVE:[id number]} history of smoking. *** actively smoking. {FINDINGS; POSITIVE NEGATIVE:[id number]} history of hypertension. *** drug regimen with *** control. {FINDINGS; POSITIVE NEGATIVE:[id number]} history of chronic kidney disease.  Last GFR ***. CKD {stage:30421363}. {FINDINGS; POSITIVE NEGATIVE:[id number]} history of chronic obstructive pulmonary disease, treated with ***.  FUNCTIONAL STATUS: ECOG performance status: {findings; ecog performance status:31780} Ambulatory status: {TNHAmbulation:25868}  CAREY 1 AND 3 YEAR INDEX Male (2pts) 75-79 or 80-84 (2pts) >84 (3pts) Dependence in toileting (1pt) Partial or full dependence in dressing (1pt) History of malignant neoplasm (2pts) CHF (3pts) COPD (1pts) CKD (3pts)  0-3 pts 6% 1 year mortality ; 21% 3 year mortality 4-5 pts 12% 1 year mortality ; 36% 3 year mortality >5 pts 21% 1 year mortality; 54% 3 year mortality   Past Medical History:  Diagnosis Date   Arthritis    "pretty much all over"   Cellulitis of left foot    COPD (chronic obstructive pulmonary disease) (HCC)    Dyspnea    Hyperlipidemia    Hypertension    Nausea and vomiting 08/20/2022   Neuromuscular disorder (HCC)    neuropathy legs   Neuropathy    legs   Osteomyelitis (HCC) 04/2018   4th toe left foot   Osteomyelitis of fourth toe of left foot (HCC) 04/19/2018   Peripheral vascular disease (HCC)     Sleep apnea    does not use a cpap   Stroke (HCC)    Type II diabetes mellitus (HCC)    Wears glasses    Wears partial dentures    top and bottom partials    Past Surgical History:  Procedure Laterality Date   ABDOMINAL AORTOGRAM W/LOWER EXTREMITY Bilateral 10/19/2018   Procedure: ABDOMINAL AORTOGRAM W/LOWER EXTREMITY;  Surgeon: [name], MD;  [facility];  Service: Cardiovascular;  Laterality: Bilateral;   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 Cornett, MD;  Location: [facility] OR;  Service: General;  Laterality: N/A;   COLONOSCOPY     CYST EXCISION Bilateral 02/15/2015   Procedure: LEFT INDEX FINGER AND RIGHT MIDDLE FINGER NODULE EXCISION;  Surgeon: Tarry Kos, MD;  Location: [facility];  Service: Orthopedics;  Laterality: Bilateral;   CYST EXCISION PERINEAL N/A 12/08/2012   Procedure: CYST EXCISION PERINeum;  Surgeon: Maisie Fus A. Cornett, MD;  Location: [facility];  Service: General;  Laterality: N/A;   FOOT AMPUTATION Right 2005   I & D EXTREMITY Right 03/07/2017   Procedure: EXCISION FIBULAR HEAD RIGHT BELOW KNEE AMPUTATION;  Surgeon: [name], MD;  Location: [facility]  OR;  Service: Orthopedics;  Laterality: Right;   INCISE AND DRAIN ABCESS  12/02/2014   PERINEAL ABSCESS   INCISION AND DRAINAGE PERIRECTAL ABSCESS Left 12/02/2014   Procedure: IRRIGATION AND DEBRIDEMENT PERINEAL ABSCESS;  Surgeon: Abigail Miyamoto, MD;  Location: [facility] OR;  Service: General;  Laterality: Left;    LEG AMPUTATION BELOW KNEE Right 2005   LOWER EXTREMITY ANGIOGRAPHY N/A 05/05/2017   Procedure: LOWER EXTREMITY ANGIOGRAPHY;  Surgeon: [name], MD;  [facility];  Service: Cardiovascular;  Laterality: N/A;   LUMBAR DISC SURGERY  82,90   ruptured disc   PERIPHERAL VASCULAR INTERVENTION Right 05/05/2017   Procedure: PERIPHERAL VASCULAR INTERVENTION;  Surgeon: [name], MD;  [facility]: [facility];  Service: Cardiovascular;  Laterality: Right;   PERIPHERAL VASCULAR INTERVENTION Right 10/19/2018   Procedure: PERIPHERAL VASCULAR INTERVENTION;  Surgeon: [name], MD;  [facility];  Service: Cardiovascular;  Laterality: Right;   SCAR REVISION Right 2005   @ amputation   STUMP REVISION Right 08/21/2018   Procedure: REVISION RIGHT BELOW KNEE AMPUTATION, EXCISION FIBULA;  Surgeon: [name], MD;  Location: [facility];  Service: Orthopedics;  Laterality: Right;   STUMP REVISION Right 09/09/2018   Procedure: REVISION RIGHT BELOW KNEE AMPUTATION;  Surgeon: [name], MD;  Location: [facility] OR;  Service: Orthopedics;  Laterality: Right;   TOE AMPUTATION Right 2005   TRANSCAROTID ARTERY REVASCULARIZATION  Left 09/04/2022   Procedure: Left Transcarotid Artery Revascularization;  Surgeon: Nada Libman, MD;  Location: [facility] OR;  Service: Vascular;  Laterality: Left;   ULTRASOUND GUIDANCE FOR VASCULAR ACCESS Right 09/04/2022   Procedure: ULTRASOUND GUIDANCE FOR VASCULAR ACCESS, RIGHT FEMORAL VEIN;  Surgeon: Nada Libman, MD;  Location: [facility] OR;  Service: Vascular;  Laterality: Right;    Family History  Problem Relation Age of Onset   Cirrhosis Father     Social History   Socioeconomic History   Marital status: Divorced    Spouse name: Not on file   Number of children: Not on file   Years of education: Not on file   Highest education level: Not on file  Occupational History   Not on file  Tobacco Use   Smoking status: Every Day     Packs/day: 1.00    Years: 47.00    Additional pack years: 0.00    Total pack years: 47.00    Types: Cigarettes    Start date: 03/04/1965   Smokeless tobacco: Never  Vaping Use   Vaping Use: Never used  Substance and Sexual Activity   Alcohol use: No   Drug use: No   Sexual activity: Not Currently  Other Topics Concern   Not on file  Social History Narrative   Not on file   Social Determinants of Health   Financial Resource Strain: Not on file  Food Insecurity: No Food Insecurity (08/20/2022)   Hunger Vital Sign    Worried About Running Out of Food in the Last Year: Never true    Ran Out of Food in the Last Year: Never true  Transportation Needs: No Transportation Needs (08/20/2022)   PRAPARE - Administrator, Civil Service (Medical): No    Lack of Transportation (Non-Medical): No  Physical Activity: Not on file  Stress: Not on file  Social Connections: Not on file  Intimate Partner Violence: Not At Risk (08/20/2022)   Humiliation, Afraid, Rape, and Kick questionnaire    Fear of Current or Ex-Partner:  No    Emotionally Abused: No    Physically Abused: No    Sexually Abused: No    Allergies  Allergen Reactions   Varenicline Nausea Only    Current Outpatient Medications  Medication Sig Dispense Refill   acetaminophen (TYLENOL) 500 MG tablet Take 1,000 mg by mouth every 6 (six) hours as needed for moderate pain.     aspirin 81 MG chewable tablet Chew 81 mg by mouth daily.     atorvastatin (LIPITOR) 80 MG tablet Take 1 tablet (80 mg total) by mouth daily at 6 PM. 30 tablet 2   calcium carbonate (OSCAL) 1500 (600 Ca) MG TABS tablet Take 600 mg of elemental calcium by mouth daily with breakfast.      cholecalciferol (VITAMIN D) 1000 UNITS tablet Take 1,000 Units by mouth daily.     clopidogrel (PLAVIX) 75 MG tablet Take 1 tablet (75 mg total) by mouth daily. 30 tablet 0   Emollient (EUCERIN ADVANCED REPAIR) CREA Apply 1 application  topically 2 (two) times daily.      etanercept (ENBREL) 50 MG/ML injection Inject 50 mg into the skin once a week.     fluticasone (FLONASE) 50 MCG/ACT nasal spray Place 2 sprays into both nostrils daily as needed for allergies or rhinitis.     furosemide (LASIX) 20 MG tablet Take 20 mg by mouth daily as needed for fluid or edema.     hydrochlorothiazide (MICROZIDE) 12.5 MG capsule Take 12.5 mg by mouth daily.     insulin glargine (SEMGLEE) 100 UNIT/ML injection Inject 75 Units into the skin daily.     leflunomide (ARAVA) 20 MG tablet Take 20 mg by mouth daily.     metoprolol succinate (TOPROL-XL) 25 MG 24 hr tablet Take 25 mg by mouth at bedtime.     morphine (MS CONTIN) 100 MG 12 hr tablet Take 100 mg by mouth every 12 (twelve) hours.     nicotine (NICODERM CQ - DOSED IN MG/24 HOURS) 21 mg/24hr patch Place 21 mg onto the skin daily.     ondansetron (ZOFRAN-ODT) 4 MG disintegrating tablet Take 1 tablet (4 mg total) by mouth every 8 (eight) hours as needed for nausea or vomiting. 20 tablet 0   predniSONE (DELTASONE) 1 MG tablet Take 2 mg by mouth daily with breakfast.     pregabalin (LYRICA) 150 MG capsule Take 150 mg by mouth 2 (two) times daily.     promethazine (PHENERGAN) 25 MG tablet Take 25 mg by mouth every 8 (eight) hours as needed for nausea or vomiting.     promethazine (PHENERGAN) 25 MG/ML injection Inject 0.5 mLs (12.5 mg total) into the vein once as needed for nausea or vomiting. If not responding to oral promethazine. 1 mL 0   Semaglutide,0.25 or 0.5MG /DOS, (OZEMPIC, 0.25 OR 0.5 MG/DOSE,) 2 MG/1.5ML SOPN Inject 0.5 mg into the skin once a week.     Sodium Fluoride (PREVIDENT 5000 BOOSTER PLUS DT) Place 1 application  onto teeth every evening.     vitamin B-12 (CYANOCOBALAMIN) 100 MCG tablet Take 100 mcg by mouth daily.     No current facility-administered medications for this visit.    PHYSICAL EXAM There were no vitals filed for this visit.  Constitutional: *** appearing. *** distress. Appears *** nourished.   Neurologic: CN ***. *** focal findings. *** sensory loss. Psychiatric: *** Mood and affect symmetric and appropriate. Eyes: *** No icterus. No conjunctival pallor. Ears, nose, throat: *** mucous membranes moist. Midline trachea.  Cardiac: ***  rate and rhythm.  Respiratory: *** unlabored. Abdominal: *** soft, non-tender, non-distended.  Peripheral vascular: *** Extremity: *** edema. *** cyanosis. *** pallor.  Skin: *** gangrene. *** ulceration.  Lymphatic: *** Stemmer's sign. *** palpable lymphadenopathy.    PERTINENT LABORATORY AND RADIOLOGIC DATA  Most recent CBC    Latest Ref Rng & Units 09/05/2022    4:47 AM 09/04/2022    3:45 PM 08/31/2022    8:27 PM  CBC  WBC 4.0 - 10.5 K/uL 6.4  6.8  6.6   Hemoglobin 13.0 - 17.0 g/dL 09.8  11.9  14.7   Hematocrit 39.0 - 52.0 % 38.3  41.0  44.3   Platelets 150 - 400 K/uL 227  224  243      Most recent CMP    Latest Ref Rng & Units 09/05/2022    4:47 AM 09/04/2022    3:45 PM 08/31/2022    8:27 PM  CMP  Glucose 70 - 99 mg/dL 829     BUN 8 - 23 mg/dL <5     Creatinine 5.62 - 1.24 mg/dL 1.30  8.65  7.84   Sodium 135 - 145 mmol/L 133     Potassium 3.5 - 5.1 mmol/L 3.1     Chloride 98 - 111 mmol/L 100     CO2 22 - 32 mmol/L 25     Calcium 8.9 - 10.3 mg/dL 7.7       Renal function Estimated Creatinine Clearance: 88.5 mL/min (A) (by C-G formula based on SCr of 0.57 mg/dL (L)).  Hgb A1c MFr Bld (%)  Date Value  08/21/2022 7.0 (H)    LDL Chol Calc (NIH)  Date Value Ref Range Status  06/29/2019 36 0 - 99 mg/dL Final   LDL Cholesterol  Date Value Ref Range Status  09/05/2022 34 0 - 99 mg/dL Final    Comment:           Total Cholesterol/HDL:CHD Risk Coronary Heart Disease Risk Table                     Men   Women  1/2 Average Risk   3.4   3.3  Average Risk       5.0   4.4  2 X Average Risk   9.6   7.1  3 X Average Risk  23.4   11.0        Use the calculated Patient Ratio above and the CHD Risk Table to determine the patient's  CHD Risk.        ATP III CLASSIFICATION (LDL):  <100     mg/dL   Optimal  696-295  mg/dL   Near or Above                    Optimal  130-159  mg/dL   Borderline  284-132  mg/dL   High  >440     mg/dL   Very High Performed at [facility], [address], [place], [place] [address]      Vascular Imaging: ***  Rande Brunt. Lenell Antu, MD FACS [facility] Phone Number: [phone] 09/09/2022 8:39 AM   Total time spent on preparing this encounter including chart review, data review, collecting history, examining the patient, coordinating care for this {tnhtimebilling:26202}  Portions of this report may have been transcribed using voice recognition software.  Every effort has been made to ensure accuracy; however, inadvertent computerized transcription errors may still be  present.

## 2022-09-10 ENCOUNTER — Encounter: Payer: Medicare (Managed Care) | Admitting: Vascular Surgery

## 2022-09-16 ENCOUNTER — Inpatient Hospital Stay: Admission: RE | Admit: 2022-09-16 | Payer: Medicare (Managed Care) | Source: Ambulatory Visit

## 2022-09-18 ENCOUNTER — Encounter (HOSPITAL_COMMUNITY): Payer: Medicare (Managed Care)

## 2022-09-23 ENCOUNTER — Other Ambulatory Visit: Payer: Self-pay | Admitting: *Deleted

## 2022-09-23 DIAGNOSIS — I6522 Occlusion and stenosis of left carotid artery: Secondary | ICD-10-CM

## 2022-10-14 ENCOUNTER — Ambulatory Visit (INDEPENDENT_AMBULATORY_CARE_PROVIDER_SITE_OTHER): Payer: Medicare (Managed Care) | Admitting: Surgery

## 2022-10-14 ENCOUNTER — Ambulatory Visit (HOSPITAL_COMMUNITY)
Admission: RE | Admit: 2022-10-14 | Discharge: 2022-10-14 | Disposition: A | Discharge status: Home / Self Care | Payer: Medicare (Managed Care) | Source: Ambulatory Visit | Attending: Surgery | Admitting: Surgery

## 2022-10-14 ENCOUNTER — Encounter: Payer: Self-pay | Admitting: Surgery

## 2022-10-14 VITALS — BP 104/71 | HR 98 | Temp 97.7°F

## 2022-10-14 DIAGNOSIS — I70213 Atherosclerosis of native arteries of extremities with intermittent claudication, bilateral legs: Secondary | ICD-10-CM | POA: Diagnosis not present

## 2022-10-14 DIAGNOSIS — I6522 Occlusion and stenosis of left carotid artery: Secondary | ICD-10-CM | POA: Diagnosis present

## 2022-10-14 DIAGNOSIS — I6523 Occlusion and stenosis of bilateral carotid arteries: Secondary | ICD-10-CM

## 2022-10-14 NOTE — Progress Notes (Signed)
[facility]ist [facility] [place]  Patient name: Brian Zavala MRN: [id number] DOB: 1950-08-11 Sex: male   REASON FOR VISIT:    Follow up  Brian OF PRESENT ILLNESS:    Brian Zavala is a 72 y.o. male who presented to the hospital on 09/01/2022 with right eye vision loss.  His workup revealed at the left occipital lobe.  CT scan revealed high-grade bilateral carotid stenosis.  On 09/04/2022, he underwent left-sided TCAR.  Intraoperative images revealed 80% stenosis that was resolved after stenting.  He was discharged home the following day.  He has had no new neurologic symptoms and his vision is about back to baseline  The patient has an extensive history of peripheral vas and has undergone percutaneous intervention by Dr. Gery Pray in the past.  He has had a right above-knee amputation.  He denies any symptoms in his left leg such as claudication, or ulceration.  There was also concern of her vascular disease and abdominal pain.  His abdominal pain has resolved.   PAST MEDICAL HISTORY:   Past Medical History:  Diagnosis Date   Arthritis    "pretty much all over"   Cellulitis of left foot    COPD (chronic obstructive pulmonary disease) (HCC)    Dyspnea    Hyperlipidemia    Hypertension    Nausea and vomiting 08/20/2022   Neuromuscular disorder (HCC)    neuropathy legs   Neuropathy    legs   Osteomyelitis (HCC) 04/2018   4th toe left foot   Osteomyelitis of fourth toe of left foot (HCC) 04/19/2018   Peripheral vascular disease (HCC)    Sleep apnea    does not use a cpap   Stroke (HCC)    Type II diabetes mellitus (HCC)    Wears glasses    Wears partial dentures    top and bottom partials     FAMILY HISTORY:   Family History  Problem Relation Age of Onset   Cirrhosis Father     SOCIAL HISTORY:   Social History   Tobacco Use   Smoking status: Every Day    Current packs/day: 1.00    Average packs/day: 1 pack/day  for 57.6 years (57.6 ttl pk-yrs)    Types: Cigarettes    Start date: 03/04/1965   Smokeless tobacco: Never  Substance Use Topics   Alcohol use: No     ALLERGIES:   Allergies  Allergen Reactions   Varenicline Nausea Only     CURRENT MEDICATIONS:   Current Outpatient Medications  Medication Sig Dispense Refill   acetaminophen (TYLENOL) 500 MG tablet Take 1,000 mg by mouth every 6 (six) hours as needed for moderate pain.     aspirin 81 MG chewable tablet Chew 81 mg by mouth daily.     atorvastatin (LIPITOR) 80 MG tablet Take 1 tablet (80 mg total) by mouth daily at 6 PM. 30 tablet 2   calcium carbonate (OSCAL) 1500 (600 Ca) MG TABS tablet Take 600 mg of elemental calcium by mouth daily with breakfast.      cholecalciferol (VITAMIN D) 1000 UNITS tablet Take 1,000 Units by mouth daily.     clopidogrel (PLAVIX) 75 MG tablet Take 1 tablet (75 mg total) by mouth daily. 30 tablet 0   Emollient (EUCERIN ADVANCED REPAIR) CREA Apply 1 application  topically 2 (two) times daily.     etanercept (ENBREL) 50 MG/ML injection Inject 50 mg into the skin once a week.     fluticasone (FLONASE) 50  MCG/ACT nasal spray Place 2 sprays into both nostrils daily as needed for allergies or rhinitis.     furosemide (LASIX) 20 MG tablet Take 20 mg by mouth daily as needed for fluid or edema.     hydrochlorothiazide (MICROZIDE) 12.5 MG capsule Take 12.5 mg by mouth daily.     insulin glargine (SEMGLEE) 100 UNIT/ML injection Inject 75 Units into the skin daily.     leflunomide (ARAVA) 20 MG tablet Take 20 mg by mouth daily.     metoprolol succinate (TOPROL-XL) 25 MG 24 hr tablet Take 25 mg by mouth at bedtime.     morphine (MS CONTIN) 100 MG 12 hr tablet Take 100 mg by mouth every 12 (twelve) hours.     nicotine (NICODERM CQ - DOSED IN MG/24 HOURS) 21 mg/24hr patch Place 21 mg onto the skin daily.     ondansetron (ZOFRAN-ODT) 4 MG disintegrating tablet Take 1 tablet (4 mg total) by mouth every 8 (eight) hours as  needed for nausea or vomiting. 20 tablet 0   predniSONE (DELTASONE) 1 MG tablet Take 2 mg by mouth daily with breakfast.     pregabalin (LYRICA) 150 MG capsule Take 150 mg by mouth 2 (two) times daily.     promethazine (PHENERGAN) 25 MG tablet Take 25 mg by mouth every 8 (eight) hours as needed for nausea or vomiting.     promethazine (PHENERGAN) 25 MG/ML injection Inject 0.5 mLs (12.5 mg total) into the vein once as needed for nausea or vomiting. If not responding to oral promethazine. 1 mL 0   Semaglutide,0.25 or 0.5MG /DOS, (OZEMPIC, 0.25 OR 0.5 MG/DOSE,) 2 MG/1.5ML SOPN Inject 0.5 mg into the skin once a week.     Sodium Fluoride (PREVIDENT 5000 BOOSTER PLUS DT) Place 1 application  onto teeth every evening.     vitamin B-12 (CYANOCOBALAMIN) 100 MCG tablet Take 100 mcg by mouth daily.     No current facility-administered medications for this visit.    REVIEW OF SYSTEMS:   [X]  denotes positive finding, [ ]  denotes negative finding Cardiac  Comments:  Chest pain or chest pressure:    Shortness of breath upon exertion:    Short of breath when lying flat:    Irregular heart rhythm:        Vascular    Pain in calf, thigh, or hip brought on by ambulation:    Pain in feet at night that wakes you up from your sleep:     Blood clot in your veins:    Leg swelling:         Pulmonary    Oxygen at home:    Productive cough:     Wheezing:         Neurologic    Sudden weakness in arms or legs:     Sudden numbness in arms or legs:     Sudden onset of difficulty speaking or slurred speech:    Temporary loss of vision in one eye:     Problems with dizziness:         Gastrointestinal    Blood in stool:     Vomited blood:         Genitourinary    Burning when urinating:     Blood in urine:        Psychiatric    Major depression:         Hematologic    Bleeding problems:    Problems with blood clotting too easily:  Skin    Rashes or ulcers:        Constitutional    Fever  or chills:      PHYSICAL EXAM:   There were no vitals filed for this visit.  GENERAL: The patient is a well-nourished male, in no acute distress. The vital signs are documented above. CARDIAC: There is a regular rate and rhythm.  PULMONARY: Non-labored respirations ABDOMEN: Soft and non-tender  MUSCULOSKELETAL: There are no major deformities or cyanosis. NEUROLOGIC: No focal weakness or paresthesias are detected. SKIN: There are no ulcers or rashes noted. PSYCHIATRIC: The patient has a normal affect.  STUDIES:   I have reviewed the following CT angiogram of the abdomen pelvis. VASCULAR   Extensive calcified plaque along the aorta and iliac vessels with areas of significant high-grade stenosis along the iliac vessels. There is occlusion of the right SFA and high-grade stenosis on the left. Please correlate with particular symptoms.   Minimal calcified plaque along the celiac, SMA and IMA.   NON-VASCULAR   Previous areas subtle wall thickening along the distal descending colon is less apparent today. No obstruction, free air or free fluid. Scattered colonic stool.   Changes again of chronic interstitial lung disease at the lung bases.   Stable mild wall thickening of the distal esophagus.  Carotid duplex Right Carotid: Velocities in the right ICA are consistent with a 40-59%                 stenosis. The ECA appears >50% stenosed.   Left Carotid: The ECA appears >50% stenosed. Patent stent without evidence  of               stenosis.   Vertebrals: Bilateral vertebral arteries demonstrate antegrade flow.  MEDICAL ISSUES:   Carotid: The patient is status post left-sided TCAR for stroke.  He has not had any new symptoms.  Ultrasound today shows a widely patent stent with 40-59% right-sided stenosis.  The CT scan suggested a high-grade right sided stenosis, however upon my review of the scan I feel it is definitely less than 80%, and more consistent with his ultrasound  velocities.  Therefore I would not recommend intervention on the right side but rather surveillance imaging in 6 months  Abdominal pain: This was most likely secondary to colitis.  The celiac, superior mesenteric, and inferior mesenteric artery appear to be patent without high-grade stenosis on his CT scan.  Fortunately his abdominal pain has resolved  Lower extremity PAD: The patient denies any pain in the left leg.  He does not have any open wounds.  Due to travel and proximity, the patient has requested that I manage his leg issues moving forward.  I will get a duplex and ABIs in 6 months.    Charlena Cross, MD, FACS [facility] [phone] Pager [phone]

## 2022-10-17 ENCOUNTER — Other Ambulatory Visit: Payer: Self-pay

## 2022-10-17 DIAGNOSIS — I70213 Atherosclerosis of native arteries of extremities with intermittent claudication, bilateral legs: Secondary | ICD-10-CM

## 2022-10-17 DIAGNOSIS — I6523 Occlusion and stenosis of bilateral carotid arteries: Secondary | ICD-10-CM

## 2022-10-28 ENCOUNTER — Encounter: Payer: Self-pay | Admitting: Gastroenterology

## 2022-10-28 ENCOUNTER — Ambulatory Visit (INDEPENDENT_AMBULATORY_CARE_PROVIDER_SITE_OTHER): Payer: Medicare (Managed Care) | Admitting: Gastroenterology

## 2022-10-28 VITALS — BP 138/70 | HR 92 | Ht 70.0 in | Wt 164.0 lb

## 2022-10-28 DIAGNOSIS — R110 Nausea: Secondary | ICD-10-CM | POA: Diagnosis not present

## 2022-10-28 DIAGNOSIS — R1013 Epigastric pain: Secondary | ICD-10-CM

## 2022-10-28 MED ORDER — PANTOPRAZOLE SODIUM 40 MG PO TBEC: 40.0000 mg | DELAYED_RELEASE_TABLET | Freq: Every day | ORAL | 3 refills | Status: AC

## 2022-10-28 NOTE — Progress Notes (Signed)
Chief Complaint: Chronic abdominal pain Primary GI MD: Gentry Fitz (Dr. Arlyce Dice)  HPI: 72 year old male history of hypertension, diabetes type 2, PVD s/p right AKA, carotid artery stenosis, rheumatoid arthritis, CVA on DAPT (09/2022) presents for hospital follow-up.  Patient seen by PCP early June.  He underwent CT abdomen pelvis with contrast 08/13/2022 for abdominal pain.  This showed: - Mixed liquid and solid stool throughout the colon, equivocal areas of colonic enhancement in descending colon which may represent mild colitis. No wall thickening - Possible cystitis - Mild wall thickening of distal esophagus seen with esophagitis - Hepatic steatosis - Chronic interstitial lung disease  Patient was subsequently put on Augmentin for colitis.  When symptoms did not get better he presented to emergency department.  Patient recently admitted 08/20/2022 to 08/21/2022 for colitis and intractable nausea. Patient presented to the emergency department with abdominal pain (epigastric) and nausea that has been intermittent for several years.  Inpatient workup showed unrevealing lab work including CRP and WBC within normal limits.  Patient was put on cephalosporin and Flagyl and recommended to discontinue metformin.  CTA ab/pelvis w/o oral contrast 6/18  No oral contrast. Large second portion duodenal diverticulum. The stomach is nondilated. Large bowel is of normal course and caliber with moderate colonic stool. Normal appendix. Distal ileal small bowel stool appearance. On the prior examination there was a subtle area of wall thickening along the distal descending colon. This area is less apparent. This loop of bowel is nondilated.  Patient was then readmitted to the hospital 08/31/2022 to 09/02/2022 for CVA.  Patient was started on DAPT and high intensity statin.  Patient underwent outpatient left transcarotid artery revascularization 09/04/2022 that was scheduled prior to these hospital  admissions.  Patient states he has had intermittent lower abdominal pain ongoing for many years that is getting progressively worse.  He is also starting to have severe nausea that he notices becomes worse as the afternoon goes on.  Rarely has vomiting.  Phenergan helps.  Denies GERD symptoms.  Takes aspirin daily for many years.  No other NSAIDs.  Smokes 1 pack/day for 55 years.  No alcohol.  Patient has soft formed bowel movements.  Denies melena/hematochezia.  Denies weight loss.  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   PREVIOUS GI WORKUP   Colonoscopy in 2007 with Dr. Arlyce Dice showed normal exam from cecum to rectum  Past Medical History:  Diagnosis Date   Arthritis    "pretty much all over"   Cellulitis of left foot    Colitis    COPD (chronic obstructive pulmonary disease) (HCC)    Dyspnea    Hyperlipidemia    Hypertension    Nausea and vomiting 08/20/2022   Neuromuscular disorder (HCC)    neuropathy legs   Neuropathy    legs   Osteomyelitis (HCC) 04/2018   4th toe left foot   Osteomyelitis of fourth toe of left foot (HCC) 04/19/2018   Peripheral vascular disease (HCC)    Sleep apnea    does not use a cpap   Stroke (HCC)    Type II diabetes mellitus (HCC)    Wears glasses    Wears partial dentures    top and bottom partials    Past Surgical History:  Procedure Laterality Date   ABDOMINAL AORTOGRAM W/LOWER EXTREMITY Bilateral 10/19/2018   Procedure: ABDOMINAL  AORTOGRAM W/LOWER EXTREMITY;  Surgeon: [name], MD;  Location: [facility] INVASIVE CV LAB;  Service: Cardiovascular;  Laterality: Bilateral;   ABOVE KNEE LEG AMPUTATION Right 11/27/2018   AMPUTATION Left 04/22/2018   Procedure: LEFT FOOT 4TH RAY AMPUTATION;  Surgeon: [name], MD;  Location: [facility] OR;  Service: Orthopedics;  Laterality: Left;   AMPUTATION  Right 11/27/2018   Procedure: RIGHT ABOVE KNEE AMPUTATION;  Surgeon: [name], MD;  Location: [facility] OR;  Service: Orthopedics;  Laterality: Right;   BACK SURGERY     CHOLECYSTECTOMY N/A 10/26/2013   Procedure: LAPAROSCOPIC CHOLECYSTECTOMY WITH INTRAOPERATIVE CHOLANGIOGRAM;  Surgeon: Clovis Pu. Cornett, MD;  Location: [facility] OR;  Service: General;  Laterality: N/A;   COLONOSCOPY     CYST EXCISION Bilateral 02/15/2015   Procedure: LEFT INDEX FINGER AND RIGHT MIDDLE FINGER NODULE EXCISION;  Surgeon: Tarry Kos, MD;  Location: [facility];  Service: Orthopedics;  Laterality: Bilateral;   CYST EXCISION PERINEAL N/A 12/08/2012   Procedure: CYST EXCISION PERINeum;  Surgeon: Maisie Fus A. Cornett, MD;  Location: [facility];  Service: General;  Laterality: N/A;   FOOT AMPUTATION Right 2005   I & D EXTREMITY Right 03/07/2017   Procedure: EXCISION FIBULAR HEAD RIGHT BELOW KNEE AMPUTATION;  Surgeon: [name], MD;  Location: [facility] OR;  Service: Orthopedics;  Laterality: Right;   INCISE AND DRAIN ABCESS  12/02/2014   PERINEAL ABSCESS   INCISION AND DRAINAGE PERIRECTAL ABSCESS Left 12/02/2014   Procedure: IRRIGATION AND DEBRIDEMENT PERINEAL ABSCESS;  Surgeon: Abigail Miyamoto, MD;  Location: [facility] OR;  Service: General;  Laterality: Left;   LEG AMPUTATION BELOW KNEE Right 2005   LOWER EXTREMITY ANGIOGRAPHY N/A 05/05/2017   Procedure: LOWER EXTREMITY ANGIOGRAPHY;  Surgeon: [name], MD;  Location: [facility] INVASIVE CV LAB;  Service: Cardiovascular;  Laterality: N/A;   LUMBAR DISC SURGERY  82,90   ruptured disc   PERIPHERAL VASCULAR INTERVENTION Right 05/05/2017   Procedure: PERIPHERAL VASCULAR INTERVENTION;  Surgeon: [name], MD;  Location: [facility] INVASIVE CV LAB;  Service: Cardiovascular;  Laterality: Right;   PERIPHERAL VASCULAR INTERVENTION Right 10/19/2018   Procedure: PERIPHERAL VASCULAR INTERVENTION;  Surgeon: [name], MD;  Location: [facility] INVASIVE CV LAB;  Service:  Cardiovascular;  Laterality: Right;   SCAR REVISION Right 2005   @ amputation   STUMP REVISION Right 08/21/2018   Procedure: REVISION RIGHT BELOW KNEE AMPUTATION, EXCISION FIBULA;  Surgeon: [name], MD;  Location: [facility] OR;  Service: Orthopedics;  Laterality: Right;   STUMP REVISION Right 09/09/2018   Procedure: REVISION RIGHT BELOW KNEE AMPUTATION;  Surgeon: [name], MD;  Location: [facility] OR;  Service: Orthopedics;  Laterality: Right;   TOE AMPUTATION Right 2005   TRANSCAROTID ARTERY REVASCULARIZATION  Left 09/04/2022   Procedure: Left Transcarotid Artery Revascularization;  Surgeon: Nada Libman, MD;  Location: [facility] OR;  Service: Vascular;  Laterality: Left;   ULTRASOUND GUIDANCE FOR VASCULAR ACCESS Right 09/04/2022   Procedure: ULTRASOUND GUIDANCE FOR VASCULAR ACCESS, RIGHT FEMORAL VEIN;  Surgeon: Nada Libman, MD;  Location: [facility] OR;  Service: Vascular;  Laterality: Right;    Current Outpatient Medications  Medication Sig Dispense Refill   acetaminophen (TYLENOL) 500 MG tablet Take 1,000 mg by mouth every 6 (six) hours as needed for moderate pain.     aspirin 81 MG chewable tablet Chew 81 mg by mouth daily.     atorvastatin (LIPITOR) 80 MG tablet Take 1 tablet (80 mg total) by mouth  daily at 6 PM. 30 tablet 2   calcium carbonate (OSCAL) 1500 (600 Ca) MG TABS tablet Take 600 mg of elemental calcium by mouth daily with breakfast.      cholecalciferol (VITAMIN D) 1000 UNITS tablet Take 1,000 Units by mouth daily.     clopidogrel (PLAVIX) 75 MG tablet Take 1 tablet (75 mg total) by mouth daily. 30 tablet 0   Emollient (EUCERIN ADVANCED REPAIR) CREA Apply 1 application  topically 2 (two) times daily.     etanercept (ENBREL) 50 MG/ML injection Inject 50 mg into the skin once a week.     fluticasone (FLONASE) 50 MCG/ACT nasal spray Place 2 sprays into both nostrils daily as needed for allergies or rhinitis.     furosemide (LASIX) 20 MG tablet Take 20 mg by mouth daily as needed for fluid  or edema.     hydrochlorothiazide (MICROZIDE) 12.5 MG capsule Take 12.5 mg by mouth daily.     insulin glargine (SEMGLEE) 100 UNIT/ML injection Inject 75 Units into the skin daily.     leflunomide (ARAVA) 20 MG tablet Take 20 mg by mouth daily.     metoprolol succinate (TOPROL-XL) 25 MG 24 hr tablet Take 25 mg by mouth at bedtime.     morphine (MS CONTIN) 100 MG 12 hr tablet Take 100 mg by mouth every 12 (twelve) hours.     nicotine (NICODERM CQ - DOSED IN MG/24 HOURS) 21 mg/24hr patch Place 21 mg onto the skin daily.     ondansetron (ZOFRAN-ODT) 4 MG disintegrating tablet Take 1 tablet (4 mg total) by mouth every 8 (eight) hours as needed for nausea or vomiting. 20 tablet 0   predniSONE (DELTASONE) 1 MG tablet Take 2 mg by mouth daily with breakfast.     pregabalin (LYRICA) 150 MG capsule Take 150 mg by mouth 2 (two) times daily.     promethazine (PHENERGAN) 25 MG tablet Take 25 mg by mouth every 8 (eight) hours as needed for nausea or vomiting.     promethazine (PHENERGAN) 25 MG/ML injection Inject 0.5 mLs (12.5 mg total) into the vein once as needed for nausea or vomiting. If not responding to oral promethazine. 1 mL 0   Semaglutide,0.25 or 0.5MG /DOS, (OZEMPIC, 0.25 OR 0.5 MG/DOSE,) 2 MG/1.5ML SOPN Inject 0.5 mg into the skin once a week.     Sodium Fluoride (PREVIDENT 5000 BOOSTER PLUS DT) Place 1 application  onto teeth every evening.     vitamin B-12 (CYANOCOBALAMIN) 100 MCG tablet Take 100 mcg by mouth daily.     No current facility-administered medications for this visit.    Allergies as of 10/28/2022 - Review Complete 10/14/2022  Allergen Reaction Noted   Varenicline Nausea Only 08/21/2015    Family History  Problem Relation Age of Onset   Cirrhosis Father     Social History   Socioeconomic History   Marital status: Divorced    Spouse name: Not on file   Number of children: Not on file   Years of education: Not on file   Highest education level: Not on file  Occupational  History   Not on file  Tobacco Use   Smoking status: Every Day    Current packs/day: 0.50    Average packs/day: 1 pack/day for 57.7 years (57.3 ttl pk-yrs)    Types: Cigarettes    Start date: 03/04/1965   Smokeless tobacco: Never  Vaping Use   Vaping status: Never Used  Substance and Sexual Activity   Alcohol use: No  Drug use: No   Sexual activity: Not Currently  Other Topics Concern   Not on file  Social History Narrative   Not on file   Social Determinants of Health   Financial Resource Strain: Not on file  Food Insecurity: No Food Insecurity (08/20/2022)   Hunger Vital Sign    Worried About Running Out of Food in the Last Year: Never true    Ran Out of Food in the Last Year: Never true  Transportation Needs: No Transportation Needs (08/20/2022)   PRAPARE - Administrator, Civil Service (Medical): No    Lack of Transportation (Non-Medical): No  Physical Activity: Not on file  Stress: Not on file  Social Connections: Not on file  Intimate Partner Violence: Not At Risk (08/20/2022)   Humiliation, Afraid, Rape, and Kick questionnaire    Fear of Current or Ex-Partner: No    Emotionally Abused: No    Physically Abused: No    Sexually Abused: No    Review of Systems:    Constitutional: No weight loss, fever, chills, weakness or fatigue HEENT: Eyes: No change in vision               Ears, Nose, Throat:  No change in hearing or congestion Skin: No rash or itching Cardiovascular: No chest pain, chest pressure or palpitations   Respiratory: No SOB or cough Gastrointestinal: See HPI and otherwise negative Genitourinary: No dysuria or change in urinary frequency Neurological: No headache, dizziness or syncope Musculoskeletal: No new muscle or joint pain Hematologic: No bleeding or bruising Psychiatric: No history of depression or anxiety    Physical Exam:  Vital signs: There were no vitals taken for this visit.  Constitutional: No acute distress sitting  comfortably in wheelchair.  Appears older than stated age and chronically ill-appearing  head:  Normocephalic and atraumatic. Eyes:   PEERL, EOMI. No icterus. Conjunctiva pink. Respiratory: Respirations even and unlabored. Lungs clear to auscultation bilaterally.   No wheezes, crackles, or rhonchi.  Cardiovascular:  Regular rate and rhythm. No peripheral edema, cyanosis or pallor.  Gastrointestinal:  Soft, nondistended, nontender. No rebound or guarding. Normal bowel sounds. No appreciable masses or hepatomegaly. Rectal:  Not performed.  Msk:  R AKA. Neurologic:  Alert and  oriented x4;  grossly normal neurologically.  Skin:   Dry and intact without significant lesions or rashes. Psychiatric: Oriented to person, place and time. Demonstrates good judgement and reason without abnormal affect or behaviors.   RELEVANT LABS AND IMAGING: CBC    Component Value Date/Time   WBC 6.4 09/05/2022 0447   RBC 4.44 09/05/2022 0447   HGB 12.8 (L) 09/05/2022 0447   HGB 14.9 05/02/2017 1408   HCT 38.3 (L) 09/05/2022 0447   HCT 43.3 05/02/2017 1408   PLT 227 09/05/2022 0447   PLT 235 05/02/2017 1408   MCV 86.3 09/05/2022 0447   MCV 85 05/02/2017 1408   MCH 28.8 09/05/2022 0447   MCHC 33.4 09/05/2022 0447   RDW 14.7 09/05/2022 0447   RDW 14.6 05/02/2017 1408   LYMPHSABS 1.2 08/31/2022 1735   LYMPHSABS 1.4 05/02/2017 1408   MONOABS 0.7 08/31/2022 1735   EOSABS 0.3 08/31/2022 1735   EOSABS 0.2 05/02/2017 1408   BASOSABS 0.1 08/31/2022 1735   BASOSABS 0.0 05/02/2017 1408    CMP     Component Value Date/Time   NA 133 (L) 09/05/2022 0447   NA 138 05/02/2017 1408   K 3.1 (L) 09/05/2022 0447   CL 100 09/05/2022  0447   CO2 25 09/05/2022 0447   GLUCOSE 207 (H) 09/05/2022 0447   BUN <5 (L) 09/05/2022 0447   BUN 8 05/02/2017 1408   CREATININE 0.57 (L) 09/05/2022 0447   CREATININE 0.84 12/04/2017 1446   CALCIUM 7.7 (L) 09/05/2022 0447   PROT 7.1 08/31/2022 1735   PROT 6.8 08/22/2022 1326    ALBUMIN 3.4 (L) 08/31/2022 1735   ALBUMIN 4.0 08/22/2022 1326   AST 23 08/31/2022 1735   ALT 45 (H) 08/31/2022 1735   ALKPHOS 80 08/31/2022 1735   BILITOT 0.7 08/31/2022 1735   BILITOT 0.4 08/22/2022 1326   GFRNONAA >60 09/05/2022 0447   GFRAA >60 11/27/2018 0931     Assessment/Plan:   72 year old male history of hypertension, diabetes type 2, PVD s/p right AKA, carotid artery stenosis, rheumatoid arthritis, CVA on DAPT (09/2022) presents for evaluation of chronic intermittent abdominal pain and nausea and CT showing possible mild colitis.  Nausea Abdominal pain, epigastric Abnormal CT scan Suspect his epigastric pain and nausea that progresses throughout the day is secondary to esophagitis seen on CT imaging.  He is not currently on an antacid regimen and takes aspirin daily.  No previous EGD. -Trial of Protonix 40 Mg once daily - Educated patient on lifestyle modifications including not lying down 2 to 3 hours after eating and avoiding acidic foods - Patient would benefit from an EGD for further evaluation.  However, with CVA 09/2022 it would be best to wait at least 6 months before undergoing elective endoscopic procedures especially since he is on DAPT. - His nausea and pain could also be from large stool burden seen on CT.  Would recommend a fiber daily and increase water intake. - Follow-up 3 months  Screening for colonoscopy Patient is due for repeat screening colonoscopy and with abnormal CT scan showing possible mild colitis would be best to take a look endoscopically with a colonoscopy.  However, with recent CVA it is best to wait at least 6 months prior to elective endoscopic procedures. - Follow-up in 3 months and consider scheduling at that time - Patient would prefer to avoid due to difficult prep with right AKA.  We will discuss this further at follow-up   Boone Master, PA-C [facility] 10/28/2022, 8:32 AM  Cc: No ref. provider found

## 2022-10-28 NOTE — Patient Instructions (Signed)
We are providing you with a printed rx today for Protonix/pantoprazole to fill at your pharmacy.   _______________________________________________________  If your blood pressure at your visit was 140/90 or greater, please contact your primary care physician to follow up on this.  _______________________________________________________  If you are age 72 or older, your body mass index should be between 23-30. Your Body mass index is 23.53 kg/m. If this is out of the aforementioned range listed, please consider follow up with your Primary Care Provider.  If you are age 20 or younger, your body mass index should be between 19-25. Your Body mass index is 23.53 kg/m. If this is out of the aformentioned range listed, please consider follow up with your Primary Care Provider.   ________________________________________________________  The [facility] providers would like to encourage you to use [facility] to communicate with providers for non-urgent requests or questions.  Due to long hold times on the telephone, sending your provider a message by [facility] may be a faster and more efficient way to get a response.  Please allow 48 business hours for a response.  Please remember that this is for non-urgent requests.  _______________________________________________________  I appreciate the opportunity to care for you. Boone Master, PA-C

## 2022-10-30 NOTE — Progress Notes (Signed)
Agree with assessment/plan.  Raj Gupta, MD [facility] [phone]  

## 2022-11-14 ENCOUNTER — Encounter (HOSPITAL_COMMUNITY): Payer: Self-pay

## 2022-11-14 ENCOUNTER — Ambulatory Visit (HOSPITAL_COMMUNITY)
Admission: RE | Admit: 2022-11-14 | Discharge: 2022-11-14 | Disposition: A | Discharge status: Home / Self Care | Payer: Medicare (Managed Care) | Source: Ambulatory Visit | Attending: Internal Medicine | Admitting: Internal Medicine

## 2022-11-14 DIAGNOSIS — I739 Peripheral vascular disease, unspecified: Secondary | ICD-10-CM

## 2022-11-14 DIAGNOSIS — I70229 Atherosclerosis of native arteries of extremities with rest pain, unspecified extremity: Secondary | ICD-10-CM

## 2022-11-14 DIAGNOSIS — I6523 Occlusion and stenosis of bilateral carotid arteries: Secondary | ICD-10-CM

## 2022-12-16 ENCOUNTER — Ambulatory Visit: Payer: Medicare (Managed Care) | Admitting: Cardiovascular Disease

## 2023-01-20 ENCOUNTER — Encounter: Payer: Self-pay | Admitting: Gastroenterology

## 2023-01-20 ENCOUNTER — Ambulatory Visit (INDEPENDENT_AMBULATORY_CARE_PROVIDER_SITE_OTHER): Payer: Medicare (Managed Care) | Admitting: Gastroenterology

## 2023-01-20 VITALS — BP 116/62 | HR 91 | Ht 73.0 in | Wt 168.0 lb

## 2023-01-20 DIAGNOSIS — Z1211 Encounter for screening for malignant neoplasm of colon: Secondary | ICD-10-CM | POA: Diagnosis not present

## 2023-01-20 DIAGNOSIS — R110 Nausea: Secondary | ICD-10-CM

## 2023-01-20 DIAGNOSIS — R1033 Periumbilical pain: Secondary | ICD-10-CM | POA: Insufficient documentation

## 2023-01-20 DIAGNOSIS — R1084 Generalized abdominal pain: Secondary | ICD-10-CM | POA: Insufficient documentation

## 2023-01-20 MED ORDER — NA SULFATE-K SULFATE-MG SULF 17.5-3.13-1.6 GM/177ML PO SOLN: 1.0000 | Freq: Once | ORAL | 0 refills | Status: AC

## 2023-01-20 NOTE — Patient Instructions (Signed)
You have been scheduled for an endoscopy and colonoscopy. Please follow the written instructions given to you at your visit today.  Please pick up your prep supplies at the pharmacy within the next 1-3 days.  If you use inhalers (even only as needed), please bring them with you on the day of your procedure.  DO NOT TAKE 7 DAYS PRIOR TO TEST- Trulicity (dulaglutide) Ozempic, Wegovy (semaglutide) Mounjaro (tirzepatide) Bydureon Bcise (exanatide extended release)  DO NOT TAKE 1 DAY PRIOR TO YOUR TEST Rybelsus (semaglutide) Adlyxin (lixisenatide) Victoza (liraglutide) Byetta (exanatide) _______________________________________________________________________  _______________________________________________________  If your blood pressure at your visit was 140/90 or greater, please contact your primary care physician to follow up on this.  _______________________________________________________  If you are age 57 or older, your body mass index should be between 23-30. Your Body mass index is 22.16 kg/m. If this is out of the aforementioned range listed, please consider follow up with your Primary Care Provider.  If you are age 58 or younger, your body mass index should be between 19-25. Your Body mass index is 22.16 kg/m. If this is out of the aformentioned range listed, please consider follow up with your Primary Care Provider.   ________________________________________________________  The [facility] providers would like to encourage you to use [facility] to communicate with providers for non-urgent requests or questions.  Due to long hold times on the telephone, sending your provider a message by [facility] may be a faster and more efficient way to get a response.  Please allow 48 business hours for a response.  Please remember that this is for non-urgent requests.  _______________________________________________________

## 2023-01-20 NOTE — Progress Notes (Signed)
01/20/2023 FENWICK PREVATT [id number] 23-Jan-1951   HISTORY OF PRESENT ILLNESS: This is a 72 year old male who is a patient of Dr. Urban Gibson.  He has a history of hypertension, diabetes type 2, peripheral vascular disease status post right AKA, carotid artery stenosis with CVA in July on dual antiplatelet therapy, RA who is seen here for follow-up.  Was seen by one of our other PAs on 10/28/2022.  He is here today with his sister.  His main complaint is episodes of severe nausea.  It seems that they come out of nowhere, but do tend occur later in the day/afternoon.  Pain and upper/mid abdomen seems more so associated with the nausea.  Says usually just takes the Phenergan and it makes him go to sleep and wakes up the pain/nausea is gone.  He never vomits.  He had a CT scan in June that showed mild wall thickening of the distal esophagus and also suggestion of colitis.  He says that he is moving his bowels well.  No rectal bleeding.  Overall the symptoms have been at least intermittent for years.  Had a normal gastric emptying scan in 2015.  He is on Ozempic.  Is on Plavix as well.  Past Medical History:  Diagnosis Date   Arthritis    "pretty much all over"   Cellulitis of left foot    Colitis    COPD (chronic obstructive pulmonary disease) (HCC)    Dyspnea    Hx of BKA, right (HCC)    Hyperlipidemia    Hypertension    Nausea and vomiting 08/20/2022   Neuromuscular disorder (HCC)    neuropathy legs   Neuropathy    legs   Osteomyelitis (HCC) 04/2018   4th toe left foot   Osteomyelitis of fourth toe of left foot (HCC) 04/19/2018   Peripheral vascular disease (HCC)    Rheumatoid arthritis (HCC)    Sleep apnea    does not use a cpap   Stroke (HCC)    Type II diabetes mellitus (HCC)    Wears glasses    Wears partial dentures    top and bottom partials   Past Surgical History:  Procedure Laterality Date   ABDOMINAL AORTOGRAM W/LOWER EXTREMITY Bilateral 10/19/2018   Procedure:  ABDOMINAL AORTOGRAM W/LOWER EXTREMITY;  Surgeon: [name], MD;  [facility]: [facility];  Service: Cardiovascular;  Laterality: Bilateral;   ABOVE KNEE LEG AMPUTATION Right 11/27/2018   AMPUTATION Left 04/22/2018   Procedure: LEFT FOOT 4TH RAY AMPUTATION;  Surgeon: [name], MD;  Location: [facility] OR;  Service: Orthopedics;  Laterality: Left;   AMPUTATION Right 11/27/2018   Procedure: RIGHT ABOVE KNEE AMPUTATION;  Surgeon: [name], MD;  Location: [facility] OR;  Service: Orthopedics;  Laterality: Right;   BACK SURGERY     CHOLECYSTECTOMY N/A 10/26/2013   Procedure: LAPAROSCOPIC CHOLECYSTECTOMY WITH INTRAOPERATIVE CHOLANGIOGRAM;  Surgeon: Clovis Pu. Cornett, MD;  Location: [facility] OR;  Service: General;  Laterality: N/A;   COLONOSCOPY     CYST EXCISION Bilateral 02/15/2015   Procedure: LEFT INDEX FINGER AND RIGHT MIDDLE FINGER NODULE EXCISION;  Surgeon: Tarry Kos, MD;  Location: [facility];  Service: Orthopedics;  Laterality: Bilateral;   CYST EXCISION PERINEAL N/A 12/08/2012   Procedure: CYST EXCISION PERINeum;  Surgeon: Maisie Fus A. Cornett, MD;  Location: [facility];  Service: General;  Laterality: N/A;   FOOT AMPUTATION Right 2005   I & D EXTREMITY Right 03/07/2017   Procedure:  EXCISION FIBULAR HEAD RIGHT BELOW KNEE AMPUTATION;  Surgeon: [name], MD;  Location: [facility] OR;  Service: Orthopedics;  Laterality: Right;   INCISE AND DRAIN ABCESS  12/02/2014   PERINEAL ABSCESS   INCISION AND DRAINAGE PERIRECTAL ABSCESS Left 12/02/2014   Procedure: IRRIGATION AND DEBRIDEMENT PERINEAL ABSCESS;  Surgeon: Abigail Miyamoto, MD;  Location: [facility] OR;  Service: General;  Laterality: Left;   LEG AMPUTATION BELOW KNEE Right 2005   LOWER EXTREMITY ANGIOGRAPHY N/A 05/05/2017   Procedure: LOWER EXTREMITY ANGIOGRAPHY;  Surgeon: [name], MD;  [facility]: [facility];  Service: Cardiovascular;  Laterality: N/A;   LUMBAR DISC SURGERY  82,90   ruptured disc   PERIPHERAL  VASCULAR INTERVENTION Right 05/05/2017   Procedure: PERIPHERAL VASCULAR INTERVENTION;  Surgeon: [name], MD;  Location: [facility];  Service: Cardiovascular;  Laterality: Right;   PERIPHERAL VASCULAR INTERVENTION Right 10/19/2018   Procedure: PERIPHERAL VASCULAR INTERVENTION;  Surgeon: [name], MD;  [facility]: [facility];  Service: Cardiovascular;  Laterality: Right;   SCAR REVISION Right 2005   @ amputation   STUMP REVISION Right 08/21/2018   Procedure: REVISION RIGHT BELOW KNEE AMPUTATION, EXCISION FIBULA;  Surgeon: [name], MD;  Location: [facility];  Service: Orthopedics;  Laterality: Right;   STUMP REVISION Right 09/09/2018   Procedure: REVISION RIGHT BELOW KNEE AMPUTATION;  Surgeon: [name], MD;  Location: [facility] OR;  Service: Orthopedics;  Laterality: Right;   TOE AMPUTATION Right 2005   TRANSCAROTID ARTERY REVASCULARIZATION  Left 09/04/2022   Procedure: Left Transcarotid Artery Revascularization;  Surgeon: Nada Libman, MD;  Location: [facility] OR;  Service: Vascular;  Laterality: Left;   ULTRASOUND GUIDANCE FOR VASCULAR ACCESS Right 09/04/2022   Procedure: ULTRASOUND GUIDANCE FOR VASCULAR ACCESS, RIGHT FEMORAL VEIN;  Surgeon: Nada Libman, MD;  Location: [facility];  Service: Vascular;  Laterality: Right;    reports that he has been smoking cigarettes. He started smoking about 57 years ago. He has a 57.4 pack-year smoking history. He has never used smokeless tobacco. He reports that he does not drink alcohol and does not use drugs. family history includes Cirrhosis in his father; Diabetes in his maternal grandmother and mother; Hypothyroidism in his mother; Thyroid disease in his daughter. Allergies  Allergen Reactions   Varenicline Nausea Only      Outpatient Encounter Medications as of 01/20/2023  Medication Sig   acetaminophen (TYLENOL) 500 MG tablet Take 1,000 mg by mouth every 6 (six) hours as needed for moderate pain.   aspirin 81 MG chewable  tablet Chew 81 mg by mouth daily.   atorvastatin (LIPITOR) 80 MG tablet Take 1 tablet (80 mg total) by mouth daily at 6 PM.   calcium carbonate (OSCAL) 1500 (600 Ca) MG TABS tablet Take 600 mg of elemental calcium by mouth daily with breakfast.    cholecalciferol (VITAMIN D) 1000 UNITS tablet Take 1,000 Units by mouth daily.   clopidogrel (PLAVIX) 75 MG tablet Take 1 tablet (75 mg total) by mouth daily.   Emollient (EUCERIN ADVANCED REPAIR) CREA Apply 1 application  topically 2 (two) times daily.   etanercept (ENBREL) 50 MG/ML injection Inject 50 mg into the skin once a week.   fexofenadine (ALLEGRA) 180 MG tablet Take 180 mg by mouth daily.   fluticasone (FLONASE) 50 MCG/ACT nasal spray Place 2 sprays into both nostrils daily as needed for allergies or rhinitis.   furosemide (LASIX) 20 MG tablet Take 20 mg by mouth daily  as needed for fluid or edema.   hydrochlorothiazide (MICROZIDE) 12.5 MG capsule Take 12.5 mg by mouth daily.   insulin glargine (SEMGLEE) 100 UNIT/ML injection Inject 75 Units into the skin daily.   leflunomide (ARAVA) 20 MG tablet Take 20 mg by mouth daily.   liraglutide (VICTOZA) 18 MG/3ML SOPN Inject 1.8 mg into the skin daily.   metFORMIN (GLUCOPHAGE-XR) 500 MG 24 hr tablet Take 500 mg by mouth 2 (two) times daily with a meal.   metoprolol succinate (TOPROL-XL) 25 MG 24 hr tablet Take 25 mg by mouth at bedtime.   morphine (MS CONTIN) 100 MG 12 hr tablet Take 100 mg by mouth every 12 (twelve) hours.   nicotine (NICODERM CQ - DOSED IN MG/24 HOURS) 21 mg/24hr patch Place 21 mg onto the skin daily.   ondansetron (ZOFRAN-ODT) 4 MG disintegrating tablet Take 1 tablet (4 mg total) by mouth every 8 (eight) hours as needed for nausea or vomiting.   pantoprazole (PROTONIX) 40 MG tablet Take 1 tablet (40 mg total) by mouth daily before breakfast.   predniSONE (DELTASONE) 1 MG tablet Take 2 mg by mouth daily with breakfast.   pregabalin (LYRICA) 150 MG capsule Take 150 mg by mouth 2  (two) times daily.   promethazine (PHENERGAN) 25 MG tablet Take 25 mg by mouth every 8 (eight) hours as needed for nausea or vomiting.   promethazine (PHENERGAN) 25 MG/ML injection Inject 0.5 mLs (12.5 mg total) into the vein once as needed for nausea or vomiting. If not responding to oral promethazine.   Semaglutide,0.25 or 0.5MG /DOS, (OZEMPIC, 0.25 OR 0.5 MG/DOSE,) 2 MG/1.5ML SOPN Inject 0.5 mg into the skin once a week.   senna-docusate (SENOKOT-S) 8.6-50 MG tablet Take 1 tablet by mouth 2 (two) times daily as needed for mild constipation.   sodium chloride (OCEAN) 0.65 % SOLN nasal spray Place 2 sprays into both nostrils 4 (four) times daily as needed for congestion.   Sodium Fluoride (PREVIDENT 5000 BOOSTER PLUS DT) Place 1 application  onto teeth every evening.   vitamin B-12 (CYANOCOBALAMIN) 100 MCG tablet Take 100 mcg by mouth daily.   No facility-administered encounter medications on file as of 01/20/2023.    REVIEW OF SYSTEMS  : All other systems reviewed and negative except where noted in the History of Present Illness.   PHYSICAL EXAM: BP 116/62   Pulse 91   Ht 6\' 1"  (1.854 m)   Wt 168 lb (76.2 kg)   BMI 22.16 kg/m  General: Well developed white male in no acute distress Head: Normocephalic and atraumatic Eyes:  Sclerae anicteric, conjunctiva pink. Ears: Normal auditory acuity Lungs: Clear throughout to auscultation; no W/R/R. Heart: Regular rate and rhythm; no M/R/G. Rectal:  Will be done at the time of colonoscopy. Musculoskeletal: Symmetrical with no gross deformities  Skin: No lesions on visible extremities Extremities: No edema  Neurological: Alert oriented x 4, grossly non-focal Psychological:  Alert and cooperative. Normal mood and affect  ASSESSMENT AND PLAN: 72 year old male history of hypertension, diabetes type 2, PVD s/p right AKA, carotid artery stenosis, rheumatoid arthritis, CVA on DAPT (09/2022) presents for follow-up of chronic intermittent abdominal  pain/nausea.  Nausea:  This is his biggest complaint.  Occurs randomly, no vomiting.  ? Diabetic gastroparesis.  But Ozempic may be worsening symptoms as well.  Abdominal pain, epigastric:  Associated with the nausea.  Abnormal CT scan:  Esophagitis seen on CT imaging.  No previous EGD.  Started on pantoprazole 40 mg daily in August.  Really no improvement in symptoms since starting that. - Will plan for EGD in January or February with Dr. Chales Abrahams at [facility].   Screening for colonoscopy:  Patient is due for repeat screening colonoscopy and had abnormal CT scan 08/2022 showing possible mild colitis.  Patient reports normal bowel movements and no rectal bleeding.  ? Infectious colitis at the time. --Will plan for colonoscopy with Dr. Chales Abrahams as well.  Antiplatelet use with Plavix due to history of PVD and CVA:  Hold Plavix for 5 days before procedure - will instruct when and how to resume after procedure. Risks and benefits of procedure including bleeding, perforation, infection, missed lesions, medication reactions and possible hospitalization or surgery if complications occur explained. Additional rare but real risk of cardiovascular event such as heart attack or ischemia/infarct of other organs off of Plavix explained and need to seek urgent help if this occurs. Will communicate by phone or EMR with patient's prescribing provider that to confirm holding Plavix is reasonable in this case.    DM: Had a normal GES in 2015.  May need to consider repeat pending results of EGD.  Although is on ozempic so will skew results.   CC:  Reed, Tiffany L, DO

## 2023-01-23 NOTE — Progress Notes (Signed)
Agree with assessment/plan.  Raj Florestine Carmical, MD [facility] [phone]  

## 2023-01-24 ENCOUNTER — Ambulatory Visit: Payer: Medicare (Managed Care) | Admitting: Gastroenterology

## 2023-03-26 ENCOUNTER — Telehealth: Payer: Self-pay | Admitting: *Deleted

## 2023-03-26 NOTE — Telephone Encounter (Signed)
[facility] Pre-operative Risk Assessment     Request for surgical clearance:     Endoscopy Procedure  What type of surgery is being performed?     EGD/Colon  When is this surgery scheduled?     04/21/23  What type of clearance is required ?   Pharmacy  Are there any medications that need to be held prior to surgery and how long? Plavix 5 days  Practice name and name of physician performing surgery?      [facility]  What is your office phone and fax number?      Phone- [phone]  Fax- [phone]  Anesthesia type (None, local, MAC, general) ?       MAC   Please route your response to Cristela Felt, CMA

## 2023-03-26 NOTE — Telephone Encounter (Signed)
   Patient Name: Brian Zavala  DOB: 1950-06-24 MRN: [id number]  Primary Cardiologist: None  Chart reviewed as part of pre-operative protocol coverage.   Patient's Plavix is not currently managed by cardiology.  Guidance for holding should come from prescribing provider.   Napoleon Form, Leodis Rains, NP 03/26/2023, 11:13 AM

## 2023-04-02 ENCOUNTER — Telehealth: Payer: Self-pay

## 2023-04-02 ENCOUNTER — Telehealth: Payer: Self-pay | Admitting: Gastroenterology

## 2023-04-02 NOTE — Telephone Encounter (Signed)
Dr. Estanislado Spire office called stated they would like the office clearance notes to be faxed over at [phone]

## 2023-04-02 NOTE — Telephone Encounter (Signed)
Tonye Becket, NP with [facility] called stating that the pt is having an EGD on 2/17 and the MD wants to hold Plavix for 5 days prior. She is seeking confirmation if that request is agreeable.  Reviewed pt's chart, returned call for clarification, two identifiers used. Instructed her to have the GI MD's office send over a clearance request for Dr. Myra Gianotti to sign and gave her the office fax number. Confirmed understanding.

## 2023-04-03 NOTE — Telephone Encounter (Signed)
Faxed clearance request to Dr. Myra Gianotti office.

## 2023-04-03 NOTE — Telephone Encounter (Signed)
Faxed clearance request to Dr. Estanislado Spire office.

## 2023-04-07 NOTE — Progress Notes (Signed)
Fax received from [facility] on 04/03/23 for medical clearance/medication hold for EGD/colonoscopy to be signed by V.W. Myra Gianotti, MD.  Provider signed on 04/07/23, scanned into pt's chart, media routed via MyChart to sender on 04/07/23.

## 2023-04-08 NOTE — Telephone Encounter (Signed)
 Left message for patient to call office.

## 2023-04-11 NOTE — Telephone Encounter (Signed)
 Spoke with [facility] will fax clearance to their office.

## 2023-04-14 ENCOUNTER — Encounter (HOSPITAL_COMMUNITY): Payer: Self-pay | Admitting: Gastroenterology

## 2023-04-14 NOTE — Telephone Encounter (Signed)
 Clearance faxed to [facility].

## 2023-04-15 NOTE — Telephone Encounter (Signed)
Confirmed clearance received by [facility]

## 2023-04-20 ENCOUNTER — Encounter (HOSPITAL_COMMUNITY): Payer: Self-pay | Admitting: Gastroenterology

## 2023-04-20 NOTE — Anesthesia Preprocedure Evaluation (Signed)
Anesthesia Evaluation  Patient identified by MRN, date of birth, ID band Patient awake    Reviewed: Allergy & Precautions, NPO status , Patient's Chart, lab work & pertinent test results, reviewed documented beta blocker date and time   Airway Mallampati: II       Dental no notable dental hx. (+) Lower Dentures, Upper Dentures   Pulmonary shortness of breath, sleep apnea and Continuous Positive Airway Pressure Ventilation , COPD,  COPD inhaler, Current Smoker and Patient abstained from smoking.   Pulmonary exam normal breath sounds clear to auscultation       Cardiovascular hypertension, Pt. on medications and Pt. on home beta blockers + Peripheral Vascular Disease  Normal cardiovascular exam Rhythm:Regular Rate:Normal     Neuro/Psych  Neuromuscular disease CVA, Residual Symptoms  negative psych ROS   GI/Hepatic Neg liver ROS,,,N/V Abdominal pain   Endo/Other  diabetes, Poorly Controlled, Type 2, Insulin Dependent  HLD GLP-1 RA therapy- last dose 1/29  Renal/GU negative Renal ROSHypokalemia  negative genitourinary   Musculoskeletal  (+) Arthritis , Osteoarthritis,  S/P Right AKA   Abdominal   Peds  Hematology Plavix therapy- Last dose 2/12   Anesthesia Other Findings   Reproductive/Obstetrics                             Anesthesia Physical Anesthesia Plan  ASA: 2  Anesthesia Plan: MAC   Post-op Pain Management: Minimal or no pain anticipated   Induction: Intravenous  PONV Risk Score and Plan: 1 and Treatment may vary due to age or medical condition and Propofol infusion  Airway Management Planned: Natural Airway and Nasal Cannula  Additional Equipment: None  Intra-op Plan:   Post-operative Plan:   Informed Consent: I have reviewed the patients History and Physical, chart, labs and discussed the procedure including the risks, benefits and alternatives for the proposed  anesthesia with the patient or authorized representative who has indicated his/her understanding and acceptance.     Dental advisory given  Plan Discussed with: CRNA and Anesthesiologist  Anesthesia Plan Comments:         Anesthesia Quick Evaluation

## 2023-04-21 ENCOUNTER — Emergency Department (HOSPITAL_COMMUNITY)
Admission: EM | Admit: 2023-04-21 | Discharge: 2023-04-21 | Disposition: A | Discharge status: Home / Self Care | Payer: Medicare (Managed Care) | Attending: Emergency Medicine | Admitting: Emergency Medicine

## 2023-04-21 ENCOUNTER — Encounter (HOSPITAL_COMMUNITY): Payer: Self-pay

## 2023-04-21 ENCOUNTER — Ambulatory Visit (HOSPITAL_COMMUNITY)
Admission: RE | Admit: 2023-04-21 | Payer: Medicare (Managed Care) | Source: Home / Self Care | Admitting: Gastroenterology

## 2023-04-21 ENCOUNTER — Other Ambulatory Visit: Payer: Self-pay

## 2023-04-21 ENCOUNTER — Emergency Department (HOSPITAL_COMMUNITY): Payer: Self-pay | Admitting: Anesthesiology

## 2023-04-21 ENCOUNTER — Encounter (HOSPITAL_COMMUNITY)
Admission: EM | Disposition: A | Discharge status: Home / Self Care | Payer: Self-pay | Source: Home / Self Care | Attending: Emergency Medicine

## 2023-04-21 DIAGNOSIS — Z8673 Personal history of transient ischemic attack (TIA), and cerebral infarction without residual deficits: Secondary | ICD-10-CM | POA: Insufficient documentation

## 2023-04-21 DIAGNOSIS — R110 Nausea: Secondary | ICD-10-CM | POA: Diagnosis present

## 2023-04-21 DIAGNOSIS — Z7982 Long term (current) use of aspirin: Secondary | ICD-10-CM | POA: Insufficient documentation

## 2023-04-21 DIAGNOSIS — K3184 Gastroparesis: Secondary | ICD-10-CM | POA: Insufficient documentation

## 2023-04-21 DIAGNOSIS — M069 Rheumatoid arthritis, unspecified: Secondary | ICD-10-CM | POA: Insufficient documentation

## 2023-04-21 DIAGNOSIS — J449 Chronic obstructive pulmonary disease, unspecified: Secondary | ICD-10-CM | POA: Insufficient documentation

## 2023-04-21 DIAGNOSIS — F17210 Nicotine dependence, cigarettes, uncomplicated: Secondary | ICD-10-CM | POA: Diagnosis not present

## 2023-04-21 DIAGNOSIS — E785 Hyperlipidemia, unspecified: Secondary | ICD-10-CM | POA: Diagnosis not present

## 2023-04-21 DIAGNOSIS — E1151 Type 2 diabetes mellitus with diabetic peripheral angiopathy without gangrene: Secondary | ICD-10-CM | POA: Diagnosis not present

## 2023-04-21 DIAGNOSIS — Z7902 Long term (current) use of antithrombotics/antiplatelets: Secondary | ICD-10-CM | POA: Insufficient documentation

## 2023-04-21 DIAGNOSIS — K5730 Diverticulosis of large intestine without perforation or abscess without bleeding: Secondary | ICD-10-CM

## 2023-04-21 DIAGNOSIS — Z1211 Encounter for screening for malignant neoplasm of colon: Secondary | ICD-10-CM

## 2023-04-21 DIAGNOSIS — E1143 Type 2 diabetes mellitus with diabetic autonomic (poly)neuropathy: Secondary | ICD-10-CM | POA: Insufficient documentation

## 2023-04-21 DIAGNOSIS — Z7985 Long-term (current) use of injectable non-insulin antidiabetic drugs: Secondary | ICD-10-CM | POA: Insufficient documentation

## 2023-04-21 DIAGNOSIS — K2970 Gastritis, unspecified, without bleeding: Secondary | ICD-10-CM

## 2023-04-21 DIAGNOSIS — K2289 Other specified disease of esophagus: Secondary | ICD-10-CM | POA: Diagnosis not present

## 2023-04-21 DIAGNOSIS — Z7984 Long term (current) use of oral hypoglycemic drugs: Secondary | ICD-10-CM | POA: Diagnosis not present

## 2023-04-21 DIAGNOSIS — G4730 Sleep apnea, unspecified: Secondary | ICD-10-CM | POA: Diagnosis not present

## 2023-04-21 DIAGNOSIS — R109 Unspecified abdominal pain: Secondary | ICD-10-CM

## 2023-04-21 DIAGNOSIS — K640 First degree hemorrhoids: Secondary | ICD-10-CM | POA: Insufficient documentation

## 2023-04-21 DIAGNOSIS — Z794 Long term (current) use of insulin: Secondary | ICD-10-CM | POA: Diagnosis not present

## 2023-04-21 DIAGNOSIS — R1084 Generalized abdominal pain: Secondary | ICD-10-CM | POA: Insufficient documentation

## 2023-04-21 DIAGNOSIS — I10 Essential (primary) hypertension: Secondary | ICD-10-CM | POA: Diagnosis not present

## 2023-04-21 DIAGNOSIS — E876 Hypokalemia: Secondary | ICD-10-CM

## 2023-04-21 DIAGNOSIS — K3189 Other diseases of stomach and duodenum: Secondary | ICD-10-CM

## 2023-04-21 DIAGNOSIS — Z89611 Acquired absence of right leg above knee: Secondary | ICD-10-CM | POA: Diagnosis not present

## 2023-04-21 DIAGNOSIS — K2950 Unspecified chronic gastritis without bleeding: Secondary | ICD-10-CM | POA: Diagnosis not present

## 2023-04-21 HISTORY — PX: ESOPHAGOGASTRODUODENOSCOPY (EGD) WITH PROPOFOL: SHX5813

## 2023-04-21 HISTORY — PX: BIOPSY: SHX5522

## 2023-04-21 HISTORY — PX: COLONOSCOPY WITH PROPOFOL: SHX5780

## 2023-04-21 LAB — CBC WITH DIFFERENTIAL/PLATELET
Abs Immature Granulocytes: 0.07 10*3/uL (ref 0.00–0.07)
Basophils Absolute: 0.1 10*3/uL (ref 0.0–0.1)
Basophils Relative: 1 %
Eosinophils Absolute: 0.1 10*3/uL (ref 0.0–0.5)
Eosinophils Relative: 1 %
HCT: 47.7 % (ref 39.0–52.0)
Hemoglobin: 15.6 g/dL (ref 13.0–17.0)
Immature Granulocytes: 1 %
Lymphocytes Relative: 11 %
Lymphs Abs: 1.2 10*3/uL (ref 0.7–4.0)
MCH: 28.5 pg (ref 26.0–34.0)
MCHC: 32.7 g/dL (ref 30.0–36.0)
MCV: 87 fL (ref 80.0–100.0)
Monocytes Absolute: 0.8 10*3/uL (ref 0.1–1.0)
Monocytes Relative: 7 %
Neutro Abs: 8.1 10*3/uL — ABNORMAL HIGH (ref 1.7–7.7)
Neutrophils Relative %: 79 %
Platelets: 305 10*3/uL (ref 150–400)
RBC: 5.48 MIL/uL (ref 4.22–5.81)
RDW: 14.4 % (ref 11.5–15.5)
WBC: 10.4 10*3/uL (ref 4.0–10.5)
nRBC: 0 % (ref 0.0–0.2)

## 2023-04-21 LAB — COMPREHENSIVE METABOLIC PANEL
ALT: 35 U/L (ref 0–44)
AST: 36 U/L (ref 15–41)
Albumin: 4 g/dL (ref 3.5–5.0)
Alkaline Phosphatase: 85 U/L (ref 38–126)
Anion gap: 16 — ABNORMAL HIGH (ref 5–15)
BUN: 9 mg/dL (ref 8–23)
CO2: 25 mmol/L (ref 22–32)
Calcium: 9.3 mg/dL (ref 8.9–10.3)
Chloride: 97 mmol/L — ABNORMAL LOW (ref 98–111)
Creatinine, Ser: 0.6 mg/dL — ABNORMAL LOW (ref 0.61–1.24)
GFR, Estimated: >60 mL/min (ref >60)
Glucose, Bld: 174 mg/dL — ABNORMAL HIGH (ref 70–99)
Potassium: 2.8 mmol/L — ABNORMAL LOW (ref 3.5–5.1)
Sodium: 138 mmol/L (ref 135–145)
Total Bilirubin: 0.6 mg/dL (ref 0.0–1.2)
Total Protein: 8.2 g/dL — ABNORMAL HIGH (ref 6.5–8.1)

## 2023-04-21 LAB — BASIC METABOLIC PANEL
Anion gap: 12 (ref 5–15)
BUN: 8 mg/dL (ref 8–23)
CO2: 24 mmol/L (ref 22–32)
Calcium: 7.9 mg/dL — ABNORMAL LOW (ref 8.9–10.3)
Chloride: 99 mmol/L (ref 98–111)
Creatinine, Ser: 0.48 mg/dL — ABNORMAL LOW (ref 0.61–1.24)
GFR, Estimated: >60 mL/min (ref >60)
Glucose, Bld: 195 mg/dL — ABNORMAL HIGH (ref 70–99)
Potassium: 3 mmol/L — ABNORMAL LOW (ref 3.5–5.1)
Sodium: 135 mmol/L (ref 135–145)

## 2023-04-21 LAB — LIPASE, BLOOD: Lipase: 24 U/L (ref 11–51)

## 2023-04-21 LAB — MAGNESIUM: Magnesium: 2.1 mg/dL (ref 1.7–2.4)

## 2023-04-21 SURGERY — ESOPHAGOGASTRODUODENOSCOPY (EGD) WITH PROPOFOL
Anesthesia: Monitor Anesthesia Care

## 2023-04-21 MED ORDER — PROPOFOL 10 MG/ML IV BOLUS
INTRAVENOUS | Status: AC
  Filled 2023-04-21: qty 20

## 2023-04-21 MED ORDER — ONDANSETRON HCL 4 MG/2ML IJ SOLN
INTRAMUSCULAR | Status: DC | PRN
Start: 2023-04-21 — End: 2023-04-21
  Administered 2023-04-21: 4 mg via INTRAVENOUS

## 2023-04-21 MED ORDER — EPHEDRINE SULFATE (PRESSORS) 50 MG/ML IJ SOLN
INTRAMUSCULAR | Status: DC | PRN
  Administered 2023-04-21: 5 mg via INTRAVENOUS
  Administered 2023-04-21: 10 mg via INTRAVENOUS
  Administered 2023-04-21: 5 mg via INTRAVENOUS

## 2023-04-21 MED ORDER — ONDANSETRON HCL 4 MG/2ML IJ SOLN
4.0000 mg | Freq: Once | INTRAMUSCULAR | Status: AC
  Administered 2023-04-21: 4 mg via INTRAVENOUS
  Filled 2023-04-21: qty 2

## 2023-04-21 MED ORDER — SODIUM CHLORIDE 0.9 % IV BOLUS
1000.0000 mL | Freq: Once | INTRAVENOUS | Status: AC
  Administered 2023-04-21: 1000 mL via INTRAVENOUS

## 2023-04-21 MED ORDER — PROPOFOL 500 MG/50ML IV EMUL
INTRAVENOUS | Status: DC | PRN
  Administered 2023-04-21: 125 ug/kg/min via INTRAVENOUS
  Administered 2023-04-21: 40 mg via INTRAVENOUS

## 2023-04-21 MED ORDER — PHENYLEPHRINE HCL (PRESSORS) 10 MG/ML IV SOLN
INTRAVENOUS | Status: DC | PRN
Start: 2023-04-21 — End: 2023-04-21
  Administered 2023-04-21 (×2): 160 ug via INTRAVENOUS
  Administered 2023-04-21 (×2): 80 ug via INTRAVENOUS
  Administered 2023-04-21: 160 ug via INTRAVENOUS
  Administered 2023-04-21: 80 ug via INTRAVENOUS
  Administered 2023-04-21 (×4): 160 ug via INTRAVENOUS

## 2023-04-21 MED ORDER — POTASSIUM CHLORIDE CRYS ER 20 MEQ PO TBCR
40.0000 meq | EXTENDED_RELEASE_TABLET | Freq: Once | ORAL | Status: AC
  Administered 2023-04-21: 40 meq via ORAL
  Filled 2023-04-21: qty 2

## 2023-04-21 MED ORDER — PROPOFOL 500 MG/50ML IV EMUL
INTRAVENOUS | Status: AC
Start: 2023-04-21 — End: ?
  Filled 2023-04-21: qty 50

## 2023-04-21 MED ORDER — POTASSIUM CHLORIDE 10 MEQ/100ML IV SOLN
10.0000 meq | INTRAVENOUS | Status: AC
  Administered 2023-04-21: 10 meq via INTRAVENOUS
  Filled 2023-04-21 (×2): qty 100

## 2023-04-21 MED ORDER — SODIUM CHLORIDE 0.9 % IV SOLN: INTRAVENOUS | Status: DC | PRN

## 2023-04-21 SURGICAL SUPPLY — 23 items
BLOCK BITE 60FR ADLT L/F BLUE (MISCELLANEOUS) ×2 IMPLANT
ELECT REM PT RETURN 9FT ADLT (ELECTROSURGICAL)
ELECTRODE REM PT RTRN 9FT ADLT (ELECTROSURGICAL) IMPLANT
FLOOR PAD 36X40 (MISCELLANEOUS) ×2
FORCEP RJ3 GP 1.8X160 W-NEEDLE (CUTTING FORCEPS) IMPLANT
FORCEPS BIOP RAD 4 LRG CAP 4 (CUTTING FORCEPS) IMPLANT
FORCEPS BIOP RJ4 240 W/NDL (CUTTING FORCEPS)
FORCEPS BXJMBJMB 240X2.8X (CUTTING FORCEPS) IMPLANT
INJECTOR/SNARE I SNARE (MISCELLANEOUS) IMPLANT
LUBRICANT JELLY 4.5OZ STERILE (MISCELLANEOUS) IMPLANT
MANIFOLD NEPTUNE II (INSTRUMENTS) IMPLANT
NDL SCLEROTHERAPY 25GX240 (NEEDLE) IMPLANT
NEEDLE SCLEROTHERAPY 25GX240 (NEEDLE)
PAD FLOOR 36X40 (MISCELLANEOUS) ×2 IMPLANT
PROBE APC STR FIRE (PROBE) IMPLANT
PROBE INJECTION GOLD 7FR (MISCELLANEOUS) IMPLANT
SNARE ROTATE MED OVAL 20MM (MISCELLANEOUS) IMPLANT
SNARE SHORT THROW 13M SML OVAL (MISCELLANEOUS) IMPLANT
SYR 50ML LL SCALE MARK (SYRINGE) IMPLANT
TRAP SPECIMEN MUCOUS 40CC (MISCELLANEOUS) IMPLANT
TUBING ENDO SMARTCAP PENTAX (MISCELLANEOUS) ×4 IMPLANT
TUBING IRRIGATION ENDOGATOR (MISCELLANEOUS) ×2 IMPLANT
WATER STERILE IRR 1000ML POUR (IV SOLUTION) IMPLANT

## 2023-04-21 NOTE — Discharge Instructions (Addendum)
You were seen in the emergency room today for nausea vomiting diarrhea.  Your potassium was low but has responded appropriately to IV potassium.  You are given oral potassium as well.  Please have potassium rechecked tomorrow as discussed.  I recommend Zofran as needed for nausea.  You can also try bland foods like soup, toast.  If you are no longer tolerating oral intake please return to ER.

## 2023-04-21 NOTE — Op Note (Signed)
[facility] Patient Name: Brian Zavala Procedure Date: 04/21/2023 MRN: [id number] Attending MD: Lynann Bologna , MD, [id number] Date of Birth: [birth year] CSN: [id number] Age: 73 Admit Type: Inpatient Procedure:                Upper GI endoscopy Indications:              Epigastric abdominal pain with H/O N/V- pt has been                            on GLP-1 as well. Providers:                Lynann Bologna, MD, Norman Clay, RN, Rozetta Nunnery,                            Technician Referring MD:              Medicines:                 Complications:            No immediate complications. Estimated Blood Loss:     Estimated blood loss: none. Procedure:                Pre-Anesthesia Assessment:                           - Prior to the procedure, a History and Physical                            was performed, and patient medications and                            allergies were reviewed. The patient's tolerance of                            previous anesthesia was also reviewed. The risks                            and benefits of the procedure and the sedation                            options and risks were discussed with the patient.                            All questions were answered, and informed consent                            was obtained. Prior Anticoagulants: The patient has                            taken Plavix (clopidogrel), last dose was 5 days                            prior to procedure, bASA was continued. ASA Grade  Assessment: III - A patient with severe systemic                            disease. After reviewing the risks and benefits,                            the patient was deemed in satisfactory condition to                            undergo the procedure.                           After obtaining informed consent, the endoscope was                            passed under direct vision. Throughout the                             procedure, the patient's blood pressure, pulse, and                            oxygen saturations were monitored continuously. The                            [id number]-[id number] ([id number]) Olympus endoscope was introduced                            through the mouth, and advanced to the second part                            of duodenum. The upper GI endoscopy was                            accomplished without difficulty. The patient                            tolerated the procedure well. Scope In: Scope Out: Findings:      The examined esophagus was normal.      The Z-line was irregular. Biopsies were taken with a cold forceps for       histology.      Diffuse moderate inflammation characterized by congestion (edema),       erosions and erythema was found in the gastric body and in the gastric       antrum with stellate antral scar consistent with previous healed ulcers.       Biopsies were taken with a cold forceps for histology.      The examined duodenum was normal. Estimated blood loss: none. Impression:               - Normal esophagus.                           - Z-line irregular. Biopsied.                           - Gastritis. Biopsied.                           -  Normal examined duodenum. Moderate Sedation:      Not Applicable - Patient had care per Anesthesia. Recommendation:           - Patient has a contact number available for                            emergencies. The signs and symptoms of potential                            delayed complications were discussed with the                            patient. Return to normal activities tomorrow.                            Written discharge instructions were provided to the                            patient.                           - Resume previous diet.                           - Continue present medications including Protonix                            40 mg p.o. daily #90.                           - Resume  Plavix (clopidogrel) at prior dose in 3                            days.                           - The findings and recommendations were discussed                            with the patient's family. Procedure Code(s):        --- Professional ---                           [phone], Esophagogastroduodenoscopy, flexible,                            transoral; with biopsy, single or multiple Diagnosis Code(s):        --- Professional ---                           K22.89, Other specified disease of esophagus                           K29.70, Gastritis, unspecified, without bleeding                           R10.13, Epigastric pain CPT  copyright 2022 [employer]. All rights reserved. The codes documented in this report are preliminary and upon coder review may  be revised to meet current compliance requirements. Lynann Bologna, MD 04/21/2023 9:43:12 AM This report has been signed electronically. Number of Addenda: 0

## 2023-04-21 NOTE — ED Provider Notes (Signed)
[place] EMERGENCY DEPARTMENT AT [facility] Provider Note   CSN: [id number] Arrival date & time: 04/21/23  0359     History  Chief Complaint  Patient presents with   Abdominal Pain    Brian Zavala is a 73 y.o. male.  73 year old male with a history of hypertension, hyperlipidemia, COPD, diabetes, nausea and vomiting presents to the emergency department for complaints of generalized abdominal discomfort which began after the second course of bowel prep for his anticipated colonoscopy this morning.  Took his second bowel prep around 2 AM after which time his abdominal cramping began.  He has been experiencing nausea, but this is not uncommon for the patient.  He has not had any vomiting.  Has been having frequent stooling without melena or hematochezia.  No associated fevers.  GI - Dr. Chales Abrahams  The history is provided by the patient. No language interpreter was used.  Abdominal Pain      Home Medications Prior to Admission medications   Medication Sig Start Date End Date Taking? Authorizing Provider  acetaminophen (TYLENOL) 500 MG tablet Take 1,000 mg by mouth every 6 (six) hours as needed for moderate pain.    [provider]  aspirin 81 MG chewable tablet Chew 81 mg by mouth daily.    [provider]  atorvastatin (LIPITOR) 80 MG tablet Take 1 tablet (80 mg total) by mouth daily at 6 PM. 05/06/17   Arty Baumgartner, NP  calcium carbonate (OSCAL) 1500 (600 Ca) MG TABS tablet Take 600 mg of elemental calcium by mouth daily with breakfast.     [provider]  cholecalciferol (VITAMIN D) 1000 UNITS tablet Take 1,000 Units by mouth daily.    [provider]  clopidogrel (PLAVIX) 75 MG tablet Take 1 tablet (75 mg total) by mouth daily. 09/02/22   Leroy Sea, MD  Emollient (EUCERIN ADVANCED REPAIR) CREA Apply 1 application  topically 2 (two) times daily.    [provider]  etanercept (ENBREL) 50 MG/ML injection Inject  50 mg into the skin once a week.    [provider]  fexofenadine (ALLEGRA) 180 MG tablet Take 180 mg by mouth daily.    [provider]  fluticasone (FLONASE) 50 MCG/ACT nasal spray Place 2 sprays into both nostrils daily as needed for allergies or rhinitis.    [provider]  furosemide (LASIX) 20 MG tablet Take 20 mg by mouth daily as needed for fluid or edema.    [provider]  hydrochlorothiazide (MICROZIDE) 12.5 MG capsule Take 12.5 mg by mouth daily.    [provider]  insulin glargine (SEMGLEE) 100 UNIT/ML injection Inject 75 Units into the skin daily.    [provider]  leflunomide (ARAVA) 20 MG tablet Take 20 mg by mouth daily.    [provider]  liraglutide (VICTOZA) 18 MG/3ML SOPN Inject 1.8 mg into the skin daily.    [provider]  metFORMIN (GLUCOPHAGE-XR) 500 MG 24 hr tablet Take 500 mg by mouth 2 (two) times daily with a meal.    [provider]  metoprolol succinate (TOPROL-XL) 25 MG 24 hr tablet Take 25 mg by mouth at bedtime.    [provider]  morphine (MS CONTIN) 100 MG 12 hr tablet Take 100 mg by mouth every 12 (twelve) hours.    [provider]  nicotine (NICODERM CQ - DOSED IN MG/24 HOURS) 21 mg/24hr patch Place 21 mg onto the skin daily.  [provider]  ondansetron (ZOFRAN-ODT) 4 MG disintegrating tablet Take 1 tablet (4 mg total) by mouth every 8 (eight) hours as needed for nausea or vomiting. 08/21/22   Tyrone Nine, MD  pantoprazole (PROTONIX) 40 MG tablet Take 1 tablet (40 mg total) by mouth daily before breakfast. 10/28/22   McMichael, Bayley M, PA-C  predniSONE (DELTASONE) 1 MG tablet Take 2 mg by mouth daily with breakfast.    [provider]  pregabalin (LYRICA) 150 MG capsule Take 150 mg by mouth 2 (two) times daily.    [provider]  promethazine (PHENERGAN) 25 MG tablet Take 25 mg by mouth every 8 (eight) hours as needed for  nausea or vomiting.    [provider]  promethazine (PHENERGAN) 25 MG/ML injection Inject 0.5 mLs (12.5 mg total) into the vein once as needed for nausea or vomiting. If not responding to oral promethazine. 08/21/22   Tyrone Nine, MD  Semaglutide,0.25 or 0.5MG /DOS, (OZEMPIC, 0.25 OR 0.5 MG/DOSE,) 2 MG/1.5ML SOPN Inject 0.5 mg into the skin once a week. Patient not taking: Reported on 04/14/2023    [provider]  senna-docusate (SENOKOT-S) 8.6-50 MG tablet Take 1 tablet by mouth 2 (two) times daily as needed for mild constipation.    [provider]  sodium chloride (OCEAN) 0.65 % SOLN nasal spray Place 2 sprays into both nostrils 4 (four) times daily as needed for congestion.    [provider]  Sodium Fluoride (PREVIDENT 5000 BOOSTER PLUS DT) Place 1 application  onto teeth every evening.    [provider]  vitamin B-12 (CYANOCOBALAMIN) 100 MCG tablet Take 100 mcg by mouth daily.    [provider]      Allergies    Varenicline    Review of Systems   Review of Systems  Gastrointestinal:  Positive for abdominal pain.  Ten systems reviewed and are negative for acute change, except as noted in the HPI.    Physical Exam Updated Vital Signs BP (!) 143/78 (BP Location: Left Arm)   Pulse (!) 107   Temp 97.8 F (36.6 C) (Oral)   Resp 16   Ht 6\' 1"  (1.854 m)   Wt 75 kg   SpO2 93%   BMI 21.81 kg/m   Physical Exam Vitals and nursing note reviewed.  Constitutional:      General: He is not in acute distress.    Appearance: He is well-developed. He is not diaphoretic.     Comments: Ill appearing, but nontoxic.  HENT:     Head: Normocephalic and atraumatic.  Eyes:     General: No scleral icterus.    Conjunctiva/sclera: Conjunctivae normal.  Cardiovascular:     Rate and Rhythm: Normal rate and regular rhythm.     Pulses: Normal pulses.  Pulmonary:     Effort: Pulmonary effort is normal. No respiratory distress.     Comments:  Respirations even and unlabored Abdominal:     Comments: Mild generalized abdominal TTP with distension. Abdomen soft. No guarding or peritoneal signs.   Musculoskeletal:        General: Normal range of motion.     Cervical back: Normal range of motion.     Comments: Right BKA  Skin:    General: Skin is warm and dry.     Coloration: Skin is not pale.     Findings: No erythema or rash.  Neurological:     Mental Status: He is alert and oriented to person, place, and  time.     Coordination: Coordination normal.  Psychiatric:        Behavior: Behavior normal.     ED Results / Procedures / Treatments   Labs (all labs ordered are listed, but only abnormal results are displayed) Labs Reviewed  CBC WITH DIFFERENTIAL/PLATELET - Abnormal; Notable for the following components:      Result Value   Neutro Abs 8.1 (*)    All other components within normal limits  COMPREHENSIVE METABOLIC PANEL - Abnormal; Notable for the following components:   Potassium 2.8 (*)    Chloride 97 (*)    Glucose, Bld 174 (*)    Creatinine, Ser 0.60 (*)    Total Protein 8.2 (*)    Anion gap 16 (*)    All other components within normal limits  LIPASE, BLOOD  MAGNESIUM    EKG None  Radiology No results found.  Procedures Procedures    Medications Ordered in ED Medications  potassium chloride 10 mEq in 100 mL IVPB (10 mEq Intravenous New Bag/Given 04/21/23 1914)  sodium chloride 0.9 % bolus 1,000 mL (0 mLs Intravenous Stopped 04/21/23 0700)  ondansetron (ZOFRAN) injection 4 mg (4 mg Intravenous Given 04/21/23 0520)    ED Course/ Medical Decision Making/ A&P                                 Medical Decision Making Amount and/or Complexity of Data Reviewed Labs: ordered.  Risk Prescription drug management.   This patient presents to the ED for concern of abdominal cramping and nausea, this involves an extensive number of treatment options, and is a complaint that carries with it a high risk of  complications and morbidity.  The differential diagnosis includes acute on chronic pain vs electrolyte derangement vs pSBO/SBO vs entero/colitis vs IBS vs IBD vs ruptured viscous.   Co morbidities that complicate the patient evaluation  HTN HLD COPD DM   Additional history obtained:  Additional history obtained from family, at bedside, as well as EMS personnel External records from outside source obtained and reviewed including CT abdomen pelvis and CTA abdomen pelvis from 08/13/22 and 08/20/22, respectively. Relatively stable with findings questionable for mild colitis.   Lab Tests:  I Ordered, and personally interpreted labs.  The pertinent results include:  K 2.8, Glucose 174.   Cardiac Monitoring:  The patient was maintained on a cardiac monitor.  I personally viewed and interpreted the cardiac monitored which showed an underlying rhythm of: sinus tachycardia > NSR   Medicines ordered and prescription drug management:  I ordered medication including Zofran for nausea and IV potassium for hypokalemia  Reevaluation of the patient after these medicines showed that the patient  remained stable I have reviewed the patients home medicines and have made adjustments as needed   Test Considered:  CT abdomen/pelvis - felt low yield given lack of focal TTP, absence of peritoneal signs. No fever or leukocytosis. Kidney function and LFTs preserved. Hx of chronic abdominal pain.   Problem List / ED Course:  73 year old male presents to the emergency department for evaluation of abdominal pain cramping with nausea.  Symptoms began after the second prep for his colonoscopy which he took around 2 AM. No focal abdominal tenderness on exam.  No peritoneal signs, palpable masses.  While he appears ill and uncomfortable, he is nontoxic.  No associated fevers or leukocytosis.  Low suspicion for infectious process.  He has had similar  episodes of chronic abdominal pain previously.  This is one  of the reasons for his scheduled endoscopy and colonoscopy today. Noted to be hypokalemic.  Presently receiving IV potassium for repletion.  Labs otherwise in keeping with baseline.  Plan to repeat potassium after completion of IV potassium replacement.  Will attempt to discharge to Endo suite for scheduled GI procedure if electrolytes improving. [facility] providers notified of patient's presence in ED via Epic Secure Chat.   Reevaluation:  After the interventions noted above, I reevaluated the patient and found that they have : remained stable   Social Determinants of Health:  Right BKA; necessitates use of assist device   Dispostion:  Care signed out to Barrett, PA-C at change of shift.         Final Clinical Impression(s) / ED Diagnoses Final diagnoses:  Hypokalemia  Abdominal cramping    Rx / DC Orders ED Discharge Orders     None         Antony Madura, PA-C 04/21/23 0454    Nira Conn, MD 04/21/23 [phone]

## 2023-04-21 NOTE — ED Provider Notes (Signed)
  Physical Exam  BP (!) 143/78 (BP Location: Left Arm)   Pulse (!) 107   Temp 97.8 F (36.6 C) (Oral)   Resp 16   Ht 6\' 1"  (1.854 m)   Wt 75 kg   SpO2 93%   BMI 21.81 kg/m   Physical Exam Constitutional:      Appearance: He is normal weight.  HENT:     Head: Normocephalic and atraumatic.  Cardiovascular:     Rate and Rhythm: Normal rate and regular rhythm.  Pulmonary:     Effort: Pulmonary effort is normal.     Breath sounds: Normal breath sounds.  Neurological:     Mental Status: He is alert.     Procedures  Procedures  ED Course / MDM    Medical Decision Making Amount and/or Complexity of Data Reviewed Labs: ordered.  Risk Prescription drug management.   Patient was received in sign off.  In short he was scheduled to have colonoscopy this morning and after taking bowel prep he experienced nausea vomiting and diarrhea. Prior Provider messaged GI on-call, waiting to here back.   He is here for IV potassium replacement and fluids.  Current plan is to replace potassium, recheck potassium once this is finished, if corrected, discharge for endoscopy.   Patient was seen by GI doctor.  They report he is okay to have endoscopy.  He was taken to endoscopy.   Patient returned from endoscopy.  Did recheck potassium which is now 3.0.  Patient is not tolerating oral intake.  I will supplement potassium orally.  His care team is at bedside and states they can recheck this potassium tomorrow and supplement as needed.  Now he is tolerating oral intake and has improvement in symptoms and potassium feel he is stable for discharge.       Brian Zavala 04/21/23 1205    Alvira Monday, MD 04/22/23 Marlyne Beards

## 2023-04-21 NOTE — Transfer of Care (Signed)
Immediate Anesthesia Transfer of Care Note  Patient: Brian Zavala  Procedure(s) Performed: ESOPHAGOGASTRODUODENOSCOPY (EGD) WITH PROPOFOL COLONOSCOPY WITH PROPOFOL BIOPSY  Patient Location: PACU  Anesthesia Type:MAC  Level of Consciousness: drowsy and patient cooperative  Airway & Oxygen Therapy: Patient Spontanous Breathing and Patient connected to face mask oxygen  Post-op Assessment: Report given to RN and Post -op Vital signs reviewed and stable  Post vital signs: Reviewed and stable  Last Vitals:  Vitals Value Taken Time  BP 92/52 04/21/23 0939  Temp    Pulse 90 04/21/23 0940  Resp 18 04/21/23 0940  SpO2 100 % 04/21/23 0940  Vitals shown include unfiled device data.  Last Pain:  Vitals:   04/21/23 0820  TempSrc: Temporal  PainSc: 0-No pain         Complications: No notable events documented.

## 2023-04-21 NOTE — Anesthesia Postprocedure Evaluation (Signed)
Anesthesia Post Note  Patient: Brian Zavala  Procedure(s) Performed: ESOPHAGOGASTRODUODENOSCOPY (EGD) WITH PROPOFOL COLONOSCOPY WITH PROPOFOL BIOPSY     Patient location during evaluation: PACU Anesthesia Type: MAC Level of consciousness: awake and alert and oriented Pain management: pain level controlled Vital Signs Assessment: post-procedure vital signs reviewed and stable Respiratory status: spontaneous breathing, nonlabored ventilation and respiratory function stable Cardiovascular status: stable and blood pressure returned to baseline Postop Assessment: no apparent nausea or vomiting Anesthetic complications: no   No notable events documented.  Last Vitals:  Vitals:   04/21/23 0956 04/21/23 1000  BP: (!) 91/58 (!) 101/57  Pulse: 100 96  Resp: (!) 22 17  Temp:    SpO2: 100% 95%    Last Pain:  Vitals:   04/21/23 1000  TempSrc:   PainSc: 0-No pain                 Guled Gahan A.

## 2023-04-21 NOTE — Op Note (Signed)
[facility] Patient Name: Brian Zavala Procedure Date: 04/21/2023 MRN: [id number] Attending MD: Lynann Bologna , MD, [id number] Date of Birth: 02-09-1951 CSN: [id number] Age: 73 Admit Type: Outpatient Procedure:                Colonoscopy Indications:              Screening for colorectal malignant neoplasm Providers:                Lynann Bologna, MD, Norman Clay, RN, Rozetta Nunnery,                            Technician Referring MD:              Medicines:                Monitored Anesthesia Care Complications:            No immediate complications. Estimated Blood Loss:     Estimated blood loss: none. Procedure:                Pre-Anesthesia Assessment:                           - Prior to the procedure, a History and Physical                            was performed, and patient medications and                            allergies were reviewed. The patient's tolerance of                            previous anesthesia was also reviewed. The risks                            and benefits of the procedure and the sedation                            options and risks were discussed with the patient.                            All questions were answered, and informed consent                            was obtained. Prior Anticoagulants: Plavix was held                            5 days prior. ASA Grade Assessment: III - A patient                            with severe systemic disease. After reviewing the                            risks and benefits, the patient was deemed in  satisfactory condition to undergo the procedure.                           After obtaining informed consent, the colonoscope                            was passed under direct vision. Throughout the                            procedure, the patient's blood pressure, pulse, and                            oxygen saturations were monitored continuously. The                             [id number]-HQ190L ([id number]) Olympus colonoscope was                            introduced through the anus and advanced to the 1                            cm into the ileum. The colonoscopy was performed                            without difficulty. The patient tolerated the                            procedure well. The quality of the bowel                            preparation was good. The terminal ileum, ileocecal                            valve, appendiceal orifice, and rectum were                            photographed. Scope In: 9:18:44 AM Scope Out: 9:32:25 AM Scope Withdrawal Time: 0 hours 10 minutes 6 seconds  Total Procedure Duration: 0 hours 13 minutes 41 seconds  Findings:      A few small-mouthed diverticula were found in the sigmoid colon.      Non-bleeding internal hemorrhoids were found during retroflexion. The       hemorrhoids were small and Grade I (internal hemorrhoids that do not       prolapse).      The terminal ileum appeared normal.      The exam was otherwise without abnormality on direct and retroflexion       views.      The entire examined colon appeared normal. Impression:               - Mild sigmoid diverticulosis.                           - Non-bleeding internal hemorrhoids.                           -  The examined portion of the ileum was normal.                           - The examination was otherwise normal on direct                            and retroflexion views.                           - The entire examined colon is normal.                           - No specimens collected. Moderate Sedation:      Not Applicable - Patient had care per Anesthesia. Recommendation:           - Patient has a contact number available for                            emergencies. The signs and symptoms of potential                            delayed complications were discussed with the                            patient. Return to normal activities  tomorrow.                            Written discharge instructions were provided to the                            patient.                           - Resume previous diet.                           - Continue present medications.                           - Repeat colonoscopy is not recommended for                            screening purposes.                           - Resume Plavix as per EGD note.                           - Replace potassium and recheck.                           - Return to GI clinic PRN.                           - D/W pt and family Procedure Code(s):        --- Professional ---  G4010, Colorectal cancer screening; colonoscopy on                            individual not meeting criteria for high risk Diagnosis Code(s):        --- Professional ---                           Z12.11, Encounter for screening for malignant                            neoplasm of colon                           K64.0, First degree hemorrhoids                           K57.30, Diverticulosis of large intestine without                            perforation or abscess without bleeding CPT copyright 2022 [employer]. All rights reserved. The codes documented in this report are preliminary and upon coder review may  be revised to meet current compliance requirements. Lynann Bologna, MD 04/21/2023 9:36:47 AM This report has been signed electronically. Number of Addenda: 0

## 2023-04-21 NOTE — H&P (Signed)
01/20/2023 [facility] [id number] 06/27/1950     HISTORY OF PRESENT ILLNESS: This is a 73 year old male who is a patient of Dr. Urban Gibson.  He has a history of hypertension, diabetes type 2, peripheral vascular disease status post right AKA, carotid artery stenosis with CVA in July on dual antiplatelet therapy, RA who is seen here for follow-up.  Was seen by one of our other PAs on 10/28/2022.  He is here today with his sister.  His main complaint is episodes of severe nausea.  It seems that they come out of nowhere, but do tend occur later in the day/afternoon.  Pain and upper/mid abdomen seems more so associated with the nausea.  Says usually just takes the Phenergan and it makes him go to sleep and wakes up the pain/nausea is gone.  He never vomits.  He had a CT scan in June that showed mild wall thickening of the distal esophagus and also suggestion of colitis.  He says that he is moving his bowels well.  No rectal bleeding.  Overall the symptoms have been at least intermittent for years.  Had a normal gastric emptying scan in 2015.  He is on Ozempic.  Is on Plavix as well.       Past Medical History:  Diagnosis Date   Arthritis      "pretty much all over"   Cellulitis of left foot     Colitis     COPD (chronic obstructive pulmonary disease) (HCC)     Dyspnea     Hx of BKA, right (HCC)     Hyperlipidemia     Hypertension     Nausea and vomiting 08/20/2022   Neuromuscular disorder (HCC)      neuropathy legs   Neuropathy      legs   Osteomyelitis (HCC) 04/2018    4th toe left foot   Osteomyelitis of fourth toe of left foot (HCC) 04/19/2018   Peripheral vascular disease (HCC)     Rheumatoid arthritis (HCC)     Sleep apnea      does not use a cpap   Stroke (HCC)     Type II diabetes mellitus (HCC)     Wears glasses     Wears partial dentures      top and bottom partials             Past Surgical History:  Procedure Laterality Date   ABDOMINAL AORTOGRAM W/LOWER  EXTREMITY Bilateral 10/19/2018    Procedure: ABDOMINAL AORTOGRAM W/LOWER EXTREMITY;  Surgeon: [name], MD;  Location: [facility];  Service: Cardiovascular;  Laterality: Bilateral;   ABOVE KNEE LEG AMPUTATION Right 11/27/2018   AMPUTATION Left 04/22/2018    Procedure: LEFT FOOT 4TH RAY AMPUTATION;  Surgeon: [name], MD;  Location: [facility] OR;  Service: Orthopedics;  Laterality: Left;   AMPUTATION Right 11/27/2018    Procedure: RIGHT ABOVE KNEE AMPUTATION;  Surgeon: [name], MD;  Location: [facility] OR;  Service: Orthopedics;  Laterality: Right;   BACK SURGERY       CHOLECYSTECTOMY N/A 10/26/2013    Procedure: LAPAROSCOPIC CHOLECYSTECTOMY WITH INTRAOPERATIVE CHOLANGIOGRAM;  Surgeon: Clovis Pu. Cornett, MD;  Location: [facility];  Service: General;  Laterality: N/A;   COLONOSCOPY       CYST EXCISION Bilateral 02/15/2015    Procedure: LEFT INDEX FINGER AND RIGHT MIDDLE FINGER NODULE EXCISION;  Surgeon: Tarry Kos, MD;  Location: [facility];  Service: Orthopedics;  Laterality: Bilateral;   CYST EXCISION PERINEAL N/A 12/08/2012    Procedure: CYST EXCISION PERINeum;  Surgeon: Maisie Fus A. Cornett, MD;  Location: [facility];  Service: General;  Laterality: N/A;   FOOT AMPUTATION Right 2005   I & D EXTREMITY Right 03/07/2017    Procedure: EXCISION FIBULAR HEAD RIGHT BELOW KNEE AMPUTATION;  Surgeon: [name], MD;  Location: [facility];  Service: Orthopedics;  Laterality: Right;   INCISE AND DRAIN ABCESS   12/02/2014    PERINEAL ABSCESS   INCISION AND DRAINAGE PERIRECTAL ABSCESS Left 12/02/2014    Procedure: IRRIGATION AND DEBRIDEMENT PERINEAL ABSCESS;  Surgeon: Abigail Miyamoto, MD;  Location: [facility];  Service: General;  Laterality: Left;   LEG AMPUTATION BELOW KNEE Right 2005   LOWER EXTREMITY ANGIOGRAPHY N/A 05/05/2017    Procedure: LOWER EXTREMITY ANGIOGRAPHY;  Surgeon: [name], MD;  [facility]: [facility];  Service: Cardiovascular;  Laterality: N/A;    LUMBAR DISC SURGERY   82,90    ruptured disc   PERIPHERAL VASCULAR INTERVENTION Right 05/05/2017    Procedure: PERIPHERAL VASCULAR INTERVENTION;  Surgeon: [name], MD;  [facility]: [facility];  Service: Cardiovascular;  Laterality: Right;   PERIPHERAL VASCULAR INTERVENTION Right 10/19/2018    Procedure: PERIPHERAL VASCULAR INTERVENTION;  Surgeon: [name], MD;  [facility];  Service: Cardiovascular;  Laterality: Right;   SCAR REVISION Right 2005    @ amputation   STUMP REVISION Right 08/21/2018    Procedure: REVISION RIGHT BELOW KNEE AMPUTATION, EXCISION FIBULA;  Surgeon: [name], MD;  Location: [facility];  Service: Orthopedics;  Laterality: Right;   STUMP REVISION Right 09/09/2018    Procedure: REVISION RIGHT BELOW KNEE AMPUTATION;  Surgeon: [name], MD;  Location: [facility] OR;  Service: Orthopedics;  Laterality: Right;   TOE AMPUTATION Right 2005   TRANSCAROTID ARTERY REVASCULARIZATION  Left 09/04/2022    Procedure: Left Transcarotid Artery Revascularization;  Surgeon: Nada Libman, MD;  Location: [facility] OR;  Service: Vascular;  Laterality: Left;   ULTRASOUND GUIDANCE FOR VASCULAR ACCESS Right 09/04/2022    Procedure: ULTRASOUND GUIDANCE FOR VASCULAR ACCESS, RIGHT FEMORAL VEIN;  Surgeon: Nada Libman, MD;  Location: [facility];  Service: Vascular;  Laterality: Right;         reports that he has been smoking cigarettes. He started smoking about 57 years ago. He has a 57.4 pack-year smoking history. He has never used smokeless tobacco. He reports that he does not drink alcohol and does not use drugs. family history includes Cirrhosis in his father; Diabetes in his maternal grandmother and mother; Hypothyroidism in his mother; Thyroid disease in his daughter. Allergies      Allergies  Allergen Reactions   Varenicline Nausea Only              Outpatient Encounter Medications as of 01/20/2023  Medication Sig   acetaminophen (TYLENOL) 500 MG tablet  Take 1,000 mg by mouth every 6 (six) hours as needed for moderate pain.   aspirin 81 MG chewable tablet Chew 81 mg by mouth daily.   atorvastatin (LIPITOR) 80 MG tablet Take 1 tablet (80 mg total) by mouth daily at 6 PM.   calcium carbonate (OSCAL) 1500 (600 Ca) MG TABS tablet Take 600 mg of elemental calcium by mouth daily with breakfast.    cholecalciferol (VITAMIN D) 1000 UNITS tablet Take 1,000 Units by mouth daily.   clopidogrel (PLAVIX) 75 MG tablet Take 1 tablet (75  mg total) by mouth daily.   Emollient (EUCERIN ADVANCED REPAIR) CREA Apply 1 application  topically 2 (two) times daily.   etanercept (ENBREL) 50 MG/ML injection Inject 50 mg into the skin once a week.   fexofenadine (ALLEGRA) 180 MG tablet Take 180 mg by mouth daily.   fluticasone (FLONASE) 50 MCG/ACT nasal spray Place 2 sprays into both nostrils daily as needed for allergies or rhinitis.   furosemide (LASIX) 20 MG tablet Take 20 mg by mouth daily as needed for fluid or edema.   hydrochlorothiazide (MICROZIDE) 12.5 MG capsule Take 12.5 mg by mouth daily.   insulin glargine (SEMGLEE) 100 UNIT/ML injection Inject 75 Units into the skin daily.   leflunomide (ARAVA) 20 MG tablet Take 20 mg by mouth daily.   liraglutide (VICTOZA) 18 MG/3ML SOPN Inject 1.8 mg into the skin daily.   metFORMIN (GLUCOPHAGE-XR) 500 MG 24 hr tablet Take 500 mg by mouth 2 (two) times daily with a meal.   metoprolol succinate (TOPROL-XL) 25 MG 24 hr tablet Take 25 mg by mouth at bedtime.   morphine (MS CONTIN) 100 MG 12 hr tablet Take 100 mg by mouth every 12 (twelve) hours.   nicotine (NICODERM CQ - DOSED IN MG/24 HOURS) 21 mg/24hr patch Place 21 mg onto the skin daily.   ondansetron (ZOFRAN-ODT) 4 MG disintegrating tablet Take 1 tablet (4 mg total) by mouth every 8 (eight) hours as needed for nausea or vomiting.   pantoprazole (PROTONIX) 40 MG tablet Take 1 tablet (40 mg total) by mouth daily before breakfast.   predniSONE (DELTASONE) 1 MG tablet Take 2  mg by mouth daily with breakfast.   pregabalin (LYRICA) 150 MG capsule Take 150 mg by mouth 2 (two) times daily.   promethazine (PHENERGAN) 25 MG tablet Take 25 mg by mouth every 8 (eight) hours as needed for nausea or vomiting.   promethazine (PHENERGAN) 25 MG/ML injection Inject 0.5 mLs (12.5 mg total) into the vein once as needed for nausea or vomiting. If not responding to oral promethazine.   Semaglutide,0.25 or 0.5MG /DOS, (OZEMPIC, 0.25 OR 0.5 MG/DOSE,) 2 MG/1.5ML SOPN Inject 0.5 mg into the skin once a week.   senna-docusate (SENOKOT-S) 8.6-50 MG tablet Take 1 tablet by mouth 2 (two) times daily as needed for mild constipation.   sodium chloride (OCEAN) 0.65 % SOLN nasal spray Place 2 sprays into both nostrils 4 (four) times daily as needed for congestion.   Sodium Fluoride (PREVIDENT 5000 BOOSTER PLUS DT) Place 1 application  onto teeth every evening.   vitamin B-12 (CYANOCOBALAMIN) 100 MCG tablet Take 100 mcg by mouth daily.      No facility-administered encounter medications on file as of 01/20/2023.        REVIEW OF SYSTEMS  : All other systems reviewed and negative except where noted in the History of Present Illness.     PHYSICAL EXAM: BP 116/62   Pulse 91   Ht 6\' 1"  (1.854 m)   Wt 168 lb (76.2 kg)   BMI 22.16 kg/m  General: Well developed white male in no acute distress Head: Normocephalic and atraumatic Eyes:  Sclerae anicteric, conjunctiva pink. Ears: Normal auditory acuity Lungs: Clear throughout to auscultation; no W/R/R. Heart: Regular rate and rhythm; no M/R/G. Rectal:  Will be done at the time of colonoscopy. Musculoskeletal: Symmetrical with no gross deformities  Skin: No lesions on visible extremities Extremities: No edema  Neurological: Alert oriented x 4, grossly non-focal Psychological:  Alert and cooperative. Normal mood and affect  ASSESSMENT AND PLAN: 73 year old male history of hypertension, diabetes type 2, PVD s/p right AKA, carotid artery  stenosis, rheumatoid arthritis, CVA on DAPT (09/2022) presents for follow-up of chronic intermittent abdominal pain/nausea.   Nausea:  This is his biggest complaint.  Occurs randomly, no vomiting.  ? Diabetic gastroparesis.  But Ozempic may be worsening symptoms as well.   Abdominal pain, epigastric:  Associated with the nausea.   Abnormal CT scan:  Esophagitis seen on CT imaging.  No previous EGD.  Started on pantoprazole 40 mg daily in August.  Really no improvement in symptoms since starting that. - Will plan for EGD in January or February with Dr. Chales Abrahams at [facility]   Screening for colonoscopy:  Patient is due for repeat screening colonoscopy and had abnormal CT scan 08/2022 showing possible mild colitis.  Patient reports normal bowel movements and no rectal bleeding.  ? Infectious colitis at the time. --Will plan for colonoscopy with Dr. Chales Abrahams as well.   Antiplatelet use with Plavix due to history of PVD and CVA:  Hold Plavix for 5 days before procedure - will instruct when and how to resume after procedure. Risks and benefits of procedure including bleeding, perforation, infection, missed lesions, medication reactions and possible hospitalization or surgery if complications occur explained. Additional rare but real risk of cardiovascular event such as heart attack or ischemia/infarct of other organs off of Plavix explained and need to seek urgent help if this occurs. Will communicate by phone or EMR with patient's prescribing provider that to confirm holding Plavix is reasonable in this case.     DM: Had a normal GES in 2015.  May need to consider repeat pending results of EGD.  Although is on ozempic so will skew results.    CC:  Kermit Balo, DO   Attending physician's note   I have taken history, reviewed the chart and examined the patient. I performed a substantive portion of this encounter, including complete performance of at least one of the key components, in conjunction with  the APP. I agree with the Advanced Practitioner's note, impression and recommendations.   Additional history-came into the emergency room Potassium 2.8-being replaced  Denies having any nausea or vomiting.  Denies having any abdominal pain.  He has chronic back pain and had exacerbation.  Willing to get EGD/colonoscopy done today as scheduled OK to proceed with EGD/colonoscopy from GI standpoint Will discuss with anesthesia   Edman Circle, MD [facility] [phone]

## 2023-04-21 NOTE — ED Triage Notes (Signed)
Patient BIB EMS from home c/o abdominal pain. Patient report severe abdominal pain this morning. Patient report took bowel prep last night and suppose to have colonoscopy schedule this morning at 7:30 AM. Patient report N/V/D.  BP 124/70 HR 110 RR 20 O2sat 100% on RA CBG 179

## 2023-04-22 ENCOUNTER — Encounter (HOSPITAL_COMMUNITY): Payer: Self-pay | Admitting: Gastroenterology

## 2023-04-22 LAB — SURGICAL PATHOLOGY

## 2023-05-26 ENCOUNTER — Ambulatory Visit (INDEPENDENT_AMBULATORY_CARE_PROVIDER_SITE_OTHER)
Admission: RE | Admit: 2023-05-26 | Discharge: 2023-05-26 | Disposition: A | Discharge status: Home / Self Care | Payer: Medicare (Managed Care) | Source: Ambulatory Visit | Attending: Surgery | Admitting: Surgery

## 2023-05-26 ENCOUNTER — Encounter: Payer: Self-pay | Admitting: Surgery

## 2023-05-26 ENCOUNTER — Ambulatory Visit (HOSPITAL_COMMUNITY)
Admission: RE | Admit: 2023-05-26 | Discharge: 2023-05-26 | Disposition: A | Discharge status: Home / Self Care | Payer: Medicare (Managed Care) | Source: Ambulatory Visit | Attending: Surgery | Admitting: Surgery

## 2023-05-26 ENCOUNTER — Ambulatory Visit (INDEPENDENT_AMBULATORY_CARE_PROVIDER_SITE_OTHER): Payer: Medicare (Managed Care) | Admitting: Surgery

## 2023-05-26 VITALS — BP 145/83 | HR 84 | Temp 97.8°F

## 2023-05-26 DIAGNOSIS — I6523 Occlusion and stenosis of bilateral carotid arteries: Secondary | ICD-10-CM | POA: Insufficient documentation

## 2023-05-26 DIAGNOSIS — I70213 Atherosclerosis of native arteries of extremities with intermittent claudication, bilateral legs: Secondary | ICD-10-CM | POA: Diagnosis present

## 2023-05-26 LAB — VAS US ABI WITH/WO TBI: Left ABI: 0.89

## 2023-05-26 NOTE — Progress Notes (Signed)
 [facility]  Patient name: Brian Zavala MRN: [id number] DOB: Oct 14, 1950 Sex: male   REASON FOR VISIT:    Follow up  HISOTRY OF PRESENT ILLNESS:    Brian Zavala is a 73 y.o. male who presented to the hospital on 09/01/2022 with right eye vision loss.  His workup revealed a infarct at the left occipital lobe.  CT scan revealed high-grade bilateral carotid stenosis.  On 09/04/2022, he underwent left-sided TCAR.  Intraoperative images revealed 80% stenosis that was resolved after stenting.  He was discharged home the following day.  He has had no new neurologic symptoms and his vision is about back to baseline  He has no new complaints since I last saw him.  He has had some wounds on his leg, but they have healed.   The patient has an extensive history of peripheral vas and has undergone percutaneous intervention by Dr. Allyson Sabal in the past.  He has had a right above-knee amputation.  He denies any symptoms in his left leg such as claudication, or ulceration.  There was also concern of her vascular disease and abdominal pain.  His abdominal pain has resolved.   PAST MEDICAL HISTORY:   Past Medical History:  Diagnosis Date   Arthritis    "pretty much all over"   Cellulitis of left foot    Colitis    COPD (chronic obstructive pulmonary disease) (HCC)    Dyspnea    Hx of BKA, right (HCC)    Hyperlipidemia    Hypertension    Nausea and vomiting 08/20/2022   Neuromuscular disorder (HCC)    neuropathy legs   Neuropathy    legs   Osteomyelitis (HCC) 04/2018   4th toe left foot   Osteomyelitis of fourth toe of left foot (HCC) 04/19/2018   Peripheral vascular disease (HCC)    Rheumatoid arthritis (HCC)    Sleep apnea    does not use a cpap   Stroke (HCC)    Type II diabetes mellitus (HCC)    Wears glasses    Wears partial dentures    top and bottom partials     FAMILY HISTORY:   Family History  Problem  Relation Age of Onset   Hypothyroidism Mother    Diabetes Mother    Cirrhosis Father    Diabetes Maternal Grandmother    Thyroid disease Daughter     SOCIAL HISTORY:   Social History   Tobacco Use   Smoking status: Every Day    Current packs/day: 0.50    Average packs/day: 1 pack/day for 58.2 years (57.6 ttl pk-yrs)    Types: Cigarettes    Start date: 03/04/1965   Smokeless tobacco: Never  Substance Use Topics   Alcohol use: No     ALLERGIES:   Allergies  Allergen Reactions   Varenicline Nausea Only     CURRENT MEDICATIONS:   Current Outpatient Medications  Medication Sig Dispense Refill   acetaminophen (TYLENOL) 500 MG tablet Take 1,000 mg by mouth every 6 (six) hours as needed for moderate pain.     aspirin 81 MG chewable tablet Chew 81 mg by mouth daily.     atorvastatin (LIPITOR) 80 MG tablet Take 1 tablet (80 mg total) by mouth daily at 6 PM. 30 tablet 2   calcium carbonate (OSCAL) 1500 (600 Ca) MG TABS tablet Take 600 mg of elemental calcium by mouth daily with breakfast.      cholecalciferol (VITAMIN D) 1000 UNITS tablet Take  1,000 Units by mouth daily.     clopidogrel (PLAVIX) 75 MG tablet Take 1 tablet (75 mg total) by mouth daily. 30 tablet 0   Emollient (EUCERIN ADVANCED REPAIR) CREA Apply 1 application  topically 2 (two) times daily.     etanercept (ENBREL) 50 MG/ML injection Inject 50 mg into the skin once a week.     fexofenadine (ALLEGRA) 180 MG tablet Take 180 mg by mouth daily.     fluticasone (FLONASE) 50 MCG/ACT nasal spray Place 2 sprays into both nostrils daily as needed for allergies or rhinitis.     furosemide (LASIX) 20 MG tablet Take 20 mg by mouth daily as needed for fluid or edema.     hydrochlorothiazide (MICROZIDE) 12.5 MG capsule Take 12.5 mg by mouth daily.     insulin glargine (SEMGLEE) 100 UNIT/ML injection Inject 75 Units into the skin daily.     leflunomide (ARAVA) 20 MG tablet Take 20 mg by mouth daily.     liraglutide (VICTOZA) 18  MG/3ML SOPN Inject 1.8 mg into the skin daily.     metFORMIN (GLUCOPHAGE-XR) 500 MG 24 hr tablet Take 500 mg by mouth 2 (two) times daily with a meal.     metoprolol succinate (TOPROL-XL) 25 MG 24 hr tablet Take 25 mg by mouth at bedtime.     morphine (MS CONTIN) 100 MG 12 hr tablet Take 100 mg by mouth every 12 (twelve) hours.     nicotine (NICODERM CQ - DOSED IN MG/24 HOURS) 21 mg/24hr patch Place 21 mg onto the skin daily.     ondansetron (ZOFRAN-ODT) 4 MG disintegrating tablet Take 1 tablet (4 mg total) by mouth every 8 (eight) hours as needed for nausea or vomiting. 20 tablet 0   pantoprazole (PROTONIX) 40 MG tablet Take 1 tablet (40 mg total) by mouth daily before breakfast. 30 tablet 3   predniSONE (DELTASONE) 1 MG tablet Take 2 mg by mouth daily with breakfast.     pregabalin (LYRICA) 150 MG capsule Take 150 mg by mouth 2 (two) times daily.     promethazine (PHENERGAN) 25 MG tablet Take 25 mg by mouth every 8 (eight) hours as needed for nausea or vomiting.     promethazine (PHENERGAN) 25 MG/ML injection Inject 0.5 mLs (12.5 mg total) into the vein once as needed for nausea or vomiting. If not responding to oral promethazine. 1 mL 0   Semaglutide,0.25 or 0.5MG /DOS, (OZEMPIC, 0.25 OR 0.5 MG/DOSE,) 2 MG/1.5ML SOPN Inject 0.5 mg into the skin once a week.     senna-docusate (SENOKOT-S) 8.6-50 MG tablet Take 1 tablet by mouth 2 (two) times daily as needed for mild constipation.     sodium chloride (OCEAN) 0.65 % SOLN nasal spray Place 2 sprays into both nostrils 4 (four) times daily as needed for congestion.     Sodium Fluoride (PREVIDENT 5000 BOOSTER PLUS DT) Place 1 application  onto teeth every evening.     vitamin B-12 (CYANOCOBALAMIN) 100 MCG tablet Take 100 mcg by mouth daily.     No current facility-administered medications for this visit.    REVIEW OF SYSTEMS:   [X]  denotes positive finding, [ ]  denotes negative finding Cardiac  Comments:  Chest pain or chest pressure:     Shortness of breath upon exertion:    Short of breath when lying flat:    Irregular heart rhythm:        Vascular    Pain in calf, thigh, or hip brought on by ambulation:  Pain in feet at night that wakes you up from your sleep:     Blood clot in your veins:    Leg swelling:         Pulmonary    Oxygen at home:    Productive cough:     Wheezing:         Neurologic    Sudden weakness in arms or legs:     Sudden numbness in arms or legs:     Sudden onset of difficulty speaking or slurred speech:    Temporary loss of vision in one eye:     Problems with dizziness:         Gastrointestinal    Blood in stool:     Vomited blood:         Genitourinary    Burning when urinating:     Blood in urine:        Psychiatric    Major depression:         Hematologic    Bleeding problems:    Problems with blood clotting too easily:        Skin    Rashes or ulcers:        Constitutional    Fever or chills:      PHYSICAL EXAM:   Vitals:   05/26/23 1119  BP: (!) 145/83  Pulse: 84  Temp: 97.8 F (36.6 C)  SpO2: 93%    GENERAL: The patient is a well-nourished male, in no acute distress. The vital signs are documented above. CARDIAC: There is a regular rate and rhythm.  VASCULAR: Nonpalpable left pedal pulse PULMONARY: Non-labored respirations ABDOMEN: Soft and non-tender with normal pitched bowel sounds.  MUSCULOSKELETAL: There are no major deformities or cyanosis. NEUROLOGIC: No focal weakness or paresthesias are detected. SKIN: Healed left malleolar ulcer PSYCHIATRIC: The patient has a normal affect.  STUDIES:   I have reviewed the following: Carotid: Right Carotid: Velocities in the right ICA are consistent with a 1-39%  stenosis.                High end of range. The ECA appears >50% stenosed.   Left Carotid: The ECA appears >50% stenosed. Patent stent with no  stenosis.   Vertebrals: Bilateral vertebral arteries demonstrate antegrade flow.  Subclavians:  Right subclavian artery was stenotic. Normal flow  hemodynamics were               seen in the left subclavian artery.   ABI/TBIToday's ABIToday's TBIPrevious ABIPrevious TBI  +-------+-----------+-----------+------------+------------+  Right AKA                   AKA                       +-------+-----------+-----------+------------+------------+  Left  0.89       0.34       0.98        0.41          +-------+-----------+-----------+------------+------------+   Left: 50-74% stenosis in the popliteal artery. Unable to obtain higher  velocity as seen on previous exam.    MEDICAL ISSUES:   Carotid: Stent is widely patent.  Right-sided stenosis is below the surgical threshold and he is asymptomatic.  I will repeat this in 6 months  PAD: The patient has undergone percutaneous intervention on the right and now has a right above-knee amputation.  ABIs were 0.89 today however waveforms were monophasic.  I suspect he has multilevel  disease.  Because his wounds are healed and he is not having any symptoms, I will continue to monitor this closely.  He knows to contact me should he develop any changes in his leg.    Charlena Cross, MD, FACS [facility] [phone] Pager [phone]

## 2023-05-29 LAB — LAB REPORT - SCANNED
A1c: 7.8
Calcium: 8.6
EGFR: 95
TSH: 2.31 (ref 0.41–5.90)

## 2023-07-22 ENCOUNTER — Ambulatory Visit: Payer: Medicare (Managed Care) | Admitting: "Endocrinology

## 2023-07-24 ENCOUNTER — Encounter: Payer: Self-pay | Admitting: Cardiovascular Disease

## 2023-08-18 ENCOUNTER — Emergency Department (HOSPITAL_COMMUNITY): Payer: Medicare (Managed Care)

## 2023-08-18 ENCOUNTER — Encounter (HOSPITAL_COMMUNITY): Payer: Self-pay

## 2023-08-18 ENCOUNTER — Emergency Department (HOSPITAL_COMMUNITY)
Admission: EM | Admit: 2023-08-18 | Discharge: 2023-08-18 | Disposition: A | Discharge status: Home / Self Care | Payer: Medicare (Managed Care) | Attending: Emergency Medicine | Admitting: Emergency Medicine

## 2023-08-18 ENCOUNTER — Other Ambulatory Visit: Payer: Self-pay

## 2023-08-18 DIAGNOSIS — Z7982 Long term (current) use of aspirin: Secondary | ICD-10-CM | POA: Insufficient documentation

## 2023-08-18 DIAGNOSIS — K3184 Gastroparesis: Secondary | ICD-10-CM | POA: Insufficient documentation

## 2023-08-18 DIAGNOSIS — Z794 Long term (current) use of insulin: Secondary | ICD-10-CM | POA: Insufficient documentation

## 2023-08-18 DIAGNOSIS — R112 Nausea with vomiting, unspecified: Secondary | ICD-10-CM | POA: Diagnosis present

## 2023-08-18 DIAGNOSIS — Z7984 Long term (current) use of oral hypoglycemic drugs: Secondary | ICD-10-CM | POA: Diagnosis not present

## 2023-08-18 DIAGNOSIS — Z7901 Long term (current) use of anticoagulants: Secondary | ICD-10-CM | POA: Insufficient documentation

## 2023-08-18 DIAGNOSIS — E119 Type 2 diabetes mellitus without complications: Secondary | ICD-10-CM | POA: Diagnosis not present

## 2023-08-18 LAB — COMPREHENSIVE METABOLIC PANEL WITH GFR
ALT: 19 U/L (ref 0–44)
AST: 19 U/L (ref 15–41)
Albumin: 2.9 g/dL — ABNORMAL LOW (ref 3.5–5.0)
Alkaline Phosphatase: 80 U/L (ref 38–126)
Anion gap: 12 (ref 5–15)
BUN: 11 mg/dL (ref 8–23)
CO2: 26 mmol/L (ref 22–32)
Calcium: 8.8 mg/dL — ABNORMAL LOW (ref 8.9–10.3)
Chloride: 98 mmol/L (ref 98–111)
Creatinine, Ser: 0.7 mg/dL (ref 0.61–1.24)
GFR, Estimated: >60 mL/min (ref >60)
Glucose, Bld: 257 mg/dL — ABNORMAL HIGH (ref 70–99)
Potassium: 3.6 mmol/L (ref 3.5–5.1)
Sodium: 136 mmol/L (ref 135–145)
Total Bilirubin: 0.4 mg/dL (ref 0.0–1.2)
Total Protein: 7 g/dL (ref 6.5–8.1)

## 2023-08-18 LAB — CBC WITH DIFFERENTIAL/PLATELET
Abs Immature Granulocytes: 0.07 10*3/uL (ref 0.00–0.07)
Basophils Absolute: 0.1 10*3/uL (ref 0.0–0.1)
Basophils Relative: 1 %
Eosinophils Absolute: 0.2 10*3/uL (ref 0.0–0.5)
Eosinophils Relative: 2 %
HCT: 43.2 % (ref 39.0–52.0)
Hemoglobin: 13.2 g/dL (ref 13.0–17.0)
Immature Granulocytes: 1 %
Lymphocytes Relative: 14 %
Lymphs Abs: 1.1 10*3/uL (ref 0.7–4.0)
MCH: 27 pg (ref 26.0–34.0)
MCHC: 30.6 g/dL (ref 30.0–36.0)
MCV: 88.5 fL (ref 80.0–100.0)
Monocytes Absolute: 0.7 10*3/uL (ref 0.1–1.0)
Monocytes Relative: 10 %
Neutro Abs: 5.5 10*3/uL (ref 1.7–7.7)
Neutrophils Relative %: 72 %
Platelets: 301 10*3/uL (ref 150–400)
RBC: 4.88 MIL/uL (ref 4.22–5.81)
RDW: 15.4 % (ref 11.5–15.5)
WBC: 7.6 10*3/uL (ref 4.0–10.5)
nRBC: 0 % (ref 0.0–0.2)

## 2023-08-18 LAB — TROPONIN I (HIGH SENSITIVITY): Troponin I (High Sensitivity): 11 ng/L (ref <18)

## 2023-08-18 LAB — LIPASE, BLOOD: Lipase: 27 U/L (ref 11–51)

## 2023-08-18 MED ORDER — IOHEXOL 350 MG/ML SOLN
75.0000 mL | Freq: Once | INTRAVENOUS | Status: AC | PRN
  Administered 2023-08-18: 75 mL via INTRAVENOUS

## 2023-08-18 MED ORDER — SODIUM CHLORIDE 0.9 % IV BOLUS
500.0000 mL | Freq: Once | INTRAVENOUS | Status: AC
  Administered 2023-08-18: 500 mL via INTRAVENOUS

## 2023-08-18 MED ORDER — ONDANSETRON HCL 4 MG/2ML IJ SOLN
4.0000 mg | Freq: Once | INTRAMUSCULAR | Status: AC
  Filled 2023-08-18: qty 2

## 2023-08-18 MED ORDER — ONDANSETRON HCL 4 MG PO TABS: 4.0000 mg | ORAL_TABLET | Freq: Four times a day (QID) | ORAL | 0 refills | Status: DC

## 2023-08-18 NOTE — Discharge Instructions (Addendum)
 It is very common for diabetics to develop gastroparesis, please read the attached instructions.  This is likely secondary to the diabetes and it is a form of neuropathy where the stomach does not empty very fast.  Thankfully labs and CT scan are normal  Zofran  is a medication which can help with nausea.  You may take 4 mg by mouth every 6 hours as needed if you are an adult, if your child under the age of 6 take half of a tablet or 2 mg every 6 hours as needed.  This should dissolve on your tongue within a short timeframe.  Wait about 30 minutes after taking it to help with drinking clear liquids.  Thank you for allowing us  to treat you in the emergency department today.  After reviewing your examination and potential testing that was done it appears that you are safe to go home.  I would like for you to follow-up with your doctor within the next several days, have them obtain your records and follow-up with them to review all potential tests and results from your visit.  If you should develop severe or worsening symptoms return to the emergency department immediately

## 2023-08-18 NOTE — ED Provider Triage Note (Signed)
 Emergency Medicine Provider Triage Evaluation Note  Brian Zavala , a 73 y.o. male  was evaluated in triage.  Pt complains of ill feeling since this morning.  History of gallbladder removal  Review of Systems  Positive: Jaundice, nauseous, fatigue, diaphoretic Negative: Vomiting, diarrhea, fever, chills, chest pain, shortness of breath  Physical Exam  BP 119/77 (BP Location: Right Arm)   Pulse 86   Temp 97.6 F (36.4 C)   Resp (!) 30   SpO2 96%  Gen:   Awake, ill-appearing Resp:  Normal effort  MSK:   Moves extremities without difficulty  Other:  Jaundice  Medical Decision Making  Medically screening exam initiated at 5:58 PM.  Appropriate orders placed.  Jacquelin Matin was informed that the remainder of the evaluation will be completed by another provider, this initial triage assessment does not replace that evaluation, and the importance of remaining in the ED until their evaluation is complete.  Labs ordered   Carie Charity, PA-C 08/18/23 1759

## 2023-08-18 NOTE — ED Triage Notes (Signed)
 Patient c/o nausea starting today with no vomiting. Family reports that patient is diaphoretic and jaundice as well. Patient has hx of gallbladder removal and diabetic foot problems. Patient appears tired and fatigued in triage and denies fever, chills, and shob.

## 2023-08-18 NOTE — ED Provider Notes (Signed)
 [place] [facility] AT [facility] Provider Note   CSN: [id number] Arrival date & time: 08/18/23  1719     Patient presents with: Emesis and Nausea   Brian Zavala is a 73 y.o. male.    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  According to his wife who is an additional historian he has been sick for several days with nausea, there is been no vomiting just some dry heaving.  He has been having bowel movements, he does not recall passing gas, he is only had a cholecystectomy but no other abdominal surgery.  He is making urine without difficulty, no fevers or chills, no coughing, no shortness of breath.    Prior to Admission medications   Medication Sig Start Date End Date Taking? Authorizing Provider  ondansetron  (ZOFRAN ) 4 MG tablet Take 1 tablet (4 mg total) by mouth every 6 (six) hours. 08/18/23  Yes Early Glisson, MD  acetaminophen  (TYLENOL ) 500 MG tablet Take 1,000 mg by mouth every 6 (six) hours as needed for moderate pain.    [provider]  aspirin  81 MG chewable tablet Chew 81 mg by mouth daily.    [provider]  atorvastatin  (LIPITOR ) 80 MG tablet Take 1 tablet (80 mg total) by mouth daily at 6 PM. 05/06/17   Sanjuanita Cruz, NP  calcium  carbonate (OSCAL) 1500 (600 Ca) MG TABS tablet Take 600 mg of elemental calcium  by mouth daily with breakfast.     [provider]  cholecalciferol  (VITAMIN D ) 1000 UNITS tablet Take 1,000 Units by mouth daily.    [provider]  clopidogrel  (PLAVIX ) 75 MG tablet Take 1 tablet (75 mg total) by mouth daily. 09/02/22   Singh, Prashant K, MD  Emollient (EUCERIN ADVANCED REPAIR) CREA Apply 1  application  topically 2 (two) times daily.    [provider]  etanercept  (ENBREL ) 50 MG/ML injection Inject 50 mg into the skin once a week.    [provider]  fexofenadine (ALLEGRA) 180 MG tablet Take 180 mg by mouth daily.    [provider]  fluticasone  (FLONASE ) 50 MCG/ACT nasal spray Place 2 sprays into both nostrils daily as needed for allergies or rhinitis.    [provider]  furosemide  (LASIX ) 20 MG tablet Take 20 mg by mouth daily as needed for fluid or edema.    [provider]  hydrochlorothiazide  (MICROZIDE ) 12.5 MG capsule Take 12.5 mg by mouth daily.    [provider]  insulin  glargine (SEMGLEE ) 100 UNIT/ML injection Inject 75 Units into the skin daily.    [provider]  leflunomide  (ARAVA ) 20 MG tablet Take 20 mg by mouth daily.    [provider]  liraglutide  (VICTOZA ) 18 MG/3ML SOPN Inject 1.8 mg into the skin daily.    [provider]  metFORMIN  (GLUCOPHAGE -XR) 500 MG 24 hr tablet Take 500 mg by mouth 2 (two) times daily with a meal.    [provider]  metoprolol  succinate (TOPROL -XL) 25 MG 24 hr tablet Take 25 mg by mouth at bedtime.    [provider]  morphine  (MS CONTIN ) 100 MG 12 hr tablet Take 100 mg by mouth every 12 (  twelve) hours.    [provider]  nicotine  (NICODERM CQ  - DOSED IN MG/24 HOURS) 21 mg/24hr patch Place 21 mg onto the skin daily.    [provider]  ondansetron  (ZOFRAN -ODT) 4 MG disintegrating tablet Take 1 tablet (4 mg total) by mouth every 8 (eight) hours as needed for nausea or vomiting. 08/21/22   Wynetta Heckle, MD  pantoprazole  (PROTONIX ) 40 MG tablet Take 1 tablet (40 mg total) by mouth daily before breakfast. 10/28/22   McMichael, Bayley M, PA-C  predniSONE  (DELTASONE ) 1 MG tablet Take 2 mg by mouth daily with breakfast.    [provider]  pregabalin  (LYRICA ) 150 MG capsule Take 150 mg by mouth 2 (two) times daily.     [provider]  promethazine  (PHENERGAN ) 25 MG tablet Take 25 mg by mouth every 8 (eight) hours as needed for nausea or vomiting.    [provider]  promethazine  (PHENERGAN ) 25 MG/ML injection Inject 0.5 mLs (12.5 mg total) into the vein once as needed for nausea or vomiting. If not responding to oral promethazine . 08/21/22   Wynetta Heckle, MD  Semaglutide,0.25 or 0.5MG /DOS, (OZEMPIC, 0.25 OR 0.5 MG/DOSE,) 2 MG/1.5ML SOPN Inject 0.5 mg into the skin once a week.    [provider]  senna-docusate (SENOKOT-S) 8.6-50 MG tablet Take 1 tablet by mouth 2 (two) times daily as needed for mild constipation.    [provider]  sodium chloride  (OCEAN) 0.65 % SOLN nasal spray Place 2 sprays into both nostrils 4 (four) times daily as needed for congestion.    [provider]  Sodium Fluoride (PREVIDENT 5000 BOOSTER PLUS DT) Place 1 application  onto teeth every evening.    [provider]  vitamin B-12 (CYANOCOBALAMIN ) 100 MCG tablet Take 100 mcg by mouth daily.    [provider]    Allergies: Varenicline    Review of Systems  Gastrointestinal:  Positive for vomiting.  All other systems reviewed and are negative.   Updated Vital Signs BP 123/89   Pulse 88   Temp 97.6 F (36.4 C)   Resp 18   SpO2 97%   Physical Exam Vitals and nursing note reviewed.  Constitutional:      General: He is not in acute distress.    Appearance: He is well-developed.  HENT:     Head: Normocephalic and atraumatic.     Mouth/Throat:     Pharynx: No oropharyngeal exudate.   Eyes:     General: No scleral icterus.       Right eye: No discharge.        Left eye: No discharge.     Conjunctiva/sclera: Conjunctivae normal.     Pupils: Pupils are equal, round, and reactive to light.   Neck:     Thyroid: No thyromegaly.     Vascular: No JVD.   Cardiovascular:     Rate and Rhythm: Normal rate and regular rhythm.     Heart sounds: Normal heart sounds.  No murmur heard.    No friction rub. No gallop.  Pulmonary:     Effort: Pulmonary effort is normal. No respiratory distress.     Breath sounds: Normal breath sounds. No wheezing or rales.  Abdominal:     General: Bowel sounds are normal. There is no distension.     Palpations: Abdomen is soft. There is no mass.     Tenderness: There is no abdominal tenderness.     Comments: Abdomen minimally tender, nondistended, no tympanitic  sounds to percussion, quiet bowel sounds   Musculoskeletal:        General: No tenderness. Normal range of motion.     Cervical back: Normal range of motion and neck supple.     Comments: Right lower extremity amputation stump site clean, left foot with absent toe but the other remaining 4 toes appear okay, the great toe has lost its nail, no signs of redness or swelling  Lymphadenopathy:     Cervical: No cervical adenopathy.   Skin:    General: Skin is warm and dry.     Findings: No erythema or rash.   Neurological:     General: No focal deficit present.     Mental Status: He is alert.     Coordination: Coordination normal.   Psychiatric:        Behavior: Behavior normal.     (all labs ordered are listed, but only abnormal results are displayed) Labs Reviewed  COMPREHENSIVE METABOLIC PANEL WITH GFR - Abnormal; Notable for the following components:      Result Value   Glucose, Bld 257 (*)    Calcium  8.8 (*)    Albumin 2.9 (*)    All other components within normal limits  LIPASE, BLOOD  CBC WITH DIFFERENTIAL/PLATELET  URINALYSIS, ROUTINE W REFLEX MICROSCOPIC  TROPONIN I (HIGH SENSITIVITY)    EKG: None  Radiology: CT ABDOMEN PELVIS W CONTRAST Result Date: 08/18/2023 EXAM: CT ABDOMEN AND PELVIS WITH CONTRAST 08/18/2023 08:20:18 PM TECHNIQUE: CT of the abdomen and pelvis was performed with the administration of intravenous contrast. Multiplanar reformatted images are provided for review. Automated exposure control, iterative reconstruction, and/or  weight based adjustment of the mA/kV was utilized to reduce the radiation dose to as low as reasonably achievable. COMPARISON: 08/20/2022 CLINICAL HISTORY: Bowel obstruction suspected. FINDINGS: LOWER CHEST: Subpleural reticulation and fibrosis in the lungs bilaterally. LIVER: Liver is unremarkable. GALLBLADDER AND BILE DUCTS: Status post cholecystectomy. SPLEEN: No acute abnormality. PANCREAS: No acute abnormality. ADRENAL GLANDS: No acute abnormality. KIDNEYS, URETERS AND BLADDER: 2.7 cm simple right lower pole renal cyst (image 40), benign (Bosniak 1), no follow-up is recommended. No stones in the kidneys or ureters. No hydronephrosis. No perinephric or periureteral stranding. Urinary bladder is unremarkable. GI AND BOWEL: Normal appendix (image 56). Stomach demonstrates no acute abnormality. There is no bowel obstruction. No bowel wall thickening. PERITONEUM AND RETROPERITONEUM: No ascites. No free air. VASCULATURE: Atherosclerotic calcifications of the abdominal aorta and branch vessels, although patent. LYMPH NODES: Calcified perihepatic and portacaval nodes, benign/reactive. REPRODUCTIVE ORGANS: No acute abnormality. BONES AND SOFT TISSUES: Mild degenerative changes of the visualized thoracolumbar spine, most prominent at L4-5. Post-procedural changes along the left anterior abdominal wall. IMPRESSION: 1. No acute findings. 2. Additional ancillary findings as above. Electronically signed by: Zadie Herter MD 08/18/2023 08:27 PM EDT RP Workstation: [id number]     Procedures   Medications Ordered in the ED  sodium chloride  0.9 % bolus 500 mL (has no administration in time range)  ondansetron  (ZOFRAN ) injection 4 mg (4 mg Intravenous Given 08/18/23 1922)  iohexol  (OMNIPAQUE ) 350 MG/ML injection 75 mL (75 mLs Intravenous Contrast Given 08/18/23 2020)                                    Medical Decision Making Amount and/or Complexity of Data Reviewed Radiology: ordered.  Risk Prescription drug  management.    This patient presents to the ED for concern  of persistent nausea, seems to have flared up.  The patient had recently had a admission for EGD and colonoscopy in February, this involves an extensive number of treatment options, and is a complaint that carries with it a high risk of complications and morbidity.  The differential diagnosis includes gastroparesis, bowel obstruction, infection   Co morbidities / Chronic conditions that complicate the patient evaluation  Chronic diabetes   Additional history obtained:  Additional history obtained from EMR External records from outside source obtained and reviewed including EGD and colonoscopy April 21, 2023, reviewed EGD report which showed normal duodenum, gastritis, normal esophagus Colonoscopy from that same date showed mild sigmoid diverticulitis, nonbleeding internal hemorrhoids otherwise unremarkable.   Lab Tests:  I Ordered, and personally interpreted labs.  The pertinent results include: Laboratory workup shows no significant leukocytosis or metabolic abnormalities   Imaging Studies ordered:  I ordered imaging studies including CT scan of the abdomen and pelvis I independently visualized and interpreted imaging which showed no acute findings on CT scan I agree with the radiologist interpretation   Cardiac Monitoring: / EKG:  The patient was maintained on a cardiac monitor.  I personally viewed and interpreted the cardiac monitored which showed an underlying rhythm of: Normal sinus rhythm   Problem List / ED Course / Critical interventions / Medication management  This patient has what appears to be findings consistent with gastroparesis.  Unfortunately this is probably a chronic ongoing condition which is gena wax and wane but there is no signs of bowel obstruction surgical causes and I doubt that this is mesenteric ischemia I ordered medication including Zofran  and IV fluids Reevaluation of the patient  after these medicines showed that the patient proved I have reviewed the patients home medicines and have made adjustments as needed   Stable for outpatient follow-up  Social Determinants of Health:  Chronic diabetic   Test / Admission - Considered:  Considered admission but CT scan reassuring, labs reassuring, vital signs normal, patient stable for discharge        Final diagnoses:  Gastroparesis    ED Discharge Orders          Ordered    ondansetron  (ZOFRAN ) 4 MG tablet  Every 6 hours        08/18/23 2058               Early Glisson, MD 08/18/23 2118

## 2023-09-18 ENCOUNTER — Ambulatory Visit (INDEPENDENT_AMBULATORY_CARE_PROVIDER_SITE_OTHER): Payer: Medicare (Managed Care) | Admitting: "Endocrinology

## 2023-09-18 ENCOUNTER — Encounter: Payer: Self-pay | Admitting: "Endocrinology

## 2023-09-18 VITALS — BP 120/80 | HR 85 | Ht 73.0 in | Wt 170.0 lb

## 2023-09-18 DIAGNOSIS — E7800 Pure hypercholesterolemia, unspecified: Secondary | ICD-10-CM

## 2023-09-18 DIAGNOSIS — E1165 Type 2 diabetes mellitus with hyperglycemia: Secondary | ICD-10-CM

## 2023-09-18 DIAGNOSIS — Z794 Long term (current) use of insulin: Secondary | ICD-10-CM

## 2023-09-18 DIAGNOSIS — Z7984 Long term (current) use of oral hypoglycemic drugs: Secondary | ICD-10-CM | POA: Diagnosis not present

## 2023-09-18 MED ORDER — BAQSIMI ONE PACK 3 MG/DOSE NA POWD: 1.0000 | NASAL | 3 refills | Status: DC | PRN

## 2023-09-18 MED ORDER — BAQSIMI ONE PACK 3 MG/DOSE NA POWD
1.0000 | NASAL | 3 refills | Status: DC | PRN
Start: 2023-09-18 — End: 2023-09-18

## 2023-09-18 MED ORDER — EMPAGLIFLOZIN 10 MG PO TABS: 10.0000 mg | ORAL_TABLET | Freq: Every day | ORAL | 1 refills | Status: DC

## 2023-09-18 MED ORDER — EMPAGLIFLOZIN 10 MG PO TABS
10.0000 mg | ORAL_TABLET | Freq: Every day | ORAL | 1 refills | Status: DC
Start: 2023-09-18 — End: 2023-09-18

## 2023-09-18 NOTE — Progress Notes (Signed)
 Outpatient Endocrinology Note Brian Birmingham, MD  09/18/23   Brian Zavala 1950-07-04 [id number]  Referring Provider: Cleotilde Laymon HERO, NP Primary Care Provider: Cloria Annabella CROME, DO Reason for consultation: Subjective   Assessment & Plan  Diagnoses and all orders for this visit:  Uncontrolled type 2 diabetes mellitus with hyperglycemia (HCC) -     Microalbumin / creatinine urine ratio  Long term (current) use of oral hypoglycemic drugs  Long-term insulin  use (HCC)  Pure hypercholesterolemia  Other orders -     Discontinue: empagliflozin  (JARDIANCE ) 10 MG TABS tablet; Take 1 tablet (10 mg total) by mouth daily before breakfast. -     Discontinue: empagliflozin  (JARDIANCE ) 10 MG TABS tablet; Take 1 tablet (10 mg total) by mouth daily before breakfast. -     Discontinue: Glucagon  (BAQSIMI  ONE PACK) 3 MG/DOSE POWD; Place 1 Device into the nose as needed (Low blood sugar with impaired consciousness). -     Discontinue: Glucagon  (BAQSIMI  ONE PACK) 3 MG/DOSE POWD; Place 1 Device into the nose as needed (Low blood sugar with impaired consciousness). -     empagliflozin  (JARDIANCE ) 10 MG TABS tablet; Take 1 tablet (10 mg total) by mouth daily before breakfast. -     Glucagon  (BAQSIMI  ONE PACK) 3 MG/DOSE POWD; Place 1 Device into the nose as needed (Low blood sugar with impaired consciousness).  Diabetes Type II complicated by neuropathy, No results found for: GFR Hba1c goal less than 7, current Hba1c is 7.6 on 09/15/23 Lab Results  Component Value Date   HGBA1C 7.0 (H) 08/21/2022   Will recommend the following: Semglee  insulin  80 units in morning, stop 20 units at night   Novolog  on scale: 131-180 2 u, 181-240=4 u, 241-300=6 u, 301-350=8 u, 351-400= 10 u, >400 = 12 u Metformin  XR 500mg  2 pills bid   Couldn't tolerate victoza /ozempic due to GI S/E No known contraindications/side effects to any of above medications Glucagon  discussed and prescribed with refills on  09/18/23  -Last LD and Tg are as follows: Lab Results  Component Value Date   LDLCALC 34 09/05/2022    Lab Results  Component Value Date   TRIG 87 09/05/2022   -On atorvastatin  80 mg QD -Follow low fat diet and exercise   -Blood pressure goal <140/90 - Microalbumin/creatinine goal is < 30 -Last MA/Cr is as follows: No results found for: MICROALBUR, [id number] -not on ACE/ARB  -diet changes including salt restriction -limit eating outside -counseled BP targets per standards of diabetes care -uncontrolled blood pressure can lead to retinopathy, nephropathy and cardiovascular and atherosclerotic heart disease  Reviewed and counseled on: -A1C target -Blood sugar targets -Complications of uncontrolled diabetes  -Checking blood sugar before meals and bedtime and bring log next visit -All medications with mechanism of action and side effects -Hypoglycemia management: rule of 15's, Glucagon  Emergency Kit and medical alert ID -low-carb low-fat plate-method diet -At least 20 minutes of physical activity per day -Annual dilated retinal eye exam and foot exam -compliance and follow up needs -follow up as scheduled or earlier if problem gets worse  Call if blood sugar is less than 70 or consistently above 250    Take a 15 gm snack of carbohydrate at bedtime before you go to sleep if your blood sugar is less than 100.    If you are going to fast after midnight for a test or procedure, ask your physician for instructions on how to reduce/decrease your insulin  dose.    Call if blood  sugar is less than 70 or consistently above 250  -Treating a low sugar by rule of 15  (15 gms of sugar every 15 min until sugar is more than 70) If you feel your sugar is low, test your sugar to be sure If your sugar is low (less than 70), then take 15 grams of a fast acting Carbohydrate (3-4 glucose tablets or glucose gel or 4 ounces of juice or regular soda) Recheck your sugar 15 min after treating  low to make sure it is more than 70 If sugar is still less than 70, treat again with 15 grams of carbohydrate          Don't drive the hour of hypoglycemia  If unconscious/unable to eat or drink by mouth, use glucagon  injection or nasal spray baqsimi  and call 911. Can repeat again in 15 min if still unconscious.  Return in about 5 weeks (around 10/23/2023) for visit, labs today.   I have reviewed current medications, nurse's notes, allergies, vital signs, past medical and surgical history, family medical history, and social history for this encounter. Counseled patient on symptoms, examination findings, lab findings, imaging results, treatment decisions and monitoring and prognosis. The patient understood the recommendations and agrees with the treatment plan. All questions regarding treatment plan were fully answered.  Brian Birmingham, MD  09/18/23   History of Present Illness Brian Zavala is a 73 y.o. year old male who presents for evaluation of Type II diabetes mellitus.  Brian Zavala was first diagnosed >20 years ago.   Diabetes education +  Home diabetes regimen: Semglee  insulin  75 qam and 20 units qpm  Novolog  on scale: 131-180 2 u, 181-240=4 u, 241-300=6 u, 301-350=8 u, 351-400= 10 u, >400 = 12 u Metformin  XR 500mg  2 pills bid   Couldn't tolerate victoza /ozempic due to GI S/E  COMPLICATIONS -  MI/Stroke -  retinopathy +  neuropathy -  nephropathy  SYMPTOMS REVIEWED - Polyuria - Weight loss - Blurred vision  BLOOD SUGAR DATA CGM interpretation: At today's visit, we reviewed her CGM downloads. The full report is scanned in the media. Reviewing the CGM trends, BG are low overnight and high later in the day.   Physical Exam  BP 120/80   Pulse 85   Ht 6' 1 (1.854 m)   Wt 170 lb (77.1 kg)   SpO2 90%   BMI 22.43 kg/m    Constitutional: well developed, well nourished Head: normocephalic, atraumatic Eyes: sclera anicteric, no redness Neck: supple Lungs:  normal respiratory effort Neurology: alert and oriented Skin: dry, no appreciable rashes Musculoskeletal: no appreciable defects Psychiatric: normal mood and affect Diabetic Foot Exam - Simple   No data filed      Current Medications Patient's Medications  New Prescriptions   EMPAGLIFLOZIN  (JARDIANCE ) 10 MG TABS TABLET    Take 1 tablet (10 mg total) by mouth daily before breakfast.   GLUCAGON  (BAQSIMI  ONE PACK) 3 MG/DOSE POWD    Place 1 Device into the nose as needed (Low blood sugar with impaired consciousness).  Previous Medications   ACETAMINOPHEN  (TYLENOL ) 500 MG TABLET    Take 1,000 mg by mouth every 6 (six) hours as needed for moderate pain.   ASPIRIN  81 MG CHEWABLE TABLET    Chew 81 mg by mouth daily.   ATORVASTATIN  (LIPITOR ) 80 MG TABLET    Take 1 tablet (80 mg total) by mouth daily at 6 PM.   CALCIUM  CARBONATE (OSCAL) 1500 (600 CA) MG TABS TABLET  Take 600 mg of elemental calcium  by mouth daily with breakfast.    CHOLECALCIFEROL  (VITAMIN D ) 1000 UNITS TABLET    Take 1,000 Units by mouth daily.   CLOPIDOGREL  (PLAVIX ) 75 MG TABLET    Take 1 tablet (75 mg total) by mouth daily.   EMOLLIENT (EUCERIN ADVANCED REPAIR) CREA    Apply 1 application  topically 2 (two) times daily.   ETANERCEPT  (ENBREL ) 50 MG/ML INJECTION    Inject 50 mg into the skin once a week.   FEXOFENADINE (ALLEGRA) 180 MG TABLET    Take 180 mg by mouth daily.   FLUTICASONE  (FLONASE ) 50 MCG/ACT NASAL SPRAY    Place 2 sprays into both nostrils daily as needed for allergies or rhinitis.   FUROSEMIDE  (LASIX ) 20 MG TABLET    Take 20 mg by mouth daily as needed for fluid or edema.   HYDROCHLOROTHIAZIDE  (MICROZIDE ) 12.5 MG CAPSULE    Take 12.5 mg by mouth daily.   INSULIN  ASPART (NOVOLOG ) 100 UNIT/ML INJECTION    Inject 10 Units into the skin once.   INSULIN  GLARGINE (SEMGLEE ) 100 UNIT/ML INJECTION    Inject 75 Units into the skin daily.   LEFLUNOMIDE  (ARAVA ) 20 MG TABLET    Take 20 mg by mouth daily.   LIRAGLUTIDE   (VICTOZA ) 18 MG/3ML SOPN    Inject 1.8 mg into the skin daily.   METFORMIN  (GLUCOPHAGE -XR) 500 MG 24 HR TABLET    Take 500 mg by mouth 2 (two) times daily with a meal.   METOPROLOL  SUCCINATE (TOPROL -XL) 25 MG 24 HR TABLET    Take 25 mg by mouth at bedtime.   MORPHINE  (MS CONTIN ) 100 MG 12 HR TABLET    Take 100 mg by mouth every 12 (twelve) hours.   NICOTINE  (NICODERM CQ  - DOSED IN MG/24 HOURS) 21 MG/24HR PATCH    Place 21 mg onto the skin daily.   ONDANSETRON  (ZOFRAN ) 4 MG TABLET    Take 1 tablet (4 mg total) by mouth every 6 (six) hours.   ONDANSETRON  (ZOFRAN -ODT) 4 MG DISINTEGRATING TABLET    Take 1 tablet (4 mg total) by mouth every 8 (eight) hours as needed for nausea or vomiting.   PANTOPRAZOLE  (PROTONIX ) 40 MG TABLET    Take 1 tablet (40 mg total) by mouth daily before breakfast.   PREDNISONE  (DELTASONE ) 1 MG TABLET    Take 2 mg by mouth daily with breakfast.   PREGABALIN  (LYRICA ) 150 MG CAPSULE    Take 150 mg by mouth 2 (two) times daily.   PROMETHAZINE  (PHENERGAN ) 25 MG TABLET    Take 25 mg by mouth every 8 (eight) hours as needed for nausea or vomiting.   PROMETHAZINE  (PHENERGAN ) 25 MG/ML INJECTION    Inject 0.5 mLs (12.5 mg total) into the vein once as needed for nausea or vomiting. If not responding to oral promethazine .   SEMAGLUTIDE,0.25 OR 0.5MG /DOS, (OZEMPIC, 0.25 OR 0.5 MG/DOSE,) 2 MG/1.5ML SOPN    Inject 0.5 mg into the skin once a week.   SENNA-DOCUSATE (SENOKOT-S) 8.6-50 MG TABLET    Take 1 tablet by mouth 2 (two) times daily as needed for mild constipation.   SODIUM CHLORIDE  (OCEAN) 0.65 % SOLN NASAL SPRAY    Place 2 sprays into both nostrils 4 (four) times daily as needed for congestion.   SODIUM FLUORIDE (PREVIDENT 5000 BOOSTER PLUS DT)    Place 1 application  onto teeth every evening.   VITAMIN B-12 (CYANOCOBALAMIN ) 100 MCG TABLET    Take 100 mcg by  mouth daily.  Modified Medications   No medications on file  Discontinued Medications   No medications on file     Allergies Allergies  Allergen Reactions   Varenicline Nausea Only    Past Medical History Past Medical History:  Diagnosis Date   Arthritis    pretty much all over   Cellulitis of left foot    Colitis    COPD (chronic obstructive pulmonary disease) (HCC)    Dyspnea    Hx of BKA, right (HCC)    Hyperlipidemia    Hypertension    Nausea and vomiting 08/20/2022   Neuromuscular disorder (HCC)    neuropathy legs   Neuropathy    legs   Osteomyelitis (HCC) 04/2018   4th toe left foot   Osteomyelitis of fourth toe of left foot (HCC) 04/19/2018   Peripheral vascular disease (HCC)    Rheumatoid arthritis (HCC)    Sleep apnea    does not use a cpap   Stroke (HCC)    Type II diabetes mellitus (HCC)    Wears glasses    Wears partial dentures    top and bottom partials    Past Surgical History Past Surgical History:  Procedure Laterality Date   ABDOMINAL AORTOGRAM W/LOWER EXTREMITY Bilateral 10/19/2018   Procedure: ABDOMINAL AORTOGRAM W/LOWER EXTREMITY;  Surgeon: Court Dorn PARAS, MD;  Location: [facility];  Service: Cardiovascular;  Laterality: Bilateral;   ABOVE KNEE LEG AMPUTATION Right 11/27/2018   AMPUTATION Left 04/22/2018   Procedure: LEFT FOOT 4TH RAY AMPUTATION;  Surgeon: Harden Jerona GAILS, MD;  Location: [facility] OR;  Service: Orthopedics;  Laterality: Left;   AMPUTATION Right 11/27/2018   Procedure: RIGHT ABOVE KNEE AMPUTATION;  Surgeon: Harden Jerona GAILS, MD;  Location: [facility] OR;  Service: Orthopedics;  Laterality: Right;   BACK SURGERY     BIOPSY  04/21/2023   Procedure: BIOPSY;  Surgeon: Charlanne Groom, MD;  Location: [facility];  Service: Gastroenterology;;   CHOLECYSTECTOMY N/A 10/26/2013   Procedure: LAPAROSCOPIC CHOLECYSTECTOMY WITH INTRAOPERATIVE CHOLANGIOGRAM;  Surgeon: Debby LABOR. Cornett, MD;  Location: [facility];  Service: General;  Laterality: N/A;   COLONOSCOPY     COLONOSCOPY WITH PROPOFOL  N/A 04/21/2023   Procedure: COLONOSCOPY WITH PROPOFOL ;  Surgeon: Charlanne Groom, MD;  Location: [facility];  Service: Gastroenterology;  Laterality: N/A;   CYST EXCISION Bilateral 02/15/2015   Procedure: LEFT INDEX FINGER AND RIGHT MIDDLE FINGER NODULE EXCISION;  Surgeon: Kay CHRISTELLA Cummins, MD;  Location: [facility];  Service: Orthopedics;  Laterality: Bilateral;   CYST EXCISION PERINEAL N/A 12/08/2012   Procedure: CYST EXCISION PERINeum;  Surgeon: Debby A. Cornett, MD;  Location: [facility];  Service: General;  Laterality: N/A;   ESOPHAGOGASTRODUODENOSCOPY (EGD) WITH PROPOFOL  N/A 04/21/2023   Procedure: ESOPHAGOGASTRODUODENOSCOPY (EGD) WITH PROPOFOL ;  Surgeon: Charlanne Groom, MD;  Location: [facility];  Service: Gastroenterology;  Laterality: N/A;   FOOT AMPUTATION Right 2005   I & D EXTREMITY Right 03/07/2017   Procedure: EXCISION FIBULAR HEAD RIGHT BELOW KNEE AMPUTATION;  Surgeon: Harden Jerona GAILS, MD;  Location: [facility] OR;  Service: Orthopedics;  Laterality: Right;   INCISE AND DRAIN ABCESS  12/02/2014   PERINEAL ABSCESS   INCISION AND DRAINAGE PERIRECTAL ABSCESS Left 12/02/2014   Procedure: IRRIGATION AND DEBRIDEMENT PERINEAL ABSCESS;  Surgeon: Vicenta Poli, MD;  Location: [facility];  Service: General;  Laterality: Left;   LEG AMPUTATION BELOW KNEE Right 2005   LOWER EXTREMITY ANGIOGRAPHY N/A 05/05/2017   Procedure: LOWER EXTREMITY ANGIOGRAPHY;  Surgeon: Court Dorn PARAS, MD;  Location: [facility];  Service: Cardiovascular;  Laterality: N/A;   LUMBAR DISC SURGERY  82,90   ruptured disc   PERIPHERAL VASCULAR INTERVENTION Right 05/05/2017   Procedure: PERIPHERAL VASCULAR INTERVENTION;  Surgeon: Court Dorn PARAS, MD;  Location: [facility];  Service: Cardiovascular;  Laterality: Right;   PERIPHERAL VASCULAR INTERVENTION Right 10/19/2018   Procedure: PERIPHERAL VASCULAR INTERVENTION;  Surgeon: Court Dorn PARAS, MD;  Location: [facility];  Service: Cardiovascular;  Laterality: Right;   SCAR REVISION Right 2005   @ amputation    STUMP REVISION Right 08/21/2018   Procedure: REVISION RIGHT BELOW KNEE AMPUTATION, EXCISION FIBULA;  Surgeon: Harden Jerona GAILS, MD;  Location: [facility] OR;  Service: Orthopedics;  Laterality: Right;   STUMP REVISION Right 09/09/2018   Procedure: REVISION RIGHT BELOW KNEE AMPUTATION;  Surgeon: Harden Jerona GAILS, MD;  Location: [facility] OR;  Service: Orthopedics;  Laterality: Right;   TOE AMPUTATION Right 2005   TRANSCAROTID ARTERY REVASCULARIZATION  Left 09/04/2022   Procedure: Left Transcarotid Artery Revascularization;  Surgeon: Serene Gaile ORN, MD;  Location: [facility] OR;  Service: Vascular;  Laterality: Left;   ULTRASOUND GUIDANCE FOR VASCULAR ACCESS Right 09/04/2022   Procedure: ULTRASOUND GUIDANCE FOR VASCULAR ACCESS, RIGHT FEMORAL VEIN;  Surgeon: Serene Gaile ORN, MD;  Location: [facility] OR;  Service: Vascular;  Laterality: Right;    Family History family history includes Cirrhosis in his father; Diabetes in his maternal grandmother and mother; Hypothyroidism in his mother; Thyroid disease in his daughter.  Social History Social History   Socioeconomic History   Marital status: Divorced    Spouse name: Not on file   Number of children: 3   Years of education: Not on file   Highest education level: Not on file  Occupational History   Occupation: disabled  Tobacco Use   Smoking status: Every Day    Current packs/day: 0.50    Average packs/day: 1 pack/day for 58.5 years (57.8 ttl pk-yrs)    Types: Cigarettes    Start date: 03/04/1965   Smokeless tobacco: Never  Vaping Use   Vaping status: Never Used  Substance and Sexual Activity   Alcohol use: No   Drug use: No   Sexual activity: Not Currently  Other Topics Concern   Not on file  Social History Narrative   Not on file   Social Drivers of Health   Financial Resource Strain: Not on file  Food Insecurity: No Food Insecurity (08/20/2022)   Hunger Vital Sign    Worried About Running Out of Food in the Last Year: Never true    Ran Out of Food in the  Last Year: Never true  Transportation Needs: No Transportation Needs (08/20/2022)   PRAPARE - Administrator, Civil Service (Medical): No    Lack of Transportation (Non-Medical): No  Physical Activity: Not on file  Stress: Not on file  Social Connections: Not on file  Intimate Partner Violence: Not At Risk (08/20/2022)   Humiliation, Afraid, Rape, and Kick questionnaire    Fear of Current or Ex-Partner: No    Emotionally Abused: No    Physically Abused: No    Sexually Abused: No    Lab Results  Component Value Date   HGBA1C 7.0 (H) 08/21/2022   HGBA1C 9.0 (H) 11/27/2018   HGBA1C 7.6 (H) 08/21/2018   Lab Results  Component Value Date   CHOL 69 09/05/2022   Lab Results  Component Value Date  HDL 18 (L) 09/05/2022   Lab Results  Component Value Date   LDLCALC 34 09/05/2022   Lab Results  Component Value Date   TRIG 87 09/05/2022   Lab Results  Component Value Date   CHOLHDL 3.8 09/05/2022   Lab Results  Component Value Date   CREATININE 0.70 08/18/2023   No results found for: GFR No results found for: MACKEY CURRENT    Component Value Date/Time   NA 136 08/18/2023 1814   NA 138 05/02/2017 1408   K 3.6 08/18/2023 1814   CL 98 08/18/2023 1814   CO2 26 08/18/2023 1814   GLUCOSE 257 (H) 08/18/2023 1814   BUN 11 08/18/2023 1814   BUN 8 05/02/2017 1408   CREATININE 0.70 08/18/2023 1814   CREATININE 0.84 12/04/2017 1446   CALCIUM  8.8 (L) 08/18/2023 1814   CALCIUM  8.6 05/29/2023 0000   PROT 7.0 08/18/2023 1814   PROT 6.8 08/22/2022 1326   ALBUMIN 2.9 (L) 08/18/2023 1814   ALBUMIN 4.0 08/22/2022 1326   AST 19 08/18/2023 1814   ALT 19 08/18/2023 1814   ALKPHOS 80 08/18/2023 1814   BILITOT 0.4 08/18/2023 1814   BILITOT 0.4 08/22/2022 1326   GFRNONAA >60 08/18/2023 1814   GFRAA >60 11/27/2018 0931      Latest Ref Rng & Units 08/18/2023    6:14 PM 05/29/2023   12:00 AM 04/21/2023   11:04 AM  BMP  Glucose 70 - 99 mg/dL 742   804    BUN 8 - 23 mg/dL 11   8   Creatinine 9.38 - 1.24 mg/dL 9.29   9.51   Sodium 864 - 145 mmol/L 136   135   Potassium 3.5 - 5.1 mmol/L 3.6   3.0   Chloride 98 - 111 mmol/L 98   99   CO2 22 - 32 mmol/L 26   24   Calcium  8.9 - 10.3 mg/dL 8.8  8.6  7.9        Component Value Date/Time   WBC 7.6 08/18/2023 1814   RBC 4.88 08/18/2023 1814   HGB 13.2 08/18/2023 1814   HGB 14.9 05/02/2017 1408   HCT 43.2 08/18/2023 1814   HCT 43.3 05/02/2017 1408   PLT 301 08/18/2023 1814   PLT 235 05/02/2017 1408   MCV 88.5 08/18/2023 1814   MCV 85 05/02/2017 1408   MCH 27.0 08/18/2023 1814   MCHC 30.6 08/18/2023 1814   RDW 15.4 08/18/2023 1814   RDW 14.6 05/02/2017 1408   LYMPHSABS 1.1 08/18/2023 1814   LYMPHSABS 1.4 05/02/2017 1408   MONOABS 0.7 08/18/2023 1814   EOSABS 0.2 08/18/2023 1814   EOSABS 0.2 05/02/2017 1408   BASOSABS 0.1 08/18/2023 1814   BASOSABS 0.0 05/02/2017 1408     Parts of this note may have been dictated using voice recognition software. There may be variances in spelling and vocabulary which are unintentional. Not all errors are proofread. Please notify the dino if any discrepancies are noted or if the meaning of any statement is not clear.

## 2023-09-18 NOTE — Patient Instructions (Signed)

## 2023-09-19 ENCOUNTER — Encounter: Payer: Self-pay | Admitting: "Endocrinology

## 2023-09-19 LAB — MICROALBUMIN / CREATININE URINE RATIO
Creatinine, Urine: 50 mg/dL (ref 20–320)
Microalb Creat Ratio: 24 mg/g{creat} (ref <30)
Microalb, Ur: 1.2 mg/dL

## 2023-10-15 ENCOUNTER — Other Ambulatory Visit: Payer: Self-pay

## 2023-10-15 DIAGNOSIS — I6523 Occlusion and stenosis of bilateral carotid arteries: Secondary | ICD-10-CM

## 2023-10-15 DIAGNOSIS — I70229 Atherosclerosis of native arteries of extremities with rest pain, unspecified extremity: Secondary | ICD-10-CM

## 2023-10-23 ENCOUNTER — Ambulatory Visit (INDEPENDENT_AMBULATORY_CARE_PROVIDER_SITE_OTHER): Payer: Medicare (Managed Care) | Admitting: "Endocrinology

## 2023-10-23 ENCOUNTER — Encounter: Payer: Self-pay | Admitting: "Endocrinology

## 2023-10-23 VITALS — BP 104/80 | HR 104 | Ht 73.0 in | Wt 167.0 lb

## 2023-10-23 DIAGNOSIS — E7800 Pure hypercholesterolemia, unspecified: Secondary | ICD-10-CM

## 2023-10-23 DIAGNOSIS — Z794 Long term (current) use of insulin: Secondary | ICD-10-CM

## 2023-10-23 DIAGNOSIS — Z7984 Long term (current) use of oral hypoglycemic drugs: Secondary | ICD-10-CM | POA: Diagnosis not present

## 2023-10-23 DIAGNOSIS — E1165 Type 2 diabetes mellitus with hyperglycemia: Secondary | ICD-10-CM

## 2023-10-23 LAB — POCT GLYCOSYLATED HEMOGLOBIN (HGB A1C): Hemoglobin A1C: 7.5 % — AB (ref 4.0–5.6)

## 2023-10-23 MED ORDER — INSULIN GLARGINE 100 UNIT/ML ~~LOC~~ SOLN: 70.0000 [IU] | Freq: Every day | SUBCUTANEOUS | 1 refills | Status: AC

## 2023-10-23 MED ORDER — FREESTYLE LIBRE 3 PLUS SENSOR MISC: 3 refills | Status: DC

## 2023-10-23 MED ORDER — FREESTYLE LIBRE 3 READER DEVI: 1.0000 | 0 refills | Status: DC

## 2023-10-23 MED ORDER — EMPAGLIFLOZIN 25 MG PO TABS: 25.0000 mg | ORAL_TABLET | Freq: Every day | ORAL | 1 refills | Status: DC

## 2023-10-23 NOTE — Progress Notes (Signed)
 Outpatient Endocrinology Note Brian Birmingham, MD  10/23/23   Brian Zavala 07/30/1950 [id number]  Referring Provider: Cloria Annabella CROME, DO Primary Care Provider: Cloria Annabella CROME, DO Reason for consultation: Subjective   Assessment & Plan  Diagnoses and all orders for this visit:  Uncontrolled type 2 diabetes mellitus with hyperglycemia (HCC) -     POCT glycosylated hemoglobin (Hb A1C) -     Continuous Glucose Sensor (FREESTYLE LIBRE 3 PLUS SENSOR) MISC; Change sensor every 15 days. -     Continuous Glucose Receiver (FREESTYLE LIBRE 3 READER) DEVI; 1 Device by Does not apply route continuous. -     Ambulatory referral to Podiatry  Long term (current) use of oral hypoglycemic drugs  Long-term insulin  use (HCC)  Pure hypercholesterolemia  Other orders -     empagliflozin  (JARDIANCE ) 25 MG TABS tablet; Take 1 tablet (25 mg total) by mouth daily before breakfast. -     insulin  glargine (SEMGLEE ) 100 UNIT/ML injection; Inject 0.7 mLs (70 Units total) into the skin daily.  Diabetes Type II complicated by neuropathy, No results found for: GFR Hba1c goal less than 7, current Hba1c is 7.6 on 09/15/23 Lab Results  Component Value Date   HGBA1C 7.5 (A) 10/23/2023   Will recommend the following: Semglee  insulin  70 units in morning Jardiance  25 mg every day  Metformin  XR 500mg  2 pills twice a day Novolog  on scale: 241-300=6 u, 301-350=8 u, 351-400= 10 u, >400 = 12 u  10/23/23: referred to podiatry: L big toe came off, L second toe discolored. History of PAD, diabetes   Couldn't tolerate victoza /ozempic due to GI S/E No known contraindications/side effects to any of above medications Glucagon  discussed and prescribed with refills on 10/23/23  -Last LD and Tg are as follows: Lab Results  Component Value Date   LDLCALC 34 09/05/2022    Lab Results  Component Value Date   TRIG 87 09/05/2022   -On atorvastatin  80 mg QD -Follow low fat diet and exercise   -Blood  pressure goal <140/90 - Microalbumin/creatinine goal is < 30 -Last MA/Cr is as follows: Lab Results  Component Value Date   MICROALBUR 1.2 09/18/2023   -not on ACE/ARB  -diet changes including salt restriction -limit eating outside -counseled BP targets per standards of diabetes care -uncontrolled blood pressure can lead to retinopathy, nephropathy and cardiovascular and atherosclerotic heart disease  Reviewed and counseled on: -A1C target -Blood sugar targets -Complications of uncontrolled diabetes  -Checking blood sugar before meals and bedtime and bring log next visit -All medications with mechanism of action and side effects -Hypoglycemia management: rule of 15's, Glucagon  Emergency Kit and medical alert ID -low-carb low-fat plate-method diet -At least 20 minutes of physical activity per day -Annual dilated retinal eye exam and foot exam -compliance and follow up needs -follow up as scheduled or earlier if problem gets worse  Call if blood sugar is less than 70 or consistently above 250    Take a 15 gm snack of carbohydrate at bedtime before you go to sleep if your blood sugar is less than 100.    If you are going to fast after midnight for a test or procedure, ask your physician for instructions on how to reduce/decrease your insulin  dose.    Call if blood sugar is less than 70 or consistently above 250  -Treating a low sugar by rule of 15  (15 gms of sugar every 15 min until sugar is more than 70) If you feel  your sugar is low, test your sugar to be sure If your sugar is low (less than 70), then take 15 grams of a fast acting Carbohydrate (3-4 glucose tablets or glucose gel or 4 ounces of juice or regular soda) Recheck your sugar 15 min after treating low to make sure it is more than 70 If sugar is still less than 70, treat again with 15 grams of carbohydrate          Don't drive the hour of hypoglycemia  If unconscious/unable to eat or drink by mouth, use glucagon   injection or nasal spray baqsimi  and call 911. Can repeat again in 15 min if still unconscious.  Return in about 2 months (around 12/23/2023).   I have reviewed current medications, nurse's notes, allergies, vital signs, past medical and surgical history, family medical history, and social history for this encounter. Counseled patient on symptoms, examination findings, lab findings, imaging results, treatment decisions and monitoring and prognosis. The patient understood the recommendations and agrees with the treatment plan. All questions regarding treatment plan were fully answered.  Brian Birmingham, MD  10/23/23   History of Present Illness Brian Zavala is a 73 y.o. year old male who presents for evaluation of Type II diabetes mellitus.  Brian Zavala was first diagnosed >20 years ago.   Diabetes education +  Home diabetes regimen: Semglee  insulin  75 qam  Jardiance  10 mg every day  Novolog  on scale: 131-180 2 u, 181-240=4 u, 241-300=6 u, 301-350=8 u, 351-400= 10 u, >400 = 12 u Metformin  XR 500mg  2 pills bid   Couldn't tolerate victoza /ozempic due to GI S/E  COMPLICATIONS -  MI/Stroke, +PAD (seen vascular surgeon and had ABI/stent) -  retinopathy +  neuropathy -  nephropathy  SYMPTOMS REVIEWED - Polyuria - Weight loss - Blurred vision  BLOOD SUGAR DATA CGM interpretation: At today's visit, we reviewed her CGM downloads. The full report is scanned in the media. Reviewing the CGM trends, BG are elevated in daytime and improve in night/ overnight.  Physical Exam  BP 104/80   Pulse (!) 104   Ht 6' 1 (1.854 m)   Wt 167 lb (75.8 kg)   SpO2 95%   BMI 22.03 kg/m    Constitutional: well developed, well nourished Head: normocephalic, atraumatic Eyes: sclera anicteric, no redness Neck: supple Lungs: normal respiratory effort Neurology: alert and oriented Skin: dry, no appreciable rashes Musculoskeletal: no appreciable defects Psychiatric: normal mood and  affect Diabetic Foot Exam - Simple   Simple Foot Form Diabetic Foot exam was performed with the following findings: Yes 10/23/2023  8:39 AM  Visual Inspection No deformities, no ulcerations, no other skin breakdown bilaterally: Yes Sensation Testing Intact to touch and monofilament testing bilaterally: Yes Pulse Check Posterior Tibialis and Dorsalis pulse intact bilaterally: Yes Comments R foot amputated, L big toe came off, L second toe discolored. History of PAD, diabetes       Current Medications Patient's Medications  New Prescriptions   CONTINUOUS GLUCOSE RECEIVER (FREESTYLE LIBRE 3 READER) DEVI    1 Device by Does not apply route continuous.   CONTINUOUS GLUCOSE SENSOR (FREESTYLE LIBRE 3 PLUS SENSOR) MISC    Change sensor every 15 days.   EMPAGLIFLOZIN  (JARDIANCE ) 25 MG TABS TABLET    Take 1 tablet (25 mg total) by mouth daily before breakfast.  Previous Medications   ACETAMINOPHEN  (TYLENOL ) 500 MG TABLET    Take 1,000 mg by mouth every 6 (six) hours as needed for moderate pain.  ASPIRIN  81 MG CHEWABLE TABLET    Chew 81 mg by mouth daily.   ATORVASTATIN  (LIPITOR ) 80 MG TABLET    Take 1 tablet (80 mg total) by mouth daily at 6 PM.   CALCIUM  CARBONATE (OSCAL) 1500 (600 CA) MG TABS TABLET    Take 600 mg of elemental calcium  by mouth daily with breakfast.    CHOLECALCIFEROL  (VITAMIN D ) 1000 UNITS TABLET    Take 1,000 Units by mouth daily.   CLOPIDOGREL  (PLAVIX ) 75 MG TABLET    Take 1 tablet (75 mg total) by mouth daily.   EMOLLIENT (EUCERIN ADVANCED REPAIR) CREA    Apply 1 application  topically 2 (two) times daily.   ETANERCEPT  (ENBREL ) 50 MG/ML INJECTION    Inject 50 mg into the skin once a week.   FEXOFENADINE (ALLEGRA) 180 MG TABLET    Take 180 mg by mouth daily.   FLUTICASONE  (FLONASE ) 50 MCG/ACT NASAL SPRAY    Place 2 sprays into both nostrils daily as needed for allergies or rhinitis.   FUROSEMIDE  (LASIX ) 20 MG TABLET    Take 20 mg by mouth daily as needed for fluid or edema.    GLUCAGON  (BAQSIMI  ONE PACK) 3 MG/DOSE POWD    Place 1 Device into the nose as needed (Low blood sugar with impaired consciousness).   HYDROCHLOROTHIAZIDE  (MICROZIDE ) 12.5 MG CAPSULE    Take 12.5 mg by mouth daily.   INSULIN  ASPART (NOVOLOG ) 100 UNIT/ML INJECTION    Inject 10 Units into the skin once.   LEFLUNOMIDE  (ARAVA ) 20 MG TABLET    Take 20 mg by mouth daily.   LIRAGLUTIDE  (VICTOZA ) 18 MG/3ML SOPN    Inject 1.8 mg into the skin daily.   METFORMIN  (GLUCOPHAGE -XR) 500 MG 24 HR TABLET    Take 500 mg by mouth 2 (two) times daily with a meal.   METOPROLOL  SUCCINATE (TOPROL -XL) 25 MG 24 HR TABLET    Take 25 mg by mouth at bedtime.   MORPHINE  (MS CONTIN ) 100 MG 12 HR TABLET    Take 100 mg by mouth every 12 (twelve) hours.   NICOTINE  (NICODERM CQ  - DOSED IN MG/24 HOURS) 21 MG/24HR PATCH    Place 21 mg onto the skin daily.   ONDANSETRON  (ZOFRAN ) 4 MG TABLET    Take 1 tablet (4 mg total) by mouth every 6 (six) hours.   ONDANSETRON  (ZOFRAN -ODT) 4 MG DISINTEGRATING TABLET    Take 1 tablet (4 mg total) by mouth every 8 (eight) hours as needed for nausea or vomiting.   PANTOPRAZOLE  (PROTONIX ) 40 MG TABLET    Take 1 tablet (40 mg total) by mouth daily before breakfast.   PREDNISONE  (DELTASONE ) 1 MG TABLET    Take 2 mg by mouth daily with breakfast.   PREGABALIN  (LYRICA ) 150 MG CAPSULE    Take 150 mg by mouth 2 (two) times daily.   PROMETHAZINE  (PHENERGAN ) 25 MG TABLET    Take 25 mg by mouth every 8 (eight) hours as needed for nausea or vomiting.   PROMETHAZINE  (PHENERGAN ) 25 MG/ML INJECTION    Inject 0.5 mLs (12.5 mg total) into the vein once as needed for nausea or vomiting. If not responding to oral promethazine .   SEMAGLUTIDE,0.25 OR 0.5MG /DOS, (OZEMPIC, 0.25 OR 0.5 MG/DOSE,) 2 MG/1.5ML SOPN    Inject 0.5 mg into the skin once a week.   SENNA-DOCUSATE (SENOKOT-S) 8.6-50 MG TABLET    Take 1 tablet by mouth 2 (two) times daily as needed for mild constipation.   SODIUM CHLORIDE  (  OCEAN) 0.65 % SOLN NASAL  SPRAY    Place 2 sprays into both nostrils 4 (four) times daily as needed for congestion.   SODIUM FLUORIDE (PREVIDENT 5000 BOOSTER PLUS DT)    Place 1 application  onto teeth every evening.   VITAMIN B-12 (CYANOCOBALAMIN ) 100 MCG TABLET    Take 100 mcg by mouth daily.  Modified Medications   Modified Medication Previous Medication   INSULIN  GLARGINE (SEMGLEE ) 100 UNIT/ML INJECTION insulin  glargine (SEMGLEE ) 100 UNIT/ML injection      Inject 0.7 mLs (70 Units total) into the skin daily.    Inject 75 Units into the skin daily.  Discontinued Medications   EMPAGLIFLOZIN  (JARDIANCE ) 10 MG TABS TABLET    Take 1 tablet (10 mg total) by mouth daily before breakfast.    Allergies Allergies  Allergen Reactions   Varenicline Nausea Only    Past Medical History Past Medical History:  Diagnosis Date   Arthritis    pretty much all over   Cellulitis of left foot    Colitis    COPD (chronic obstructive pulmonary disease) (HCC)    Dyspnea    Hx of BKA, right (HCC)    Hyperlipidemia    Hypertension    Nausea and vomiting 08/20/2022   Neuromuscular disorder (HCC)    neuropathy legs   Neuropathy    legs   Osteomyelitis (HCC) 04/2018   4th toe left foot   Osteomyelitis of fourth toe of left foot (HCC) 04/19/2018   Peripheral vascular disease (HCC)    Rheumatoid arthritis (HCC)    Sleep apnea    does not use a cpap   Stroke (HCC)    Type II diabetes mellitus (HCC)    Wears glasses    Wears partial dentures    top and bottom partials    Past Surgical History Past Surgical History:  Procedure Laterality Date   ABDOMINAL AORTOGRAM W/LOWER EXTREMITY Bilateral 10/19/2018   Procedure: ABDOMINAL AORTOGRAM W/LOWER EXTREMITY;  Surgeon: Court Dorn PARAS, MD;  Location: [facility];  Service: Cardiovascular;  Laterality: Bilateral;   ABOVE KNEE LEG AMPUTATION Right 11/27/2018   AMPUTATION Left 04/22/2018   Procedure: LEFT FOOT 4TH RAY AMPUTATION;  Surgeon: Harden Jerona GAILS, MD;   Location[facility];  Service: Orthopedics;  Laterality: Left;   AMPUTATION Right 11/27/2018   Procedure: RIGHT ABOVE KNEE AMPUTATION;  Surgeon: Harden Jerona GAILS, MD;  Location: [facility] OR;  Service: Orthopedics;  Laterality: Right;   BACK SURGERY     BIOPSY  04/21/2023   Procedure: BIOPSY;  Surgeon: Charlanne Groom, MD;  Location: [facility];  Service: Gastroenterology;;   CHOLECYSTECTOMY N/A 10/26/2013   Procedure: LAPAROSCOPIC CHOLECYSTECTOMY WITH INTRAOPERATIVE CHOLANGIOGRAM;  Surgeon: Debby LABOR. Cornett, MD;  Location: [facility];  Service: General;  Laterality: N/A;   COLONOSCOPY     COLONOSCOPY WITH PROPOFOL  N/A 04/21/2023   Procedure: COLONOSCOPY WITH PROPOFOL ;  Surgeon: Charlanne Groom, MD;  Location: [facility];  Service: Gastroenterology;  Laterality: N/A;   CYST EXCISION Bilateral 02/15/2015   Procedure: LEFT INDEX FINGER AND RIGHT MIDDLE FINGER NODULE EXCISION;  Surgeon: Kay CHRISTELLA Cummins, MD;  Location: [facility];  Service: Orthopedics;  Laterality: Bilateral;   CYST EXCISION PERINEAL N/A 12/08/2012   Procedure: CYST EXCISION PERINeum;  Surgeon: Debby A. Cornett, MD;  Location: [facility];  Service: General;  Laterality: N/A;   ESOPHAGOGASTRODUODENOSCOPY (EGD) WITH PROPOFOL  N/A 04/21/2023   Procedure: ESOPHAGOGASTRODUODENOSCOPY (EGD) WITH PROPOFOL ;  Surgeon: Charlanne Groom, MD;  Location: [facility];  Service: Gastroenterology;  Laterality: N/A;   FOOT AMPUTATION Right 2005   I & D EXTREMITY Right 03/07/2017   Procedure: EXCISION FIBULAR HEAD RIGHT BELOW KNEE AMPUTATION;  Surgeon: Harden Jerona GAILS, MD;  Location: [facility] OR;  Service: Orthopedics;  Laterality: Right;   INCISE AND DRAIN ABCESS  12/02/2014   PERINEAL ABSCESS   INCISION AND DRAINAGE PERIRECTAL ABSCESS Left 12/02/2014   Procedure: IRRIGATION AND DEBRIDEMENT PERINEAL ABSCESS;  Surgeon: Vicenta Poli, MD;  Location: [facility];  Service: General;  Laterality: Left;   LEG AMPUTATION BELOW KNEE Right 2005   LOWER EXTREMITY  ANGIOGRAPHY N/A 05/05/2017   Procedure: LOWER EXTREMITY ANGIOGRAPHY;  Surgeon: Court Dorn PARAS, MD;  Location: [facility];  Service: Cardiovascular;  Laterality: N/A;   LUMBAR DISC SURGERY  82,90   ruptured disc   PERIPHERAL VASCULAR INTERVENTION Right 05/05/2017   Procedure: PERIPHERAL VASCULAR INTERVENTION;  Surgeon: Court Dorn PARAS, MD;  Location: [facility];  Service: Cardiovascular;  Laterality: Right;   PERIPHERAL VASCULAR INTERVENTION Right 10/19/2018   Procedure: PERIPHERAL VASCULAR INTERVENTION;  Surgeon: Court Dorn PARAS, MD;  Location: [facility];  Service: Cardiovascular;  Laterality: Right;   SCAR REVISION Right 2005   @ amputation   STUMP REVISION Right 08/21/2018   Procedure: REVISION RIGHT BELOW KNEE AMPUTATION, EXCISION FIBULA;  Surgeon: Harden Jerona GAILS, MD;  Location: [facility];  Service: Orthopedics;  Laterality: Right;   STUMP REVISION Right 09/09/2018   Procedure: REVISION RIGHT BELOW KNEE AMPUTATION;  Surgeon: Harden Jerona GAILS, MD;  Location: [facility] OR;  Service: Orthopedics;  Laterality: Right;   TOE AMPUTATION Right 2005   TRANSCAROTID ARTERY REVASCULARIZATION  Left 09/04/2022   Procedure: Left Transcarotid Artery Revascularization;  Surgeon: Serene Gaile ORN, MD;  Location: [facility] OR;  Service: Vascular;  Laterality: Left;   ULTRASOUND GUIDANCE FOR VASCULAR ACCESS Right 09/04/2022   Procedure: ULTRASOUND GUIDANCE FOR VASCULAR ACCESS, RIGHT FEMORAL VEIN;  Surgeon: Serene Gaile ORN, MD;  Location: [facility];  Service: Vascular;  Laterality: Right;    Family History family history includes Cirrhosis in his father; Diabetes in his maternal grandmother and mother; Hypothyroidism in his mother; Thyroid disease in his daughter.  Social History Social History   Socioeconomic History   Marital status: Divorced    Spouse name: Not on file   Number of children: 3   Years of education: Not on file   Highest education level: Not on file  Occupational History   Occupation:  disabled  Tobacco Use   Smoking status: Every Day    Current packs/day: 0.50    Average packs/day: 1 pack/day for 58.6 years (57.8 ttl pk-yrs)    Types: Cigarettes    Start date: 03/04/1965   Smokeless tobacco: Never  Vaping Use   Vaping status: Never Used  Substance and Sexual Activity   Alcohol use: No   Drug use: No   Sexual activity: Not Currently  Other Topics Concern   Not on file  Social History Narrative   Not on file   Social Drivers of Health   Financial Resource Strain: Not on file  Food Insecurity: No Food Insecurity (08/20/2022)   Hunger Vital Sign    Worried About Running Out of Food in the Last Year: Never true    Ran Out of Food in the Last Year: Never true  Transportation Needs: No Transportation Needs (08/20/2022)   PRAPARE - Transportation    Lack of Transportation (Medical): No    Lack of  Transportation (Non-Medical): No  Physical Activity: Not on file  Stress: Not on file  Social Connections: Not on file  Intimate Partner Violence: Not At Risk (08/20/2022)   Humiliation, Afraid, Rape, and Kick questionnaire    Fear of Current or Ex-Partner: No    Emotionally Abused: No    Physically Abused: No    Sexually Abused: No    Lab Results  Component Value Date   HGBA1C 7.5 (A) 10/23/2023   HGBA1C 7.0 (H) 08/21/2022   HGBA1C 9.0 (H) 11/27/2018   Lab Results  Component Value Date   CHOL 69 09/05/2022   Lab Results  Component Value Date   HDL 18 (L) 09/05/2022   Lab Results  Component Value Date   LDLCALC 34 09/05/2022   Lab Results  Component Value Date   TRIG 87 09/05/2022   Lab Results  Component Value Date   CHOLHDL 3.8 09/05/2022   Lab Results  Component Value Date   CREATININE 0.70 08/18/2023   No results found for: GFR Lab Results  Component Value Date   MICROALBUR 1.2 09/18/2023      Component Value Date/Time   NA 136 08/18/2023 1814   NA 138 05/02/2017 1408   K 3.6 08/18/2023 1814   CL 98 08/18/2023 1814   CO2 26  08/18/2023 1814   GLUCOSE 257 (H) 08/18/2023 1814   BUN 11 08/18/2023 1814   BUN 8 05/02/2017 1408   CREATININE 0.70 08/18/2023 1814   CREATININE 0.84 12/04/2017 1446   CALCIUM  8.8 (L) 08/18/2023 1814   CALCIUM  8.6 05/29/2023 0000   PROT 7.0 08/18/2023 1814   PROT 6.8 08/22/2022 1326   ALBUMIN 2.9 (L) 08/18/2023 1814   ALBUMIN 4.0 08/22/2022 1326   AST 19 08/18/2023 1814   ALT 19 08/18/2023 1814   ALKPHOS 80 08/18/2023 1814   BILITOT 0.4 08/18/2023 1814   BILITOT 0.4 08/22/2022 1326   GFRNONAA >60 08/18/2023 1814   GFRAA >60 11/27/2018 0931      Latest Ref Rng & Units 08/18/2023    6:14 PM 05/29/2023   12:00 AM 04/21/2023   11:04 AM  BMP  Glucose 70 - 99 mg/dL 742   804   BUN 8 - 23 mg/dL 11   8   Creatinine 9.38 - 1.24 mg/dL 9.29   9.51   Sodium 864 - 145 mmol/L 136   135   Potassium 3.5 - 5.1 mmol/L 3.6   3.0   Chloride 98 - 111 mmol/L 98   99   CO2 22 - 32 mmol/L 26   24   Calcium  8.9 - 10.3 mg/dL 8.8  8.6  7.9        Component Value Date/Time   WBC 7.6 08/18/2023 1814   RBC 4.88 08/18/2023 1814   HGB 13.2 08/18/2023 1814   HGB 14.9 05/02/2017 1408   HCT 43.2 08/18/2023 1814   HCT 43.3 05/02/2017 1408   PLT 301 08/18/2023 1814   PLT 235 05/02/2017 1408   MCV 88.5 08/18/2023 1814   MCV 85 05/02/2017 1408   MCH 27.0 08/18/2023 1814   MCHC 30.6 08/18/2023 1814   RDW 15.4 08/18/2023 1814   RDW 14.6 05/02/2017 1408   LYMPHSABS 1.1 08/18/2023 1814   LYMPHSABS 1.4 05/02/2017 1408   MONOABS 0.7 08/18/2023 1814   EOSABS 0.2 08/18/2023 1814   EOSABS 0.2 05/02/2017 1408   BASOSABS 0.1 08/18/2023 1814   BASOSABS 0.0 05/02/2017 1408     Parts of this note may have been  dictated using voice recognition software. There may be variances in spelling and vocabulary which are unintentional. Not all errors are proofread. Please notify the dino if any discrepancies are noted or if the meaning of any statement is not clear.

## 2023-10-23 NOTE — Patient Instructions (Signed)
 Will recommend the following: Semglee  insulin  70 units in morning Jardiance  25 mg every day  Metformin  XR 500mg  2 pills twice a day Novolog  on scale: 241-300=6 u, 301-350=8 u, 351-400= 10 u, >400 = 12 u

## 2023-10-24 ENCOUNTER — Ambulatory Visit: Payer: Medicare (Managed Care) | Admitting: Podiatry

## 2023-10-24 ENCOUNTER — Encounter: Payer: Self-pay | Admitting: "Endocrinology

## 2023-10-26 ENCOUNTER — Inpatient Hospital Stay (HOSPITAL_COMMUNITY)
Admission: EM | Admit: 2023-10-26 | Discharge: 2023-10-30 | DRG: 872 | Disposition: A | Discharge status: Home Health | Payer: Medicare (Managed Care) | Attending: Internal Medicine | Admitting: Internal Medicine

## 2023-10-26 ENCOUNTER — Encounter (HOSPITAL_COMMUNITY): Payer: Self-pay

## 2023-10-26 ENCOUNTER — Other Ambulatory Visit: Payer: Self-pay

## 2023-10-26 DIAGNOSIS — I70201 Unspecified atherosclerosis of native arteries of extremities, right leg: Secondary | ICD-10-CM | POA: Diagnosis present

## 2023-10-26 DIAGNOSIS — Z720 Tobacco use: Secondary | ICD-10-CM | POA: Diagnosis present

## 2023-10-26 DIAGNOSIS — R109 Unspecified abdominal pain: Secondary | ICD-10-CM | POA: Diagnosis present

## 2023-10-26 DIAGNOSIS — I10 Essential (primary) hypertension: Secondary | ICD-10-CM | POA: Diagnosis present

## 2023-10-26 DIAGNOSIS — F17210 Nicotine dependence, cigarettes, uncomplicated: Secondary | ICD-10-CM | POA: Diagnosis present

## 2023-10-26 DIAGNOSIS — E1143 Type 2 diabetes mellitus with diabetic autonomic (poly)neuropathy: Secondary | ICD-10-CM | POA: Diagnosis present

## 2023-10-26 DIAGNOSIS — Z833 Family history of diabetes mellitus: Secondary | ICD-10-CM

## 2023-10-26 DIAGNOSIS — R7881 Bacteremia: Secondary | ICD-10-CM | POA: Insufficient documentation

## 2023-10-26 DIAGNOSIS — R824 Acetonuria: Secondary | ICD-10-CM | POA: Diagnosis present

## 2023-10-26 DIAGNOSIS — R197 Diarrhea, unspecified: Secondary | ICD-10-CM | POA: Diagnosis not present

## 2023-10-26 DIAGNOSIS — M159 Polyosteoarthritis, unspecified: Secondary | ICD-10-CM | POA: Diagnosis present

## 2023-10-26 DIAGNOSIS — A4159 Other Gram-negative sepsis: Secondary | ICD-10-CM | POA: Diagnosis not present

## 2023-10-26 DIAGNOSIS — I6529 Occlusion and stenosis of unspecified carotid artery: Secondary | ICD-10-CM | POA: Diagnosis present

## 2023-10-26 DIAGNOSIS — E1140 Type 2 diabetes mellitus with diabetic neuropathy, unspecified: Secondary | ICD-10-CM | POA: Diagnosis present

## 2023-10-26 DIAGNOSIS — Z716 Tobacco abuse counseling: Secondary | ICD-10-CM

## 2023-10-26 DIAGNOSIS — A419 Sepsis, unspecified organism: Secondary | ICD-10-CM | POA: Insufficient documentation

## 2023-10-26 DIAGNOSIS — E860 Dehydration: Secondary | ICD-10-CM | POA: Diagnosis present

## 2023-10-26 DIAGNOSIS — Z79891 Long term (current) use of opiate analgesic: Secondary | ICD-10-CM

## 2023-10-26 DIAGNOSIS — E785 Hyperlipidemia, unspecified: Secondary | ICD-10-CM | POA: Diagnosis present

## 2023-10-26 DIAGNOSIS — N12 Tubulo-interstitial nephritis, not specified as acute or chronic: Principal | ICD-10-CM | POA: Diagnosis present

## 2023-10-26 DIAGNOSIS — Z794 Long term (current) use of insulin: Secondary | ICD-10-CM

## 2023-10-26 DIAGNOSIS — K3184 Gastroparesis: Secondary | ICD-10-CM | POA: Diagnosis present

## 2023-10-26 DIAGNOSIS — M069 Rheumatoid arthritis, unspecified: Secondary | ICD-10-CM | POA: Diagnosis present

## 2023-10-26 DIAGNOSIS — B961 Klebsiella pneumoniae [K. pneumoniae] as the cause of diseases classified elsewhere: Secondary | ICD-10-CM | POA: Diagnosis present

## 2023-10-26 DIAGNOSIS — G894 Chronic pain syndrome: Secondary | ICD-10-CM | POA: Diagnosis present

## 2023-10-26 DIAGNOSIS — Z993 Dependence on wheelchair: Secondary | ICD-10-CM

## 2023-10-26 DIAGNOSIS — Z89611 Acquired absence of right leg above knee: Secondary | ICD-10-CM

## 2023-10-26 DIAGNOSIS — R1011 Right upper quadrant pain: Secondary | ICD-10-CM | POA: Diagnosis not present

## 2023-10-26 DIAGNOSIS — N390 Urinary tract infection, site not specified: Secondary | ICD-10-CM | POA: Diagnosis present

## 2023-10-26 DIAGNOSIS — Z7952 Long term (current) use of systemic steroids: Secondary | ICD-10-CM

## 2023-10-26 DIAGNOSIS — I639 Cerebral infarction, unspecified: Secondary | ICD-10-CM | POA: Diagnosis present

## 2023-10-26 DIAGNOSIS — Z8673 Personal history of transient ischemic attack (TIA), and cerebral infarction without residual deficits: Secondary | ICD-10-CM

## 2023-10-26 DIAGNOSIS — E871 Hypo-osmolality and hyponatremia: Secondary | ICD-10-CM | POA: Diagnosis present

## 2023-10-26 DIAGNOSIS — Z7985 Long-term (current) use of injectable non-insulin antidiabetic drugs: Secondary | ICD-10-CM

## 2023-10-26 DIAGNOSIS — R110 Nausea: Secondary | ICD-10-CM | POA: Diagnosis present

## 2023-10-26 DIAGNOSIS — Z79899 Other long term (current) drug therapy: Secondary | ICD-10-CM

## 2023-10-26 DIAGNOSIS — Z7984 Long term (current) use of oral hypoglycemic drugs: Secondary | ICD-10-CM

## 2023-10-26 DIAGNOSIS — I779 Disorder of arteries and arterioles, unspecified: Secondary | ICD-10-CM | POA: Diagnosis present

## 2023-10-26 DIAGNOSIS — G8929 Other chronic pain: Secondary | ICD-10-CM | POA: Diagnosis present

## 2023-10-26 DIAGNOSIS — J449 Chronic obstructive pulmonary disease, unspecified: Secondary | ICD-10-CM | POA: Diagnosis present

## 2023-10-26 DIAGNOSIS — Z7902 Long term (current) use of antithrombotics/antiplatelets: Secondary | ICD-10-CM

## 2023-10-26 DIAGNOSIS — Z7982 Long term (current) use of aspirin: Secondary | ICD-10-CM

## 2023-10-26 DIAGNOSIS — Z9049 Acquired absence of other specified parts of digestive tract: Secondary | ICD-10-CM

## 2023-10-26 DIAGNOSIS — G4730 Sleep apnea, unspecified: Secondary | ICD-10-CM | POA: Diagnosis present

## 2023-10-26 DIAGNOSIS — E1165 Type 2 diabetes mellitus with hyperglycemia: Secondary | ICD-10-CM | POA: Diagnosis present

## 2023-10-26 DIAGNOSIS — I739 Peripheral vascular disease, unspecified: Secondary | ICD-10-CM | POA: Diagnosis present

## 2023-10-26 DIAGNOSIS — Z888 Allergy status to other drugs, medicaments and biological substances status: Secondary | ICD-10-CM

## 2023-10-26 LAB — COMPREHENSIVE METABOLIC PANEL WITH GFR
ALT: 18 U/L (ref 0–44)
AST: 20 U/L (ref 15–41)
Albumin: 3 g/dL — ABNORMAL LOW (ref 3.5–5.0)
Alkaline Phosphatase: 83 U/L (ref 38–126)
Anion gap: 13 (ref 5–15)
BUN: 8 mg/dL (ref 8–23)
CO2: 26 mmol/L (ref 22–32)
Calcium: 9 mg/dL (ref 8.9–10.3)
Chloride: 95 mmol/L — ABNORMAL LOW (ref 98–111)
Creatinine, Ser: 0.89 mg/dL (ref 0.61–1.24)
GFR, Estimated: >60 mL/min (ref >60)
Glucose, Bld: 264 mg/dL — ABNORMAL HIGH (ref 70–99)
Potassium: 4.2 mmol/L (ref 3.5–5.1)
Sodium: 134 mmol/L — ABNORMAL LOW (ref 135–145)
Total Bilirubin: 1 mg/dL (ref 0.0–1.2)
Total Protein: 7.6 g/dL (ref 6.5–8.1)

## 2023-10-26 LAB — CBC
HCT: 47.7 % (ref 39.0–52.0)
Hemoglobin: 14.9 g/dL (ref 13.0–17.0)
MCH: 26.5 pg (ref 26.0–34.0)
MCHC: 31.2 g/dL (ref 30.0–36.0)
MCV: 84.7 fL (ref 80.0–100.0)
Platelets: 314 K/uL (ref 150–400)
RBC: 5.63 MIL/uL (ref 4.22–5.81)
RDW: 16.5 % — ABNORMAL HIGH (ref 11.5–15.5)
WBC: 11.2 K/uL — ABNORMAL HIGH (ref 4.0–10.5)
nRBC: 0 % (ref 0.0–0.2)

## 2023-10-26 LAB — LIPASE, BLOOD: Lipase: 23 U/L (ref 11–51)

## 2023-10-26 NOTE — ED Notes (Signed)
 Pt cannot urinate at this time, cup supplied

## 2023-10-26 NOTE — ED Triage Notes (Signed)
 Pt reports taking several doses of phenergan  at home to help with the nausea and abdominal pain pt reporting abdominal pain starting yesterday morning.

## 2023-10-27 ENCOUNTER — Emergency Department (HOSPITAL_COMMUNITY): Payer: Medicare (Managed Care)

## 2023-10-27 DIAGNOSIS — R109 Unspecified abdominal pain: Secondary | ICD-10-CM | POA: Diagnosis not present

## 2023-10-27 DIAGNOSIS — R110 Nausea: Secondary | ICD-10-CM

## 2023-10-27 DIAGNOSIS — R8271 Bacteriuria: Secondary | ICD-10-CM | POA: Diagnosis not present

## 2023-10-27 DIAGNOSIS — K3184 Gastroparesis: Secondary | ICD-10-CM

## 2023-10-27 DIAGNOSIS — R8281 Pyuria: Secondary | ICD-10-CM

## 2023-10-27 DIAGNOSIS — N12 Tubulo-interstitial nephritis, not specified as acute or chronic: Principal | ICD-10-CM | POA: Diagnosis present

## 2023-10-27 LAB — URINALYSIS, ROUTINE W REFLEX MICROSCOPIC
Bilirubin Urine: NEGATIVE
Glucose, UA: >=500 mg/dL — AB
Ketones, ur: 80 mg/dL — AB
Nitrite: POSITIVE — AB
Protein, ur: 30 mg/dL — AB
Specific Gravity, Urine: 1.023 (ref 1.005–1.030)
WBC, UA: >50 WBC/hpf (ref 0–5)
pH: 5 (ref 5.0–8.0)

## 2023-10-27 LAB — GLUCOSE, CAPILLARY
Glucose-Capillary: 242 mg/dL — ABNORMAL HIGH (ref 70–99)
Glucose-Capillary: 251 mg/dL — ABNORMAL HIGH (ref 70–99)
Glucose-Capillary: 283 mg/dL — ABNORMAL HIGH (ref 70–99)

## 2023-10-27 LAB — CBG MONITORING, ED
Glucose-Capillary: 248 mg/dL — ABNORMAL HIGH (ref 70–99)
Glucose-Capillary: 250 mg/dL — ABNORMAL HIGH (ref 70–99)

## 2023-10-27 LAB — LACTIC ACID, PLASMA: Lactic Acid, Venous: 1.4 mmol/L (ref 0.5–1.9)

## 2023-10-27 MED ORDER — ASPIRIN 81 MG PO CHEW
81.0000 mg | CHEWABLE_TABLET | Freq: Every day | ORAL | Status: DC
  Administered 2023-10-27 – 2023-10-30 (×4): 81 mg via ORAL
  Filled 2023-10-27 (×4): qty 1

## 2023-10-27 MED ORDER — PANTOPRAZOLE SODIUM 40 MG PO TBEC
40.0000 mg | DELAYED_RELEASE_TABLET | Freq: Every day | ORAL | Status: DC
  Administered 2023-10-28 – 2023-10-30 (×3): 40 mg via ORAL
  Filled 2023-10-27 (×3): qty 1

## 2023-10-27 MED ORDER — OXYMETAZOLINE HCL 0.05 % NA SOLN
1.0000 | Freq: Two times a day (BID) | NASAL | Status: AC
  Administered 2023-10-27 – 2023-10-29 (×4): 1 via NASAL
  Filled 2023-10-27 (×2): qty 30

## 2023-10-27 MED ORDER — NICOTINE 21 MG/24HR TD PT24
21.0000 mg | MEDICATED_PATCH | Freq: Every day | TRANSDERMAL | Status: DC
  Administered 2023-10-27 – 2023-10-30 (×4): 21 mg via TRANSDERMAL
  Filled 2023-10-27 (×4): qty 1

## 2023-10-27 MED ORDER — METOCLOPRAMIDE HCL 5 MG/ML IJ SOLN
5.0000 mg | Freq: Three times a day (TID) | INTRAMUSCULAR | Status: DC
  Administered 2023-10-27 (×3): 5 mg via INTRAVENOUS
  Filled 2023-10-27 (×3): qty 2

## 2023-10-27 MED ORDER — LACTATED RINGERS IV SOLN: INTRAVENOUS | Status: DC

## 2023-10-27 MED ORDER — SODIUM CHLORIDE 0.9 % IV SOLN
1.0000 g | Freq: Once | INTRAVENOUS | Status: AC
  Administered 2023-10-27: 1 g via INTRAVENOUS
  Filled 2023-10-27: qty 10

## 2023-10-27 MED ORDER — METOPROLOL SUCCINATE ER 25 MG PO TB24
25.0000 mg | ORAL_TABLET | Freq: Every day | ORAL | Status: DC
  Administered 2023-10-27 – 2023-10-29 (×3): 25 mg via ORAL
  Filled 2023-10-27 (×3): qty 1

## 2023-10-27 MED ORDER — IPRATROPIUM-ALBUTEROL 0.5-2.5 (3) MG/3ML IN SOLN: 3.0000 mL | Freq: Four times a day (QID) | RESPIRATORY_TRACT | Status: DC | PRN

## 2023-10-27 MED ORDER — ACETAMINOPHEN 650 MG RE SUPP: 650.0000 mg | Freq: Four times a day (QID) | RECTAL | Status: DC | PRN

## 2023-10-27 MED ORDER — ENOXAPARIN SODIUM 40 MG/0.4ML IJ SOSY
40.0000 mg | PREFILLED_SYRINGE | INTRAMUSCULAR | Status: DC
  Administered 2023-10-27 – 2023-10-30 (×4): 40 mg via SUBCUTANEOUS
  Filled 2023-10-27 (×4): qty 0.4

## 2023-10-27 MED ORDER — INSULIN GLARGINE 100 UNIT/ML ~~LOC~~ SOLN
30.0000 [IU] | Freq: Every day | SUBCUTANEOUS | Status: DC
  Administered 2023-10-27 – 2023-10-28 (×2): 30 [IU] via SUBCUTANEOUS
  Filled 2023-10-27 (×2): qty 0.3

## 2023-10-27 MED ORDER — ACETAMINOPHEN 325 MG PO TABS: 650.0000 mg | ORAL_TABLET | Freq: Four times a day (QID) | ORAL | Status: DC | PRN

## 2023-10-27 MED ORDER — DEXTROSE-SODIUM CHLORIDE 5-0.9 % IV SOLN: INTRAVENOUS | Status: DC

## 2023-10-27 MED ORDER — ONDANSETRON HCL 4 MG/2ML IJ SOLN
4.0000 mg | Freq: Once | INTRAMUSCULAR | Status: AC
  Administered 2023-10-27: 4 mg via INTRAVENOUS
  Filled 2023-10-27: qty 2

## 2023-10-27 MED ORDER — ONDANSETRON HCL 4 MG/2ML IJ SOLN: 4.0000 mg | Freq: Four times a day (QID) | INTRAMUSCULAR | Status: DC | PRN

## 2023-10-27 MED ORDER — IOHEXOL 350 MG/ML SOLN
100.0000 mL | Freq: Once | INTRAVENOUS | Status: AC | PRN
  Administered 2023-10-27: 100 mL via INTRAVENOUS

## 2023-10-27 MED ORDER — SODIUM CHLORIDE 0.9% FLUSH
3.0000 mL | Freq: Two times a day (BID) | INTRAVENOUS | Status: DC
  Administered 2023-10-27 – 2023-10-30 (×7): 3 mL via INTRAVENOUS

## 2023-10-27 MED ORDER — ONDANSETRON HCL 4 MG PO TABS
4.0000 mg | ORAL_TABLET | Freq: Four times a day (QID) | ORAL | Status: DC | PRN
  Administered 2023-10-27: 4 mg via ORAL
  Filled 2023-10-27: qty 1

## 2023-10-27 MED ORDER — KETOROLAC TROMETHAMINE 15 MG/ML IJ SOLN
15.0000 mg | Freq: Four times a day (QID) | INTRAMUSCULAR | Status: DC
  Administered 2023-10-27 – 2023-10-28 (×5): 15 mg via INTRAVENOUS
  Filled 2023-10-27 (×5): qty 1

## 2023-10-27 MED ORDER — MORPHINE SULFATE (PF) 2 MG/ML IV SOLN
1.0000 mg | INTRAVENOUS | Status: DC | PRN
  Administered 2023-10-27 (×2): 2 mg via INTRAVENOUS
  Filled 2023-10-27: qty 1

## 2023-10-27 MED ORDER — ONDANSETRON HCL 4 MG PO TABS
4.0000 mg | ORAL_TABLET | Freq: Four times a day (QID) | ORAL | Status: DC | PRN
Start: 2023-10-27 — End: 2023-10-27

## 2023-10-27 MED ORDER — LACTATED RINGERS IV BOLUS
1000.0000 mL | Freq: Once | INTRAVENOUS | Status: AC
  Administered 2023-10-27: 1000 mL via INTRAVENOUS

## 2023-10-27 MED ORDER — INSULIN GLARGINE-YFGN 100 UNIT/ML ~~LOC~~ SOPN: 30.0000 [IU] | PEN_INJECTOR | SUBCUTANEOUS | Status: DC

## 2023-10-27 MED ORDER — FENTANYL CITRATE PF 50 MCG/ML IJ SOSY
50.0000 ug | PREFILLED_SYRINGE | INTRAMUSCULAR | Status: DC | PRN
  Administered 2023-10-27 (×2): 50 ug via INTRAVENOUS
  Filled 2023-10-27 (×2): qty 1

## 2023-10-27 MED ORDER — SODIUM CHLORIDE 0.9 % IV SOLN
1.0000 g | INTRAVENOUS | Status: DC
  Administered 2023-10-28: 1 g via INTRAVENOUS
  Filled 2023-10-27: qty 10

## 2023-10-27 MED ORDER — INSULIN ASPART 100 UNIT/ML IJ SOLN
0.0000 [IU] | INTRAMUSCULAR | Status: DC
  Administered 2023-10-27 (×4): 3 [IU] via SUBCUTANEOUS
  Administered 2023-10-28 (×2): 2 [IU] via SUBCUTANEOUS
  Administered 2023-10-28: 3 [IU] via SUBCUTANEOUS
  Administered 2023-10-28: 2 [IU] via SUBCUTANEOUS
  Administered 2023-10-28: 3 [IU] via SUBCUTANEOUS
  Administered 2023-10-28: 2 [IU] via SUBCUTANEOUS
  Administered 2023-10-29: 1 [IU] via SUBCUTANEOUS
  Administered 2023-10-29: 5 [IU] via SUBCUTANEOUS
  Administered 2023-10-29 (×2): 2 [IU] via SUBCUTANEOUS
  Administered 2023-10-29 (×2): 5 [IU] via SUBCUTANEOUS
  Administered 2023-10-30: 7 [IU] via SUBCUTANEOUS
  Administered 2023-10-30: 1 [IU] via SUBCUTANEOUS

## 2023-10-27 MED ORDER — PREGABALIN 75 MG PO CAPS
150.0000 mg | ORAL_CAPSULE | Freq: Two times a day (BID) | ORAL | Status: DC
  Administered 2023-10-27 – 2023-10-30 (×7): 150 mg via ORAL
  Filled 2023-10-27 (×7): qty 2

## 2023-10-27 MED ORDER — CLOPIDOGREL BISULFATE 75 MG PO TABS
75.0000 mg | ORAL_TABLET | Freq: Every day | ORAL | Status: DC
  Administered 2023-10-27 – 2023-10-30 (×4): 75 mg via ORAL
  Filled 2023-10-27 (×4): qty 1

## 2023-10-27 NOTE — H&P (Addendum)
 History and Physical    Patient: Brian Zavala FMW:[id number] DOB: [birth year] DOA: 10/26/2023 DOS: the patient was seen and examined on 10/27/2023 PCP: Cloria Annabella CROME, DO  Patient coming from: Home  Chief Complaint:  Chief Complaint  Patient presents with   Abdominal Pain   HPI: Brian Zavala is a 73 y.o. male with medical history significant of diabetes mellitus on insulin , rheumatoid arthritis on disease modifying agent/Biologics, right BKA, tobacco abuse, COPD, carotid disease, history of prior CVA, hypertension, and recurrent nausea.  Patient underwent a left transcatheter revascularization of carotid artery in 2024.  Patient presented to the ER with reports of nausea and abdominal pain for 24 hours.  He was taking Phenergan  at home to see if this would help his symptoms but unfortunately this did not help.  The nausea started before the abdominal pain.  In the ED he was afebrile, vital signs otherwise stable.  Mild leukocytosis 11,200.  Sodium slightly low at 134 consistent with reported history of emesis, glucose 264.  Lactic acid was 1.4.  Urinalysis was highly abnormal with hazy appearance, greater than 500 glucose, small amount of hemoglobin, 80 ketones, large amount of leukocytes, nitrite positive, protein 30, bacteria many, squamous epithelial 0-5, WBCs greater than 50.  Urine culture and blood cultures obtained in the ED and patient was given a dose of Rocephin .  CT angio of abdomen and pelvis showed no etiology for patient's abdominal pain.  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  Nonvascular portion of the exam revealed chronic changes as described in previous CT from 08/18/2023.  Of note genitourinary imaging was unremarkable.  Hospitalist service has been asked to evaluate the patient for admission  Upon my evaluation of the patient he had not received any pain medicine since just after 2 was experienced significant lower/below the  umbilicus abdominal pain.  He was very uncomfortable.  The nurse was at the bedside administering pain medications during my evaluation.  He states his pain began a few days ago.  Because of the degree of pain he was having difficulty staying focused to answer questions.  My attending saw him after he was administered both fentanyl  and a dose of IV Toradol .  He reported no issues with UTI type symptoms and no fevers or chills at home.   Review of Systems: As mentioned in the history of present illness. All other systems reviewed and are negative. Past Medical History:  Diagnosis Date   Arthritis    pretty much all over   Cellulitis of left foot    Colitis    COPD (chronic obstructive pulmonary disease) (HCC)    Dyspnea    Hx of BKA, right (HCC)    Hyperlipidemia    Hypertension    Nausea and vomiting 08/20/2022   Neuromuscular disorder (HCC)    neuropathy legs   Neuropathy    legs   Osteomyelitis (HCC) 04/2018   4th toe left foot   Osteomyelitis of fourth toe of left foot (HCC) 04/19/2018   Peripheral vascular disease (HCC)    Rheumatoid arthritis (HCC)    Sleep apnea    does not use a cpap   Stroke (HCC)    Type II diabetes mellitus (HCC)    Wears glasses    Wears partial dentures    top and bottom partials   Past Surgical History:  Procedure Laterality Date   ABDOMINAL AORTOGRAM W/LOWER EXTREMITY Bilateral 10/19/2018   Procedure: ABDOMINAL AORTOGRAM W/LOWER  EXTREMITY;  Surgeon: Court Dorn PARAS, MD;  Location: [facility] INVASIVE CV LAB;  Service: Cardiovascular;  Laterality: Bilateral;   ABOVE KNEE LEG AMPUTATION Right 11/27/2018   AMPUTATION Left 04/22/2018   Procedure: LEFT FOOT 4TH RAY AMPUTATION;  Surgeon: Harden Jerona GAILS, MD;  Location: [facility] OR;  Service: Orthopedics;  Laterality: Left;   AMPUTATION Right 11/27/2018   Procedure: RIGHT ABOVE KNEE AMPUTATION;  Surgeon: Harden Jerona GAILS, MD;  Location: [facility] OR;  Service: Orthopedics;  Laterality: Right;   BACK SURGERY     BIOPSY   04/21/2023   Procedure: BIOPSY;  Surgeon: Charlanne Groom, MD;  Location: [facility];  Service: Gastroenterology;;   CHOLECYSTECTOMY N/A 10/26/2013   Procedure: LAPAROSCOPIC CHOLECYSTECTOMY WITH INTRAOPERATIVE CHOLANGIOGRAM;  Surgeon: Debby LABOR. Cornett, MD;  Location: [facility] OR;  Service: General;  Laterality: N/A;   COLONOSCOPY     COLONOSCOPY WITH PROPOFOL  N/A 04/21/2023   Procedure: COLONOSCOPY WITH PROPOFOL ;  Surgeon: Charlanne Groom, MD;  Location: [facility];  Service: Gastroenterology;  Laterality: N/A;   CYST EXCISION Bilateral 02/15/2015   Procedure: LEFT INDEX FINGER AND RIGHT MIDDLE FINGER NODULE EXCISION;  Surgeon: Kay CHRISTELLA Cummins, MD;  Location: [facility];  Service: Orthopedics;  Laterality: Bilateral;   CYST EXCISION PERINEAL N/A 12/08/2012   Procedure: CYST EXCISION PERINeum;  Surgeon: Debby A. Cornett, MD;  Location: [facility];  Service: General;  Laterality: N/A;   ESOPHAGOGASTRODUODENOSCOPY (EGD) WITH PROPOFOL  N/A 04/21/2023   Procedure: ESOPHAGOGASTRODUODENOSCOPY (EGD) WITH PROPOFOL ;  Surgeon: Charlanne Groom, MD;  Location: [facility];  Service: Gastroenterology;  Laterality: N/A;   FOOT AMPUTATION Right 2005   I & D EXTREMITY Right 03/07/2017   Procedure: EXCISION FIBULAR HEAD RIGHT BELOW KNEE AMPUTATION;  Surgeon: Harden Jerona GAILS, MD;  Location: [facility] OR;  Service: Orthopedics;  Laterality: Right;   INCISE AND DRAIN ABCESS  12/02/2014   PERINEAL ABSCESS   INCISION AND DRAINAGE PERIRECTAL ABSCESS Left 12/02/2014   Procedure: IRRIGATION AND DEBRIDEMENT PERINEAL ABSCESS;  Surgeon: Vicenta Poli, MD;  Location: [facility] OR;  Service: General;  Laterality: Left;   LEG AMPUTATION BELOW KNEE Right 2005   LOWER EXTREMITY ANGIOGRAPHY N/A 05/05/2017   Procedure: LOWER EXTREMITY ANGIOGRAPHY;  Surgeon: Court Dorn PARAS, MD;  Location: [facility];  Service: Cardiovascular;  Laterality: N/A;   LUMBAR DISC SURGERY  82,90   ruptured disc   PERIPHERAL VASCULAR  INTERVENTION Right 05/05/2017   Procedure: PERIPHERAL VASCULAR INTERVENTION;  Surgeon: Court Dorn PARAS, MD;  Location: [facility];  Service: Cardiovascular;  Laterality: Right;   PERIPHERAL VASCULAR INTERVENTION Right 10/19/2018   Procedure: PERIPHERAL VASCULAR INTERVENTION;  Surgeon: Court Dorn PARAS, MD;  Location: [facility];  Service: Cardiovascular;  Laterality: Right;   SCAR REVISION Right 2005   @ amputation   STUMP REVISION Right 08/21/2018   Procedure: REVISION RIGHT BELOW KNEE AMPUTATION, EXCISION FIBULA;  Surgeon: Harden Jerona GAILS, MD;  Location: [facility] OR;  Service: Orthopedics;  Laterality: Right;   STUMP REVISION Right 09/09/2018   Procedure: REVISION RIGHT BELOW KNEE AMPUTATION;  Surgeon: Harden Jerona GAILS, MD;  Location: [facility] OR;  Service: Orthopedics;  Laterality: Right;   TOE AMPUTATION Right 2005   TRANSCAROTID ARTERY REVASCULARIZATION  Left 09/04/2022   Procedure: Left Transcarotid Artery Revascularization;  Surgeon: Serene Gaile ORN, MD;  Location: [facility] OR;  Service: Vascular;  Laterality: Left;   ULTRASOUND GUIDANCE FOR VASCULAR ACCESS Right 09/04/2022   Procedure: ULTRASOUND GUIDANCE FOR VASCULAR ACCESS, RIGHT FEMORAL VEIN;  Surgeon: Serene,  Gaile ORN, MD;  Location: [facility] OR;  Service: Vascular;  Laterality: Right;   Social History:  reports that he has been smoking cigarettes. He started smoking about 58 years ago. He has a 57.8 pack-year smoking history. He has never used smokeless tobacco. He reports that he does not drink alcohol and does not use drugs.  Allergies  Allergen Reactions   Varenicline Nausea Only    Family History  Problem Relation Age of Onset   Hypothyroidism Mother    Diabetes Mother    Cirrhosis Father    Diabetes Maternal Grandmother    Thyroid disease Daughter     Prior to Admission medications   Medication Sig Start Date End Date Taking? Authorizing Provider  aspirin  81 MG chewable tablet Chew 81 mg by mouth daily.   Yes [provider]  calcium  carbonate (OSCAL) 1500 (600 Ca) MG TABS tablet Take 600 mg of elemental calcium  by mouth daily with breakfast.    Yes [provider]  cholecalciferol  (VITAMIN D ) 1000 UNITS tablet Take 1,000 Units by mouth daily.   Yes [provider]  clopidogrel  (PLAVIX ) 75 MG tablet Take 1 tablet (75 mg total) by mouth daily. 09/02/22  Yes Singh, Prashant K, MD  etanercept  (ENBREL ) 50 MG/ML injection Inject 50 mg into the skin once a week.   Yes [provider]  MORPHINE  SULFATE ER PO Take 1 capsule by mouth every 12 (twelve) hours.   Yes [provider]  vitamin B-12 (CYANOCOBALAMIN ) 100 MCG tablet Take 100 mcg by mouth daily.   Yes [provider]  acetaminophen  (TYLENOL ) 500 MG tablet Take 1,000 mg by mouth every 6 (six) hours as needed for moderate pain.    [provider]  atorvastatin  (LIPITOR ) 80 MG tablet Take 1 tablet (80 mg total) by mouth daily at 6 PM. 05/06/17   Henry Manuelita NOVAK, NP  Emollient (EUCERIN ADVANCED REPAIR) CREA Apply 1 application  topically 2 (two) times daily.    [provider]  empagliflozin  (JARDIANCE ) 25 MG TABS tablet Take 1 tablet (25 mg total) by mouth daily before breakfast. 10/23/23   Motwani, Komal, MD  fexofenadine (ALLEGRA) 180 MG tablet Take 180 mg by mouth daily.    [provider]  fluticasone  (FLONASE ) 50 MCG/ACT nasal spray Place 2 sprays into both nostrils daily as needed for allergies or rhinitis.    [provider]  furosemide  (LASIX ) 20 MG tablet Take 20 mg by mouth daily as needed for fluid or edema.    [provider]  Glucagon  (BAQSIMI  ONE PACK) 3 MG/DOSE POWD Place 1 Device into the nose as needed (Low blood sugar with impaired consciousness). 09/18/23   Motwani, Komal, MD  hydrochlorothiazide  (MICROZIDE ) 12.5 MG capsule Take 12.5 mg by mouth daily.    [provider]  insulin  aspart (NOVOLOG ) 100 UNIT/ML injection Inject 10 Units into the skin once.     [provider]  insulin  glargine (SEMGLEE ) 100 UNIT/ML injection Inject 0.7 mLs (70 Units total) into the skin daily. 10/23/23   Motwani, Komal, MD  leflunomide  (ARAVA ) 20 MG tablet Take 20 mg by mouth daily.    [provider]  liraglutide  (VICTOZA ) 18 MG/3ML SOPN Inject 1.8 mg into the skin daily.    [provider]  metFORMIN  (GLUCOPHAGE -XR) 500 MG 24 hr tablet Take 500 mg by mouth 2 (two) times daily with a meal.    [provider]  metoprolol  succinate (TOPROL -XL) 25 MG 24 hr tablet Take 25  mg by mouth at bedtime.    [provider]  nicotine  (NICODERM CQ  - DOSED IN MG/24 HOURS) 21 mg/24hr patch Place 21 mg onto the skin daily.    [provider]  ondansetron  (ZOFRAN ) 4 MG tablet Take 1 tablet (4 mg total) by mouth every 6 (six) hours. 08/18/23   Cleotilde Rogue, MD  ondansetron  (ZOFRAN -ODT) 4 MG disintegrating tablet Take 1 tablet (4 mg total) by mouth every 8 (eight) hours as needed for nausea or vomiting. 08/21/22   Bryn Bernardino NOVAK, MD  pantoprazole  (PROTONIX ) 40 MG tablet Take 1 tablet (40 mg total) by mouth daily before breakfast. 10/28/22   McMichael, Bayley M, PA-C  predniSONE  (DELTASONE ) 1 MG tablet Take 2 mg by mouth daily with breakfast.    [provider]  pregabalin  (LYRICA ) 150 MG capsule Take 150 mg by mouth 2 (two) times daily.    [provider]  promethazine  (PHENERGAN ) 25 MG tablet Take 25 mg by mouth every 8 (eight) hours as needed for nausea or vomiting.    [provider]  promethazine  (PHENERGAN ) 25 MG/ML injection Inject 0.5 mLs (12.5 mg total) into the vein once as needed for nausea or vomiting. If not responding to oral promethazine . 08/21/22   Bryn Bernardino NOVAK, MD  Semaglutide,0.25 or 0.5MG /DOS, (OZEMPIC, 0.25 OR 0.5 MG/DOSE,) 2 MG/1.5ML SOPN Inject 0.5 mg into the skin once a week. Patient not taking: No sig reported    [provider]  senna-docusate (SENOKOT-S) 8.6-50 MG tablet Take 1  tablet by mouth 2 (two) times daily as needed for mild constipation.    [provider]  sodium chloride  (OCEAN) 0.65 % SOLN nasal spray Place 2 sprays into both nostrils 4 (four) times daily as needed for congestion.    [provider]  Sodium Fluoride (PREVIDENT 5000 BOOSTER PLUS DT) Place 1 application  onto teeth every evening.    [provider]    Physical Exam: Vitals:   10/27/23 0520 10/27/23 0539 10/27/23 0700 10/27/23 0800  BP: (!) 146/70  122/82 126/82  Pulse: (!) 115 (!) 102 100 96  Resp: (!) 21 14 11 14   Temp:    97.7 F (36.5 C)  TempSrc:    Oral  SpO2: 95% 99% 93% 95%  Weight:      Height:       Constitutional: NAD, calm, uncomfortable and restless Respiratory: clear to auscultation bilaterally, no wheezing, no crackles. Normal respiratory effort. No accessory muscle use.  Cardiovascular: Regular rate and rhythm, no murmurs / rubs / gallops. No extremity edema. 2+ pedal pulses  Abdomen: Reproducible lower abdominal tenderness with minimal palpation, abdomen is somewhat distended but remains soft to the touch, no masses palpated. No hepatosplenomegaly. Bowel sounds positive.  Musculoskeletal: no clubbing / cyanosis. No joint deformity upper and lower extremities. Good ROM, no contractures. Normal muscle tone.  Skin: no rashes, lesions, ulcers. No induration Neurologic: CN 2-12 grossly intact. Sensation intact, Strength 5/5 x all 4 extremities.  Psychiatric: Normal judgment and insight. Alert and oriented x 3. Normal mood.     Data Reviewed:  Labs from yesterday evening at presentation: Sodium 134, potassium 4.2, chloride 95, CO2 26, glucose 264, BUN 8, creatinine 0.89, LFTs are normal except for albumin 3.0  Lactic acid 1.4  WBC 11,200, differential not obtained, hemoglobin 14.9, platelets 314,000  Hemoglobin A1c 7.5 from 8/21  Urinalysis highly abnormal as described above.  Blood cultures and urine culture pending  Imaging as  above  Assessment and  Plan: Nausea and significant abdominal pain due to presumed UTI No typical UTI symptoms and no fevers or chills but this could be influenced by his medications for RA Does have significant abdominal pain and a highly abnormal urinalysis that appears to be consistent with infection Continue Rocephin  IV Follow-up on urine culture and blood cultures Lactic acid normal and no other signs of sepsis or SIRS at this time Change to IV morphine  for pain control; I have added 2 doses of IV Toradol  every 6 hours to help with any inflammatory etiology to pain D5 normal saline at 100 cc/h  Diabetes mellitus 2 on insulin  Hemoglobin A1c elevated at 7.8 Follow CBGs and provide SSI Hold metformin  since received IV contrast Takes Semglee  72 units every morning-steroids on hold for now and is n.p.o.-while acutely ill we will give 30 units daily and continue to monitor  Rheumatoid arthritis on Biologics/associated chronic pain syndrome On morphine  ER every 12 hours-dose not clarified on the med rec-continuing IV morphine  since on clear liquids and may not tolerate oral narcotics in context of ongoing nausea and some vomiting -may need to transition to morphine  PCA for adequate pain control On Enbrel  once weekly with last dose on 8/24 On Arava  daily Hold Biologics while acutely ill with suspected infection Also on chronic prednisone  2 mg daily with breakfast-this is very low-dose and likely would not precipitate acute adrenal insufficiency symptoms Resume Lyrica  if patient can tolerate and not have emesis  Hypertension Appears dehydrated so hold hydrochlorothiazide  Resume metoprolol -already having tachycardia but this is likely secondary to volume depletion and ongoing pain  PVD with history of right BKA Continue Plavix  and baby aspirin   Carotid disease with history of left transcatheter revascularization in 2024 Continue Plavix  and baby aspirin   COPD with ongoing tobacco  abuse Provide DuoNeb as needed Nicotine  patch    Advance Care Planning:   Code Status: Full Code   VTE prophylaxis: Lovenox   Consults: None  Family Communication: Patient only  Severity of Illness: The appropriate patient status for this patient is OBSERVATION. Observation status is judged to be reasonable and necessary in order to provide the required intensity of service to ensure the patient's safety. The patient's presenting symptoms, physical exam findings, and initial radiographic and laboratory data in the context of their medical condition is felt to place them at decreased risk for further clinical deterioration. Furthermore, it is anticipated that the patient will be medically stable for discharge from the hospital within 2 midnights of admission.   Author: Isaiah Lever, NP 10/27/2023 9:50 AM  For on call review [email or web address].

## 2023-10-27 NOTE — Progress Notes (Addendum)
 Pt c/o not feeling good. Concern from nursing staff regarding possible ST elevation in anterior leads. EKG obtained. ST with LVH. No STE. Consistent with prior EKGs.

## 2023-10-27 NOTE — ED Provider Notes (Signed)
 [facility] Provider Note   CSN: [id number] Arrival date & time: 10/26/23  2133     History Chief Complaint  Patient presents with   Abdominal Pain    HPI Brian Zavala is a 73 y.o. male presenting for chief complaint of abdominal pain.  Expansive medical history.  73 year old male. States that since yesterday he started having severe right upper quadrant abdominal pain radiating into his right lower quadrant to his abdomen.  Denies fevers chills, syncope shortness of breath. Otherwise ambulatory tolerating p.o. intake prior to this event.  Right BKA. No known sick contacts.  S/p cholecystectomy in the past. Episode similar in June diagnosis gastroparesis.  Follows with gastroenterology for chronic abdominal pain.  Patient's recorded medical, surgical, social, medication list and allergies were reviewed in the Snapshot window as part of the initial history.   Review of Systems   Review of Systems  Constitutional:  Negative for chills and fever.  HENT:  Negative for ear pain and sore throat.   Eyes:  Negative for pain and visual disturbance.  Respiratory:  Negative for cough and shortness of breath.   Cardiovascular:  Negative for chest pain and palpitations.  Gastrointestinal:  Positive for abdominal pain and nausea. Negative for vomiting.  Genitourinary:  Negative for dysuria and hematuria.  Musculoskeletal:  Negative for arthralgias and back pain.  Skin:  Negative for color change and rash.  Neurological:  Negative for seizures and syncope.  All other systems reviewed and are negative.   Physical Exam Updated Vital Signs BP (!) 146/70   Pulse (!) 102   Temp 98.2 F (36.8 C)   Resp 14   Ht 6' 1 (1.854 m)   Wt 75.8 kg   SpO2 99%   BMI 22.03 kg/m  Physical Exam Vitals and nursing note reviewed.  Constitutional:      General: He is not in acute distress.    Appearance: He is well-developed.  HENT:     Head:  Normocephalic and atraumatic.  Eyes:     Conjunctiva/sclera: Conjunctivae normal.  Cardiovascular:     Rate and Rhythm: Normal rate and regular rhythm.     Heart sounds: No murmur heard. Pulmonary:     Effort: Pulmonary effort is normal. No respiratory distress.     Breath sounds: Normal breath sounds.  Abdominal:     Palpations: Abdomen is soft.     Tenderness: There is abdominal tenderness.  Musculoskeletal:        General: No swelling.     Cervical back: Neck supple.  Skin:    General: Skin is warm and dry.     Capillary Refill: Capillary refill takes less than 2 seconds.  Neurological:     Mental Status: He is alert.  Psychiatric:        Mood and Affect: Mood normal.      ED Course/ Medical Decision Making/ A&P    Procedures Procedures   Medications Ordered in ED Medications  fentaNYL  (SUBLIMAZE ) injection 50 mcg (50 mcg Intravenous Given 10/27/23 0234)  ondansetron  (ZOFRAN ) injection 4 mg (4 mg Intravenous Given 10/27/23 0235)  lactated ringers  bolus 1,000 mL (0 mLs Intravenous Stopped 10/27/23 0420)  iohexol  (OMNIPAQUE ) 350 MG/ML injection 100 mL (100 mLs Intravenous Contrast Given 10/27/23 0311)  cefTRIAXone  (ROCEPHIN ) 1 g in sodium chloride  0.9 % 100 mL IVPB (0 g Intravenous Stopped 10/27/23 0503)    Medical Decision Making:   Brian Zavala is a 73 y.o. male  who presented to the ED today with abdominal pain, detailed above.    Additional history discussed with patient's family/caregivers.  Patient placed on continuous vitals and telemetry monitoring while in ED which was reviewed periodically.  Complete initial physical exam performed, notably the patient  was HDS in NAD.     Reviewed and confirmed nursing documentation for past medical history, family history, social history.    Initial Assessment:   With the patient's presentation of abdominal pain, most likely diagnosis is . Other diagnoses were considered including (but not limited to) gastroenteritis,  colitis, small bowel obstruction, appendicitis, cholecystitis, pancreatitis, nephrolithiasis, UTI, pyleonephritis. These are considered less likely due to history of present illness and physical exam findings.      Initial Plan:  CBC/CMP to evaluate for underlying infectious/metabolic etiology for patient's abdominal pain  Lipase to evaluate for pancreatitis  EKG to evaluate for cardiac source of pain  CTAB/Pelvis with contrast to evaluate for structural/surgical etiology of patients' severe abdominal pain.  Will include angiography for evaluation of potential mesenteric ischemia given his history of peripheral vascular disease Urinalysis and repeat physical assessment to evaluate for UTI/Pyelonpehritis  Empiric management of symptoms with escalating pain control and antiemetics as needed.   Initial Study Results:   Laboratory   Leukocytosis, bacteriuria    EKG EKG was reviewed independently. Rate, rhythm, axis, intervals all examined and without medically relevant abnormality. ST segments without concerns for elevations.    Radiology All images reviewed independently. Agree with radiology report at this time.   CT ANGIO ABDOMEN PELVIS  W & WO CONTRAST Result Date: 10/27/2023 CLINICAL DATA:  Lower GI bleeding EXAM: CTA ABDOMEN AND PELVIS WITHOUT AND WITH CONTRAST TECHNIQUE: Multidetector CT imaging of the abdomen and pelvis was performed using the standard protocol during bolus administration of intravenous contrast. Multiplanar reconstructed images and MIPs were obtained and reviewed to evaluate the vascular anatomy. RADIATION DOSE REDUCTION: This exam was performed according to the departmental dose-optimization program which includes automated exposure control, adjustment of the mA and/or kV according to patient size and/or use of iterative reconstruction technique. CONTRAST:  OMNIPAQUE  IOHEXOL  350 MG/ML SOLN COMPARISON:  08/18/2023 FINDINGS: VASCULAR Aorta: Abdominal aorta  demonstrates atherosclerotic calcifications. No dissection or aneurysmal dilatation is seen. Celiac: Calcification is noted at the origin without focal stenosis. SMA: Calcification is noted at the origin without focal stenosis. Renals: Calcific plaque is noted arteries without focal significant stenosis. IMA: Patent without evidence of aneurysm, dissection, vasculitis or significant stenosis. Inflow: Iliacs demonstrate heavy atherosclerotic calcifications. 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 The superficial femoral artery on the right is occluded just beyond its origin. Veins: No specific venous abnormality is noted. Review of the MIP images confirms the above findings. NON-VASCULAR Lower chest: Interstitial fibrotic changes are noted. Emphysematous changes are seen. No focal confluent infiltrate or effusion is noted. Hepatobiliary: No focal liver abnormality is seen. Status post cholecystectomy. No biliary dilatation. Pancreas: Unremarkable. No pancreatic ductal dilatation or surrounding inflammatory changes. Spleen: Normal in size without focal abnormality. Adrenals/Urinary Tract: Adrenal glands are within normal limits. Kidneys demonstrate a normal enhancement pattern. Stable right renal cyst is noted. No follow-up is recommended. No obstructive changes are seen. The bladder is partially distended. Stomach/Bowel: The appendix is within normal limits. No obstructive or inflammatory changes of the colon are seen. Stomach is within normal limits. Large duodenal diverticulum is noted. No findings to suggest GI hemorrhage are seen. Lymphatic: Calcified Peri hepatic and portacaval lymph nodes are  seen. These are stable in appearance from the prior study. Reproductive: Prostate is unremarkable. Other: No abdominal wall hernia or abnormality. No abdominopelvic ascites. Musculoskeletal: Degenerative changes of the lumbar spine are noted. IMPRESSION: VASCULAR Scattered atherosclerotic changes.  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 No evidence of active GI hemorrhage. NON-VASCULAR Chronic changes similar to that seen on the prior exam. No acute abnormality is noted. Electronically Signed   By: Oneil Devonshire M.D.   On: 10/27/2023 03:22     Final Reassessment and Plan:   Patient's history of present illness feels and findings are most consistent with pyelonephritis given degree of left flank pain. CT scan shows no other abscess collection. Treated with Rocephin , culture sent, IV fluid initiated.  Patient arranged for admission to medicine for ongoing care management.  Disposition:   Based on the above findings, I believe this patient is stable for admission.    Patient/family educated about specific findings on our evaluation and explained exact reasons for admission.  Patient/family educated about clinical situation and time was allowed to answer questions.   Admission team communicated with and agreed with need for admission. Patient admitted. Patient ready to move at this time.     Emergency Department Medication Summary:   Medications  fentaNYL  (SUBLIMAZE ) injection 50 mcg (50 mcg Intravenous Given 10/27/23 0234)  ondansetron  (ZOFRAN ) injection 4 mg (4 mg Intravenous Given 10/27/23 0235)  lactated ringers  bolus 1,000 mL (0 mLs Intravenous Stopped 10/27/23 0420)  iohexol  (OMNIPAQUE ) 350 MG/ML injection 100 mL (100 mLs Intravenous Contrast Given 10/27/23 0311)  cefTRIAXone  (ROCEPHIN ) 1 g in sodium chloride  0.9 % 100 mL IVPB (0 g Intravenous Stopped 10/27/23 0503)             Clinical Impression:  1. Pyelonephritis      Admit   Final Clinical Impression(s) / ED Diagnoses Final diagnoses:  Pyelonephritis    Rx / DC Orders ED Discharge Orders     None         Jerral Meth, MD 10/27/23 [phone]

## 2023-10-27 NOTE — Plan of Care (Signed)

## 2023-10-28 DIAGNOSIS — Z89611 Acquired absence of right leg above knee: Secondary | ICD-10-CM | POA: Diagnosis not present

## 2023-10-28 DIAGNOSIS — Z833 Family history of diabetes mellitus: Secondary | ICD-10-CM | POA: Diagnosis not present

## 2023-10-28 DIAGNOSIS — E860 Dehydration: Secondary | ICD-10-CM | POA: Diagnosis present

## 2023-10-28 DIAGNOSIS — R7881 Bacteremia: Secondary | ICD-10-CM | POA: Insufficient documentation

## 2023-10-28 DIAGNOSIS — Z7984 Long term (current) use of oral hypoglycemic drugs: Secondary | ICD-10-CM | POA: Diagnosis not present

## 2023-10-28 DIAGNOSIS — R109 Unspecified abdominal pain: Secondary | ICD-10-CM | POA: Diagnosis not present

## 2023-10-28 DIAGNOSIS — E1140 Type 2 diabetes mellitus with diabetic neuropathy, unspecified: Secondary | ICD-10-CM | POA: Diagnosis present

## 2023-10-28 DIAGNOSIS — Z794 Long term (current) use of insulin: Secondary | ICD-10-CM | POA: Diagnosis not present

## 2023-10-28 DIAGNOSIS — B961 Klebsiella pneumoniae [K. pneumoniae] as the cause of diseases classified elsewhere: Secondary | ICD-10-CM | POA: Diagnosis present

## 2023-10-28 DIAGNOSIS — J449 Chronic obstructive pulmonary disease, unspecified: Secondary | ICD-10-CM | POA: Diagnosis present

## 2023-10-28 DIAGNOSIS — Z993 Dependence on wheelchair: Secondary | ICD-10-CM | POA: Diagnosis not present

## 2023-10-28 DIAGNOSIS — A419 Sepsis, unspecified organism: Secondary | ICD-10-CM | POA: Insufficient documentation

## 2023-10-28 DIAGNOSIS — I6529 Occlusion and stenosis of unspecified carotid artery: Secondary | ICD-10-CM | POA: Diagnosis present

## 2023-10-28 DIAGNOSIS — N12 Tubulo-interstitial nephritis, not specified as acute or chronic: Secondary | ICD-10-CM | POA: Diagnosis present

## 2023-10-28 DIAGNOSIS — E871 Hypo-osmolality and hyponatremia: Secondary | ICD-10-CM | POA: Diagnosis present

## 2023-10-28 DIAGNOSIS — E1143 Type 2 diabetes mellitus with diabetic autonomic (poly)neuropathy: Secondary | ICD-10-CM | POA: Diagnosis present

## 2023-10-28 DIAGNOSIS — E1165 Type 2 diabetes mellitus with hyperglycemia: Secondary | ICD-10-CM | POA: Diagnosis present

## 2023-10-28 DIAGNOSIS — Z8673 Personal history of transient ischemic attack (TIA), and cerebral infarction without residual deficits: Secondary | ICD-10-CM | POA: Diagnosis not present

## 2023-10-28 DIAGNOSIS — G894 Chronic pain syndrome: Secondary | ICD-10-CM | POA: Diagnosis present

## 2023-10-28 DIAGNOSIS — F17210 Nicotine dependence, cigarettes, uncomplicated: Secondary | ICD-10-CM | POA: Diagnosis present

## 2023-10-28 DIAGNOSIS — I70201 Unspecified atherosclerosis of native arteries of extremities, right leg: Secondary | ICD-10-CM | POA: Diagnosis present

## 2023-10-28 DIAGNOSIS — R1011 Right upper quadrant pain: Secondary | ICD-10-CM | POA: Diagnosis present

## 2023-10-28 DIAGNOSIS — R110 Nausea: Secondary | ICD-10-CM | POA: Diagnosis not present

## 2023-10-28 DIAGNOSIS — A4159 Other Gram-negative sepsis: Secondary | ICD-10-CM | POA: Diagnosis present

## 2023-10-28 DIAGNOSIS — K3184 Gastroparesis: Secondary | ICD-10-CM | POA: Diagnosis present

## 2023-10-28 DIAGNOSIS — E785 Hyperlipidemia, unspecified: Secondary | ICD-10-CM | POA: Diagnosis present

## 2023-10-28 DIAGNOSIS — M069 Rheumatoid arthritis, unspecified: Secondary | ICD-10-CM | POA: Diagnosis present

## 2023-10-28 DIAGNOSIS — I10 Essential (primary) hypertension: Secondary | ICD-10-CM | POA: Diagnosis present

## 2023-10-28 DIAGNOSIS — Z7985 Long-term (current) use of injectable non-insulin antidiabetic drugs: Secondary | ICD-10-CM | POA: Diagnosis not present

## 2023-10-28 LAB — CBC
HCT: 41.6 % (ref 39.0–52.0)
Hemoglobin: 13.3 g/dL (ref 13.0–17.0)
MCH: 26 pg (ref 26.0–34.0)
MCHC: 32 g/dL (ref 30.0–36.0)
MCV: 81.3 fL (ref 80.0–100.0)
Platelets: 281 K/uL (ref 150–400)
RBC: 5.12 MIL/uL (ref 4.22–5.81)
RDW: 16.2 % — ABNORMAL HIGH (ref 11.5–15.5)
WBC: 9.7 K/uL (ref 4.0–10.5)
nRBC: 0 % (ref 0.0–0.2)

## 2023-10-28 LAB — BLOOD CULTURE ID PANEL (REFLEXED) - BCID2

## 2023-10-28 LAB — BASIC METABOLIC PANEL WITH GFR
Anion gap: 10 (ref 5–15)
BUN: 7 mg/dL — ABNORMAL LOW (ref 8–23)
CO2: 25 mmol/L (ref 22–32)
Calcium: 8.4 mg/dL — ABNORMAL LOW (ref 8.9–10.3)
Chloride: 98 mmol/L (ref 98–111)
Creatinine, Ser: 0.58 mg/dL — ABNORMAL LOW (ref 0.61–1.24)
GFR, Estimated: >60 mL/min (ref >60)
Glucose, Bld: 199 mg/dL — ABNORMAL HIGH (ref 70–99)
Potassium: 2.7 mmol/L — CL (ref 3.5–5.1)
Sodium: 133 mmol/L — ABNORMAL LOW (ref 135–145)

## 2023-10-28 LAB — C DIFFICILE QUICK SCREEN W PCR REFLEX
C Diff antigen: NEGATIVE
C Diff interpretation: NOT DETECTED
C Diff toxin: NEGATIVE

## 2023-10-28 LAB — GLUCOSE, CAPILLARY
Glucose-Capillary: 184 mg/dL — ABNORMAL HIGH (ref 70–99)
Glucose-Capillary: 187 mg/dL — ABNORMAL HIGH (ref 70–99)
Glucose-Capillary: 194 mg/dL — ABNORMAL HIGH (ref 70–99)
Glucose-Capillary: 195 mg/dL — ABNORMAL HIGH (ref 70–99)
Glucose-Capillary: 208 mg/dL — ABNORMAL HIGH (ref 70–99)
Glucose-Capillary: 211 mg/dL — ABNORMAL HIGH (ref 70–99)

## 2023-10-28 LAB — MAGNESIUM: Magnesium: 1.7 mg/dL (ref 1.7–2.4)

## 2023-10-28 MED ORDER — INSULIN GLARGINE 100 UNIT/ML ~~LOC~~ SOLN
40.0000 [IU] | Freq: Every day | SUBCUTANEOUS | Status: DC
  Administered 2023-10-29 – 2023-10-30 (×2): 40 [IU] via SUBCUTANEOUS
  Filled 2023-10-28 (×2): qty 0.4

## 2023-10-28 MED ORDER — POTASSIUM CHLORIDE 10 MEQ/100ML IV SOLN
10.0000 meq | INTRAVENOUS | Status: AC
  Administered 2023-10-28 (×4): 10 meq via INTRAVENOUS
  Filled 2023-10-28 (×4): qty 100

## 2023-10-28 MED ORDER — MORPHINE SULFATE ER 100 MG PO TBCR
100.0000 mg | EXTENDED_RELEASE_TABLET | Freq: Two times a day (BID) | ORAL | Status: DC
  Administered 2023-10-28 – 2023-10-30 (×4): 100 mg via ORAL
  Filled 2023-10-28 (×4): qty 1

## 2023-10-28 MED ORDER — PREDNISONE 1 MG PO TABS
2.0000 mg | ORAL_TABLET | Freq: Every day | ORAL | Status: DC
  Administered 2023-10-29 – 2023-10-30 (×2): 2 mg via ORAL
  Filled 2023-10-28 (×2): qty 2

## 2023-10-28 MED ORDER — METOPROLOL TARTRATE 5 MG/5ML IV SOLN: 2.5000 mg | INTRAVENOUS | Status: DC | PRN

## 2023-10-28 MED ORDER — SODIUM CHLORIDE 0.9 % IV SOLN
2.0000 g | INTRAVENOUS | Status: DC
  Administered 2023-10-28 – 2023-10-29 (×2): 2 g via INTRAVENOUS
  Filled 2023-10-28 (×2): qty 20

## 2023-10-28 MED ORDER — HYDROMORPHONE HCL 1 MG/ML IJ SOLN
1.0000 mg | INTRAMUSCULAR | Status: DC | PRN
  Administered 2023-10-28 – 2023-10-29 (×6): 1 mg via INTRAVENOUS
  Filled 2023-10-28 (×6): qty 1

## 2023-10-28 MED ORDER — ONDANSETRON HCL 4 MG PO TABS: 4.0000 mg | ORAL_TABLET | Freq: Four times a day (QID) | ORAL | Status: DC | PRN

## 2023-10-28 MED ORDER — OXYCODONE HCL 5 MG PO TABS
5.0000 mg | ORAL_TABLET | Freq: Three times a day (TID) | ORAL | Status: DC | PRN
  Administered 2023-10-28: 5 mg via ORAL
  Filled 2023-10-28: qty 1

## 2023-10-28 MED ORDER — ONDANSETRON HCL 4 MG/2ML IJ SOLN: 4.0000 mg | Freq: Four times a day (QID) | INTRAMUSCULAR | Status: DC | PRN

## 2023-10-28 MED ORDER — INSULIN ASPART 100 UNIT/ML IJ SOLN
4.0000 [IU] | Freq: Three times a day (TID) | INTRAMUSCULAR | Status: DC
  Administered 2023-10-28 – 2023-10-30 (×6): 4 [IU] via SUBCUTANEOUS

## 2023-10-28 MED ORDER — POTASSIUM CHLORIDE 2 MEQ/ML IV SOLN
INTRAVENOUS | Status: DC
  Filled 2023-10-28 (×3): qty 1000

## 2023-10-28 NOTE — Progress Notes (Signed)
 PHARMACY - PHYSICIAN COMMUNICATION CRITICAL VALUE ALERT - BLOOD CULTURE IDENTIFICATION (BCID)  Brian Zavala is an 73 y.o. male who presented to [facility] 10/26/2023 with a chief complaint of nausea/abdominal pain   Name of physician (or Provider) Contacted: Dr. Lenon  Current antibiotics: Ceftriaxone  1g IV q24h  Changes to prescribed antibiotics recommended:  Increase Ceftriaxone  to 2g IV q24h  Results for orders placed or performed during the hospital encounter of 10/26/23  Blood Culture ID Panel (Reflexed) (Collected: 10/27/2023  4:33 AM)  Result Value Ref Range   Enterococcus faecalis NOT DETECTED NOT DETECTED   Enterococcus Faecium NOT DETECTED NOT DETECTED   Listeria monocytogenes NOT DETECTED NOT DETECTED   Staphylococcus species NOT DETECTED NOT DETECTED   Staphylococcus aureus (BCID) NOT DETECTED NOT DETECTED   Staphylococcus epidermidis NOT DETECTED NOT DETECTED   Staphylococcus lugdunensis NOT DETECTED NOT DETECTED   Streptococcus species NOT DETECTED NOT DETECTED   Streptococcus agalactiae NOT DETECTED NOT DETECTED   Streptococcus pneumoniae NOT DETECTED NOT DETECTED   Streptococcus pyogenes NOT DETECTED NOT DETECTED   A.calcoaceticus-baumannii NOT DETECTED NOT DETECTED   Bacteroides fragilis NOT DETECTED NOT DETECTED   Enterobacterales DETECTED (A) NOT DETECTED   Enterobacter cloacae complex NOT DETECTED NOT DETECTED   Escherichia coli NOT DETECTED NOT DETECTED   Klebsiella aerogenes NOT DETECTED NOT DETECTED   Klebsiella oxytoca DETECTED (A) NOT DETECTED   Klebsiella pneumoniae NOT DETECTED NOT DETECTED   Proteus species NOT DETECTED NOT DETECTED   Salmonella species NOT DETECTED NOT DETECTED   Serratia marcescens NOT DETECTED NOT DETECTED   Haemophilus influenzae NOT DETECTED NOT DETECTED   Neisseria meningitidis NOT DETECTED NOT DETECTED   Pseudomonas aeruginosa NOT DETECTED NOT DETECTED   Stenotrophomonas maltophilia NOT DETECTED NOT DETECTED    Candida albicans NOT DETECTED NOT DETECTED   Candida auris NOT DETECTED NOT DETECTED   Candida glabrata NOT DETECTED NOT DETECTED   Candida krusei NOT DETECTED NOT DETECTED   Candida parapsilosis NOT DETECTED NOT DETECTED   Candida tropicalis NOT DETECTED NOT DETECTED   Cryptococcus neoformans/gattii NOT DETECTED NOT DETECTED   CTX-M ESBL NOT DETECTED NOT DETECTED   Carbapenem resistance IMP NOT DETECTED NOT DETECTED   Carbapenem resistance KPC NOT DETECTED NOT DETECTED   Carbapenem resistance NDM NOT DETECTED NOT DETECTED   Carbapenem resist OXA 48 LIKE NOT DETECTED NOT DETECTED   Carbapenem resistance VIM NOT DETECTED NOT DETECTED    Clair Agent 10/28/2023  3:00 AM

## 2023-10-28 NOTE — Progress Notes (Signed)
 Prevelon boot placed to left foot

## 2023-10-28 NOTE — Progress Notes (Signed)
 Transition of Care [facility]) - Inpatient Brief Assessment   Patient Details  Name: UZIEL COVAULT MRN: [id number] Date of Birth: [birth year]  Transition of Care [facility]) CM/SW Contact:    Rosaline JONELLE Joe, RN Phone Number: 10/28/2023, 10:15 AM   Clinical Narrative: Patient admitted to the hospital with severe abdominal pain.  Patient lives alone but patient states that his sister is the head RN at [facility] [place] and assists patient as needed.  The patient is normally independent and drives.  DME at the home includes [facility], electric wheelchair, RW.  Patient states that when he is medically stable - he plans to have sister assist with transportation to home by personal care.  No IP Care management needs at this time.   Transition of Care Asessment: Insurance and Status: (P) Insurance coverage has been reviewed Patient has primary care physician: (P) Yes Home environment has been reviewed: (P) from home alone Prior level of function:: (P) self Prior/Current Home Services: (P) Current home services (Patient is active with [facility] [place]) Social Drivers of Health Review: (P) SDOH reviewed no interventions necessary Readmission risk has been reviewed: (P) Yes Transition of care needs: (P) no transition of care needs at this time

## 2023-10-28 NOTE — Inpatient Diabetes Management (Signed)
 Inpatient Diabetes Program Recommendations  AACE/ADA: New Consensus Statement on Inpatient Glycemic Control (2015)  Target Ranges:  Prepandial:   less than 140 mg/dL      Peak postprandial:   less than 180 mg/dL (1-2 hours)      Critically ill patients:  140 - 180 mg/dL   Lab Results  Component Value Date   GLUCAP 211 (H) 10/28/2023   HGBA1C 7.5 (A) 10/23/2023    Review of Glycemic Control  Latest Reference Range & Units 10/27/23 21:20 10/28/23 00:32 10/28/23 04:10 10/28/23 08:04  Glucose-Capillary 70 - 99 mg/dL 748 (H) 815 (H) 805 (H) 211 (H)   Diabetes history: DM 2 Outpatient Diabetes medications:  Novolog  2-12 units 4 times a day Semglee  72 units q AM Metformin  XR 1000 mg bid Current orders for Inpatient glycemic control:  Lantus  30 units daily Novolog  0-9 units tid with meals  Inpatient Diabetes Program Recommendations:    Please consider increasing Lantus  to 40 units daily.  Also consider adding Novolog  meal coverage 4 units tid with meals (hold if patient eats less than 50% or NPO).   Thanks,  Randall Bullocks, RN, BC-ADM Inpatient Diabetes Coordinator Pager [phone]  (8a-5p)

## 2023-10-28 NOTE — Plan of Care (Signed)
  Problem: Education: Goal: Ability to describe self-care measures that may prevent or decrease complications (Diabetes Survival Skills Education) will improve Outcome: Progressing   Problem: Coping: Goal: Ability to adjust to condition or change in health will improve Outcome: Progressing   Problem: Fluid Volume: Goal: Ability to maintain a balanced intake and output will improve Outcome: Progressing   Problem: Health Behavior/Discharge Planning: Goal: Ability to manage health-related needs will improve Outcome: Progressing   Problem: Skin Integrity: Goal: Risk for impaired skin integrity will decrease Outcome: Progressing   Problem: Education: Goal: Knowledge of General Education information will improve Description: Including pain rating scale, medication(s)/side effects and non-pharmacologic comfort measures Outcome: Progressing   Problem: Clinical Measurements: Goal: Respiratory complications will improve Outcome: Progressing   Problem: Clinical Measurements: Goal: Cardiovascular complication will be avoided Outcome: Progressing   Problem: Nutrition: Goal: Adequate nutrition will be maintained Outcome: Progressing   Problem: Coping: Goal: Level of anxiety will decrease Outcome: Progressing

## 2023-10-28 NOTE — Progress Notes (Addendum)
 PROGRESS NOTE  Brian Zavala FMW:[id number] DOB: [birth year]   PCP: Cloria Annabella CROME, DO  Patient is from: Home.  Lives alone.  Uses wheelchair for mobility.  DOA: 10/26/2023 LOS: 0  Chief complaints Chief Complaint  Patient presents with   Abdominal Pain     Brief Narrative / Interim history: 73 year old M with PMH of IDDM-2, RA on the DMARD/[employer], chronic abdominal and back pain on high-dose MS Contin , COPD, CVA, carotid artery disease, right BKA and tobacco use disorder presenting with 2 days of abdominal pain and nausea without emesis, and admitted with intractable pain and nausea as well as possible UTI.  In ED, stable vitals.  WBC 11.2.  UA consistent with UTI and also glucosuria and ketonuria.  CT abdomen and pelvis without acute finding to explain patient's symptoms.  Cultures obtained.  Started on IV fluid, ceftriaxone  and Reglan  and admitted for further care.  The next day, blood culture with Klebsiella oxytoca.  Developed diarrhea.  C. difficile and GIB ordered.  Continued to have significant abdominal pain.  Subjective: Seen and examined earlier this morning.  No major events overnight or this morning.  Severe cramping abdominal pain.  Also reports diarrhea but not large-volume.  Continues to have some nausea but no emesis.  Patient's sister at bedside.  Objective: Vitals:   10/27/23 1933 10/28/23 0046 10/28/23 0400 10/28/23 0758  BP: 117/84 (!) 166/88 127/85 (!) 143/85  Pulse: (!) 117 (!) 114 (!) 109 (!) 103  Resp: 19 19 18    Temp: 99.5 F (37.5 C) 98.7 F (37.1 C) 99.3 F (37.4 C) 98 F (36.7 C)  TempSrc: Oral Oral Oral   SpO2: 95% 93% 99% 96%  Weight:      Height:        Examination:  GENERAL: Appears uncomfortable from abdominal pain. HEENT: MMM.  Vision and hearing grossly intact.  NECK: Supple.  No apparent JVD.  RESP:  No IWOB.  Fair aeration bilaterally. CVS:  RRR. Heart sounds normal.  ABD/GI/GU: BS+. Abd soft, NTND.  MSK/EXT:  Moves  extremities.  Right AKA. SKIN: Some scabbed wounds on his lower extremities.  No signs of infection. NEURO: AA.  Oriented appropriately.  No apparent focal neuro deficit. PSYCH: Calm. Normal affect.   Consultants:  None  Procedures: None  Microbiology summarized: 8/25-blood culture with Klebsiella oxytoca in 1 out of 2 bottles. 8/25-urine culture with GNR. 8/26-C. difficile and GIP pending   Assessment and plan: Intractable abdominal pain/nausea: Patient has chronic abdominal pain.  Possible etiologies include gastroparesis, infection.  Doubt ischemia with normal lactic acidosis.  No acute findings on CT abdomen and pelvis either.  He has diffuse tenderness.  Did not improve with Reglan  -Check C. difficile and PCR given diarrhea - Resume home MS Contin  100 mg twice daily - IV Dilaudid  for breakthrough pain -Continue Protonix  - Discontinue Reglan  and resume Zofran  - Continue IV fluid and soft diet.  We might back off on diet if pain does not improve with pain medications - Continue antibiotics for bacteremia and UTI  Sepsis due to bacteremia and urinary tract infection: Present on arrival.  Had HR of 130 and leukocytosis on arrival.  Blood culture with Klebsiella oxytoca.  Urine culture with GNR. - Continue IV ceftriaxone  2 g daily - Follow culture sensitivity  Poorly controlled IDDM-2 with hyperglycemia: A1c 7.5%. Recent Labs  Lab 10/27/23 2101 10/27/23 2120 10/28/23 0032 10/28/23 0410 10/28/23 0804  GLUCAP 283* 251* 184* 194* 211*  - Increase Semglee  from 30  to 40 mg daily - Continue SSI-sensitive - Added NovoLog  4 units 3 times daily with meals - Further adjustment as appropriate  Chronic abdominal and back pain - MS Contin  and IV Dilaudid  as above - Continue home Lyrica   Rheumatoid arthritis: On prednisone , Arava  and Enbrel . - Continue home prednisone   History of CVA/PAD/carotid artery stenosis - Resume home aspirin , Plavix  and Lipitor   Essential  hypertension: Normotensive for most part - Hold HCTZ - Continue home metoprolol   Right AKA/wheelchair dependence - PT/OT eval  Hyponatremia: Mild - Continue monitoring  Tobacco use disorder: Reports smoking about a pack a day - Encouraged smoking cessation - Nicotine  patch    Body mass index is 22.03 kg/m.          DVT prophylaxis:  enoxaparin  (LOVENOX ) injection 40 mg Start: 10/27/23 0915  Code Status: Full code Family Communication: Updated patient's sister at bedside Level of care: Telemetry Medical Status is: Observation The patient will require care spanning > 2 midnights and should be moved to inpatient because: Intractable abdominal pain and nausea, bacteremia, UTI and diarrhea   Final disposition: Home   55 minutes with more than 50% spent in reviewing records, counseling patient/family and coordinating care.   Sch Meds:  Scheduled Meds:  aspirin   81 mg Oral Daily   clopidogrel   75 mg Oral Daily   enoxaparin  (LOVENOX ) injection  40 mg Subcutaneous Q24H   insulin  aspart  0-9 Units Subcutaneous Q4H   insulin  aspart  4 Units Subcutaneous TID WC   [START ON 10/29/2023] insulin  glargine  40 Units Subcutaneous Daily   metoprolol  succinate  25 mg Oral QHS   morphine   100 mg Oral Q12H   nicotine   21 mg Transdermal Daily   oxymetazoline   1 spray Each Nare BID   pantoprazole   40 mg Oral QAC breakfast   [START ON 10/29/2023] predniSONE   2 mg Oral Q breakfast   pregabalin   150 mg Oral BID   sodium chloride  flush  3 mL Intravenous Q12H   Continuous Infusions:  cefTRIAXone  (ROCEPHIN )  IV 2 g (10/28/23 1015)   PRN Meds:.acetaminophen  **OR** acetaminophen , HYDROmorphone  (DILAUDID ) injection, ipratropium-albuterol , ondansetron  **OR** ondansetron  (ZOFRAN ) IV, oxyCODONE   Antimicrobials: Anti-infectives (From admission, onward)    Start     Dose/Rate Route Frequency Ordered Stop   10/28/23 1000  cefTRIAXone  (ROCEPHIN ) 2 g in sodium chloride  0.9 % 100 mL IVPB         2 g 200 mL/hr over 30 Minutes Intravenous Every 24 hours 10/28/23 0259     10/28/23 0000  cefTRIAXone  (ROCEPHIN ) 1 g in sodium chloride  0.9 % 100 mL IVPB  Status:  Discontinued        1 g 200 mL/hr over 30 Minutes Intravenous Every 24 hours 10/27/23 0637 10/28/23 0259   10/27/23 0430  cefTRIAXone  (ROCEPHIN ) 1 g in sodium chloride  0.9 % 100 mL IVPB        1 g 200 mL/hr over 30 Minutes Intravenous  Once 10/27/23 0416 10/27/23 0503        I have personally reviewed the following labs and images: CBC: Recent Labs  Lab 10/26/23 2311 10/28/23 0348  WBC 11.2* 9.7  HGB 14.9 13.3  HCT 47.7 41.6  MCV 84.7 81.3  PLT 314 281   BMP &GFR Recent Labs  Lab 10/26/23 2311  NA 134*  K 4.2  CL 95*  CO2 26  GLUCOSE 264*  BUN 8  CREATININE 0.89  CALCIUM  9.0   Estimated Creatinine Clearance: 80.4 mL/min (by C-G  formula based on SCr of 0.89 mg/dL). Liver & Pancreas: Recent Labs  Lab 10/26/23 2311  AST 20  ALT 18  ALKPHOS 83  BILITOT 1.0  PROT 7.6  ALBUMIN 3.0*   Recent Labs  Lab 10/26/23 2311  LIPASE 23   No results for input(s): AMMONIA in the last 168 hours. Diabetic: No results for input(s): HGBA1C in the last 72 hours. Recent Labs  Lab 10/27/23 2101 10/27/23 2120 10/28/23 0032 10/28/23 0410 10/28/23 0804  GLUCAP 283* 251* 184* 194* 211*   Cardiac Enzymes: No results for input(s): CKTOTAL, CKMB, CKMBINDEX, TROPONINI in the last 168 hours. No results for input(s): PROBNP in the last 8760 hours. Coagulation Profile: No results for input(s): INR, PROTIME in the last 168 hours. Thyroid Function Tests: No results for input(s): TSH, T4TOTAL, FREET4, T3FREE, THYROIDAB in the last 72 hours. Lipid Profile: No results for input(s): CHOL, HDL, LDLCALC, TRIG, CHOLHDL, LDLDIRECT in the last 72 hours. Anemia Panel: No results for input(s): VITAMINB12, FOLATE, FERRITIN, TIBC, IRON, RETICCTPCT in the last 72 hours. Urine  analysis:    Component Value Date/Time   COLORURINE YELLOW 10/27/2023 0238   APPEARANCEUR HAZY (A) 10/27/2023 0238   LABSPEC 1.023 10/27/2023 0238   PHURINE 5.0 10/27/2023 0238   GLUCOSEU >=500 (A) 10/27/2023 0238   HGBUR SMALL (A) 10/27/2023 0238   BILIRUBINUR NEGATIVE 10/27/2023 0238   KETONESUR 80 (A) 10/27/2023 0238   PROTEINUR 30 (A) 10/27/2023 0238   NITRITE POSITIVE (A) 10/27/2023 0238   LEUKOCYTESUR LARGE (A) 10/27/2023 0238   Sepsis Labs: Invalid input(s): PROCALCITONIN, LACTICIDVEN  Microbiology: Recent Results (from the past 240 hours)  Urine Culture     Status: Abnormal (Preliminary result)   Collection Time: 10/27/23  4:16 AM   Specimen: Urine, Clean Catch  Result Value Ref Range Status   Specimen Description URINE, CLEAN CATCH  Final   Special Requests NONE  Final   Culture (A)  Final    >=100,000 COLONIES/mL GRAM NEGATIVE RODS SUSCEPTIBILITIES TO FOLLOW Performed at [facility], [address] [place], [place] [address]    Report Status PENDING  Incomplete  Blood culture (routine x 2)     Status: Abnormal (Preliminary result)   Collection Time: 10/27/23  4:33 AM   Specimen: BLOOD  Result Value Ref Range Status   Specimen Description BLOOD LEFT ANTECUBITAL  Final   Special Requests   Final    BOTTLES DRAWN AEROBIC AND ANAEROBIC Blood Culture results may not be optimal due to an inadequate volume of blood received in culture bottles   Culture  Setup Time   Final    GRAM NEGATIVE RODS AEROBIC BOTTLE ONLY CRITICAL RESULT CALLED TO, READ BACK BY AND VERIFIED WITH: PHARMD J LEDFORD 10/28/2023 @ 0222 BY AB    Culture (A)  Final    KLEBSIELLA OXYTOCA TOO YOUNG TO READ Performed at [facility], [address], [place], [place] [address]    Report Status PENDING  Incomplete  Blood culture (routine x 2)     Status: None (Preliminary result)   Collection Time: 10/27/23  4:33 AM   Specimen: BLOOD  Result Value Ref Range Status   Specimen  Description BLOOD BLOOD RIGHT ARM  Final   Special Requests   Final    BOTTLES DRAWN AEROBIC AND ANAEROBIC Blood Culture adequate volume   Culture   Final    NO GROWTH 1 DAY Performed at [facility], [address], [place], [place] [address]  Report Status PENDING  Incomplete  Blood Culture ID Panel (Reflexed)     Status: Abnormal   Collection Time: 10/27/23  4:33 AM  Result Value Ref Range Status   Enterococcus faecalis NOT DETECTED NOT DETECTED Final   Enterococcus Faecium NOT DETECTED NOT DETECTED Final   Listeria monocytogenes NOT DETECTED NOT DETECTED Final   Staphylococcus species NOT DETECTED NOT DETECTED Final   Staphylococcus aureus (BCID) NOT DETECTED NOT DETECTED Final   Staphylococcus epidermidis NOT DETECTED NOT DETECTED Final   Staphylococcus lugdunensis NOT DETECTED NOT DETECTED Final   Streptococcus species NOT DETECTED NOT DETECTED Final   Streptococcus agalactiae NOT DETECTED NOT DETECTED Final   Streptococcus pneumoniae NOT DETECTED NOT DETECTED Final   Streptococcus pyogenes NOT DETECTED NOT DETECTED Final   A.calcoaceticus-baumannii NOT DETECTED NOT DETECTED Final   Bacteroides fragilis NOT DETECTED NOT DETECTED Final   Enterobacterales DETECTED (A) NOT DETECTED Final    Comment: Enterobacterales represent a large order of gram negative bacteria, not a single organism. CRITICAL RESULT CALLED TO, READ BACK BY AND VERIFIED WITH: PHARMD J LEDFORD 10/28/2023 @ 0222 BY AB    Enterobacter cloacae complex NOT DETECTED NOT DETECTED Final   Escherichia coli NOT DETECTED NOT DETECTED Final   Klebsiella aerogenes NOT DETECTED NOT DETECTED Final   Klebsiella oxytoca DETECTED (A) NOT DETECTED Final    Comment: CRITICAL RESULT CALLED TO, READ BACK BY AND VERIFIED WITH: PHARMD J LEDFORD 10/28/2023 @ 0222 BY AB    Klebsiella pneumoniae NOT DETECTED NOT DETECTED Final   Proteus species NOT DETECTED NOT DETECTED Final   Salmonella species NOT DETECTED NOT DETECTED  Final   Serratia marcescens NOT DETECTED NOT DETECTED Final   Haemophilus influenzae NOT DETECTED NOT DETECTED Final   Neisseria meningitidis NOT DETECTED NOT DETECTED Final   Pseudomonas aeruginosa NOT DETECTED NOT DETECTED Final   Stenotrophomonas maltophilia NOT DETECTED NOT DETECTED Final   Candida albicans NOT DETECTED NOT DETECTED Final   Candida auris NOT DETECTED NOT DETECTED Final   Candida glabrata NOT DETECTED NOT DETECTED Final   Candida krusei NOT DETECTED NOT DETECTED Final   Candida parapsilosis NOT DETECTED NOT DETECTED Final   Candida tropicalis NOT DETECTED NOT DETECTED Final   Cryptococcus neoformans/gattii NOT DETECTED NOT DETECTED Final   CTX-M ESBL NOT DETECTED NOT DETECTED Final   Carbapenem resistance IMP NOT DETECTED NOT DETECTED Final   Carbapenem resistance KPC NOT DETECTED NOT DETECTED Final   Carbapenem resistance NDM NOT DETECTED NOT DETECTED Final   Carbapenem resist OXA 48 LIKE NOT DETECTED NOT DETECTED Final   Carbapenem resistance VIM NOT DETECTED NOT DETECTED Final    Comment: Performed at [facility], [address], [place], [place] [address]    Radiology Studies: No results found.    Marcella Charlson T. Jeslin Bazinet [facility]  If 7PM-7AM, please contact night-coverage [email or web address] 10/28/2023, 11:09 AM

## 2023-10-28 NOTE — Consult Note (Signed)
 WOC Nurse Consult Note: Reason for Consult:Moisture associated skin damage (MASD) to upper gluteal area, bilateral folds near sacrum Vascular wounds to left lower leg below knee and left foot on dorsal great toe and second toe.  History amputation left fourth toe.  Right BKA.  Wound type: vascular and moisture Pressure Injury POA: NA Measurement: gluteal:  3 cm x 1 cm x 0.1 cm on each gluteal fold.  Left great toe with mycotic, thick yellow nail and 0.2 cm nonintact lesion to great toe.  Left second toe with 0.3 cm scabbed lesion.  LEft lower leg:  4 areas 1 cm round scabbed areas.  Wound azi:Drjaapwh Drainage (amount, consistency, odor) none noted Periwound: intact, legs hairless, history vascular impairment Dressing procedure/placement/frequency: Cleanse wounds to sacrum/buttocks with soap and water and apply sacral foam.  Change every three days.  Paint wounds to left toes and lower leg with betadine  daily.  Open to air.  IF begins draining, can cover with dry dressing/foam,  Will not follow at this time.  Please re-consult if needed.  Darice Cooley MSN, RN, FNP-BC CWON Wound, Ostomy, Continence Nurse [facility] [phone] Work cell phone:  [phone]

## 2023-10-28 NOTE — Plan of Care (Signed)

## 2023-10-29 DIAGNOSIS — R109 Unspecified abdominal pain: Secondary | ICD-10-CM | POA: Diagnosis not present

## 2023-10-29 LAB — MAGNESIUM: Magnesium: 1.7 mg/dL (ref 1.7–2.4)

## 2023-10-29 LAB — COMPREHENSIVE METABOLIC PANEL WITH GFR
ALT: 16 U/L (ref 0–44)
AST: 20 U/L (ref 15–41)
Albumin: 2.3 g/dL — ABNORMAL LOW (ref 3.5–5.0)
Alkaline Phosphatase: 64 U/L (ref 38–126)
Anion gap: 9 (ref 5–15)
BUN: 5 mg/dL — ABNORMAL LOW (ref 8–23)
CO2: 26 mmol/L (ref 22–32)
Calcium: 8.2 mg/dL — ABNORMAL LOW (ref 8.9–10.3)
Chloride: 102 mmol/L (ref 98–111)
Creatinine, Ser: 0.51 mg/dL — ABNORMAL LOW (ref 0.61–1.24)
GFR, Estimated: >60 mL/min (ref >60)
Glucose, Bld: 143 mg/dL — ABNORMAL HIGH (ref 70–99)
Potassium: 3.1 mmol/L — ABNORMAL LOW (ref 3.5–5.1)
Sodium: 137 mmol/L (ref 135–145)
Total Bilirubin: 0.6 mg/dL (ref 0.0–1.2)
Total Protein: 6 g/dL — ABNORMAL LOW (ref 6.5–8.1)

## 2023-10-29 LAB — URINE CULTURE: Culture: >=100000 — AB

## 2023-10-29 LAB — GASTROINTESTINAL PANEL BY PCR, STOOL (REPLACES STOOL CULTURE)

## 2023-10-29 LAB — GLUCOSE, CAPILLARY
Glucose-Capillary: 128 mg/dL — ABNORMAL HIGH (ref 70–99)
Glucose-Capillary: 182 mg/dL — ABNORMAL HIGH (ref 70–99)
Glucose-Capillary: 183 mg/dL — ABNORMAL HIGH (ref 70–99)
Glucose-Capillary: 257 mg/dL — ABNORMAL HIGH (ref 70–99)
Glucose-Capillary: 264 mg/dL — ABNORMAL HIGH (ref 70–99)
Glucose-Capillary: 267 mg/dL — ABNORMAL HIGH (ref 70–99)

## 2023-10-29 LAB — CBC
HCT: 40.3 % (ref 39.0–52.0)
Hemoglobin: 13.1 g/dL (ref 13.0–17.0)
MCH: 26.4 pg (ref 26.0–34.0)
MCHC: 32.5 g/dL (ref 30.0–36.0)
MCV: 81.1 fL (ref 80.0–100.0)
Platelets: 264 K/uL (ref 150–400)
RBC: 4.97 MIL/uL (ref 4.22–5.81)
RDW: 16.1 % — ABNORMAL HIGH (ref 11.5–15.5)
WBC: 6.8 K/uL (ref 4.0–10.5)
nRBC: 0 % (ref 0.0–0.2)

## 2023-10-29 LAB — C-REACTIVE PROTEIN: CRP: 4.7 mg/dL — ABNORMAL HIGH (ref <1.0)

## 2023-10-29 LAB — SEDIMENTATION RATE: Sed Rate: 61 mm/h — ABNORMAL HIGH (ref 0–16)

## 2023-10-29 MED ORDER — POTASSIUM CHLORIDE 20 MEQ PO PACK
40.0000 meq | PACK | Freq: Once | ORAL | Status: AC
  Administered 2023-10-29: 40 meq via ORAL
  Filled 2023-10-29: qty 2

## 2023-10-29 NOTE — Progress Notes (Signed)
 PROGRESS NOTE    Brian Zavala  FMW:[id number] DOB: 08/05/1950 DOA: 10/26/2023 PCP: Cloria Annabella CROME, DO   Brief Narrative:   73 year old M with PMH of IDDM-2, RA on the DMARD/Biologics, chronic abdominal and back pain on high-dose MS Contin , COPD, CVA, carotid artery disease, right AKA and tobacco use disorder presenting with 2 days of abdominal pain and nausea without emesis, and admitted with intractable pain and nausea as well as Klebsiella UTI and bacteremia leading to sepsis.   Assessment & Plan:  Principal Problem:   Intractable abdominal pain Active Problems:   Intractable nausea   UTI (urinary tract infection)   Essential hypertension   Type 2 diabetes mellitus with diabetic neuropathy, with long-term current use of insulin  (HCC)   Chronic pain   Peripheral vascular disease (HCC)   Rheumatoid arthritis (HCC)   Tobacco abuse   Carotid artery disease (HCC)   CVA (cerebral vascular accident) (HCC)   Gram-negative bacteremia   Sepsis (HCC)   Intractable abdominal pain/nausea: Resolved now.  -Negative C. difficile and GI panel by PCR - Resumed home MS Contin  100 mg twice daily - IV Dilaudid  for breakthrough pain -Continue Protonix  -Tolerating soft diet -Dced IVF on 8/27.   Sepsis due to Klebsiella bacteremia and urinary tract infection,POA: Improving - Continue IV ceftriaxone  2 g daily - Follow culture sensitivity   Poorly controlled IDDM-2 with hyperglycemia: A1c 7.5%. - Continue SSI-sensitive - Continue NovoLog  4 units 3 times daily with meals - Continue semglee  40 units daily.   Chronic abdominal and back pain - MS Contin  and IV Dilaudid  as above - Continue home Lyrica    Rheumatoid arthritis: On prednisone , Arava  and Enbrel . - Continue home prednisone    History of CVA/PAD/carotid artery stenosis - Resumed home aspirin , Plavix  and Lipitor    Essential hypertension: - Hold HCTZ - Continue home metoprolol    Right AKA/wheelchair dependence - PT/OT eval    Hyponatremia: Mild - Continue monitoring   Tobacco use disorder: Reports smoking about a pack a day - Encouraged smoking cessation - Nicotine  patch   DVT prophylaxis: enoxaparin  (LOVENOX ) injection 40 mg Start: 10/27/23 0915     Code Status: Full Code Family Communication:  None at the bedside Status is: Inpatient Remains inpatient appropriate because: bacteremia, UTI    Subjective:  He feels better this morning.  He denies any episodes of nausea and vomiting.  He said that he had 1 episode of diarrhea last night.  He is able to tolerate soft diet.  We spoke about his diagnosis of urinary tract infection as well as bacteremia and need for intravenous antibiotics, pending susceptibilities.  He told me that he lives at home by himself and has 2 Acupuncturist.  He also told me that he has a sister who is a head nurse at [employer]  Examination:  General exam: Appears calm and comfortable  Respiratory system: Clear to auscultation. Respiratory effort normal. Cardiovascular system: S1 & S2 heard, RRR. No JVD, murmurs, rubs, gallops or clicks. No pedal edema. Gastrointestinal system: Abdomen is nondistended, soft and nontender. No organomegaly or masses felt. Normal bowel sounds heard. Central nervous system: Alert and oriented. No focal neurological deficits. Extremities:s/p RT AKA Skin: No rashes, lesions or ulcers Psychiatry: Judgement and insight appear normal. Mood & affect appropriate.       Diet Orders (From admission, onward)     Start     Ordered   10/27/23 1651  DIET SOFT Room service appropriate? Yes; Fluid consistency: Thin  Diet effective now  Question Answer Comment  Room service appropriate? Yes   Fluid consistency: Thin      10/27/23 1650            Objective: Vitals:   10/28/23 2011 10/29/23 0008 10/29/23 0401 10/29/23 0817  BP: 118/85 (!) 154/93 (!) 154/70 129/68  Pulse: (!) 102 89 89 (!) 108  Resp:  16 18 18   Temp: (!) 97.4 F (36.3 C)  98.9 F (37.2 C) 98.6 F (37 C) 98.3 F (36.8 C)  TempSrc: Oral     SpO2: 91% 96% 92% 94%  Weight:      Height:        Intake/Output Summary (Last 24 hours) at 10/29/2023 0947 Last data filed at 10/29/2023 0800 Gross per 24 hour  Intake 2335.22 ml  Output 1550 ml  Net 785.22 ml   Filed Weights   10/26/23 2300  Weight: 75.8 kg    Scheduled Meds:  aspirin   81 mg Oral Daily   clopidogrel   75 mg Oral Daily   enoxaparin  (LOVENOX ) injection  40 mg Subcutaneous Q24H   insulin  aspart  0-9 Units Subcutaneous Q4H   insulin  aspart  4 Units Subcutaneous TID WC   insulin  glargine  40 Units Subcutaneous Daily   metoprolol  succinate  25 mg Oral QHS   morphine   100 mg Oral Q12H   nicotine   21 mg Transdermal Daily   oxymetazoline   1 spray Each Nare BID   pantoprazole   40 mg Oral QAC breakfast   predniSONE   2 mg Oral Q breakfast   pregabalin   150 mg Oral BID   sodium chloride  flush  3 mL Intravenous Q12H   Continuous Infusions:  cefTRIAXone  (ROCEPHIN )  IV Stopped (10/28/23 1050)   lactated ringers  1,000 mL with potassium chloride  30 mEq infusion 100 mL/hr at 10/29/23 9347    Nutritional status     Body mass index is 22.03 kg/m.  Data Reviewed:   CBC: Recent Labs  Lab 10/26/23 2311 10/28/23 0348 10/29/23 0301  WBC 11.2* 9.7 6.8  HGB 14.9 13.3 13.1  HCT 47.7 41.6 40.3  MCV 84.7 81.3 81.1  PLT 314 281 264   Basic Metabolic Panel: Recent Labs  Lab 10/26/23 2311 10/28/23 1012 10/29/23 0301  NA 134* 133* 137  K 4.2 2.7* 3.1*  CL 95* 98 102  CO2 26 25 26   GLUCOSE 264* 199* 143*  BUN 8 7* 5*  CREATININE 0.89 0.58* 0.51*  CALCIUM  9.0 8.4* 8.2*  MG  --  1.7 1.7   GFR: Estimated Creatinine Clearance: 89.5 mL/min (A) (by C-G formula based on SCr of 0.51 mg/dL (L)). Liver Function Tests: Recent Labs  Lab 10/26/23 2311 10/29/23 0301  AST 20 20  ALT 18 16  ALKPHOS 83 64  BILITOT 1.0 0.6  PROT 7.6 6.0*  ALBUMIN 3.0* 2.3*   Recent Labs  Lab 10/26/23 2311   LIPASE 23   No results for input(s): AMMONIA in the last 168 hours. Coagulation Profile: No results for input(s): INR, PROTIME in the last 168 hours. Cardiac Enzymes: No results for input(s): CKTOTAL, CKMB, CKMBINDEX, TROPONINI in the last 168 hours. BNP (last 3 results) No results for input(s): PROBNP in the last 8760 hours. HbA1C: No results for input(s): HGBA1C in the last 72 hours. CBG: Recent Labs  Lab 10/28/23 1625 10/28/23 2103 10/29/23 0007 10/29/23 0400 10/29/23 0817  GLUCAP 208* 195* 183* 128* 267*   Lipid Profile: No results for input(s): CHOL, HDL, LDLCALC, TRIG, CHOLHDL, LDLDIRECT in the last 72  hours. Thyroid Function Tests: No results for input(s): TSH, T4TOTAL, FREET4, T3FREE, THYROIDAB in the last 72 hours. Anemia Panel: No results for input(s): VITAMINB12, FOLATE, FERRITIN, TIBC, IRON, RETICCTPCT in the last 72 hours. Sepsis Labs: Recent Labs  Lab 10/27/23 0238  LATICACIDVEN 1.4    Recent Results (from the past 240 hours)  Urine Culture     Status: Abnormal   Collection Time: 10/27/23  4:16 AM   Specimen: Urine, Clean Catch  Result Value Ref Range Status   Specimen Description URINE, CLEAN CATCH  Final   Special Requests   Final    NONE Performed at [facility], [address], [place], [place] [address]    Culture >=100,000 COLONIES/mL KLEBSIELLA OXYTOCA (A)  Final   Report Status 10/29/2023 FINAL  Final   Organism ID, Bacteria KLEBSIELLA OXYTOCA (A)  Final      Susceptibility   Klebsiella oxytoca - MIC*    AMPICILLIN  >=32 RESISTANT Resistant     CEFEPIME <=0.12 SENSITIVE Sensitive     ERTAPENEM <=0.12 SENSITIVE Sensitive     CEFTRIAXONE  <=0.25 SENSITIVE Sensitive     CIPROFLOXACIN  <=0.06 SENSITIVE Sensitive     GENTAMICIN <=1 SENSITIVE Sensitive     NITROFURANTOIN 32 SENSITIVE Sensitive     TRIMETH /SULFA  <=20 SENSITIVE Sensitive     AMPICILLIN /SULBACTAM 4 SENSITIVE Sensitive      PIP/TAZO Value in next row Sensitive ug/mL     <=4 SENSITIVEThis is a modified FDA-approved test that has been validated and its performance characteristics determined by the reporting laboratory.  This laboratory is certified under the Clinical Laboratory Improvement Amendments CLIA as qualified to perform high complexity clinical laboratory testing.    MEROPENEM Value in next row Sensitive      <=4 SENSITIVEThis is a modified FDA-approved test that has been validated and its performance characteristics determined by the reporting laboratory.  This laboratory is certified under the Clinical Laboratory Improvement Amendments CLIA as qualified to perform high complexity clinical laboratory testing.    * >=100,000 COLONIES/mL KLEBSIELLA OXYTOCA  Blood culture (routine x 2)     Status: Abnormal (Preliminary result)   Collection Time: 10/27/23  4:33 AM   Specimen: BLOOD  Result Value Ref Range Status   Specimen Description BLOOD LEFT ANTECUBITAL  Final   Special Requests   Final    BOTTLES DRAWN AEROBIC AND ANAEROBIC Blood Culture results may not be optimal due to an inadequate volume of blood received in culture bottles   Culture  Setup Time   Final    GRAM NEGATIVE RODS AEROBIC BOTTLE ONLY CRITICAL RESULT CALLED TO, READ BACK BY AND VERIFIED WITH: PHARMD J LEDFORD 10/28/2023 @ 0222 BY AB    Culture (A)  Final    KLEBSIELLA OXYTOCA SUSCEPTIBILITIES TO FOLLOW Performed at [facility], [address], [place], [place] [address]    Report Status PENDING  Incomplete  Blood culture (routine x 2)     Status: None (Preliminary result)   Collection Time: 10/27/23  4:33 AM   Specimen: BLOOD  Result Value Ref Range Status   Specimen Description BLOOD BLOOD RIGHT ARM  Final   Special Requests   Final    BOTTLES DRAWN AEROBIC AND ANAEROBIC Blood Culture adequate volume   Culture   Final    NO GROWTH 2 DAYS Performed at [facility], [address] [place], [place] [address]    Report  Status PENDING  Incomplete  Blood Culture ID Panel (Reflexed)  Status: Abnormal   Collection Time: 10/27/23  4:33 AM  Result Value Ref Range Status   Enterococcus faecalis NOT DETECTED NOT DETECTED Final   Enterococcus Faecium NOT DETECTED NOT DETECTED Final   Listeria monocytogenes NOT DETECTED NOT DETECTED Final   Staphylococcus species NOT DETECTED NOT DETECTED Final   Staphylococcus aureus (BCID) NOT DETECTED NOT DETECTED Final   Staphylococcus epidermidis NOT DETECTED NOT DETECTED Final   Staphylococcus lugdunensis NOT DETECTED NOT DETECTED Final   Streptococcus species NOT DETECTED NOT DETECTED Final   Streptococcus agalactiae NOT DETECTED NOT DETECTED Final   Streptococcus pneumoniae NOT DETECTED NOT DETECTED Final   Streptococcus pyogenes NOT DETECTED NOT DETECTED Final   A.calcoaceticus-baumannii NOT DETECTED NOT DETECTED Final   Bacteroides fragilis NOT DETECTED NOT DETECTED Final   Enterobacterales DETECTED (A) NOT DETECTED Final    Comment: Enterobacterales represent a large order of gram negative bacteria, not a single organism. CRITICAL RESULT CALLED TO, READ BACK BY AND VERIFIED WITH: PHARMD J LEDFORD 10/28/2023 @ 0222 BY AB    Enterobacter cloacae complex NOT DETECTED NOT DETECTED Final   Escherichia coli NOT DETECTED NOT DETECTED Final   Klebsiella aerogenes NOT DETECTED NOT DETECTED Final   Klebsiella oxytoca DETECTED (A) NOT DETECTED Final    Comment: CRITICAL RESULT CALLED TO, READ BACK BY AND VERIFIED WITH: PHARMD J LEDFORD 10/28/2023 @ 0222 BY AB    Klebsiella pneumoniae NOT DETECTED NOT DETECTED Final   Proteus species NOT DETECTED NOT DETECTED Final   Salmonella species NOT DETECTED NOT DETECTED Final   Serratia marcescens NOT DETECTED NOT DETECTED Final   Haemophilus influenzae NOT DETECTED NOT DETECTED Final   Neisseria meningitidis NOT DETECTED NOT DETECTED Final   Pseudomonas aeruginosa NOT DETECTED NOT DETECTED Final   Stenotrophomonas maltophilia NOT  DETECTED NOT DETECTED Final   Candida albicans NOT DETECTED NOT DETECTED Final   Candida auris NOT DETECTED NOT DETECTED Final   Candida glabrata NOT DETECTED NOT DETECTED Final   Candida krusei NOT DETECTED NOT DETECTED Final   Candida parapsilosis NOT DETECTED NOT DETECTED Final   Candida tropicalis NOT DETECTED NOT DETECTED Final   Cryptococcus neoformans/gattii NOT DETECTED NOT DETECTED Final   CTX-M ESBL NOT DETECTED NOT DETECTED Final   Carbapenem resistance IMP NOT DETECTED NOT DETECTED Final   Carbapenem resistance KPC NOT DETECTED NOT DETECTED Final   Carbapenem resistance NDM NOT DETECTED NOT DETECTED Final   Carbapenem resist OXA 48 LIKE NOT DETECTED NOT DETECTED Final   Carbapenem resistance VIM NOT DETECTED NOT DETECTED Final    Comment: Performed at [facility], [address], [place], [place] [address]  C Difficile Quick Screen w PCR reflex     Status: None   Collection Time: 10/28/23  7:47 AM   Specimen: STOOL  Result Value Ref Range Status   C Diff antigen NEGATIVE NEGATIVE Final   C Diff toxin NEGATIVE NEGATIVE Final   C Diff interpretation No C. difficile detected.  Final    Comment: Performed at [facility], [address], [place], [place] [address]  Gastrointestinal Panel by PCR , Stool     Status: None   Collection Time: 10/28/23  7:48 AM   Specimen: Stool  Result Value Ref Range Status   Campylobacter species NOT DETECTED NOT DETECTED Final   Plesimonas shigelloides NOT DETECTED NOT DETECTED Final   Salmonella species NOT DETECTED NOT DETECTED Final   Yersinia enterocolitica NOT DETECTED NOT DETECTED Final   Vibrio species NOT DETECTED  NOT DETECTED Final   Vibrio cholerae NOT DETECTED NOT DETECTED Final   Enteroaggregative E coli (EAEC) NOT DETECTED NOT DETECTED Final   Enteropathogenic E coli (EPEC) NOT DETECTED NOT DETECTED Final   Enterotoxigenic E coli (ETEC) NOT DETECTED NOT DETECTED Final   Shiga like toxin producing E coli (STEC) NOT  DETECTED NOT DETECTED Final   Shigella/Enteroinvasive E coli (EIEC) NOT DETECTED NOT DETECTED Final   Cryptosporidium NOT DETECTED NOT DETECTED Final   Cyclospora cayetanensis NOT DETECTED NOT DETECTED Final   Entamoeba histolytica NOT DETECTED NOT DETECTED Final   Giardia lamblia NOT DETECTED NOT DETECTED Final   Adenovirus F40/41 NOT DETECTED NOT DETECTED Final   Astrovirus NOT DETECTED NOT DETECTED Final   Norovirus GI/GII NOT DETECTED NOT DETECTED Final   Rotavirus A NOT DETECTED NOT DETECTED Final   Sapovirus (I, II, IV, and V) NOT DETECTED NOT DETECTED Final    Comment: Performed at [facility], [address], [place], [place] [address]         Radiology Studies: No results found.      LOS: 1 day   Time spent= 43 mins    Deliliah Room, MD [facility]  If 7PM-7AM, please contact night-coverage  10/29/2023, 9:47 AM

## 2023-10-29 NOTE — Progress Notes (Signed)
 PT Cancellation Note  Patient Details Name: Brian Zavala MRN: [id number] DOB: Jul 26, 1950   Cancelled Treatment:    Reason Eval/Treat Not Completed: PT screened, no needs identified, will sign off; OT reports pt close to baseline and pt reports no PT needs and has needed equipment.  Will sign off.    Brian Zavala 10/29/2023, 9:59 AM [name], PT Acute Rehabilitation Services Office:[phone] 10/29/2023

## 2023-10-29 NOTE — Plan of Care (Signed)
  Problem: Coping: Goal: Ability to adjust to condition or change in health will improve Outcome: Progressing   Problem: Fluid Volume: Goal: Ability to maintain a balanced intake and output will improve Outcome: Progressing   Problem: Health Behavior/Discharge Planning: Goal: Ability to identify and utilize available resources and services will improve Outcome: Progressing   Problem: Nutritional: Goal: Maintenance of adequate nutrition will improve Outcome: Progressing   Problem: Skin Integrity: Goal: Risk for impaired skin integrity will decrease Outcome: Progressing   Problem: Tissue Perfusion: Goal: Adequacy of tissue perfusion will improve Outcome: Progressing   Problem: Education: Goal: Knowledge of General Education information will improve Description: Including pain rating scale, medication(s)/side effects and non-pharmacologic comfort measures Outcome: Progressing   Problem: Health Behavior/Discharge Planning: Goal: Ability to manage health-related needs will improve Outcome: Progressing   Problem: Clinical Measurements: Goal: Respiratory complications will improve Outcome: Progressing   Problem: Clinical Measurements: Goal: Cardiovascular complication will be avoided Outcome: Progressing   Problem: Activity: Goal: Risk for activity intolerance will decrease Outcome: Progressing   Problem: Nutrition: Goal: Adequate nutrition will be maintained Outcome: Progressing   Problem: Coping: Goal: Level of anxiety will decrease Outcome: Progressing   Problem: Elimination: Goal: Will not experience complications related to bowel motility Outcome: Progressing   Problem: Elimination: Goal: Will not experience complications related to urinary retention Outcome: Progressing   Problem: Pain Managment: Goal: General experience of comfort will improve and/or be controlled Outcome: Progressing   Problem: Safety: Goal: Ability to remain free from injury will  improve Outcome: Progressing

## 2023-10-29 NOTE — Plan of Care (Signed)

## 2023-10-29 NOTE — Evaluation (Signed)
 Occupational Therapy Evaluation Patient Details Name: Brian Zavala MRN: [id number] DOB: 09/07/1950 Today's Date: 10/29/2023   History of Present Illness   Patient is a 73 yo male presenting to the ED with R sided abdominal pain on 10/26/23. Noted to have a UTI, and wounds on sacrum, groin, and LLE. Admitted with above diagnosis. PMHx: COPD, alcohol usage, neuropathy, type 2 diabetes, L toe amputation, R BKA, HLD, HTN, arthritis, current smoker, noncompliant BiPAP.     Clinical Impressions Prior to this admission, patient living alone, and independent at a wheelchair level. Patient still drives and per patient has an excellent support system. Currently, patient is back at his baseline with no complaints/symptoms and no need for any additional DME or OT intervention at this time. Patient would benefit from working with mobility specialist to maintain current level while hospitalized. OT will sign off at this time, please re-consult if further acute needs arise.      If plan is discharge home, recommend the following:    (N/A)     Functional Status Assessment   Patient has not had a recent decline in their functional status     Equipment Recommendations   None recommended by OT     Recommendations for Other Services         Precautions/Restrictions   Precautions Precautions: Fall Recall of Precautions/Restrictions: Intact Restrictions Weight Bearing Restrictions Per Provider Order: No     Mobility Bed Mobility Overal bed mobility: Independent                  Transfers Overall transfer level: Independent                 General transfer comment: lateral transfer to recliner      Balance Overall balance assessment: Independent                                         ADL either performed or assessed with clinical judgement   ADL Overall ADL's : At baseline                                       General  ADL Comments: Prior to this admission, patient living alone, and independent at a wheelchair level. Patient still drives and per patient has an excellent support system. Currently, patient is back at his baseline with no complaints/symptoms and no need for any additional DME or OT intervention at this time. Patient would benefit from working with mobility specialist to maintain current level while hospitalized. OT will sign off at this time, please re-consult if further acute needs arise.     Vision Baseline Vision/History: 1 Wears glasses Ability to See in Adequate Light: 0 Adequate Patient Visual Report: No change from baseline Vision Assessment?: No apparent visual deficits     Perception Perception: Not tested       Praxis Praxis: Not tested       Pertinent Vitals/Pain Pain Assessment Pain Assessment: No/denies pain     Extremity/Trunk Assessment Upper Extremity Assessment Upper Extremity Assessment: Overall WFL for tasks assessed;Right hand dominant   Lower Extremity Assessment Lower Extremity Assessment: Overall WFL for tasks assessed   Cervical / Trunk Assessment Cervical / Trunk Assessment: Normal   Communication Communication Communication: No apparent difficulties   Cognition Arousal: Alert Behavior  During Therapy: WFL for tasks assessed/performed Cognition: No apparent impairments                               Following commands: Intact       Cueing  General Comments   Cueing Techniques: Verbal cues      Exercises     Shoulder Instructions      Home Living Family/patient expects to be discharged to:: Private residence Living Arrangements: Alone Available Help at Discharge: Family;Friend(s);Available PRN/intermittently Type of Home: Mobile home Home Access: Ramped entrance     Home Layout: One level     Bathroom Shower/Tub: Producer, television/film/video: Handicapped height Bathroom Accessibility: Yes How Accessible:  Accessible via wheelchair Home Equipment: Agricultural consultant (2 wheels);Wheelchair - power          Prior Functioning/Environment Prior Level of Function : Independent/Modified Independent             Mobility Comments: Independent at w/c level and driving ADLs Comments: Independent    OT Problem List: Decreased activity tolerance   OT Treatment/Interventions:        OT Goals(Current goals can be found in the care plan section)   Acute Rehab OT Goals Patient Stated Goal: to go home OT Goal Formulation: With patient Time For Goal Achievement: 11/12/23 Potential to Achieve Goals: Good   OT Frequency:       Co-evaluation              AM-PAC OT 6 Clicks Daily Activity     Outcome Measure Help from another person eating meals?: None Help from another person taking care of personal grooming?: None Help from another person toileting, which includes using toliet, bedpan, or urinal?: None Help from another person bathing (including washing, rinsing, drying)?: None Help from another person to put on and taking off regular upper body clothing?: None Help from another person to put on and taking off regular lower body clothing?: None 6 Click Score: 24   End of Session Nurse Communication: Mobility status  Activity Tolerance: Patient tolerated treatment well Patient left: in chair;with call bell/phone within reach;with chair alarm set  OT Visit Diagnosis: Other abnormalities of gait and mobility (R26.89)                Time: 9140-9087 OT Time Calculation (min): 13 min Charges:  OT General Charges $OT Visit: 1 Visit OT Evaluation $OT Eval Moderate Complexity: 1 Mod  Ronal Gift E. Dmoni Fortson, OTR/L Acute Rehabilitation Services [phone]   Ronal Gift Salt 10/29/2023, 10:26 AM

## 2023-10-30 ENCOUNTER — Ambulatory Visit: Payer: Medicare (Managed Care) | Admitting: Podiatry

## 2023-10-30 DIAGNOSIS — R109 Unspecified abdominal pain: Secondary | ICD-10-CM | POA: Diagnosis not present

## 2023-10-30 LAB — BASIC METABOLIC PANEL WITH GFR
Anion gap: 9 (ref 5–15)
BUN: 8 mg/dL (ref 8–23)
CO2: 25 mmol/L (ref 22–32)
Calcium: 8.1 mg/dL — ABNORMAL LOW (ref 8.9–10.3)
Chloride: 102 mmol/L (ref 98–111)
Creatinine, Ser: 0.55 mg/dL — ABNORMAL LOW (ref 0.61–1.24)
GFR, Estimated: >60 mL/min (ref >60)
Glucose, Bld: 117 mg/dL — ABNORMAL HIGH (ref 70–99)
Potassium: 3.3 mmol/L — ABNORMAL LOW (ref 3.5–5.1)
Sodium: 136 mmol/L (ref 135–145)

## 2023-10-30 LAB — GLUCOSE, CAPILLARY
Glucose-Capillary: 119 mg/dL — ABNORMAL HIGH (ref 70–99)
Glucose-Capillary: 125 mg/dL — ABNORMAL HIGH (ref 70–99)
Glucose-Capillary: 318 mg/dL — ABNORMAL HIGH (ref 70–99)

## 2023-10-30 LAB — MAGNESIUM: Magnesium: 1.7 mg/dL (ref 1.7–2.4)

## 2023-10-30 LAB — CULTURE, BLOOD (ROUTINE X 2): Culture  Setup Time: NO GROWTH

## 2023-10-30 MED ORDER — SULFAMETHOXAZOLE-TRIMETHOPRIM 800-160 MG PO TABS: 1.0000 | ORAL_TABLET | Freq: Two times a day (BID) | ORAL | 0 refills | Status: DC

## 2023-10-30 MED ORDER — SULFAMETHOXAZOLE-TRIMETHOPRIM 800-160 MG PO TABS
1.0000 | ORAL_TABLET | Freq: Two times a day (BID) | ORAL | Status: DC
  Administered 2023-10-30: 1 via ORAL
  Filled 2023-10-30: qty 1

## 2023-10-30 MED ORDER — POTASSIUM CHLORIDE CRYS ER 20 MEQ PO TBCR
40.0000 meq | EXTENDED_RELEASE_TABLET | Freq: Once | ORAL | Status: AC
  Administered 2023-10-30: 40 meq via ORAL
  Filled 2023-10-30: qty 2

## 2023-10-30 NOTE — Plan of Care (Signed)

## 2023-10-30 NOTE — Discharge Summary (Signed)
 Physician Discharge Summary   Patient: Brian Zavala MRN: [id number] DOB: 1950-10-24  Admit date:     10/26/2023  Discharge date: 10/30/23  Discharge Physician: Deliliah Room   PCP: Cloria Annabella CROME, DO   Recommendations at discharge:    Follow up with your PCP in one week Continue taking meds as prescribed  Discharge Diagnoses: Principal Problem:   Intractable abdominal pain Active Problems:   Intractable nausea   UTI (urinary tract infection)   Essential hypertension   Type 2 diabetes mellitus with diabetic neuropathy, with long-term current use of insulin  (HCC)   Chronic pain   Peripheral vascular disease (HCC)   Rheumatoid arthritis (HCC)   Tobacco abuse   Carotid artery disease (HCC)   CVA (cerebral vascular accident) (HCC)   Gram-negative bacteremia   Sepsis [facility])   Hospital Course:  Intractable abdominal pain/nausea: Resolved now.  -Negative C. difficile and GI panel by PCR - Resumed home MS Contin  100 mg twice daily -Continue Protonix  -Tolerating soft diet -Dced IVF on 8/27.   Sepsis due to Klebsiella bacteremia and urinary tract infection,POA:resolved - Received IV ceftriaxone  2 g daily in the hospital. Switched to oral bactrim  on discharge for 6 days. -Afebrile and no leukocytosis on discharge.    Poorly controlled IDDM-2 with hyperglycemia: A1c 7.5%. - Continue home regimen.   Chronic abdominal and back pain - Continue home pain meds   Rheumatoid arthritis: On prednisone , Arava  and Enbrel . - Continue home prednisone    History of CVA/PAD/carotid artery stenosis - Resumed home aspirin , Plavix  and Lipitor    Essential hypertension: - Hold HCTZ - Continue home metoprolol    Right AKA/wheelchair dependence - PT/OT eval   Hyponatremia: Mild - Continue monitoring   Tobacco use disorder: Reports smoking about a pack a day - Encouraged smoking cessation - Out patient follow up with pulmonology for lung cancer screening.        Consultants:  None Procedures performed: None  Disposition: Home health Diet recommendation:  Regular diet DISCHARGE MEDICATION: Allergies as of 10/30/2023       Reactions   Varenicline Nausea Only        Medication List     TAKE these medications    aspirin  81 MG chewable tablet Chew 81 mg by mouth daily.   atorvastatin  80 MG tablet Commonly known as: LIPITOR  Take 1 tablet (80 mg total) by mouth daily at 6 PM.   Baqsimi  One Pack 3 MG/DOSE Powd Generic drug: Glucagon  Place 1 Device into the nose as needed (Low blood sugar with impaired consciousness).   calcium  carbonate 1500 (600 Ca) MG Tabs tablet Commonly known as: OSCAL Take 600 mg of elemental calcium  by mouth daily with breakfast.   cholecalciferol  1000 units tablet Commonly known as: VITAMIN D  Take 1,000 Units by mouth daily.   clopidogrel  75 MG tablet Commonly known as: PLAVIX  Take 1 tablet (75 mg total) by mouth daily.   Enbrel  50 MG/ML injection Generic drug: etanercept  Inject 50 mg into the skin once a week.   hydrochlorothiazide  12.5 MG capsule Commonly known as: MICROZIDE  Take 12.5 mg by mouth daily.   insulin  aspart 100 UNIT/ML injection Commonly known as: novoLOG  Inject 2-12 Units into the skin 4 (four) times daily -  before meals and at bedtime. Inject 2-12 units subcutaneously based on sliding scale:  131-180   2 units 181-240   4 units 241-300   6 units 301-350   8 units 351-400   10 units >400  12 units   insulin  glargine 100 UNIT/ML injection Commonly known as: Semglee  Inject 0.7 mLs (70 Units total) into the skin daily. What changed:  how much to take when to take this   ipratropium 0.06 % nasal spray Commonly known as: ATROVENT Place 2 sprays into both nostrils as needed for rhinitis.   leflunomide  20 MG tablet Commonly known as: ARAVA  Take 20 mg by mouth daily.   metFORMIN  500 MG 24 hr tablet Commonly known as: GLUCOPHAGE -XR Take 1,000 mg by mouth 2 (two) times daily with a  meal.   metoprolol  succinate 25 MG 24 hr tablet Commonly known as: TOPROL -XL Take 25 mg by mouth at bedtime.   MORPHINE  SULFATE ER PO Take 1 capsule by mouth every 12 (twelve) hours.   ondansetron  4 MG tablet Commonly known as: ZOFRAN  Take 1 tablet (4 mg total) by mouth every 6 (six) hours.   pantoprazole  40 MG tablet Commonly known as: PROTONIX  Take 1 tablet (40 mg total) by mouth daily before breakfast.   predniSONE  1 MG tablet Commonly known as: DELTASONE  Take 2 mg by mouth daily with breakfast.   pregabalin  150 MG capsule Commonly known as: LYRICA  Take 150 mg by mouth 2 (two) times daily.   PREVIDENT 5000 BOOSTER PLUS DT Place 1 application  onto teeth every evening.   promethazine  25 MG tablet Commonly known as: PHENERGAN  Take 25 mg by mouth every 8 (eight) hours as needed for nausea or vomiting. What changed: Another medication with the same name was changed. Make sure you understand how and when to take each.   promethazine  25 MG/ML injection Commonly known as: PHENERGAN  Inject 0.5 mLs (12.5 mg total) into the vein once as needed for nausea or vomiting. If not responding to oral promethazine . What changed: how much to take   sulfamethoxazole -trimethoprim  800-160 MG tablet Commonly known as: BACTRIM  DS Take 1 tablet by mouth every 12 (twelve) hours.   triamcinolone cream 0.1 % Commonly known as: KENALOG Apply 1 Application topically 2 (two) times daily as needed (skin rash).   vitamin B-12 100 MCG tablet Commonly known as: CYANOCOBALAMIN  Take 100 mcg by mouth daily.        Follow-up Information     Reed, Tiffany L, DO. Schedule an appointment as soon as possible for a visit in 1 week(s).   Specialty: Geriatric Medicine Contact information: [address] [place] [address] [phone]                Discharge Exam: Filed Weights   10/26/23 2300  Weight: 75.8 kg   General exam: Appears calm and comfortable  Respiratory system:  Clear to auscultation. Respiratory effort normal. Cardiovascular system: S1 & S2 heard, RRR. No JVD, murmurs, rubs, gallops or clicks. No pedal edema. Gastrointestinal system: Abdomen is nondistended, soft and nontender. No organomegaly or masses felt. Normal bowel sounds heard. Central nervous system: Alert and oriented. No focal neurological deficits. Extremities:s/p RT AKA Skin: No rashes, lesions or ulcers Psychiatry: Judgement and insight appear normal. Mood & affect appropriate.  Condition at discharge: good  The results of significant diagnostics from this hospitalization (including imaging, microbiology, ancillary and laboratory) are listed below for reference.   Imaging Studies: CT ANGIO ABDOMEN PELVIS  W & WO CONTRAST Result Date: 10/27/2023 CLINICAL DATA:  Lower GI bleeding EXAM: CTA ABDOMEN AND PELVIS WITHOUT AND WITH CONTRAST TECHNIQUE: Multidetector CT imaging of the abdomen and pelvis was performed using the standard protocol during bolus administration of intravenous contrast. Multiplanar reconstructed  images and MIPs were obtained and reviewed to evaluate the vascular anatomy. RADIATION DOSE REDUCTION: This exam was performed according to the departmental dose-optimization program which includes automated exposure control, adjustment of the mA and/or kV according to patient size and/or use of iterative reconstruction technique. CONTRAST:  OMNIPAQUE  IOHEXOL  350 MG/ML SOLN COMPARISON:  08/18/2023 FINDINGS: VASCULAR Aorta: Abdominal aorta demonstrates atherosclerotic calcifications. No dissection or aneurysmal dilatation is seen. Celiac: Calcification is noted at the origin without focal stenosis. SMA: Calcification is noted at the origin without focal stenosis. Renals: Calcific plaque is noted arteries without focal significant stenosis. IMA: Patent without evidence of aneurysm, dissection, vasculitis or significant stenosis. Inflow: Iliacs demonstrate heavy atherosclerotic  calcifications. 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 The superficial femoral artery on the right is occluded just beyond its origin. Veins: No specific venous abnormality is noted. Review of the MIP images confirms the above findings. NON-VASCULAR Lower chest: Interstitial fibrotic changes are noted. Emphysematous changes are seen. No focal confluent infiltrate or effusion is noted. Hepatobiliary: No focal liver abnormality is seen. Status post cholecystectomy. No biliary dilatation. Pancreas: Unremarkable. No pancreatic ductal dilatation or surrounding inflammatory changes. Spleen: Normal in size without focal abnormality. Adrenals/Urinary Tract: Adrenal glands are within normal limits. Kidneys demonstrate a normal enhancement pattern. Stable right renal cyst is noted. No follow-up is recommended. No obstructive changes are seen. The bladder is partially distended. Stomach/Bowel: The appendix is within normal limits. No obstructive or inflammatory changes of the colon are seen. Stomach is within normal limits. Large duodenal diverticulum is noted. No findings to suggest GI hemorrhage are seen. Lymphatic: Calcified Peri hepatic and portacaval lymph nodes are seen. These are stable in appearance from the prior study. Reproductive: Prostate is unremarkable. Other: No abdominal wall hernia or abnormality. No abdominopelvic ascites. Musculoskeletal: Degenerative changes of the lumbar spine are noted. IMPRESSION: VASCULAR Scattered atherosclerotic changes. 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 No evidence of active GI hemorrhage. NON-VASCULAR Chronic changes similar to that seen on the prior exam. No acute abnormality is noted. Electronically Signed   By: Oneil Devonshire M.D.   On: 10/27/2023 03:22    Microbiology: Results for orders placed or performed during the hospital encounter of 10/26/23  Urine Culture     Status: Abnormal   Collection Time:  10/27/23  4:16 AM   Specimen: Urine, Clean Catch  Result Value Ref Range Status   Specimen Description URINE, CLEAN CATCH  Final   Special Requests   Final    NONE Performed at [facility], [address], [place], [place] [address]    Culture >=100,000 COLONIES/mL KLEBSIELLA OXYTOCA (A)  Final   Report Status 10/29/2023 FINAL  Final   Organism ID, Bacteria KLEBSIELLA OXYTOCA (A)  Final      Susceptibility   Klebsiella oxytoca - MIC*    AMPICILLIN  >=32 RESISTANT Resistant     CEFEPIME <=0.12 SENSITIVE Sensitive     ERTAPENEM <=0.12 SENSITIVE Sensitive     CEFTRIAXONE  <=0.25 SENSITIVE Sensitive     CIPROFLOXACIN  <=0.06 SENSITIVE Sensitive     GENTAMICIN <=1 SENSITIVE Sensitive     NITROFURANTOIN 32 SENSITIVE Sensitive     TRIMETH /SULFA  <=20 SENSITIVE Sensitive     AMPICILLIN /SULBACTAM 4 SENSITIVE Sensitive     PIP/TAZO Value in next row Sensitive ug/mL     <=4 SENSITIVEThis is a modified FDA-approved test that has been validated and its performance characteristics determined by the reporting laboratory.  This  laboratory is certified under the Clinical Laboratory Improvement Amendments CLIA as qualified to perform high complexity clinical laboratory testing.    MEROPENEM Value in next row Sensitive      <=4 SENSITIVEThis is a modified FDA-approved test that has been validated and its performance characteristics determined by the reporting laboratory.  This laboratory is certified under the Clinical Laboratory Improvement Amendments CLIA as qualified to perform high complexity clinical laboratory testing.    * >=100,000 COLONIES/mL KLEBSIELLA OXYTOCA  Blood culture (routine x 2)     Status: Abnormal   Collection Time: 10/27/23  4:33 AM   Specimen: BLOOD  Result Value Ref Range Status   Specimen Description BLOOD LEFT ANTECUBITAL  Final   Special Requests   Final    BOTTLES DRAWN AEROBIC AND ANAEROBIC Blood Culture results may not be optimal due to an inadequate volume of blood  received in culture bottles   Culture  Setup Time   Final    GRAM NEGATIVE RODS AEROBIC BOTTLE ONLY CRITICAL RESULT CALLED TO, READ BACK BY AND VERIFIED WITH: PHARMD J LEDFORD 10/28/2023 @ 0222 BY AB Performed at [facility], [address], [place], [place] [address]    Culture KLEBSIELLA OXYTOCA (A)  Final   Report Status 10/30/2023 FINAL  Final   Organism ID, Bacteria KLEBSIELLA OXYTOCA  Final      Susceptibility   Klebsiella oxytoca - MIC*    AMPICILLIN  RESISTANT Resistant     CEFEPIME <=0.12 SENSITIVE Sensitive     ERTAPENEM <=0.12 SENSITIVE Sensitive     CEFTRIAXONE  <=0.25 SENSITIVE Sensitive     CIPROFLOXACIN  <=0.06 SENSITIVE Sensitive     GENTAMICIN <=1 SENSITIVE Sensitive     MEROPENEM <=0.25 SENSITIVE Sensitive     TRIMETH /SULFA  <=20 SENSITIVE Sensitive     AMPICILLIN /SULBACTAM 4 SENSITIVE Sensitive     PIP/TAZO Value in next row Sensitive ug/mL     <=4 SENSITIVEThis is a modified FDA-approved test that has been validated and its performance characteristics determined by the reporting laboratory.  This laboratory is certified under the Clinical Laboratory Improvement Amendments CLIA as qualified to perform high complexity clinical laboratory testing.    * KLEBSIELLA OXYTOCA  Blood culture (routine x 2)     Status: None (Preliminary result)   Collection Time: 10/27/23  4:33 AM   Specimen: BLOOD  Result Value Ref Range Status   Specimen Description BLOOD BLOOD RIGHT ARM  Final   Special Requests   Final    BOTTLES DRAWN AEROBIC AND ANAEROBIC Blood Culture adequate volume   Culture   Final    NO GROWTH 2 DAYS Performed at [facility], [address] [place], [place] [address]    Report Status PENDING  Incomplete  Blood Culture ID Panel (Reflexed)     Status: Abnormal   Collection Time: 10/27/23  4:33 AM  Result Value Ref Range Status   Enterococcus faecalis NOT DETECTED NOT DETECTED Final   Enterococcus Faecium NOT DETECTED NOT DETECTED Final   Listeria  monocytogenes NOT DETECTED NOT DETECTED Final   Staphylococcus species NOT DETECTED NOT DETECTED Final   Staphylococcus aureus (BCID) NOT DETECTED NOT DETECTED Final   Staphylococcus epidermidis NOT DETECTED NOT DETECTED Final   Staphylococcus lugdunensis NOT DETECTED NOT DETECTED Final   Streptococcus species NOT DETECTED NOT DETECTED Final   Streptococcus agalactiae NOT DETECTED NOT DETECTED Final   Streptococcus pneumoniae NOT DETECTED NOT DETECTED Final   Streptococcus pyogenes NOT DETECTED NOT DETECTED Final   A.calcoaceticus-baumannii NOT  DETECTED NOT DETECTED Final   Bacteroides fragilis NOT DETECTED NOT DETECTED Final   Enterobacterales DETECTED (A) NOT DETECTED Final    Comment: Enterobacterales represent a large order of gram negative bacteria, not a single organism. CRITICAL RESULT CALLED TO, READ BACK BY AND VERIFIED WITH: PHARMD J LEDFORD 10/28/2023 @ 0222 BY AB    Enterobacter cloacae complex NOT DETECTED NOT DETECTED Final   Escherichia coli NOT DETECTED NOT DETECTED Final   Klebsiella aerogenes NOT DETECTED NOT DETECTED Final   Klebsiella oxytoca DETECTED (A) NOT DETECTED Final    Comment: CRITICAL RESULT CALLED TO, READ BACK BY AND VERIFIED WITH: PHARMD J LEDFORD 10/28/2023 @ 0222 BY AB    Klebsiella pneumoniae NOT DETECTED NOT DETECTED Final   Proteus species NOT DETECTED NOT DETECTED Final   Salmonella species NOT DETECTED NOT DETECTED Final   Serratia marcescens NOT DETECTED NOT DETECTED Final   Haemophilus influenzae NOT DETECTED NOT DETECTED Final   Neisseria meningitidis NOT DETECTED NOT DETECTED Final   Pseudomonas aeruginosa NOT DETECTED NOT DETECTED Final   Stenotrophomonas maltophilia NOT DETECTED NOT DETECTED Final   Candida albicans NOT DETECTED NOT DETECTED Final   Candida auris NOT DETECTED NOT DETECTED Final   Candida glabrata NOT DETECTED NOT DETECTED Final   Candida krusei NOT DETECTED NOT DETECTED Final   Candida parapsilosis NOT DETECTED NOT  DETECTED Final   Candida tropicalis NOT DETECTED NOT DETECTED Final   Cryptococcus neoformans/gattii NOT DETECTED NOT DETECTED Final   CTX-M ESBL NOT DETECTED NOT DETECTED Final   Carbapenem resistance IMP NOT DETECTED NOT DETECTED Final   Carbapenem resistance KPC NOT DETECTED NOT DETECTED Final   Carbapenem resistance NDM NOT DETECTED NOT DETECTED Final   Carbapenem resist OXA 48 LIKE NOT DETECTED NOT DETECTED Final   Carbapenem resistance VIM NOT DETECTED NOT DETECTED Final    Comment: Performed at [facility], [address] [place], [place] [address]  C Difficile Quick Screen w PCR reflex     Status: None   Collection Time: 10/28/23  7:47 AM   Specimen: STOOL  Result Value Ref Range Status   C Diff antigen NEGATIVE NEGATIVE Final   C Diff toxin NEGATIVE NEGATIVE Final   C Diff interpretation No C. difficile detected.  Final    Comment: Performed at [facility], [address] [place], [place] [address]  Gastrointestinal Panel by PCR , Stool     Status: None   Collection Time: 10/28/23  7:48 AM   Specimen: Stool  Result Value Ref Range Status   Campylobacter species NOT DETECTED NOT DETECTED Final   Plesimonas shigelloides NOT DETECTED NOT DETECTED Final   Salmonella species NOT DETECTED NOT DETECTED Final   Yersinia enterocolitica NOT DETECTED NOT DETECTED Final   Vibrio species NOT DETECTED NOT DETECTED Final   Vibrio cholerae NOT DETECTED NOT DETECTED Final   Enteroaggregative E coli (EAEC) NOT DETECTED NOT DETECTED Final   Enteropathogenic E coli (EPEC) NOT DETECTED NOT DETECTED Final   Enterotoxigenic E coli (ETEC) NOT DETECTED NOT DETECTED Final   Shiga like toxin producing E coli (STEC) NOT DETECTED NOT DETECTED Final   Shigella/Enteroinvasive E coli (EIEC) NOT DETECTED NOT DETECTED Final   Cryptosporidium NOT DETECTED NOT DETECTED Final   Cyclospora cayetanensis NOT DETECTED NOT DETECTED Final   Entamoeba histolytica NOT DETECTED NOT DETECTED Final    Giardia lamblia NOT DETECTED NOT DETECTED Final   Adenovirus F40/41 NOT DETECTED NOT DETECTED Final   Astrovirus NOT DETECTED  NOT DETECTED Final   Norovirus GI/GII NOT DETECTED NOT DETECTED Final   Rotavirus A NOT DETECTED NOT DETECTED Final   Sapovirus (I, II, IV, and V) NOT DETECTED NOT DETECTED Final    Comment: Performed at [facility], [address] [place], [place] [address]    Labs: CBC: Recent Labs  Lab 10/26/23 2311 10/28/23 0348 10/29/23 0301  WBC 11.2* 9.7 6.8  HGB 14.9 13.3 13.1  HCT 47.7 41.6 40.3  MCV 84.7 81.3 81.1  PLT 314 281 264   Basic Metabolic Panel: Recent Labs  Lab 10/26/23 2311 10/28/23 1012 10/29/23 0301 10/30/23 0208  NA 134* 133* 137 136  K 4.2 2.7* 3.1* 3.3*  CL 95* 98 102 102  CO2 26 25 26 25   GLUCOSE 264* 199* 143* 117*  BUN 8 7* 5* 8  CREATININE 0.89 0.58* 0.51* 0.55*  CALCIUM  9.0 8.4* 8.2* 8.1*  MG  --  1.7 1.7 1.7   Liver Function Tests: Recent Labs  Lab 10/26/23 2311 10/29/23 0301  AST 20 20  ALT 18 16  ALKPHOS 83 64  BILITOT 1.0 0.6  PROT 7.6 6.0*  ALBUMIN 3.0* 2.3*   CBG: Recent Labs  Lab 10/29/23 1717 10/29/23 2017 10/29/23 2359 10/30/23 0352 10/30/23 0754  GLUCAP 182* 264* 125* 119* 318*    Discharge time spent: 42 minutes.  Signed: Deliliah Room, MD [facility] 10/30/2023

## 2023-11-01 LAB — CULTURE, BLOOD (ROUTINE X 2)
Culture: NO GROWTH
Special Requests: ADEQUATE

## 2023-11-20 ENCOUNTER — Encounter: Payer: Self-pay | Admitting: Podiatry

## 2023-11-20 ENCOUNTER — Ambulatory Visit (INDEPENDENT_AMBULATORY_CARE_PROVIDER_SITE_OTHER): Payer: Medicare (Managed Care) | Admitting: Podiatry

## 2023-11-20 ENCOUNTER — Ambulatory Visit: Payer: PRIVATE HEALTH INSURANCE

## 2023-11-20 VITALS — Ht 73.0 in | Wt 167.0 lb

## 2023-11-20 DIAGNOSIS — I739 Peripheral vascular disease, unspecified: Secondary | ICD-10-CM | POA: Diagnosis not present

## 2023-11-20 DIAGNOSIS — Z89611 Acquired absence of right leg above knee: Secondary | ICD-10-CM

## 2023-11-20 DIAGNOSIS — L97521 Non-pressure chronic ulcer of other part of left foot limited to breakdown of skin: Secondary | ICD-10-CM

## 2023-11-20 DIAGNOSIS — E1140 Type 2 diabetes mellitus with diabetic neuropathy, unspecified: Secondary | ICD-10-CM

## 2023-11-20 DIAGNOSIS — Z794 Long term (current) use of insulin: Secondary | ICD-10-CM

## 2023-11-20 NOTE — Patient Instructions (Signed)
 Instructions for Wound Care  The most important step to healing a foot wound is to reduce the pressure on your foot - it is extremely important to stay off your foot as much as possible and wear the shoe/boot as instructed.  Apply betadine  to the areas 1-2 times daily and otherwise keep dry. May hold bandage in place with Coban (self sticky wrap), Ace bandage or tape.  You may find dressing supplies at your local [employer], [employer], drug store or medical supply store.  Monitor for any signs/symptoms of infection. If there is any increase in redness, red streaks, increase in drainage, warmth to your foot please give us  a call. Also, if you start to run a fever or have flu-like symptoms that can also be a sign of infection. Call the office immediately if any occur or go directly to the emergency room.   If you have any questions, please feel free to give us  a call at [phone] or if you are on MyChart you can always send me a message if needed.

## 2023-11-20 NOTE — Progress Notes (Signed)
 Chief Complaint  Patient presents with   Nail Problem    Pt is here due to left great toenail, he is here today with his sister [name] who is a nurse, Toenail came off about 3-4 months ago, has been treating it with betadine , wants to know if they have been treating it correctly, no pain or swelling.    HPI: 73 y.o. male presents today accompanied by sister who is a Engineer, civil (consulting) and assist with his care.  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  Does see vascular surgery and is actually following up with them next week.  They are wanting to look at the left first toe where nail plate came off about 3 to 4 months ago and there is significant callus to the end of the toe.  There is also ulceration present to the dorsal second PIPJ and they have been treating both with Betadine .  Last A1c 7.2-7.4 range per family member.  Past Medical History:  Diagnosis Date   Arthritis    pretty much all over   Cellulitis of left foot    Colitis    COPD (chronic obstructive pulmonary disease) (HCC)    Dyspnea    Hx of BKA, right (HCC)    Hyperlipidemia    Hypertension    Nausea and vomiting 08/20/2022   Neuromuscular disorder (HCC)    neuropathy legs   Neuropathy    legs   Osteomyelitis (HCC) 04/2018   4th toe left foot   Osteomyelitis of fourth toe of left foot (HCC) 04/19/2018   Peripheral vascular disease (HCC)    Rheumatoid arthritis (HCC)    Sleep apnea    does not use a cpap   Stroke (HCC)    Type II diabetes mellitus (HCC)    Wears glasses    Wears partial dentures    top and bottom partials    Past Surgical History:  Procedure Laterality Date   ABDOMINAL AORTOGRAM W/LOWER EXTREMITY Bilateral 10/19/2018   Procedure: ABDOMINAL AORTOGRAM W/LOWER EXTREMITY;  Surgeon: Court Dorn PARAS, MD;  Location: [facility];  Service: Cardiovascular;  Laterality: Bilateral;   ABOVE KNEE LEG AMPUTATION Right 11/27/2018   AMPUTATION Left 04/22/2018   Procedure: LEFT FOOT 4TH  RAY AMPUTATION;  Surgeon: Harden Jerona GAILS, MD;  Location: [facility] OR;  Service: Orthopedics;  Laterality: Left;   AMPUTATION Right 11/27/2018   Procedure: RIGHT ABOVE KNEE AMPUTATION;  Surgeon: Harden Jerona GAILS, MD;  Location: [facility] OR;  Service: Orthopedics;  Laterality: Right;   BACK SURGERY     BIOPSY  04/21/2023   Procedure: BIOPSY;  Surgeon: Charlanne Groom, MD;  Location: [facility];  Service: Gastroenterology;;   CHOLECYSTECTOMY N/A 10/26/2013   Procedure: LAPAROSCOPIC CHOLECYSTECTOMY WITH INTRAOPERATIVE CHOLANGIOGRAM;  Surgeon: Debby LABOR. Cornett, MD;  Location: [facility];  Service: General;  Laterality: N/A;   COLONOSCOPY     COLONOSCOPY WITH PROPOFOL  N/A 04/21/2023   Procedure: COLONOSCOPY WITH PROPOFOL ;  Surgeon: Charlanne Groom, MD;  Location: [facility];  Service: Gastroenterology;  Laterality: N/A;   CYST EXCISION Bilateral 02/15/2015   Procedure: LEFT INDEX FINGER AND RIGHT MIDDLE FINGER NODULE EXCISION;  Surgeon: Kay CHRISTELLA Cummins, MD;  Location: [facility];  Service: Orthopedics;  Laterality: Bilateral;   CYST EXCISION PERINEAL N/A 12/08/2012   Procedure: CYST EXCISION PERINeum;  Surgeon: Debby A. Cornett, MD;  Location: [facility];  Service: General;  Laterality: N/A;   ESOPHAGOGASTRODUODENOSCOPY (EGD) WITH PROPOFOL  N/A 04/21/2023  Procedure: ESOPHAGOGASTRODUODENOSCOPY (EGD) WITH PROPOFOL ;  Surgeon: Charlanne Groom, MD;  Location: [facility];  Service: Gastroenterology;  Laterality: N/A;   FOOT AMPUTATION Right 2005   I & D EXTREMITY Right 03/07/2017   Procedure: EXCISION FIBULAR HEAD RIGHT BELOW KNEE AMPUTATION;  Surgeon: Harden Jerona GAILS, MD;  Location: [facility] OR;  Service: Orthopedics;  Laterality: Right;   INCISE AND DRAIN ABCESS  12/02/2014   PERINEAL ABSCESS   INCISION AND DRAINAGE PERIRECTAL ABSCESS Left 12/02/2014   Procedure: IRRIGATION AND DEBRIDEMENT PERINEAL ABSCESS;  Surgeon: Vicenta Poli, MD;  Location: [facility];  Service: General;  Laterality: Left;   LEG  AMPUTATION BELOW KNEE Right 2005   LOWER EXTREMITY ANGIOGRAPHY N/A 05/05/2017   Procedure: LOWER EXTREMITY ANGIOGRAPHY;  Surgeon: Court Dorn PARAS, MD;  [facility]: [facility];  Service: Cardiovascular;  Laterality: N/A;   LUMBAR DISC SURGERY  82,90   ruptured disc   PERIPHERAL VASCULAR INTERVENTION Right 05/05/2017   Procedure: PERIPHERAL VASCULAR INTERVENTION;  Surgeon: Court Dorn PARAS, MD;  Location: [facility];  Service: Cardiovascular;  Laterality: Right;   PERIPHERAL VASCULAR INTERVENTION Right 10/19/2018   Procedure: PERIPHERAL VASCULAR INTERVENTION;  Surgeon: Court Dorn PARAS, MD;  [facility]: [facility];  Service: Cardiovascular;  Laterality: Right;   SCAR REVISION Right 2005   @ amputation   STUMP REVISION Right 08/21/2018   Procedure: REVISION RIGHT BELOW KNEE AMPUTATION, EXCISION FIBULA;  Surgeon: Harden Jerona GAILS, MD;  Location: [facility];  Service: Orthopedics;  Laterality: Right;   STUMP REVISION Right 09/09/2018   Procedure: REVISION RIGHT BELOW KNEE AMPUTATION;  Surgeon: Harden Jerona GAILS, MD;  Location: [facility] OR;  Service: Orthopedics;  Laterality: Right;   TOE AMPUTATION Right 2005   TRANSCAROTID ARTERY REVASCULARIZATION  Left 09/04/2022   Procedure: Left Transcarotid Artery Revascularization;  Surgeon: Serene Gaile ORN, MD;  Location: [facility] OR;  Service: Vascular;  Laterality: Left;   ULTRASOUND GUIDANCE FOR VASCULAR ACCESS Right 09/04/2022   Procedure: ULTRASOUND GUIDANCE FOR VASCULAR ACCESS, RIGHT FEMORAL VEIN;  Surgeon: Serene Gaile ORN, MD;  Location: [facility];  Service: Vascular;  Laterality: Right;    Allergies  Allergen Reactions   Varenicline Nausea Only    ROS denies any nausea, vomiting, fever, chills, chest pain, shortness of breath   Physical Exam: There were no vitals filed for this visit.  General: The patient is alert and oriented x3 in no acute distress.  Dermatology: Pedal skin thin and atrophic.  There is ulceration with fat layer exposed second toe  left foot dorsal PIPJ 0.5 x 0.5 x 0.2 cm.  Fibrotic base.  No surrounding erythema.  No drainage.  No malodor.  Left first toe distal aspect there is hyperkeratotic tissue distal aspect with areas of darkening, some nail plate growth.  Nail plates x 4 are thickened, dystrophic with yellow discoloration and subungual debris, mycotic appearance.  Vascular: Left foot DP and PT pulses not readily palpable.  Cap refill approximately 3 seconds to the digits.  Pedal hair growth absent.  No edema.  Neurological: Protective sensation decreased.  Musculoskeletal Exam: Prior left foot fourth toe amputation.  Right above-knee amputation.  Mild hammertoes of remaining digits left foot  Radiographic Exam:  Normal osseous mineralization. Joint spaces preserved.  No fractures or osseous irregularities noted.  Assessment/Plan of Care: 1. Toe ulcer, left, limited to breakdown of skin (HCC)   2. PAD (peripheral artery disease) (HCC)   3. Type 2 diabetes mellitus with diabetic neuropathy, with long-term current use of insulin  (  HCC)   4. History of above knee amputation, right (HCC)      No orders of the defined types were placed in this encounter.  None  Discussed clinical findings with patient today.  # Ulcer of left second toe dorsal PIPJ # PAD - Findings discussed with patient - There is also concern for possible underlying ulceration of the left first toe - He does have vascular follow-up in the next week - No excisional debridement was performed today due to vascular status.  Pain to the first toe with Betadine  and applied Betadine  manage to the left second toe - Continue with this 1-2 times a day for both sites - Did attempt to obtain in office radiographs today.  Unable to obtain full study due to technical issues also patient is using motorized wheelchair currently due to right side AKA.  New stat order placed for radiographs, nonweightbearing okay.  Evaluate for underlying osteomyelitis or signs  of soft tissue infection for first toe, second toe.  Discussed importance of tight glucose control.  Continue to monitor site for signs and symptoms of infection.  Discussed appropriate shoe gear and padding of any bony areas to prevent ulceration.  High risk of further limb loss due to known PAD and history of right above-knee amputation. Follow-up in 2 to 3 weeks for ulcer recheck after he has seen vascular surgery.   Ervin Hensley L. Lamount MAUL, AACFAS [facility]     [address] [place], [place] [address]                Office [phone]  Fax [phone]

## 2023-11-24 ENCOUNTER — Ambulatory Visit (HOSPITAL_COMMUNITY)
Admission: RE | Admit: 2023-11-24 | Discharge: 2023-11-24 | Disposition: A | Discharge status: Home / Self Care | Payer: Medicare (Managed Care) | Source: Ambulatory Visit | Attending: Surgery | Admitting: Surgery

## 2023-11-24 ENCOUNTER — Ambulatory Visit (HOSPITAL_BASED_OUTPATIENT_CLINIC_OR_DEPARTMENT_OTHER)
Admission: RE | Admit: 2023-11-24 | Discharge: 2023-11-24 | Disposition: A | Discharge status: Home / Self Care | Payer: Medicare (Managed Care) | Source: Ambulatory Visit | Attending: Surgery | Admitting: Surgery

## 2023-11-24 ENCOUNTER — Ambulatory Visit: Payer: Medicare (Managed Care) | Admitting: Surgery

## 2023-11-24 ENCOUNTER — Ambulatory Visit: Payer: Medicare (Managed Care) | Attending: Surgery | Admitting: Surgery

## 2023-11-24 ENCOUNTER — Encounter: Payer: Self-pay | Admitting: Surgery

## 2023-11-24 VITALS — BP 96/65 | HR 89 | Temp 97.5°F

## 2023-11-24 DIAGNOSIS — I70248 Atherosclerosis of native arteries of left leg with ulceration of other part of lower left leg: Secondary | ICD-10-CM

## 2023-11-24 DIAGNOSIS — I6523 Occlusion and stenosis of bilateral carotid arteries: Secondary | ICD-10-CM | POA: Insufficient documentation

## 2023-11-24 DIAGNOSIS — I70229 Atherosclerosis of native arteries of extremities with rest pain, unspecified extremity: Secondary | ICD-10-CM | POA: Diagnosis not present

## 2023-11-24 DIAGNOSIS — I70213 Atherosclerosis of native arteries of extremities with intermittent claudication, bilateral legs: Secondary | ICD-10-CM

## 2023-11-24 LAB — VAS US ABI WITH/WO TBI: Left ABI: 0.88

## 2023-11-24 NOTE — Progress Notes (Addendum)
 Vascular and Vein Specialist of   Patient name: Brian Zavala MRN: [id number] DOB: 02-Oct-1950 Sex: male   REASON FOR VISIT:    Follow-up  HISOTRY OF PRESENT ILLNESS:   Brian Zavala is a 73 y.o. male who presented to the hospital on 09/01/2022 with right eye vision loss.  His workup revealed a infarct at the left occipital lobe.  CT scan revealed high-grade bilateral carotid stenosis.  On 09/04/2022, he underwent left-sided TCAR.  Intraoperative images revealed 80% stenosis that was resolved after stenting.  He was discharged home the following day.  He has had no new neurologic symptoms and his vision is about back to baseline.  He denies claudication symptoms.  He does not have any nonhealing wounds     The patient has an extensive history of peripheral vascular disease and has undergone percutaneous intervention by Dr. Court in the past.  He has had a right above-knee amputation.  He denies any symptoms in his left leg such as claudication, or ulceration.       PAST MEDICAL HISTORY:   Past Medical History:  Diagnosis Date   Arthritis    pretty much all over   Cellulitis of left foot    Colitis    COPD (chronic obstructive pulmonary disease) (HCC)    Dyspnea    Hx of BKA, right (HCC)    Hyperlipidemia    Hypertension    Nausea and vomiting 08/20/2022   Neuromuscular disorder (HCC)    neuropathy legs   Neuropathy    legs   Osteomyelitis (HCC) 04/2018   4th toe left foot   Osteomyelitis of fourth toe of left foot (HCC) 04/19/2018   Peripheral vascular disease (HCC)    Rheumatoid arthritis (HCC)    Sleep apnea    does not use a cpap   Stroke (HCC)    Type II diabetes mellitus (HCC)    Wears glasses    Wears partial dentures    top and bottom partials     FAMILY HISTORY:   Family History  Problem Relation Age of Onset   Hypothyroidism Mother    Diabetes Mother    Cirrhosis Father    Diabetes Maternal  Grandmother    Thyroid disease Daughter     SOCIAL HISTORY:   Social History   Tobacco Use   Smoking status: Every Day    Current packs/day: 0.50    Average packs/day: 1 pack/day for 58.7 years (57.9 ttl pk-yrs)    Types: Cigarettes    Start date: 03/04/1965   Smokeless tobacco: Never  Substance Use Topics   Alcohol use: No     ALLERGIES:   Allergies  Allergen Reactions   Varenicline Nausea Only     CURRENT MEDICATIONS:   Current Outpatient Medications  Medication Sig Dispense Refill   aspirin  81 MG chewable tablet Chew 81 mg by mouth daily.     atorvastatin  (LIPITOR ) 80 MG tablet Take 1 tablet (80 mg total) by mouth daily at 6 PM. 30 tablet 2   calcium  carbonate (OSCAL) 1500 (600 Ca) MG TABS tablet Take 600 mg of elemental calcium  by mouth daily with breakfast.      cholecalciferol  (VITAMIN D ) 1000 UNITS tablet Take 1,000 Units by mouth daily.     clopidogrel  (PLAVIX ) 75 MG tablet Take 1 tablet (75 mg total) by mouth daily. 30 tablet 0   etanercept  (ENBREL ) 50 MG/ML injection Inject 50 mg into the skin once a week.     Glucagon  (  BAQSIMI  ONE PACK) 3 MG/DOSE POWD Place 1 Device into the nose as needed (Low blood sugar with impaired consciousness). 2 each 3   hydrochlorothiazide  (MICROZIDE ) 12.5 MG capsule Take 12.5 mg by mouth daily.     insulin  aspart (NOVOLOG ) 100 UNIT/ML injection Inject 2-12 Units into the skin 4 (four) times daily -  before meals and at bedtime. Inject 2-12 units subcutaneously based on sliding scale:  131-180   2 units 181-240   4 units 241-300   6 units 301-350   8 units 351-400   10 units >400        12 units     insulin  glargine (SEMGLEE ) 100 UNIT/ML injection Inject 0.7 mLs (70 Units total) into the skin daily. (Patient taking differently: Inject 72 Units into the skin in the morning.) 63 mL 1   ipratropium (ATROVENT) 0.06 % nasal spray Place 2 sprays into both nostrils as needed for rhinitis.     leflunomide  (ARAVA ) 20 MG tablet Take 20 mg by  mouth daily.     metFORMIN  (GLUCOPHAGE -XR) 500 MG 24 hr tablet Take 1,000 mg by mouth 2 (two) times daily with a meal.     metoprolol  succinate (TOPROL -XL) 25 MG 24 hr tablet Take 25 mg by mouth at bedtime.     MORPHINE  SULFATE ER PO Take 1 capsule by mouth every 12 (twelve) hours.     ondansetron  (ZOFRAN ) 4 MG tablet Take 1 tablet (4 mg total) by mouth every 6 (six) hours. 12 tablet 0   pantoprazole  (PROTONIX ) 40 MG tablet Take 1 tablet (40 mg total) by mouth daily before breakfast. 30 tablet 3   predniSONE  (DELTASONE ) 1 MG tablet Take 2 mg by mouth daily with breakfast.     pregabalin  (LYRICA ) 150 MG capsule Take 150 mg by mouth 2 (two) times daily.     promethazine  (PHENERGAN ) 25 MG tablet Take 25 mg by mouth every 8 (eight) hours as needed for nausea or vomiting.     promethazine  (PHENERGAN ) 25 MG/ML injection Inject 0.5 mLs (12.5 mg total) into the vein once as needed for nausea or vomiting. If not responding to oral promethazine . (Patient taking differently: Inject 25 mg into the vein once as needed for nausea or vomiting. If not responding to oral promethazine .) 1 mL 0   Sodium Fluoride (PREVIDENT 5000 BOOSTER PLUS DT) Place 1 application  onto teeth every evening.     sulfamethoxazole -trimethoprim  (BACTRIM  DS) 800-160 MG tablet Take 1 tablet by mouth every 12 (twelve) hours. 12 tablet 0   triamcinolone cream (KENALOG) 0.1 % Apply 1 Application topically 2 (two) times daily as needed (skin rash).     vitamin B-12 (CYANOCOBALAMIN ) 100 MCG tablet Take 100 mcg by mouth daily.     No current facility-administered medications for this visit.    REVIEW OF SYSTEMS:   [X]  denotes positive finding, [ ]  denotes negative finding Cardiac  Comments:  Chest pain or chest pressure:    Shortness of breath upon exertion:    Short of breath when lying flat:    Irregular heart rhythm:        Vascular    Pain in calf, thigh, or hip brought on by ambulation:    Pain in feet at night that wakes you up  from your sleep:     Blood clot in your veins:    Leg swelling:         Pulmonary    Oxygen at home:    Productive cough:  Wheezing:         Neurologic    Sudden weakness in arms or legs:     Sudden numbness in arms or legs:     Sudden onset of difficulty speaking or slurred speech:    Temporary loss of vision in one eye:     Problems with dizziness:         Gastrointestinal    Blood in stool:     Vomited blood:         Genitourinary    Burning when urinating:     Blood in urine:        Psychiatric    Major depression:         Hematologic    Bleeding problems:    Problems with blood clotting too easily:        Skin    Rashes or ulcers:        Constitutional    Fever or chills:      PHYSICAL EXAM:   Vitals:   11/24/23 1150 11/24/23 1152  BP: 130/77 96/65  Pulse: 89   Temp: (!) 97.5 F (36.4 C)   SpO2: 90%     GENERAL: The patient is a well-nourished male, in no acute distress. The vital signs are documented above. CARDIAC: There is a regular rate and rhythm.  PULMONARY: Non-labored respirations ABDOMEN: Soft and non-tender with normal pitched bowel sounds.  MUSCULOSKELETAL: Right above-knee potation. NEUROLOGIC: No focal weakness or paresthesias are detected. SKIN: There are no ulcers or rashes noted. PSYCHIATRIC: The patient has a normal affect.  STUDIES:   I have reviewed the following: +-------+-----------+-----------+------------+------------+  ABI/TBIToday's ABIToday's TBIPrevious ABIPrevious TBI  +-------+-----------+-----------+------------+------------+  Right BKA                   BKA                       +-------+-----------+-----------+------------+------------+  Left  0.88       0.47       0.89        0.34          +-------+-----------+-----------+------------+------------+   Carotid Right Carotid: Velocities in the right ICA are consistent with a 40-59%                 stenosis. The ECA appears >50% stenosed.    Left Carotid: The ECA appears >50% stenosed. Patent stent with no evidence  for               restenosis.   Vertebrals:  Bilateral vertebral arteries demonstrate antegrade flow.  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 Normal flow hemodynamics were seen in the left  subclavian              artery.    MEDICAL ISSUES:   Carotid: Left TCAR stent is widely patent.  He has moderate stenosis on the right.  He is asymptomatic.  I will repeat his ultrasound in a year  PAD: He remains asymptomatic on the left leg.  Follow-up in 1 year    Malvina New, IV, MD, FACS [facility] [phone] Pager [phone]    Addendum: It was brought to my attention that the patient  has a ulcer on his second toe.  I did not realize.  I called him and discussed this with him and based off of his blood flow and toe pressure of 63 I think he needs to undergo angiography to evaluate his blood flow and intervene if indicated.  He requested that I do this.  I will schedule this for 1 week.

## 2023-11-24 NOTE — Addendum Note (Signed)
 Addended by: SERENE GAILE ORN on: 11/24/2023 04:24 PM   Modules accepted: Level of Service

## 2023-11-24 NOTE — H&P (View-Only) (Signed)
 [facility]   Patient name: Brian Zavala MRN: [id number] DOB: 02-Oct-1950 Sex: male   REASON FOR VISIT:    Follow-up  HISOTRY OF PRESENT ILLNESS:   Brian Zavala is a 73 y.o. male who presented to the hospital on 09/01/2022 with right eye vision loss.  His workup revealed a infarct at the left occipital lobe.  CT scan revealed high-grade bilateral carotid stenosis.  On 09/04/2022, he underwent left-sided TCAR.  Intraoperative images revealed 80% stenosis that was resolved after stenting.  He was discharged home the following day.  He has had no new neurologic symptoms and his vision is about back to baseline.  He denies claudication symptoms.  He does not have any nonhealing wounds     The patient has an extensive history of peripheral vascular disease and has undergone percutaneous intervention by Dr. Court in the past.  He has had a right above-knee amputation.  He denies any symptoms in his left leg such as claudication, or ulceration.       PAST MEDICAL HISTORY:   Past Medical History:  Diagnosis Date   Arthritis    pretty much all over   Cellulitis of left foot    Colitis    COPD (chronic obstructive pulmonary disease) (HCC)    Dyspnea    Hx of BKA, right (HCC)    Hyperlipidemia    Hypertension    Nausea and vomiting 08/20/2022   Neuromuscular disorder (HCC)    neuropathy legs   Neuropathy    legs   Osteomyelitis (HCC) 04/2018   4th toe left foot   Osteomyelitis of fourth toe of left foot (HCC) 04/19/2018   Peripheral vascular disease (HCC)    Rheumatoid arthritis (HCC)    Sleep apnea    does not use a cpap   Stroke (HCC)    Type II diabetes mellitus (HCC)    Wears glasses    Wears partial dentures    top and bottom partials     FAMILY HISTORY:   Family History  Problem Relation Age of Onset   Hypothyroidism Mother    Diabetes Mother    Cirrhosis Father    Diabetes Maternal  Grandmother    Thyroid disease Daughter     SOCIAL HISTORY:   Social History   Tobacco Use   Smoking status: Every Day    Current packs/day: 0.50    Average packs/day: 1 pack/day for 58.7 years (57.9 ttl pk-yrs)    Types: Cigarettes    Start date: 03/04/1965   Smokeless tobacco: Never  Substance Use Topics   Alcohol use: No     ALLERGIES:   Allergies  Allergen Reactions   Varenicline Nausea Only     CURRENT MEDICATIONS:   Current Outpatient Medications  Medication Sig Dispense Refill   aspirin  81 MG chewable tablet Chew 81 mg by mouth daily.     atorvastatin  (LIPITOR ) 80 MG tablet Take 1 tablet (80 mg total) by mouth daily at 6 PM. 30 tablet 2   calcium  carbonate (OSCAL) 1500 (600 Ca) MG TABS tablet Take 600 mg of elemental calcium  by mouth daily with breakfast.      cholecalciferol  (VITAMIN D ) 1000 UNITS tablet Take 1,000 Units by mouth daily.     clopidogrel  (PLAVIX ) 75 MG tablet Take 1 tablet (75 mg total) by mouth daily. 30 tablet 0   etanercept  (ENBREL ) 50 MG/ML injection Inject 50 mg into the skin once a week.     Glucagon  (  BAQSIMI  ONE PACK) 3 MG/DOSE POWD Place 1 Device into the nose as needed (Low blood sugar with impaired consciousness). 2 each 3   hydrochlorothiazide  (MICROZIDE ) 12.5 MG capsule Take 12.5 mg by mouth daily.     insulin  aspart (NOVOLOG ) 100 UNIT/ML injection Inject 2-12 Units into the skin 4 (four) times daily -  before meals and at bedtime. Inject 2-12 units subcutaneously based on sliding scale:  131-180   2 units 181-240   4 units 241-300   6 units 301-350   8 units 351-400   10 units >400        12 units     insulin  glargine (SEMGLEE ) 100 UNIT/ML injection Inject 0.7 mLs (70 Units total) into the skin daily. (Patient taking differently: Inject 72 Units into the skin in the morning.) 63 mL 1   ipratropium (ATROVENT) 0.06 % nasal spray Place 2 sprays into both nostrils as needed for rhinitis.     leflunomide  (ARAVA ) 20 MG tablet Take 20 mg by  mouth daily.     metFORMIN  (GLUCOPHAGE -XR) 500 MG 24 hr tablet Take 1,000 mg by mouth 2 (two) times daily with a meal.     metoprolol  succinate (TOPROL -XL) 25 MG 24 hr tablet Take 25 mg by mouth at bedtime.     MORPHINE  SULFATE ER PO Take 1 capsule by mouth every 12 (twelve) hours.     ondansetron  (ZOFRAN ) 4 MG tablet Take 1 tablet (4 mg total) by mouth every 6 (six) hours. 12 tablet 0   pantoprazole  (PROTONIX ) 40 MG tablet Take 1 tablet (40 mg total) by mouth daily before breakfast. 30 tablet 3   predniSONE  (DELTASONE ) 1 MG tablet Take 2 mg by mouth daily with breakfast.     pregabalin  (LYRICA ) 150 MG capsule Take 150 mg by mouth 2 (two) times daily.     promethazine  (PHENERGAN ) 25 MG tablet Take 25 mg by mouth every 8 (eight) hours as needed for nausea or vomiting.     promethazine  (PHENERGAN ) 25 MG/ML injection Inject 0.5 mLs (12.5 mg total) into the vein once as needed for nausea or vomiting. If not responding to oral promethazine . (Patient taking differently: Inject 25 mg into the vein once as needed for nausea or vomiting. If not responding to oral promethazine .) 1 mL 0   Sodium Fluoride (PREVIDENT 5000 BOOSTER PLUS DT) Place 1 application  onto teeth every evening.     sulfamethoxazole -trimethoprim  (BACTRIM  DS) 800-160 MG tablet Take 1 tablet by mouth every 12 (twelve) hours. 12 tablet 0   triamcinolone cream (KENALOG) 0.1 % Apply 1 Application topically 2 (two) times daily as needed (skin rash).     vitamin B-12 (CYANOCOBALAMIN ) 100 MCG tablet Take 100 mcg by mouth daily.     No current facility-administered medications for this visit.    REVIEW OF SYSTEMS:   [X]  denotes positive finding, [ ]  denotes negative finding Cardiac  Comments:  Chest pain or chest pressure:    Shortness of breath upon exertion:    Short of breath when lying flat:    Irregular heart rhythm:        Vascular    Pain in calf, thigh, or hip brought on by ambulation:    Pain in feet at night that wakes you up  from your sleep:     Blood clot in your veins:    Leg swelling:         Pulmonary    Oxygen at home:    Productive cough:  Wheezing:         Neurologic    Sudden weakness in arms or legs:     Sudden numbness in arms or legs:     Sudden onset of difficulty speaking or slurred speech:    Temporary loss of vision in one eye:     Problems with dizziness:         Gastrointestinal    Blood in stool:     Vomited blood:         Genitourinary    Burning when urinating:     Blood in urine:        Psychiatric    Major depression:         Hematologic    Bleeding problems:    Problems with blood clotting too easily:        Skin    Rashes or ulcers:        Constitutional    Fever or chills:      PHYSICAL EXAM:   Vitals:   11/24/23 1150 11/24/23 1152  BP: 130/77 96/65  Pulse: 89   Temp: (!) 97.5 F (36.4 C)   SpO2: 90%     GENERAL: The patient is a well-nourished male, in no acute distress. The vital signs are documented above. CARDIAC: There is a regular rate and rhythm.  PULMONARY: Non-labored respirations ABDOMEN: Soft and non-tender with normal pitched bowel sounds.  MUSCULOSKELETAL: Right above-knee potation. NEUROLOGIC: No focal weakness or paresthesias are detected. SKIN: There are no ulcers or rashes noted. PSYCHIATRIC: The patient has a normal affect.  STUDIES:   I have reviewed the following: +-------+-----------+-----------+------------+------------+  ABI/TBIToday's ABIToday's TBIPrevious ABIPrevious TBI  +-------+-----------+-----------+------------+------------+  Right BKA                   BKA                       +-------+-----------+-----------+------------+------------+  Left  0.88       0.47       0.89        0.34          +-------+-----------+-----------+------------+------------+   Carotid Right Carotid: Velocities in the right ICA are consistent with a 40-59%                 stenosis. The ECA appears >50% stenosed.    Left Carotid: The ECA appears >50% stenosed. Patent stent with no evidence  for               restenosis.   Vertebrals:  Bilateral vertebral arteries demonstrate antegrade flow.  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 Normal flow hemodynamics were seen in the left  subclavian              artery.    MEDICAL ISSUES:   Carotid: Left TCAR stent is widely patent.  He has moderate stenosis on the right.  He is asymptomatic.  I will repeat his ultrasound in a year  PAD: He remains asymptomatic on the left leg.  Follow-up in 1 year    Malvina New, IV, MD, FACS [facility] [phone] Pager [phone]    Addendum: It was brought to my attention that the patient  has a ulcer on his second toe.  I did not realize.  I called him and discussed this with him and based off of his blood flow and toe pressure of 63 I think he needs to undergo angiography to evaluate his blood flow and intervene if indicated.  He requested that I do this.  I will schedule this for 1 week.

## 2023-11-25 ENCOUNTER — Other Ambulatory Visit: Payer: Self-pay

## 2023-11-25 DIAGNOSIS — I70213 Atherosclerosis of native arteries of extremities with intermittent claudication, bilateral legs: Secondary | ICD-10-CM

## 2023-12-02 ENCOUNTER — Encounter (HOSPITAL_COMMUNITY): Admission: RE | Payer: Self-pay | Source: Home / Self Care

## 2023-12-02 ENCOUNTER — Ambulatory Visit (HOSPITAL_COMMUNITY): Admission: RE | Admit: 2023-12-02 | Payer: Medicare (Managed Care) | Source: Home / Self Care | Admitting: Surgery

## 2023-12-02 SURGERY — ABDOMINAL AORTOGRAM
Anesthesia: LOCAL

## 2023-12-03 ENCOUNTER — Telehealth: Payer: Self-pay | Admitting: Gastroenterology

## 2023-12-03 ENCOUNTER — Telehealth: Payer: Self-pay | Admitting: *Deleted

## 2023-12-03 NOTE — Telephone Encounter (Signed)
 Attempted to call for surgery scheduling.

## 2023-12-03 NOTE — Telephone Encounter (Signed)
 Inbound call from patient's sister stating patient is has been very sick. States after eating he experiences extreme abdominal pain, and nausea. States she is very worried about patient. Patient's sisters [name], requesting a call at [phone]. Please advise, thank you

## 2023-12-03 NOTE — Telephone Encounter (Signed)
 Pt sister Jacque stated that she is a Engineer, civil (consulting) and that she is concerned for the pt. Jacgue stated that the pt has been having ongoing severe nausea and abdominal pain that seems to be getting worse. Jacque stated that the symptoms typically start up in the late afternoon. Pt was scheduled to see Alan Coombs PA on 12/09/2023 at 8:40 AM. Jacque made aware. Jacque verbalized understanding with all questions answered.

## 2023-12-05 ENCOUNTER — Other Ambulatory Visit: Payer: Self-pay

## 2023-12-08 NOTE — Progress Notes (Unsigned)
 12/09/2023 Alm Brian Zavala [id number] 1950-09-29  Referring provider: Cloria Annabella CROME, DO Primary GI doctor: Dr. Charlanne  ASSESSMENT AND PLAN:  Nausea with associated epigastric pain, sweating, worse in late afternoon, not associated with food or meds, sporadic 1-2 x a week 2015 GES normal 04/2023 EGD normal esophagus, Z-line irregular, gastritis normal duodenum, Negative H pylori, metaplasia, barret's 10/2023 CTA without focal stenosis at SMA/celiac, unremarkable otherwise On pantoprazole  40 mg daily without help No weight loss Question gastroparesis versus constipation versus other -TSH profile -Random early AM Cortisol (If positive perform Cosyntropin testing) -BMP/calcium  - GES, gastroparesis diet given, can continue reglan  as needed -Aggressive Bowel regimen, had some improvement directly after the colonoscopy, trial of linzess 290 after bowel purge, consider motegrity - consider US  mesentary arteries but no weight loss and CTA with no focal stenosis  Constipation BM every other day, mild straining - Increase fiber/ water intake -Will add on Linzess 290, consider Motegrety  Type 2 diabetes Off ozempic, on insulin   Peripheral vascular disease status post right AKA On Plavix   CVA July 2024  with carotid artery stenosis  RA On Enbrel   Screening colonoscopy 04/21/2023 colonoscopy good bowel prep normal TI, unremarkable no repeat colonoscopy due to age  COPD Not on oxygen  Patient Care Team: Cloria Annabella CROME, DO as PCP - General (Geriatric Medicine)  HISTORY OF PRESENT ILLNESS: 73 y.o. male with a past medical history of hypertension, diabetes type 2, peripheral vascular disease status post right AKA, carotid artery stenosis with CVA in July on dual antiplatelet therapy, RA and others listed below presents for evaluation of AB pain and nausea.   Patient last seen in the office 01/20/2023 by Jessica Zier, PA for nausea.  Discussed the use of AI scribe software  for clinical note transcription with the patient, who gave verbal consent to proceed.  History of Present Illness   Brian Zavala is a 73 year old male with gastritis who presents with severe nausea and abdominal pain.  He experiences severe nausea and abdominal pain, primarily occurring in the late afternoon around 4 PM. These episodes are described as very severe and are accompanied by cold sweats and clamminess. The symptoms do not occur daily but have been increasing in frequency and intensity over time. The nausea is particularly distressing and is not consistently associated with food intake.  He has a history of gastritis, as identified in a previous endoscopy, which showed inflammation but was negative for H. pylori, metaplasia, or cancer. The endoscopy also revealed some changes in the esophagus, but no serious findings were reported. A colonoscopy performed earlier showed no significant findings.  His current medications include pantoprazole  40 mg, which has not alleviated the nausea. He also takes insulin , specifically Semglee  and Novolog , and has recently been prescribed Reglan , which he takes with his lunchtime meal, although it has not significantly improved his symptoms. He previously took Ozempic and Victoza  but has discontinued these medications.  Bowel movements occur at least every other day, sometimes requiring straining. He takes Senna twice daily to aid with bowel movements, which he believes has been somewhat helpful. No significant weight loss, vomiting, or changes in bowel habits such as black or bloody stools. Blood sugar levels are typically around 120 in the mornings and under 200 during episodes of nausea.  He primarily drinks water, occasionally consumes soda, and drinks orange juice in the mornings. He does not consume alcohol. His caregiver notes that he has not experienced vertigo, dizziness, vision  changes, or trouble swallowing associated with these episodes.         He  reports that he has been smoking cigarettes. He started smoking about 58 years ago. He has a 57.9 pack-year smoking history. He has never used smokeless tobacco. He reports that he does not drink alcohol and does not use drugs.  RELEVANT GI HISTORY, IMAGING AND LABS: Results   RADIOLOGY CT Angiography: Plaque on abdominal arteries, no focal stenosis (10/2023)  DIAGNOSTIC Endoscopy: Gastritis, negative for H. pylori, negative for metaplasia or malignancy, esophageal changes consistent with gastroesophageal reflux disease (GERD) (04/2023) Colonoscopy: Normal (04/2023)      CBC    Component Value Date/Time   WBC 6.8 10/29/2023 0301   RBC 4.97 10/29/2023 0301   HGB 13.1 10/29/2023 0301   HGB 14.9 05/02/2017 1408   HCT 40.3 10/29/2023 0301   HCT 43.3 05/02/2017 1408   PLT 264 10/29/2023 0301   PLT 235 05/02/2017 1408   MCV 81.1 10/29/2023 0301   MCV 85 05/02/2017 1408   MCH 26.4 10/29/2023 0301   MCHC 32.5 10/29/2023 0301   RDW 16.1 (H) 10/29/2023 0301   RDW 14.6 05/02/2017 1408   LYMPHSABS 1.1 08/18/2023 1814   LYMPHSABS 1.4 05/02/2017 1408   MONOABS 0.7 08/18/2023 1814   EOSABS 0.2 08/18/2023 1814   EOSABS 0.2 05/02/2017 1408   BASOSABS 0.1 08/18/2023 1814   BASOSABS 0.0 05/02/2017 1408   Recent Labs    04/21/23 0423 08/18/23 1814 10/26/23 2311 10/28/23 0348 10/29/23 0301  HGB 15.6 13.2 14.9 13.3 13.1    CMP     Component Value Date/Time   NA 136 10/30/2023 0208   NA 138 05/02/2017 1408   K 3.3 (L) 10/30/2023 0208   CL 102 10/30/2023 0208   CO2 25 10/30/2023 0208   GLUCOSE 117 (H) 10/30/2023 0208   BUN 8 10/30/2023 0208   BUN 8 05/02/2017 1408   CREATININE 0.55 (L) 10/30/2023 0208   CREATININE 0.84 12/04/2017 1446   CALCIUM  8.1 (L) 10/30/2023 0208   CALCIUM  8.6 05/29/2023 0000   PROT 6.0 (L) 10/29/2023 0301   PROT 6.8 08/22/2022 1326   ALBUMIN 2.3 (L) 10/29/2023 0301   ALBUMIN 4.0 08/22/2022 1326   AST 20 10/29/2023 0301   ALT 16 10/29/2023  0301   ALKPHOS 64 10/29/2023 0301   BILITOT 0.6 10/29/2023 0301   BILITOT 0.4 08/22/2022 1326   GFRNONAA >60 10/30/2023 0208   GFRAA >60 11/27/2018 0931      Latest Ref Rng & Units 10/29/2023    3:01 AM 10/26/2023   11:11 PM 08/18/2023    6:14 PM  Hepatic Function  Total Protein 6.5 - 8.1 g/dL 6.0  7.6  7.0   Albumin 3.5 - 5.0 g/dL 2.3  3.0  2.9   AST 15 - 41 U/L 20  20  19    ALT 0 - 44 U/L 16  18  19    Alk Phosphatase 38 - 126 U/L 64  83  80   Total Bilirubin 0.0 - 1.2 mg/dL 0.6  1.0  0.4       Current Medications:   Current Outpatient Medications (Endocrine & Metabolic):    Glucagon  (BAQSIMI  ONE PACK) 3 MG/DOSE POWD, Place 1 Device into the nose as needed (Low blood sugar with impaired consciousness).   insulin  aspart (NOVOLOG ) 100 UNIT/ML injection, Inject 2-12 Units into the skin 4 (four) times daily -  before meals and at bedtime. Inject 2-12 units subcutaneously based on sliding scale:  131-180   2 units 181-240   4 units 241-300   6 units 301-350   8 units 351-400   10 units >400        12 units   insulin  glargine (SEMGLEE ) 100 UNIT/ML injection, Inject 0.7 mLs (70 Units total) into the skin daily. (Patient taking differently: Inject 72 Units into the skin in the morning.)   metFORMIN  (GLUCOPHAGE -XR) 500 MG 24 hr tablet, Take 1,000 mg by mouth 2 (two) times daily with a meal.   predniSONE  (DELTASONE ) 1 MG tablet, Take 1 mg by mouth 2 (two) times daily with a meal.  Current Outpatient Medications (Cardiovascular):    atorvastatin  (LIPITOR ) 80 MG tablet, Take 1 tablet (80 mg total) by mouth daily at 6 PM.   hydrochlorothiazide  (MICROZIDE ) 12.5 MG capsule, Take 12.5 mg by mouth daily.   metoprolol  succinate (TOPROL -XL) 25 MG 24 hr tablet, Take 25 mg by mouth at bedtime.  Current Outpatient Medications (Respiratory):    ipratropium (ATROVENT) 0.06 % nasal spray, Place 2 sprays into both nostrils as needed for rhinitis.   promethazine  (PHENERGAN ) 25 MG tablet, Take 25 mg by mouth  every 8 (eight) hours as needed for nausea or vomiting.  Current Outpatient Medications (Analgesics):    aspirin  81 MG chewable tablet, Chew 81 mg by mouth daily.   etanercept  (ENBREL ) 50 MG/ML injection, Inject 50 mg into the skin once a week.   leflunomide  (ARAVA ) 20 MG tablet, Take 20 mg by mouth daily.   morphine  (MS CONTIN ) 100 MG 12 hr tablet, Take 100 mg by mouth every 12 (twelve) hours.  Current Outpatient Medications (Hematological):    clopidogrel  (PLAVIX ) 75 MG tablet, Take 1 tablet (75 mg total) by mouth daily.   vitamin B-12 (CYANOCOBALAMIN ) 100 MCG tablet, Take 100 mcg by mouth daily.  Current Outpatient Medications (Other):    calcium  carbonate (OSCAL) 1500 (600 Ca) MG TABS tablet, Take 600 mg of elemental calcium  by mouth daily with breakfast.    metoCLOPramide  (REGLAN ) 5 MG tablet, Take 5 mg by mouth 4 (four) times daily.   ondansetron  (ZOFRAN ) 4 MG tablet, Take 1 tablet (4 mg total) by mouth every 6 (six) hours.   pantoprazole  (PROTONIX ) 40 MG tablet, Take 1 tablet (40 mg total) by mouth daily before breakfast.   pregabalin  (LYRICA ) 150 MG capsule, Take 150 mg by mouth 2 (two) times daily.   Sodium Fluoride (PREVIDENT 5000 BOOSTER PLUS DT), Place 1 application  onto teeth every evening.   triamcinolone cream (KENALOG) 0.1 %, Apply 1 Application topically daily as needed (Rash).  Medical History:  Past Medical History:  Diagnosis Date   Arthritis    pretty much all over   Cellulitis of left foot    Colitis    COPD (chronic obstructive pulmonary disease) (HCC)    Dyspnea    Hx of BKA, right (HCC)    Hyperlipidemia    Hypertension    Nausea and vomiting 08/20/2022   Neuromuscular disorder (HCC)    neuropathy legs   Neuropathy    legs   Osteomyelitis (HCC) 04/2018   4th toe left foot   Osteomyelitis of fourth toe of left foot (HCC) 04/19/2018   Peripheral vascular disease    Rheumatoid arthritis (HCC)    Sleep apnea    does not use a cpap   Stroke (HCC)     Type II diabetes mellitus (HCC)    Wears glasses    Wears partial dentures    top and bottom partials  Allergies:  Allergies  Allergen Reactions   Varenicline Nausea Only     Surgical History:  He  has a past surgical history that includes Leg amputation below knee (Right, 2005); Lumbar disc surgery (82,90); Toe amputation (Right, 2005); Foot Amputation (Right, 2005); Scar revision (Right, 2005); Cyst excision perineal (N/A, 12/08/2012); Cholecystectomy (N/A, 10/26/2013); Incise and drain abcess (12/02/2014); Back surgery; Incision and drainage perirectal abscess (Left, 12/02/2014); Cyst excision (Bilateral, 02/15/2015); Colonoscopy; I & D extremity (Right, 03/07/2017); Lower Extremity Angiography (N/A, 05/05/2017); PERIPHERAL VASCULAR INTERVENTION (Right, 05/05/2017); Amputation (Left, 04/22/2018); Stump revision (Right, 08/21/2018); Stump revision (Right, 09/09/2018); ABDOMINAL AORTOGRAM W/LOWER EXTREMITY (Bilateral, 10/19/2018); PERIPHERAL VASCULAR INTERVENTION (Right, 10/19/2018); Above knee leg amputaton (Right, 11/27/2018); Amputation (Right, 11/27/2018); Transcarotid artery revascularization (Left, 09/04/2022); Ultrasound guidance for vascular access (Right, 09/04/2022); Esophagogastroduodenoscopy (egd) with propofol  (N/A, 04/21/2023); Colonoscopy with propofol  (N/A, 04/21/2023); and biopsy (04/21/2023). Family History:  His family history includes Cirrhosis in his father; Diabetes in his maternal grandmother and mother; Hypothyroidism in his mother; Thyroid disease in his daughter.  REVIEW OF SYSTEMS  : All other systems reviewed and negative except where noted in the History of Present Illness.  PHYSICAL EXAM: BP 112/68   Pulse 89   Ht 6' 1 (1.854 m)   BMI 22.03 kg/m  Physical Exam   GENERAL APPEARANCE: chronically ill appearing, in no apparent distress. HEENT: No cervical lymphadenopathy, unremarkable thyroid, sclerae anicteric, conjunctiva pink, poor dentition RESPIRATORY: Respiratory effort  normal, rhonchi/wheezing bilateral lungs CARDIO: Regular rate and rhythm with no murmurs, rubs, or gallops, peripheral pulses intact. ABDOMEN: Soft, non-distended,obese, hypoactive bowel sounds in all four quadrants, non-tender to palpation, no rebound, no mass appreciated. RECTAL: Declines. MUSCULOSKELETAL: s/p right AKA, in wheelchair, atrophy present globally SKIN: Dry, intact without rashes or lesions. No jaundice. NEURO: Alert, oriented, no focal deficits. PSYCH: Cooperative, normal mood and affect.      Brian JONELLE Coombs, PA-C 9:56 AM

## 2023-12-09 ENCOUNTER — Ambulatory Visit: Payer: Medicare (Managed Care) | Admitting: Physician Assistant

## 2023-12-09 ENCOUNTER — Ambulatory Visit: Payer: Self-pay | Admitting: Physician Assistant

## 2023-12-09 ENCOUNTER — Encounter: Payer: Self-pay | Admitting: Physician Assistant

## 2023-12-09 ENCOUNTER — Other Ambulatory Visit: Payer: Medicare (Managed Care)

## 2023-12-09 VITALS — BP 112/68 | HR 89 | Ht 73.0 in

## 2023-12-09 DIAGNOSIS — R1033 Periumbilical pain: Secondary | ICD-10-CM

## 2023-12-09 DIAGNOSIS — R1013 Epigastric pain: Secondary | ICD-10-CM

## 2023-12-09 DIAGNOSIS — K5900 Constipation, unspecified: Secondary | ICD-10-CM

## 2023-12-09 DIAGNOSIS — R110 Nausea: Secondary | ICD-10-CM | POA: Diagnosis not present

## 2023-12-09 DIAGNOSIS — R748 Abnormal levels of other serum enzymes: Secondary | ICD-10-CM

## 2023-12-09 DIAGNOSIS — I6340 Cerebral infarction due to embolism of unspecified cerebral artery: Secondary | ICD-10-CM

## 2023-12-09 DIAGNOSIS — D649 Anemia, unspecified: Secondary | ICD-10-CM

## 2023-12-09 DIAGNOSIS — I70229 Atherosclerosis of native arteries of extremities with rest pain, unspecified extremity: Secondary | ICD-10-CM

## 2023-12-09 LAB — VITAMIN B12: Vitamin B-12: 411 pg/mL (ref 211–911)

## 2023-12-09 LAB — CBC WITH DIFFERENTIAL/PLATELET
Basophils Absolute: 0.1 K/uL (ref 0.0–0.1)
Basophils Relative: 0.7 % (ref 0.0–3.0)
Eosinophils Absolute: 0.1 K/uL (ref 0.0–0.7)
Eosinophils Relative: 1.2 % (ref 0.0–5.0)
HCT: 36.5 % — ABNORMAL LOW (ref 39.0–52.0)
Hemoglobin: 12 g/dL — ABNORMAL LOW (ref 13.0–17.0)
Lymphocytes Relative: 7.7 % — ABNORMAL LOW (ref 12.0–46.0)
Lymphs Abs: 0.7 K/uL (ref 0.7–4.0)
MCHC: 33 g/dL (ref 30.0–36.0)
MCV: 78.9 fl (ref 78.0–100.0)
Monocytes Absolute: 0.7 K/uL (ref 0.1–1.0)
Monocytes Relative: 7.3 % (ref 3.0–12.0)
Neutro Abs: 7.5 K/uL (ref 1.4–7.7)
Neutrophils Relative %: 83.1 % — ABNORMAL HIGH (ref 43.0–77.0)
Platelets: 390 K/uL (ref 150.0–400.0)
RBC: 4.62 Mil/uL (ref 4.22–5.81)
RDW: 16.6 % — ABNORMAL HIGH (ref 11.5–15.5)
WBC: 9 K/uL (ref 4.0–10.5)

## 2023-12-09 LAB — LIPASE: Lipase: 6 U/L — ABNORMAL LOW (ref 11.0–59.0)

## 2023-12-09 LAB — COMPREHENSIVE METABOLIC PANEL WITH GFR
ALT: 30 U/L (ref 0–53)
AST: 26 U/L (ref 0–37)
Albumin: 3.1 g/dL — ABNORMAL LOW (ref 3.5–5.2)
Alkaline Phosphatase: 202 U/L — ABNORMAL HIGH (ref 39–117)
BUN: 9 mg/dL (ref 6–23)
CO2: 33 meq/L — ABNORMAL HIGH (ref 19–32)
Calcium: 8.8 mg/dL (ref 8.4–10.5)
Chloride: 94 meq/L — ABNORMAL LOW (ref 96–112)
Creatinine, Ser: 0.68 mg/dL (ref 0.40–1.50)
GFR: 92.6 mL/min (ref >60.00)
Glucose, Bld: 258 mg/dL — ABNORMAL HIGH (ref 70–99)
Potassium: 5.4 meq/L — ABNORMAL HIGH (ref 3.5–5.1)
Sodium: 135 meq/L (ref 135–145)
Total Bilirubin: 0.6 mg/dL (ref 0.2–1.2)
Total Protein: 6.5 g/dL (ref 6.0–8.3)

## 2023-12-09 LAB — GAMMA GT: GGT: 108 U/L — ABNORMAL HIGH (ref 7–51)

## 2023-12-09 LAB — TSH: TSH: 0.87 u[IU]/mL (ref 0.35–5.50)

## 2023-12-09 LAB — SEDIMENTATION RATE: Sed Rate: 60 mm/h — ABNORMAL HIGH (ref 0–20)

## 2023-12-09 LAB — IBC + FERRITIN
Ferritin: 81.7 ng/mL (ref 22.0–322.0)
Iron: 44 ug/dL (ref 42–165)
Saturation Ratios: 15.5 % — ABNORMAL LOW (ref 20.0–50.0)
TIBC: 284.2 ug/dL (ref 250.0–450.0)
Transferrin: 203 mg/dL — ABNORMAL LOW (ref 212.0–360.0)

## 2023-12-09 LAB — CORTISOL: Cortisol, Plasma: 8.6 ug/dL

## 2023-12-09 NOTE — Patient Instructions (Signed)
 Your provider has requested that you go to the basement level for lab work before leaving today. Press B on the elevator. The lab is located at the first door on the left as you exit the elevator.  Please do the following: Purchase a bottle of Miralax  over the counter as well as a box of 5 mg dulcolax tablets. Take 4 dulcolax tablets. Wait 1 hour. You will then drink 6-8 capfuls of Miralax  mixed in an adequate amount of water/juice/gatorade (you may choose which of these liquids to drink) over the next 2-3 hours. You should expect results within 1 to 6 hours after completing the bowel purge. Go to the er if you have severe AB pain, can not pass gas or stool in over 12 hours, can not hold down any food.    Linzess 290 mcg, do the senokot AS needed *IBS-C patients may begin to experience relief from belly pain and overall abdominal symptoms (pain, discomfort, and bloating) in about 1 week,  with symptoms typically improving over 12 weeks.  Take at least 30 minutes before the first meal of the day on an empty stomach You can have a loose stool if you eat a high-fat breakfast. Give it at least 7 days, may have more bowel movements during that time.   The diarrhea should go away and you should start having normal, complete, full bowel movements.  It may be helpful to start treatment when you can be near the comfort of your own bathroom, such as a weekend.  After you are out we can send in a prescription if you did well, there is a prescription card  Toileting tips to help with your constipation - Drink at least 64-80 ounces of water/liquid per day. - Establish a time to try to move your bowels every day.  For many people, this is after a cup of coffee or after a meal such as breakfast. - Sit all of the way back on the toilet keeping your back fairly straight and while sitting up, try to rest the tops of your forearms on your upper thighs.   - Raising your feet with a step stool/squatty potty  can be helpful to improve the angle that allows your stool to pass through the rectum. - Relax the rectum feeling it bulge toward the toilet water.  If you feel your rectum raising toward your body, you are contracting rather than relaxing. - Breathe in and slowly exhale. Belly breath by expanding your belly towards your belly button. Keep belly expanded as you gently direct pressure down and back to the anus.  A low pitched GRRR sound can assist with increasing intra-abdominal pressure.  (Can also trying to blow on a pinwheel and make it move, this helps with the same belly breathing) - Repeat 3-4 times. If unsuccessful, contract the pelvic floor to restore normal tone and get off the toilet.  Avoid excessive straining. - To reduce excessive wiping by teaching your anus to normally contract, place hands on outer aspect of knees and resist knee movement outward.  Hold 5-10 second then place hands just inside of knees and resist inward movement of knees.  Hold 5 seconds.  Repeat a few times each way.  Go to the ER if unable to pass gas, severe AB pain, unable to hold down food, any shortness of breath of chest pain.  Gastroparesis Gastroparesis is a condition in which food takes longer than normal to empty from the stomach.  This condition is also  known as delayed gastric emptying. It is usually a long-term (chronic) condition.  What are the signs or symptoms? Symptoms of this condition include: Feeling full after eating very little or a loss of appetite. Nausea, vomiting, or heartburn. Bloating of your abdomen. Inconsistent blood sugar (glucose) levels on blood tests. Unexplained weight loss. Acid from the stomach coming up into the esophagus (gastroesophageal reflux). Sudden tightening (spasm) of the stomach, which can be painful. Symptoms may come and go. Some people may not notice any symptoms.  What increases the risk? You are more likely to develop this condition if: You have certain  disorders or diseases. These may include: An endocrine disorder. An eating disorder. Amyloidosis. Scleroderma. Parkinson's disease. Multiple sclerosis. Cancer or infection of the stomach or the vagus nerve. You have had surgery on your stomach or vagus nerve. You take certain medicines. You are male.  Things you can do: Please do small frequent meals like 4-6 meals a day.  Eat and drink liquids at separate times.  Avoid high fiber foods, cook your vegetables, avoid high fat food.  Suggest spreading protein throughout the day (greek yogurt, glucerna, soft meat, milk, eggs) Choose soft foods that you can mash with a fork When you are more symptomatic, change to pureed foods foods and liquids.  Consider reading Living well with Gastroparesis by [facility] Check out this link to a diet online https://my.GroupJournal.fr  Reglan  or metoclopramide   Can be used for gastroparesis or slow stomach, nausea, vomiting, GERD.   Continue the medication as needed up to 2 times a day, on an empty stomach 30 minutes before eating. It may take a few weeks for your stomach condition to start to get better. However, do not take this medicine for longer than 12 weeks.  The longer you take this medicine, and the more you take it, the greater your chances are of developing serious side effects.  Some people may get a severe muscle poblem called tardive dyskinesia. This problem may lessen or go away after stopping this drug, but it may not go away. The risk is greater with diabetes and in older adults, especially older females. The risk is greater with longer use or higher doses, but it may also occur after short-term use with low doses. Call your doctor right away if you have trouble controlling body movements or problems with your tongue, face, mouth, or jaw like tongue sticking out, puffing cheeks, mouth  puckering, or chewing.   Please monitor for worsening depression or thoughts of suicide, any aggressiveness or hyperactivity.  If this happen please stop and call your physician right away.  You have been scheduled for a gastric emptying scan at [facility] on 12/24/2023 at 7:00 am. Please arrive at least 30 minutes prior to your appointment for registration. Please make certain not to have anything to eat or drink after midnight the night before your test. Hold all stomach medications (ex: Zofran , phenergan , Reglan ) 24 hours prior to your test. If you need to reschedule your appointment, please contact radiology scheduling at [phone]. _____________________________________________________________________ A gastric-emptying study measures how long it takes for food to move through your stomach. There are several ways to measure stomach emptying. In the most common test, you eat food that contains a small amount of radioactive material. A scanner that detects the movement of the radioactive material is placed over your abdomen to monitor the rate at which food leaves your stomach. This test normally takes about 4 hours to complete. _____________________________________________________________________  Due to recent changes in healthcare laws, you may see the results of your imaging and laboratory studies on MyChart before your provider has had a chance to review them.  We understand that in some cases there may be results that are confusing or concerning to you. Not all laboratory results come back in the same time frame and the provider may be waiting for multiple results in order to interpret others.  Please give us  48 hours in order for your provider to thoroughly review all the results before contacting the office for clarification of your results.    I appreciate the  opportunity to care for you  Thank You   [facility]

## 2023-12-11 ENCOUNTER — Ambulatory Visit: Payer: Self-pay | Admitting: Physician Assistant

## 2023-12-12 ENCOUNTER — Ambulatory Visit: Payer: Medicare (Managed Care) | Attending: Vascular Surgery | Admitting: Physician Assistant

## 2023-12-12 VITALS — BP 137/84 | HR 106 | Temp 97.7°F

## 2023-12-12 DIAGNOSIS — I70245 Atherosclerosis of native arteries of left leg with ulceration of other part of foot: Secondary | ICD-10-CM

## 2023-12-12 NOTE — Progress Notes (Signed)
 Office Note   History of Present Illness   Brian Zavala is a 73 y.o. (1950/07/03) male who presents for wound check.  He has history of bilateral carotid artery stenosis and has undergone left TCAR on 09/04/2022.  He also has an extensive history of PAD and has undergone right above-knee amputation.  At his last follow-up with Dr. Serene it was noted that he had a new ulceration on his left second toe.  He was set up for angiography in the outpatient setting.  He returns today for repeat wound check.  Unfortunately his angiogram was canceled a few weeks ago because he was sick.  This has now been rescheduled for 10/21.  He says that his ulcer on the left second toe remains the same.  This does not cause him any pain.  He has also developed a small wound on his left great toe after his toenail fell off.  He denies any rest pain in the left foot.  He denies any fevers or chills.  His facility is painting his toes with Betadine  daily to keep them dry.  Current Outpatient Medications  Medication Sig Dispense Refill   aspirin  81 MG chewable tablet Chew 81 mg by mouth daily.     atorvastatin  (LIPITOR ) 80 MG tablet Take 1 tablet (80 mg total) by mouth daily at 6 PM. 30 tablet 2   calcium  carbonate (OSCAL) 1500 (600 Ca) MG TABS tablet Take 600 mg of elemental calcium  by mouth daily with breakfast.      clopidogrel  (PLAVIX ) 75 MG tablet Take 1 tablet (75 mg total) by mouth daily. 30 tablet 0   etanercept  (ENBREL ) 50 MG/ML injection Inject 50 mg into the skin once a week.     Glucagon  (BAQSIMI  ONE PACK) 3 MG/DOSE POWD Place 1 Device into the nose as needed (Low blood sugar with impaired consciousness). 2 each 3   hydrochlorothiazide  (MICROZIDE ) 12.5 MG capsule Take 12.5 mg by mouth daily.     insulin  aspart (NOVOLOG ) 100 UNIT/ML injection Inject 2-12 Units into the skin 4 (four) times daily -  before meals and at bedtime. Inject 2-12 units subcutaneously based on sliding scale:  131-180   2  units 181-240   4 units 241-300   6 units 301-350   8 units 351-400   10 units >400        12 units     insulin  glargine (SEMGLEE ) 100 UNIT/ML injection Inject 0.7 mLs (70 Units total) into the skin daily. (Patient taking differently: Inject 72 Units into the skin in the morning.) 63 mL 1   ipratropium (ATROVENT) 0.06 % nasal spray Place 2 sprays into both nostrils as needed for rhinitis.     leflunomide  (ARAVA ) 20 MG tablet Take 20 mg by mouth daily.     metFORMIN  (GLUCOPHAGE -XR) 500 MG 24 hr tablet Take 1,000 mg by mouth 2 (two) times daily with a meal.     metoCLOPramide  (REGLAN ) 5 MG tablet Take 5 mg by mouth 4 (four) times daily.     metoprolol  succinate (TOPROL -XL) 25 MG 24 hr tablet Take 25 mg by mouth at bedtime.     morphine  (MS CONTIN ) 100 MG 12 hr tablet Take 100 mg by mouth every 12 (twelve) hours.     ondansetron  (ZOFRAN ) 4 MG tablet Take 1 tablet (4 mg total) by mouth every 6 (six) hours. 12 tablet 0   pantoprazole  (PROTONIX ) 40 MG tablet Take 1 tablet (40 mg total) by mouth daily before  breakfast. 30 tablet 3   predniSONE  (DELTASONE ) 1 MG tablet Take 1 mg by mouth 2 (two) times daily with a meal.     pregabalin  (LYRICA ) 150 MG capsule Take 150 mg by mouth 2 (two) times daily.     promethazine  (PHENERGAN ) 25 MG tablet Take 25 mg by mouth every 8 (eight) hours as needed for nausea or vomiting.     Sodium Fluoride (PREVIDENT 5000 BOOSTER PLUS DT) Place 1 application  onto teeth every evening.     triamcinolone cream (KENALOG) 0.1 % Apply 1 Application topically daily as needed (Rash).     vitamin B-12 (CYANOCOBALAMIN ) 100 MCG tablet Take 100 mcg by mouth daily.     No current facility-administered medications for this visit.    REVIEW OF SYSTEMS (negative unless checked):   Cardiac:  []  Chest pain or chest pressure? []  Shortness of breath upon activity? []  Shortness of breath when lying flat? []  Irregular heart rhythm?  Vascular:  []  Pain in calf, thigh, or hip brought  on by walking? []  Pain in feet at night that wakes you up from your sleep? []  Blood clot in your veins? []  Leg swelling?  Pulmonary:  []  Oxygen at home? []  Productive cough? []  Wheezing?  Neurologic:  []  Sudden weakness in arms or legs? []  Sudden numbness in arms or legs? []  Sudden onset of difficult speaking or slurred speech? []  Temporary loss of vision in one eye? []  Problems with dizziness?  Gastrointestinal:  []  Blood in stool? []  Vomited blood?  Genitourinary:  []  Burning when urinating? []  Blood in urine?  Psychiatric:  []  Major depression  Hematologic:  []  Bleeding problems? []  Problems with blood clotting?  Dermatologic:  [x]  Rashes or ulcers?  Constitutional:  []  Fever or chills?  Ear/Nose/Throat:  []  Change in hearing? []  Nose bleeds? []  Sore throat?  Musculoskeletal:  []  Back pain? []  Joint pain? []  Muscle pain?   Physical Examination   Vitals:   12/12/23 1444  BP: 137/84  Pulse: (!) 106  Temp: 97.7 F (36.5 C)  TempSrc: Temporal   There is no height or weight on file to calculate BMI.  General: Chronically ill-appearing, in wheelchair Gait: Not observed HENT: WNL, normocephalic Pulmonary: normal non-labored breathing  Cardiac: Regular Abdomen: soft, NT, no masses Skin: without rashes Vascular Exam/Pulses: Intact left PT/DP Doppler signals Extremities: Well-healed right AKA.  Stable appearance of the left second toe ulceration without signs of infection.  Small open wound of tip of left great toe Musculoskeletal: no muscle wasting or atrophy  Neurologic: A&O X 3;  No focal weakness or paresthesias are detected Psychiatric:  The pt has Normal affect.        Medical Decision Making   Brian Zavala is a 73 y.o. male who presents for wound check  The patient was last seen at our office on 9/22 for surveillance of his carotid artery stenosis.  At that time it was noted that he had a new, dry ulceration to his left second  toe.  He was set up for outpatient angiography, however he unfortunately had to cancel this because he was sick.  This has been rescheduled for 10/21 He presents today for repeat wound check.  He says that his left second toe ulceration has stayed the same.  He denies any drainage from this area.  He has also developed a small wound on the tip of his left great toe after his toenail fell off.  His facility is painting his toes  with Betadine  to keep the ulcers dry. On exam he has nonpalpable left pedal pulses.  He has intact left DP/PT Doppler signals.  He has a small open wound to the tip of the left great toe and a dry, unchanged ulcer to the left second toe The patient would still benefit from left lower extremity angiography with attempted revascularization to improve his blood flow and help him heal his ulcers.  He will keep his currently scheduled angiogram on 10/21  [facility] Office: [phone]  Clinic MD: Pearline

## 2023-12-12 NOTE — H&P (View-Only) (Signed)
 Office Note   History of Present Illness   Brian Zavala is a 73 y.o. (1950/07/03) male who presents for wound check.  He has history of bilateral carotid artery stenosis and has undergone left TCAR on 09/04/2022.  He also has an extensive history of PAD and has undergone right above-knee amputation.  At his last follow-up with Dr. Serene it was noted that he had a new ulceration on his left second toe.  He was set up for angiography in the outpatient setting.  He returns today for repeat wound check.  Unfortunately his angiogram was canceled a few weeks ago because he was sick.  This has now been rescheduled for 10/21.  He says that his ulcer on the left second toe remains the same.  This does not cause him any pain.  He has also developed a small wound on his left great toe after his toenail fell off.  He denies any rest pain in the left foot.  He denies any fevers or chills.  His facility is painting his toes with Betadine  daily to keep them dry.  Current Outpatient Medications  Medication Sig Dispense Refill   aspirin  81 MG chewable tablet Chew 81 mg by mouth daily.     atorvastatin  (LIPITOR ) 80 MG tablet Take 1 tablet (80 mg total) by mouth daily at 6 PM. 30 tablet 2   calcium  carbonate (OSCAL) 1500 (600 Ca) MG TABS tablet Take 600 mg of elemental calcium  by mouth daily with breakfast.      clopidogrel  (PLAVIX ) 75 MG tablet Take 1 tablet (75 mg total) by mouth daily. 30 tablet 0   etanercept  (ENBREL ) 50 MG/ML injection Inject 50 mg into the skin once a week.     Glucagon  (BAQSIMI  ONE PACK) 3 MG/DOSE POWD Place 1 Device into the nose as needed (Low blood sugar with impaired consciousness). 2 each 3   hydrochlorothiazide  (MICROZIDE ) 12.5 MG capsule Take 12.5 mg by mouth daily.     insulin  aspart (NOVOLOG ) 100 UNIT/ML injection Inject 2-12 Units into the skin 4 (four) times daily -  before meals and at bedtime. Inject 2-12 units subcutaneously based on sliding scale:  131-180   2  units 181-240   4 units 241-300   6 units 301-350   8 units 351-400   10 units >400        12 units     insulin  glargine (SEMGLEE ) 100 UNIT/ML injection Inject 0.7 mLs (70 Units total) into the skin daily. (Patient taking differently: Inject 72 Units into the skin in the morning.) 63 mL 1   ipratropium (ATROVENT) 0.06 % nasal spray Place 2 sprays into both nostrils as needed for rhinitis.     leflunomide  (ARAVA ) 20 MG tablet Take 20 mg by mouth daily.     metFORMIN  (GLUCOPHAGE -XR) 500 MG 24 hr tablet Take 1,000 mg by mouth 2 (two) times daily with a meal.     metoCLOPramide  (REGLAN ) 5 MG tablet Take 5 mg by mouth 4 (four) times daily.     metoprolol  succinate (TOPROL -XL) 25 MG 24 hr tablet Take 25 mg by mouth at bedtime.     morphine  (MS CONTIN ) 100 MG 12 hr tablet Take 100 mg by mouth every 12 (twelve) hours.     ondansetron  (ZOFRAN ) 4 MG tablet Take 1 tablet (4 mg total) by mouth every 6 (six) hours. 12 tablet 0   pantoprazole  (PROTONIX ) 40 MG tablet Take 1 tablet (40 mg total) by mouth daily before  breakfast. 30 tablet 3   predniSONE  (DELTASONE ) 1 MG tablet Take 1 mg by mouth 2 (two) times daily with a meal.     pregabalin  (LYRICA ) 150 MG capsule Take 150 mg by mouth 2 (two) times daily.     promethazine  (PHENERGAN ) 25 MG tablet Take 25 mg by mouth every 8 (eight) hours as needed for nausea or vomiting.     Sodium Fluoride (PREVIDENT 5000 BOOSTER PLUS DT) Place 1 application  onto teeth every evening.     triamcinolone cream (KENALOG) 0.1 % Apply 1 Application topically daily as needed (Rash).     vitamin B-12 (CYANOCOBALAMIN ) 100 MCG tablet Take 100 mcg by mouth daily.     No current facility-administered medications for this visit.    REVIEW OF SYSTEMS (negative unless checked):   Cardiac:  []  Chest pain or chest pressure? []  Shortness of breath upon activity? []  Shortness of breath when lying flat? []  Irregular heart rhythm?  Vascular:  []  Pain in calf, thigh, or hip brought  on by walking? []  Pain in feet at night that wakes you up from your sleep? []  Blood clot in your veins? []  Leg swelling?  Pulmonary:  []  Oxygen at home? []  Productive cough? []  Wheezing?  Neurologic:  []  Sudden weakness in arms or legs? []  Sudden numbness in arms or legs? []  Sudden onset of difficult speaking or slurred speech? []  Temporary loss of vision in one eye? []  Problems with dizziness?  Gastrointestinal:  []  Blood in stool? []  Vomited blood?  Genitourinary:  []  Burning when urinating? []  Blood in urine?  Psychiatric:  []  Major depression  Hematologic:  []  Bleeding problems? []  Problems with blood clotting?  Dermatologic:  [x]  Rashes or ulcers?  Constitutional:  []  Fever or chills?  Ear/Nose/Throat:  []  Change in hearing? []  Nose bleeds? []  Sore throat?  Musculoskeletal:  []  Back pain? []  Joint pain? []  Muscle pain?   Physical Examination   Vitals:   12/12/23 1444  BP: 137/84  Pulse: (!) 106  Temp: 97.7 F (36.5 C)  TempSrc: Temporal   There is no height or weight on file to calculate BMI.  General: Chronically ill-appearing, in wheelchair Gait: Not observed HENT: WNL, normocephalic Pulmonary: normal non-labored breathing  Cardiac: Regular Abdomen: soft, NT, no masses Skin: without rashes Vascular Exam/Pulses: Intact left PT/DP Doppler signals Extremities: Well-healed right AKA.  Stable appearance of the left second toe ulceration without signs of infection.  Small open wound of tip of left great toe Musculoskeletal: no muscle wasting or atrophy  Neurologic: A&O X 3;  No focal weakness or paresthesias are detected Psychiatric:  The pt has Normal affect.        Medical Decision Making   Brian Zavala is a 73 y.o. male who presents for wound check  The patient was last seen at our office on 9/22 for surveillance of his carotid artery stenosis.  At that time it was noted that he had a new, dry ulceration to his left second  toe.  He was set up for outpatient angiography, however he unfortunately had to cancel this because he was sick.  This has been rescheduled for 10/21 He presents today for repeat wound check.  He says that his left second toe ulceration has stayed the same.  He denies any drainage from this area.  He has also developed a small wound on the tip of his left great toe after his toenail fell off.  His facility is painting his toes  with Betadine  to keep the ulcers dry. On exam he has nonpalpable left pedal pulses.  He has intact left DP/PT Doppler signals.  He has a small open wound to the tip of the left great toe and a dry, unchanged ulcer to the left second toe The patient would still benefit from left lower extremity angiography with attempted revascularization to improve his blood flow and help him heal his ulcers.  He will keep his currently scheduled angiogram on 10/21  [facility] PA[facility] [place] Office: [phone]  Clinic MD: Pearline

## 2023-12-18 ENCOUNTER — Ambulatory Visit (INDEPENDENT_AMBULATORY_CARE_PROVIDER_SITE_OTHER): Payer: Medicare (Managed Care) | Admitting: Podiatry

## 2023-12-18 ENCOUNTER — Encounter: Payer: Self-pay | Admitting: Podiatry

## 2023-12-18 DIAGNOSIS — I739 Peripheral vascular disease, unspecified: Secondary | ICD-10-CM | POA: Diagnosis not present

## 2023-12-18 DIAGNOSIS — L03119 Cellulitis of unspecified part of limb: Secondary | ICD-10-CM

## 2023-12-18 DIAGNOSIS — L97522 Non-pressure chronic ulcer of other part of left foot with fat layer exposed: Secondary | ICD-10-CM | POA: Diagnosis not present

## 2023-12-18 MED ORDER — CEPHALEXIN 500 MG PO CAPS: 500.0000 mg | ORAL_CAPSULE | Freq: Three times a day (TID) | ORAL | 0 refills | Status: AC

## 2023-12-18 NOTE — Patient Instructions (Signed)

## 2023-12-18 NOTE — Progress Notes (Signed)
 Chief Complaint  Patient presents with   Diabetic Ulcer    L ulcer Great and 2nd toe. Redness. Swelling. Some drainage. A1c 7.5 Plavix     HPI: 73 y.o. male presents today accompanied by sister who is a Engineer, civil (consulting) and assist with his care.  Follows up today for dorsal second toe wound and left first toe wound.  The left first toe wound seems to have worsened somewhat.  There is also some redness and swelling present.  He is scheduled to undergo aortogram next week with Dr. Serene.  They have been applying Betadine  to the 1st and 2nd toes.  Also has new abrasion to the left anterior leg.  History of right above-knee amputation.  Denies any nausea, vomiting, fever, chills, chest pain, shortness of breath.  Past Medical History:  Diagnosis Date   Arthritis    pretty much all over   Cellulitis of left foot    Colitis    COPD (chronic obstructive pulmonary disease) (HCC)    Dyspnea    Hx of BKA, right (HCC)    Hyperlipidemia    Hypertension    Nausea and vomiting 08/20/2022   Neuromuscular disorder (HCC)    neuropathy legs   Neuropathy    legs   Osteomyelitis (HCC) 04/2018   4th toe left foot   Osteomyelitis of fourth toe of left foot (HCC) 04/19/2018   Peripheral vascular disease    Rheumatoid arthritis (HCC)    Sleep apnea    does not use a cpap   Stroke (HCC)    Type II diabetes mellitus (HCC)    Wears glasses    Wears partial dentures    top and bottom partials    Past Surgical History:  Procedure Laterality Date   ABDOMINAL AORTOGRAM W/LOWER EXTREMITY Bilateral 10/19/2018   Procedure: ABDOMINAL AORTOGRAM W/LOWER EXTREMITY;  Surgeon: Court Dorn PARAS, MD;  Location: [facility];  Service: Cardiovascular;  Laterality: Bilateral;   ABOVE KNEE LEG AMPUTATION Right 11/27/2018   AMPUTATION Left 04/22/2018   Procedure: LEFT FOOT 4TH RAY AMPUTATION;  Surgeon: Harden Jerona GAILS, MD;  Location: [facility] OR;  Service: Orthopedics;  Laterality: Left;   AMPUTATION Right  11/27/2018   Procedure: RIGHT ABOVE KNEE AMPUTATION;  Surgeon: Harden Jerona GAILS, MD;  Location: [facility] OR;  Service: Orthopedics;  Laterality: Right;   BACK SURGERY     BIOPSY  04/21/2023   Procedure: BIOPSY;  Surgeon: Charlanne Groom, MD;  Location: [facility];  Service: Gastroenterology;;   CHOLECYSTECTOMY N/A 10/26/2013   Procedure: LAPAROSCOPIC CHOLECYSTECTOMY WITH INTRAOPERATIVE CHOLANGIOGRAM;  Surgeon: Debby LABOR. Cornett, MD;  Location: [facility];  Service: General;  Laterality: N/A;   COLONOSCOPY     COLONOSCOPY WITH PROPOFOL  N/A 04/21/2023   Procedure: COLONOSCOPY WITH PROPOFOL ;  Surgeon: Charlanne Groom, MD;  Location: [facility];  Service: Gastroenterology;  Laterality: N/A;   CYST EXCISION Bilateral 02/15/2015   Procedure: LEFT INDEX FINGER AND RIGHT MIDDLE FINGER NODULE EXCISION;  Surgeon: Kay CHRISTELLA Cummins, MD;  Location: [facility];  Service: Orthopedics;  Laterality: Bilateral;   CYST EXCISION PERINEAL N/A 12/08/2012   Procedure: CYST EXCISION PERINeum;  Surgeon: Debby A. Cornett, MD;  Location: [facility];  Service: General;  Laterality: N/A;   ESOPHAGOGASTRODUODENOSCOPY (EGD) WITH PROPOFOL  N/A 04/21/2023   Procedure: ESOPHAGOGASTRODUODENOSCOPY (EGD) WITH PROPOFOL ;  Surgeon: Charlanne Groom, MD;  Location: [facility];  Service: Gastroenterology;  Laterality: N/A;   FOOT AMPUTATION Right 2005   I & D  EXTREMITY Right 03/07/2017   Procedure: EXCISION FIBULAR HEAD RIGHT BELOW KNEE AMPUTATION;  Surgeon: Harden Jerona GAILS, MD;  Location: [facility] OR;  Service: Orthopedics;  Laterality: Right;   INCISE AND DRAIN ABCESS  12/02/2014   PERINEAL ABSCESS   INCISION AND DRAINAGE PERIRECTAL ABSCESS Left 12/02/2014   Procedure: IRRIGATION AND DEBRIDEMENT PERINEAL ABSCESS;  Surgeon: Vicenta Poli, MD;  Location: [facility];  Service: General;  Laterality: Left;   LEG AMPUTATION BELOW KNEE Right 2005   LOWER EXTREMITY ANGIOGRAPHY N/A 05/05/2017   Procedure: LOWER EXTREMITY ANGIOGRAPHY;  Surgeon:  Court Dorn PARAS, MD;  Location: [facility];  Service: Cardiovascular;  Laterality: N/A;   LUMBAR DISC SURGERY  82,90   ruptured disc   PERIPHERAL VASCULAR INTERVENTION Right 05/05/2017   Procedure: PERIPHERAL VASCULAR INTERVENTION;  Surgeon: Court Dorn PARAS, MD;  Location: [facility];  Service: Cardiovascular;  Laterality: Right;   PERIPHERAL VASCULAR INTERVENTION Right 10/19/2018   Procedure: PERIPHERAL VASCULAR INTERVENTION;  Surgeon: Court Dorn PARAS, MD;  Location: [facility];  Service: Cardiovascular;  Laterality: Right;   SCAR REVISION Right 2005   @ amputation   STUMP REVISION Right 08/21/2018   Procedure: REVISION RIGHT BELOW KNEE AMPUTATION, EXCISION FIBULA;  Surgeon: Harden Jerona GAILS, MD;  Location: [facility];  Service: Orthopedics;  Laterality: Right;   STUMP REVISION Right 09/09/2018   Procedure: REVISION RIGHT BELOW KNEE AMPUTATION;  Surgeon: Harden Jerona GAILS, MD;  Location: [facility] OR;  Service: Orthopedics;  Laterality: Right;   TOE AMPUTATION Right 2005   TRANSCAROTID ARTERY REVASCULARIZATION  Left 09/04/2022   Procedure: Left Transcarotid Artery Revascularization;  Surgeon: Serene Gaile ORN, MD;  Location: [facility] OR;  Service: Vascular;  Laterality: Left;   ULTRASOUND GUIDANCE FOR VASCULAR ACCESS Right 09/04/2022   Procedure: ULTRASOUND GUIDANCE FOR VASCULAR ACCESS, RIGHT FEMORAL VEIN;  Surgeon: Serene Gaile ORN, MD;  Location: [facility];  Service: Vascular;  Laterality: Right;    Allergies  Allergen Reactions   Chantix [Varenicline] Nausea Only    ROS denies any nausea, vomiting, fever, chills, chest pain, shortness of breath   Physical Exam: There were no vitals filed for this visit.  General: The patient is alert and oriented x3 in no acute distress.  Dermatology: Pedal skin thin and atrophic.  Left second toe dorsal PIPJ ulceration approximately 0.5 centers in diameter and is scabbed over today.  Left first toe at area with overlying hyperkeratotic tissue there is  evidence of tissue loss distal medial aspect.  Measures approximately 0.4 x 0.4 x 0.3 cm distal aspect with fat exposed.  Erythema present involving the forefoot.  Focal warmth increase.  Small amount of serous drainage present to the first toe.  Left anterior shin there is superficial abrasion with small amount of serous drainage with appearance consistent with deroofed blister.       Vascular: Left foot DP and PT pulses non palpable.  Cap refill approximately 3 seconds to the digits.  Pedal hair growth absent.   Neurological: Protective sensation decreased.  Musculoskeletal Exam: Prior left foot fourth toe amputation.  Right above-knee amputation.  Mild hammertoes of remaining digits left foot  Assessment/Plan of Care: 1. Toe ulcer, left, with fat layer exposed (HCC)   2. PAD (peripheral artery disease)   3. Cellulitis of foot      Meds ordered this encounter  Medications   cephALEXin  (KEFLEX ) 500 MG capsule    Sig: Take 1 capsule (500 mg total) by mouth 3 (three) times daily for  10 days.    Dispense:  30 capsule    Refill:  0   None  Discussed clinical findings with patient today.  Wound culture was obtained left first toe.  Starting empiric Keflex .  Continue to dress wounds with Betadine  wet-to-dry gauze dressings.  This was applied today.  Did also apply Xeroform to the anterior shin wound.  They can dress this with nonadherent gauze or with Xeroform.  Ordering outside x-ray left foot due to difficulty with positioning with wheelchair to see if there is any evidence of progression or osteomyelitis.  Monitor for signs of worsening infection.  If this persists despite antibiotics contact office or go to emergency room.  Will await results of the abdominal aortogram as well.  Follow-up in 1 to 2 weeks.  Surgical shoe dispensed the patient today to protect the end of the first toe and dorsal second toe from excessive friction.    Evann Erazo L. Lamount MAUL, AACFAS [facility]     [address] [place], [place] [address]                Office [phone]  Fax [phone]

## 2023-12-21 LAB — WOUND CULTURE
MICRO NUMBER:: 17108687
SPECIMEN QUALITY:: ADEQUATE

## 2023-12-21 NOTE — Addendum Note (Signed)
 Addended by: LAMOUNT ETHAN CROME on: 12/21/2023 09:12 PM   Modules accepted: Orders

## 2023-12-22 ENCOUNTER — Ambulatory Visit: Payer: Self-pay | Admitting: Podiatry

## 2023-12-23 ENCOUNTER — Ambulatory Visit (HOSPITAL_COMMUNITY)
Admission: RE | Admit: 2023-12-23 | Discharge: 2023-12-23 | Disposition: A | Discharge status: Home / Self Care | Payer: Medicare (Managed Care) | Attending: Surgery | Admitting: Surgery

## 2023-12-23 ENCOUNTER — Encounter (HOSPITAL_COMMUNITY)
Admission: RE | Disposition: A | Discharge status: Home / Self Care | Payer: Self-pay | Source: Home / Self Care | Attending: Surgery

## 2023-12-23 ENCOUNTER — Other Ambulatory Visit: Payer: Self-pay

## 2023-12-23 DIAGNOSIS — F17210 Nicotine dependence, cigarettes, uncomplicated: Secondary | ICD-10-CM | POA: Diagnosis not present

## 2023-12-23 DIAGNOSIS — I700 Atherosclerosis of aorta: Secondary | ICD-10-CM

## 2023-12-23 DIAGNOSIS — I70248 Atherosclerosis of native arteries of left leg with ulceration of other part of lower left leg: Secondary | ICD-10-CM | POA: Diagnosis not present

## 2023-12-23 DIAGNOSIS — Z7984 Long term (current) use of oral hypoglycemic drugs: Secondary | ICD-10-CM | POA: Diagnosis not present

## 2023-12-23 DIAGNOSIS — Z89611 Acquired absence of right leg above knee: Secondary | ICD-10-CM | POA: Insufficient documentation

## 2023-12-23 DIAGNOSIS — L97529 Non-pressure chronic ulcer of other part of left foot with unspecified severity: Secondary | ICD-10-CM | POA: Diagnosis not present

## 2023-12-23 DIAGNOSIS — E11621 Type 2 diabetes mellitus with foot ulcer: Secondary | ICD-10-CM | POA: Diagnosis present

## 2023-12-23 DIAGNOSIS — E1151 Type 2 diabetes mellitus with diabetic peripheral angiopathy without gangrene: Secondary | ICD-10-CM | POA: Insufficient documentation

## 2023-12-23 DIAGNOSIS — I708 Atherosclerosis of other arteries: Secondary | ICD-10-CM | POA: Diagnosis not present

## 2023-12-23 DIAGNOSIS — Z794 Long term (current) use of insulin: Secondary | ICD-10-CM | POA: Diagnosis not present

## 2023-12-23 DIAGNOSIS — I7025 Atherosclerosis of native arteries of other extremities with ulceration: Secondary | ICD-10-CM | POA: Insufficient documentation

## 2023-12-23 HISTORY — PX: ABDOMINAL AORTOGRAM: CATH118222

## 2023-12-23 HISTORY — PX: LOWER EXTREMITY INTERVENTION: CATH118252

## 2023-12-23 HISTORY — PX: LOWER EXTREMITY ANGIOGRAPHY: CATH118251

## 2023-12-23 LAB — POCT I-STAT, CHEM 8
BUN: 14 mg/dL (ref 8–23)
Calcium, Ion: 0.84 mmol/L — CL (ref 1.15–1.40)
Chloride: 96 mmol/L — ABNORMAL LOW (ref 98–111)
Creatinine, Ser: 0.7 mg/dL (ref 0.61–1.24)
Glucose, Bld: 189 mg/dL — ABNORMAL HIGH (ref 70–99)
HCT: 38 % — ABNORMAL LOW (ref 39.0–52.0)
Hemoglobin: 12.9 g/dL — ABNORMAL LOW (ref 13.0–17.0)
Potassium: 4 mmol/L (ref 3.5–5.1)
Sodium: 134 mmol/L — ABNORMAL LOW (ref 135–145)
TCO2: 30 mmol/L (ref 22–32)

## 2023-12-23 LAB — GLUCOSE, CAPILLARY: Glucose-Capillary: 173 mg/dL — ABNORMAL HIGH (ref 70–99)

## 2023-12-23 SURGERY — ABDOMINAL AORTOGRAM
Anesthesia: LOCAL

## 2023-12-23 MED ORDER — ASPIRIN 81 MG PO CHEW
CHEWABLE_TABLET | ORAL | Status: AC
  Filled 2023-12-23: qty 1

## 2023-12-23 MED ORDER — ONDANSETRON HCL 4 MG/2ML IJ SOLN: 4.0000 mg | Freq: Four times a day (QID) | INTRAMUSCULAR | Status: DC | PRN

## 2023-12-23 MED ORDER — MIDAZOLAM HCL (PF) 2 MG/2ML IJ SOLN
INTRAMUSCULAR | Status: DC | PRN
  Administered 2023-12-23: 1 mg via INTRAVENOUS

## 2023-12-23 MED ORDER — SODIUM CHLORIDE 0.9 % IV SOLN: INTRAVENOUS | Status: DC

## 2023-12-23 MED ORDER — OXYCODONE HCL 5 MG PO TABS: 5.0000 mg | ORAL_TABLET | ORAL | Status: DC | PRN

## 2023-12-23 MED ORDER — ASPIRIN 81 MG PO CHEW
CHEWABLE_TABLET | ORAL | Status: DC | PRN
  Administered 2023-12-23: 81 mg via ORAL

## 2023-12-23 MED ORDER — LIDOCAINE HCL (PF) 1 % IJ SOLN
INTRAMUSCULAR | Status: DC | PRN
  Administered 2023-12-23: 10 mL via INTRADERMAL

## 2023-12-23 MED ORDER — HYDRALAZINE HCL 20 MG/ML IJ SOLN: 5.0000 mg | INTRAMUSCULAR | Status: DC | PRN

## 2023-12-23 MED ORDER — CLOPIDOGREL BISULFATE 300 MG PO TABS
ORAL_TABLET | ORAL | Status: DC | PRN
  Administered 2023-12-23: 75 mg via ORAL

## 2023-12-23 MED ORDER — SODIUM CHLORIDE 0.9% FLUSH: 3.0000 mL | Freq: Two times a day (BID) | INTRAVENOUS | Status: DC

## 2023-12-23 MED ORDER — HEPARIN (PORCINE) IN NACL 1000-0.9 UT/500ML-% IV SOLN
INTRAVENOUS | Status: DC | PRN
  Administered 2023-12-23 (×2): 500 mL

## 2023-12-23 MED ORDER — MORPHINE SULFATE (PF) 2 MG/ML IV SOLN: 2.0000 mg | INTRAVENOUS | Status: DC | PRN

## 2023-12-23 MED ORDER — FENTANYL CITRATE (PF) 100 MCG/2ML IJ SOLN
INTRAMUSCULAR | Status: DC | PRN
  Administered 2023-12-23: 50 ug via INTRAVENOUS

## 2023-12-23 MED ORDER — ACETAMINOPHEN 325 MG PO TABS: 650.0000 mg | ORAL_TABLET | ORAL | Status: DC | PRN

## 2023-12-23 MED ORDER — FENTANYL CITRATE (PF) 100 MCG/2ML IJ SOLN
INTRAMUSCULAR | Status: AC
  Filled 2023-12-23: qty 2

## 2023-12-23 MED ORDER — MIDAZOLAM HCL 2 MG/2ML IJ SOLN
INTRAMUSCULAR | Status: AC
  Filled 2023-12-23: qty 2

## 2023-12-23 MED ORDER — CLOPIDOGREL BISULFATE 75 MG PO TABS
ORAL_TABLET | ORAL | Status: AC
  Filled 2023-12-23: qty 1

## 2023-12-23 MED ORDER — ASPIRIN 81 MG PO TBEC: 81.0000 mg | DELAYED_RELEASE_TABLET | Freq: Every day | ORAL | Status: DC

## 2023-12-23 MED ORDER — LIDOCAINE HCL (PF) 1 % IJ SOLN
INTRAMUSCULAR | Status: AC
Start: 2023-12-23 — End: 2023-12-23
  Filled 2023-12-23: qty 30

## 2023-12-23 MED ORDER — SODIUM CHLORIDE 0.9 % IV SOLN: 250.0000 mL | INTRAVENOUS | Status: DC | PRN

## 2023-12-23 MED ORDER — CLOPIDOGREL BISULFATE 75 MG PO TABS: 75.0000 mg | ORAL_TABLET | Freq: Every day | ORAL | Status: DC

## 2023-12-23 MED ORDER — SODIUM CHLORIDE 0.9 % WEIGHT BASED INFUSION: 1.0000 mL/kg/h | INTRAVENOUS | Status: DC

## 2023-12-23 MED ORDER — SODIUM CHLORIDE 0.9% FLUSH: 3.0000 mL | INTRAVENOUS | Status: DC | PRN

## 2023-12-23 MED ORDER — LABETALOL HCL 5 MG/ML IV SOLN: 10.0000 mg | INTRAVENOUS | Status: DC | PRN

## 2023-12-23 MED ORDER — HEPARIN SODIUM (PORCINE) 1000 UNIT/ML IJ SOLN
INTRAMUSCULAR | Status: DC | PRN
  Administered 2023-12-23: 8000 [IU] via INTRAVENOUS

## 2023-12-23 MED ORDER — IODIXANOL 320 MG/ML IV SOLN
INTRAVENOUS | Status: DC | PRN
  Administered 2023-12-23: 70 mL

## 2023-12-23 SURGICAL SUPPLY — 25 items
BALLOON MUSTANG 5X150X135 (BALLOONS) IMPLANT
BALLOON MUSTANG 7.0X40 75 (BALLOONS) IMPLANT
CATH OMNI FLUSH 5F 65CM (CATHETERS) IMPLANT
CATH QUICKCROSS SUPP .035X90CM (MICROCATHETER) IMPLANT
DEVICE INFLATION ENCORE 26 (MISCELLANEOUS) IMPLANT
DEVICE TORQUE SEADRAGON GRN (MISCELLANEOUS) IMPLANT
DEVICE VASC CLSR CELT ART 6 (Vascular Products) IMPLANT
GLIDEWIRE ADV .035X260CM (WIRE) IMPLANT
GUIDEWIRE ANGLED .035 180CM (WIRE) IMPLANT
KIT MICROPUNCTURE NIT STIFF (SHEATH) IMPLANT
KIT PV (KITS) ×1 IMPLANT
KIT SINGLE USE MANIFOLD (KITS) IMPLANT
KIT SYRINGE INJ CVI SPIKEX1 (MISCELLANEOUS) IMPLANT
SET ATX-X65L (MISCELLANEOUS) IMPLANT
SHEATH CATAPULT 6FR 45 (SHEATH) IMPLANT
SHEATH PINNACLE 5F 10CM (SHEATH) IMPLANT
SHEATH PINNACLE 6F 10CM (SHEATH) IMPLANT
SHEATH PROBE COVER 6X72 (BAG) IMPLANT
STENT ELUVIA 6X150X130 (Permanent Stent) IMPLANT
STENT ELUVIA 6X40X130 (Permanent Stent) IMPLANT
STENT ZILVER PTX 8X40 (Permanent Stent) IMPLANT
SYR MEDRAD MARK 7 150ML (SYRINGE) ×1 IMPLANT
TRANSDUCER W/STOPCOCK (MISCELLANEOUS) ×1 IMPLANT
TRAY PV CATH (CUSTOM PROCEDURE TRAY) ×1 IMPLANT
WIRE BENTSON .035X145CM (WIRE) IMPLANT

## 2023-12-23 NOTE — Discharge Instructions (Signed)
 Femoral Site Care This sheet gives you information about how to care for yourself after your procedure. Your health care provider may also give you more specific instructions. If you have problems or questions, contact your health care provider. What can I expect after the procedure?  After the procedure, it is common to have: Bruising that usually fades within 1-2 weeks. Tenderness at the site. Follow these instructions at home: Wound care Follow instructions from your health care provider about how to take care of your insertion site. Make sure you: Wash your hands with soap and water  before you change your bandage (dressing). If soap and water  are not available, use hand sanitizer. Remove your dressing as told by your health care provider. 24 hours Do not take baths, swim, or use a hot tub until your health care provider approves. You may shower 24-48 hours after the procedure or as told by your health care provider. Gently wash the site with plain soap and water . Pat the area dry with a clean towel. Do not rub the site. This may cause bleeding. Do not apply powder or lotion to the site. Keep the site clean and dry. Check your femoral site every day for signs of infection. Check for: Redness, swelling, or pain. Fluid or blood. Warmth. Pus or a bad smell. Activity For the first 2-3 days after your procedure, or as long as directed: Avoid climbing stairs as much as possible. Do not squat. Do not lift anything that is heavier than 10 lb (4.5 kg), or the limit that you are told, until your health care provider says that it is safe. For 5 days Rest as directed. Avoid sitting for a long time without moving. Get up to take short walks every 1-2 hours. Do not drive for 24 hours if you were given a medicine to help you relax (sedative). General instructions Take over-the-counter and prescription medicines only as told by your health care provider. Keep all follow-up visits as told by your  health care provider. This is important. Contact a health care provider if you have: A fever or chills. You have redness, swelling, or pain around your insertion site. Get help right away if: The catheter insertion area swells very fast. You pass out. You suddenly start to sweat or your skin gets clammy. The catheter insertion area is bleeding, and the bleeding does not stop when you hold steady pressure on the area. The area near or just beyond the catheter insertion site becomes pale, cool, tingly, or numb. These symptoms may represent a serious problem that is an emergency. Do not wait to see if the symptoms will go away. Get medical help right away. Call your local emergency services (911 in the [place] Do not drive yourself to the hospital. Summary After the procedure, it is common to have bruising that usually fades within 1-2 weeks. Check your femoral site every day for signs of infection. Do not lift anything that is heavier than 10 lb (4.5 kg), or the limit that you are told, until your health care provider says that it is safe. This information is not intended to replace advice given to you by your health care provider. Make sure you discuss any questions you have with your health care provider. Document Revised: 03/03/2017 Document Reviewed: 03/03/2017 Elsevier Patient Education  2020 [employer].

## 2023-12-23 NOTE — Interval H&P Note (Signed)
 History and Physical Interval Note:  12/23/2023 7:37 AM  Brian Zavala  has presented today for surgery, with the diagnosis of atherosclerrosis bilateral with claudication.  The various methods of treatment have been discussed with the patient and family. After consideration of risks, benefits and other options for treatment, the patient has consented to  Procedure(s): ABDOMINAL AORTOGRAM (N/A) Lower Extremity Angiography (N/A) LOWER EXTREMITY INTERVENTION (N/A) as a surgical intervention.  The patient's history has been reviewed, patient examined, no change in status, stable for surgery.  I have reviewed the patient's chart and labs.  Questions were answered to the patient's satisfaction.     Malvina New

## 2023-12-23 NOTE — Op Note (Signed)
 Patient name: Brian Zavala MRN: [id number] DOB: Nov 16, 1950 Sex: male  12/23/2023 Pre-operative Diagnosis: Left foot ulcer Post-operative diagnosis:  Same Surgeon:  Malvina New Procedure Performed:  1.  Ultrasound-guided access, right femoral artery  2.  Aortobifemoral angiogram  3.  Left leg angiogram  4.  Selective injection with catheter left popliteal artery  5.  Stent, left superficial femoral and popliteal artery  6.  Stent, left external iliac artery  7.  Conscious sedation, 65 minutes  8.  Closure device, Celt   Indications: This is a 73 year old gentleman with history of right above-knee amputation who has chronic wound on his left foot.  He comes in today for angiography and possible intervention  Procedure:  The patient was identified in the holding area and taken to room 8.  The patient was then placed supine on the table and prepped and draped in the usual sterile fashion.  A time out was called.  Conscious sedation was administered with the use of IV fentanyl  and Versed  under continuous physician and nurse monitoring.  Heart rate, blood pressure, and oxygen saturation were continuously monitored.  Total sedation time was 65 minutes.  Ultrasound was used to evaluate the right common femoral artery.  It was patent .  A digital ultrasound image was acquired.  A micropuncture needle was used to access the right common femoral artery under ultrasound guidance.  An 018 wire was advanced without resistance and a micropuncture sheath was placed.  The 018 wire was removed and a benson wire was placed.  The micropuncture sheath was exchanged for a 5 french sheath.  An omniflush catheter was advanced over the wire to the level of L-1.  An abdominal angiogram was obtained.  Next, using the omniflush catheter and a benson wire, the aortic bifurcation was crossed and the catheter was placed into theleft external iliac artery and left runoff was obtained.   Findings:   Aortogram: No  significant renal artery stenosis was visualized.  The infrarenal abdominal aorta is calcified but without significant stenosis.  Right common external iliac artery calcified without significant stenosis.  The right common femoral artery is patent.  The left common iliac artery is heavily calcified.  There is approximately 40% stenosis at the distal portion.  There is a high-grade lesion at the origin of the internal iliac artery.  There is a greater than 80% calcified lesion in the left external iliac artery.  Left Lower Extremity: The left common femoral and profundofemoral artery are patent without significant stenosis.  There is a haziness at the origin of the superficial femoral artery however this does not appear to be clinically significant.  The superficial femoral artery is heavily calcified.  There is approximately 50% stenosis in the midportion.  There is a high-grade lesion in the popliteal artery just above the joint space, greater than 80%.  The below-knee popliteal artery is widely patent.  There is three-vessel runoff to the ankle with the posterior tibial being the dominant vessel across the ankle however the dorsalis pedis also crosses the ankle.  Intervention: After the above images were acquired the decision was made to proceed with intervention.  A 6 French sheath was placed into the superficial femoral artery.  The patient was fully heparinized.  A 035 Glidewire and a quick cross catheter was used to cross the lesions.  A selective injection with a cath in the popliteal artery was performed confirming successful crossing of the lesion.  The lesion was then predilated with  a 5 mm balloon.  I then placed an overlapping 6 x 150, 6 x 150, and a 6 x 40 Eluvia stent that were postdilated with a 5 mm balloon.  Completion imaging revealed resolution of the stenosis to less than 10%.  There was no change in runoff.  Attention was then turned towards the left external iliac artery.  I did pullback  pressures across the external lesion and there was a greater than 40 point gradient.  I did not get a significant gradient change across the common iliac artery.  I elected to stent the external iliac with an 8 x 40 Cook Zilver PTX which was postdilated with a 7 mm balloon.  Follow-up imaging revealed resolution of the stenosis.  At this point catheters and wires were removed.  The long sheath was exchanged out for a short 6 French sheath and a Celt was used for closure.  Impression:  #1  Nearly occlusive lesion in the left popliteal artery just proximal to the joint space.  Calcific disease extended into the superficial femoral artery and so overlapping 6 mm stents Eluvia stents were used to treat the lesions  #2  Greater than 80% stenosis in the left external iliac artery successfully treated using a Cook Zilver PTX 8 x 40 stent  #3  Three-vessel runoff to the ankle.  The posterior tibial is the dominant vessel out of the foot however the anterior tibial does cross the ankle.    ALONSO Malvina New, M.D., [facility] [place] Office: [phone] Pager:  [phone]

## 2023-12-23 NOTE — Progress Notes (Signed)
 Discharge instructions given to the patients sister, [name] Pouch over the phone. No concerns voiced. Paper copy given to the patient.

## 2023-12-23 NOTE — Progress Notes (Signed)
 Patient transferred to the wheelchair after getting dressed. No bleeding or hematoma noted to right groin site. Dressing  clean, dry, and intact. Patient discharged home.

## 2023-12-24 ENCOUNTER — Encounter (HOSPITAL_COMMUNITY): Payer: Self-pay | Admitting: Surgery

## 2023-12-24 ENCOUNTER — Encounter (HOSPITAL_COMMUNITY)
Admission: RE | Admit: 2023-12-24 | Discharge: 2023-12-24 | Disposition: A | Discharge status: Home / Self Care | Payer: Medicare (Managed Care) | Source: Ambulatory Visit | Attending: Physician Assistant | Admitting: Physician Assistant

## 2023-12-24 DIAGNOSIS — R1033 Periumbilical pain: Secondary | ICD-10-CM | POA: Diagnosis present

## 2023-12-24 DIAGNOSIS — R110 Nausea: Secondary | ICD-10-CM | POA: Insufficient documentation

## 2023-12-24 DIAGNOSIS — R1013 Epigastric pain: Secondary | ICD-10-CM | POA: Diagnosis present

## 2023-12-24 MED ORDER — TECHNETIUM TC 99M SULFUR COLLOID
2.2000 | Freq: Once | INTRAVENOUS | Status: AC
  Administered 2023-12-24: 2.2 via ORAL

## 2023-12-25 ENCOUNTER — Ambulatory Visit: Payer: Medicare (Managed Care) | Admitting: "Endocrinology

## 2023-12-31 ENCOUNTER — Other Ambulatory Visit: Payer: Self-pay | Admitting: Surgery

## 2023-12-31 DIAGNOSIS — I70245 Atherosclerosis of native arteries of left leg with ulceration of other part of foot: Secondary | ICD-10-CM

## 2023-12-31 DIAGNOSIS — I70248 Atherosclerosis of native arteries of left leg with ulceration of other part of lower left leg: Secondary | ICD-10-CM

## 2023-12-31 DIAGNOSIS — I70213 Atherosclerosis of native arteries of extremities with intermittent claudication, bilateral legs: Secondary | ICD-10-CM

## 2023-12-31 DIAGNOSIS — I70229 Atherosclerosis of native arteries of extremities with rest pain, unspecified extremity: Secondary | ICD-10-CM

## 2024-01-01 NOTE — Progress Notes (Deleted)
 01/01/2024 Brian Zavala [id number] 04/11/1950  Referring provider: Cloria Annabella CROME, DO Primary GI doctor: Dr. Charlanne  ASSESSMENT AND PLAN:  Nausea with associated epigastric pain, sweating, worse in late afternoon, not associated with food or meds, sporadic 1-2 x a week 2015 GES normal 04/2023 EGD normal esophagus, Z-line irregular, gastritis normal duodenum, Negative H pylori, metaplasia, barret's 10/2023 CTA without focal stenosis at SMA/celiac, unremarkable otherwise On pantoprazole  40 mg daily without help No weight loss 12/2023 cortisol negative, thyroid, lipase normal 12/2023 normal GES - - GES, gastroparesis diet given, can continue reglan  as needed -Aggressive Bowel regimen, had some improvement directly after the colonoscopy, trial of linzess 290 after bowel purge, consider motegrity - consider US  mesentary arteries but no weight loss and CTA with no focal stenosis  Constipation BM every other day, mild straining - Increase fiber/ water intake -Will add on Linzess 290, consider Motegrety  Type 2 diabetes Off ozempic, on insulin   Peripheral vascular disease status post 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 Serene On Plavix   CVA July 2024  with carotid artery stenosis  RA On Enbrel   Screening colonoscopy 04/21/2023 colonoscopy good bowel prep normal TI, unremarkable no repeat colonoscopy due to age  COPD Not on oxygen  Patient Care Team: Cloria Annabella CROME, DO as PCP - General (Geriatric Medicine)  HISTORY OF PRESENT ILLNESS: 73 y.o. male with a past medical history of hypertension, diabetes type 2, peripheral vascular disease status post right AKA, carotid artery stenosis with CVA in July on dual antiplatelet therapy, RA and others listed below presents for evaluation of AB pain and nausea.   I last saw the patient in the office 12/09/2023 for abdominal pain and nausea.  Discussed the use  of AI scribe software for clinical note transcription with the patient, who gave verbal consent to proceed.  History of Present Illness            He  reports that he has been smoking cigarettes. He started smoking about 58 years ago. He has a 57.9 pack-year smoking history. He has never used smokeless tobacco. He reports that he does not drink alcohol and does not use drugs.  RELEVANT GI HISTORY, IMAGING AND LABS: Results          CBC    Component Value Date/Time   WBC 9.0 12/09/2023 0925   RBC 4.62 12/09/2023 0925   HGB 12.9 (L) 12/23/2023 0615   HGB 14.9 05/02/2017 1408   HCT 38.0 (L) 12/23/2023 0615   HCT 43.3 05/02/2017 1408   PLT 390.0 12/09/2023 0925   PLT 235 05/02/2017 1408   MCV 78.9 12/09/2023 0925   MCV 85 05/02/2017 1408   MCH 26.4 10/29/2023 0301   MCHC 33.0 12/09/2023 0925   RDW 16.6 (H) 12/09/2023 0925   RDW 14.6 05/02/2017 1408   LYMPHSABS 0.7 12/09/2023 0925   LYMPHSABS 1.4 05/02/2017 1408   MONOABS 0.7 12/09/2023 0925   EOSABS 0.1 12/09/2023 0925   EOSABS 0.2 05/02/2017 1408   BASOSABS 0.1 12/09/2023 0925   BASOSABS 0.0 05/02/2017 1408   Recent Labs    04/21/23 0423 08/18/23 1814 10/26/23 2311 10/28/23 0348 10/29/23 0301 12/09/23 0925 12/23/23 0615  HGB 15.6 13.2 14.9 13.3 13.1 12.0* 12.9*    CMP     Component Value Date/Time   NA 134 (L) 12/23/2023 0615   NA 138 05/02/2017 1408   K 4.0 12/23/2023 0615   CL 96 (  L) 12/23/2023 0615   CO2 33 (H) 12/09/2023 0925   GLUCOSE 189 (H) 12/23/2023 0615   BUN 14 12/23/2023 0615   BUN 8 05/02/2017 1408   CREATININE 0.70 12/23/2023 0615   CREATININE 0.84 12/04/2017 1446   CALCIUM  8.8 12/09/2023 0925   CALCIUM  8.6 05/29/2023 0000   PROT 6.5 12/09/2023 0925   PROT 6.8 08/22/2022 1326   ALBUMIN 3.1 (L) 12/09/2023 0925   ALBUMIN 4.0 08/22/2022 1326   AST 26 12/09/2023 0925   ALT 30 12/09/2023 0925   ALKPHOS 202 (H) 12/09/2023 0925   BILITOT 0.6 12/09/2023 0925   BILITOT 0.4 08/22/2022 1326    GFRNONAA >60 10/30/2023 0208   GFRAA >60 11/27/2018 0931      Latest Ref Rng & Units 12/09/2023    9:25 AM 10/29/2023    3:01 AM 10/26/2023   11:11 PM  Hepatic Function  Total Protein 6.0 - 8.3 g/dL 6.5  6.0  7.6   Albumin 3.5 - 5.2 g/dL 3.1  2.3  3.0   AST 0 - 37 U/L 26  20  20    ALT 0 - 53 U/L 30  16  18    Alk Phosphatase 39 - 117 U/L 202  64  83   Total Bilirubin 0.2 - 1.2 mg/dL 0.6  0.6  1.0       Current Medications:   Current Outpatient Medications (Endocrine & Metabolic):    Glucagon  (BAQSIMI  ONE PACK) 3 MG/DOSE POWD, Place 1 Device into the nose as needed (Low blood sugar with impaired consciousness).   insulin  aspart (NOVOLOG ) 100 UNIT/ML injection, Inject 2-12 Units into the skin 4 (four) times daily -  before meals and at bedtime. Inject 2-12 units subcutaneously based on sliding scale:  131-180   2 units 181-240   4 units 241-300   6 units 301-350   8 units 351-400   10 units >400        12 units   insulin  glargine (SEMGLEE ) 100 UNIT/ML injection, Inject 0.7 mLs (70 Units total) into the skin daily. (Patient taking differently: Inject 72 Units into the skin in the morning.)   metFORMIN  (GLUCOPHAGE -XR) 500 MG 24 hr tablet, Take 1,000 mg by mouth 2 (two) times daily with a meal.   predniSONE  (DELTASONE ) 1 MG tablet, Take 2 mg by mouth daily with breakfast.  Current Outpatient Medications (Cardiovascular):    atorvastatin  (LIPITOR ) 80 MG tablet, Take 1 tablet (80 mg total) by mouth daily at 6 PM. (Patient taking differently: Take 80 mg by mouth in the morning.)   hydrochlorothiazide  (MICROZIDE ) 12.5 MG capsule, Take 12.5 mg by mouth daily.   metoprolol  succinate (TOPROL -XL) 25 MG 24 hr tablet, Take 25 mg by mouth at bedtime.  Current Outpatient Medications (Respiratory):    oxymetazoline  (AFRIN) 0.05 % nasal spray, Place 1 spray into both nostrils 2 (two) times daily as needed for congestion.   promethazine  (PHENERGAN ) 25 MG tablet, Take 25 mg by mouth every 8 (eight) hours  as needed for nausea or vomiting.  Current Outpatient Medications (Analgesics):    aspirin  81 MG chewable tablet, Chew 81 mg by mouth in the morning.   etanercept  (ENBREL ) 50 MG/ML injection, Inject 50 mg into the skin every Sunday.   leflunomide  (ARAVA ) 20 MG tablet, Take 20 mg by mouth daily.   morphine  (MS CONTIN ) 100 MG 12 hr tablet, Take 100 mg by mouth every 12 (twelve) hours.  Current Outpatient Medications (Hematological):    clopidogrel  (PLAVIX ) 75 MG tablet, Take  1 tablet (75 mg total) by mouth daily.   Cyanocobalamin  (VITAMIN B-12 PO), Take 1 tablet by mouth daily.  Current Outpatient Medications (Other):    calcium  carbonate (OSCAL) 1500 (600 Ca) MG TABS tablet, Take 600 mg of elemental calcium  by mouth daily with breakfast.    metoCLOPramide  (REGLAN ) 5 MG tablet, Take 5 mg by mouth 4 (four) times daily as needed for nausea or vomiting.   ondansetron  (ZOFRAN ) 4 MG tablet, Take 1 tablet (4 mg total) by mouth every 6 (six) hours.   pantoprazole  (PROTONIX ) 40 MG tablet, Take 1 tablet (40 mg total) by mouth daily before breakfast. (Patient taking differently: Take 40 mg by mouth daily as needed (indigestion/heartburn.).)   pregabalin  (LYRICA ) 150 MG capsule, Take 150 mg by mouth 2 (two) times daily.   Sodium Fluoride (PREVIDENT 5000 BOOSTER PLUS DT), Place 1 application  onto teeth every evening.   triamcinolone cream (KENALOG) 0.1 %, Apply 1 Application topically daily as needed (Rash).  Medical History:  Past Medical History:  Diagnosis Date   Arthritis    pretty much all over   Cellulitis of left foot    Colitis    COPD (chronic obstructive pulmonary disease) (HCC)    Dyspnea    Hx of BKA, right (HCC)    Hyperlipidemia    Hypertension    Nausea and vomiting 08/20/2022   Neuromuscular disorder (HCC)    neuropathy legs   Neuropathy    legs   Osteomyelitis (HCC) 04/2018   4th toe left foot   Osteomyelitis of fourth toe of left foot (HCC) 04/19/2018   Peripheral  vascular disease    Rheumatoid arthritis (HCC)    Sleep apnea    does not use a cpap   Stroke (HCC)    Type II diabetes mellitus (HCC)    Wears glasses    Wears partial dentures    top and bottom partials   Allergies:  Allergies  Allergen Reactions   Chantix [Varenicline] Nausea Only     Surgical History:  He  has a past surgical history that includes Leg amputation below knee (Right, 2005); Lumbar disc surgery (82,90); Toe amputation (Right, 2005); Foot Amputation (Right, 2005); Scar revision (Right, 2005); Cyst excision perineal (N/A, 12/08/2012); Cholecystectomy (N/A, 10/26/2013); Incise and drain abcess (12/02/2014); Back surgery; Incision and drainage perirectal abscess (Left, 12/02/2014); Cyst excision (Bilateral, 02/15/2015); Colonoscopy; I & D extremity (Right, 03/07/2017); Lower Extremity Angiography (N/A, 05/05/2017); PERIPHERAL VASCULAR INTERVENTION (Right, 05/05/2017); Amputation (Left, 04/22/2018); Stump revision (Right, 08/21/2018); Stump revision (Right, 09/09/2018); ABDOMINAL AORTOGRAM W/LOWER EXTREMITY (Bilateral, 10/19/2018); PERIPHERAL VASCULAR INTERVENTION (Right, 10/19/2018); Above knee leg amputaton (Right, 11/27/2018); Amputation (Right, 11/27/2018); Transcarotid artery revascularization (Left, 09/04/2022); Ultrasound guidance for vascular access (Right, 09/04/2022); Esophagogastroduodenoscopy (egd) with propofol  (N/A, 04/21/2023); Colonoscopy with propofol  (N/A, 04/21/2023); biopsy (04/21/2023); ABDOMINAL AORTOGRAM (N/A, 12/23/2023); Lower Extremity Angiography (N/A, 12/23/2023); and LOWER EXTREMITY INTERVENTION (N/A, 12/23/2023). Family History:  His family history includes Cirrhosis in his father; Diabetes in his maternal grandmother and mother; Hypothyroidism in his mother; Thyroid disease in his daughter.  REVIEW OF SYSTEMS  : All other systems reviewed and negative except where noted in the History of Present Illness.  PHYSICAL EXAM: There were no vitals taken for this visit. Physical  Exam   GENERAL APPEARANCE: chronically ill appearing, in no apparent distress. HEENT: No cervical lymphadenopathy, unremarkable thyroid, sclerae anicteric, conjunctiva pink, poor dentition RESPIRATORY: Respiratory effort normal, rhonchi/wheezing bilateral lungs CARDIO: Regular rate and rhythm with no murmurs, rubs, or gallops, peripheral pulses intact.  ABDOMEN: Soft, non-distended,obese, hypoactive bowel sounds in all four quadrants, non-tender to palpation, no rebound, no mass appreciated. RECTAL: Declines. MUSCULOSKELETAL: s/p right AKA, in wheelchair, atrophy present globally SKIN: Dry, intact without rashes or lesions. No jaundice. NEURO: Alert, oriented, no focal deficits. PSYCH: Cooperative, normal mood and affect.      Alan JONELLE Coombs, PA-C 1:38 PM

## 2024-01-02 ENCOUNTER — Ambulatory Visit: Payer: Medicare (Managed Care) | Admitting: Physician Assistant

## 2024-01-05 ENCOUNTER — Ambulatory Visit: Payer: Medicare (Managed Care) | Admitting: Physician Assistant

## 2024-01-08 ENCOUNTER — Telehealth: Payer: Self-pay

## 2024-01-08 DIAGNOSIS — D649 Anemia, unspecified: Secondary | ICD-10-CM

## 2024-01-08 DIAGNOSIS — R748 Abnormal levels of other serum enzymes: Secondary | ICD-10-CM

## 2024-01-08 NOTE — Telephone Encounter (Signed)
 Spoke with patient to remind him that he is due for repeat labs at this time. No appointment is necessary. Patient is aware that he can stop by the lab in the basement at his convenience between 7:30 AM - 5 PM, Monday through Friday. Patient verbalized understanding and had no concerns at the end of the call.   Lab orders are in epic.

## 2024-01-08 NOTE — Telephone Encounter (Signed)
-----   Message from Nurse Lino Lakes B sent at 12/11/2023 11:58 AM EDT ----- Regarding: 4-week lab reminder Hepatic function panel/GGT/IBC + Ferritin - need to order - Craig

## 2024-01-13 ENCOUNTER — Ambulatory Visit: Payer: Medicare (Managed Care) | Attending: Internal Medicine | Admitting: Internal Medicine

## 2024-01-13 ENCOUNTER — Encounter: Payer: Self-pay | Admitting: Internal Medicine

## 2024-01-13 VITALS — BP 94/70 | HR 103 | Temp 96.4°F | Resp 16 | Ht 72.0 in

## 2024-01-13 DIAGNOSIS — S88111S Complete traumatic amputation at level between knee and ankle, right lower leg, sequela: Secondary | ICD-10-CM

## 2024-01-13 DIAGNOSIS — Z79899 Other long term (current) drug therapy: Secondary | ICD-10-CM | POA: Diagnosis not present

## 2024-01-13 DIAGNOSIS — M0579 Rheumatoid arthritis with rheumatoid factor of multiple sites without organ or systems involvement: Secondary | ICD-10-CM

## 2024-01-13 DIAGNOSIS — I739 Peripheral vascular disease, unspecified: Secondary | ICD-10-CM | POA: Diagnosis not present

## 2024-01-13 NOTE — Progress Notes (Signed)
 Office Visit Note  Patient: Brian Zavala             Date of Birth: [birth year]           MRN: [id number]             PCP: Brian Annabella CROME, DO Referring: Brian Laymon HERO, NP Visit Date: 01/13/2024 Occupation: Data Unavailable  Subjective:  New Patient (Initial Visit) (Patient states he is here for arthritis. Patient is currently prescribed Prednisone , Leflunomide , and Enbrel . )   Discussed the use of AI scribe software for clinical note transcription with the patient, who gave verbal consent to proceed.  History of Present Illness   Brian Zavala is a 73 year old male with rheumatoid arthritis who presents for a rheumatology consultation. He is accompanied by his sister, Brian Zavala. He was referred by his PACE doctor for evaluation of his rheumatoid arthritis which has been on treatment for years, currently on leflunomide  20 mg daily and prednisone  2 mg daily.  He has a history of rheumatoid arthritis and has been on Enbrel  injections for several years. However, he has not taken Enbrel  for the past two months due to concerns about infection related to an ulcer on his toe. The ulcer is improving. He is still taking leflunomide  and low-dose prednisone  (2 mg daily). Reducing prednisone  below 2 mg results in hand pain and swelling with multiple prior tapering attempts.  He has experienced stomach problems with leflunomide  in the past but has been feeling better over the last three weeks. He is also on metformin  and morphine , which have opposing, constipating effects on his gastrointestinal symptoms.  Nodules are present on his left elbow, which sometimes swell and drain a clear liquid with small white 'Brian Zavala-like' particles. No pain is associated with these nodules.  He has a history of COPD, attributed to smoking. He recalls a recent incident where he felt like he cracked a rib.  He has a history of a toenail popping off and an ulcer on his toe, which he manages with daily dressing  changes and antibiotic ointment. No edema or swelling in his stump.       Activities of Daily Living:  Patient reports morning stiffness for 30-45 minutes.   Patient Denies nocturnal pain.  Difficulty dressing/grooming: Reports Difficulty climbing Zavala: Reports Difficulty getting out of chair: Reports Difficulty using hands for taps, buttons, cutlery, and/or writing: Reports  Review of Systems  Constitutional:  Positive for fatigue.  HENT:  Positive for mouth dryness. Negative for mouth sores.   Eyes:  Positive for dryness.  Respiratory:  Positive for shortness of breath.   Cardiovascular:  Negative for chest pain and palpitations.  Gastrointestinal:  Negative for blood in stool, constipation and diarrhea.  Endocrine: Negative for increased urination.  Genitourinary:  Negative for involuntary urination.  Musculoskeletal:  Positive for gait problem and morning stiffness. Negative for joint pain, joint pain, joint swelling, myalgias, muscle weakness, muscle tenderness and myalgias.  Skin:  Negative for color change, rash, hair loss and sensitivity to sunlight.  Allergic/Immunologic: Negative for susceptible to infections.  Neurological:  Negative for dizziness and headaches.  Hematological:  Negative for swollen glands.  Psychiatric/Behavioral:  Positive for sleep disturbance. Negative for depressed mood. The patient is not nervous/anxious.     PMFS History:  Patient Active Problem List   Diagnosis Date Noted   High risk medication use 01/13/2024   Gram-negative bacteremia 10/28/2023   Sepsis (HCC) 10/28/2023   Intractable abdominal pain 10/27/2023  Abdominal cramping 04/21/2023   Hypokalemia 04/21/2023   Screening for colorectal cancer 04/21/2023   Periumbilical abdominal pain 01/20/2023   Abdominal pain, generalized 01/20/2023   Screening for colon cancer 01/20/2023   Carotid stenosis, symptomatic w/o infarct 09/04/2022   CVA (cerebral vascular accident) (HCC) 08/31/2022    Intractable nausea 08/20/2022   Colitis 08/20/2022   UTI (urinary tract infection) 08/20/2022   Essential hypertension 08/20/2022   Chronic pain 08/20/2022   Tobacco abuse 08/20/2022   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) 11/27/2018   Hyperlipidemia 11/25/2018   Carotid artery disease 11/25/2018   Critical limb ischemia with history of revascularization of same extremity (HCC) 10/19/2018   Below-knee amputation of right lower extremity (HCC) 09/09/2018   Dehiscence of amputation stump (HCC) 08/18/2018   H/O amputation of lesser toe, left 05/06/2018   Peripheral vascular disease 04/19/2018   Diabetic foot infection (HCC) 04/18/2018   Tobacco abuse counseling    Critical lower limb ischemia (HCC) 05/02/2017   Subacute osteomyelitis of right fibula (HCC)    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, right, sequela 02/03/2017   Diabetes mellitus, insulin  dependent (IDDM), controlled    Type 2 diabetes mellitus with diabetic neuropathy, with long-term current use of insulin  (HCC)    S/P unilateral BKA (below knee amputation) (HCC)    Rheumatoid arthritis (HCC)    Gallstones 09/23/2013   Non-intractable cyclical vomiting with nausea 09/23/2013   Epidermal inclusion cyst 11/30/2012    Past Medical History:  Diagnosis Date   Arthritis    pretty much all over   Cellulitis of left foot    Colitis    COPD (chronic obstructive pulmonary disease) (HCC)    Dyspnea    Hx of BKA, right (HCC)    Hyperlipidemia    Hypertension    Nausea and vomiting 08/20/2022   Neuromuscular disorder (HCC)    neuropathy legs   Neuropathy    legs   Osteomyelitis (HCC) 04/2018   4th toe left foot   Osteomyelitis of fourth toe of left foot (HCC) 04/19/2018   Peripheral vascular disease    Rheumatoid arthritis (HCC)    Sleep apnea    does not use a cpap   Stroke (HCC)    Type II diabetes mellitus (HCC)    Wears glasses    Wears partial dentures    top and bottom partials     Family History  Problem Relation Age of Onset   Hypothyroidism Mother    Diabetes Mother    Cirrhosis Father    Diabetes Maternal Grandmother    Thyroid disease Daughter    Past Surgical History:  Procedure Laterality Date   ABDOMINAL AORTOGRAM N/A 12/23/2023   Procedure: ABDOMINAL AORTOGRAM;  Surgeon: Serene Gaile ORN, MD;  Location: [facility];  Service: Cardiovascular;  Laterality: N/A;   ABDOMINAL AORTOGRAM W/LOWER EXTREMITY Bilateral 10/19/2018   Procedure: ABDOMINAL AORTOGRAM W/LOWER EXTREMITY;  Surgeon: Court Dorn PARAS, MD;  Location: [facility];  Service: Cardiovascular;  Laterality: Bilateral;   ABOVE KNEE LEG AMPUTATION Right 11/27/2018   AMPUTATION Left 04/22/2018   Procedure: LEFT FOOT 4TH RAY AMPUTATION;  Surgeon: Harden Jerona GAILS, MD;  Location: [facility] OR;  Service: Orthopedics;  Laterality: Left;   AMPUTATION Right 11/27/2018   Procedure: RIGHT ABOVE KNEE AMPUTATION;  Surgeon: Harden Jerona GAILS, MD;  Location: [facility] OR;  Service: Orthopedics;  Laterality: Right;   BACK SURGERY     BIOPSY  04/21/2023   Procedure: BIOPSY;  Surgeon: Charlanne Groom, MD;  Location: [facility];  Service: Gastroenterology;;   CHOLECYSTECTOMY N/A 10/26/2013   Procedure: LAPAROSCOPIC CHOLECYSTECTOMY WITH INTRAOPERATIVE CHOLANGIOGRAM;  Surgeon: Debby LABOR. Cornett, MD;  Location: [facility];  Service: General;  Laterality: N/A;   COLONOSCOPY     COLONOSCOPY WITH PROPOFOL  N/A 04/21/2023   Procedure: COLONOSCOPY WITH PROPOFOL ;  Surgeon: Charlanne Groom, MD;  Location: [facility];  Service: Gastroenterology;  Laterality: N/A;   CYST EXCISION Bilateral 02/15/2015   Procedure: LEFT INDEX FINGER AND RIGHT MIDDLE FINGER NODULE EXCISION;  Surgeon: Kay CHRISTELLA Cummins, MD;  Location: [facility];  Service: Orthopedics;  Laterality: Bilateral;   CYST EXCISION PERINEAL N/A 12/08/2012   Procedure: CYST EXCISION PERINeum;  Surgeon: Debby A. Cornett, MD;  Location: [facility];  Service:  General;  Laterality: N/A;   ESOPHAGOGASTRODUODENOSCOPY (EGD) WITH PROPOFOL  N/A 04/21/2023   Procedure: ESOPHAGOGASTRODUODENOSCOPY (EGD) WITH PROPOFOL ;  Surgeon: Charlanne Groom, MD;  Location: [facility];  Service: Gastroenterology;  Laterality: N/A;   FOOT AMPUTATION Right 2005   I & D EXTREMITY Right 03/07/2017   Procedure: EXCISION FIBULAR HEAD RIGHT BELOW KNEE AMPUTATION;  Surgeon: Harden Jerona GAILS, MD;  Location: [facility] OR;  Service: Orthopedics;  Laterality: Right;   INCISE AND DRAIN ABCESS  12/02/2014   PERINEAL ABSCESS   INCISION AND DRAINAGE PERIRECTAL ABSCESS Left 12/02/2014   Procedure: IRRIGATION AND DEBRIDEMENT PERINEAL ABSCESS;  Surgeon: Vicenta Poli, MD;  Location: [facility];  Service: General;  Laterality: Left;   LEG AMPUTATION BELOW KNEE Right 2005   LOWER EXTREMITY ANGIOGRAPHY N/A 05/05/2017   Procedure: LOWER EXTREMITY ANGIOGRAPHY;  Surgeon: Court Dorn PARAS, MD;  [facility]: [facility];  Service: Cardiovascular;  Laterality: N/A;   LOWER EXTREMITY ANGIOGRAPHY N/A 12/23/2023   Procedure: Lower Extremity Angiography;  Surgeon: Serene Gaile ORN, MD;  Location: [facility];  Service: Cardiovascular;  Laterality: N/A;   LOWER EXTREMITY INTERVENTION N/A 12/23/2023   Procedure: LOWER EXTREMITY INTERVENTION;  Surgeon: Serene Gaile ORN, MD;  Location: [facility];  Service: Cardiovascular;  Laterality: N/A;   LUMBAR DISC SURGERY  82,90   ruptured disc   PERIPHERAL VASCULAR INTERVENTION Right 05/05/2017   Procedure: PERIPHERAL VASCULAR INTERVENTION;  Surgeon: Court Dorn PARAS, MD;  Location: [facility];  Service: Cardiovascular;  Laterality: Right;   PERIPHERAL VASCULAR INTERVENTION Right 10/19/2018   Procedure: PERIPHERAL VASCULAR INTERVENTION;  Surgeon: Court Dorn PARAS, MD;  [facility]: [facility];  Service: Cardiovascular;  Laterality: Right;   SCAR REVISION Right 2005   @ amputation   STUMP REVISION Right 08/21/2018   Procedure: REVISION RIGHT BELOW KNEE  AMPUTATION, EXCISION FIBULA;  Surgeon: Harden Jerona GAILS, MD;  Location: [facility];  Service: Orthopedics;  Laterality: Right;   STUMP REVISION Right 09/09/2018   Procedure: REVISION RIGHT BELOW KNEE AMPUTATION;  Surgeon: Harden Jerona GAILS, MD;  Location: [facility] OR;  Service: Orthopedics;  Laterality: Right;   TOE AMPUTATION Right 2005   TRANSCAROTID ARTERY REVASCULARIZATION  Left 09/04/2022   Procedure: Left Transcarotid Artery Revascularization;  Surgeon: Serene Gaile ORN, MD;  Location[facility] OR;  Service: Vascular;  Laterality: Left;   ULTRASOUND GUIDANCE FOR VASCULAR ACCESS Right 09/04/2022   Procedure: ULTRASOUND GUIDANCE FOR VASCULAR ACCESS, RIGHT FEMORAL VEIN;  Surgeon: Serene Gaile ORN, MD;  Location: [facility];  Service: Vascular;  Laterality: Right;   Social History   Tobacco Use   Smoking status: Every Day    Current packs/day: 0.50    Average packs/day:  1 pack/day for 58.9 years (57.9 ttl pk-yrs)    Types: Cigarettes    Start date: 03/04/1965   Smokeless tobacco: Never  Vaping Use   Vaping status: Never Used  Substance Use Topics   Alcohol use: No   Drug use: No   Social History   Social History Narrative   Not on file     Immunization History  Administered Date(s) Administered   Tdap 06/27/2012     Objective: Vital Signs: BP 94/70 (BP Location: Right Arm, Patient Position: Sitting, Cuff Size: Normal)   Pulse (!) 103   Temp (!) 96.4 F (35.8 C)   Resp 16   Ht 6' (1.829 m)   BMI 22.38 kg/m    Physical Exam Constitutional:      Comments: In wheelchair  Eyes:     Conjunctiva/sclera: Conjunctivae normal.  Cardiovascular:     Rate and Rhythm: Normal rate and regular rhythm.  Pulmonary:     Effort: Pulmonary effort is normal.     Comments: Expiratory wheezes, left lower lung field end inspiratory crackles Lymphadenopathy:     Cervical: No cervical adenopathy.  Skin:    General: Skin is warm and dry.     Findings: No rash.  Neurological:     Mental Status: He is alert.   Psychiatric:        Mood and Affect: Mood normal.      Musculoskeletal Exam:  Shoulders full ROM no tenderness or swelling Elbows full ROM, small right side and very lage left sided mobile, nontender nodules over olecranon bursa and forearm Wrists full ROM no tenderness or swelling Fingers chronic bony change with incompletely reducible MCP deviations, no palpable synovitis Left knee full ROM no tenderness or swelling Right leg AKA, left lower leg in dressing/boot swelling   Investigation: No additional findings.  Imaging: NM GASTRIC EMPTYING Result Date: 12/24/2023 CLINICAL DATA:  Gastroparesis suspected EXAM: NUCLEAR MEDICINE GASTRIC EMPTYING SCAN TECHNIQUE: After oral ingestion of radiolabeled meal, sequential abdominal images were obtained for 3 hours. Percentage of activity emptying the stomach was calculated at 1 hour, 2 hour, and 3 hours. RADIOPHARMACEUTICALS:  2.2 mCi Tc-74m sulfur  colloid in standardized meal COMPARISON:  CT August 18, 2023 FINDINGS: Expected location of the stomach in the left upper quadrant. Ingested meal empties the stomach gradually over the course of the study. 45% emptied at 1 hr ( normal >= 10%) 74% emptied at 2 hr ( normal >= 40%) 91% emptied at 3 hr ( normal >= 70%) IMPRESSION: Normal gastric emptying study. Electronically Signed   By: Reyes Holder M.D.   On: 12/24/2023 17:20   PERIPHERAL VASCULAR CATHETERIZATION Result Date: 12/23/2023 Images from the original result were not included. Patient name: VUE PAVON MRN: [id number] DOB: 06/15/1950 Sex: male 12/23/2023 Pre-operative Diagnosis: Left foot ulcer Post-operative diagnosis:  Same Surgeon:  Malvina New Procedure Performed:  1.  Ultrasound-guided access, right femoral artery  2.  Aortobifemoral angiogram  3.  Left leg angiogram  4.  Selective injection with catheter left popliteal artery  5.  Stent, left superficial femoral and popliteal artery  6.  Stent, left external iliac artery  7.  Conscious  sedation, 65 minutes  8.  Closure device, Celt Indications: This is a 73 year old gentleman with history of right above-knee amputation who has chronic wound on his left foot.  He comes in today for angiography and possible intervention Procedure:  The patient was identified in the holding area and taken to room 8.  The patient  was then placed supine on the table and prepped and draped in the usual sterile fashion.  A time out was called.  Conscious sedation was administered with the use of IV fentanyl  and Versed  under continuous physician and nurse monitoring.  Heart rate, blood pressure, and oxygen saturation were continuously monitored.  Total sedation time was 65 minutes.  Ultrasound was used to evaluate the right common femoral artery.  It was patent .  A digital ultrasound image was acquired.  A micropuncture needle was used to access the right common femoral artery under ultrasound guidance.  An 018 wire was advanced without resistance and a micropuncture sheath was placed.  The 018 wire was removed and a benson wire was placed.  The micropuncture sheath was exchanged for a 5 french sheath.  An omniflush catheter was advanced over the wire to the level of L-1.  An abdominal angiogram was obtained.  Next, using the omniflush catheter and a benson wire, the aortic bifurcation was crossed and the catheter was placed into theleft external iliac artery and left runoff was obtained.  Findings:  Aortogram: No significant renal artery stenosis was visualized.  The infrarenal abdominal aorta is calcified but without significant stenosis.  Right common external iliac artery calcified without significant stenosis.  The right common femoral artery is patent.  The left common iliac artery is heavily calcified.  There is approximately 40% stenosis at the distal portion.  There is a high-grade lesion at the origin of the internal iliac artery.  There is a greater than 80% calcified lesion in the left external iliac artery.   Left Lower Extremity: The left common femoral and profundofemoral artery are patent without significant stenosis.  There is a haziness at the origin of the superficial femoral artery however this does not appear to be clinically significant.  The superficial femoral artery is heavily calcified.  There is approximately 50% stenosis in the midportion.  There is a high-grade lesion in the popliteal artery just above the joint space, greater than 80%.  The below-knee popliteal artery is widely patent.  There is three-vessel runoff to the ankle with the posterior tibial being the dominant vessel across the ankle however the dorsalis pedis also crosses the ankle. Intervention: After the above images were acquired the decision was made to proceed with intervention.  A 6 French sheath was placed into the superficial femoral artery.  The patient was fully heparinized.  A 035 Glidewire and a quick cross catheter was used to cross the lesions.  A selective injection with a cath in the popliteal artery was performed confirming successful crossing of the lesion.  The lesion was then predilated with a 5 mm balloon.  I then placed an overlapping 6 x 150, 6 x 150, and a 6 x 40 Eluvia stent that were postdilated with a 5 mm balloon.  Completion imaging revealed resolution of the stenosis to less than 10%.  There was no change in runoff. Attention was then turned towards the left external iliac artery.  I did pullback pressures across the external lesion and there was a greater than 40 point gradient.  I did not get a significant gradient change across the common iliac artery.  I elected to stent the external iliac with an 8 x 40 Cook Zilver PTX which was postdilated with a 7 mm balloon.  Follow-up imaging revealed resolution of the stenosis.  At this point catheters and wires were removed.  The long sheath was exchanged out for a short 6  French sheath and a Celt was used for closure. Impression:  #1  Nearly occlusive lesion in the  left popliteal artery just proximal to the joint space.  Calcific disease extended into the superficial femoral artery and so overlapping 6 mm stents Eluvia stents were used to treat the lesions  #2  Greater than 80% stenosis in the left external iliac artery successfully treated using a Cook Zilver PTX 8 x 40 stent  #3  Three-vessel runoff to the ankle.  The posterior tibial is the dominant vessel out of the foot however the anterior tibial does cross the ankle. ALONSO Malvina New, M.D., FACS [facility] Office: [phone] Pager:  [phone]    Recent Labs: Lab Results  Component Value Date   WBC 9.0 12/09/2023   HGB 12.9 (L) 12/23/2023   PLT 390.0 12/09/2023   NA 134 (L) 12/23/2023   K 4.0 12/23/2023   CL 96 (L) 12/23/2023   CO2 33 (H) 12/09/2023   GLUCOSE 189 (H) 12/23/2023   BUN 14 12/23/2023   CREATININE 0.70 12/23/2023   BILITOT 0.6 12/09/2023   ALKPHOS 202 (H) 12/09/2023   AST 26 12/09/2023   ALT 30 12/09/2023   PROT 6.5 12/09/2023   ALBUMIN 3.1 (L) 12/09/2023   CALCIUM  8.8 12/09/2023   GFRAA >60 11/27/2018    Speciality Comments: No specialty comments available.  Procedures:  No procedures performed Allergies: Chantix [varenicline]   Assessment / Plan:     Visit Diagnoses: Rheumatoid arthritis involving multiple sites with positive rheumatoid factor (HCC) - Plan: Rheumatoid factor, Sedimentation rate, C-reactive protein Seropositive, nodular rheumatoid arthritis appears well-controlled with leflunomide  and prednisone . No active joint inflammation. Left elbow nodules present without pain or significant swelling. Surgical intervention not recommended due to infection risk. Enbrel  held for two months due to toe ulcer, no significant change in symptoms. Enbrel  can be restarted if needed. - Continue leflunomide  20 mg daily and prednisone  2 mg daily. - Hold Enbrel  unless developing new flares or unable to maintain symptoms on 5mg  prednisone  or  less. - Ordered blood tests to assess rheumatoid arthritis activity. - Deferred routine monitoring to PACE team (3 month interval recommended with leflunomide  long term).  High risk medication use Chronic obstructive pulmonary disease (COPD) COPD with wheezing and crackles on the left side, likely related to smoking history. No acute exacerbation noted.  Chronic ulcer of toe Chronic ulcer improving with current management. Enbrel  held due to infection risk. - Continue current wound care regimen with dressing and antibiotic ointment.          Orders: Orders Placed This Encounter  Procedures   Rheumatoid factor   Sedimentation rate   C-reactive protein   No orders of the defined types were placed in this encounter.   Follow-Up Instructions: Return if symptoms worsen or fail to improve.   Lonni LELON Ester, MD  Note - This record has been created using [employer].  Chart creation errors have been sought, but may not always  have been located. Such creation errors do not reflect on  the standard of medical care.

## 2024-01-13 NOTE — Patient Instructions (Signed)
 SABRA

## 2024-01-14 LAB — SEDIMENTATION RATE: Sed Rate: 2 mm/h (ref 0–20)

## 2024-01-14 LAB — RHEUMATOID FACTOR: Rheumatoid fact SerPl-aCnc: 257 [IU]/mL — ABNORMAL HIGH (ref <14)

## 2024-01-14 LAB — C-REACTIVE PROTEIN: CRP: 11.7 mg/L — ABNORMAL HIGH (ref <8.0)

## 2024-01-26 NOTE — Progress Notes (Unsigned)
 01/28/2024 Brian Zavala [id number] March 13, 1950  Referring provider: Cloria Annabella CROME, DO Primary GI doctor: Dr. Charlanne  ASSESSMENT AND PLAN:  Nausea with associated epigastric pain, sweating, worse in late afternoon, not associated with food or meds, sporadic 1-2 x a week, improved with linzess 2015 GES normal 04/2023 EGD from 10/2023 CTA without focal stenosis at SMA/celiac, unremarkable otherwise 12/2023 cortisol negative, thyroid, lipase normal 12/2023 normal GES He has been on the linzess 290 mcg daily, has not had another episode since 10/22.  -consider motegrity if symptoms return - consider US  mesentary arteries but no weight loss and CTA with no focal stenosis but patient is very much vasculopath  Elevated alkaline phosphatase Status post cholecystectomy 08/18/2023 CTAP W unremarkable liver, pancreas, spleen 01/09/2024 alk phos 157, AST 17 ALT 15 total bilirubin 0.4 albumin 3.3 GGT 72 - rechecking labs next week for HFP and GGT - Get elastography if abnormal, if normal possible from recent toe infection  Constipation BM every other day, mild straining - Increase fiber/ water intake -continue the Linzess 290 -consider Motegrety if any worsening symptoms  Iron deficiency anemia 12/09/2023  HGB 12.9 MCV 78.9 Platelets 390.0 12/09/2023 Iron 44 Ferritin 81.7 B12 411 Recent Labs    04/21/23 0423 08/18/23 1814 10/26/23 2311 10/28/23 0348 10/29/23 0301 12/09/23 0925 12/23/23 0615  HGB 15.6 13.2 14.9 13.3 13.1 12.0* 12.9*  04/21/2023 colonoscopy colonoscopy good bowel prep normal TI, unremarkable no repeat colonoscopy due to age 82/17/2025 EGD normal esophagus, Z-line irregular, gastritis normal duodenum, Negative H pylori, metaplasia, barret's No GI bleeding Will recheck iron/ferritin at [facility] next week Consider VCE if abnormal  Type 2 diabetes Off ozempic, on insulin   Peripheral vascular disease status post right AKA 12/23/2023 status post femoral artery stent,  left external iliac artery stenting with Dr. Serene On Plavix   CVA July 2024  with carotid artery stenosis  RA Off Enbrel , continue to monitor with rheumotology   Screening colonoscopy 04/21/2023 colonoscopy good bowel prep normal TI, unremarkable no repeat colonoscopy due to age  COPD Not on oxygen  Patient Care Team: Cloria Annabella CROME, DO as PCP - General (Geriatric Medicine)  HISTORY OF PRESENT ILLNESS: 73 y.o. male with a past medical history of hypertension, diabetes type 2, peripheral vascular disease status post right AKA, carotid artery stenosis with CVA in July on dual antiplatelet therapy, RA and others listed below presents for evaluation of AB pain and nausea Presents from [facility].   I last saw the patient in the office 12/09/2023 for abdominal pain and nausea.  Discussed the use of AI scribe software for clinical note transcription with the patient, who gave verbal consent to proceed.  History of Present Illness   Brian Zavala is a 73 year old male who presents for follow-up on nausea and abdominal discomfort.  Since his last visit in October, he has not experienced nausea, abdominal discomfort, or sweating. He attributes this improvement to the use of Linzess, which he takes daily, although he is unsure of the exact dosage. His bowel movements have improved, with increased frequency but no significant changes in stool consistency. No weight loss, dark black stool, or blood in the stool.  His CRP levels remain elevated, and he is scheduled for lab work next week to check iron levels, liver function, and GGT. Previous labs indicated low-normal iron, good ferritin, and slightly low saturations. He is currently taking B12 supplements.  Regarding his rheumatoid arthritis, he reports mild inflammation but feels he is managing  well without Enbrel , which he has not taken for a few weeks. No shortness of breath or chest pain.  He has stopped taking Ozempic, which he believes may  have contributed to his previous symptoms. He also mentions a past infection for which he stopped antibiotics, and he feels he has been doing well without them.  No alcohol consumption.        He  reports that he has been smoking cigarettes. He started smoking about 58 years ago. He has a 58 pack-year smoking history. He has never used smokeless tobacco. He reports that he does not drink alcohol and does not use drugs.  RELEVANT GI HISTORY, IMAGING AND LABS: Results   LABS CRP: Elevated Rheumatoid factor: Elevated Iron: Low normal Ferritin: Normal Iron saturation: Slightly low Alkaline phosphatase: Slightly elevated GGT: Elevated  RADIOLOGY Liver CT: Normal  DIAGNOSTIC Gastric emptying: Normal (12/2023)      CBC    Component Value Date/Time   WBC 9.0 12/09/2023 0925   RBC 4.62 12/09/2023 0925   HGB 12.9 (L) 12/23/2023 0615   HGB 14.9 05/02/2017 1408   HCT 38.0 (L) 12/23/2023 0615   HCT 43.3 05/02/2017 1408   PLT 390.0 12/09/2023 0925   PLT 235 05/02/2017 1408   MCV 78.9 12/09/2023 0925   MCV 85 05/02/2017 1408   MCH 26.4 10/29/2023 0301   MCHC 33.0 12/09/2023 0925   RDW 16.6 (H) 12/09/2023 0925   RDW 14.6 05/02/2017 1408   LYMPHSABS 0.7 12/09/2023 0925   LYMPHSABS 1.4 05/02/2017 1408   MONOABS 0.7 12/09/2023 0925   EOSABS 0.1 12/09/2023 0925   EOSABS 0.2 05/02/2017 1408   BASOSABS 0.1 12/09/2023 0925   BASOSABS 0.0 05/02/2017 1408   Recent Labs    04/21/23 0423 08/18/23 1814 10/26/23 2311 10/28/23 0348 10/29/23 0301 12/09/23 0925 12/23/23 0615  HGB 15.6 13.2 14.9 13.3 13.1 12.0* 12.9*    CMP     Component Value Date/Time   NA 134 (L) 12/23/2023 0615   NA 138 05/02/2017 1408   K 4.0 12/23/2023 0615   CL 96 (L) 12/23/2023 0615   CO2 33 (H) 12/09/2023 0925   GLUCOSE 189 (H) 12/23/2023 0615   BUN 14 12/23/2023 0615   BUN 8 05/02/2017 1408   CREATININE 0.70 12/23/2023 0615   CREATININE 0.84 12/04/2017 1446   CALCIUM  8.8 12/09/2023 0925    CALCIUM  8.6 05/29/2023 0000   PROT 6.5 12/09/2023 0925   PROT 6.8 08/22/2022 1326   ALBUMIN 3.1 (L) 12/09/2023 0925   ALBUMIN 4.0 08/22/2022 1326   AST 26 12/09/2023 0925   ALT 30 12/09/2023 0925   ALKPHOS 202 (H) 12/09/2023 0925   BILITOT 0.6 12/09/2023 0925   BILITOT 0.4 08/22/2022 1326   GFRNONAA >60 10/30/2023 0208   GFRAA >60 11/27/2018 0931      Latest Ref Rng & Units 12/09/2023    9:25 AM 10/29/2023    3:01 AM 10/26/2023   11:11 PM  Hepatic Function  Total Protein 6.0 - 8.3 g/dL 6.5  6.0  7.6   Albumin 3.5 - 5.2 g/dL 3.1  2.3  3.0   AST 0 - 37 U/L 26  20  20    ALT 0 - 53 U/L 30  16  18    Alk Phosphatase 39 - 117 U/L 202  64  83   Total Bilirubin 0.2 - 1.2 mg/dL 0.6  0.6  1.0       Current Medications:   Current Outpatient Medications (Endocrine & Metabolic):  insulin  aspart (NOVOLOG ) 100 UNIT/ML injection, Inject 2-12 Units into the skin 4 (four) times daily -  before meals and at bedtime. Inject 2-12 units subcutaneously based on sliding scale:  131-180   2 units 181-240   4 units 241-300   6 units 301-350   8 units 351-400   10 units >400        12 units   insulin  glargine (SEMGLEE ) 100 UNIT/ML injection, Inject 0.7 mLs (70 Units total) into the skin daily.   metFORMIN  (GLUCOPHAGE -XR) 500 MG 24 hr tablet, Take 1,000 mg by mouth 2 (two) times daily with a meal.   predniSONE  (DELTASONE ) 1 MG tablet, Take 2 mg by mouth daily with breakfast.   Glucagon  (BAQSIMI  ONE PACK) 3 MG/DOSE POWD, Place 1 Device into the nose as needed (Low blood sugar with impaired consciousness). (Patient not taking: Reported on 01/13/2024)  Current Outpatient Medications (Cardiovascular):    atorvastatin  (LIPITOR ) 80 MG tablet, Take 1 tablet (80 mg total) by mouth daily at 6 PM.   hydrochlorothiazide  (MICROZIDE ) 12.5 MG capsule, Take 12.5 mg by mouth daily.   metoprolol  succinate (TOPROL -XL) 25 MG 24 hr tablet, Take 25 mg by mouth at bedtime.  Current Outpatient Medications (Respiratory):     promethazine  (PHENERGAN ) 25 MG tablet, Take 25 mg by mouth every 8 (eight) hours as needed for nausea or vomiting.   oxymetazoline  (AFRIN) 0.05 % nasal spray, Place 1 spray into both nostrils 2 (two) times daily as needed for congestion.  Current Outpatient Medications (Analgesics):    aspirin  81 MG chewable tablet, Chew 81 mg by mouth in the morning.   etanercept  (ENBREL ) 50 MG/ML injection, Inject 50 mg into the skin every Sunday.   leflunomide  (ARAVA ) 20 MG tablet, Take 20 mg by mouth daily.   morphine  (MS CONTIN ) 100 MG 12 hr tablet, Take 100 mg by mouth every 12 (twelve) hours.  Current Outpatient Medications (Hematological):    clopidogrel  (PLAVIX ) 75 MG tablet, Take 1 tablet (75 mg total) by mouth daily.   Cyanocobalamin  (VITAMIN B-12 PO), Take 1 tablet by mouth daily.  Current Outpatient Medications (Other):    calcium  carbonate (OSCAL) 1500 (600 Ca) MG TABS tablet, Take 600 mg of elemental calcium  by mouth daily with breakfast.    linaclotide (LINZESS) 290 MCG CAPS capsule, Take 290 mcg by mouth daily before breakfast.   metoCLOPramide  (REGLAN ) 5 MG tablet, Take 5 mg by mouth 4 (four) times daily as needed for nausea or vomiting.   ondansetron  (ZOFRAN ) 4 MG tablet, Take 1 tablet (4 mg total) by mouth every 6 (six) hours.   pregabalin  (LYRICA ) 150 MG capsule, Take 150 mg by mouth 2 (two) times daily.   pantoprazole  (PROTONIX ) 40 MG tablet, Take 1 tablet (40 mg total) by mouth daily before breakfast. (Patient taking differently: Take 40 mg by mouth daily as needed (indigestion/heartburn.).)   triamcinolone cream (KENALOG) 0.1 %, Apply 1 Application topically daily as needed (Rash). (Patient not taking: Reported on 01/28/2024)  Medical History:  Past Medical History:  Diagnosis Date   Arthritis    pretty much all over   Cellulitis of left foot    Colitis    COPD (chronic obstructive pulmonary disease) (HCC)    Dyspnea    Hx of BKA, right (HCC)    Hyperlipidemia     Hypertension    Nausea and vomiting 08/20/2022   Neuromuscular disorder (HCC)    neuropathy legs   Neuropathy    legs   Osteomyelitis (HCC) 04/2018  4th toe left foot   Osteomyelitis of fourth toe of left foot (HCC) 04/19/2018   Peripheral vascular disease    Rheumatoid arthritis (HCC)    Sleep apnea    does not use a cpap   Stroke (HCC)    Type II diabetes mellitus (HCC)    Wears glasses    Wears partial dentures    top and bottom partials   Allergies:  Allergies  Allergen Reactions   Chantix [Varenicline] Nausea Only     Surgical History:  He  has a past surgical history that includes Leg amputation below knee (Right, 2005); Lumbar disc surgery (82,90); Toe amputation (Right, 2005); Foot Amputation (Right, 2005); Scar revision (Right, 2005); Cyst excision perineal (N/A, 12/08/2012); Cholecystectomy (N/A, 10/26/2013); Incise and drain abcess (12/02/2014); Back surgery; Incision and drainage perirectal abscess (Left, 12/02/2014); Cyst excision (Bilateral, 02/15/2015); Colonoscopy; I & D extremity (Right, 03/07/2017); Lower Extremity Angiography (N/A, 05/05/2017); PERIPHERAL VASCULAR INTERVENTION (Right, 05/05/2017); Amputation (Left, 04/22/2018); Stump revision (Right, 08/21/2018); Stump revision (Right, 09/09/2018); ABDOMINAL AORTOGRAM W/LOWER EXTREMITY (Bilateral, 10/19/2018); PERIPHERAL VASCULAR INTERVENTION (Right, 10/19/2018); Above knee leg amputaton (Right, 11/27/2018); Amputation (Right, 11/27/2018); Transcarotid artery revascularization (Left, 09/04/2022); Ultrasound guidance for vascular access (Right, 09/04/2022); Esophagogastroduodenoscopy (egd) with propofol  (N/A, 04/21/2023); Colonoscopy with propofol  (N/A, 04/21/2023); biopsy (04/21/2023); ABDOMINAL AORTOGRAM (N/A, 12/23/2023); Lower Extremity Angiography (N/A, 12/23/2023); and LOWER EXTREMITY INTERVENTION (N/A, 12/23/2023). Family History:  His family history includes Cirrhosis in his father; Diabetes in his maternal grandmother and mother;  Hypothyroidism in his mother; Thyroid disease in his daughter.  REVIEW OF SYSTEMS  : All other systems reviewed and negative except where noted in the History of Present Illness.  PHYSICAL EXAM: BP 120/68   Pulse (!) 47  Physical Exam   GENERAL APPEARANCE: chronically ill appearing, in no apparent distress. HEENT: No cervical lymphadenopathy, unremarkable thyroid, sclerae anicteric, conjunctiva pink, poor dentition RESPIRATORY: Respiratory effort normal, wheezing bilateral lungs CARDIO: Regular rate and rhythm with no murmurs, rubs, or gallops, peripheral pulses intact. ABDOMEN: Soft, non-distended,obese, active bowel sounds in all four quadrants, non-tender to palpation, no rebound, no mass appreciated. RECTAL: Declines. MUSCULOSKELETAL: s/p right AKA, in wheelchair, atrophy present globally SKIN: Dry, intact without rashes or lesions. No jaundice. NEURO: Alert, oriented, no focal deficits. PSYCH: Cooperative, normal mood and affect.      Alan JONELLE Coombs, PA-C 10:39 AM

## 2024-01-28 ENCOUNTER — Encounter: Payer: Self-pay | Admitting: Physician Assistant

## 2024-01-28 ENCOUNTER — Ambulatory Visit: Payer: Medicare (Managed Care) | Admitting: Physician Assistant

## 2024-01-28 VITALS — BP 120/68 | HR 47

## 2024-01-28 DIAGNOSIS — I6523 Occlusion and stenosis of bilateral carotid arteries: Secondary | ICD-10-CM

## 2024-01-28 DIAGNOSIS — R110 Nausea: Secondary | ICD-10-CM | POA: Diagnosis not present

## 2024-01-28 DIAGNOSIS — D649 Anemia, unspecified: Secondary | ICD-10-CM

## 2024-01-28 DIAGNOSIS — R1033 Periumbilical pain: Secondary | ICD-10-CM | POA: Diagnosis not present

## 2024-01-28 DIAGNOSIS — R748 Abnormal levels of other serum enzymes: Secondary | ICD-10-CM | POA: Diagnosis not present

## 2024-01-28 DIAGNOSIS — I6340 Cerebral infarction due to embolism of unspecified cerebral artery: Secondary | ICD-10-CM

## 2024-01-28 DIAGNOSIS — I739 Peripheral vascular disease, unspecified: Secondary | ICD-10-CM

## 2024-01-28 NOTE — Patient Instructions (Signed)
 VISIT SUMMARY:  You came in for a follow-up on your nausea and abdominal discomfort. Your symptoms have improved with the use of Linzess, and you have not experienced any recent nausea, abdominal discomfort, or sweating. We discussed your elevated liver enzymes, possible iron deficiency, and rheumatoid arthritis management.  YOUR PLAN:  CONSTIPATION: Your symptoms have improved with Linzess, and you have not experienced any recent nausea, abdominal discomfort, or sweating. Your bowel movements have increased in frequency. -Continue taking Linzess daily.  ELEVATED LIVER ENZYMES: Previous labs showed slightly elevated liver enzymes. Your liver CT was normal, but we need to monitor your liver function. -Recheck liver function tests next week. -Consider elastography if liver function tests remain abnormal.  IRON DEFICIENCY (POSSIBLE ANEMIA): Your iron levels were low normal with slightly low saturations. Ferritin was normal, and there were no signs of dark or bloody stools. -Recheck iron levels next week. -Consider iron supplementation if iron levels are low. -Consider capsule endoscopy if iron levels are low.  RHEUMATOID ARTHRITIS: You are managing well without Enbrel , although some mild inflammation is noted. -Continue monitoring your symptoms and managing without Enbrel  for now.  WHEEZING: Wheezing was noted during the examination, but you have no shortness of breath or chest pain. -Monitor for any changes in symptoms and report if you experience shortness of breath or chest pain.

## 2024-02-05 ENCOUNTER — Encounter: Payer: Self-pay | Admitting: Podiatry

## 2024-02-05 ENCOUNTER — Ambulatory Visit: Payer: Medicare (Managed Care) | Admitting: Podiatry

## 2024-02-05 DIAGNOSIS — I739 Peripheral vascular disease, unspecified: Secondary | ICD-10-CM

## 2024-02-05 DIAGNOSIS — M79675 Pain in left toe(s): Secondary | ICD-10-CM | POA: Diagnosis not present

## 2024-02-05 DIAGNOSIS — B351 Tinea unguium: Secondary | ICD-10-CM

## 2024-02-05 DIAGNOSIS — L97529 Non-pressure chronic ulcer of other part of left foot with unspecified severity: Secondary | ICD-10-CM

## 2024-02-05 NOTE — Progress Notes (Unsigned)
 Chief Complaint  Patient presents with   Wound Check    Pt feels wound has improved a little. Has been cleaning and applying betadine . Diabetic A1c 7.5, Plavix , 81 mg Asprin    HPI: 73 y.o. male presents today accompanied by sister who is a engineer, civil (consulting) and assist with his care.  Follows up today for dorsal second toe wound and left first toe wound. Wounds overall appear stable from previous.  He recently underwent left lower extremity angiogram with SFA and popliteal stenting, external iliac stenting.  He has upcoming follow-up vascular testing.  History of right side above-knee amputation.  Also requesting treatment of painful, thickened, elongated toenails left lower extremity that are dystrophic in appearance and causing pain.  Denies any nausea, vomiting, fever, chills, chest pain, shortness of breath.  Past Medical History:  Diagnosis Date   Arthritis    pretty much all over   Cellulitis of left foot    Colitis    COPD (chronic obstructive pulmonary disease) (HCC)    Dyspnea    Hx of BKA, right (HCC)    Hyperlipidemia    Hypertension    Nausea and vomiting 08/20/2022   Neuromuscular disorder (HCC)    neuropathy legs   Neuropathy    legs   Osteomyelitis (HCC) 04/2018   4th toe left foot   Osteomyelitis of fourth toe of left foot (HCC) 04/19/2018   Peripheral vascular disease    Rheumatoid arthritis (HCC)    Sleep apnea    does not use a cpap   Stroke (HCC)    Type II diabetes mellitus (HCC)    Wears glasses    Wears partial dentures    top and bottom partials    Past Surgical History:  Procedure Laterality Date   ABDOMINAL AORTOGRAM N/A 12/23/2023   Procedure: ABDOMINAL AORTOGRAM;  Surgeon: Serene Gaile ORN, MD;  Location: [facility];  Service: Cardiovascular;  Laterality: N/A;   ABDOMINAL AORTOGRAM W/LOWER EXTREMITY Bilateral 10/19/2018   Procedure: ABDOMINAL AORTOGRAM W/LOWER EXTREMITY;  Surgeon: Court Dorn PARAS, MD;  Location: [facility];   Service: Cardiovascular;  Laterality: Bilateral;   ABOVE KNEE LEG AMPUTATION Right 11/27/2018   AMPUTATION Left 04/22/2018   Procedure: LEFT FOOT 4TH RAY AMPUTATION;  Surgeon: Harden Jerona GAILS, MD;  Location: [facility] OR;  Service: Orthopedics;  Laterality: Left;   AMPUTATION Right 11/27/2018   Procedure: RIGHT ABOVE KNEE AMPUTATION;  Surgeon: Harden Jerona GAILS, MD;  Location: [facility] OR;  Service: Orthopedics;  Laterality: Right;   BACK SURGERY     BIOPSY  04/21/2023   Procedure: BIOPSY;  Surgeon: Charlanne Groom, MD;  Location: [facility];  Service: Gastroenterology;;   CHOLECYSTECTOMY N/A 10/26/2013   Procedure: LAPAROSCOPIC CHOLECYSTECTOMY WITH INTRAOPERATIVE CHOLANGIOGRAM;  Surgeon: Debby LABOR. Cornett, MD;  Location: [facility];  Service: General;  Laterality: N/A;   COLONOSCOPY     COLONOSCOPY WITH PROPOFOL  N/A 04/21/2023   Procedure: COLONOSCOPY WITH PROPOFOL ;  Surgeon: Charlanne Groom, MD;  Location: [facility];  Service: Gastroenterology;  Laterality: N/A;   CYST EXCISION Bilateral 02/15/2015   Procedure: LEFT INDEX FINGER AND RIGHT MIDDLE FINGER NODULE EXCISION;  Surgeon: Kay CHRISTELLA Cummins, MD;  Location: [facility];  Service: Orthopedics;  Laterality: Bilateral;   CYST EXCISION PERINEAL N/A 12/08/2012   Procedure: CYST EXCISION PERINeum;  Surgeon: Debby A. Cornett, MD;  Location: [facility];  Service: General;  Laterality: N/A;   ESOPHAGOGASTRODUODENOSCOPY (EGD) WITH PROPOFOL  N/A 04/21/2023  Procedure: ESOPHAGOGASTRODUODENOSCOPY (EGD) WITH PROPOFOL ;  Surgeon: Charlanne Groom, MD;  Location: [facility];  Service: Gastroenterology;  Laterality: N/A;   FOOT AMPUTATION Right 2005   I & D EXTREMITY Right 03/07/2017   Procedure: EXCISION FIBULAR HEAD RIGHT BELOW KNEE AMPUTATION;  Surgeon: Harden Jerona GAILS, MD;  Location[facility];  Service: Orthopedics;  Laterality: Right;   INCISE AND DRAIN ABCESS  12/02/2014   PERINEAL ABSCESS   INCISION AND DRAINAGE PERIRECTAL ABSCESS Left 12/02/2014    Procedure: IRRIGATION AND DEBRIDEMENT PERINEAL ABSCESS;  Surgeon: Vicenta Poli, MD;  Location: [facility];  Service: General;  Laterality: Left;   LEG AMPUTATION BELOW KNEE Right 2005   LOWER EXTREMITY ANGIOGRAPHY N/A 05/05/2017   Procedure: LOWER EXTREMITY ANGIOGRAPHY;  Surgeon: Court Dorn PARAS, MD;  [facility]: [facility];  Service: Cardiovascular;  Laterality: N/A;   LOWER EXTREMITY ANGIOGRAPHY N/A 12/23/2023   Procedure: Lower Extremity Angiography;  Surgeon: Serene Gaile ORN, MD;  Location: [facility];  Service: Cardiovascular;  Laterality: N/A;   LOWER EXTREMITY INTERVENTION N/A 12/23/2023   Procedure: LOWER EXTREMITY INTERVENTION;  Surgeon: Serene Gaile ORN, MD;  Location: [facility];  Service: Cardiovascular;  Laterality: N/A;   LUMBAR DISC SURGERY  82,90   ruptured disc   PERIPHERAL VASCULAR INTERVENTION Right 05/05/2017   Procedure: PERIPHERAL VASCULAR INTERVENTION;  Surgeon: Court Dorn PARAS, MD;  Location: [facility];  Service: Cardiovascular;  Laterality: Right;   PERIPHERAL VASCULAR INTERVENTION Right 10/19/2018   Procedure: PERIPHERAL VASCULAR INTERVENTION;  Surgeon: Court Dorn PARAS, MD;  Location: [facility];  Service: Cardiovascular;  Laterality: Right;   SCAR REVISION Right 2005   @ amputation   STUMP REVISION Right 08/21/2018   Procedure: REVISION RIGHT BELOW KNEE AMPUTATION, EXCISION FIBULA;  Surgeon: Harden Jerona GAILS, MD;  Location: [facility];  Service: Orthopedics;  Laterality: Right;   STUMP REVISION Right 09/09/2018   Procedure: REVISION RIGHT BELOW KNEE AMPUTATION;  Surgeon: Harden Jerona GAILS, MD;  Location: [facility] OR;  Service: Orthopedics;  Laterality: Right;   TOE AMPUTATION Right 2005   TRANSCAROTID ARTERY REVASCULARIZATION  Left 09/04/2022   Procedure: Left Transcarotid Artery Revascularization;  Surgeon: Serene Gaile ORN, MD;  Location: [facility] OR;  Service: Vascular;  Laterality: Left;   ULTRASOUND GUIDANCE FOR VASCULAR ACCESS Right 09/04/2022    Procedure: ULTRASOUND GUIDANCE FOR VASCULAR ACCESS, RIGHT FEMORAL VEIN;  Surgeon: Serene Gaile ORN, MD;  Location: [facility];  Service: Vascular;  Laterality: Right;    Allergies  Allergen Reactions   Chantix [Varenicline] Nausea Only    ROS denies any nausea, vomiting, fever, chills, chest pain, shortness of breath   Physical Exam: There were no vitals filed for this visit.  General: The patient is alert and oriented x3 in no acute distress.  Dermatology: Pedal skin thin and atrophic.  Left second toe dorsal PIPJ ulceration scabbed over, stable appearing.  First toe distal aspect overlying hyperkeratotic tissue with evidence of tissue loss.  Remaining nail plates x 4 left foot are thickened, elongated dystrophic with yellow discoloration subungual bree.  They are tender on direct dorsal palpation.   Vascular: Left foot DP and PT pulses non palpable.  Cap refill approximately 3-5 seconds to the digits.  Pedal hair growth absent.  Skin temperature gradient warm to cool  Neurological: Protective sensation decreased.  Musculoskeletal Exam: Prior left foot fourth toe amputation.  Right above-knee amputation.  Mild hammertoes of remaining digits left foot  Assessment/Plan of Care: 1. PAD (peripheral artery  disease)   2. 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  None  # Ischemic ulcer left 1st and 2nd toe -Status post angiogram with stenting 10/21 with Dr. Serene - Continue daily Betadine  paint/wet to dry gauze dressings - Closely monitor for signs of infection - Will await results of vascular testing before proceeding with excisional debridement.  #Onychomycosis with pain  -Nails palliatively debrided as below. -Educated on self-care -prior right side AKA  Procedure: Nail Debridement Rationale: Pain Type of Debridement: manual, sharp debridement. Instrumentation: Nail nipper, rotary  burr. Number of Nails: 4     Shanira Tine L. Lamount MAUL, AACFAS [facility]     [address] [place], [place] [address]                Office [phone]  Fax [phone]

## 2024-02-09 ENCOUNTER — Ambulatory Visit (HOSPITAL_COMMUNITY)
Admit: 2024-02-09 | Discharge: 2024-02-09 | Disposition: A | Payer: Medicare (Managed Care) | Attending: Surgery | Admitting: Surgery

## 2024-02-09 ENCOUNTER — Ambulatory Visit: Payer: Medicare (Managed Care) | Admitting: Surgery

## 2024-02-09 ENCOUNTER — Ambulatory Visit (HOSPITAL_COMMUNITY)
Admission: RE | Admit: 2024-02-09 | Discharge: 2024-02-09 | Disposition: A | Payer: Medicare (Managed Care) | Attending: Surgery | Admitting: Surgery

## 2024-02-09 ENCOUNTER — Encounter: Payer: Self-pay | Admitting: Surgery

## 2024-02-09 VITALS — BP 152/82 | HR 87 | Temp 97.8°F

## 2024-02-09 DIAGNOSIS — I70245 Atherosclerosis of native arteries of left leg with ulceration of other part of foot: Secondary | ICD-10-CM

## 2024-02-09 DIAGNOSIS — I70229 Atherosclerosis of native arteries of extremities with rest pain, unspecified extremity: Secondary | ICD-10-CM

## 2024-02-09 DIAGNOSIS — I70248 Atherosclerosis of native arteries of left leg with ulceration of other part of lower left leg: Secondary | ICD-10-CM

## 2024-02-09 DIAGNOSIS — I70213 Atherosclerosis of native arteries of extremities with intermittent claudication, bilateral legs: Secondary | ICD-10-CM

## 2024-02-09 NOTE — Progress Notes (Signed)
 [facility] [place]  Patient name: Brian Zavala MRN: [id number] DOB: 02-Jun-1950 Sex: male   REASON FOR VISIT:    Follow up  HISOTRY OF PRESENT ILLNESS:   Brian Zavala is a 73 y.o. male who presented to the hospital on 09/01/2022 with right eye vision loss.  His workup revealed a infarct at the left occipital lobe.  CT scan revealed high-grade bilateral carotid stenosis.  On 09/04/2022, he underwent left-sided TCAR.  Intraoperative images revealed 80% stenosis that was resolved after stenting.  He was discharged home the following day.  He has had no new neurologic symptoms and his vision is about back to baseline.     He developed a left foot ulcer and underwent angiography on 12/23/2023.  He was found to have a nearly occlusive lesion in the left popliteal artery.  Overlapping 6 mm Eluvia stents were used to treat the lesion.  He also had a greater than 80% left external iliac stenosis treated with a silver 8 x 40 he denies claudication symptoms.       The patient has an extensive history of peripheral vascular disease and has undergone percutaneous intervention by Dr. Court in the past.  He has had a right above-knee amputation.  He denies any symptoms in his left leg such as claudication, or ulceration.     PAST MEDICAL HISTORY:   Past Medical History:  Diagnosis Date   Arthritis    pretty much all over   Cellulitis of left foot    Colitis    COPD (chronic obstructive pulmonary disease) (HCC)    Dyspnea    Hx of BKA, right (HCC)    Hyperlipidemia    Hypertension    Nausea and vomiting 08/20/2022   Neuromuscular disorder (HCC)    neuropathy legs   Neuropathy    legs   Osteomyelitis (HCC) 04/2018   4th toe left foot   Osteomyelitis of fourth toe of left foot (HCC) 04/19/2018   Peripheral vascular disease    Rheumatoid arthritis (HCC)    Sleep apnea    does not use a cpap   Stroke (HCC)    Type II  diabetes mellitus (HCC)    Wears glasses    Wears partial dentures    top and bottom partials     FAMILY HISTORY:   Family History  Problem Relation Age of Onset   Hypothyroidism Mother    Diabetes Mother    Cirrhosis Father    Diabetes Maternal Grandmother    Thyroid disease Daughter     SOCIAL HISTORY:   Social History   Tobacco Use   Smoking status: Every Day    Current packs/day: 0.50    Average packs/day: 1 pack/day for 58.9 years (58.0 ttl pk-yrs)    Types: Cigarettes    Start date: 03/04/1965   Smokeless tobacco: Never  Substance Use Topics   Alcohol use: No     ALLERGIES:   Allergies  Allergen Reactions   Chantix [Varenicline] Nausea Only     CURRENT MEDICATIONS:   Current Outpatient Medications  Medication Sig Dispense Refill   aspirin  81 MG chewable tablet Chew 81 mg by mouth in the morning.     atorvastatin  (LIPITOR ) 80 MG tablet Take 1 tablet (80 mg total) by mouth daily at 6 PM. 30 tablet 2   calcium  carbonate (OSCAL) 1500 (600 Ca) MG TABS tablet Take 600 mg of elemental calcium  by mouth daily with breakfast.  clopidogrel  (PLAVIX ) 75 MG tablet Take 1 tablet (75 mg total) by mouth daily. 30 tablet 0   Cyanocobalamin  (VITAMIN B-12 PO) Take 1 tablet by mouth daily.     etanercept  (ENBREL ) 50 MG/ML injection Inject 50 mg into the skin every Sunday.     hydrochlorothiazide (MICROZIDE) 12.5 MG capsule Take 12.5 mg by mouth daily.     insulin aspart (NOVOLOG) 100 UNIT/ML injection Inject 2-12 Units into the skin 4 (four) times daily -  before meals and at bedtime. Inject 2-12 units subcutaneously based on sliding scale:  131-180   2 units 181-240   4 units 241-300   6 units 301-350   8 units 351-400   10 units >400        12  units     insulin  glargine (SEMGLEE ) 100 UNIT/ML injection Inject 0.7 mLs (70 Units total) into the skin daily. 63 mL 1   leflunomide  (ARAVA ) 20 MG tablet Take 20 mg by mouth daily.     linaclotide (LINZESS) 290 MCG CAPS  capsule Take 290 mcg by mouth daily before breakfast.     metFORMIN  (GLUCOPHAGE -XR) 500 MG 24 hr tablet Take 1,000 mg by mouth 2 (two) times daily with a meal.     metoCLOPramide  (REGLAN ) 5 MG tablet Take 5 mg by mouth 4 (four) times daily as needed for nausea or vomiting.     metoprolol  succinate (TOPROL -XL) 25 MG 24 hr tablet Take 25 mg by mouth at bedtime.     morphine  (MS CONTIN ) 100 MG 12 hr tablet Take 100 mg by mouth every 12 (twelve) hours.     ondansetron  (ZOFRAN ) 4 MG tablet Take 1 tablet (4 mg total) by mouth every 6 (six) hours. 12 tablet 0   oxymetazoline  (AFRIN) 0.05 % nasal spray Place 1 spray into both nostrils 2 (two) times daily as needed for congestion.     pantoprazole  (PROTONIX ) 40 MG tablet Take 1 tablet (40 mg total) by mouth daily before breakfast. (Patient taking differently: Take 40 mg by mouth daily as needed (indigestion/heartburn.).) 30 tablet 3   predniSONE  (DELTASONE ) 1 MG tablet Take 2 mg by mouth daily with breakfast.     pregabalin  (LYRICA ) 150 MG capsule Take 150 mg by mouth 2 (two) times daily.     promethazine  (PHENERGAN ) 25 MG tablet Take 25 mg by mouth every 8 (eight) hours as needed for nausea or vomiting.     No current facility-administered medications for this visit.    REVIEW OF SYSTEMS:   [X]  denotes positive finding, [ ]  denotes negative finding Cardiac  Comments:  Chest pain or chest pressure:    Shortness of breath upon exertion:    Short of breath when lying flat:    Irregular heart rhythm:        Vascular    Pain in calf, thigh, or hip brought on by ambulation:    Pain in feet at night that wakes you up from your sleep:     Blood clot in your veins:    Leg swelling:         Pulmonary    Oxygen at home:    Productive cough:     Wheezing:         Neurologic    Sudden weakness in arms or legs:     Sudden numbness in arms or legs:     Sudden onset of difficulty speaking or slurred speech:    Temporary loss of vision in one eye:  Problems with dizziness:         Gastrointestinal    Blood in stool:     Vomited blood:         Genitourinary    Burning when urinating:     Blood in urine:        Psychiatric    Major depression:         Hematologic    Bleeding problems:    Problems with blood clotting too easily:        Skin    Rashes or ulcers:        Constitutional    Fever or chills:      PHYSICAL EXAM:   Vitals:   02/09/24 0953  BP: (!) 152/82  Pulse: 87  Temp: 97.8 F (36.6 C)  SpO2: 93%    GENERAL: The patient is a well-nourished male, in no acute distress. The vital signs are documented above. CARDIAC: There is a regular rate and rhythm.  PULMONARY: Non-labored respirations ABDOMEN: Soft and non-tender with normal pitched bowel sounds.  no major deformities or cyanosis. NEUROLOGIC: No focal weakness or paresthesias are detected. SKIN: Left great toe and second toe wounds are healing PSYCHIATRIC: The patient has a normal affect.  STUDIES:   I have reviewed the following: Aortoiliac: Abdominal Aorta: No evidence of an abdominal aortic aneurysm was  visualized. The largest aortic measurement is 2.1 cm. Severe  atherosclerosis noted throughout.  Stenosis: +-------------------+-------------+  Location           Stenosis       +-------------------+-------------+  Left External Iliac>50% stenosis  +-------------------+-------------+  Left EIA stent struts not seen. Limited visualization of CIA and EIA due  to overlying bowel gas and calcified shadowing plaque.    Lower extremity: Left: 30-49% stenosis noted in the superficial femoral artery. Patent  stent with no evidence of stenosis in the superficial femoral and  popliteal artery. Modearte to severe atherosclerosis noted throughout  extremity.   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  Duplex today shows external iliac stenosis but the left leg stents are  widely patent.  His wound is improving.  I will have him return in 3 months for surveillance imaging    Malvina New, IV, MD, FACS [facility] [phone] Pager [phone]

## 2024-02-10 LAB — VAS US ABI WITH/WO TBI: Left ABI: 1.31

## 2024-02-11 ENCOUNTER — Other Ambulatory Visit: Payer: Self-pay

## 2024-02-11 DIAGNOSIS — I70213 Atherosclerosis of native arteries of extremities with intermittent claudication, bilateral legs: Secondary | ICD-10-CM

## 2024-02-19 ENCOUNTER — Ambulatory Visit: Payer: Medicare (Managed Care) | Admitting: Podiatry

## 2024-02-19 ENCOUNTER — Encounter: Payer: Self-pay | Admitting: Podiatry

## 2024-02-19 DIAGNOSIS — Z89611 Acquired absence of right leg above knee: Secondary | ICD-10-CM

## 2024-02-19 DIAGNOSIS — I739 Peripheral vascular disease, unspecified: Secondary | ICD-10-CM | POA: Diagnosis not present

## 2024-02-19 DIAGNOSIS — L97529 Non-pressure chronic ulcer of other part of left foot with unspecified severity: Secondary | ICD-10-CM | POA: Diagnosis not present

## 2024-02-19 NOTE — Progress Notes (Signed)
 Chief Complaint  Patient presents with   Diabetic Ulcer    L 1st and 2nd toe wounds are healing. Wounds are closed. No drainage in last 3 days.     HPI: 73 y.o. male presents today accompanied by sister who is a engineer, civil (consulting) and assist with his care.  Follows up today for dorsal second toe wound and left first toe wound. Wounds do appear improved from previous. He recently underwent left lower extremity angiogram with SFA and popliteal stenting, external iliac stenting.     Denies any nausea, vomiting, fever, chills, chest pain, shortness of breath.  Past Medical History:  Diagnosis Date   Arthritis    pretty much all over   Cellulitis of left foot    Colitis    COPD (chronic obstructive pulmonary disease) (HCC)    Dyspnea    Hx of BKA, right (HCC)    Hyperlipidemia    Hypertension    Nausea and vomiting 08/20/2022   Neuromuscular disorder (HCC)    neuropathy legs   Neuropathy    legs   Osteomyelitis (HCC) 04/2018   4th toe left foot   Osteomyelitis of fourth toe of left foot (HCC) 04/19/2018   Peripheral vascular disease    Rheumatoid arthritis (HCC)    Sleep apnea    does not use a cpap   Stroke (HCC)    Type II diabetes mellitus (HCC)    Wears glasses    Wears partial dentures    top and bottom partials    Past Surgical History:  Procedure Laterality Date   ABDOMINAL AORTOGRAM N/A 12/23/2023   Procedure: ABDOMINAL AORTOGRAM;  Surgeon: Serene Gaile ORN, MD;  Location: [facility];  Service: Cardiovascular;  Laterality: N/A;   ABDOMINAL AORTOGRAM W/LOWER EXTREMITY Bilateral 10/19/2018   Procedure: ABDOMINAL AORTOGRAM W/LOWER EXTREMITY;  Surgeon: Court Dorn PARAS, MD;  Location: [facility];  Service: Cardiovascular;  Laterality: Bilateral;   ABOVE KNEE LEG AMPUTATION Right 11/27/2018   AMPUTATION Left 04/22/2018   Procedure: LEFT FOOT 4TH RAY AMPUTATION;  Surgeon: Harden Jerona GAILS, MD;  Location: [facility] OR;  Service: Orthopedics;  Laterality: Left;    AMPUTATION Right 11/27/2018   Procedure: RIGHT ABOVE KNEE AMPUTATION;  Surgeon: Harden Jerona GAILS, MD;  Location: [facility] OR;  Service: Orthopedics;  Laterality: Right;   BACK SURGERY     BIOPSY  04/21/2023   Procedure: BIOPSY;  Surgeon: Charlanne Groom, MD;  Location: [facility];  Service: Gastroenterology;;   CHOLECYSTECTOMY N/A 10/26/2013   Procedure: LAPAROSCOPIC CHOLECYSTECTOMY WITH INTRAOPERATIVE CHOLANGIOGRAM;  Surgeon: Debby LABOR. Cornett, MD;  Location: [facility];  Service: General;  Laterality: N/A;   COLONOSCOPY     COLONOSCOPY WITH PROPOFOL  N/A 04/21/2023   Procedure: COLONOSCOPY WITH PROPOFOL ;  Surgeon: Charlanne Groom, MD;  Location: [facility];  Service: Gastroenterology;  Laterality: N/A;   CYST EXCISION Bilateral 02/15/2015   Procedure: LEFT INDEX FINGER AND RIGHT MIDDLE FINGER NODULE EXCISION;  Surgeon: Kay CHRISTELLA Cummins, MD;  Location: [facility];  Service: Orthopedics;  Laterality: Bilateral;   CYST EXCISION PERINEAL N/A 12/08/2012   Procedure: CYST EXCISION PERINeum;  Surgeon: Debby A. Cornett, MD;  Location: [facility];  Service: General;  Laterality: N/A;   ESOPHAGOGASTRODUODENOSCOPY (EGD) WITH PROPOFOL  N/A 04/21/2023   Procedure: ESOPHAGOGASTRODUODENOSCOPY (EGD) WITH PROPOFOL ;  Surgeon: Charlanne Groom, MD;  Location: [facility];  Service: Gastroenterology;  Laterality: N/A;   FOOT AMPUTATION Right 2005   I & D EXTREMITY Right  03/07/2017   Procedure: EXCISION FIBULAR HEAD RIGHT BELOW KNEE AMPUTATION;  Surgeon: Harden Jerona GAILS, MD;  Location: [facility] OR;  Service: Orthopedics;  Laterality: Right;   INCISE AND DRAIN ABCESS  12/02/2014   PERINEAL ABSCESS   INCISION AND DRAINAGE PERIRECTAL ABSCESS Left 12/02/2014   Procedure: IRRIGATION AND DEBRIDEMENT PERINEAL ABSCESS;  Surgeon: Vicenta Poli, MD;  Location: [facility];  Service: General;  Laterality: Left;   LEG AMPUTATION BELOW KNEE Right 2005   LOWER EXTREMITY ANGIOGRAPHY N/A 05/05/2017   Procedure: LOWER EXTREMITY  ANGIOGRAPHY;  Surgeon: Court Dorn PARAS, MD;  Location: [facility];  Service: Cardiovascular;  Laterality: N/A;   LOWER EXTREMITY ANGIOGRAPHY N/A 12/23/2023   Procedure: Lower Extremity Angiography;  Surgeon: Serene Gaile ORN, MD;  Location: [facility];  Service: Cardiovascular;  Laterality: N/A;   LOWER EXTREMITY INTERVENTION N/A 12/23/2023   Procedure: LOWER EXTREMITY INTERVENTION;  Surgeon: Serene Gaile ORN, MD;  Location: [facility];  Service: Cardiovascular;  Laterality: N/A;   LUMBAR DISC SURGERY  82,90   ruptured disc   PERIPHERAL VASCULAR INTERVENTION Right 05/05/2017   Procedure: PERIPHERAL VASCULAR INTERVENTION;  Surgeon: Court Dorn PARAS, MD;  Location: [facility];  Service: Cardiovascular;  Laterality: Right;   PERIPHERAL VASCULAR INTERVENTION Right 10/19/2018   Procedure: PERIPHERAL VASCULAR INTERVENTION;  Surgeon: Court Dorn PARAS, MD;  [facility]: [facility];  Service: Cardiovascular;  Laterality: Right;   SCAR REVISION Right 2005   @ amputation   STUMP REVISION Right 08/21/2018   Procedure: REVISION RIGHT BELOW KNEE AMPUTATION, EXCISION FIBULA;  Surgeon: Harden Jerona GAILS, MD;  Location: [facility];  Service: Orthopedics;  Laterality: Right;   STUMP REVISION Right 09/09/2018   Procedure: REVISION RIGHT BELOW KNEE AMPUTATION;  Surgeon: Harden Jerona GAILS, MD;  Location: [facility] OR;  Service: Orthopedics;  Laterality: Right;   TOE AMPUTATION Right 2005   TRANSCAROTID ARTERY REVASCULARIZATION  Left 09/04/2022   Procedure: Left Transcarotid Artery Revascularization;  Surgeon: Serene Gaile ORN, MD;  Location: [facility] OR;  Service: Vascular;  Laterality: Left;   ULTRASOUND GUIDANCE FOR VASCULAR ACCESS Right 09/04/2022   Procedure: ULTRASOUND GUIDANCE FOR VASCULAR ACCESS, RIGHT FEMORAL VEIN;  Surgeon: Serene Gaile ORN, MD;  Location: [facility];  Service: Vascular;  Laterality: Right;    Allergies  Allergen Reactions   Chantix [Varenicline] Nausea Only    ROS denies any  nausea, vomiting, fever, chills, chest pain, shortness of breath   Physical Exam: There were no vitals filed for this visit.  General: The patient is alert and oriented x3 in no acute distress.  Dermatology: Pedal skin thin and atrophic.  Left second toe dorsal PIPJ ulceration scabbed over, stable appearing.  First toe ulceration overall appears stable from previous.  There is hyperkeratotic tissue about the nailbed and associated nail plate but this is stable appearing without surrounding erythema.  No drainage.   Vascular: Left foot DP and PT pulses non palpable.  Cap refill approximately 3 seconds to the digits.  Pedal hair growth absent.  Skin temperature gradient warm to cool  Neurological: Protective sensation decreased.  Musculoskeletal Exam: Prior left foot fourth toe amputation.  Right above-knee amputation.  Mild hammertoes of remaining digits left foot  Results:  US  lower extremity arterial 02/09/24 ----------+--------+-----+---------------+----------+---------------------  ----+  LEFT     PSV cm/sRatioStenosis       Waveform  Comments                    +----------+--------+-----+---------------+----------+---------------------  ----+  CFA Prox  165                         biphasic                              +----------+--------+-----+---------------+----------+---------------------  ----+  CFA Distal163                         biphasic                              +----------+--------+-----+---------------+----------+---------------------  ----+  DFA      143                         biphasic                              +----------+--------+-----+---------------+----------+---------------------  ----+  SFA Prox  227          30-49% stenosisbiphasic  calcified shadowing                                                         plaque noted                 +----------+--------+-----+---------------+----------+---------------------  ----+  POP Distal116                         monophasic                            +----------+--------+-----+---------------+----------+---------------------  ----+  TP Trunk  142                         biphasic                              +----------+--------+-----+---------------+----------+---------------------  ----+   A focal velocity elevation of 227 cm/s was obtained at PRX SFA with a VR  of 1.4. Findings are characteristic of 30-49% stenosis.    Left Stent(s):  +-----------------+--------+--------+----------+--------+  PRX SFA - DST POPPSV cm/sStenosisWaveform  Comments  +-----------------+--------+--------+----------+--------+  Prox to Stent    95              biphasic            +-----------------+--------+--------+----------+--------+  Proximal Stent   101             biphasic            +-----------------+--------+--------+----------+--------+  Mid Stent        60              biphasic            +-----------------+--------+--------+----------+--------+  Distal Stent     105             monophasic          +-----------------+--------+--------+----------+--------+  Distal to Stent  116             monophasic          +-----------------+--------+--------+----------+--------+  Summary:  Left: 30-49% stenosis noted in the superficial femoral artery. Patent  stent with no evidence of stenosis in the superficial femoral and  popliteal artery. Modearte to severe atherosclerosis noted throughout  extremity.     See table(s) above for measurements and observations.      Electronically signed by Gaile New MD on 02/09/2024 at 10:42:52 AM.   ABI testing 02/09/24 ABI Findings:  +--------+------------------+-----+--------+--------+  Right  Rt Pressure (mmHg)IndexWaveformComment    +--------+------------------+-----+--------+--------+  [id number]                                     +--------+------------------+-----+--------+--------+   +---------+------------------+-----+----------+-------+  Left    Lt Pressure (mmHg)IndexWaveform  Comment  +---------+------------------+-----+----------+-------+  Brachial 133                                       +---------+------------------+-----+----------+-------+  PTA     117               0.88 biphasic           +---------+------------------+-----+----------+-------+  DP      86                0.65 monophasic         +---------+------------------+-----+----------+-------+  Great Toe63                0.47 Abnormal           +---------+------------------+-----+----------+-------+   +-------+-----------+-----------+------------+------------+  ABI/TBIToday's ABIToday's TBIPrevious ABIPrevious TBI  +-------+-----------+-----------+------------+------------+  Right BKA                   BKA                       +-------+-----------+-----------+------------+------------+  Left  0.88       0.47       0.89        0.34          +-------+-----------+-----------+------------+------------+         Left ABIs and TBIs appear essentially unchanged.    Summary:  Left: Resting left ankle-brachial index indicates mild left lower  extremity arterial disease. The left toe-brachial index is abnormal.     *See table(s) above for measurements and observations.       Electronically signed by Gaile New MD on 11/24/2023 at 1:46:36 PM.    Assessment/Plan of Care: 1. PAD (peripheral artery disease)   2. 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 History of above knee amputation, right (HCC)      No orders of the defined types were placed in this encounter.  None  # Ischemic ulcer left 1st and 2nd toe # PAD with recent endovascular  procedures -Good improvement to circulation seen with noninvasive vascular testing.  Does have noncompressible vessels however improved Doppler signals and velocities noted. There is still underlying vascular disease as expected. - Did discuss excisional debridement of the first toe ulcer which does seem to be healing.  Patient electing to defer this.  This is reasonable at this point given his history and progression of the wounds - Continue painting the toes with Betadine  daily and protecting from any friction - Monitor for any signs of recurrent infection or further tissue loss -Risk factors include hypertension, hyperlipidemia,  diabetes, history of major amputation, PAD, CAD, current smoker - Follow-up in about 1 month      Britne Borelli L. Lamount MAUL, AACFAS [facility]     [address] [place], [place] [address]                Office [phone]  Fax [phone]

## 2024-03-08 ENCOUNTER — Inpatient Hospital Stay (HOSPITAL_COMMUNITY)
Admission: EM | Admit: 2024-03-08 | Discharge: 2024-03-13 | DRG: 871 | Disposition: A | Discharge status: Home Health | Payer: Medicare (Managed Care) | Attending: Internal Medicine | Admitting: Internal Medicine

## 2024-03-08 ENCOUNTER — Emergency Department (HOSPITAL_COMMUNITY): Payer: Medicare (Managed Care)

## 2024-03-08 ENCOUNTER — Other Ambulatory Visit: Payer: Self-pay

## 2024-03-08 ENCOUNTER — Encounter: Payer: Medicare (Managed Care) | Admitting: Surgery

## 2024-03-08 ENCOUNTER — Encounter (HOSPITAL_COMMUNITY): Payer: Self-pay | Admitting: Emergency Medicine

## 2024-03-08 DIAGNOSIS — Z8673 Personal history of transient ischemic attack (TIA), and cerebral infarction without residual deficits: Secondary | ICD-10-CM

## 2024-03-08 DIAGNOSIS — N10 Acute tubulo-interstitial nephritis: Secondary | ICD-10-CM | POA: Diagnosis present

## 2024-03-08 DIAGNOSIS — G8929 Other chronic pain: Secondary | ICD-10-CM | POA: Diagnosis present

## 2024-03-08 DIAGNOSIS — R296 Repeated falls: Secondary | ICD-10-CM | POA: Diagnosis present

## 2024-03-08 DIAGNOSIS — Z7984 Long term (current) use of oral hypoglycemic drugs: Secondary | ICD-10-CM

## 2024-03-08 DIAGNOSIS — S2239XA Fracture of one rib, unspecified side, initial encounter for closed fracture: Secondary | ICD-10-CM | POA: Insufficient documentation

## 2024-03-08 DIAGNOSIS — D649 Anemia, unspecified: Secondary | ICD-10-CM | POA: Insufficient documentation

## 2024-03-08 DIAGNOSIS — Z7952 Long term (current) use of systemic steroids: Secondary | ICD-10-CM

## 2024-03-08 DIAGNOSIS — M069 Rheumatoid arthritis, unspecified: Secondary | ICD-10-CM | POA: Diagnosis present

## 2024-03-08 DIAGNOSIS — D638 Anemia in other chronic diseases classified elsewhere: Secondary | ICD-10-CM | POA: Diagnosis present

## 2024-03-08 DIAGNOSIS — Z833 Family history of diabetes mellitus: Secondary | ICD-10-CM

## 2024-03-08 DIAGNOSIS — E860 Dehydration: Secondary | ICD-10-CM | POA: Diagnosis present

## 2024-03-08 DIAGNOSIS — G4730 Sleep apnea, unspecified: Secondary | ICD-10-CM | POA: Diagnosis present

## 2024-03-08 DIAGNOSIS — Z79620 Long term (current) use of immunosuppressive biologic: Secondary | ICD-10-CM

## 2024-03-08 DIAGNOSIS — K209 Esophagitis, unspecified without bleeding: Secondary | ICD-10-CM | POA: Diagnosis present

## 2024-03-08 DIAGNOSIS — Z89519 Acquired absence of unspecified leg below knee: Secondary | ICD-10-CM

## 2024-03-08 DIAGNOSIS — A419 Sepsis, unspecified organism: Principal | ICD-10-CM | POA: Diagnosis present

## 2024-03-08 DIAGNOSIS — E876 Hypokalemia: Secondary | ICD-10-CM | POA: Diagnosis present

## 2024-03-08 DIAGNOSIS — Z8349 Family history of other endocrine, nutritional and metabolic diseases: Secondary | ICD-10-CM

## 2024-03-08 DIAGNOSIS — E1140 Type 2 diabetes mellitus with diabetic neuropathy, unspecified: Secondary | ICD-10-CM | POA: Diagnosis present

## 2024-03-08 DIAGNOSIS — E872 Acidosis, unspecified: Secondary | ICD-10-CM | POA: Diagnosis present

## 2024-03-08 DIAGNOSIS — A0472 Enterocolitis due to Clostridium difficile, not specified as recurrent: Secondary | ICD-10-CM | POA: Diagnosis present

## 2024-03-08 DIAGNOSIS — R7989 Other specified abnormal findings of blood chemistry: Secondary | ICD-10-CM | POA: Insufficient documentation

## 2024-03-08 DIAGNOSIS — Z79899 Other long term (current) drug therapy: Secondary | ICD-10-CM

## 2024-03-08 DIAGNOSIS — Z7982 Long term (current) use of aspirin: Secondary | ICD-10-CM

## 2024-03-08 DIAGNOSIS — I639 Cerebral infarction, unspecified: Secondary | ICD-10-CM | POA: Diagnosis present

## 2024-03-08 DIAGNOSIS — K529 Noninfective gastroenteritis and colitis, unspecified: Secondary | ICD-10-CM | POA: Diagnosis present

## 2024-03-08 DIAGNOSIS — I10 Essential (primary) hypertension: Secondary | ICD-10-CM | POA: Diagnosis present

## 2024-03-08 DIAGNOSIS — E440 Moderate protein-calorie malnutrition: Secondary | ICD-10-CM | POA: Diagnosis present

## 2024-03-08 DIAGNOSIS — L03116 Cellulitis of left lower limb: Secondary | ICD-10-CM | POA: Diagnosis present

## 2024-03-08 DIAGNOSIS — E1151 Type 2 diabetes mellitus with diabetic peripheral angiopathy without gangrene: Secondary | ICD-10-CM | POA: Diagnosis present

## 2024-03-08 DIAGNOSIS — Z6821 Body mass index (BMI) 21.0-21.9, adult: Secondary | ICD-10-CM

## 2024-03-08 DIAGNOSIS — Z87440 Personal history of urinary (tract) infections: Secondary | ICD-10-CM

## 2024-03-08 DIAGNOSIS — Z888 Allergy status to other drugs, medicaments and biological substances status: Secondary | ICD-10-CM

## 2024-03-08 DIAGNOSIS — Z1152 Encounter for screening for COVID-19: Secondary | ICD-10-CM

## 2024-03-08 DIAGNOSIS — Z87891 Personal history of nicotine dependence: Secondary | ICD-10-CM

## 2024-03-08 DIAGNOSIS — Z8379 Family history of other diseases of the digestive system: Secondary | ICD-10-CM

## 2024-03-08 DIAGNOSIS — Z89611 Acquired absence of right leg above knee: Secondary | ICD-10-CM

## 2024-03-08 DIAGNOSIS — N12 Tubulo-interstitial nephritis, not specified as acute or chronic: Secondary | ICD-10-CM

## 2024-03-08 DIAGNOSIS — G9341 Metabolic encephalopathy: Secondary | ICD-10-CM | POA: Diagnosis present

## 2024-03-08 DIAGNOSIS — R918 Other nonspecific abnormal finding of lung field: Secondary | ICD-10-CM | POA: Insufficient documentation

## 2024-03-08 DIAGNOSIS — Z7902 Long term (current) use of antithrombotics/antiplatelets: Secondary | ICD-10-CM

## 2024-03-08 DIAGNOSIS — R6520 Severe sepsis without septic shock: Secondary | ICD-10-CM | POA: Diagnosis present

## 2024-03-08 DIAGNOSIS — J449 Chronic obstructive pulmonary disease, unspecified: Secondary | ICD-10-CM | POA: Diagnosis present

## 2024-03-08 DIAGNOSIS — Z794 Long term (current) use of insulin: Secondary | ICD-10-CM

## 2024-03-08 DIAGNOSIS — G9340 Encephalopathy, unspecified: Secondary | ICD-10-CM | POA: Insufficient documentation

## 2024-03-08 DIAGNOSIS — E785 Hyperlipidemia, unspecified: Secondary | ICD-10-CM | POA: Diagnosis present

## 2024-03-08 DIAGNOSIS — K5909 Other constipation: Secondary | ICD-10-CM | POA: Diagnosis present

## 2024-03-08 LAB — CBC WITH DIFFERENTIAL/PLATELET
Abs Immature Granulocytes: 0.08 K/uL — ABNORMAL HIGH (ref 0.00–0.07)
Basophils Absolute: 0.1 K/uL (ref 0.0–0.1)
Basophils Relative: 1 %
Eosinophils Absolute: 0.1 K/uL (ref 0.0–0.5)
Eosinophils Relative: 0 %
HCT: 34.3 % — ABNORMAL LOW (ref 39.0–52.0)
Hemoglobin: 11.1 g/dL — ABNORMAL LOW (ref 13.0–17.0)
Immature Granulocytes: 1 %
Lymphocytes Relative: 8 %
Lymphs Abs: 1.1 K/uL (ref 0.7–4.0)
MCH: 25.4 pg — ABNORMAL LOW (ref 26.0–34.0)
MCHC: 32.4 g/dL (ref 30.0–36.0)
MCV: 78.5 fL — ABNORMAL LOW (ref 80.0–100.0)
Monocytes Absolute: 1.3 K/uL — ABNORMAL HIGH (ref 0.1–1.0)
Monocytes Relative: 9 %
Neutro Abs: 11.6 K/uL — ABNORMAL HIGH (ref 1.7–7.7)
Neutrophils Relative %: 81 %
Platelets: 377 K/uL (ref 150–400)
RBC: 4.37 MIL/uL (ref 4.22–5.81)
RDW: 17 % — ABNORMAL HIGH (ref 11.5–15.5)
WBC: 14.2 K/uL — ABNORMAL HIGH (ref 4.0–10.5)
nRBC: 0 % (ref 0.0–0.2)

## 2024-03-08 LAB — COMPREHENSIVE METABOLIC PANEL WITH GFR
ALT: 32 U/L (ref 0–44)
AST: 65 U/L — ABNORMAL HIGH (ref 15–41)
Albumin: 3 g/dL — ABNORMAL LOW (ref 3.5–5.0)
Alkaline Phosphatase: 201 U/L — ABNORMAL HIGH (ref 38–126)
Anion gap: 11 (ref 5–15)
BUN: 12 mg/dL (ref 8–23)
CO2: 26 mmol/L (ref 22–32)
Calcium: 8.7 mg/dL — ABNORMAL LOW (ref 8.9–10.3)
Chloride: 95 mmol/L — ABNORMAL LOW (ref 98–111)
Creatinine, Ser: 0.92 mg/dL (ref 0.61–1.24)
GFR, Estimated: >60 mL/min
Glucose, Bld: 85 mg/dL (ref 70–99)
Potassium: 4.2 mmol/L (ref 3.5–5.1)
Sodium: 133 mmol/L — ABNORMAL LOW (ref 135–145)
Total Bilirubin: 0.5 mg/dL (ref 0.0–1.2)
Total Protein: 7 g/dL (ref 6.5–8.1)

## 2024-03-08 LAB — I-STAT CHEM 8, ED
BUN: 15 mg/dL (ref 8–23)
Calcium, Ion: 0.85 mmol/L — CL (ref 1.15–1.40)
Chloride: 94 mmol/L — ABNORMAL LOW (ref 98–111)
Creatinine, Ser: 0.9 mg/dL (ref 0.61–1.24)
Glucose, Bld: 81 mg/dL (ref 70–99)
HCT: 34 % — ABNORMAL LOW (ref 39.0–52.0)
Hemoglobin: 11.6 g/dL — ABNORMAL LOW (ref 13.0–17.0)
Potassium: 3.9 mmol/L (ref 3.5–5.1)
Sodium: 133 mmol/L — ABNORMAL LOW (ref 135–145)
TCO2: 28 mmol/L (ref 22–32)

## 2024-03-08 LAB — PRO BRAIN NATRIURETIC PEPTIDE: Pro Brain Natriuretic Peptide: 1280 pg/mL — ABNORMAL HIGH

## 2024-03-08 LAB — RESP PANEL BY RT-PCR (RSV, FLU A&B, COVID)  RVPGX2
Influenza A by PCR: NEGATIVE
Influenza B by PCR: NEGATIVE
Resp Syncytial Virus by PCR: NEGATIVE
SARS Coronavirus 2 by RT PCR: NEGATIVE

## 2024-03-08 LAB — I-STAT CG4 LACTIC ACID, ED
Lactic Acid, Venous: 2.3 mmol/L (ref 0.5–1.9)
Lactic Acid, Venous: 3 mmol/L (ref 0.5–1.9)

## 2024-03-08 LAB — MAGNESIUM: Magnesium: 1.7 mg/dL (ref 1.7–2.4)

## 2024-03-08 MED ORDER — LACTATED RINGERS IV BOLUS (SEPSIS)
500.0000 mL | Freq: Once | INTRAVENOUS | Status: AC
  Administered 2024-03-08: 500 mL via INTRAVENOUS

## 2024-03-08 MED ORDER — SODIUM CHLORIDE 0.9 % IV SOLN
2.0000 g | Freq: Once | INTRAVENOUS | Status: AC
  Administered 2024-03-08: 2 g via INTRAVENOUS
  Filled 2024-03-08: qty 20

## 2024-03-08 MED ORDER — IOHEXOL 300 MG/ML  SOLN
100.0000 mL | Freq: Once | INTRAMUSCULAR | Status: AC | PRN
  Administered 2024-03-08: 100 mL via INTRAVENOUS

## 2024-03-08 MED ORDER — ACETAMINOPHEN 325 MG PO TABS
650.0000 mg | ORAL_TABLET | Freq: Once | ORAL | Status: AC
  Administered 2024-03-09: 650 mg via ORAL
  Filled 2024-03-08: qty 2

## 2024-03-08 MED ORDER — LACTATED RINGERS IV BOLUS (SEPSIS): 1000.0000 mL | Freq: Once | INTRAVENOUS | Status: DC

## 2024-03-08 MED ORDER — SODIUM CHLORIDE 0.9 % IV BOLUS (SEPSIS)
1000.0000 mL | Freq: Once | INTRAVENOUS | Status: AC
  Administered 2024-03-08: 1000 mL via INTRAVENOUS

## 2024-03-08 MED ORDER — VANCOMYCIN HCL IN DEXTROSE 1-5 GM/200ML-% IV SOLN
1000.0000 mg | Freq: Once | INTRAVENOUS | Status: AC
  Administered 2024-03-08: 1000 mg via INTRAVENOUS
  Filled 2024-03-08: qty 200

## 2024-03-08 NOTE — ED Provider Notes (Signed)
 " [facility] AT [facility] Provider Note   CSN: [id number] Arrival date & time: 03/08/24  2121     Patient presents with: Weakness   Brian Zavala is a 74 y.o. male.    Weakness Associated symptoms: abdominal pain, diarrhea and fever   Patient presents for generalized weakness and diarrhea.  Medical history includes HTN, HLD, COPD, CVA, DM, arthritis, osteomyelitis.  For the past week, patient has had watery diarrhea and worsening generalized weakness.  He lives alone but does have a sister who checks in on him and helps him out.  He has a right AKA and is able to get around with a walking wheelchair.  EMS noted low-grade fever.  Patient endorses some mild generalized abdominal discomfort.  He denies any other areas of pain.     Prior to Admission medications  Medication Sig Start Date End Date Taking? Authorizing Provider  aspirin  81 MG chewable tablet Chew 81 mg by mouth in the morning.    [provider]  atorvastatin  (LIPITOR ) 80 MG tablet Take 1 tablet (80 mg total) by mouth daily at 6 PM. 05/06/17   Henry Manuelita NOVAK, NP  calcium  carbonate (OSCAL) 1500 (600 Ca) MG TABS tablet Take 600 mg of elemental calcium  by mouth daily with breakfast.     [provider]  clopidogrel  (PLAVIX ) 75 MG tablet Take 1 tablet (75 mg total) by mouth daily. 09/02/22   Singh, Prashant K, MD  Cyanocobalamin  (VITAMIN B-12 PO) Take 1 tablet by mouth daily.    [provider]  etanercept  (ENBREL ) 50 MG/ML injection Inject 50 mg into the skin every Sunday.    [provider]  hydrochlorothiazide (MICROZIDE) 12.5 MG capsule Take 12.5 mg by mouth daily.    [provider]  insulin aspart (NOVOLOG) 100 UNIT/ML injection Inject 2-12 Units into the skin 4 (four) times daily -  before meals and at bedtime. Inject 2-12 units subcutaneously based on sliding scale:  131-180   2 units 181-240   4 units 241-300   6 units 301-350   8  units 351-400   10 units >400        12  units    [provider]  insulin  glargine (SEMGLEE ) 100 UNIT/ML injection Inject 0.7 mLs (70 Units total) into the skin daily. 10/23/23   Motwani, Komal, MD  leflunomide  (ARAVA ) 20 MG tablet Take 20 mg by mouth daily.    [provider]  linaclotide  (LINZESS ) 290 MCG CAPS capsule Take 290 mcg by mouth daily before breakfast.    [provider]  metFORMIN  (GLUCOPHAGE -XR) 500 MG 24 hr tablet Take 1,000 mg by mouth 2 (two) times daily with a meal.    [provider]  metoCLOPramide  (REGLAN ) 5 MG tablet Take 5 mg by mouth 4 (four) times daily as needed for nausea or vomiting.    [provider]  metoprolol  succinate (TOPROL -XL) 25 MG 24 hr tablet Take 25 mg by mouth at bedtime.    [provider]  morphine  (MS CONTIN ) 100 MG 12 hr tablet Take 100 mg by mouth every 12 (twelve) hours.    [provider]  ondansetron  (ZOFRAN ) 4 MG tablet Take 1 tablet (4 mg total) by mouth every 6 (six) hours. 08/18/23   Cleotilde Rogue, MD  oxymetazoline  (AFRIN) 0.05 % nasal spray Place 1 spray into both nostrils 2 (two) times daily as needed for congestion.    [provider]  pantoprazole  (PROTONIX ) 40 MG  tablet Take 1 tablet (40 mg total) by mouth daily before breakfast. Patient taking differently: Take 40 mg by mouth daily as needed (indigestion/heartburn.). 10/28/22   McMichael, Nestor HERO, PA-C  predniSONE  (DELTASONE ) 1 MG tablet Take 2 mg by mouth daily with breakfast.    [provider]  pregabalin  (LYRICA ) 150 MG capsule Take 150 mg by mouth 2 (two) times daily.    [provider]  promethazine  (PHENERGAN ) 25 MG tablet Take 25 mg by mouth every 8 (eight) hours as needed for nausea or vomiting.    [provider]    Allergies: Chantix [varenicline]    Review of Systems  Constitutional:  Positive for fatigue and fever.  Gastrointestinal:  Positive for abdominal pain and  diarrhea.  Neurological:  Positive for weakness (generalized).  All other systems reviewed and are negative.   Updated Vital Signs BP 118/79 (BP Location: Left Arm)   Pulse 94   Temp (!) 101.3 F (38.5 C) (Rectal)   Resp 16   SpO2 98%   Physical Exam Vitals and nursing note reviewed.  Constitutional:      General: He is not in acute distress.    Appearance: Normal appearance. He is well-developed. He is not toxic-appearing or diaphoretic.  HENT:     Head: Normocephalic and atraumatic.     Right Ear: External ear normal.     Left Ear: External ear normal.     Nose: Nose normal.     Mouth/Throat:     Mouth: Mucous membranes are moist.  Eyes:     Extraocular Movements: Extraocular movements intact.     Conjunctiva/sclera: Conjunctivae normal.  Cardiovascular:     Rate and Rhythm: Regular rhythm.  Pulmonary:     Effort: Pulmonary effort is normal. No respiratory distress.     Breath sounds: Normal breath sounds. No wheezing or rales.  Abdominal:     Palpations: Abdomen is soft.     Tenderness: There is abdominal tenderness. There is no guarding or rebound.  Musculoskeletal:        General: No swelling. Normal range of motion.     Cervical back: Normal range of motion and neck supple.  Skin:    General: Skin is warm and dry.     Findings: Erythema present.  Neurological:     General: No focal deficit present.     Mental Status: He is alert and oriented to person, place, and time.  Psychiatric:        Mood and Affect: Mood normal.        Behavior: Behavior normal.     (all labs ordered are listed, but only abnormal results are displayed) Labs Reviewed  COMPREHENSIVE METABOLIC PANEL WITH GFR - Abnormal; Notable for the following components:      Result Value   Sodium 133 (*)    Chloride 95 (*)    Calcium  8.7 (*)    Albumin 3.0 (*)    AST 65 (*)    Alkaline Phosphatase 201 (*)    All other components within normal limits  CBC WITH DIFFERENTIAL/PLATELET -  Abnormal; Notable for the following components:   WBC 14.2 (*)    Hemoglobin 11.1 (*)    HCT 34.3 (*)    MCV 78.5 (*)    MCH 25.4 (*)    RDW 17.0 (*)    Neutro Abs 11.6 (*)    Monocytes Absolute 1.3 (*)    Abs Immature Granulocytes 0.08 (*)    All other components within  normal limits  PRO BRAIN NATRIURETIC PEPTIDE - Abnormal; Notable for the following components:   Pro Brain Natriuretic Peptide 1,280.0 (*)    All other components within normal limits  I-STAT CG4 LACTIC ACID, ED - Abnormal; Notable for the following components:   Lactic Acid, Venous 3.0 (*)    All other components within normal limits  I-STAT CHEM 8, ED - Abnormal; Notable for the following components:   Sodium 133 (*)    Chloride 94 (*)    Calcium , Ion 0.85 (*)    Hemoglobin 11.6 (*)    HCT 34.0 (*)    All other components within normal limits  I-STAT CG4 LACTIC ACID, ED - Abnormal; Notable for the following components:   Lactic Acid, Venous 2.3 (*)    All other components within normal limits  RESP PANEL BY RT-PCR (RSV, FLU A&B, COVID)  RVPGX2  CULTURE, BLOOD (ROUTINE X 2)  CULTURE, BLOOD (ROUTINE X 2)  C DIFFICILE QUICK SCREEN W PCR REFLEX    GASTROINTESTINAL PANEL BY PCR, STOOL (REPLACES STOOL CULTURE)  MAGNESIUM   URINALYSIS, W/ REFLEX TO CULTURE (INFECTION SUSPECTED)    EKG: None  Radiology: DG Tibia/Fibula Left Result Date: 03/08/2024 CLINICAL DATA:  Cellulitis weakness EXAM: LEFT TIBIA AND FIBULA - 2 VIEW COMPARISON:  None Available. FINDINGS: No fracture or malalignment. No periostitis or osseous destructive change. Vascular stent posterior to the knee. Degenerative changes at the ankle IMPRESSION: No acute osseous abnormality. Electronically Signed   By: Luke Bun M.D.   On: 03/08/2024 23:44   DG Chest Port 1 View Result Date: 03/08/2024 EXAM: 1 VIEW(S) XRAY OF THE CHEST 03/08/2024 09:48:00 PM COMPARISON: Chest x-ray dated 10/30/2018. CLINICAL HISTORY: Questionable sepsis - evaluate for  abnormality FINDINGS: LUNGS AND PLEURA: Low lung volumes. No pleural effusion. No pneumothorax. Interstitial opacities in the lower lungs worrisome for edema. HEART AND MEDIASTINUM: The heart is mildly enlarged. BONES AND SOFT TISSUES: No acute osseous abnormality. IMPRESSION: 1. Lower lung interstitial opacities, concerning for pulmonary edema. 2. Mild cardiomegaly. Electronically signed by: Greig Pique MD 03/08/2024 09:53 PM EST RP Workstation: [id number]     Procedures   Medications Ordered in the ED  sodium chloride  0.9 % bolus 1,000 mL (1,000 mLs Intravenous New Bag/Given 03/08/24 2330)  cefTRIAXone  (ROCEPHIN ) 2 g in sodium chloride  0.9 % 100 mL IVPB (2 g Intravenous New Bag/Given 03/08/24 2330)  vancomycin  (VANCOCIN ) IVPB 1000 mg/200 mL premix (1,000 mg Intravenous New Bag/Given 03/08/24 2330)  acetaminophen  (TYLENOL ) tablet 650 mg (has no administration in time range)  lactated ringers  bolus 500 mL (0 mLs Intravenous Stopped 03/08/24 2253)                                    Medical Decision Making Amount and/or Complexity of Data Reviewed Labs: ordered. Radiology: ordered.  Risk Prescription drug management.   This patient presents to the ED for concern of generalized weakness, this involves an extensive number of treatment options, and is a complaint that carries with it a high risk of complications and morbidity.  The differential diagnosis includes sepsis, dehydration, deconditioning, anemia, metabolic derangements, polypharmacy   Co morbidities / Chronic conditions that complicate the patient evaluation  HTN, HLD, COPD, CVA, DM, arthritis, osteomyelitis   Additional history obtained:  Additional history obtained from EMR External records from outside source obtained and reviewed including patient's sister   Lab Tests:  I Ordered, and personally interpreted labs.  The pertinent results include: Leukocytosis and lactic acidosis are present consistent with sepsis; kidney  function is normal.   Imaging Studies ordered:  I ordered imaging studies including x-ray of chest and left tip/fib; CT of chest, abdomen, pelvis I independently visualized and interpreted imaging which showed (results pending at time of signout) I agree with the radiologist interpretation   Cardiac Monitoring: / EKG:  The patient was maintained on a cardiac monitor.  I personally viewed and interpreted the cardiac monitored which showed an underlying rhythm of: Sinus rhythm   Problem List / ED Course / Critical interventions / Medication management  Patient presents for 1 week of worsening generalized weakness in the setting of diffuse watery diarrhea.  EMS noted low-grade fever.  He was given IV fluids prior to arrival.  With his weakness, he has had multiple falls today.  He denies any areas of discomfort other than some mild generalized abdominal pain.  There is some slight tenderness on exam.  Exam also notable for area of swelling, erythema and warmth in distal left lower extremity.  These findings consistent with cellulitis.  Vancomycin  and ceftriaxone  ordered.  Workup was initiated.  Patient was found to be febrile.  Tylenol  ordered for antipyresis.  Patient sister arrived at bedside and was able to provide further history.  Sister reports that his diarrhea has improved over the last several days, however, his weakness worsened.  Weakness led to falls today.  These occurred during transferring.  At baseline, patient is able to transfer to his motorized wheelchair without difficulty.  Lab work notable for leukocytosis and lactic acidosis.  Remaining lab work and imaging studies pending at time of signout.  Care of patient signed out to oncoming ED provider. I ordered medication including IV fluids for hydration; ceftriaxone  and vancomycin  for cellulitis; Tylenol  for antipyresis Reevaluation of the patient after these medicines showed that the patient improved I have reviewed the patients  home medicines and have made adjustments as needed  Social Determinants of Health:  Lives independently  CRITICAL CARE Performed by: Bernardino Fireman   Total critical care time: 32 minutes  Critical care time was exclusive of separately billable procedures and treating other patients.  Critical care was necessary to treat or prevent imminent or life-threatening deterioration.  Critical care was time spent personally by me on the following activities: development of treatment plan with patient and/or surrogate as well as nursing, discussions with consultants, evaluation of patient's response to treatment, examination of patient, obtaining history from patient or surrogate, ordering and performing treatments and interventions, ordering and review of laboratory studies, ordering and review of radiographic studies, pulse oximetry and re-evaluation of patient's condition.     Final diagnoses:  Sepsis, due to unspecified organism, unspecified whether acute organ dysfunction present [facility])  Cellulitis of left lower extremity    ED Discharge Orders     None          Fireman Bernardino, MD 03/08/24 2349  "

## 2024-03-08 NOTE — ED Triage Notes (Signed)
 Patient BIB EMS from home c/o weakness x 1 week. Patient report he fell twice today, denies hitting his head and LOC. Patient had 900 LR Bolus given by EMS. Patient denies N/V. Report watery loose stool x 2 today. Patient denies chest pain and SOB.

## 2024-03-08 NOTE — ED Notes (Signed)
 Section parademic made aware of PT's temperature.

## 2024-03-09 ENCOUNTER — Inpatient Hospital Stay (HOSPITAL_COMMUNITY): Payer: Medicare (Managed Care)

## 2024-03-09 ENCOUNTER — Encounter (HOSPITAL_COMMUNITY): Payer: Self-pay | Admitting: Internal Medicine

## 2024-03-09 DIAGNOSIS — K5909 Other constipation: Secondary | ICD-10-CM | POA: Diagnosis present

## 2024-03-09 DIAGNOSIS — D649 Anemia, unspecified: Secondary | ICD-10-CM | POA: Insufficient documentation

## 2024-03-09 DIAGNOSIS — E1140 Type 2 diabetes mellitus with diabetic neuropathy, unspecified: Secondary | ICD-10-CM

## 2024-03-09 DIAGNOSIS — S2239XA Fracture of one rib, unspecified side, initial encounter for closed fracture: Secondary | ICD-10-CM | POA: Insufficient documentation

## 2024-03-09 DIAGNOSIS — Z89611 Acquired absence of right leg above knee: Secondary | ICD-10-CM | POA: Diagnosis not present

## 2024-03-09 DIAGNOSIS — N12 Tubulo-interstitial nephritis, not specified as acute or chronic: Secondary | ICD-10-CM | POA: Diagnosis present

## 2024-03-09 DIAGNOSIS — Z7982 Long term (current) use of aspirin: Secondary | ICD-10-CM | POA: Diagnosis not present

## 2024-03-09 DIAGNOSIS — J449 Chronic obstructive pulmonary disease, unspecified: Secondary | ICD-10-CM | POA: Diagnosis present

## 2024-03-09 DIAGNOSIS — R918 Other nonspecific abnormal finding of lung field: Secondary | ICD-10-CM | POA: Insufficient documentation

## 2024-03-09 DIAGNOSIS — Z794 Long term (current) use of insulin: Secondary | ICD-10-CM | POA: Diagnosis not present

## 2024-03-09 DIAGNOSIS — D638 Anemia in other chronic diseases classified elsewhere: Secondary | ICD-10-CM | POA: Diagnosis present

## 2024-03-09 DIAGNOSIS — E440 Moderate protein-calorie malnutrition: Secondary | ICD-10-CM | POA: Diagnosis present

## 2024-03-09 DIAGNOSIS — E876 Hypokalemia: Secondary | ICD-10-CM | POA: Diagnosis present

## 2024-03-09 DIAGNOSIS — I10 Essential (primary) hypertension: Secondary | ICD-10-CM | POA: Diagnosis present

## 2024-03-09 DIAGNOSIS — Z7902 Long term (current) use of antithrombotics/antiplatelets: Secondary | ICD-10-CM | POA: Diagnosis not present

## 2024-03-09 DIAGNOSIS — E785 Hyperlipidemia, unspecified: Secondary | ICD-10-CM | POA: Diagnosis present

## 2024-03-09 DIAGNOSIS — E1151 Type 2 diabetes mellitus with diabetic peripheral angiopathy without gangrene: Secondary | ICD-10-CM | POA: Diagnosis present

## 2024-03-09 DIAGNOSIS — N10 Acute tubulo-interstitial nephritis: Secondary | ICD-10-CM | POA: Diagnosis present

## 2024-03-09 DIAGNOSIS — G9340 Encephalopathy, unspecified: Secondary | ICD-10-CM

## 2024-03-09 DIAGNOSIS — R6520 Severe sepsis without septic shock: Secondary | ICD-10-CM | POA: Diagnosis present

## 2024-03-09 DIAGNOSIS — K209 Esophagitis, unspecified without bleeding: Secondary | ICD-10-CM | POA: Insufficient documentation

## 2024-03-09 DIAGNOSIS — Z1152 Encounter for screening for COVID-19: Secondary | ICD-10-CM | POA: Diagnosis not present

## 2024-03-09 DIAGNOSIS — G9341 Metabolic encephalopathy: Secondary | ICD-10-CM | POA: Diagnosis present

## 2024-03-09 DIAGNOSIS — R7989 Other specified abnormal findings of blood chemistry: Secondary | ICD-10-CM | POA: Insufficient documentation

## 2024-03-09 DIAGNOSIS — E860 Dehydration: Secondary | ICD-10-CM | POA: Diagnosis present

## 2024-03-09 DIAGNOSIS — A0472 Enterocolitis due to Clostridium difficile, not specified as recurrent: Secondary | ICD-10-CM | POA: Diagnosis present

## 2024-03-09 DIAGNOSIS — M069 Rheumatoid arthritis, unspecified: Secondary | ICD-10-CM | POA: Diagnosis present

## 2024-03-09 DIAGNOSIS — E872 Acidosis, unspecified: Secondary | ICD-10-CM | POA: Diagnosis present

## 2024-03-09 DIAGNOSIS — A419 Sepsis, unspecified organism: Secondary | ICD-10-CM | POA: Diagnosis present

## 2024-03-09 DIAGNOSIS — L03116 Cellulitis of left lower limb: Secondary | ICD-10-CM | POA: Diagnosis present

## 2024-03-09 DIAGNOSIS — Z7952 Long term (current) use of systemic steroids: Secondary | ICD-10-CM | POA: Diagnosis not present

## 2024-03-09 LAB — CBC WITH DIFFERENTIAL/PLATELET
Abs Immature Granulocytes: 0.11 K/uL — ABNORMAL HIGH (ref 0.00–0.07)
Basophils Absolute: 0.1 K/uL (ref 0.0–0.1)
Basophils Relative: 0 %
Eosinophils Absolute: 0.1 K/uL (ref 0.0–0.5)
Eosinophils Relative: 0 %
HCT: 31 % — ABNORMAL LOW (ref 39.0–52.0)
Hemoglobin: 9.9 g/dL — ABNORMAL LOW (ref 13.0–17.0)
Immature Granulocytes: 1 %
Lymphocytes Relative: 5 %
Lymphs Abs: 0.7 K/uL (ref 0.7–4.0)
MCH: 25.4 pg — ABNORMAL LOW (ref 26.0–34.0)
MCHC: 31.9 g/dL (ref 30.0–36.0)
MCV: 79.7 fL — ABNORMAL LOW (ref 80.0–100.0)
Monocytes Absolute: 1.3 K/uL — ABNORMAL HIGH (ref 0.1–1.0)
Monocytes Relative: 9 %
Neutro Abs: 12.8 K/uL — ABNORMAL HIGH (ref 1.7–7.7)
Neutrophils Relative %: 85 %
Platelets: 303 K/uL (ref 150–400)
RBC: 3.89 MIL/uL — ABNORMAL LOW (ref 4.22–5.81)
RDW: 16.9 % — ABNORMAL HIGH (ref 11.5–15.5)
WBC: 15 K/uL — ABNORMAL HIGH (ref 4.0–10.5)
nRBC: 0 % (ref 0.0–0.2)

## 2024-03-09 LAB — URINALYSIS, W/ REFLEX TO CULTURE (INFECTION SUSPECTED)
Bilirubin Urine: NEGATIVE
Glucose, UA: NEGATIVE mg/dL
Ketones, ur: NEGATIVE mg/dL
Nitrite: NEGATIVE
Protein, ur: NEGATIVE mg/dL
Specific Gravity, Urine: 1.005 (ref 1.005–1.030)
WBC, UA: >50 WBC/hpf (ref 0–5)
pH: 6 (ref 5.0–8.0)

## 2024-03-09 LAB — BASIC METABOLIC PANEL WITH GFR
Anion gap: 8 (ref 5–15)
BUN: 11 mg/dL (ref 8–23)
CO2: 27 mmol/L (ref 22–32)
Calcium: 8.1 mg/dL — ABNORMAL LOW (ref 8.9–10.3)
Chloride: 101 mmol/L (ref 98–111)
Creatinine, Ser: 0.72 mg/dL (ref 0.61–1.24)
GFR, Estimated: >60 mL/min
Glucose, Bld: 62 mg/dL — ABNORMAL LOW (ref 70–99)
Potassium: 3.1 mmol/L — ABNORMAL LOW (ref 3.5–5.1)
Sodium: 136 mmol/L (ref 135–145)

## 2024-03-09 LAB — CBG MONITORING, ED
Glucose-Capillary: 69 mg/dL — ABNORMAL LOW (ref 70–99)
Glucose-Capillary: 103 mg/dL — ABNORMAL HIGH (ref 70–99)
Glucose-Capillary: 173 mg/dL — ABNORMAL HIGH (ref 70–99)

## 2024-03-09 LAB — URINE DRUG SCREEN
Amphetamines: NEGATIVE
Barbiturates: NEGATIVE
Benzodiazepines: NEGATIVE
Cocaine: NEGATIVE
Fentanyl: NEGATIVE
Methadone Scn, Ur: NEGATIVE
Opiates: POSITIVE — AB
Tetrahydrocannabinol: POSITIVE — AB

## 2024-03-09 LAB — GLUCOSE, CAPILLARY
Glucose-Capillary: 232 mg/dL — ABNORMAL HIGH (ref 70–99)
Glucose-Capillary: 201 mg/dL — ABNORMAL HIGH (ref 70–99)

## 2024-03-09 LAB — HEPATIC FUNCTION PANEL
ALT: 26 U/L (ref 0–44)
AST: 42 U/L — ABNORMAL HIGH (ref 15–41)
Albumin: 2.7 g/dL — ABNORMAL LOW (ref 3.5–5.0)
Alkaline Phosphatase: 148 U/L — ABNORMAL HIGH (ref 38–126)
Bilirubin, Direct: 0.3 mg/dL — ABNORMAL HIGH (ref 0.0–0.2)
Indirect Bilirubin: 0.1 mg/dL — ABNORMAL LOW (ref 0.3–0.9)
Total Bilirubin: 0.4 mg/dL (ref 0.0–1.2)
Total Protein: 5.9 g/dL — ABNORMAL LOW (ref 6.5–8.1)

## 2024-03-09 LAB — LACTIC ACID, PLASMA: Lactic Acid, Venous: 1.4 mmol/L (ref 0.5–1.9)

## 2024-03-09 MED ORDER — LINACLOTIDE 145 MCG PO CAPS
290.0000 ug | ORAL_CAPSULE | Freq: Every day | ORAL | Status: DC
  Administered 2024-03-10: 290 ug via ORAL
  Filled 2024-03-09: qty 2

## 2024-03-09 MED ORDER — ENOXAPARIN SODIUM 40 MG/0.4ML IJ SOSY
40.0000 mg | PREFILLED_SYRINGE | INTRAMUSCULAR | Status: DC
  Administered 2024-03-09: 40 mg via SUBCUTANEOUS
  Filled 2024-03-09: qty 0.4

## 2024-03-09 MED ORDER — PREGABALIN 75 MG PO CAPS
150.0000 mg | ORAL_CAPSULE | Freq: Two times a day (BID) | ORAL | Status: DC
  Administered 2024-03-09 – 2024-03-12 (×7): 150 mg via ORAL
  Filled 2024-03-09 (×4): qty 2
  Filled 2024-03-09: qty 3
  Filled 2024-03-09 (×2): qty 2

## 2024-03-09 MED ORDER — MORPHINE SULFATE ER 80 MG PO CP24
80.0000 mg | ORAL_CAPSULE | Freq: Two times a day (BID) | ORAL | Status: DC
  Filled 2024-03-09: qty 1

## 2024-03-09 MED ORDER — VITAMIN B-12 1000 MCG PO TABS
1000.0000 ug | ORAL_TABLET | Freq: Every day | ORAL | Status: DC
  Administered 2024-03-09 – 2024-03-13 (×5): 1000 ug via ORAL
  Filled 2024-03-09 (×5): qty 1

## 2024-03-09 MED ORDER — INSULIN ASPART 100 UNIT/ML IJ SOLN
0.0000 [IU] | Freq: Three times a day (TID) | INTRAMUSCULAR | Status: DC
  Administered 2024-03-09: 3 [IU] via SUBCUTANEOUS
  Administered 2024-03-09: 2 [IU] via SUBCUTANEOUS
  Administered 2024-03-10 (×2): 3 [IU] via SUBCUTANEOUS
  Administered 2024-03-10 – 2024-03-11 (×3): 2 [IU] via SUBCUTANEOUS
  Administered 2024-03-11: 5 [IU] via SUBCUTANEOUS
  Administered 2024-03-12: 3 [IU] via SUBCUTANEOUS
  Filled 2024-03-09: qty 5
  Filled 2024-03-09: qty 4
  Filled 2024-03-09: qty 2
  Filled 2024-03-09: qty 3
  Filled 2024-03-09: qty 2
  Filled 2024-03-09 (×3): qty 3

## 2024-03-09 MED ORDER — MORPHINE SULFATE ER 30 MG PO TBCR
60.0000 mg | EXTENDED_RELEASE_TABLET | Freq: Two times a day (BID) | ORAL | Status: DC
  Administered 2024-03-09 – 2024-03-13 (×8): 60 mg via ORAL
  Filled 2024-03-09 (×8): qty 2

## 2024-03-09 MED ORDER — INSULIN GLARGINE 100 UNIT/ML ~~LOC~~ SOLN
35.0000 [IU] | Freq: Every day | SUBCUTANEOUS | Status: DC
  Administered 2024-03-09: 35 [IU] via SUBCUTANEOUS
  Filled 2024-03-09: qty 0.35

## 2024-03-09 MED ORDER — PANTOPRAZOLE SODIUM 40 MG PO TBEC
40.0000 mg | DELAYED_RELEASE_TABLET | Freq: Every day | ORAL | Status: DC
  Administered 2024-03-10 – 2024-03-13 (×4): 40 mg via ORAL
  Filled 2024-03-09 (×5): qty 1

## 2024-03-09 MED ORDER — POTASSIUM CHLORIDE CRYS ER 20 MEQ PO TBCR
40.0000 meq | EXTENDED_RELEASE_TABLET | Freq: Once | ORAL | Status: AC
  Administered 2024-03-09: 40 meq via ORAL
  Filled 2024-03-09: qty 2

## 2024-03-09 MED ORDER — SODIUM CHLORIDE 0.9 % IV SOLN
2.0000 g | INTRAVENOUS | Status: DC
  Administered 2024-03-10 – 2024-03-12 (×4): 2 g via INTRAVENOUS
  Filled 2024-03-09 (×4): qty 20

## 2024-03-09 MED ORDER — ASPIRIN 81 MG PO CHEW
81.0000 mg | CHEWABLE_TABLET | Freq: Every morning | ORAL | Status: DC
  Administered 2024-03-09 – 2024-03-13 (×5): 81 mg via ORAL
  Filled 2024-03-09 (×5): qty 1

## 2024-03-09 MED ORDER — PANTOPRAZOLE SODIUM 40 MG IV SOLR
40.0000 mg | INTRAVENOUS | Status: DC
  Administered 2024-03-09: 40 mg via INTRAVENOUS
  Filled 2024-03-09: qty 10

## 2024-03-09 MED ORDER — VANCOMYCIN HCL IN DEXTROSE 1-5 GM/200ML-% IV SOLN
1000.0000 mg | Freq: Two times a day (BID) | INTRAVENOUS | Status: DC
  Administered 2024-03-09 – 2024-03-10 (×4): 1000 mg via INTRAVENOUS
  Filled 2024-03-09 (×5): qty 200

## 2024-03-09 MED ORDER — INSULIN GLARGINE 100 UNIT/ML ~~LOC~~ SOLN
25.0000 [IU] | Freq: Every day | SUBCUTANEOUS | Status: DC
  Administered 2024-03-10: 25 [IU] via SUBCUTANEOUS
  Filled 2024-03-09 (×2): qty 0.25

## 2024-03-09 MED ORDER — ACETAMINOPHEN 650 MG RE SUPP: 650.0000 mg | Freq: Four times a day (QID) | RECTAL | Status: DC | PRN

## 2024-03-09 MED ORDER — ACETAMINOPHEN 325 MG PO TABS
650.0000 mg | ORAL_TABLET | Freq: Four times a day (QID) | ORAL | Status: DC | PRN
  Filled 2024-03-09: qty 2

## 2024-03-09 MED ORDER — HYDROCORTISONE SOD SUC (PF) 100 MG IJ SOLR
50.0000 mg | Freq: Once | INTRAMUSCULAR | Status: AC
  Administered 2024-03-09: 50 mg via INTRAVENOUS
  Filled 2024-03-09: qty 2

## 2024-03-09 MED ORDER — HYDROCORTISONE SOD SUC (PF) 100 MG IJ SOLR: 50.0000 mg | Freq: Two times a day (BID) | INTRAMUSCULAR | Status: DC

## 2024-03-09 MED ORDER — LACTATED RINGERS IV SOLN: INTRAVENOUS | Status: AC

## 2024-03-09 MED ORDER — CLOPIDOGREL BISULFATE 75 MG PO TABS
75.0000 mg | ORAL_TABLET | Freq: Every day | ORAL | Status: DC
  Administered 2024-03-09 – 2024-03-13 (×5): 75 mg via ORAL
  Filled 2024-03-09 (×5): qty 1

## 2024-03-09 MED ORDER — PREDNISONE 1 MG PO TABS
2.0000 mg | ORAL_TABLET | Freq: Every day | ORAL | Status: DC
  Administered 2024-03-10 – 2024-03-13 (×4): 2 mg via ORAL
  Filled 2024-03-09 (×4): qty 2

## 2024-03-09 MED ORDER — ATORVASTATIN CALCIUM 20 MG PO TABS
80.0000 mg | ORAL_TABLET | Freq: Every day | ORAL | Status: DC
  Administered 2024-03-09 – 2024-03-12 (×4): 80 mg via ORAL
  Filled 2024-03-09 (×4): qty 4

## 2024-03-09 MED ORDER — POTASSIUM CHLORIDE 20 MEQ PO PACK
20.0000 meq | PACK | Freq: Once | ORAL | Status: AC
  Administered 2024-03-09: 20 meq via ORAL
  Filled 2024-03-09: qty 1

## 2024-03-09 NOTE — Plan of Care (Signed)
   Problem: Education: Goal: Ability to describe self-care measures that may prevent or decrease complications (Diabetes Survival Skills Education) will improve Outcome: Progressing

## 2024-03-09 NOTE — ED Notes (Signed)
 Pt blood sugar noted to be 69. Pt alert and oriented without complaints of dizziness or lightheadedness. Pt given orange juice to drink. Wife at bedside assisting Pt drink

## 2024-03-09 NOTE — Progress Notes (Signed)
 Pharmacy Antibiotic Note  Brian Zavala is a 74 y.o. male admitted on 03/08/2024 with sepsis due to lower extremity cellulitis.  Pharmacy has been consulted for Vancomycin  dosing.  Received Vancomycin  1g and Ceftriaxone  2g IV x 1 dose each in the ED.  Last weight in EPIC = 74.8 kg (12/23/2023)  Plan: Vancomycin  1000 mg IV Q 12 hrs. Goal AUC 400-550.  Expected AUC: 541.4  SCr used: 0.9 Ceftriaxone  per MD Follow renal function F/u culture results & sensitivities F/U weight once documented this admission and confirm vancomycin  dosing regimen     Temp (24hrs), Avg:98.9 F (37.2 C), Min:97.5 F (36.4 C), Max:101.3 F (38.5 C)  Recent Labs  Lab 03/08/24 2136 03/08/24 2152 03/08/24 2343  WBC 14.2*  --   --   CREATININE 0.92 0.90  --   LATICACIDVEN  --  3.0* 2.3*    CrCl cannot be calculated (Unknown ideal weight.).    Allergies[1]  Antimicrobials this admission: 1/5 Ceftriaxone  >>   1/5 Vancomycin   >>    Dose adjustments this admission:    Microbiology results: 1/5 BCx:   1/5 UCx:       Thank you for allowing pharmacy to be a part of this patients care.  Kareema Keitt, PharmD 03/09/2024 4:21 AM     [1]  Allergies Allergen Reactions   Chantix [Varenicline] Nausea Only

## 2024-03-09 NOTE — Progress Notes (Signed)
 " Progress Note   Patient: Brian Zavala FMW:[id number] DOB: December 31, 1950 DOA: 03/08/2024     0 DOS: the patient was seen and examined on 03/09/2024   Brief hospital course: 74 year old male PMH diabetes mellitus type 2, PAD, status post right AKA, presented with weakness, diarrhea.  Admitted for sepsis secondary to bilateral pyelonephritis, proctocolitis, there was also concern for left lower extremity cellulitis although sister reported this was chronic.  Consultants None   Procedures/Events 1/5 admit for sepsis  Assessment and Plan: Sepsis Bilateral pyelonephritis Proctocolitis Left lower extremity cellulitis ruled out Acute encephalopathy WBC 14.2 on admission.  Lactic acid 3.0 on admission.  Influenza, RSV, COVID-negative.  Blood cultures pending, urine culture pending.  CT head no acute abnormalities.  CT chest abdomen pelvis showed bilateral pyelonephritis without obstruction or stone, mild proctocolitis. Continue empiric antibiotics.  Sepsis appears resolved at this point.  Can downgrade to medical bed.  Follow-up culture data.  Acute on chronic anemia with microcytosis Check anemia panel, CBC in a.m.  Rib fractures sixth, seventh healing  Diabetes mellitus type 2 Monitor for hypo and hyperglycemia.  Hold metformin .  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  Resume daily prednisone . Stress dose steroids  Essential hypertension Hold Toprol -XL until blood pressure normalizes  Chronic pain Can resume chronic pain medications Resume Linzess   UDS positive for THC  New pulmonary nodules Outpatient follow-up with pulmonology   Esophagitis       Subjective:  Feels okay today, no back pain, breathing  fine.  Physical Exam: Vitals:   03/09/24 0548 03/09/24 0600 03/09/24 0744 03/09/24 0913  BP: 95/67 (!) 108/58 (!) 100/58   Pulse:  77 73   Resp:  14 18   Temp:   98.1 F (36.7 C)   TempSrc:   Oral   SpO2:  100% 97%   Weight:    72.6 kg   Physical Exam Vitals reviewed.  Constitutional:      General: He is not in acute distress.    Appearance: He is ill-appearing (Chronically). He is not toxic-appearing.  Cardiovascular:     Rate and Rhythm: Normal rate and regular rhythm.     Heart sounds: No murmur heard. Pulmonary:     Effort: Pulmonary effort is normal. No respiratory distress.     Breath sounds: No wheezing, rhonchi or rales.  Abdominal:     Tenderness: There is no right CVA tenderness or left CVA tenderness.  Musculoskeletal:     Left lower leg: No edema.     Comments: Right AKA  Skin:    Findings: Erythema (Chronic left lower extremity erythema) present.     Comments: Ulcers noted 1st and 2nd toe on the left.  Neurological:     Mental Status: He is alert.  Psychiatric:        Mood and Affect: Mood normal.        Behavior: Behavior normal.     Data Reviewed: Borderline hypoglycemia 69 this a.m. Potassium 3.1, alkaline phosphatase improved 148, AST down to 42 WBC 15.0, no significant change Hemoglobin down to 19.9 Lactic acid has normalized  Family Communication: none present  Disposition: Status is: Inpatient Remains inpatient appropriate because: sepsi  Planned Discharge Destination: Home    Time spent: 35 minutes  Author: Toribio Door, MD 03/09/2024 10:16 AM  For on call review [email or web address].  "

## 2024-03-09 NOTE — H&P (Addendum)
 " History and Physical    Brian Zavala FMW:[id number] DOB: 06/13/1950 DOA: 03/08/2024  Patient coming from: Home.  Chief Complaint: Weakness.  HPI: Brian Zavala is a 74 y.o. male with history of diabetes mellitus type 2, peripheral arterial disease, rheumatoid arthritis, prior stroke, hypertension, anemia who was admitted in the month of August 2025 for sepsis secondary to Klebsiella bacteremia from UTI per patient's sister Ms. Trudy who provided most of the history patient has been feeling weak last few days.  About 7 days ago patient had 3 days of watery diarrhea multiple episodes which resolved without any intervention.  Following that episode patient has become very weak and fatigued.  He had couple of falls but did not lose consciousness.  He was having some lower abdominal discomfort following the diarrheal episodes.  Due to persistent weakness EMS was called and brought to the ER.  Per patient's sister patient has not been eating well last few days.  Patient has not been taking his antiviral due to patient having chronic skin lesions of the lower extremity.  He also has a erythema of the left lower extremity and warm on touching which patient sister states that he has been having it for a long time.  ED Course: In the ER patient blood pressure was in the low normal was given fluid bolus for possible sepsis.  Labs showed WBC of 14.2 proBNP of 1200 CT chest abdomen pelvis shows features concerning for bilateral pyelonephritis and also evidence of proctocolitis.  Left-sided 6th and 7th rib fracture.  Pulmonary nodules.  CT head is unremarkable.  Lactic acid was 3 improved to 2.3 after bolus.  Creatinine is 0.9 alk phos 201 AST 65.  Review of Systems: As per HPI, rest all negative.   Past Medical History:  Diagnosis Date   Arthritis    pretty much all over   Cellulitis of left foot    Colitis    COPD (chronic obstructive pulmonary disease) (HCC)    Dyspnea    Hx of BKA, right  (HCC)    Hyperlipidemia    Hypertension    Nausea and vomiting 08/20/2022   Neuromuscular disorder (HCC)    neuropathy legs   Neuropathy    legs   Osteomyelitis (HCC) 04/2018   4th toe left foot   Osteomyelitis of fourth toe of left foot (HCC) 04/19/2018   Peripheral vascular disease    Rheumatoid arthritis (HCC)    Sleep apnea    does not use a cpap   Stroke (HCC)    Type II diabetes mellitus (HCC)    Wears glasses    Wears partial dentures    top and bottom partials    Past Surgical History:  Procedure Laterality Date   ABDOMINAL AORTOGRAM N/A 12/23/2023   Procedure: ABDOMINAL AORTOGRAM;  Surgeon: Serene Gaile ORN, MD;  [facility]: [facility];  Service: Cardiovascular;  Laterality: N/A;   ABDOMINAL AORTOGRAM W/LOWER EXTREMITY Bilateral 10/19/2018   Procedure: ABDOMINAL AORTOGRAM W/LOWER EXTREMITY;  Surgeon: Court Dorn PARAS, MD;  Location: [facility];  Service: Cardiovascular;  Laterality: Bilateral;   ABOVE KNEE LEG AMPUTATION Right 11/27/2018   AMPUTATION Left 04/22/2018   Procedure: LEFT FOOT 4TH RAY AMPUTATION;  Surgeon: Harden Jerona GAILS, MD;  Location: [facility] OR;  Service: Orthopedics;  Laterality: Left;   AMPUTATION Right 11/27/2018   Procedure: RIGHT ABOVE KNEE AMPUTATION;  Surgeon: Harden Jerona GAILS, MD;  Location: [facility] OR;  Service: Orthopedics;  Laterality: Right;   BACK  SURGERY     BIOPSY  04/21/2023   Procedure: BIOPSY;  Surgeon: Charlanne Groom, MD;  Location: [facility];  Service: Gastroenterology;;   CHOLECYSTECTOMY N/A 10/26/2013   Procedure: LAPAROSCOPIC CHOLECYSTECTOMY WITH INTRAOPERATIVE CHOLANGIOGRAM;  Surgeon: Debby LABOR. Cornett, MD;  Location: [facility];  Service: General;  Laterality: N/A;   COLONOSCOPY     COLONOSCOPY WITH PROPOFOL  N/A 04/21/2023   Procedure: COLONOSCOPY WITH PROPOFOL ;  Surgeon: Charlanne Groom, MD;  Location: [facility];  Service: Gastroenterology;  Laterality: N/A;   CYST EXCISION Bilateral 02/15/2015   Procedure: LEFT INDEX FINGER AND  RIGHT MIDDLE FINGER NODULE EXCISION;  Surgeon: Kay CHRISTELLA Cummins, MD;  Location: [facility];  Service: Orthopedics;  Laterality: Bilateral;   CYST EXCISION PERINEAL N/A 12/08/2012   Procedure: CYST EXCISION PERINeum;  Surgeon: Debby A. Cornett, MD;  Location: [facility];  Service: General;  Laterality: N/A;   ESOPHAGOGASTRODUODENOSCOPY (EGD) WITH PROPOFOL  N/A 04/21/2023   Procedure: ESOPHAGOGASTRODUODENOSCOPY (EGD) WITH PROPOFOL ;  Surgeon: Charlanne Groom, MD;  Location: [facility];  Service: Gastroenterology;  Laterality: N/A;   FOOT AMPUTATION Right 2005   I & D EXTREMITY Right 03/07/2017   Procedure: EXCISION FIBULAR HEAD RIGHT BELOW KNEE AMPUTATION;  Surgeon: Harden Jerona GAILS, MD;  Location: [facility] OR;  Service: Orthopedics;  Laterality: Right;   INCISE AND DRAIN ABCESS  12/02/2014   PERINEAL ABSCESS   INCISION AND DRAINAGE PERIRECTAL ABSCESS Left 12/02/2014   Procedure: IRRIGATION AND DEBRIDEMENT PERINEAL ABSCESS;  Surgeon: Vicenta Poli, MD;  [facility];  Service: General;  Laterality: Left;   LEG AMPUTATION BELOW KNEE Right 2005   LOWER EXTREMITY ANGIOGRAPHY N/A 05/05/2017   Procedure: LOWER EXTREMITY ANGIOGRAPHY;  Surgeon: Court Dorn PARAS, MD;  [facility];  Service: Cardiovascular;  Laterality: N/A;   LOWER EXTREMITY ANGIOGRAPHY N/A 12/23/2023   Procedure: Lower Extremity Angiography;  Surgeon: Serene Gaile ORN, MD;  [facility];  Service: Cardiovascular;  Laterality: N/A;   LOWER EXTREMITY INTERVENTION N/A 12/23/2023   Procedure: LOWER EXTREMITY INTERVENTION;  Surgeon: Serene Gaile ORN, MD;  [facility]: [facility];  Service: Cardiovascular;  Laterality: N/A;   LUMBAR DISC SURGERY  82,90   ruptured disc   PERIPHERAL VASCULAR INTERVENTION Right 05/05/2017   Procedure: PERIPHERAL VASCULAR INTERVENTION;  Surgeon: Court Dorn PARAS, MD;  [facility];  Service: Cardiovascular;  Laterality: Right;   PERIPHERAL  VASCULAR INTERVENTION Right 10/19/2018   Procedure: PERIPHERAL VASCULAR INTERVENTION;  Surgeon: Court Dorn PARAS, MD;  [facility];  Service: Cardiovascular;  Laterality: Right;   SCAR REVISION Right 2005   @ amputation   STUMP REVISION Right 08/21/2018   Procedure: REVISION RIGHT BELOW KNEE AMPUTATION, EXCISION FIBULA;  Surgeon: Harden Jerona GAILS, MD;  [facility];  Service: Orthopedics;  Laterality: Right;   STUMP REVISION Right 09/09/2018   Procedure: REVISION RIGHT BELOW KNEE AMPUTATION;  Surgeon: Harden Jerona GAILS, MD;  Location: [facility] OR;  Service: Orthopedics;  Laterality: Right;   TOE AMPUTATION Right 2005   TRANSCAROTID ARTERY REVASCULARIZATION  Left 09/04/2022   Procedure: Left Transcarotid Artery Revascularization;  Surgeon: Serene Gaile ORN, MD;  Location: [facility] OR;  Service: Vascular;  Laterality: Left;   ULTRASOUND GUIDANCE FOR VASCULAR ACCESS Right 09/04/2022   Procedure: ULTRASOUND GUIDANCE FOR VASCULAR ACCESS, RIGHT FEMORAL VEIN;  Surgeon: Serene Gaile ORN, MD;  Location[facility];  Service: Vascular;  Laterality: Right;     reports that he has been smoking cigarettes. He  started smoking about 59 years ago. He has a 58 pack-year smoking history. He has never used smokeless tobacco. He reports that he does not drink alcohol and does not use drugs.  Allergies[1]  Family History  Problem Relation Age of Onset   Hypothyroidism Mother    Diabetes Mother    Cirrhosis Father    Diabetes Maternal Grandmother    Thyroid disease Daughter     Prior to Admission medications  Medication Sig Start Date End Date Taking? Authorizing Provider  aspirin  81 MG chewable tablet Chew 81 mg by mouth in the morning.    [provider]  atorvastatin  (LIPITOR ) 80 MG tablet Take 1 tablet (80 mg total) by mouth daily at 6 PM. 05/06/17   Henry Manuelita NOVAK, NP  calcium  carbonate (OSCAL) 1500 (600 Ca) MG TABS tablet Take 600 mg of elemental calcium  by mouth daily with breakfast.      [provider]  clopidogrel  (PLAVIX ) 75 MG tablet Take 1 tablet (75 mg total) by mouth daily. 09/02/22   Singh, Prashant K, MD  Cyanocobalamin  (VITAMIN B-12 PO) Take 1 tablet by mouth daily.    [provider]  etanercept  (ENBREL ) 50 MG/ML injection Inject 50 mg into the skin every Sunday.    [provider]  hydrochlorothiazide (MICROZIDE) 12.5 MG capsule Take 12.5 mg by mouth daily.    [provider]  insulin aspart (NOVOLOG) 100 UNIT/ML injection Inject 2-12 Units into the skin 4 (four) times daily -  before meals and at bedtime. Inject 2-12 units subcutaneously based on sliding scale:  131-180   2 units 181-240   4 units 241-300   6 units 301-350   8 units 351-400   10 units >400        12  units    [provider]  insulin  glargine (SEMGLEE ) 100 UNIT/ML injection Inject 0.7 mLs (70 Units total) into the skin daily. 10/23/23   Motwani, Komal, MD  leflunomide  (ARAVA ) 20 MG tablet Take 20 mg by mouth daily.    [provider]  linaclotide  (LINZESS ) 290 MCG CAPS capsule Take 290 mcg by mouth daily before breakfast.    [provider]  metFORMIN  (GLUCOPHAGE -XR) 500 MG 24 hr tablet Take 1,000 mg by mouth 2 (two) times daily with a meal.    [provider]  metoCLOPramide  (REGLAN ) 5 MG tablet Take 5 mg by mouth 4 (four) times daily as needed for nausea or vomiting.    [provider]  metoprolol  succinate (TOPROL -XL) 25 MG 24 hr tablet Take 25 mg by mouth at bedtime.    [provider]  morphine  (MS CONTIN ) 100 MG 12 hr tablet Take 100 mg by mouth every 12 (twelve) hours.    [provider]  ondansetron  (ZOFRAN ) 4 MG tablet Take 1 tablet (4 mg total) by mouth every 6 (six) hours. 08/18/23   Cleotilde Rogue, MD  oxymetazoline  (AFRIN) 0.05 % nasal spray Place 1 spray into both nostrils 2 (two) times daily as needed for congestion.    [provider]  pantoprazole  (PROTONIX ) 40 MG tablet Take 1  tablet (40 mg total) by mouth daily before breakfast. Patient taking differently: Take 40 mg by mouth daily as needed (indigestion/heartburn.). 10/28/22   McMichael, Nestor HERO, PA-C  predniSONE  (DELTASONE ) 1 MG tablet Take 2 mg by mouth daily with breakfast.    [provider]  pregabalin  (LYRICA ) 150 MG capsule Take 150 mg by mouth 2 (two) times daily.    [provider]  promethazine  (PHENERGAN ) 25 MG tablet Take 25 mg by mouth every 8 (eight) hours as needed for nausea or vomiting.    [provider]    Physical Exam: Constitutional: Moderately built and nourished. Vitals:   03/08/24 2315 03/09/24 0015 03/09/24 0135 03/09/24 0215  BP: 118/79 93/61 109/69 105/68  Pulse: 94 (!) 111 (!) 103   Resp: 16 16 16    Temp:  (!) 97.5 F (36.4 C)    TempSrc:  Oral    SpO2: 98% 91% 92%    Eyes: Anicteric no pallor. ENMT: No discharge from the ears eyes nose or mouth. Neck: No mass felt.  No neck rigidity. Respiratory: No rhonchi or crepitations. Cardiovascular: S1-S2 heard. Abdomen: Soft nontender bowel sound present. Musculoskeletal: Redness in the left lower extremity.  Right BKA.  Stump looks clean. Skin: Erythema of the left lower extremity. Neurologic: Alert awake oriented to place and person appears mildly confused moving all extremities. Psychiatric: Mildly confused.   Labs on Admission: I have personally reviewed following labs and imaging studies  CBC: Recent Labs  Lab 03/08/24 2136 03/08/24 2152  WBC 14.2*  --   NEUTROABS 11.6*  --   HGB 11.1* 11.6*  HCT 34.3* 34.0*  MCV 78.5*  --   PLT 377  --    Basic Metabolic Panel: Recent Labs  Lab 03/08/24 2136 03/08/24 2152  NA 133* 133*  K 4.2 3.9  CL 95* 94*  CO2 26  --   GLUCOSE 85 81  BUN 12 15  CREATININE 0.92 0.90  CALCIUM  8.7*  --   MG 1.7  --    GFR: CrCl cannot be calculated (Unknown ideal weight.). Liver Function Tests: Recent Labs  Lab 03/08/24 2136  AST 65*  ALT 32  ALKPHOS  201*  BILITOT 0.5  PROT 7.0  ALBUMIN 3.0*   No results for input(s): LIPASE, AMYLASE in the last 168 hours. No results for input(s): AMMONIA in the last 168 hours. Coagulation Profile: No results for input(s): INR, PROTIME in the last 168 hours. Cardiac Enzymes: No results for input(s): CKTOTAL, CKMB, CKMBINDEX, TROPONINI in the last 168 hours. BNP (last 3 results) Recent Labs    03/08/24 2136  PROBNP 1,280.0*   HbA1C: No results for input(s): HGBA1C in the last 72 hours. CBG: No results for input(s): GLUCAP in the last 168 hours. Lipid Profile: No results for input(s): CHOL, HDL, LDLCALC, TRIG, CHOLHDL, LDLDIRECT in the last 72 hours. Thyroid Function Tests: No results for input(s): TSH, T4TOTAL, FREET4, T3FREE, THYROIDAB in the last 72 hours. Anemia Panel: No results for input(s): VITAMINB12, FOLATE, FERRITIN, TIBC, IRON, RETICCTPCT in the last 72 hours. Urine analysis:    Component Value Date/Time   COLORURINE YELLOW 03/08/2024 0018   APPEARANCEUR HAZY (A) 03/08/2024 0018   LABSPEC 1.005 03/08/2024 0018   PHURINE 6.0 03/08/2024 0018   GLUCOSEU NEGATIVE 03/08/2024 0018   HGBUR SMALL (A) 03/08/2024 0018   BILIRUBINUR NEGATIVE 03/08/2024 0018   KETONESUR NEGATIVE 03/08/2024 0018   PROTEINUR NEGATIVE 03/08/2024 0018   NITRITE NEGATIVE 03/08/2024 0018   LEUKOCYTESUR LARGE (A) 03/08/2024 0018   Sepsis Labs: @LABRCNTIP (procalcitonin:4,lacticidven:4) ) Recent Results (from the past 240 hours)  Blood Culture (routine x 2)     Status: None (Preliminary result)   Collection Time: 03/08/24  9:25 PM   Specimen: BLOOD RIGHT ARM  Result Value Ref Range Status   Specimen Description   Final    BLOOD RIGHT ARM Performed at [facility],  [address], [place], [place] [address]    Special Requests   Final    BOTTLES DRAWN AEROBIC AND ANAEROBIC Blood Culture adequate volume Performed at [facility], [address] [place], [place] [address]    Culture PENDING  Incomplete   Report Status PENDING  Incomplete  Resp panel by RT-PCR (RSV, Flu A&B, Covid) Anterior Nasal Swab     Status: None   Collection Time: 03/08/24  9:36 PM   Specimen: Anterior Nasal Swab  Result Value Ref Range Status   SARS Coronavirus 2 by RT PCR NEGATIVE NEGATIVE Final    Comment: (NOTE) SARS-CoV-2 target nucleic acids are NOT DETECTED.  The SARS-CoV-2 RNA is generally detectable in upper respiratory specimens during the acute phase of infection. The lowest concentration of SARS-CoV-2 viral copies this assay can detect is 138 copies/mL. A negative result does not preclude SARS-Cov-2 infection and should not be used as the sole basis for treatment or other patient management decisions. A negative result may occur with  improper specimen collection/handling, submission of specimen other than nasopharyngeal swab, presence of viral mutation(s) within the areas targeted by this assay, and inadequate number of viral copies(<138 copies/mL). A negative result must be combined with clinical observations, patient history, and epidemiological information. The expected result is Negative.  Fact Sheet for Patients:  bloggercourse.com  Fact Sheet for Healthcare Providers:  seriousbroker.it  This test is no t yet approved or cleared by the [place]  FDA and  has been authorized for detection and/or diagnosis of SARS-CoV-2 by FDA under an Emergency Use Authorization (EUA). This EUA will remain  in effect (meaning this test can be used) for the duration of the COVID-19 declaration under Section 564(b)(1) of the Act, 21 U.S.C.section 360bbb-3(b)(1), unless the authorization is terminated  or revoked sooner.       Influenza A by PCR NEGATIVE NEGATIVE Final   Influenza B by PCR NEGATIVE NEGATIVE Final    Comment: (NOTE) The Xpert Xpress SARS-CoV-2/FLU/RSV  plus assay is intended as an aid in the diagnosis of influenza from Nasopharyngeal swab specimens and should not be used as a sole basis for treatment. Nasal washings and aspirates are unacceptable for Xpert Xpress SARS-CoV-2/FLU/RSV testing.  Fact Sheet for Patients: bloggercourse.com  Fact Sheet for Healthcare Providers: seriousbroker.it  This test is not yet approved or cleared by the [place]  FDA and has been authorized for detection and/or diagnosis of SARS-CoV-2 by FDA under an Emergency Use Authorization (EUA). This EUA will remain in effect (meaning this test can be used) for the duration of the COVID-19 declaration under Section 564(b)(1) of the Act, 21 U.S.C. section 360bbb-3(b)(1), unless the authorization is terminated or revoked.     Resp Syncytial Virus by PCR NEGATIVE NEGATIVE Final    Comment: (NOTE) Fact Sheet for Patients: bloggercourse.com  Fact Sheet for Healthcare Providers: seriousbroker.it  This test is not yet approved or cleared by the [place]  FDA and has been authorized for detection and/or diagnosis of SARS-CoV-2 by FDA under an Emergency Use Authorization (EUA). This EUA will remain in effect (meaning this test can be used) for the duration of the COVID-19 declaration under Section 564(b)(1) of the Act, 21 U.S.C. section 360bbb-3(b)(1), unless the authorization is terminated or revoked.  Performed at [facility], [address], [place], [place] [address]   Blood Culture (routine x 2)     Status: None (Preliminary result)   Collection Time: 03/08/24  9:45 PM   Specimen:  BLOOD LEFT ARM  Result Value Ref Range Status   Specimen Description   Final    BLOOD LEFT ARM Performed at [facility], [address] [place], [place] [address]    Special Requests   Final    BOTTLES DRAWN AEROBIC AND ANAEROBIC Blood  Culture results may not be optimal due to an inadequate volume of blood received in culture bottles Performed at [facility], [address], [place], [place] [address]    Culture PENDING  Incomplete   Report Status PENDING  Incomplete     Radiological Exams on Admission: CT CHEST ABDOMEN PELVIS W CONTRAST Result Date: 03/09/2024 EXAM: CT CHEST, ABDOMEN AND PELVIS WITH CONTRAST 03/09/2024 12:10:05 AM TECHNIQUE: CT of the chest, abdomen and pelvis was performed with the administration of 100 mL of iohexol  (OMNIPAQUE ) 300 MG/ML solution. Multiplanar reformatted images are provided for review. Automated exposure control, iterative reconstruction, and/or weight based adjustment of the mA/kV was utilized to reduce the radiation dose to as low as reasonably achievable. COMPARISON: AP portable chest 03/08/2024, CTA abdomen pelvis 10/27/2023, CT abdomen and pelvis with IV contrast 08/18/2023, and lung cancer screening chest CT without contrast 03/22/2022. CLINICAL HISTORY: 74 year old presents with sepsis workup, weakness for 1 week, fell twice today. Watery loose stool x2 today. No report of urinary symptoms. FINDINGS: CHEST: MEDIASTINUM AND LYMPH NODES: Heart: The coronary arteries are heavily calcified. Scattered calcifications and slight thickening noted across the aortic valve leaflets. No pericardial effusion. Vessels: The aorta and great vessels are heavily calcified. There is high-grade calcific origin stenosis of the right subclavian artery and at least a 60% origin stenosis of the left subclavian artery. There is no aortic aneurysm, dissection, or stenosis. The pulmonary arteries and veins are normal caliber. Esophagus: There is mild thickening in the thoracic esophagus suggesting esophagitis. No mass-like thickening is evident. Lymph Nodes: There are shotty up to borderline-sized mediastinal and hilar lymph nodes with no interval change being seen. The axillae are clear. LUNGS AND  PLEURA: Diaphragm: Mild chronic elevation again noted of the right hemidiaphragm. Lung Parenchyma: There is mild interlobular septal thickening in the lung apices not seen previously suggesting slight interstitial edema. 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 There is no active infiltrate. New nodules are noted with a subpleural 5 mm right upper lobe nodule posteriorly on series 5 axial 44, 3 mm right middle lobe nodule medially axial 91, 3 mm left upper lobe nodule on axial image 48, and a 2 mm left lower lobe nodule on image 77. Pleura: There are trace pleural effusions. No pneumothorax. BONES AND SOFT TISSUES (CHEST): There is osteopenia. Advanced degenerative disc disease mid to lower thoracic spine with spondylosis. Chronic mild upper plate wedge compression fracture of the T11 vertebral body. Recent but subacute nondisplaced fractures of the anterolateral left 6th and 7th ribs, small amount of callus at the fracture sites. The rib cage is otherwise intact. ABDOMEN AND PELVIS: LIVER: Unremarkable. GALLBLADDER AND BILE DUCTS: Gallbladder surgically absent as before without biliary dilatation. SPLEEN: No acute abnormality. PANCREAS: No acute abnormality. ADRENAL GLANDS: No acute abnormality. No adrenal mass. KIDNEYS, URETERS AND BLADDER: Kidneys: Both initial and delayed images demonstrate patchy hypoenhancement over portions of both kidneys with adjacent stranding changes, findings consistent with bilateral pyelonephritis. There is a 2.4 cm Bosniak I cyst in the posterior right kidney. Per consensus,  no follow-up is needed for simple Bosniak type 1 and 2 renal cysts, unless the patient has a malignancy history or risk factors. No renal mass. Ureters: There is no urinary stone or obstruction. No periureteral stranding.  Bladder: The bladder is distended and otherwise unremarkable. GI AND BOWEL: Stomach: Mild fluid filling in the stomach without wall thickening. Duodenum: There is a 4 cm periampullary diverticulum of the descending duodenum. Small Bowel: The small bowel is of normal caliber. Appendix: The appendix is normal. Colon/Rectum: There is mucosal enhancement in the colon and rectum suggesting mild proctocolitis. Faint stranding changes are present over the distal descending segment. Other: There is no bowel obstruction. No pneumatosis. REPRODUCTIVE ORGANS: The prostate is not enlarged. PERITONEUM AND RETROPERITONEUM: No ascites. No free air. No free hemorrhage. There are small inguinal fat hernias. VASCULATURE: There is heavy aortic and branch vessel atherosclerosis, flow limiting stenoses in the common iliac and internal iliac arteries and distal external iliac arteries. ABDOMINAL AND PELVIS LYMPH NODES: There are chronic calcified retroperitoneal lymph nodes. No noncalcified adenopathy. BONES AND SOFT TISSUES (ABDOMEN/PELVIS): Soft Tissues: Chronic post-injection changes in the subcutaneous abdominal wall left greater than right. Osseous: There is osteopenia. Advanced lumbar degenerative changes from L3 down. Acquired spinal stenosis L3-L4 and L4-L5 with L5 and S1 decompression laminectomy previously performed. Transitional L5 noted with left hemisacralization. Chronic grade 1 anterolisthesis L3-L4. No acute or other significant osseous findings. IMPRESSION: 1. Bilateral pyelonephritis. No urinary stone or obstruction. 2. Mild proctocolitis with faint stranding changes over the distal descending segment. 3. Recent but subacute nondisplaced fractures of the anterolateral left 6th and 7th ribs with small amount of callus at the fracture sites. 4. New pulmonary nodules. The largest is 5 mm in the right upper lobe. Continue with annual low dose lung cancer screening CT program . 5. Evidence consistent with slight interstitial  edema in the apical lungs, there are trace pleural effusions. Noncardiogenic etiology favored. 6. Mild thickening in the esophagus consistent with esophagitis. 7. Subacute but recent-appearing fractures of the left anterolateral 6th and 7th ribs with minimal callus. . Electronically signed by: Francis Quam MD 03/09/2024 12:58 AM EST RP Workstation: [id number]   DG Tibia/Fibula Left Result Date: 03/08/2024 CLINICAL DATA:  Cellulitis weakness EXAM: LEFT TIBIA AND FIBULA - 2 VIEW COMPARISON:  None Available. FINDINGS: No fracture or malalignment. No periostitis or osseous destructive change. Vascular stent posterior to the knee. Degenerative changes at the ankle IMPRESSION: No acute osseous abnormality. Electronically Signed   By: Luke Bun M.D.   On: 03/08/2024 23:44   DG Chest Port 1 View Result Date: 03/08/2024 EXAM: 1 VIEW(S) XRAY OF THE CHEST 03/08/2024 09:48:00 PM COMPARISON: Chest x-ray dated 10/30/2018. CLINICAL HISTORY: Questionable sepsis - evaluate for abnormality FINDINGS: LUNGS AND PLEURA: Low lung volumes. No pleural effusion. No pneumothorax. Interstitial opacities in the lower lungs worrisome for edema. HEART AND MEDIASTINUM: The heart is mildly enlarged. BONES AND SOFT TISSUES: No acute osseous abnormality. IMPRESSION: 1. Lower lung interstitial opacities, concerning for pulmonary edema. 2. Mild cardiomegaly. Electronically signed by: Greig Pique MD 03/08/2024 09:53 PM EST RP Workstation: [id number]    EKG: Independently reviewed.  Normal sinus rhythm LVH.  Assessment/Plan Principal Problem:   Sepsis (HCC) Active Problems:   Colitis   Essential hypertension   Type 2 diabetes mellitus with diabetic neuropathy, with long-term current use of insulin  (HCC)   S/P unilateral BKA (below knee amputation) (HCC)   Rheumatoid arthritis (HCC)   CVA (cerebral vascular accident) (HCC)   Acute  pyelonephritis   Acute encephalopathy   Anemia    Sepsis likely source could be bilateral  pyelonephritis and also proctocolitis.  There are some erythema in the left lower extremity which is concerning but patient's sister states that has been chronic.  Will continue with empiric antibiotics follow cultures continue IV fluids.  Stool studies if there is further diarrhea. Acute encephalopathy likely from sepsis.  CT head is unremarkable.  Follows commands.  May have to hold Lyrica  and morphine  sulfate which patient takes for pain if continues to remain confused. Diabetes mellitus type 2 takes insulin  Semglee  72 units in the morning along with sliding scale coverage.  I have decreased by half since patient has not been eating well recently.  Last hemoglobin A1c was 7.5 about 4 months ago. Hypertension hold metoprolol  and hydrochlorothiazide  blood pressure is in the low normal. History of rheumatoid arthritis on prednisone  2 mg daily along with Arava .  Patient stopped taking Enbrel  about 4 months ago with concern for chronic wound of the left lower extremity.  Will keep patient on stress dose IV hydrocortisone .  Change back to prednisone  once patient stable. Anemia of chronic disease follow CBC. History of peripheral artery disease and CVA on aspirin  statins and Plavix .  Dr. Serene, vascular surgeon had done intervention on 12/23/2023 for left lower extremity when angioplasty and stents were placed.  This was done for chronic wound of the left leg. Chronic pain on morphine  extended release per patient's sister patient takes 80 mg twice daily along with Lyrica .  Morphine  dose was recently decreased by primary care physician.  See #2.  Pharmacy does not carry the morphine  which patient takes patient's sister is bringing it. Nondisplaced rib fractures 6th and 7th on the left side appears to be healing..  Incentive spirometer. Mildly elevated LFTs.  Follow LFTs. Mild hypokalemia replace and recheck. Pulmonary nodules will need follow-up as outpatient. Esophagitis seen on the CAT scan will keep  patient on Protonix .  Since patient has sepsis secondary close morning further workup and more than 2 midnight stay.  DVT prophylaxis: Lovenox . Code Status: Full code. Family Communication: Patient's sister Ms. Williams. Disposition Plan: Progressive care. Consults called: None. Admission status: Patient.         [1]  Allergies Allergen Reactions   Chantix [Varenicline] Nausea Only   "

## 2024-03-09 NOTE — Hospital Course (Addendum)
 74 year old male PMH diabetes mellitus type 2, PAD, status post right AKA, presented with weakness, diarrhea.  Admitted for sepsis secondary to bilateral pyelonephritis, proctocolitis, there was also concern for left lower extremity cellulitis although sister reported this was chronic.  Consultants None   Procedures/Events 1/5 admit for sepsis

## 2024-03-09 NOTE — ED Provider Notes (Signed)
" °  Provider Note MRN:  [id number]  Arrival date & time: 03/09/2024    ED Course and Medical Decision Making  Assumed care of patient at sign-out or upon transfer.  Plan is for admission, concern for sepsis with likely source lower extremity cellulitis.  Having some abdominal discomfort with nausea vomiting awaiting CT imaging.  2 AM update: CT imaging reveals bilateral pyelonephritis, no other potential source for sepsis.  Accepted for admission by hospitalist service.  Procedures  Final Clinical Impressions(s) / ED Diagnoses     ICD-10-CM   1. Sepsis, due to unspecified organism, unspecified whether acute organ dysfunction present (HCC)  A41.9     2. Cellulitis of left lower extremity  L03.116     3. Pyelonephritis  N12       ED Discharge Orders     None       Discharge Instructions   None     Ozell HERO. Theadore, MD [facility] Emergency Medicine [facility] [place] [email or web address]    Theadore Ozell HERO, MD 03/09/24 [phone]  "

## 2024-03-10 ENCOUNTER — Other Ambulatory Visit (HOSPITAL_COMMUNITY): Payer: Self-pay

## 2024-03-10 DIAGNOSIS — A419 Sepsis, unspecified organism: Secondary | ICD-10-CM

## 2024-03-10 LAB — GLUCOSE, CAPILLARY
Glucose-Capillary: 199 mg/dL — ABNORMAL HIGH (ref 70–99)
Glucose-Capillary: 249 mg/dL — ABNORMAL HIGH (ref 70–99)
Glucose-Capillary: 266 mg/dL — ABNORMAL HIGH (ref 70–99)
Glucose-Capillary: 204 mg/dL — ABNORMAL HIGH (ref 70–99)

## 2024-03-10 LAB — RETICULOCYTES
Immature Retic Fract: 20.2 % — ABNORMAL HIGH (ref 2.3–15.9)
RBC.: 4.22 MIL/uL (ref 4.22–5.81)
Retic Count, Absolute: 63.3 K/uL (ref 19.0–186.0)
Retic Ct Pct: 1.5 % (ref 0.4–3.1)

## 2024-03-10 LAB — BASIC METABOLIC PANEL WITH GFR
Anion gap: 9 (ref 5–15)
BUN: 9 mg/dL (ref 8–23)
CO2: 27 mmol/L (ref 22–32)
Calcium: 8.5 mg/dL — ABNORMAL LOW (ref 8.9–10.3)
Chloride: 101 mmol/L (ref 98–111)
Creatinine, Ser: 0.62 mg/dL (ref 0.61–1.24)
GFR, Estimated: >60 mL/min
Glucose, Bld: 195 mg/dL — ABNORMAL HIGH (ref 70–99)
Potassium: 3.5 mmol/L (ref 3.5–5.1)
Sodium: 137 mmol/L (ref 135–145)

## 2024-03-10 LAB — CBC
HCT: 33.4 % — ABNORMAL LOW (ref 39.0–52.0)
Hemoglobin: 10.7 g/dL — ABNORMAL LOW (ref 13.0–17.0)
MCH: 25 pg — ABNORMAL LOW (ref 26.0–34.0)
MCHC: 32 g/dL (ref 30.0–36.0)
MCV: 78 fL — ABNORMAL LOW (ref 80.0–100.0)
Platelets: 345 K/uL (ref 150–400)
RBC: 4.28 MIL/uL (ref 4.22–5.81)
RDW: 17.3 % — ABNORMAL HIGH (ref 11.5–15.5)
WBC: 10.9 K/uL — ABNORMAL HIGH (ref 4.0–10.5)
nRBC: 0 % (ref 0.0–0.2)

## 2024-03-10 LAB — VITAMIN B12: Vitamin B-12: 924 pg/mL — ABNORMAL HIGH (ref 180–914)

## 2024-03-10 LAB — IRON AND TIBC
Iron: 16 ug/dL — ABNORMAL LOW (ref 45–182)
Saturation Ratios: 8 % — ABNORMAL LOW (ref 17.9–39.5)
TIBC: 207 ug/dL — ABNORMAL LOW (ref 250–450)
UIBC: 191 ug/dL

## 2024-03-10 LAB — C DIFFICILE QUICK SCREEN W PCR REFLEX
C Diff antigen: POSITIVE — AB
C Diff interpretation: DETECTED
C Diff toxin: POSITIVE — AB

## 2024-03-10 LAB — FERRITIN: Ferritin: 186 ng/mL (ref 24–336)

## 2024-03-10 LAB — FOLATE: Folate: 13.5 ng/mL

## 2024-03-10 MED ORDER — LEFLUNOMIDE 10 MG PO TABS
20.0000 mg | ORAL_TABLET | Freq: Every day | ORAL | Status: DC
  Filled 2024-03-10: qty 2

## 2024-03-10 MED ORDER — SODIUM CHLORIDE 0.9 % IV SOLN: INTRAVENOUS | Status: DC

## 2024-03-10 MED ORDER — ONDANSETRON HCL 4 MG/2ML IJ SOLN
4.0000 mg | Freq: Four times a day (QID) | INTRAMUSCULAR | Status: AC | PRN
  Administered 2024-03-10 – 2024-03-11 (×2): 4 mg via INTRAVENOUS
  Filled 2024-03-10 (×2): qty 2

## 2024-03-10 MED ORDER — VANCOMYCIN HCL 125 MG PO CAPS
125.0000 mg | ORAL_CAPSULE | Freq: Four times a day (QID) | ORAL | Status: DC
  Administered 2024-03-10 – 2024-03-11 (×4): 125 mg via ORAL
  Filled 2024-03-10 (×7): qty 1

## 2024-03-10 MED ORDER — METOPROLOL SUCCINATE ER 25 MG PO TB24
25.0000 mg | ORAL_TABLET | Freq: Every evening | ORAL | Status: DC
  Administered 2024-03-10 – 2024-03-11 (×2): 25 mg via ORAL
  Filled 2024-03-10 (×3): qty 1

## 2024-03-10 MED ORDER — HYDROMORPHONE HCL 1 MG/ML IJ SOLN
0.5000 mg | INTRAMUSCULAR | Status: DC | PRN
  Administered 2024-03-10 – 2024-03-11 (×4): 0.5 mg via INTRAVENOUS
  Filled 2024-03-10 (×4): qty 0.5

## 2024-03-10 MED ORDER — ENOXAPARIN SODIUM 40 MG/0.4ML IJ SOSY
40.0000 mg | PREFILLED_SYRINGE | INTRAMUSCULAR | Status: DC
  Administered 2024-03-10 – 2024-03-12 (×3): 40 mg via SUBCUTANEOUS
  Filled 2024-03-10 (×3): qty 0.4

## 2024-03-10 NOTE — Progress Notes (Signed)
Positive for C. diff

## 2024-03-10 NOTE — Evaluation (Addendum)
 Physical Therapy Evaluation Patient Details Name: Brian Zavala MRN: [id number] DOB: [birth year] Today's Date: 03/10/2024  History of Present Illness  74 yo brought to Ed 03/09/24 after several falls, CT chest abdomen pelvis shows features concerning for bilateral pyelonephritis and also evidence of proctocolitis.  Left-sided 6th and 7th rib fracture.  Pulmonary nodules,generalized weakness, diarrhea, LLE erythema,fever.PMHx: COPD, alcohol usage, neuropathy, type 2 diabetes, L toe amputation, R AKA, HLD, HTN, arthritis, current smoker, noncompliant BiPAP.  Clinical Impression  Pt admitted with above diagnosis.  Pt currently with functional limitations due to the deficits listed below (see PT Problem List). Pt will benefit from acute skilled PT to increase their independence and safety with mobility to allow discharge.     The patient report feeling very weak.Patient reports independent with WC/power chair and short distance ambulation with RW at baseline, lives alone with close family support.  Patient should progress to return home. Patient may benefit from HHPT  SPo2 on 2 L 100%, On RA 95% at rest, and 955 on RA after transfer to recliner.  Patient was not strong enough to attempt ambulation this visit as well as patient relies on [facility] for primary mobility versus ambulation.       If plan is discharge home, recommend the following: Assistance with cooking/housework;Assist for transportation   Can travel by private vehicle        Equipment Recommendations None recommended by PT  Recommendations for Other Services    OT   Functional Status Assessment Patient has had a recent decline in their functional status and demonstrates the ability to make significant improvements in function in a reasonable and predictable amount of time.     Precautions / Restrictions Precautions Precautions: Fall Precaution/Restrictions Comments: RAKA Restrictions Weight Bearing Restrictions Per Provider  Order: No      Mobility  Bed Mobility Overal bed mobility: Independent                  Transfers Overall transfer level: Needs assistance Equipment used: None Transfers: Sit to/from Stand, Bed to chair/wheelchair/BSC       Squat pivot transfers: Min assist     General transfer comment: patient barely  stood enough to squat pivot over recliner armrest. offered to lower rest but  pt  declined.    Ambulation/Gait                  Stairs            Wheelchair Mobility     Tilt Bed    Modified Rankin (Stroke Patients Only)       Balance Overall balance assessment: History of Falls, Needs assistance   Sitting balance-Leahy Scale: Good     Standing balance support: During functional activity   Standing balance comment: only performed squat pivot transfer                             Pertinent Vitals/Pain Pain Assessment Pain Assessment: Faces Faces Pain Scale: Hurts even more Pain Location: all over Pain Descriptors / Indicators: Discomfort, Aching Pain Intervention(s): Monitored during session    Home Living Family/patient expects to be discharged to:: Private residence Living Arrangements: Alone Available Help at Discharge: Family;Friend(s);Available PRN/intermittently Type of Home: Mobile home Home Access: Ramped entrance       Home Layout: One level Home Equipment: [employer] (2 wheels);Wheelchair - power      Prior Function Prior Level of Function : Independent/Modified  Independent;Driving             Mobility Comments: Independent at w/c level and driving, reports he can use RW to  hop  room distances when he feels strong enough ADLs Comments: Independent     Extremity/Trunk Assessment   Upper Extremity Assessment Upper Extremity Assessment: Overall WFL for tasks assessed    Lower Extremity Assessment Lower Extremity Assessment: RLE deficits/detail;LLE deficits/detail RLE Deficits / Details:  AKA LLE Deficits / Details: grossly 4/5 strength    Cervical / Trunk Assessment Cervical / Trunk Assessment: Normal  Communication   Communication Communication: No apparent difficulties    Cognition Arousal: Alert Behavior During Therapy: WFL for tasks assessed/performed   PT - Cognitive impairments: No apparent impairments                         Following commands: Intact       Cueing       General Comments      Exercises     Assessment/Plan    PT Assessment Patient needs continued PT services  PT Problem List Decreased strength;Decreased knowledge of use of DME;Decreased activity tolerance;Decreased mobility       PT Treatment Interventions DME instruction;Gait training;Functional mobility training;Therapeutic activities;Therapeutic exercise;Balance training;Patient/family education    PT Goals (Current goals can be found in the Care Plan section)  Acute Rehab PT Goals Patient Stated Goal: return home PT Goal Formulation: With patient Time For Goal Achievement: 03/24/24 Potential to Achieve Goals: Good    Frequency Min 3X/week     Co-evaluation               AM-PAC PT 6 Clicks Mobility  Outcome Measure Help needed turning from your back to your side while in a flat bed without using bedrails?: None Help needed moving from lying on your back to sitting on the side of a flat bed without using bedrails?: None Help needed moving to and from a bed to a chair (including a wheelchair)?: A Little Help needed standing up from a chair using your arms (e.g., wheelchair or bedside chair)?: A Little Help needed to walk in hospital room?: Total Help needed climbing 3-5 steps with a railing? : Total 6 Click Score: 16    End of Session   Activity Tolerance: Patient limited by fatigue Patient left: in chair;with call bell/phone within reach;with chair alarm set Nurse Communication: Mobility status PT Visit Diagnosis: Unsteadiness on feet  (R26.81);Other abnormalities of gait and mobility (R26.89);Repeated falls (R29.6);History of falling (Z91.81);Pain Pain - Right/Left: Left Pain - part of body: Leg    Time: 9051-8994 PT Time Calculation (min) (ACUTE ONLY): 17 min   Charges:   PT Evaluation $PT Eval Low Complexity: 1 Low   PT General Charges $$ ACUTE PT VISIT: 1 Visit         Darice Potters PT Acute Rehabilitation Services Office [phone]   Potters Darice Norris 03/10/2024, 12:56 PM

## 2024-03-10 NOTE — Progress Notes (Signed)
 " PROGRESS NOTE  Brian Zavala  DOB: [birth year]  PCP: FORBES Nola Raisin, [place] FMW:[id number]  DOA: 03/08/2024  LOS: 1 day  Hospital Day: 3  Subjective: Patient was seen and examined this afternoon. Elderly Caucasian male.  Lying on bed.  Looks dehydrated.  Sister at bedside. Patient continues to have diarrhea.  C. difficile positive this morning In the last 24 hours, afebrile, hemodynamically stable, breathing on room air Labs from this morning blood count 10.9, blood sugar level has been persistently close to 200  Brief narrative: Brian Zavala is a 74 y.o. male with PMH significant for DM2, HTN, HLD, PAD s/p right AKA, stroke, COPD, neuropathy, rheumatoid arthritis, chronic anemia. Recently hospitalized in August 2025 for sepsis secondary to Significantly from UTI.  1/5, patient presented with 7 days of progressive weakness that started after 3 days of watery diarrhea which self resolved but weakness persisted.  He has had couple of falls without loss of consciousness.  Appetite has been poor. There was also concern for left lower extremity cellulitis although sister reported this was chronic.  In the ED, blood pressure was low normal Met sepsis criteria with WBC count 14.2, lactic acid 3 CT chest abdomen pelvis showed  -bilateral pyelonephritis and also evidence of proctocolitis.   -Left-sided 6th and 7th rib fracture.    Resp virus panel negative Urinalysis showed hazy yellow urine with large leukocytes and many bacteria Urine drug screen positive for opiate and THC Urine culture blood culture collected Started on antibiotics Admitted to [facility]  Assessment and plan: Sepsis POA  Bilateral pyelonephritis  Presented with 7 days of progressive weakness after 3 days of self-limiting diarrhea. Met sepsis criteria with leukocytosis, lactic acidosis CT chest abdomen pelvis showed bilateral pyelonephritis and evidence of proctocolitis  urinalysis suggestive of infection Urine  culture, blood culture pending report Currently on IV Rocephin  and IV vancomycin  WBC count improving.  Lactic acid normalized. No fever.  Continue to monitor Recent Labs  Lab 03/08/24 2136 03/08/24 2152 03/08/24 2343 03/09/24 0427 03/09/24 0708 03/10/24 0518  WBC 14.2*  --   --  15.0*  --  10.9*  LATICACIDVEN  --  3.0* 2.3*  --  1.4  --    C. difficile pancolitis CT abdomen on presentation showed proctocolitis. CT chest today showed C. difficile antigen and toxin positive.  Continues to have diarrhea.  I have started the patient on oral vancomycin .  Acute encephalopathy Altered, weak secondary to sepsis,, diarrhea, dehydration, poor oral intake. CT head on admission unremarkable for acute findings. Lyrica  and morphine  sulfate were held on admission due to altered mentation.  Later resumed Mental status -alert, awake but slow to respond.  Type 2 diabetes mellitus A1c 7.5 on August 2025 PTA meds-Semglee  72 units a.m., metformin  1000 mg twice daily.  Also on chronic low-dose prednisone  for rheumatoid arthritis. Currently on Lantus .  It seems that the dose has been increased for 25 units this morning.  Continue to monitor with SSI/Accu-Cheks Recent Labs  Lab 03/09/24 1213 03/09/24 1621 03/09/24 2123 03/10/24 0736 03/10/24 1125  GLUCAP 173* 232* 201* 199* 249*   Hypertension PTA meds- Toprol  25 mg daily, hydrochlorothiazide  12.5 g daily. Blood pressure meds were initially held due to sepsis. Currently blood pressure stable in normal range with heart rate in 90s and 100s Resume Toprol .  Keep HCTZ on hold  H/o PAD s/p right AKA Chronic left lower extremity wound S/p left angioplasty and stents-12/2023 Continue aspirin , statin, Plavix  Continue to follow-up with vascular  surgery Continue wound care  H/o rheumatoid arthritis Chronic pain Chronic constipation PTA meds- prednisone  2 mg daily, leflunomide  20 mg daily.   Continue both.  Patient stopped taking Enbrel  about 4  months ago with concern of chronic wound in the left lower extremity. For pain control, patient was on extended release morphine  50 mg twice daily, Lyrica .  Pain meds were initially held. Pain regimen --- Scheduled: MS Contin  60 mg twice daily, Lyrica  150 mg twice daily --- PRN:  Continue bowel regimen with Linzess   Generalized weakness Falls  nondisplaced rib fractures Reportedly had falls in the last 7 days due to weakness. Imaging on admission showed 6th and 7th left rib fractures.  Appears to be healing PT eval ordered  Anemia of chronic disease Esophagitis Slightly low hemoglobin level but stable. Esophagitis seen on CT scan. Continue PPI, vitamin B12 Recent Labs    12/09/23 1416 12/23/23 0615 03/08/24 2136 03/08/24 2152 03/09/24 0427 03/10/24 0518  HGB  --  12.9* 11.1* 11.6* 9.9* 10.7*  MCV  --   --  78.5*  --  79.7* 78.0*  VITAMINB12 411  --   --   --   --  924*  FOLATE  --   --   --   --   --  13.5  FERRITIN 81.7  --   --   --   --  186  TIBC 284.2  --   --   --   --  207*  IRON 44  --   --   --   --  16*  RETICCTPCT  --   --   --   --   --  1.5   Hypokalemia Potassium level improved with replacement Recent Labs  Lab 03/08/24 2136 03/08/24 2152 03/09/24 0427 03/10/24 0518  NA 133* 133* 136 137  K 4.2 3.9 3.1* 3.5  CL 95* 94* 101 101  CO2 26  --  27 27  GLUCOSE 85 81 62* 195*  BUN 12 15 11 9   CREATININE 0.92 0.90 0.72 0.62  CALCIUM  8.7*  --  8.1* 8.5*  MG 1.7  --   --   --     Pulm nodules Seen in CT scan.  Follow-up as an outpatient for further imaging.   Nutrition Status:         Mobility: Was able to transfer himself in and out of wheelchair at home.  Will currently.  PT eval obtained.  PT Orders:   PT Follow up Rec: Home Health Pt1/09/2024 1252    Goals of care   Code Status: Full Code     DVT prophylaxis:  enoxaparin  (LOVENOX ) injection 40 mg Start: 03/10/24 1700   Antimicrobials: IV Rocephin , IV vancomycin , oral  vancomycin  Fluid: Looks dry.  Start NS at 75 mL/h in the setting of ongoing diarrhea Consultants: None Family Communication: Sister at bedside  Status: Inpatient Level of care:  Med-Surg   Patient is from: Home Needs to continue in-hospital care: Continues to have diarrhea.  C. difficile positive Anticipated d/c to: Pending clinical course   Diet:  Diet Order             Diet heart healthy/carb modified Room service appropriate? Yes; Fluid consistency: Thin  Diet effective now                   Scheduled Meds:  aspirin   81 mg Oral q AM   atorvastatin   80 mg Oral q1800   clopidogrel   75  mg Oral Daily   vitamin B-12  1,000 mcg Oral Daily   enoxaparin  (LOVENOX ) injection  40 mg Subcutaneous Q24H   insulin  aspart  0-9 Units Subcutaneous TID WC   insulin  glargine  25 Units Subcutaneous Daily   metoprolol  succinate  25 mg Oral QPM   morphine   60 mg Oral Q12H   pantoprazole   40 mg Oral Daily   predniSONE   2 mg Oral Q breakfast   pregabalin   150 mg Oral BID   vancomycin   125 mg Oral QID    PRN meds: acetaminophen  **OR** acetaminophen , HYDROmorphone  (DILAUDID ) injection   Infusions:   sodium chloride      cefTRIAXone  (ROCEPHIN )  IV 2 g (03/10/24 0002)   vancomycin  1,000 mg (03/10/24 1026)    Antimicrobials: Anti-infectives (From admission, onward)    Start     Dose/Rate Route Frequency Ordered Stop   03/10/24 1400  vancomycin  (VANCOCIN ) capsule 125 mg        125 mg Oral 4 times daily 03/10/24 1240 03/20/24 1359   03/09/24 2300  cefTRIAXone  (ROCEPHIN ) 2 g in sodium chloride  0.9 % 100 mL IVPB        2 g 200 mL/hr over 30 Minutes Intravenous Every 24 hours 03/09/24 0411     03/09/24 1100  vancomycin  (VANCOCIN ) IVPB 1000 mg/200 mL premix        1,000 mg 200 mL/hr over 60 Minutes Intravenous Every 12 hours 03/09/24 0421     03/08/24 2330  cefTRIAXone  (ROCEPHIN ) 2 g in sodium chloride  0.9 % 100 mL IVPB        2 g 200 mL/hr over 30 Minutes Intravenous Once 03/08/24  2319 03/09/24 0000   03/08/24 2330  vancomycin  (VANCOCIN ) IVPB 1000 mg/200 mL premix        1,000 mg 200 mL/hr over 60 Minutes Intravenous  Once 03/08/24 2319 03/09/24 0030       Objective: Vitals:   03/10/24 0630 03/10/24 1351  BP: 125/85 117/73  Pulse: 99 93  Resp: 18 18  Temp: 98 F (36.7 C) (!) 97.3 F (36.3 C)  SpO2: 92% 91%    Intake/Output Summary (Last 24 hours) at 03/10/2024 1609 Last data filed at 03/10/2024 1600 Gross per 24 hour  Intake 1777.17 ml  Output 1200 ml  Net 577.17 ml   Filed Weights   03/09/24 0913  Weight: 72.6 kg   Weight change:  Body mass index is 21.7 kg/m.   Physical Exam: General exam: Pleasant, elderly Caucasian male. Skin: No rashes, lesions or ulcers.  Looks dry HEENT: Atraumatic, normocephalic, no obvious bleeding Lungs: Clear to auscultation bilaterally,  CVS: S1, S2, no murmur,   GI/Abd: Soft, nontender, distended, tympanitic, bowel sound present,   CNS: Alert, awake, slow to respond Psychiatry: Looks depressed Extremities: No pedal edema, no calf tenderness, prior right AKA status  Data Review: I have personally reviewed the laboratory data and studies available.  F/u labs ordered Unresulted Labs (From admission, onward)     Start     Ordered   03/11/24 0500  CBC with Differential/Platelet  Tomorrow morning,   R        03/10/24 1608   03/11/24 0500  Basic metabolic panel with GFR  Tomorrow morning,   R        03/10/24 1608   03/10/24 0925  Gastrointestinal Panel by PCR , Stool  Once,   R        03/10/24 9075  Signed, Chapman Rota, MD [facility] 03/10/2024  "

## 2024-03-11 ENCOUNTER — Inpatient Hospital Stay (HOSPITAL_COMMUNITY): Payer: Medicare (Managed Care)

## 2024-03-11 ENCOUNTER — Other Ambulatory Visit (HOSPITAL_COMMUNITY): Payer: Self-pay

## 2024-03-11 DIAGNOSIS — A419 Sepsis, unspecified organism: Secondary | ICD-10-CM | POA: Diagnosis not present

## 2024-03-11 DIAGNOSIS — E440 Moderate protein-calorie malnutrition: Secondary | ICD-10-CM | POA: Insufficient documentation

## 2024-03-11 LAB — CBC WITH DIFFERENTIAL/PLATELET
Abs Immature Granulocytes: 0.04 K/uL (ref 0.00–0.07)
Basophils Absolute: 0.1 K/uL (ref 0.0–0.1)
Basophils Relative: 1 %
Eosinophils Absolute: 0.1 K/uL (ref 0.0–0.5)
Eosinophils Relative: 2 %
HCT: 33.2 % — ABNORMAL LOW (ref 39.0–52.0)
Hemoglobin: 10.4 g/dL — ABNORMAL LOW (ref 13.0–17.0)
Immature Granulocytes: 1 %
Lymphocytes Relative: 14 %
Lymphs Abs: 1 K/uL (ref 0.7–4.0)
MCH: 24.7 pg — ABNORMAL LOW (ref 26.0–34.0)
MCHC: 31.3 g/dL (ref 30.0–36.0)
MCV: 78.9 fL — ABNORMAL LOW (ref 80.0–100.0)
Monocytes Absolute: 0.7 K/uL (ref 0.1–1.0)
Monocytes Relative: 10 %
Neutro Abs: 5 K/uL (ref 1.7–7.7)
Neutrophils Relative %: 72 %
Platelets: 313 K/uL (ref 150–400)
RBC: 4.21 MIL/uL — ABNORMAL LOW (ref 4.22–5.81)
RDW: 17.2 % — ABNORMAL HIGH (ref 11.5–15.5)
WBC: 6.8 K/uL (ref 4.0–10.5)
nRBC: 0 % (ref 0.0–0.2)

## 2024-03-11 LAB — BASIC METABOLIC PANEL WITH GFR
Anion gap: 8 (ref 5–15)
BUN: 6 mg/dL — ABNORMAL LOW (ref 8–23)
CO2: 29 mmol/L (ref 22–32)
Calcium: 8.4 mg/dL — ABNORMAL LOW (ref 8.9–10.3)
Chloride: 99 mmol/L (ref 98–111)
Creatinine, Ser: 0.67 mg/dL (ref 0.61–1.24)
GFR, Estimated: >60 mL/min
Glucose, Bld: 180 mg/dL — ABNORMAL HIGH (ref 70–99)
Potassium: 3.2 mmol/L — ABNORMAL LOW (ref 3.5–5.1)
Sodium: 137 mmol/L (ref 135–145)

## 2024-03-11 LAB — URINE CULTURE: Culture: >=100000 — AB

## 2024-03-11 LAB — GASTROINTESTINAL PANEL BY PCR, STOOL (REPLACES STOOL CULTURE)

## 2024-03-11 LAB — GLUCOSE, CAPILLARY
Glucose-Capillary: 190 mg/dL — ABNORMAL HIGH (ref 70–99)
Glucose-Capillary: 196 mg/dL — ABNORMAL HIGH (ref 70–99)
Glucose-Capillary: 287 mg/dL — ABNORMAL HIGH (ref 70–99)

## 2024-03-11 MED ORDER — ZINC SULFATE 220 (50 ZN) MG PO CAPS
220.0000 mg | ORAL_CAPSULE | Freq: Every day | ORAL | Status: DC
  Administered 2024-03-12 – 2024-03-13 (×2): 220 mg via ORAL
  Filled 2024-03-11 (×3): qty 1

## 2024-03-11 MED ORDER — FIDAXOMICIN 200 MG PO TABS
200.0000 mg | ORAL_TABLET | Freq: Two times a day (BID) | ORAL | Status: DC
  Administered 2024-03-11 – 2024-03-13 (×4): 200 mg via ORAL
  Filled 2024-03-11 (×4): qty 1

## 2024-03-11 MED ORDER — BOOST / RESOURCE BREEZE PO LIQD CUSTOM
1.0000 | Freq: Three times a day (TID) | ORAL | Status: DC
  Administered 2024-03-12: 1 via ORAL

## 2024-03-11 MED ORDER — IOHEXOL 9 MG/ML PO SOLN: 1000.0000 mL | ORAL | Status: DC

## 2024-03-11 MED ORDER — POTASSIUM CHLORIDE CRYS ER 20 MEQ PO TBCR
40.0000 meq | EXTENDED_RELEASE_TABLET | ORAL | Status: AC
  Administered 2024-03-11 (×2): 40 meq via ORAL
  Filled 2024-03-11: qty 2

## 2024-03-11 MED ORDER — IOHEXOL 300 MG/ML  SOLN
100.0000 mL | Freq: Once | INTRAMUSCULAR | Status: AC | PRN
  Administered 2024-03-11: 100 mL via INTRAVENOUS

## 2024-03-11 MED ORDER — MELATONIN 5 MG PO TABS
5.0000 mg | ORAL_TABLET | Freq: Every day | ORAL | Status: DC
  Administered 2024-03-11 – 2024-03-12 (×2): 5 mg via ORAL
  Filled 2024-03-11 (×2): qty 1

## 2024-03-11 MED ORDER — B COMPLEX-C PO TABS
1.0000 | ORAL_TABLET | Freq: Every day | ORAL | Status: DC
  Administered 2024-03-12 – 2024-03-13 (×2): 1 via ORAL
  Filled 2024-03-11 (×3): qty 1

## 2024-03-11 MED ORDER — BANATROL TF EN LIQD
60.0000 mL | Freq: Two times a day (BID) | ENTERAL | Status: DC
  Administered 2024-03-12 – 2024-03-13 (×3): 60 mL via ORAL
  Filled 2024-03-11 (×5): qty 60

## 2024-03-11 MED ORDER — PROCHLORPERAZINE EDISYLATE 10 MG/2ML IJ SOLN
10.0000 mg | Freq: Four times a day (QID) | INTRAMUSCULAR | Status: DC | PRN
  Administered 2024-03-11: 10 mg via INTRAVENOUS
  Filled 2024-03-11: qty 2

## 2024-03-11 MED ORDER — INSULIN GLARGINE 100 UNIT/ML ~~LOC~~ SOLN
35.0000 [IU] | Freq: Every day | SUBCUTANEOUS | Status: DC
  Administered 2024-03-11: 35 [IU] via SUBCUTANEOUS
  Filled 2024-03-11 (×2): qty 0.35

## 2024-03-11 MED ORDER — IOHEXOL 9 MG/ML PO SOLN
500.0000 mL | ORAL | Status: AC
  Administered 2024-03-11: 500 mL via ORAL

## 2024-03-11 NOTE — Progress Notes (Signed)
 Initial Nutrition Assessment  DOCUMENTATION CODES:   Non-severe (moderate) malnutrition in context of acute illness/injury  INTERVENTION:   -Boost Breeze po TID, each supplement provides 250 kcal and 9 grams of protein   -Banatrol fiber supplement BID, each provides 45 kcals, 2g protein and 5g soluble fiber to aid diarrhea   -B complex w/ Vitamin C daily -220 mg Zinc  sulfate daily x 14 days  NUTRITION DIAGNOSIS:   Moderate Malnutrition related to acute illness as evidenced by mild muscle depletion, moderate fat depletion, energy intake < 75% for > or equal to 1 month.  GOAL:   Patient will meet greater than or equal to 90% of their needs  MONITOR:   PO intake, Supplement acceptance  REASON FOR ASSESSMENT:   Consult Assessment of nutrition requirement/status  ASSESSMENT:   74 y.o. male with PMH significant for DM2, HTN, HLD, PAD s/p right AKA, stroke, COPD, neuropathy, rheumatoid arthritis, chronic anemia.  Recently hospitalized in August 2025 for sepsis secondary to Significantly from UTI. 1/5, patient presented with 7 days of progressive weakness that started after 3 days of watery diarrhea which self resolved but weakness persisted.  Patient in room, leaning over table, didn't interact much during visit. Pt's sister at bedside and able to provide history.  States pt has had stomach issues for years. Is followed by GI and had several tests which come to no conclusion. Now has c.diff and wasn't on any antibiotics PTA. Pt not eating well. Having nausea today. Having CT of abdomen. No BMs today but had several yesterday which were diarrhea.  Will order clear liquid supplements as well as Banatrol fiber to help bulk stool as well as vitamins given losses from diarrhea.  Pt's sister states even when pt is feeling well he only eats breakfast most days and eats very little if anything for lunch and dinner.  Ate a good breakfast this morning but then started having stomach pain. Had  sausage and eggs. Advised to stay away from high fat foods like sausage at this time.  Pt with h/o right AKA. Is wheelchair bound. Has chronic LLE cellulitis.   Medications: Vitamin B-12, insulin , KLOR-CON , Prednisone , Zofran , Compazine   Labs reviewed: CBGs: 190-196 Low Potassium Low iron C.diff +   NUTRITION - FOCUSED PHYSICAL EXAM:  Flowsheet Row Most Recent Value  Orbital Region Severe depletion  Upper Arm Region Moderate depletion  Thoracic and Lumbar Region Unable to assess  Buccal Region Moderate depletion  Temple Region Moderate depletion  Clavicle Bone Region Unable to assess  Clavicle and Acromion Bone Region Unable to assess  Scapular Bone Region Unable to assess  Dorsal Hand Mild depletion  Patellar Region Unable to assess  Anterior Thigh Region Unable to assess  Posterior Calf Region Unable to assess  Edema (RD Assessment) None  Hair Reviewed  Eyes Reviewed  Mouth Reviewed  Skin Reviewed  Nails Reviewed    Diet Order:   Diet Order             Diet heart healthy/carb modified Room service appropriate? Yes; Fluid consistency: Thin  Diet effective now                   EDUCATION NEEDS:   Education needs have been addressed  Skin:  Skin Assessment: Reviewed RN Assessment  Last BM:  1/7 -type 5  Height:   Ht Readings from Last 1 Encounters:  01/13/24 6' (1.829 m)    Weight:   Wt Readings from Last 1 Encounters:  03/09/24  72.6 kg    BMI:  Body mass index is 21.7 kg/m.  Estimated Nutritional Needs:   Kcal:  1800-2000  Protein:  85-100g  Fluid:  2L/day   Morna Lee, MS, RD, LDN Inpatient Clinical Dietitian Contact via Secure chat

## 2024-03-11 NOTE — Progress Notes (Signed)
 " PROGRESS NOTE  Brian Zavala  DOB: Jan 27, 1951  PCP: FORBES Nola Raisin, [place] FMW:[id number]  DOA: 03/08/2024  LOS: 2 days  Hospital Day: 4  Subjective: Patient was seen and examined this morning.  Sitting up in bed. Abdomen pain denies. Diarrhea seems improving. Afebrile, hemodynamically stable, breathing room air Labs from this morning potassium low at 3.2, count normalized to 6.8, hemoglobin normal at 10.4 Blood sugar level 190 this morning  Brief narrative: Brian Zavala is a 74 y.o. male with PMH significant for DM2, HTN, HLD, PAD s/p right AKA, stroke, COPD, neuropathy, rheumatoid arthritis, chronic anemia. Recently hospitalized in August 2025 for sepsis secondary to Significantly from UTI.  1/5, patient presented with 7 days of progressive weakness that started after 3 days of watery diarrhea which self resolved but weakness persisted.  He has had couple of falls without loss of consciousness.  Appetite has been poor. There was also concern for left lower extremity cellulitis although sister reported this was chronic.  In the ED, blood pressure was low normal Met sepsis criteria with WBC count 14.2, lactic acid 3 CT chest abdomen pelvis showed  -bilateral pyelonephritis and also evidence of proctocolitis.   -Left-sided 6th and 7th rib fracture.    Resp virus panel negative Urinalysis showed hazy yellow urine with large leukocytes and many bacteria Urine drug screen positive for opiate and THC Urine culture blood culture collected Started on antibiotics Admitted to [facility]  Assessment and plan: Sepsis POA  Bilateral pyelonephritis  Presented with 7 days of progressive weakness after 3 days of self-limiting diarrhea. Met sepsis criteria with leukocytosis, lactic acidosis urinalysis suggestive of infection CT chest abdomen pelvis showed bilateral pyelonephritis and evidence of proctocolitis  Urine culture grew more than 100,000 CFU per mL Klebsiella oxytoca.  Blood culture  did not show any growth. Currently improving with IV Rocephin  and IV vancomycin . Stop IV vancomycin . WBC count improving.  Lactic acid normalized. No fever.  Continue to monitor Recent Labs  Lab 03/08/24 2136 03/08/24 2152 03/08/24 2343 03/09/24 0427 03/09/24 0708 03/10/24 0518 03/11/24 0520  WBC 14.2*  --   --  15.0*  --  10.9* 6.8  LATICACIDVEN  --  3.0* 2.3*  --  1.4  --   --    C. difficile pancolitis CT abdomen on presentation showed proctocolitis. 1/7, C diff assay showed C. difficile antigen and toxin positive.  Patient was started on oral vancomycin . Because of persistent abdominal pain and diarrhea, I ordered for repeat CT abdomen yesterday.  In process today  Acute encephalopathy Altered, weak secondary to sepsis,, diarrhea, dehydration, poor oral intake. CT head on admission unremarkable for acute findings. Lyrica  and morphine  sulfate were held on admission due to altered mentation.  Later resumed Mental status -alert, awake but slow to respond.  Type 2 diabetes mellitus A1c 7.5 on August 2025 PTA meds-Semglee  72 units a.m., metformin  1000 mg twice daily.  Also on chronic low-dose prednisone  for rheumatoid arthritis. Currently on Lantus  25 units daily.  Blood sugar level continues to remain elevated over 200.  I will increase it to 35 units for this morning.  Continue to same tactics.   Recent Labs  Lab 03/10/24 1125 03/10/24 1646 03/10/24 2145 03/11/24 0717 03/11/24 1244  GLUCAP 249* 266* 204* 190* 196*   Hypertension PTA meds- Toprol  25 mg daily, hydrochlorothiazide  12.5 g daily. Blood pressure meds were initially held due to sepsis. Currently on Toprol .  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   Continue both.  Patient stopped taking Enbrel  about 4 months ago with concern of chronic wound in the left lower extremity. Pain regimen --- Scheduled: MS Contin  60 mg twice daily, Lyrica  150 mg twice daily -continued from home --- PRN:  Continue bowel regimen with Linzess   Generalized weakness Falls  Nondisplaced rib fractures Reportedly had falls in the last 7 days due to weakness. Imaging on admission showed 6th and 7th left rib fractures.  Appears to be healing. PT eval ordered  Anemia of chronic disease Esophagitis Slightly low hemoglobin level but stable. Esophagitis seen on CT scan. Continue PPI, vitamin B12 Recent Labs    12/09/23 1416 12/23/23 0615 03/08/24 2136 03/08/24 2152 03/09/24 0427 03/10/24 0518 03/11/24 0520  HGB  --    < > 11.1* 11.6* 9.9* 10.7* 10.4*  MCV  --   --  78.5*  --  79.7* 78.0* 78.9*  VITAMINB12 411  --   --   --   --  924*  --   FOLATE  --   --   --   --   --  13.5  --   FERRITIN 81.7  --   --   --   --  186  --   TIBC 284.2  --   --   --   --  207*  --   IRON 44  --   --   --   --  16*  --   RETICCTPCT  --   --   --   --   --  1.5  --    < > = values in this interval not displayed.   Hypokalemia Potassium level low at 3.2 this morning.  Secondary to diarrheal losses.  Replacement ordered. 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  Follow-up as an outpatient for further imaging.   Nutrition Status:         Mobility: Was able to transfer himself in and out of wheelchair at home.  Seen by  PT.  PT Orders:   PT Follow up Rec: Home Health Pt1/09/2024 1252    Goals of care   Code Status: Full Code     DVT prophylaxis:  enoxaparin  (LOVENOX ) injection 40 mg Start: 03/10/24 1700   Antimicrobials: IV Rocephin , oral vancomycin  Fluid: Continue NS at 75 mL/h in the setting of ongoing diarrhea Consultants: None Family Communication: Sister at bedside  Status: Inpatient Level of care:  Med-Surg   Patient is from: Home Needs to continue in-hospital care: Continues to have diarrhea.  C. difficile positive. Pending CT scan today. Anticipated d/c to: Pending clinical course   Diet:  Diet Order             Diet heart healthy/carb modified Room service appropriate? Yes; Fluid consistency: Thin  Diet effective now                   Scheduled Meds:  aspirin   81 mg Oral q AM   atorvastatin   80 mg Oral q1800   B-complex with vitamin C  1 tablet Oral Daily   clopidogrel   75 mg Oral Daily   vitamin B-12  1,000 mcg Oral Daily   enoxaparin  (LOVENOX ) injection  40 mg Subcutaneous Q24H   feeding supplement  1 Container Oral TID BM   fiber supplement (BANATROL TF)  60 mL Oral BID   insulin  aspart  0-9 Units Subcutaneous TID WC   insulin  glargine  35 Units Subcutaneous Daily   metoprolol  succinate  25 mg Oral QPM   morphine   60 mg Oral Q12H   pantoprazole   40 mg Oral Daily   predniSONE   2 mg Oral Q breakfast   pregabalin   150 mg Oral BID   vancomycin   125 mg Oral QID   zinc  sulfate (50mg  elemental zinc )  220 mg Oral Daily    PRN meds: acetaminophen  **OR** acetaminophen , HYDROmorphone  (DILAUDID ) injection, prochlorperazine    Infusions:   sodium chloride  75 mL/hr at 03/11/24 0552   cefTRIAXone  (ROCEPHIN )  IV 2 g (03/10/24 2147)    Antimicrobials: Anti-infectives (From admission, onward)    Start     Dose/Rate Route Frequency Ordered Stop   03/10/24 1400  vancomycin  (VANCOCIN ) capsule 125 mg        125 mg Oral 4 times daily 03/10/24 1240 03/20/24 1359    03/09/24 2300  cefTRIAXone  (ROCEPHIN ) 2 g in sodium chloride  0.9 % 100 mL IVPB        2 g 200 mL/hr over 30 Minutes Intravenous Every 24 hours 03/09/24 0411     03/09/24 1100  vancomycin  (VANCOCIN ) IVPB 1000 mg/200 mL premix  Status:  Discontinued        1,000 mg 200 mL/hr over 60 Minutes Intravenous Every 12 hours 03/09/24 0421 03/11/24 0925   03/08/24 2330  cefTRIAXone  (ROCEPHIN ) 2 g in sodium chloride  0.9 % 100 mL IVPB        2 g 200 mL/hr over 30 Minutes Intravenous Once 03/08/24 2319 03/09/24 0000   03/08/24 2330  vancomycin  (VANCOCIN ) IVPB 1000 mg/200 mL premix        1,000 mg 200 mL/hr over 60 Minutes Intravenous  Once 03/08/24 2319 03/09/24 0030       Objective: Vitals:   03/10/24 2005 03/11/24 0428  BP: 134/77 135/75  Pulse: 86 86  Resp: 18 18  Temp: 98.2 F (36.8 C) 98.4 F (36.9 C)  SpO2: 95% 93%    Intake/Output Summary (Last 24 hours) at 03/11/2024 1336 Last data filed at 03/11/2024 1000 Gross per 24 hour  Intake 2303.99 ml  Output 3100 ml  Net -796.01 ml   Filed Weights   03/09/24 0913  Weight: 72.6 kg   Weight change:  Body mass index is 21.7 kg/m.   Physical Exam: General exam: Pleasant, elderly Caucasian male.  In distress from nausea, abdominal pain Skin: No rashes, lesions or ulcers.  Looks dry HEENT: Atraumatic, normocephalic, no obvious bleeding Lungs: Clear to auscultation bilaterally,  CVS: S1, S2, no murmur,   GI/Abd: Soft, epigastric tenderness persists, distended, tympanitic, bowel sound present,   CNS: Alert, awake, slow to respond Psychiatry: Looks depressed Extremities: No pedal  edema, no calf tenderness, prior right AKA status  Data Review: I have personally reviewed the laboratory data and studies available.  F/u labs ordered Unresulted Labs (From admission, onward)     Start     Ordered   03/12/24 0500  CBC with Differential/Platelet  Tomorrow morning,   R        03/11/24 1336   03/12/24 0500  Basic metabolic panel with GFR   Tomorrow morning,   R        03/11/24 1336   03/10/24 0925  Gastrointestinal Panel by PCR , Stool  Once,   R        03/10/24 0924            Signed, Chapman Rota, MD [facility] 03/11/2024  "

## 2024-03-11 NOTE — Progress Notes (Signed)
 Patient refused 1400 dose of oral Vanc and MD notified.

## 2024-03-12 LAB — CBC WITH DIFFERENTIAL/PLATELET
Abs Immature Granulocytes: 0.06 K/uL (ref 0.00–0.07)
Basophils Absolute: 0.1 K/uL (ref 0.0–0.1)
Basophils Relative: 1 %
Eosinophils Absolute: 0.1 K/uL (ref 0.0–0.5)
Eosinophils Relative: 1 %
HCT: 34.4 % — ABNORMAL LOW (ref 39.0–52.0)
Hemoglobin: 11 g/dL — ABNORMAL LOW (ref 13.0–17.0)
Immature Granulocytes: 1 %
Lymphocytes Relative: 10 %
Lymphs Abs: 0.7 K/uL (ref 0.7–4.0)
MCH: 24.8 pg — ABNORMAL LOW (ref 26.0–34.0)
MCHC: 32 g/dL (ref 30.0–36.0)
MCV: 77.5 fL — ABNORMAL LOW (ref 80.0–100.0)
Monocytes Absolute: 0.7 K/uL (ref 0.1–1.0)
Monocytes Relative: 10 %
Neutro Abs: 5.5 K/uL (ref 1.7–7.7)
Neutrophils Relative %: 77 %
Platelets: 316 K/uL (ref 150–400)
RBC: 4.44 MIL/uL (ref 4.22–5.81)
RDW: 17 % — ABNORMAL HIGH (ref 11.5–15.5)
WBC: 7.1 K/uL (ref 4.0–10.5)
nRBC: 0 % (ref 0.0–0.2)

## 2024-03-12 LAB — GLUCOSE, CAPILLARY
Glucose-Capillary: 210 mg/dL — ABNORMAL HIGH (ref 70–99)
Glucose-Capillary: 202 mg/dL — ABNORMAL HIGH (ref 70–99)
Glucose-Capillary: 274 mg/dL — ABNORMAL HIGH (ref 70–99)
Glucose-Capillary: 336 mg/dL — ABNORMAL HIGH (ref 70–99)
Glucose-Capillary: 241 mg/dL — ABNORMAL HIGH (ref 70–99)

## 2024-03-12 LAB — BASIC METABOLIC PANEL WITH GFR
Anion gap: 10 (ref 5–15)
BUN: 6 mg/dL — ABNORMAL LOW (ref 8–23)
CO2: 27 mmol/L (ref 22–32)
Calcium: 8.5 mg/dL — ABNORMAL LOW (ref 8.9–10.3)
Chloride: 100 mmol/L (ref 98–111)
Creatinine, Ser: 0.63 mg/dL (ref 0.61–1.24)
GFR, Estimated: >60 mL/min
Glucose, Bld: 194 mg/dL — ABNORMAL HIGH (ref 70–99)
Potassium: 3.4 mmol/L — ABNORMAL LOW (ref 3.5–5.1)
Sodium: 137 mmol/L (ref 135–145)

## 2024-03-12 MED ORDER — INSULIN ASPART 100 UNIT/ML IJ SOLN
0.0000 [IU] | Freq: Every day | INTRAMUSCULAR | Status: DC
  Administered 2024-03-12: 2 [IU] via SUBCUTANEOUS
  Filled 2024-03-12: qty 2

## 2024-03-12 MED ORDER — METRONIDAZOLE 500 MG PO TABS
500.0000 mg | ORAL_TABLET | Freq: Two times a day (BID) | ORAL | Status: DC
  Administered 2024-03-12 – 2024-03-13 (×3): 500 mg via ORAL
  Filled 2024-03-12 (×3): qty 1

## 2024-03-12 MED ORDER — POTASSIUM CHLORIDE CRYS ER 20 MEQ PO TBCR
40.0000 meq | EXTENDED_RELEASE_TABLET | ORAL | Status: AC
  Administered 2024-03-12: 40 meq via ORAL
  Filled 2024-03-12: qty 2

## 2024-03-12 MED ORDER — INSULIN GLARGINE 100 UNIT/ML ~~LOC~~ SOLN
45.0000 [IU] | Freq: Every day | SUBCUTANEOUS | Status: DC
  Administered 2024-03-12: 45 [IU] via SUBCUTANEOUS
  Filled 2024-03-12 (×2): qty 0.45

## 2024-03-12 MED ORDER — INSULIN ASPART 100 UNIT/ML IJ SOLN
0.0000 [IU] | Freq: Three times a day (TID) | INTRAMUSCULAR | Status: DC
  Administered 2024-03-12: 11 [IU] via SUBCUTANEOUS
  Administered 2024-03-12: 8 [IU] via SUBCUTANEOUS
  Administered 2024-03-13: 3 [IU] via SUBCUTANEOUS
  Administered 2024-03-13: 5 [IU] via SUBCUTANEOUS
  Filled 2024-03-12: qty 3
  Filled 2024-03-12: qty 8

## 2024-03-12 NOTE — Progress Notes (Signed)
 " PROGRESS NOTE  Brian Zavala  DOB: [birth year]  PCP: FORBES Nola Raisin, [place] FMW:[id number]  DOA: 03/08/2024  LOS: 3 days  Hospital Day: 5  Subjective: Patient was seen and examined this morning. Lying on bed.  Not in distress.  Abdominal pain much improved.  Diarrhea continues but improving.  Family not at bedside. Overall patient feels much better than yesterday. Afebrile, hemodynamically stable, breathing on room air Labs from this morning with potassium low at 3.4, renal function normal, WC count normal   Brief narrative: Brian Zavala is a 74 y.o. male with PMH significant for DM2, HTN, HLD, PAD s/p right AKA, stroke, COPD, neuropathy, rheumatoid arthritis, chronic anemia. Recently hospitalized in August 2025 for sepsis secondary to Significantly from UTI.  1/5, patient presented with 7 days of progressive weakness that started after 3 days of watery diarrhea which self resolved but weakness persisted.  He has had couple of falls without loss of consciousness.  Appetite has been poor. There was also concern for left lower extremity cellulitis although sister reported this was chronic.  In the ED, blood pressure was low normal Met sepsis criteria with WBC count 14.2, lactic acid 3 CT chest abdomen pelvis showed  -bilateral pyelonephritis and also evidence of proctocolitis.   -Left-sided 6th and 7th rib fracture.    Resp virus panel negative Urinalysis showed hazy yellow urine with large leukocytes and many bacteria Urine drug screen positive for opiate and THC Urine culture blood culture collected Started on antibiotics Admitted to [facility]  Assessment and plan: Sepsis POA  Bilateral pyelonephritis  Presented with 7 days of progressive weakness after 3 days of self-limiting diarrhea. Met sepsis criteria with leukocytosis, lactic acidosis urinalysis suggestive of infection CT chest abdomen pelvis showed bilateral pyelonephritis and evidence of proctocolitis  Urine culture  grew more than 100,000 CFU per mL Klebsiella oxytoca.  Blood culture did not show any growth. Currently improving with IV Rocephin  and IV vancomycin . Stop IV vancomycin . WBC count improving.  Lactic acid normalized. No fever.  Repeat CT abdomen 1/8 also showed a tiny region of hypoattenuation in the left lower pole worrisome for developing abscess.  However, patient has been clinically improving and I doubt there is any underlying abscess.  Will add oral Flagyl  Recent Labs  Lab 03/08/24 2136 03/08/24 2152 03/08/24 2343 03/09/24 0427 03/09/24 0708 03/10/24 0518 03/11/24 0520 03/12/24 0530  WBC 14.2*  --   --  15.0*  --  10.9* 6.8 7.1  LATICACIDVEN  --  3.0* 2.3*  --  1.4  --   --   --    C. difficile pancolitis CT abdomen on presentation showed proctocolitis. 1/7, C diff assay showed C. difficile antigen and toxin positive.  Patient was started on oral vancomycin . Because of persistent abdominal pain and diarrhea, I repeat CT abdomen was obtained on 1/8.  It showed moderate wall thickening of the cecum and proximal ascending colon compatible with infectious colitis, no findings to suggest toxic megacolon. 1/8, switched from oral vancomycin  to Dificid  on patient's request because he believed that oral vancomycin  was flaring up his abdominal symptoms 1/9, diarrhea persists but abdominal pain improving.  Acute encephalopathy Altered, weak secondary to sepsis,, diarrhea, dehydration, poor oral intake. CT head on admission unremarkable for acute findings. Lyrica  and morphine  sulfate were held on admission due to altered mentation.  Later resumed Mental status -alert, awake but slow to respond.  Type 2 diabetes mellitus A1c 7.5 on August 2025 PTA meds-Semglee  72 units a.m.,  metformin  1000 mg twice daily.  Also on chronic low-dose prednisone  for rheumatoid arthritis. Currently on Lantus  35 units daily.  Blood sugar level continues to remain elevated over 200.  Increase Lantus  to 45 units  this morning. Continue SSI with Accu-Cheks  Recent Labs  Lab 03/11/24 1244 03/11/24 1627 03/11/24 2131 03/12/24 0753 03/12/24 1213  GLUCAP 196* 287* 210* 202* 274*   Hypertension PTA meds- Toprol  25 mg daily, hydrochlorothiazide  12.5 g daily. Blood pressure meds were initially held due to sepsis. Currently on Toprol .  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   Continue both.  Patient stopped taking Enbrel  about 4 months ago with concern of chronic wound in the left lower extremity. Pain regimen --- Scheduled: MS Contin  60 mg twice daily, Lyrica  150 mg twice daily -continued from home --- PRN:  Continue bowel regimen with Linzess   Generalized weakness Falls  Nondisplaced rib fractures Reportedly had falls in the last 7 days due to weakness. Imaging on admission showed 6th and 7th left rib fractures.  Appears to be healing. PT eval ordered  Anemia of chronic disease Esophagitis Slightly low hemoglobin level but stable. Esophagitis seen on CT scan. Continue PPI, vitamin B12 Recent Labs    12/09/23 1416 12/23/23 0615 03/08/24 2152 03/09/24 0427 03/10/24 0518 03/11/24 0520 03/12/24 0530  HGB  --    < > 11.6* 9.9* 10.7* 10.4* 11.0*  MCV  --    < >  --  79.7* 78.0* 78.9* 77.5*  VITAMINB12 411  --   --   --  924*  --   --   FOLATE  --   --   --   --  13.5  --   --   FERRITIN 81.7  --   --   --  186  --   --   TIBC 284.2  --   --   --  207*  --   --   IRON 44  --   --   --  16*  --   --   RETICCTPCT  --   --   --   --  1.5  --   --    < > = values in this interval not displayed.   Hypokalemia Potassium  level low at 3.4 this morning.  Secondary to diarrheal losses.  Replacement ordered. 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  Follow-up as an outpatient for further imaging.   Nutrition Status: Nutrition Problem: Moderate Malnutrition Etiology: acute illness Signs/Symptoms: mild muscle depletion, moderate fat depletion, energy intake <  75% for > or equal to 1 month Interventions: Boost Breeze, MVI, Refer to RD note for recommendations   Mobility: Was able to transfer himself in and out of wheelchair at home.  Seen by PT.  PT Orders:   PT Follow up Rec: Home Health Pt1/09/2024 1252    Goals of care   Code Status: Full Code     DVT prophylaxis:  enoxaparin  (LOVENOX ) injection 40 mg Start: 03/10/24 1700   Antimicrobials: IV Rocephin , oral vancomycin  Fluid: Continue NS at 75 mL/h in the setting of ongoing diarrhea.  I will continue the same for today Consultants: None Family Communication: Sister at bedside  Status: Inpatient Level of care:  Med-Surg   Patient is from: Home Needs to continue in-hospital care: Continues to have diarrhea.  C. difficile positive.  Anticipated d/c to: Pending clinical course   Diet:  Diet Order             Diet heart healthy/carb modified Room service appropriate? Yes; Fluid consistency: Thin  Diet effective now                   Scheduled Meds:  aspirin   81 mg Oral q AM   atorvastatin   80 mg Oral q1800   B-complex with vitamin C  1 tablet Oral Daily   clopidogrel   75 mg Oral Daily   vitamin B-12  1,000 mcg Oral Daily   enoxaparin  (LOVENOX )  injection  40 mg Subcutaneous Q24H   feeding supplement  1 Container Oral TID BM   fiber supplement (BANATROL TF)  60 mL Oral BID   fidaxomicin   200 mg Oral BID   insulin  aspart  0-15 Units Subcutaneous TID WC   insulin  aspart  0-5 Units Subcutaneous QHS   insulin  glargine  45 Units Subcutaneous Daily   melatonin  5 mg Oral QHS   metoprolol  succinate  25 mg Oral QPM   metroNIDAZOLE   500 mg Oral Q12H   morphine   60 mg Oral Q12H   pantoprazole   40 mg Oral Daily   predniSONE   2 mg Oral Q breakfast   pregabalin   150 mg Oral BID   zinc  sulfate (50mg  elemental zinc )  220 mg Oral Daily    PRN meds: acetaminophen  **OR** acetaminophen , HYDROmorphone  (DILAUDID ) injection, prochlorperazine    Infusions:   sodium chloride  75 mL/hr at 03/11/24 1442   cefTRIAXone  (ROCEPHIN )  IV 2 g (03/11/24 2310)    Antimicrobials: Anti-infectives (From admission, onward)    Start     Dose/Rate Route Frequency Ordered Stop   03/12/24 1200  metroNIDAZOLE  (FLAGYL ) tablet 500 mg        500 mg Oral Every 12 hours 03/12/24 1114     03/11/24 2200  fidaxomicin  (DIFICID ) tablet 200 mg        200 mg Oral 2 times daily 03/11/24 1725 03/21/24 2159   03/10/24 1400  vancomycin  (VANCOCIN ) capsule 125 mg  Status:  Discontinued        125 mg Oral 4 times daily 03/10/24 1240 03/11/24 1725   03/09/24 2300  cefTRIAXone  (ROCEPHIN ) 2 g in sodium chloride  0.9 % 100 mL IVPB        2 g 200 mL/hr over 30 Minutes Intravenous Every 24 hours 03/09/24 0411     03/09/24 1100  vancomycin  (VANCOCIN ) IVPB 1000 mg/200 mL premix  Status:  Discontinued        1,000 mg 200 mL/hr over 60 Minutes Intravenous Every 12 hours 03/09/24 0421 03/11/24 0925   03/08/24  2330  cefTRIAXone  (ROCEPHIN ) 2 g in sodium chloride  0.9 % 100 mL IVPB        2 g 200 mL/hr over 30 Minutes Intravenous Once 03/08/24 2319 03/09/24 0000   03/08/24 2330  vancomycin  (VANCOCIN ) IVPB 1000 mg/200 mL premix        1,000 mg 200 mL/hr over 60 Minutes Intravenous  Once  03/08/24 2319 03/09/24 0030       Objective: Vitals:   03/12/24 0940 03/12/24 1219  BP: 116/84 126/76  Pulse: (!) 103 93  Resp: 16 14  Temp: 98.1 F (36.7 C) 98 F (36.7 C)  SpO2: 95% 94%    Intake/Output Summary (Last 24 hours) at 03/12/2024 1317 Last data filed at 03/12/2024 1237 Gross per 24 hour  Intake 2995 ml  Output 3400 ml  Net -405 ml   Filed Weights   03/09/24 0913  Weight: 72.6 kg   Weight change:  Body mass index is 22.96 kg/m.   Physical Exam: General exam: Pleasant, elderly Caucasian male.  In distress from nausea, abdominal pain. Skin: No rashes, lesions or ulcers.  Looks dry HEENT: Atraumatic, normocephalic, no obvious bleeding Lungs: Clear to auscultation bilaterally,  CVS: S1, S2, no murmur,   GI/Abd: Soft, epigastric tenderness improving.  She, distended, tympanitic, bowel sound present,   CNS: Alert, awake, slow to respond Psychiatry: Looks depressed Extremities: No pedal edema, no calf tenderness, prior right AKA status  Data Review: I have personally reviewed the laboratory data and studies available.  F/u labs ordered Unresulted Labs (From admission, onward)     Start     Ordered   03/13/24 0500  Basic metabolic panel with GFR  Tomorrow morning,   R        03/12/24 1316   03/13/24 0500  CBC with Differential/Platelet  Tomorrow morning,   R        03/12/24 1316            Signed, Chapman Rota, MD [facility] 03/12/2024  "

## 2024-03-12 NOTE — Plan of Care (Signed)
   Problem: Fluid Volume: Goal: Ability to maintain a balanced intake and output will improve Outcome: Progressing

## 2024-03-12 NOTE — Progress Notes (Signed)
 Physical Therapy Treatment Patient Details Name: Brian Zavala MRN: [id number] DOB: 04-24-1950 Today's Date: 03/12/2024   History of Present Illness 74 yo brought to [facility] 03/09/24 after several falls, CT chest abdomen pelvis shows features concerning for bilateral pyelonephritis and also evidence of proctocolitis.  Left-sided 6th and 7th rib fracture.  Pulmonary nodules,generalized weakness, diarrhea, LLE erythema,fever.PMHx: COPD, alcohol usage, neuropathy, type 2 diabetes, L toe amputation, R AKA, HLD, HTN, arthritis, current smoker, noncompliant BiPAP.    PT Comments   Pt admitted with above diagnosis.  Pt currently with functional limitations due to the deficits listed below (see PT Problem List). Pt seated on bed when PT arrived. Pt agreeable to therapy intervention. Pt reports he is feeling better and motivated to d/c home. Pt is mod I for bed mobility tasks, set up and S for bed <> recliner scoot transfers. Pt states scoot transfers at baseline in home to a variety of surfaces, limits use of R LE prosthetic due to pain and relies on motorized wc. Pt elected to return to bed and sit EOB with all needs in place.  Pt will benefit from acute skilled PT to increase their independence and safety with mobility to allow discharge.      If plan is discharge home, recommend the following: Assistance with cooking/housework;Assist for transportation   Can travel by private vehicle        Equipment Recommendations  None recommended by PT    Recommendations for Other Services       Precautions / Restrictions Precautions Precautions: Fall Precaution/Restrictions Comments: RAKA Restrictions Weight Bearing Restrictions Per Provider Order: No     Mobility  Bed Mobility Overal bed mobility: Independent                  Transfers Overall transfer level: Needs assistance Equipment used: None Transfers: Bed to chair/wheelchair/BSC            Lateral/Scoot Transfers:  Supervision General transfer comment: set up and S with min cues bed <> recliner with drop arm recliner, pt declined to sit in recliner and elected to sit EOB    Ambulation/Gait               General Gait Details: NA, non ambulatory at baseline   Stairs             Wheelchair Mobility     Tilt Bed    Modified Rankin (Stroke Patients Only)       Balance Overall balance assessment: History of Falls, Needs assistance Sitting-balance support:  (L LE) Sitting balance-Leahy Scale: Normal                                      Communication Communication Communication: No apparent difficulties  Cognition Arousal: Alert Behavior During Therapy: WFL for tasks assessed/performed   PT - Cognitive impairments: No apparent impairments                         Following commands: Intact      Cueing    Exercises      General Comments        Pertinent Vitals/Pain Pain Assessment Pain Assessment: No/denies pain    Home Living                          Prior Function  PT Goals (current goals can now be found in the care plan section) Acute Rehab PT Goals Patient Stated Goal: return home PT Goal Formulation: With patient Time For Goal Achievement: 03/24/24 Potential to Achieve Goals: Good Progress towards PT goals: Progressing toward goals    Frequency    Min 3X/week      PT Plan      Co-evaluation              AM-PAC PT 6 Clicks Mobility   Outcome Measure  Help needed turning from your back to your side while in a flat bed without using bedrails?: None Help needed moving from lying on your back to sitting on the side of a flat bed without using bedrails?: None Help needed moving to and from a bed to a chair (including a wheelchair)?: A Little Help needed standing up from a chair using your arms (e.g., wheelchair or bedside chair)?: A Little Help needed to walk in hospital room?:  Total Help needed climbing 3-5 steps with a railing? : Total 6 Click Score: 16    End of Session   Activity Tolerance: Patient tolerated treatment well;No increased pain Patient left: with call bell/phone within reach;in bed Nurse Communication: Mobility status PT Visit Diagnosis: Unsteadiness on feet (R26.81);Other abnormalities of gait and mobility (R26.89);Repeated falls (R29.6);History of falling (Z91.81);Pain     Time: 8345-8292 PT Time Calculation (min) (ACUTE ONLY): 13 min  Charges:    $Therapeutic Activity: 8-22 mins PT General Charges $$ ACUTE PT VISIT: 1 Visit                     [place], PT Acute Rehab    [place] VEAR Drone 03/12/2024, 5:17 PM

## 2024-03-13 ENCOUNTER — Other Ambulatory Visit (HOSPITAL_COMMUNITY): Payer: Self-pay

## 2024-03-13 LAB — CBC WITH DIFFERENTIAL/PLATELET
Abs Immature Granulocytes: 0.05 K/uL (ref 0.00–0.07)
Basophils Absolute: 0.1 K/uL (ref 0.0–0.1)
Basophils Relative: 1 %
Eosinophils Absolute: 0.2 K/uL (ref 0.0–0.5)
Eosinophils Relative: 3 %
HCT: 36.9 % — ABNORMAL LOW (ref 39.0–52.0)
Hemoglobin: 11.4 g/dL — ABNORMAL LOW (ref 13.0–17.0)
Immature Granulocytes: 1 %
Lymphocytes Relative: 15 %
Lymphs Abs: 0.8 K/uL (ref 0.7–4.0)
MCH: 24.4 pg — ABNORMAL LOW (ref 26.0–34.0)
MCHC: 30.9 g/dL (ref 30.0–36.0)
MCV: 78.8 fL — ABNORMAL LOW (ref 80.0–100.0)
Monocytes Absolute: 0.6 K/uL (ref 0.1–1.0)
Monocytes Relative: 11 %
Neutro Abs: 3.9 K/uL (ref 1.7–7.7)
Neutrophils Relative %: 69 %
Platelets: 321 K/uL (ref 150–400)
RBC: 4.68 MIL/uL (ref 4.22–5.81)
RDW: 17.2 % — ABNORMAL HIGH (ref 11.5–15.5)
WBC: 5.5 K/uL (ref 4.0–10.5)
nRBC: 0 % (ref 0.0–0.2)

## 2024-03-13 LAB — BASIC METABOLIC PANEL WITH GFR
Anion gap: 9 (ref 5–15)
BUN: 7 mg/dL — ABNORMAL LOW (ref 8–23)
CO2: 27 mmol/L (ref 22–32)
Calcium: 8.7 mg/dL — ABNORMAL LOW (ref 8.9–10.3)
Chloride: 102 mmol/L (ref 98–111)
Creatinine, Ser: 0.69 mg/dL (ref 0.61–1.24)
GFR, Estimated: >60 mL/min
Glucose, Bld: 198 mg/dL — ABNORMAL HIGH (ref 70–99)
Potassium: 3.9 mmol/L (ref 3.5–5.1)
Sodium: 138 mmol/L (ref 135–145)

## 2024-03-13 LAB — GLUCOSE, CAPILLARY
Glucose-Capillary: 196 mg/dL — ABNORMAL HIGH (ref 70–99)
Glucose-Capillary: 206 mg/dL — ABNORMAL HIGH (ref 70–99)

## 2024-03-13 MED ORDER — INSULIN GLARGINE 100 UNIT/ML ~~LOC~~ SOLN
55.0000 [IU] | Freq: Every day | SUBCUTANEOUS | Status: DC
  Administered 2024-03-13: 55 [IU] via SUBCUTANEOUS
  Filled 2024-03-13: qty 0.55

## 2024-03-13 MED ORDER — BANATROL TF EN LIQD
60.0000 mL | Freq: Two times a day (BID) | ENTERAL | 0 refills | Status: AC
  Filled 2024-03-13: qty 840, 7d supply, fill #0

## 2024-03-13 MED ORDER — CEFDINIR 300 MG PO CAPS
300.0000 mg | ORAL_CAPSULE | Freq: Two times a day (BID) | ORAL | 0 refills | Status: AC
  Filled 2024-03-13: qty 10, 5d supply, fill #0

## 2024-03-13 MED ORDER — FIDAXOMICIN 200 MG PO TABS
200.0000 mg | ORAL_TABLET | Freq: Two times a day (BID) | ORAL | 0 refills | Status: AC
  Filled 2024-03-13 (×2): qty 16, 8d supply, fill #0

## 2024-03-13 NOTE — Discharge Summary (Addendum)
 "  Physician Discharge Summary  Brian Zavala FMW:[id number] DOB: 28-Aug-1950 DOA: 03/08/2024  PCP: FORBES Brian Raisin, DO  Admit date: 03/08/2024 Discharge date: 03/13/2024  Admitted from: Home Discharge disposition: Home with home health  Recommendations at discharge:  Completed course of antibiotics with 8 more days of oral Dificid  and 5 more days of oral Omnicef . Minimize the use of opioids. Follow-up with PCP as an outpatient for pulmonary nodules.  May need repeat imaging in next several months.  Subjective: Patient was seen and examined this morning.  Lying down on bed.  Not in distress.  Abdominal pain much better.  Reports 5-6 episodes of low-volume bowel movement in the last 24 hours.  Wants to go home. Afebrile, hemodynamically stable, breathing on room air Blood sugar level remains elevated over 200 Labs this morning with no significant change compared to yesterday. I called and updated his sister Brian Zavala.  She lives next-door and takes care of him.  She feels confident that she can continue to take care of him at home.  Both are hoping for discharge today.  Brief narrative: Brian Zavala is a 74 y.o. male with PMH significant for DM2, HTN, HLD, PAD s/p right AKA, stroke, COPD, neuropathy, rheumatoid arthritis, chronic anemia. Recently hospitalized in August 2025 for sepsis secondary to Significantly from UTI.  1/5, patient presented with 7 days of progressive weakness that started after 3 days of watery diarrhea which self resolved but weakness persisted.  He has had couple of falls without loss of consciousness.  Appetite has been poor. There was also concern for left lower extremity cellulitis although sister reported this was chronic.  In the ED, blood pressure was low normal Met sepsis criteria with WBC count 14.2, lactic acid 3 CT chest abdomen pelvis showed bilateral pyelonephritis and also evidence of proctocolitis.   Urinalysis showed hazy yellow urine with large  leukocytes and many bacteria Urine culture blood culture collected Started on antibiotics Admitted to [facility] His hospital course was also complicated by C. difficile diarrhea See below for details.   Hospital course: Sepsis POA  Bilateral pyelonephritis  Presented with 7 days of progressive weakness after 3 days of self-limiting diarrhea. Met sepsis criteria with leukocytosis, lactic acidosis urinalysis suggestive of infection CT chest abdomen pelvis showed bilateral pyelonephritis and evidence of proctocolitis  Urine culture grew more than 100,000 CFU per mL Klebsiella oxytoca.  Blood culture did not show any growth. Repeat CT abdomen 1/8 also showed a tiny region of hypoattenuation in the left lower pole worrisome for developing abscess.  However, patient has been clinically improving and I doubt there is any underlying abscess. Currently improving on IV Rocephin  and IV Flagyl  WBC count normalized.  No fever.  Lactic acid normalized. Feels ready to go home today.  I would complete the course of antibiotics with oral Omnicef  for next 5 days. Recent Labs  Lab 03/08/24 2152 03/08/24 2343 03/09/24 0427 03/09/24 0708 03/10/24 0518 03/11/24 0520 03/12/24 0530 03/13/24 0507  WBC  --   --  15.0*  --  10.9* 6.8 7.1 5.5  LATICACIDVEN 3.0* 2.3*  --  1.4  --   --   --   --    C. difficile pancolitis CT abdomen on presentation showed proctocolitis. 1/7, C diff assay showed C. difficile antigen and toxin positive.  Patient was started on oral vancomycin . Because of persistent abdominal pain and diarrhea, I repeat CT abdomen was obtained on 1/8.  It showed moderate wall thickening of the cecum  and proximal ascending colon compatible with infectious colitis, no findings to suggest toxic megacolon. 1/8, switched from oral vancomycin  to Dificid  on patient's request because he believed that oral vancomycin  was flaring up his abdominal symptoms In the last 48 hours since Dificid  was initiated,  patient has had significant improvement in his abdominal pain.  Diarrhea has improved as well.  Reports 5-6 episodes of low-volume stool in the last 24 hours.  Patient and his sister are confident to continue care at home.  Recommend to continue Dificid  at home to complete a 10-day course.  Acute encephalopathy Patient was altered, weak secondary to sepsis,, diarrhea, dehydration, poor oral intake. CT head on admission unremarkable for acute findings. Lyrica  and morphine  sulfate were held on admission due to altered mentation.  Later resumed Mental status intact.  Type 2 diabetes mellitus A1c 7.5 on August 2025 PTA meds-Semglee  72 units a.m., metformin  1000 mg twice daily.  Also on chronic low-dose prednisone  for rheumatoid arthritis. In the hospital, lower total daily dose of insulin  was used because of illness and poor appetite.   Can continue previous regimen at home.    Hypertension PTA meds- Toprol  25 mg daily, hydrochlorothiazide  12.5 g daily. Blood pressure meds were initially held due to sepsis. Blood pressure improved.  Continue Toprol  and HCTZ at home as before.  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   Continue both.  Patient stopped taking Enbrel  about 4 months ago with concern of chronic wound in the left lower extremity. Pain regimen --- Scheduled: MS Contin  60 mg twice daily, Lyrica  150 mg twice daily -continued from home --- PRN:  Continue bowel regimen with Linzess   Generalized weakness Falls  Nondisplaced rib fractures Reportedly had falls in the last 7 days due to weakness. Imaging on admission showed 6th and 7th left rib fractures.  Appears to be healing. PT eval obtained.  Home with PT  recommended  Anemia of chronic disease Esophagitis Slightly low hemoglobin level but stable. Esophagitis seen on CT scan. Continue PPI, vitamin B12 Recent Labs    12/09/23 1416 12/23/23 0615 03/09/24 0427 03/10/24 0518 03/11/24 0520 03/12/24 0530 03/13/24 0507  HGB  --    < > 9.9* 10.7* 10.4* 11.0* 11.4*  MCV  --    < > 79.7* 78.0* 78.9* 77.5* 78.8*  VITAMINB12 411  --   --  924*  --   --   --   FOLATE  --   --   --  13.5  --   --   --   FERRITIN 81.7  --   --  186  --   --   --   TIBC 284.2  --   --  207*  --   --   --   IRON 44  --   --  16*  --   --   --   RETICCTPCT  --   --   --  1.5  --   --   --    < > = values in this interval not displayed.   Hypokalemia Potassium level low at 3.4 this morning.  Secondary to diarrheal losses.  Replacement ordered. Recent Labs  Lab 03/08/24 2136 03/08/24 2152 03/09/24 9572 03/10/24 0518 03/11/24 0520 03/12/24 0530 03/13/24 0507  NA 133*   < > 136 137 137 137 138  K 4.2   < > 3.1* 3.5 3.2* 3.4* 3.9  CL 95*   < > 101 101 99 100 102  CO2 26  --  27 27 29 27 27   GLUCOSE 85   < > 62* 195* 180* 194* 198*  BUN 12   < > 11 9 6* 6* 7*  CREATININE 0.92   < > 0.72 0.62 0.67 0.63 0.69  CALCIUM  8.7*  --  8.1* 8.5* 8.4* 8.5* 8.7*  MG 1.7  --   --   --   --   --   --    < > = values in this interval not displayed.    Pulm nodules Seen in CT scan.   Follow-up as an outpatient for further imaging.   Nutrition Status: Nutrition Problem: Moderate Malnutrition Etiology: acute illness Signs/Symptoms: mild muscle depletion, moderate fat depletion, energy intake < 75% for > or equal to 1 month Interventions: Boost Breeze, MVI, Refer to RD note for recommendations   Goals of care   Code Status: Full Code   Diet:  Diet Order             Diet general           Diet heart healthy/carb modified Room service appropriate? Yes; Fluid consistency: Thin  Diet effective now                   Nutritional status:  Body mass index is  22.96 kg/m.  Nutrition Problem: Moderate Malnutrition Etiology: acute illness Signs/Symptoms: mild muscle depletion, moderate fat depletion, energy intake < 75% for > or equal to 1 month  Wounds:  -    Discharge Medications:   Allergies as of 03/13/2024       Reactions   Chantix [varenicline] Nausea Only        Medication List     PAUSE taking these medications    Linzess  290 MCG Caps capsule Wait to take this until your doctor or other care provider tells you to start again. Generic drug: linaclotide  Take 290 mcg by mouth daily before breakfast.       TAKE these medications    aspirin  81 MG chewable tablet Chew 81 mg by mouth in the morning.   atorvastatin  80 MG tablet Commonly known as: LIPITOR  Take 1 tablet (80 mg total) by mouth daily at 6 PM.   calcium  carbonate 1500 (600 Ca) MG Tabs tablet Commonly known as: OSCAL Take 600 mg of elemental calcium  by mouth every evening.   cefdinir  300 MG capsule Commonly known as: OMNICEF  Take 1 capsule (300 mg total) by mouth 2 (two) times daily for 5 days.   clopidogrel  75 MG tablet Commonly known as: PLAVIX  Take 1 tablet (75 mg total) by mouth daily.   Enbrel  50 MG/ML injection Generic drug: etanercept  Inject 50 mg into the skin every Sunday.   fiber supplement (BANATROL TF) liquid Take 60 mLs by mouth 2 (two) times daily for 7 days.   fidaxomicin  200 MG Tabs tablet Commonly known as: DIFICID  Take 1 tablet (200 mg total) by mouth 2 (two) times daily for 8 days.   hydrochlorothiazide  12.5 MG capsule Commonly known as: MICROZIDE  Take 12.5 mg by mouth daily.   insulin  aspart 100 UNIT/ML injection Commonly known as: novoLOG  Inject 2-12 Units into the skin in the morning and at bedtime. sliding scale: 131-180   2 units 181-240   4 units  241-300   6 units 301-350   8 units 351-400   10 units >400        12 units   insulin  glargine 100 UNIT/ML injection Commonly known as: Semglee  Inject 0.7 mLs (70  Units total) into the skin daily. What changed: how much to take   leflunomide  20 MG tablet Commonly known as: ARAVA  Take 20 mg by mouth daily.   metFORMIN  500 MG 24 hr tablet Commonly known as: GLUCOPHAGE -XR Take 1,000 mg by mouth 2 (two) times daily with a meal.   metoprolol  succinate 25 MG 24 hr tablet Commonly known as: TOPROL -XL Take 25 mg by mouth every evening.   morphine  80 MG 24 hr capsule Commonly known as: KADIAN  Take 80 mg by mouth in the morning and at bedtime.   oxymetazoline  0.05 % nasal spray Commonly known as: AFRIN Place 1 spray into both nostrils 2 (two) times daily as needed for congestion.   pantoprazole  40 MG tablet Commonly known as: PROTONIX  Take 1 tablet (40 mg total) by mouth daily before breakfast. What changed: when to take this   predniSONE  1 MG tablet Commonly known as: DELTASONE  Take 2 mg by mouth daily with breakfast.   pregabalin  150 MG capsule Commonly known as: LYRICA  Take 150 mg by mouth 2 (two) times daily.   promethazine  25 MG tablet Commonly known as: PHENERGAN  Take 25 mg by mouth daily as needed for nausea or vomiting.   VITAMIN B-12 PO Take 1 tablet by mouth every evening.         Follow ups:    Follow-up Information     E, Brian Raisin, DO Follow up.   Specialty: Student Contact information: [address] [place] [address] [phone]                 Discharge Instructions:   Discharge Instructions     Call MD for:  difficulty breathing, headache or visual disturbances   Complete by: As directed    Call MD for:  extreme fatigue   Complete by: As directed    Call MD for:  hives   Complete by: As directed    Call MD for:  persistant dizziness or light-headedness   Complete by: As directed    Call MD for:  persistant nausea and vomiting   Complete by: As directed    Call MD for:  severe uncontrolled pain   Complete by: As directed    Call MD for:  temperature >100.4   Complete by: As  directed    Diet general   Complete by: As directed    Discharge instructions   Complete by: As directed    Recommendations at discharge:   Completed course of antibiotics with 8 more days of oral Dificid  and 5 more days of oral Omnicef .  Minimize the use of opioids.  Follow-up with PCP as an outpatient for pulmonary nodules.  May need repeat imaging in next several months  PDMP reviewed this encounter.   Opioid taper instructions: It is important to wean off of your opioid medication as soon as possible. If you do not need pain medication after your surgery it is ok to stop day one. Opioids include: Codeine, Hydrocodone (Norco, Vicodin), Oxycodone (Percocet, oxycontin ) and hydromorphone  amongst others.  Long term and even short term use of opiods can cause: Increased pain response Dependence Constipation Depression Respiratory depression And more.  Withdrawal symptoms can include Flu like symptoms Nausea, vomiting And more Techniques to manage these symptoms Hydrate  well Eat regular healthy meals Stay active Use relaxation techniques(deep breathing, meditating, yoga) Do Not substitute Alcohol to help with tapering If you have been on opioids for less than two weeks and do not have pain than it is ok to stop all together.  Plan to wean off of opioids This plan should start within one week post op of your joint replacement. Maintain the same interval or time between taking each dose and first decrease the dose.  Cut the total daily intake of opioids by one tablet each day Next start to increase the time between doses. The last dose that should be eliminated is the evening dose.        General discharge instructions: Follow with Primary MD E, Brian Raisin, DO in 7 days  Please request your PCP  to go over your hospital tests, procedures, radiology results at the follow up. Please get your medicines reviewed and adjusted.  Your PCP may decide to repeat certain labs or  tests as needed. Do not drive, operate heavy machinery, perform activities at heights, swimming or participation in water activities or provide baby sitting services if your were admitted for syncope or siezures until you have seen by Primary MD or a Neurologist and advised to do so again. [place]  Controlled Substance Reporting System database was reviewed. Do not drive, operate heavy machinery, perform activities at heights, swim, participate in water activities or provide baby-sitting services while on medications for pain, sleep and mood until your outpatient physician has reevaluated you and advised to do so again.  You are strongly recommended to comply with the dose, frequency and duration of prescribed medications. Activity: As tolerated with Full fall precautions use walker/cane & assistance as needed Avoid using any recreational substances like cigarette, tobacco, alcohol, or non-prescribed drug. If you experience worsening of your admission symptoms, develop shortness of breath, life threatening emergency, suicidal or homicidal thoughts you must seek medical attention immediately by calling 911 or calling your MD immediately  if symptoms less severe. You must read complete instructions/literature along with all the possible adverse reactions/side effects for all the medicines you take and that have been prescribed to you. Take any new medicine only after you have completely understood and accepted all the possible adverse reactions/side effects.  Wear Seat belts while driving. You were cared for by a hospitalist during your hospital stay. If you have any questions about your discharge medications or the care you received while you were in the hospital after you are discharged, you can call the unit and ask to speak with the hospitalist or the covering physician. Once you are discharged, your primary care physician will handle any further medical issues. Please note that NO REFILLS for any  discharge medications will be authorized once you are discharged, as it is imperative that you return to your primary care physician (or establish a relationship with a primary care physician if you do not have one).   Increase activity slowly   Complete by: As directed        Discharge Exam:   Vitals:   03/12/24 0940 03/12/24 1219 03/12/24 1943 03/13/24 0658  BP: 116/84 126/76 (!) 139/94 125/76  Pulse: (!) 103 93 95 95  Resp: 16 14 18 16   Temp: 98.1 F (36.7 C) 98 F (36.7 C) 98.3 F (36.8 C) 98.1 F (36.7 C)  TempSrc: Oral Oral Oral Oral  SpO2: 95% 94% 95% 95%  Weight:      Height:  Body mass index is 22.96 kg/m.   General exam: Pleasant, elderly Caucasian male.  Not in distress Skin: No rashes, lesions or ulcers.   HEENT: Atraumatic, normocephalic, no obvious bleeding Lungs: Clear to auscultation bilaterally,  CVS: S1, S2, no murmur,   GI/Abd: Soft, epigastric tenderness much better, tympanitic, bowel sound present,   CNS: Alert, awake, oriented x 3 Psychiatry: Mood appropriate Extremities: No pedal edema, no calf tenderness, prior right AKA status   The results of significant diagnostics from this hospitalization (including imaging, microbiology, ancillary and laboratory) are listed below for reference.    Procedures and Diagnostic Studies:   CT HEAD WO CONTRAST ( ) Result Date: 03/09/2024 EXAM: CT HEAD WITHOUT 03/09/2024 05:31:35 AM TECHNIQUE: CT of the head was performed without the administration of intravenous contrast. Automated exposure control, iterative reconstruction, and/or weight based adjustment of the mA/kV was utilized to reduce the radiation dose to as low as reasonably achievable. COMPARISON: Prior study from 2024. CLINICAL HISTORY: 74 year old male with mental status change of unknown cause. FINDINGS: BRAIN AND VENTRICLES: No acute intracranial hemorrhage. No mass effect or midline shift. No extra-axial fluid collection. No evidence of acute  infarct. No hydrocephalus. Expected evolution of posterior left hemisphere, posterior left MCA / PCA watershed area infarcts since 2024. Background brain volume is stable. Cavum septum pellucidum, normal variant. Outside of the chronic encephalomalacia gray white differentiation is stable and within normal limits. Calcified atherosclerosis at the skull base. No suspicious intracranial vascular hyperdensity. ORBITS: No acute abnormality. SINUSES AND MASTOIDS: Paranasal sinuses, tympanic cavities and mastoids remain well aerated. SOFT TISSUES AND SKULL: No acute skull fracture. No acute soft tissue abnormality. IMPRESSION: 1. No acute intracranial abnormality. 2. Chronic left MCA/PCA watershed area infarct. Electronically signed by: Helayne Hurst MD 03/09/2024 05:42 AM EST RP Workstation: [id number]   CT CHEST ABDOMEN PELVIS W CONTRAST Result Date: 03/09/2024 EXAM: CT CHEST, ABDOMEN AND PELVIS WITH CONTRAST 03/09/2024 12:10:05 AM TECHNIQUE: CT of the chest, abdomen and pelvis was performed with the administration of 100 mL of iohexol  (OMNIPAQUE ) 300 MG/ML solution. Multiplanar reformatted images are provided for review. Automated exposure control, iterative reconstruction, and/or weight based adjustment of the mA/kV was utilized to reduce the radiation dose to as low as reasonably achievable. COMPARISON: AP portable chest 03/08/2024, CTA abdomen pelvis 10/27/2023, CT abdomen and pelvis with IV contrast 08/18/2023, and lung cancer screening chest CT without contrast 03/22/2022. CLINICAL HISTORY: 74 year old presents with sepsis workup, weakness for 1 week, fell twice today. Watery loose stool x2 today. No report of urinary symptoms. FINDINGS: CHEST: MEDIASTINUM AND LYMPH NODES: Heart: The coronary arteries are heavily calcified. Scattered calcifications and slight thickening noted across the aortic valve leaflets. No pericardial effusion. Vessels: The aorta and great vessels are heavily calcified. There is high-grade  calcific origin stenosis of the right subclavian artery and at least a 60% origin stenosis of the left subclavian artery. There is no aortic aneurysm, dissection, or stenosis. The pulmonary arteries and veins are normal caliber. Esophagus: There is mild thickening in the thoracic esophagus suggesting esophagitis. No mass-like thickening is evident. Lymph Nodes: There are shotty up to borderline-sized mediastinal and hilar lymph nodes with no interval change being seen. The axillae are clear. LUNGS AND PLEURA: Diaphragm: Mild chronic elevation again noted of the right hemidiaphragm. Lung Parenchyma: There is mild interlobular septal thickening in the lung apices not seen previously suggesting slight interstitial edema. 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 There is no active infiltrate. New nodules are noted with a subpleural 5 mm right upper lobe nodule posteriorly on series 5 axial 44, 3 mm right middle lobe nodule medially axial 91, 3 mm left upper lobe nodule on axial image 48, and a 2 mm left lower lobe nodule on image 77. Pleura: There are trace pleural effusions. No pneumothorax. BONES AND SOFT TISSUES (CHEST): There is osteopenia. Advanced degenerative disc disease mid to lower thoracic spine with spondylosis. Chronic mild upper plate wedge compression fracture of the T11 vertebral body. Recent but subacute nondisplaced fractures of the anterolateral left 6th and 7th ribs, small amount of callus at the fracture sites. The rib cage is otherwise intact. ABDOMEN AND PELVIS: LIVER: Unremarkable. GALLBLADDER AND BILE DUCTS: Gallbladder surgically absent as before without biliary dilatation. SPLEEN: No acute abnormality. PANCREAS: No acute abnormality. ADRENAL GLANDS: No acute abnormality. No adrenal mass. KIDNEYS,  URETERS AND BLADDER: Kidneys: Both initial and delayed images demonstrate patchy hypoenhancement over portions of both kidneys with adjacent stranding changes, findings consistent with bilateral pyelonephritis. There is a 2.4 cm Bosniak I cyst in the posterior right kidney. Per consensus, no follow-up is needed for simple Bosniak type 1 and 2 renal cysts, unless the patient has a malignancy history or risk factors. No renal mass. Ureters: There is no urinary stone or obstruction. No periureteral stranding. Bladder: The bladder is distended and otherwise unremarkable. GI AND BOWEL: Stomach: Mild fluid filling in the stomach without wall thickening. Duodenum: There is a 4 cm periampullary diverticulum of the descending duodenum. Small Bowel: The small bowel is of normal caliber. Appendix: The appendix is normal. Colon/Rectum: There is mucosal enhancement in the colon and rectum suggesting mild proctocolitis. Faint stranding changes are present over the distal descending segment. Other: There is no bowel obstruction. No pneumatosis. REPRODUCTIVE ORGANS: The prostate is not enlarged. PERITONEUM AND RETROPERITONEUM: No ascites. No free air. No free hemorrhage. There are small inguinal fat hernias. VASCULATURE: There is heavy aortic and branch vessel atherosclerosis, flow limiting stenoses in the common iliac and internal iliac arteries and distal external iliac arteries. ABDOMINAL AND PELVIS LYMPH NODES: There are chronic calcified retroperitoneal lymph nodes. No noncalcified adenopathy. BONES AND SOFT TISSUES (ABDOMEN/PELVIS): Soft Tissues: Chronic post-injection changes in the subcutaneous abdominal wall left greater than right. Osseous: There is osteopenia. Advanced lumbar degenerative changes from L3 down. Acquired spinal stenosis L3-L4 and L4-L5 with L5 and S1 decompression laminectomy previously performed. Transitional L5 noted with left hemisacralization. Chronic grade 1 anterolisthesis L3-L4. No acute or other  significant osseous findings. IMPRESSION: 1. Bilateral pyelonephritis. No urinary stone or obstruction. 2. Mild proctocolitis with faint stranding changes over the distal descending segment. 3. Recent but subacute nondisplaced fractures of the anterolateral left 6th and 7th ribs with small amount of callus at the fracture sites. 4. New pulmonary nodules. The largest is 5 mm in the right upper lobe. Continue with annual low dose lung cancer screening CT program . 5. Evidence consistent with slight interstitial edema in the apical lungs, there are trace pleural effusions. Noncardiogenic etiology favored. 6. Mild thickening in the esophagus consistent with esophagitis. 7. Subacute but recent-appearing fractures of the left anterolateral 6th and 7th ribs with minimal callus. . Electronically signed by: Francis Quam MD 03/09/2024 12:58 AM EST RP Workstation: [id number]   DG Tibia/Fibula Left Result Date: 03/08/2024 CLINICAL DATA:  Cellulitis weakness EXAM: LEFT TIBIA AND FIBULA - 2 VIEW COMPARISON:  None Available.  FINDINGS: No fracture or malalignment. No periostitis or osseous destructive change. Vascular stent posterior to the knee. Degenerative changes at the ankle IMPRESSION: No acute osseous abnormality. Electronically Signed   By: Luke Bun M.D.   On: 03/08/2024 23:44   DG Chest Port 1 View Result Date: 03/08/2024 EXAM: 1 VIEW(S) XRAY OF THE CHEST 03/08/2024 09:48:00 PM COMPARISON: Chest x-ray dated 10/30/2018. CLINICAL HISTORY: Questionable sepsis - evaluate for abnormality FINDINGS: LUNGS AND PLEURA: Low lung volumes. No pleural effusion. No pneumothorax. Interstitial opacities in the lower lungs worrisome for edema. HEART AND MEDIASTINUM: The heart is mildly enlarged. BONES AND SOFT TISSUES: No acute osseous abnormality. IMPRESSION: 1. Lower lung interstitial opacities, concerning for pulmonary edema. 2. Mild cardiomegaly. Electronically signed by: Greig Pique MD 03/08/2024 09:53 PM EST RP  Workstation: [id number]     Labs:   Basic Metabolic Panel: Recent Labs  Lab 03/08/24 2136 03/08/24 2152 03/09/24 0427 03/10/24 0518 03/11/24 0520 03/12/24 0530 03/13/24 0507  NA 133*   < > 136 137 137 137 138  K 4.2   < > 3.1* 3.5 3.2* 3.4* 3.9  CL 95*   < > 101 101 99 100 102  CO2 26  --  27 27 29 27 27   GLUCOSE 85   < > 62* 195* 180* 194* 198*  BUN 12   < > 11 9 6* 6* 7*  CREATININE 0.92   < > 0.72 0.62 0.67 0.63 0.69  CALCIUM  8.7*  --  8.1* 8.5* 8.4* 8.5* 8.7*  MG 1.7  --   --   --   --   --   --    < > = values in this interval not displayed.   GFR Estimated Creatinine Clearance: 84.4 mL/min (by C-G formula based on SCr of 0.69 mg/dL). Liver Function Tests: Recent Labs  Lab 03/08/24 2136 03/09/24 0427  AST 65* 42*  ALT 32 26  ALKPHOS 201* 148*  BILITOT 0.5 0.4  PROT 7.0 5.9*  ALBUMIN 3.0* 2.7*   No results for input(s): LIPASE, AMYLASE in the last 168 hours. No results for input(s): AMMONIA in the last 168 hours. Coagulation profile No results for input(s): INR, PROTIME in the last 168 hours.  CBC: Recent Labs  Lab 03/08/24 2136 03/08/24 2152 03/09/24 0427 03/10/24 0518 03/11/24 0520 03/12/24 0530 03/13/24 0507  WBC 14.2*  --  15.0* 10.9* 6.8 7.1 5.5  NEUTROABS 11.6*  --  12.8*  --  5.0 5.5 3.9  HGB 11.1*   < > 9.9* 10.7* 10.4* 11.0* 11.4*  HCT 34.3*   < > 31.0* 33.4* 33.2* 34.4* 36.9*  MCV 78.5*  --  79.7* 78.0* 78.9* 77.5* 78.8*  PLT 377  --  303 345 313 316 321   < > = values in this interval not displayed.   Cardiac Enzymes: No results for input(s): CKTOTAL, CKMB, CKMBINDEX, TROPONINI in the last 168 hours. BNP: Invalid input(s): POCBNP CBG: Recent Labs  Lab 03/12/24 1213 03/12/24 1805 03/12/24 2231 03/13/24 0731 03/13/24 1138  GLUCAP 274* 336* 241* 196* 206*   D-Dimer No results for input(s): DDIMER in the last 72 hours. Hgb A1c No results for input(s): HGBA1C in the last 72 hours. Lipid Profile No  results for input(s): CHOL, HDL, LDLCALC, TRIG, CHOLHDL, LDLDIRECT in the last 72 hours. Thyroid function studies No results for input(s): TSH, T4TOTAL, T3FREE, THYROIDAB in the last 72 hours.  Invalid input(s): FREET3 Anemia work up No results for input(s): VITAMINB12, FOLATE, FERRITIN, TIBC, IRON, RETICCTPCT  in the last 72 hours. Microbiology Recent Results (from the past 240 hours)  Urine Culture     Status: Abnormal   Collection Time: 03/08/24 12:18 AM   Specimen: Urine, Random  Result Value Ref Range Status   Specimen Description   Final    URINE, RANDOM Performed at [facility], [address] [place], [place] [address]    Special Requests   Final    NONE Reflexed from [id number] Performed at [facility], [address], [place], [place] [address]    Culture >=100,000 COLONIES/mL KLEBSIELLA OXYTOCA (A)  Final   Report Status 03/11/2024 FINAL  Final   Organism ID, Bacteria KLEBSIELLA OXYTOCA (A)  Final      Susceptibility   Klebsiella oxytoca - MIC*    AMPICILLIN  RESISTANT Resistant     CEFEPIME <=0.12 SENSITIVE Sensitive     ERTAPENEM <=0.12 SENSITIVE Sensitive     CEFTRIAXONE  <=0.25 SENSITIVE Sensitive     CIPROFLOXACIN  <=0.06 SENSITIVE Sensitive     GENTAMICIN <=1 SENSITIVE Sensitive     NITROFURANTOIN <=16 SENSITIVE Sensitive     TRIMETH /SULFA  <=20 SENSITIVE Sensitive     AMPICILLIN /SULBACTAM 4 SENSITIVE Sensitive     PIP/TAZO Value in next row Sensitive      <=4 SENSITIVEThis is a modified FDA-approved test that has been validated and its performance characteristics determined by the reporting laboratory.  This laboratory is certified under the Clinical Laboratory Improvement Amendments CLIA as qualified to perform high complexity clinical laboratory testing.    MEROPENEM Value in next row Sensitive      <=4 SENSITIVEThis is a modified FDA-approved test that has been validated and its performance  characteristics determined by the reporting laboratory.  This laboratory is certified under the Clinical Laboratory Improvement Amendments CLIA as qualified to perform high complexity clinical laboratory testing.    * >=100,000 COLONIES/mL KLEBSIELLA OXYTOCA  Blood Culture (routine x 2)     Status: None (Preliminary result)   Collection Time: 03/08/24  9:25 PM   Specimen: BLOOD RIGHT ARM  Result Value Ref Range Status   Specimen Description   Final    BLOOD RIGHT ARM Performed at [facility], [address] [place], [place] [address]    Special Requests   Final    BOTTLES DRAWN AEROBIC AND ANAEROBIC Blood Culture adequate volume Performed at [facility], [address], [place], [place] [address]    Culture   Final    NO GROWTH 4 DAYS Performed at [facility], [address], [place], [place] [address]    Report Status PENDING  Incomplete  Resp panel by RT-PCR (RSV, Flu A&B, Covid) Anterior Nasal Swab     Status: None   Collection Time: 03/08/24  9:36 PM   Specimen: Anterior Nasal Swab  Result Value Ref Range Status   SARS Coronavirus 2 by RT PCR NEGATIVE NEGATIVE Final    Comment: (NOTE) SARS-CoV-2 target nucleic acids are NOT DETECTED.  The SARS-CoV-2 RNA is generally detectable in upper respiratory specimens during the acute phase of infection. The lowest concentration of SARS-CoV-2 viral copies this assay can detect is 138 copies/mL. A negative result does not preclude SARS-Cov-2 infection and should not be used as the sole basis for treatment or other patient management decisions. A negative result may occur with  improper specimen collection/handling, submission of specimen other than nasopharyngeal swab, presence of viral mutation(s) within the areas targeted by this assay, and inadequate number of  viral copies(<138 copies/mL). A negative result must be combined with clinical observations, patient history, and  epidemiological information. The expected result is Negative.  Fact Sheet for Patients:  bloggercourse.com  Fact Sheet for Healthcare Providers:  seriousbroker.it  This test is no t yet approved or cleared by the [place]  FDA and  has been authorized for detection and/or diagnosis of SARS-CoV-2 by FDA under an Emergency Use Authorization (EUA). This EUA will remain  in effect (meaning this test can be used) for the duration of the COVID-19 declaration under Section 564(b)(1) of the Act, 21 U.S.C.section 360bbb-3(b)(1), unless the authorization is terminated  or revoked sooner.       Influenza A by PCR NEGATIVE NEGATIVE Final   Influenza B by PCR NEGATIVE NEGATIVE Final    Comment: (NOTE) The Xpert Xpress SARS-CoV-2/FLU/RSV plus assay is intended as an aid in the diagnosis of influenza from Nasopharyngeal swab specimens and should not be used as a sole basis for treatment. Nasal washings and aspirates are unacceptable for Xpert Xpress SARS-CoV-2/FLU/RSV testing.  Fact Sheet for Patients: bloggercourse.com  Fact Sheet for Healthcare Providers: seriousbroker.it  This test is not yet approved or cleared by the [place]  FDA and has been authorized for detection and/or diagnosis of SARS-CoV-2 by FDA under an Emergency Use Authorization (EUA). This EUA will remain in effect (meaning this test can be used) for the duration of the COVID-19 declaration under Section 564(b)(1) of the Act, 21 U.S.C. section 360bbb-3(b)(1), unless the authorization is terminated or revoked.     Resp Syncytial Virus by PCR NEGATIVE NEGATIVE Final    Comment: (NOTE) Fact Sheet for Patients: bloggercourse.com  Fact Sheet for Healthcare Providers: seriousbroker.it  This test is not yet approved or cleared by the [place]  FDA and has been  authorized for detection and/or diagnosis of SARS-CoV-2 by FDA under an Emergency Use Authorization (EUA). This EUA will remain in effect (meaning this test can be used) for the duration of the COVID-19 declaration under Section 564(b)(1) of the Act, 21 U.S.C. section 360bbb-3(b)(1), unless the authorization is terminated or revoked.  Performed at [facility], [address] [place], [place] [address]   Blood Culture (routine x 2)     Status: None (Preliminary result)   Collection Time: 03/08/24  9:45 PM   Specimen: BLOOD LEFT ARM  Result Value Ref Range Status   Specimen Description   Final    BLOOD LEFT ARM Performed at [facility], [address], [place], [place] [address]    Special Requests   Final    BOTTLES DRAWN AEROBIC AND ANAEROBIC Blood Culture results may not be optimal due to an inadequate volume of blood received in culture bottles Performed at [facility], [address], [place], [place] [address]    Culture   Final    NO GROWTH 4 DAYS Performed at [facility], [address] [place], [place] [address]    Report Status PENDING  Incomplete  C Difficile Quick Screen w PCR reflex     Status: Abnormal   Collection Time: 03/10/24 11:01 AM   Specimen: STOOL  Result Value Ref Range Status   C Diff antigen POSITIVE (A) NEGATIVE Final   C Diff toxin POSITIVE (A) NEGATIVE Final   C Diff interpretation Toxin producing C. difficile detected.  Final    Comment: CRITICAL RESULT CALLED TO, READ BACK BY AND VERIFIED WITH: SEBASTIAN DASEN, RN 03/10/24 @ 1223 BY [place], K  Performed at [facility], [address] [place], [place] [address]   Gastrointestinal Panel by PCR , Stool     Status: None   Collection Time: 03/10/24 11:01 AM   Specimen: STOOL  Result Value Ref Range Status   Campylobacter species NOT DETECTED NOT DETECTED Final   Plesimonas shigelloides NOT DETECTED NOT DETECTED Final   Salmonella  species NOT DETECTED NOT DETECTED Final   Yersinia enterocolitica NOT DETECTED NOT DETECTED Final   Vibrio species NOT DETECTED NOT DETECTED Final   Vibrio cholerae NOT DETECTED NOT DETECTED Final   Enteroaggregative E coli (EAEC) NOT DETECTED NOT DETECTED Final   Enteropathogenic E coli (EPEC) NOT DETECTED NOT DETECTED Final   Enterotoxigenic E coli (ETEC) NOT DETECTED NOT DETECTED Final   Shiga like toxin producing E coli (STEC) NOT DETECTED NOT DETECTED Final   Shigella/Enteroinvasive E coli (EIEC) NOT DETECTED NOT DETECTED Final   Cryptosporidium NOT DETECTED NOT DETECTED Final   Cyclospora cayetanensis NOT DETECTED NOT DETECTED Final   Entamoeba histolytica NOT DETECTED NOT DETECTED Final   Giardia lamblia NOT DETECTED NOT DETECTED Final   Adenovirus F40/41 NOT DETECTED NOT DETECTED Final   Astrovirus NOT DETECTED NOT DETECTED Final   Norovirus GI/GII NOT DETECTED NOT DETECTED Final   Rotavirus A NOT DETECTED NOT DETECTED Final   Sapovirus (I, II, IV, and V) NOT DETECTED NOT DETECTED Final    Comment: Performed at [facility], [address] [place], [place] [address]    Time coordinating discharge: 45 minutes  Signed: Chapman Junnie Loschiavo  [facility] 03/13/2024, 11:46 AM   "

## 2024-03-14 LAB — CULTURE, BLOOD (ROUTINE X 2)
Culture: NO GROWTH
Culture: NO GROWTH
Special Requests: ADEQUATE

## 2024-03-15 ENCOUNTER — Other Ambulatory Visit (HOSPITAL_COMMUNITY): Payer: Self-pay

## 2024-03-15 ENCOUNTER — Other Ambulatory Visit: Payer: Self-pay

## 2024-04-01 ENCOUNTER — Ambulatory Visit: Payer: Medicare (Managed Care) | Admitting: Podiatry

## 2024-05-03 ENCOUNTER — Ambulatory Visit (HOSPITAL_COMMUNITY): Payer: Medicare (Managed Care)

## 2024-05-03 ENCOUNTER — Ambulatory Visit: Payer: Medicare (Managed Care)

## 2024-05-03 ENCOUNTER — Ambulatory Visit: Payer: Medicare (Managed Care) | Admitting: Podiatry

## 2024-05-10 ENCOUNTER — Ambulatory Visit: Payer: Medicare (Managed Care)
